# Patient Record
Sex: Male | Born: 1945 | Race: White | Hispanic: No | Smoking: Current some day smoker
Health system: Southern US, Community
[De-identification: ages and names within clinical notes are randomized; demographics above are authoritative.]

## PROBLEM LIST (undated history)

## (undated) DIAGNOSIS — Z940 Kidney transplant status: Secondary | ICD-10-CM

## (undated) DIAGNOSIS — E785 Hyperlipidemia, unspecified: Secondary | ICD-10-CM

## (undated) DIAGNOSIS — N189 Chronic kidney disease, unspecified: Secondary | ICD-10-CM

## (undated) DIAGNOSIS — I10 Essential (primary) hypertension: Secondary | ICD-10-CM

## (undated) DIAGNOSIS — E119 Type 2 diabetes mellitus without complications: Secondary | ICD-10-CM

## (undated) HISTORY — DX: Chronic kidney disease, unspecified: N18.9

## (undated) HISTORY — DX: Hyperlipidemia, unspecified: E78.5

## (undated) HISTORY — DX: Essential (primary) hypertension: I10

## (undated) HISTORY — DX: Type 2 diabetes mellitus without complications: E11.9

## (undated) HISTORY — PX: KIDNEY TRANSPLANT: SHX239

---

## 2003-06-11 ENCOUNTER — Ambulatory Visit (HOSPITAL_COMMUNITY): Admission: RE | Admit: 2003-06-11 | Discharge: 2003-06-12 | Payer: Self-pay | Admitting: Ophthalmology

## 2003-07-25 ENCOUNTER — Ambulatory Visit (HOSPITAL_COMMUNITY): Admission: RE | Admit: 2003-07-25 | Discharge: 2003-07-26 | Payer: Self-pay | Admitting: Ophthalmology

## 2004-07-07 ENCOUNTER — Inpatient Hospital Stay (HOSPITAL_COMMUNITY): Admission: EM | Admit: 2004-07-07 | Discharge: 2004-07-16 | Payer: Self-pay | Admitting: Emergency Medicine

## 2004-07-08 ENCOUNTER — Ambulatory Visit: Payer: Self-pay | Admitting: Cardiology

## 2004-08-10 ENCOUNTER — Ambulatory Visit (HOSPITAL_COMMUNITY): Admission: RE | Admit: 2004-08-10 | Discharge: 2004-08-11 | Payer: Self-pay | Admitting: Nephrology

## 2004-08-20 ENCOUNTER — Ambulatory Visit: Payer: Self-pay | Admitting: Cardiology

## 2004-12-21 ENCOUNTER — Ambulatory Visit (HOSPITAL_COMMUNITY): Admission: RE | Admit: 2004-12-21 | Discharge: 2004-12-21 | Payer: Self-pay | Admitting: Vascular Surgery

## 2005-07-30 ENCOUNTER — Inpatient Hospital Stay (HOSPITAL_COMMUNITY): Admission: EM | Admit: 2005-07-30 | Discharge: 2005-08-04 | Payer: Self-pay | Admitting: Emergency Medicine

## 2005-12-23 ENCOUNTER — Ambulatory Visit (HOSPITAL_COMMUNITY): Admission: RE | Admit: 2005-12-23 | Discharge: 2005-12-23 | Payer: Self-pay | Admitting: Nephrology

## 2006-05-12 ENCOUNTER — Ambulatory Visit (HOSPITAL_COMMUNITY): Admission: RE | Admit: 2006-05-12 | Discharge: 2006-05-12 | Payer: Self-pay | Admitting: Nephrology

## 2010-03-01 ENCOUNTER — Encounter: Payer: Self-pay | Admitting: Nephrology

## 2010-03-02 ENCOUNTER — Encounter: Payer: Self-pay | Admitting: Nephrology

## 2010-08-05 ENCOUNTER — Other Ambulatory Visit: Payer: Self-pay | Admitting: Dermatology

## 2010-08-05 DIAGNOSIS — C4492 Squamous cell carcinoma of skin, unspecified: Secondary | ICD-10-CM

## 2010-08-05 HISTORY — DX: Squamous cell carcinoma of skin, unspecified: C44.92

## 2011-03-24 DIAGNOSIS — Z7960 Long term (current) use of unspecified immunomodulators and immunosuppressants: Secondary | ICD-10-CM | POA: Insufficient documentation

## 2011-11-08 ENCOUNTER — Other Ambulatory Visit: Payer: Self-pay | Admitting: Dermatology

## 2012-03-30 DIAGNOSIS — B259 Cytomegaloviral disease, unspecified: Secondary | ICD-10-CM | POA: Insufficient documentation

## 2012-03-30 DIAGNOSIS — Z9289 Personal history of other medical treatment: Secondary | ICD-10-CM | POA: Insufficient documentation

## 2013-06-27 ENCOUNTER — Other Ambulatory Visit: Payer: Self-pay | Admitting: Dermatology

## 2013-06-27 DIAGNOSIS — C4491 Basal cell carcinoma of skin, unspecified: Secondary | ICD-10-CM

## 2013-06-27 HISTORY — DX: Basal cell carcinoma of skin, unspecified: C44.91

## 2013-08-16 ENCOUNTER — Other Ambulatory Visit: Payer: Self-pay | Admitting: Dermatology

## 2013-10-24 ENCOUNTER — Other Ambulatory Visit: Payer: Self-pay | Admitting: Dermatology

## 2014-07-01 ENCOUNTER — Other Ambulatory Visit: Payer: Self-pay | Admitting: Dermatology

## 2014-08-22 ENCOUNTER — Other Ambulatory Visit: Payer: Self-pay | Admitting: Dermatology

## 2014-10-10 ENCOUNTER — Other Ambulatory Visit: Payer: Self-pay | Admitting: Dermatology

## 2015-02-11 ENCOUNTER — Other Ambulatory Visit: Payer: Self-pay | Admitting: Dermatology

## 2015-03-13 ENCOUNTER — Other Ambulatory Visit: Payer: Self-pay | Admitting: Dermatology

## 2015-09-30 ENCOUNTER — Other Ambulatory Visit: Payer: Self-pay | Admitting: Dermatology

## 2015-10-07 DIAGNOSIS — Z79899 Other long term (current) drug therapy: Secondary | ICD-10-CM | POA: Insufficient documentation

## 2015-10-07 DIAGNOSIS — Z940 Kidney transplant status: Secondary | ICD-10-CM | POA: Insufficient documentation

## 2015-10-29 DIAGNOSIS — E1165 Type 2 diabetes mellitus with hyperglycemia: Secondary | ICD-10-CM | POA: Insufficient documentation

## 2015-11-13 ENCOUNTER — Other Ambulatory Visit: Payer: Self-pay | Admitting: Dermatology

## 2016-05-04 DIAGNOSIS — E1121 Type 2 diabetes mellitus with diabetic nephropathy: Secondary | ICD-10-CM | POA: Insufficient documentation

## 2016-06-14 ENCOUNTER — Other Ambulatory Visit: Payer: Self-pay | Admitting: Dermatology

## 2016-06-25 LAB — HM HEPATITIS C SCREENING LAB: HM Hepatitis Screen: NEGATIVE

## 2016-07-08 ENCOUNTER — Other Ambulatory Visit: Payer: Self-pay | Admitting: Dermatology

## 2017-02-22 ENCOUNTER — Other Ambulatory Visit: Payer: Self-pay | Admitting: Dermatology

## 2017-03-17 ENCOUNTER — Other Ambulatory Visit: Payer: Self-pay | Admitting: Dermatology

## 2018-04-11 ENCOUNTER — Other Ambulatory Visit: Payer: Self-pay | Admitting: Dermatology

## 2018-08-01 ENCOUNTER — Ambulatory Visit: Payer: Self-pay | Admitting: Family Medicine

## 2018-09-28 ENCOUNTER — Other Ambulatory Visit: Payer: Self-pay | Admitting: Dermatology

## 2018-10-09 ENCOUNTER — Ambulatory Visit: Payer: Medicare Other | Admitting: Family Medicine

## 2018-11-21 ENCOUNTER — Other Ambulatory Visit: Payer: Self-pay

## 2018-11-22 ENCOUNTER — Encounter: Payer: Self-pay | Admitting: Family Medicine

## 2018-11-22 ENCOUNTER — Ambulatory Visit (INDEPENDENT_AMBULATORY_CARE_PROVIDER_SITE_OTHER): Payer: Medicare Other | Admitting: Family Medicine

## 2018-11-22 VITALS — BP 138/66 | HR 81 | Temp 97.8°F | Ht 70.0 in | Wt 205.8 lb

## 2018-11-22 DIAGNOSIS — E782 Mixed hyperlipidemia: Secondary | ICD-10-CM

## 2018-11-22 DIAGNOSIS — H547 Unspecified visual loss: Secondary | ICD-10-CM | POA: Diagnosis not present

## 2018-11-22 DIAGNOSIS — I10 Essential (primary) hypertension: Secondary | ICD-10-CM

## 2018-11-22 DIAGNOSIS — E871 Hypo-osmolality and hyponatremia: Secondary | ICD-10-CM

## 2018-11-22 DIAGNOSIS — E119 Type 2 diabetes mellitus without complications: Secondary | ICD-10-CM | POA: Diagnosis not present

## 2018-11-22 DIAGNOSIS — Z23 Encounter for immunization: Secondary | ICD-10-CM

## 2018-11-22 DIAGNOSIS — J301 Allergic rhinitis due to pollen: Secondary | ICD-10-CM

## 2018-11-22 DIAGNOSIS — Z940 Kidney transplant status: Secondary | ICD-10-CM | POA: Insufficient documentation

## 2018-11-22 DIAGNOSIS — E785 Hyperlipidemia, unspecified: Secondary | ICD-10-CM | POA: Insufficient documentation

## 2018-11-22 DIAGNOSIS — Z794 Long term (current) use of insulin: Secondary | ICD-10-CM | POA: Insufficient documentation

## 2018-11-22 MED ORDER — FEXOFENADINE-PSEUDOEPHED ER 180-240 MG PO TB24: 1.0000 | ORAL_TABLET | Freq: Every evening | ORAL | 11 refills | Status: DC

## 2018-11-22 NOTE — Progress Notes (Signed)
Subjective:  Patient ID: Daniel Myers,  male    DOB: 07/17/1945  Age: 73 y.o.    CC: New Patient (Initial Visit) (Dr. Edrick Oh office )   HPI [name] presents for  follow-up of hypertension. Patient has no history of headache chest pain or shortness of breath or recent cough. Patient also denies symptoms of TIA such as numbness weakness lateralizing. Patient denies side effects from medication. States taking it regularly.  Renal transplant in 2008. Followed at [facility], Dr. Joseph Berkshire. Last chemistries showed Na was 130. Pt. Denies edema, dyspnea.  Patient also  in for follow-up of elevated cholesterol. Doing well without complaints on current medication. Denies side effects  including myalgia and arthralgia and nausea. Also in today for liver function testing. Currently no chest pain, shortness of breath or other cardiovascular related symptoms noted..   Follow-up of diabetes. Patient does check blood sugar at home. Readings run a little high lately. A1c per care everywhere was 7.9  On Sept. 3. Patient denies symptoms such as excessive hunger or urinary frequency, excessive hunger, nausea No significant hypoglycemic spells noted. Medications reviewed. Pt reports taking them regularly. Pt. denies complication/adverse reaction today.    History [name] has a past medical history of Chronic kidney disease, Diabetes mellitus without complication ([name]), Hyperlipidemia, and Hypertension.   He has a past surgical history that includes Kidney transplant (Right).   His family history includes Clotting disorder in his mother; Diabetes in his sister; Hypertension in his sister.He reports that he has been smoking pipe. He has never used smokeless tobacco. He reports that he does not drink alcohol or use drugs.  Current Outpatient Medications on File Prior to Visit  Medication Sig Dispense Refill  . amLODipine (NORVASC) 10 MG tablet Take 10 mg by mouth daily.    Marland Kitchen aspirin EC 81 MG tablet Take by  mouth.    . fluticasone (FLONASE) 50 MCG/ACT nasal spray USE ONE SPRAY(S) IN EACH NOSTRIL ONCE DAILY    . furosemide (LASIX) 40 MG tablet Take 40 mg by mouth daily.    . insulin aspart (NOVOLOG) 100 UNIT/ML injection Inject 15 Units into the skin 3 (three) times daily with meals.    . insulin glargine (LANTUS) 100 UNIT/ML injection Inject 60 Units into the skin.    Marland Kitchen loratadine (CLARITIN) 10 MG tablet Take by mouth.    . mycophenolate (CELLCEPT) 250 MG capsule TAKE 2 CAPSULES BY MOUTH TWO TIMES DAILY.    Marland Kitchen omega-3 acid ethyl esters (LOVAZA) 1 g capsule Take by mouth.    . pravastatin (PRAVACHOL) 80 MG tablet TAKE 1 TABLET BY MOUTH ONCE DAILY IN THE MORNING    . predniSONE (DELTASONE) 5 MG tablet Take 5 mg by mouth daily.    Marland Kitchen RELION INSULIN SYRINGE 31G X 15/64" 0.5 ML MISC USE AS DIRECTED FOR INSULIN    . sodium bicarbonate 650 MG tablet Take 650 mg by mouth 2 (two) times daily.    Marland Kitchen sulfamethoxazole-trimethoprim (BACTRIM) 400-80 MG tablet TAKE 1 TABLET BY MOUTH THREE TIMES A WEEK    . tacrolimus (PROGRAF) 0.5 MG capsule TAKE 2 CAPSULES BY MOUTH TWO TIMES DAILY.     No current facility-administered medications on file prior to visit.     ROS Review of Systems  Constitutional: Negative.   HENT: Negative.   Eyes: Positive for visual disturbance.  Respiratory: Negative for cough and shortness of breath.   Cardiovascular: Negative for chest pain and leg swelling.  Gastrointestinal: Negative for  abdominal pain, diarrhea, nausea and vomiting.  Genitourinary: Negative for difficulty urinating.  Musculoskeletal: Negative for arthralgias and myalgias.  Skin: Negative for rash.  Neurological: Negative for headaches.  Psychiatric/Behavioral: Negative for sleep disturbance.    Objective:  BP 138/66   Pulse 81   Temp 97.8 F (36.6 C) (Temporal)   Ht '5\' 10"'  (1.778 m)   Wt 205 lb 12.8 oz (93.4 kg)   SpO2 97%   BMI 29.53 kg/m   BP Readings from Last 3 Encounters:  11/22/18 138/66     Wt Readings from Last 3 Encounters:  11/22/18 205 lb 12.8 oz (93.4 kg)     Physical Exam Constitutional:      General: He is not in acute distress.    Appearance: He is well-developed.  HENT:     Head: Normocephalic and atraumatic.     Right Ear: External ear normal.     Left Ear: External ear normal.     Nose: Nose normal.  Eyes:     Conjunctiva/sclera: Conjunctivae normal.     Pupils: Pupils are equal, round, and reactive to light.  Neck:     Musculoskeletal: Normal range of motion and neck supple.  Cardiovascular:     Rate and Rhythm: Normal rate and regular rhythm.     Heart sounds: Normal heart sounds. No murmur.  Pulmonary:     Effort: Pulmonary effort is normal. No respiratory distress.     Breath sounds: Normal breath sounds. No wheezing or rales.  Abdominal:     Palpations: Abdomen is soft.     Tenderness: There is no abdominal tenderness.  Musculoskeletal: Normal range of motion.  Skin:    General: Skin is warm and dry.  Neurological:     Mental Status: He is alert and oriented to person, place, and time.     Deep Tendon Reflexes: Reflexes are normal and symmetric.  Psychiatric:        Behavior: Behavior normal.        Thought Content: Thought content normal.        Judgment: Judgment normal.     Diabetic Foot Exam - Simple   No data filed        Assessment & Plan:   Unique was seen today for new patient (initial visit).  Diagnoses and all orders for this visit:  Insulin dependent type 2 diabetes mellitus ([place])  Need for immunization against influenza -     Flu Vaccine QUAD High Dose(Fluad)  Essential hypertension  Renal transplant recipient  Mixed hyperlipidemia  Vision decreased  Hyponatremia -     BMP8+EGFR -     Osmolality  Seasonal allergic rhinitis due to pollen  Other orders -     fexofenadine-pseudoephedrine (ALLEGRA-D 24) 180-240 MG 24 hr tablet; Take 1 tablet by mouth every evening. For allergy and congestion   I am  having [name] start on fexofenadine-pseudoephedrine. I am also having him maintain his tacrolimus, sulfamethoxazole-trimethoprim, sodium bicarbonate, predniSONE, pravastatin, mycophenolate, insulin glargine, furosemide, aspirin EC, amLODipine, fluticasone, ReliOn Insulin Syringe, loratadine, omega-3 acid ethyl esters, and insulin aspart.  Meds ordered this encounter  Medications  . fexofenadine-pseudoephedrine (ALLEGRA-D 24) 180-240 MG 24 hr tablet    Sig: Take 1 tablet by mouth every evening. For allergy and congestion    Dispense:  30 tablet    Refill:  11     Follow-up: Return in about 2 months (around 01/22/2019).  Claretta Fraise, M.D.

## 2018-11-23 LAB — BMP8+EGFR
BUN/Creatinine Ratio: 19 (ref 10–24)
BUN: 26 mg/dL (ref 8–27)
CO2: 21 mmol/L (ref 20–29)
Calcium: 9 mg/dL (ref 8.6–10.2)
Chloride: 95 mmol/L — ABNORMAL LOW (ref 96–106)
Creatinine, Ser: 1.4 mg/dL — ABNORMAL HIGH (ref 0.76–1.27)
GFR calc Af Amer: 58 mL/min/{1.73_m2} — ABNORMAL LOW (ref >59)
GFR calc non Af Amer: 50 mL/min/{1.73_m2} — ABNORMAL LOW (ref >59)
Glucose: 335 mg/dL — ABNORMAL HIGH (ref 65–99)
Potassium: 4 mmol/L (ref 3.5–5.2)
Sodium: 132 mmol/L — ABNORMAL LOW (ref 134–144)

## 2018-11-23 LAB — OSMOLALITY: Osmolality Meas: 297 mOsmol/kg (ref 280–301)

## 2019-01-19 ENCOUNTER — Other Ambulatory Visit: Payer: Self-pay

## 2019-01-22 ENCOUNTER — Encounter: Payer: Self-pay | Admitting: Family Medicine

## 2019-01-22 ENCOUNTER — Ambulatory Visit (INDEPENDENT_AMBULATORY_CARE_PROVIDER_SITE_OTHER): Payer: Medicare Other | Admitting: Family Medicine

## 2019-01-22 DIAGNOSIS — Z794 Long term (current) use of insulin: Secondary | ICD-10-CM

## 2019-01-22 DIAGNOSIS — E119 Type 2 diabetes mellitus without complications: Secondary | ICD-10-CM

## 2019-01-22 DIAGNOSIS — I10 Essential (primary) hypertension: Secondary | ICD-10-CM | POA: Diagnosis not present

## 2019-01-22 MED ORDER — INSULIN REGULAR HUMAN 100 UNIT/ML IJ SOPN: 10.0000 [IU] | PEN_INJECTOR | Freq: Two times a day (BID) | INTRAMUSCULAR | 99 refills | Status: DC

## 2019-01-22 MED ORDER — INSULIN ASPART 100 UNIT/ML ~~LOC~~ SOLN: SUBCUTANEOUS | 10 refills | Status: DC

## 2019-01-22 MED ORDER — INSULIN NPH (HUMAN) (ISOPHANE) 100 UNIT/ML ~~LOC~~ SUSP: SUBCUTANEOUS | 11 refills | Status: DC

## 2019-01-22 NOTE — Progress Notes (Signed)
Subjective:    Patient ID: Daniel Myers, male    DOB: Oct 02, 1945, 73 y.o.   MRN: [id number]   HPI: Daniel Myers is a 73 y.o. male presenting for presents forFollow-up of diabetes. Patient checks blood sugar at home.  70-125 fasting and 215-300 postprandial. Will get to low if he goes over 60 units at bedtime. No longer taking lantus due to price. Instead taking NPH at bedtime. HE also uses regular insulin from [employer] 10 units AC, breakfast and supper with prn use at lunchtime.  Patient denies symptoms such as polyuria, polydipsia, excessive hunger, nausea No significant hypoglycemic spells noted. Medications reviewed. Pt reports taking them regularly without complication/adverse reaction being reported today.      No flowsheet data found.   Relevant past medical, surgical, family and social history reviewed and updated as indicated.  Interim medical history since our last visit reviewed. Allergies and medications reviewed and updated.  ROS:  Review of Systems  Constitutional: Negative for fever.  Respiratory: Negative for shortness of breath.   Cardiovascular: Negative for chest pain.  Musculoskeletal: Positive for arthralgias.  Skin: Negative for rash.     Social History   Tobacco Use  Smoking Status Current Some Day Smoker  . Types: Pipe  Smokeless Tobacco Never Used       Objective:     Wt Readings from Last 3 Encounters:  11/22/18 205 lb 12.8 oz (93.4 kg)     Exam deferred. Pt. Harboring due to COVID 19. Phone visit performed.   Assessment & Plan:   1. Insulin dependent type 2 diabetes mellitus ([place])   2. Essential hypertension     Meds ordered this encounter  Medications  . DISCONTD: insulin aspart (NOVOLOG) 100 UNIT/ML injection    Sig: 10 units AC breakfast and supper. Also PRN at lunch if over 200    Dispense:  10 mL    Refill:  10  . insulin NPH Human (NOVOLIN N) 100 UNIT/ML injection    Sig: 30 units sq AC breakfast, 40 units sq ac supper     Dispense:  30 mL    Refill:  11  . Insulin Regular Human 100 UNIT/ML SOPN    Sig: Inject 10 Units as directed 2 (two) times daily before lunch and supper. r. Also PRN at lunch if glucose is over 200    Dispense:  10 mL    Refill:  prn    Check P at home. Goal of 135/85 or below.    Diagnoses and all orders for this visit:  Insulin dependent type 2 diabetes mellitus ([place])  Essential hypertension  Other orders -     Discontinue: insulin aspart (NOVOLOG) 100 UNIT/ML injection; 10 units AC breakfast and supper. Also PRN at lunch if over 200 -     insulin NPH Human (NOVOLIN N) 100 UNIT/ML injection; 30 units sq AC breakfast, 40 units sq ac supper -     Insulin Regular Human 100 UNIT/ML SOPN; Inject 10 Units as directed 2 (two) times daily before lunch and supper. r. Also PRN at lunch if glucose is over 200    Virtual Visit via telephone Note  I discussed the limitations, risks, security and privacy concerns of performing an evaluation and management service by telephone and the availability of in person appointments. The patient was identified with two identifiers. Pt.expressed understanding and agreed to proceed. Pt. Is at home. Dr. Livia Snellen is in his office.  Follow Up Instructions:   I  discussed the assessment and treatment plan with the patient. The patient was provided an opportunity to ask questions and all were answered. The patient agreed with the plan and demonstrated an understanding of the instructions.   The patient was advised to call back or seek an in-person evaluation if the symptoms worsen or if the condition fails to improve as anticipated.   Total minutes including chart review and phone contact time: 21   Follow up plan: Return in about 1 month (around 02/22/2019).  Claretta Fraise, MD [place]

## 2019-03-13 ENCOUNTER — Other Ambulatory Visit: Payer: Self-pay

## 2019-03-14 ENCOUNTER — Ambulatory Visit (INDEPENDENT_AMBULATORY_CARE_PROVIDER_SITE_OTHER): Payer: Medicare Other | Admitting: Family Medicine

## 2019-03-14 ENCOUNTER — Encounter: Payer: Self-pay | Admitting: Family Medicine

## 2019-03-14 VITALS — BP 146/52 | Temp 97.0°F | Ht 70.0 in | Wt 202.0 lb

## 2019-03-14 DIAGNOSIS — E871 Hypo-osmolality and hyponatremia: Secondary | ICD-10-CM

## 2019-03-14 DIAGNOSIS — I10 Essential (primary) hypertension: Secondary | ICD-10-CM

## 2019-03-14 DIAGNOSIS — Z794 Long term (current) use of insulin: Secondary | ICD-10-CM

## 2019-03-14 DIAGNOSIS — E119 Type 2 diabetes mellitus without complications: Secondary | ICD-10-CM

## 2019-03-14 DIAGNOSIS — E782 Mixed hyperlipidemia: Secondary | ICD-10-CM | POA: Diagnosis not present

## 2019-03-14 DIAGNOSIS — Z940 Kidney transplant status: Secondary | ICD-10-CM

## 2019-03-14 DIAGNOSIS — J301 Allergic rhinitis due to pollen: Secondary | ICD-10-CM

## 2019-03-14 LAB — BAYER DCA HB A1C WAIVED: HB A1C (BAYER DCA - WAIVED): 6.4 % (ref <7.0)

## 2019-03-14 MED ORDER — PRAVASTATIN SODIUM 80 MG PO TABS: ORAL_TABLET | ORAL | 3 refills | Status: DC

## 2019-03-14 MED ORDER — AMLODIPINE BESYLATE 10 MG PO TABS: 10.0000 mg | ORAL_TABLET | Freq: Every day | ORAL | 1 refills | Status: DC

## 2019-03-14 MED ORDER — INSULIN NPH (HUMAN) (ISOPHANE) 100 UNIT/ML ~~LOC~~ SUSP: SUBCUTANEOUS | 11 refills | Status: DC

## 2019-03-14 MED ORDER — OMEGA-3-ACID ETHYL ESTERS 1 G PO CAPS: 2.0000 g | ORAL_CAPSULE | Freq: Two times a day (BID) | ORAL | 3 refills | Status: DC

## 2019-03-14 MED ORDER — INSULIN REGULAR HUMAN 100 UNIT/ML IJ SOPN: 10.0000 [IU] | PEN_INJECTOR | Freq: Two times a day (BID) | INTRAMUSCULAR | 99 refills | Status: DC

## 2019-03-14 MED ORDER — FEXOFENADINE-PSEUDOEPHED ER 180-240 MG PO TB24: 1.0000 | ORAL_TABLET | Freq: Every evening | ORAL | 11 refills | Status: DC

## 2019-03-14 MED ORDER — FLUTICASONE PROPIONATE 50 MCG/ACT NA SUSP: NASAL | 11 refills | Status: DC

## 2019-03-14 NOTE — Progress Notes (Signed)
Subjective:  Patient ID: Daniel Myers,  male    DOB: [birth year]  Age: 74 y.o.    CC: Follow-up   HPI DARIUSZ BRASE presents for  follow-up of hypertension. Patient has no history of headache chest pain or shortness of breath or recent cough. Patient also denies symptoms of TIA such as numbness weakness lateralizing. Patient denies side effects from medication. States taking it regularly.  Patient also  in for follow-up of elevated cholesterol. Doing well without complaints on current medication. Denies side effects  including myalgia and arthralgia and nausea. Also in today for liver function testing. Currently no chest pain, shortness of breath or other cardiovascular related symptoms noted.  Follow-up of diabetes. Patient does check blood sugar at home. LOgs show 90-120 for fasting and 150-200 for PP. Sometimes higher, occasioanlly to 300 at bedtime. Patient denies symptoms such as excessive hunger or urinary frequency, excessive hunger, nausea No significant hypoglycemic spells noted. Medications reviewed. Pt reports taking them regularly. Pt. denies complication/adverse reaction today.    History Zakariye has a past medical history of Chronic kidney disease, Diabetes mellitus without complication ([place]), Hyperlipidemia, and Hypertension.   He has a past surgical history that includes Kidney transplant (Right).   His family history includes Clotting disorder in his mother; Diabetes in his sister; Hypertension in his sister.He reports that he has been smoking pipe. He has never used smokeless tobacco. He reports that he does not drink alcohol or use drugs.  Current Outpatient Medications on File Prior to Visit  Medication Sig Dispense Refill  . aspirin EC 81 MG tablet Take by mouth.    . furosemide (LASIX) 40 MG tablet Take 40 mg by mouth daily.    Marland Kitchen loratadine (CLARITIN) 10 MG tablet Take by mouth.    . mycophenolate (CELLCEPT) 250 MG capsule TAKE 2 CAPSULES BY MOUTH TWO TIMES  DAILY.    Marland Kitchen predniSONE (DELTASONE) 5 MG tablet Take 5 mg by mouth daily.    Marland Kitchen RELION INSULIN SYRINGE 31G X 15/64" 0.5 ML MISC USE AS DIRECTED FOR INSULIN    . sodium bicarbonate 650 MG tablet Take 650 mg by mouth 2 (two) times daily.    Marland Kitchen sulfamethoxazole-trimethoprim (BACTRIM) 400-80 MG tablet TAKE 1 TABLET BY MOUTH THREE TIMES A WEEK    . tacrolimus (PROGRAF) 0.5 MG capsule TAKE 2 CAPSULES BY MOUTH TWO TIMES DAILY.     No current facility-administered medications on file prior to visit.    ROS Review of Systems  Constitutional: Negative.   HENT: Negative.   Eyes: Negative for visual disturbance.  Respiratory: Negative for cough and shortness of breath.   Cardiovascular: Negative for chest pain and leg swelling.  Gastrointestinal: Negative for abdominal pain, diarrhea, nausea and vomiting.  Genitourinary: Negative for difficulty urinating.  Musculoskeletal: Negative for arthralgias and myalgias.  Skin: Negative for rash.  Neurological: Negative for headaches.  Psychiatric/Behavioral: Negative for sleep disturbance.    Objective:  BP (!) 146/52   Temp (!) 97 F (36.1 C) (Temporal)   Ht '5\' 10"'  (1.778 m)   Wt 202 lb (91.6 kg)   BMI 28.98 kg/m   BP Readings from Last 3 Encounters:  03/14/19 (!) 146/52  11/22/18 138/66    Wt Readings from Last 3 Encounters:  03/14/19 202 lb (91.6 kg)  11/22/18 205 lb 12.8 oz (93.4 kg)     Physical Exam Constitutional:      General: He is not in acute distress.    Appearance: He is  well-developed.  HENT:     Head: Normocephalic and atraumatic.     Right Ear: External ear normal.     Left Ear: External ear normal.     Nose: Nose normal.  Eyes:     Conjunctiva/sclera: Conjunctivae normal.     Pupils: Pupils are equal, round, and reactive to light.  Cardiovascular:     Rate and Rhythm: Normal rate and regular rhythm.     Heart sounds: Normal heart sounds. No murmur.  Pulmonary:     Effort: Pulmonary effort is normal. No  respiratory distress.     Breath sounds: Normal breath sounds. No wheezing or rales.  Abdominal:     Palpations: Abdomen is soft.     Tenderness: There is no abdominal tenderness.  Musculoskeletal:        General: Normal range of motion.     Cervical back: Normal range of motion and neck supple.  Skin:    General: Skin is warm and dry.  Neurological:     Mental Status: He is alert and oriented to person, place, and time.     Deep Tendon Reflexes: Reflexes are normal and symmetric.  Psychiatric:        Behavior: Behavior normal.        Thought Content: Thought content normal.        Judgment: Judgment normal.     Diabetic Foot Exam - Simple   No data filed        Assessment & Plan:   Eugune was seen today for follow-up.  Diagnoses and all orders for this visit:  Insulin dependent type 2 diabetes mellitus ([place]) -     Bayer DCA Hb A1c Waived -     CBC with Differential/Platelet -     CMP14+EGFR -     Microalbumin / creatinine urine ratio -     insulin NPH Human (NOVOLIN N) 100 UNIT/ML injection; 30 units sq AC breakfast, 40 units sq ac supper -     Insulin Regular Human 100 UNIT/ML SOPN; Inject 10 Units as directed 2 (two) times daily before lunch and supper. Inject 10 units before lunch and 13 units before supper. -     pravastatin (PRAVACHOL) 80 MG tablet; TAKE 1 TABLET BY MOUTH ONCE DAILY IN THE MORNING  Essential hypertension -     Bayer DCA Hb A1c Waived -     CBC with Differential/Platelet -     CMP14+EGFR -     Microalbumin / creatinine urine ratio -     amLODipine (NORVASC) 10 MG tablet; Take 1 tablet (10 mg total) by mouth daily.  Mixed hyperlipidemia -     Bayer DCA Hb A1c Waived -     CBC with Differential/Platelet -     CMP14+EGFR -     Lipid panel -     omega-3 acid ethyl esters (LOVAZA) 1 g capsule; Take 2 capsules (2 g total) by mouth 2 (two) times daily. -     pravastatin (PRAVACHOL) 80 MG tablet; TAKE 1 TABLET BY MOUTH ONCE DAILY IN THE  MORNING  Hyponatremia -     CBC with Differential/Platelet -     CMP14+EGFR  Renal transplant recipient  Seasonal allergic rhinitis due to pollen -     fexofenadine-pseudoephedrine (ALLEGRA-D 24) 180-240 MG 24 hr tablet; Take 1 tablet by mouth every evening. For allergy and congestion -     fluticasone (FLONASE) 50 MCG/ACT nasal spray; USE ONE SPRAY(S) IN EACH NOSTRIL ONCE DAILY  I have changed Lesean L. Servidio's amLODipine, Insulin Regular Human, and omega-3 acid ethyl esters. I am also having him maintain his tacrolimus, sulfamethoxazole-trimethoprim, sodium bicarbonate, predniSONE, mycophenolate, furosemide, aspirin EC, ReliOn Insulin Syringe, loratadine, fexofenadine-pseudoephedrine, fluticasone, insulin NPH Human, and pravastatin.  Meds ordered this encounter  Medications  . amLODipine (NORVASC) 10 MG tablet    Sig: Take 1 tablet (10 mg total) by mouth daily.    Dispense:  90 tablet    Refill:  1  . fexofenadine-pseudoephedrine (ALLEGRA-D 24) 180-240 MG 24 hr tablet    Sig: Take 1 tablet by mouth every evening. For allergy and congestion    Dispense:  30 tablet    Refill:  11  . fluticasone (FLONASE) 50 MCG/ACT nasal spray    Sig: USE ONE SPRAY(S) IN EACH NOSTRIL ONCE DAILY    Dispense:  16 g    Refill:  11  . insulin NPH Human (NOVOLIN N) 100 UNIT/ML injection    Sig: 30 units sq AC breakfast, 40 units sq ac supper    Dispense:  30 mL    Refill:  11  . Insulin Regular Human 100 UNIT/ML SOPN    Sig: Inject 10 Units as directed 2 (two) times daily before lunch and supper. Inject 10 units before lunch and 13 units before supper.    Dispense:  10 mL    Refill:  prn  . omega-3 acid ethyl esters (LOVAZA) 1 g capsule    Sig: Take 2 capsules (2 g total) by mouth 2 (two) times daily.    Dispense:  360 capsule    Refill:  3  . pravastatin (PRAVACHOL) 80 MG tablet    Sig: TAKE 1 TABLET BY MOUTH ONCE DAILY IN THE MORNING    Dispense:  90 tablet    Refill:  3   Patient's  diabetes looks excellent based on his A1c.  However his log sheet shows a significant number of elevated postprandial sugars.  Particularly at bedtime his sugars can range from 200-2 50 occasionally peaking at 300.  Occasionally down in the mid 100s.  However there better low enough to precaution.  Those logs are.  Additionally the patient is a renal transplant recipient.  He takes various medications 1 of which is prednisone which is certainly complicating his diabetes.  However the dose is low.  He also takes CellCept and Prograf.  Each of these are regulated by his transplant specialist team.  Follow-up: Return in about 3 months (around 06/11/2019).  Claretta Fraise, M.D.

## 2019-03-15 LAB — CMP14+EGFR
ALT: 19 IU/L (ref 0–44)
AST: 27 IU/L (ref 0–40)
Albumin/Globulin Ratio: 2.3 — ABNORMAL HIGH (ref 1.2–2.2)
Albumin: 4.9 g/dL — ABNORMAL HIGH (ref 3.7–4.7)
Alkaline Phosphatase: 67 IU/L (ref 39–117)
BUN/Creatinine Ratio: 16 (ref 10–24)
BUN: 24 mg/dL (ref 8–27)
Bilirubin Total: 0.7 mg/dL (ref 0.0–1.2)
CO2: 24 mmol/L (ref 20–29)
Calcium: 9.7 mg/dL (ref 8.6–10.2)
Chloride: 96 mmol/L (ref 96–106)
Creatinine, Ser: 1.52 mg/dL — ABNORMAL HIGH (ref 0.76–1.27)
GFR calc Af Amer: 52 mL/min/{1.73_m2} — ABNORMAL LOW (ref >59)
GFR calc non Af Amer: 45 mL/min/{1.73_m2} — ABNORMAL LOW (ref >59)
Globulin, Total: 2.1 g/dL (ref 1.5–4.5)
Glucose: 185 mg/dL — ABNORMAL HIGH (ref 65–99)
Potassium: 4.2 mmol/L (ref 3.5–5.2)
Sodium: 138 mmol/L (ref 134–144)
Total Protein: 7 g/dL (ref 6.0–8.5)

## 2019-03-15 LAB — CBC WITH DIFFERENTIAL/PLATELET
Basophils Absolute: 0 x10E3/uL (ref 0.0–0.2)
Basos: 1 %
EOS (ABSOLUTE): 0.2 x10E3/uL (ref 0.0–0.4)
Eos: 2 %
Hematocrit: 54 % — ABNORMAL HIGH (ref 37.5–51.0)
Hemoglobin: 18.4 g/dL — ABNORMAL HIGH (ref 13.0–17.7)
Immature Grans (Abs): 0.1 x10E3/uL (ref 0.0–0.1)
Immature Granulocytes: 1 %
Lymphocytes Absolute: 1.3 x10E3/uL (ref 0.7–3.1)
Lymphs: 15 %
MCH: 29.5 pg (ref 26.6–33.0)
MCHC: 34.1 g/dL (ref 31.5–35.7)
MCV: 87 fL (ref 79–97)
Monocytes Absolute: 0.4 x10E3/uL (ref 0.1–0.9)
Monocytes: 5 %
Neutrophils Absolute: 6.6 x10E3/uL (ref 1.4–7.0)
Neutrophils: 76 %
Platelets: 159 x10E3/uL (ref 150–450)
RBC: 6.23 x10E6/uL — ABNORMAL HIGH (ref 4.14–5.80)
RDW: 13.6 % (ref 11.6–15.4)
WBC: 8.5 x10E3/uL (ref 3.4–10.8)

## 2019-03-15 LAB — LIPID PANEL
Chol/HDL Ratio: 7.6 ratio — ABNORMAL HIGH (ref 0.0–5.0)
Cholesterol, Total: 242 mg/dL — ABNORMAL HIGH (ref 100–199)
HDL: 32 mg/dL — ABNORMAL LOW (ref >39)
LDL Chol Calc (NIH): 146 mg/dL — ABNORMAL HIGH (ref 0–99)
Triglycerides: 346 mg/dL — ABNORMAL HIGH (ref 0–149)
VLDL Cholesterol Cal: 64 mg/dL — ABNORMAL HIGH (ref 5–40)

## 2019-03-15 LAB — MICROALBUMIN / CREATININE URINE RATIO
Creatinine, Urine: 14.9 mg/dL
Microalb/Creat Ratio: 574 mg/g{creat} — ABNORMAL HIGH (ref 0–29)
Microalbumin, Urine: 85.5 ug/mL

## 2019-03-15 LAB — SPECIMEN STATUS REPORT

## 2019-03-18 ENCOUNTER — Other Ambulatory Visit: Payer: Self-pay | Admitting: Family Medicine

## 2019-03-21 ENCOUNTER — Other Ambulatory Visit: Payer: Self-pay | Admitting: Dermatology

## 2019-05-01 ENCOUNTER — Telehealth: Payer: Self-pay

## 2019-05-01 NOTE — Telephone Encounter (Signed)
Patient returned call and I made him aware that he needed a 30 min surgery with Dr. Denna Haggard.  Patient said he would call back to schedule.

## 2019-05-01 NOTE — Telephone Encounter (Signed)
Left voicemail for patient to call because we need to schedule him for a 55minute surgery with Dr. Denna Haggard for [facility] on right neck.

## 2019-05-07 ENCOUNTER — Telehealth: Payer: Self-pay | Admitting: *Deleted

## 2019-05-07 NOTE — Telephone Encounter (Signed)
Phone call to patient daughter [name]) to make sure patient knew about [facility] appointment on May 23, 2019 at 1000 with [place].  Informed [name] that patient also needs surgery appointment with Dr. 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 Surgery appointment made Jun 28, 2019 at 1000 with Dr. Denna Haggard.

## 2019-06-27 ENCOUNTER — Ambulatory Visit (INDEPENDENT_AMBULATORY_CARE_PROVIDER_SITE_OTHER): Payer: Medicare Other | Admitting: Family Medicine

## 2019-06-27 ENCOUNTER — Encounter: Payer: Self-pay | Admitting: Family Medicine

## 2019-06-27 ENCOUNTER — Other Ambulatory Visit: Payer: Self-pay

## 2019-06-27 VITALS — BP 135/75 | HR 71 | Temp 97.5°F | Resp 20 | Ht 70.0 in | Wt 210.4 lb

## 2019-06-27 DIAGNOSIS — I10 Essential (primary) hypertension: Secondary | ICD-10-CM | POA: Diagnosis not present

## 2019-06-27 DIAGNOSIS — E782 Mixed hyperlipidemia: Secondary | ICD-10-CM

## 2019-06-27 DIAGNOSIS — E119 Type 2 diabetes mellitus without complications: Secondary | ICD-10-CM

## 2019-06-27 DIAGNOSIS — Z940 Kidney transplant status: Secondary | ICD-10-CM

## 2019-06-27 DIAGNOSIS — Z794 Long term (current) use of insulin: Secondary | ICD-10-CM | POA: Diagnosis not present

## 2019-06-27 LAB — BAYER DCA HB A1C WAIVED: HB A1C (BAYER DCA - WAIVED): 7.6 % — ABNORMAL HIGH (ref <7.0)

## 2019-06-27 NOTE — Progress Notes (Signed)
Subjective:  Patient ID: Daniel Myers, male    DOB: 05-Oct-1945  Age: 74 y.o. MRN: [id number]  CC: Medical Management of Chronic Issues   HPI Daniel Myers presents forFollow-up of diabetes. Patient checks blood sugar at home.  Fasting is staying under 125 most of the time.  No postprandials available. Patient denies symptoms such as polyuria, polydipsia, excessive hunger, nausea No significant hypoglycemic spells noted. Medications reviewed. Pt reports taking them regularly without complication/adverse reaction being reported today.     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), Squamous cell carcinoma of skin (11/08/2011), Squamous cell carcinoma of skin (11/08/2011), Squamous cell carcinoma of skin (11/08/2011), 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), 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), 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), 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), 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), 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), 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), 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), Squamous cell carcinoma of skin (09/30/2015), Squamous cell carcinoma of skin (02/22/2017), Squamous cell carcinoma of skin (02/22/2017), Squamous cell carcinoma of skin (04/11/2018), Squamous cell carcinoma of skin (04/11/2018), Squamous cell carcinoma of skin (04/11/2018), Squamous cell carcinoma of skin (04/11/2018), Squamous cell carcinoma of  skin (04/11/2018), Squamous cell carcinoma of skin (09/28/2018), Squamous cell carcinoma of skin (09/28/2018), Squamous cell carcinoma of skin (09/28/2018), Squamous cell carcinoma of skin (03/21/2019), Squamous cell carcinoma of skin (03/21/2019), and Squamous cell carcinoma of skin (03/21/2019).   He has a past surgical history that includes Kidney transplant (Right).   His family history includes Clotting disorder in his mother; Diabetes in his sister; Hypertension in his sister.He reports that he has been smoking pipe. He has never used smokeless tobacco. He reports that he does not drink alcohol or use drugs.  Current Outpatient Medications on File Prior to Visit  Medication Sig Dispense Refill  . amLODipine (NORVASC) 10 MG tablet Take 1 tablet (10 mg total) by mouth daily. 90 tablet 1  . aspirin EC 81 MG tablet Take by mouth.    . fexofenadine-pseudoephedrine (ALLEGRA-D 24) 180-240 MG 24 hr tablet Take 1 tablet by mouth every evening. For allergy and congestion 30 tablet 11  . fluticasone (FLONASE) 50 MCG/ACT nasal spray USE ONE SPRAY(S) IN EACH NOSTRIL ONCE DAILY 16 g 11  . furosemide (LASIX) 40 MG tablet Take 40 mg by mouth daily.    . insulin NPH Human (NOVOLIN N) 100 UNIT/ML injection 30 units sq AC breakfast, 40 units sq ac supper 30 mL 11  . Insulin Regular Human 100 UNIT/ML SOPN Inject 10 Units as directed 2 (two) times daily before lunch and supper. Inject 10 units before lunch and 13 units before supper. 10 mL prn  . loratadine (CLARITIN) 10 MG tablet Take by mouth.    . mycophenolate (CELLCEPT) 250 MG capsule TAKE 2 CAPSULES BY MOUTH TWO TIMES DAILY.    Marland Kitchen omega-3 acid ethyl esters (LOVAZA) 1 g capsule Take 2 capsules (2 g total) by mouth 2 (two) times daily. 360 capsule 3  . pravastatin (PRAVACHOL) 80 MG tablet TAKE 1 TABLET BY MOUTH ONCE DAILY IN THE MORNING 90 tablet 3  . predniSONE (DELTASONE) 5  MG tablet Take 5 mg by mouth daily.    Marland Kitchen RELION INSULIN SYRINGE 31G X 15/64" 0.5 ML  MISC USE AS DIRECTED FOR INSULIN    . sodium bicarbonate 650 MG tablet Take 650 mg by mouth 2 (two) times daily.    Marland Kitchen sulfamethoxazole-trimethoprim (BACTRIM) 400-80 MG tablet TAKE 1 TABLET BY MOUTH THREE TIMES A WEEK    . tacrolimus (PROGRAF) 0.5 MG capsule TAKE 2 CAPSULES BY MOUTH TWO TIMES DAILY.     No current facility-administered medications on file prior to visit.    ROS Review of Systems  Constitutional: Negative.   HENT: Negative.   Eyes: Negative for visual disturbance.  Respiratory: Negative for cough and shortness of breath.   Cardiovascular: Negative for chest pain and leg swelling.  Gastrointestinal: Negative for abdominal pain, diarrhea, nausea and vomiting.  Genitourinary: Negative for difficulty urinating.  Musculoskeletal: Negative for arthralgias and myalgias.  Skin: Negative for rash.  Neurological: Negative for headaches.  Psychiatric/Behavioral: Negative for sleep disturbance.    Objective:  BP 135/75   Pulse 71   Temp (!) 97.5 F (36.4 C) (Temporal)   Resp 20   Ht _0  (1.778 m)   Wt 210 lb 6 oz (95.4 kg)   SpO2 99%   BMI 30.19 kg/m   BP Readings from Last 3 Encounters:  06/27/19 135/75  03/14/19 (!) 146/52  11/22/18 138/66    Wt Readings from Last 3 Encounters:  06/27/19 210 lb 6 oz (95.4 kg)  03/14/19 202 lb (91.6 kg)  11/22/18 205 lb 12.8 oz (93.4 kg)     Physical Exam Constitutional:      General: He is not in acute distress.    Appearance: He is well-developed.  HENT:     Head: Normocephalic and atraumatic.     Right Ear: External ear normal.     Left Ear: External ear normal.     Nose: Nose normal.  Eyes:     Conjunctiva/sclera: Conjunctivae normal.     Pupils: Pupils are equal, round, and reactive to light.  Cardiovascular:     Rate and Rhythm: Normal rate and regular rhythm.     Heart sounds: Normal heart sounds. No murmur.  Pulmonary:     Effort: Pulmonary effort is normal. No respiratory distress.     Breath sounds:  Normal breath sounds. No wheezing or rales.  Abdominal:     Palpations: Abdomen is soft.     Tenderness: There is no abdominal tenderness.  Musculoskeletal:        General: Normal range of motion.     Cervical back: Normal range of motion and neck supple.  Skin:    General: Skin is warm and dry.  Neurological:     Mental Status: He is alert and oriented to person, place, and time.     Deep Tendon Reflexes: Reflexes are normal and symmetric.  Psychiatric:        Behavior: Behavior normal.        Thought Content: Thought content normal.        Judgment: Judgment normal.       Assessment & Plan:   Vishaal was seen today for medical management of chronic issues.  Diagnoses and all orders for this visit:  Insulin dependent type 2 diabetes mellitus ([place]) -     Bayer DCA Hb A1c Waived -     CBC with Differential/Platelet -     CMP14+EGFR -     Lipid panel  Essential hypertension -  CBC with Differential/Platelet -     CMP14+EGFR -     Lipid panel  Mixed hyperlipidemia -     CBC with Differential/Platelet -     CMP14+EGFR -     Lipid panel  Renal transplant recipient    We discussed at length pros and cons of Covid vaccination.  He has not had it yet.  However he does take immunosuppressive drugs for his renal transplant.  He has a transplant physician he keeps in touch with at [facility].  After discussion I recommended that he would likely need the shot but that he should run that by his transplant specialist first.  We will abide by his decision.  With regard to diabetes care we discussed diet exercise as being the most useful for him based on his current blood sugars and his A1c of 7.6.  As a result of his use of prednisone, the A1c today of 7.6 might be reasonable to use as a goal.  I do not think at this time for the medicine would  I am having Daniel Myers maintain his tacrolimus, sulfamethoxazole-trimethoprim, sodium bicarbonate,  predniSONE, mycophenolate, furosemide, aspirin EC, ReliOn Insulin Syringe, loratadine, amLODipine, fexofenadine-pseudoephedrine, fluticasone, insulin NPH Human, Insulin Regular Human, omega-3 acid ethyl esters, and pravastatin.  No orders of the defined types were placed in this encounter.    Follow-up: Return in about 3 months (around 09/27/2019).  Claretta Fraise, M.D.

## 2019-06-27 NOTE — Patient Instructions (Addendum)
Remember to call and get your transplant doctor's opinion about CoVID Vaccine for you.   Diabetes Mellitus and Exercise Exercising regularly is important for your overall health, especially when you have diabetes (diabetes mellitus). Exercising is not only about losing weight. It has many other health benefits, such as increasing muscle strength and bone density and reducing body fat and stress. This leads to improved fitness, flexibility, and endurance, all of which result in better overall health. Exercise has additional benefits for people with diabetes, including: Reducing appetite. Helping to lower and control blood glucose. Lowering blood pressure. Helping to control amounts of fatty substances (lipids) in the blood, such as cholesterol and triglycerides. Helping the body to respond better to insulin (improving insulin sensitivity). Reducing how much insulin the body needs. Decreasing the risk for heart disease by: Lowering cholesterol and triglyceride levels. Increasing the levels of good cholesterol. Lowering blood glucose levels. What is my activity plan? Your health care provider or certified diabetes educator can help you make a plan for the type and frequency of exercise (activity plan) that works for you. Make sure that you: Do at least 150 minutes of moderate-intensity or vigorous-intensity exercise each week. This could be brisk walking, biking, or water aerobics. Do stretching and strength exercises, such as yoga or weightlifting, at least 2 times a week. Spread out your activity over at least 3 days of the week. Get some form of physical activity every day. Do not go more than 2 days in a row without some kind of physical activity. Avoid being inactive for more than 30 minutes at a time. Take frequent breaks to walk or stretch. Choose a type of exercise or activity that you enjoy, and set realistic goals. Start slowly, and gradually increase the intensity of your exercise  over time. What do I need to know about managing my diabetes?  Check your blood glucose before and after exercising. If your blood glucose is 240 mg/dL (13.3 mmol/L) or higher before you exercise, check your urine for ketones. If you have ketones in your urine, do not exercise until your blood glucose returns to normal. If your blood glucose is 100 mg/dL (5.6 mmol/L) or lower, eat a snack containing 15-20 grams of carbohydrate. Check your blood glucose 15 minutes after the snack to make sure that your level is above 100 mg/dL (5.6 mmol/L) before you start your exercise. Know the symptoms of low blood glucose (hypoglycemia) and how to treat it. Your risk for hypoglycemia increases during and after exercise. Common symptoms of hypoglycemia can include: Hunger. Anxiety. Sweating and feeling clammy. Confusion. Dizziness or feeling light-headed. Increased heart rate or palpitations. Blurry vision. Tingling or numbness around the mouth, lips, or tongue. Tremors or shakes. Irritability. Keep a rapid-acting carbohydrate snack available before, during, and after exercise to help prevent or treat hypoglycemia. Avoid injecting insulin into areas of the body that are going to be exercised. For example, avoid injecting insulin into: The arms, when playing tennis. The legs, when jogging. Keep records of your exercise habits. Doing this can help you and your health care provider adjust your diabetes management plan as needed. Write down: Food that you eat before and after you exercise. Blood glucose levels before and after you exercise. The type and amount of exercise you have done. When your insulin is expected to peak, if you use insulin. Avoid exercising at times when your insulin is peaking. When you start a new exercise or activity, work with your health care provider to  make sure the activity is safe for you, and to adjust your insulin, medicines, or food intake as needed. Drink plenty of water  while you exercise to prevent dehydration or heat stroke. Drink enough fluid to keep your urine clear or pale yellow. Summary Exercising regularly is important for your overall health, especially when you have diabetes (diabetes mellitus). Exercising has many health benefits, such as increasing muscle strength and bone density and reducing body fat and stress. Your health care provider or certified diabetes educator can help you make a plan for the type and frequency of exercise (activity plan) that works for you. When you start a new exercise or activity, work with your health care provider to make sure the activity is safe for you, and to adjust your insulin, medicines, or food intake as needed. This information is not intended to replace advice given to you by your health care provider. Make sure you discuss any questions you have with your health care provider. Document Revised: 08/19/2016 Document Reviewed: 07/07/2015 Elsevier Patient Education  2020 [place] for Diabetes Mellitus, Adult  Carbohydrate counting is a method of keeping track of how many carbohydrates you eat. Eating carbohydrates naturally increases the amount of sugar (glucose) in the blood. Counting how many carbohydrates you eat helps keep your blood glucose within normal limits, which helps you manage your diabetes (diabetes mellitus). It is important to know how many carbohydrates you can safely have in each meal. This is different for every person. A diet and nutrition specialist (registered dietitian) can help you make a meal plan and calculate how many carbohydrates you should have at each meal and snack. Carbohydrates are found in the following foods:  Grains, such as breads and cereals.  Dried beans and soy products.  Starchy vegetables, such as potatoes, peas, and corn.  Fruit and fruit juices.  Milk and yogurt.  Sweets and snack foods, such as cake, cookies, candy, chips, and  soft drinks. How do I count carbohydrates? There are two ways to count carbohydrates in food. You can use either of the methods or a combination of both. Reading "Nutrition Facts" on packaged food The "Nutrition Facts" list is included on the labels of almost all packaged foods and beverages in the [place] It includes:  The serving size.  Information about nutrients in each serving, including the grams (g) of carbohydrate per serving. To use the "Nutrition Facts":  Decide how many servings you will have.  Multiply the number of servings by the number of carbohydrates per serving.  The resulting number is the total amount of carbohydrates that you will be having. Learning standard serving sizes of other foods When you eat carbohydrate foods that are not packaged or do not include "Nutrition Facts" on the label, you need to measure the servings in order to count the amount of carbohydrates:  Measure the foods that you will eat with a food scale or measuring cup, if needed.  Decide how many standard-size servings you will eat.  Multiply the number of servings by 15. Most carbohydrate-rich foods have about 15 g of carbohydrates per serving. ? For example, if you eat 8 oz (170 g) of strawberries, you will have eaten 2 servings and 30 g of carbohydrates (2 servings x 15 g = 30 g).  For foods that have more than one food mixed, such as soups and casseroles, you must count the carbohydrates in each food that is included. The following list contains standard  serving sizes of common carbohydrate-rich foods. Each of these servings has about 15 g of carbohydrates:   hamburger bun or  English muffin.   oz (15 mL) syrup.   oz (14 g) jelly.  1 slice of bread.  1 six-inch tortilla.  3 oz (85 g) cooked rice or pasta.  4 oz (113 g) cooked dried beans.  4 oz (113 g) starchy vegetable, such as peas, corn, or potatoes.  4 oz (113 g) hot cereal.  4 oz (113 g) mashed potatoes or  of a large  baked potato.  4 oz (113 g) canned or frozen fruit.  4 oz (120 mL) fruit juice.  4-6 crackers.  6 chicken nuggets.  6 oz (170 g) unsweetened dry cereal.  6 oz (170 g) plain fat-free yogurt or yogurt sweetened with artificial sweeteners.  8 oz (240 mL) milk.  8 oz (170 g) fresh fruit or one small piece of fruit.  24 oz (680 g) popped popcorn. Example of carbohydrate counting Sample meal  3 oz (85 g) chicken breast.  6 oz (170 g) brown rice.  4 oz (113 g) corn.  8 oz (240 mL) milk.  8 oz (170 g) strawberries with sugar-free whipped topping. Carbohydrate calculation 1. Identify the foods that contain carbohydrates: ? Rice. ? Corn. ? Milk. ? Strawberries. 2. Calculate how many servings you have of each food: ? 2 servings rice. ? 1 serving corn. ? 1 serving milk. ? 1 serving strawberries. 3. Multiply each number of servings by 15 g: ? 2 servings rice x 15 g = 30 g. ? 1 serving corn x 15 g = 15 g. ? 1 serving milk x 15 g = 15 g. ? 1 serving strawberries x 15 g = 15 g. 4. Add together all of the amounts to find the total grams of carbohydrates eaten: ? 30 g + 15 g + 15 g + 15 g = 75 g of carbohydrates total. Summary  Carbohydrate counting is a method of keeping track of how many carbohydrates you eat.  Eating carbohydrates naturally increases the amount of sugar (glucose) in the blood.  Counting how many carbohydrates you eat helps keep your blood glucose within normal limits, which helps you manage your diabetes.  A diet and nutrition specialist (registered dietitian) can help you make a meal plan and calculate how many carbohydrates you should have at each meal and snack. This information is not intended to replace advice given to you by your health care provider. Make sure you discuss any questions you have with your health care provider. Document Revised: 08/19/2016 Document Reviewed: 07/09/2015 Elsevier Patient Education  [place].

## 2019-06-28 ENCOUNTER — Encounter: Payer: Self-pay | Admitting: Dermatology

## 2019-06-28 ENCOUNTER — Ambulatory Visit (INDEPENDENT_AMBULATORY_CARE_PROVIDER_SITE_OTHER): Payer: Medicare Other | Admitting: Dermatology

## 2019-06-28 DIAGNOSIS — D099 Carcinoma in situ, unspecified: Secondary | ICD-10-CM

## 2019-06-28 DIAGNOSIS — D044 Carcinoma in situ of skin of scalp and neck: Secondary | ICD-10-CM | POA: Diagnosis not present

## 2019-06-28 LAB — CBC WITH DIFFERENTIAL/PLATELET
Basophils Absolute: 0.1 10*3/uL (ref 0.0–0.2)
Basos: 1 %
EOS (ABSOLUTE): 0.3 10*3/uL (ref 0.0–0.4)
Eos: 4 %
Hematocrit: 52.5 % — ABNORMAL HIGH (ref 37.5–51.0)
Hemoglobin: 17.4 g/dL (ref 13.0–17.7)
Immature Grans (Abs): 0.1 10*3/uL (ref 0.0–0.1)
Immature Granulocytes: 1 %
Lymphocytes Absolute: 2 10*3/uL (ref 0.7–3.1)
Lymphs: 25 %
MCH: 29 pg (ref 26.6–33.0)
MCHC: 33.1 g/dL (ref 31.5–35.7)
MCV: 88 fL (ref 79–97)
Monocytes Absolute: 0.5 10*3/uL (ref 0.1–0.9)
Monocytes: 6 %
Neutrophils Absolute: 5.2 10*3/uL (ref 1.4–7.0)
Neutrophils: 63 %
Platelets: 133 10*3/uL — ABNORMAL LOW (ref 150–450)
RBC: 6 x10E6/uL — ABNORMAL HIGH (ref 4.14–5.80)
RDW: 13.5 % (ref 11.6–15.4)
WBC: 8.1 10*3/uL (ref 3.4–10.8)

## 2019-06-28 LAB — CMP14+EGFR
ALT: 22 IU/L (ref 0–44)
AST: 28 IU/L (ref 0–40)
Albumin/Globulin Ratio: 2.5 — ABNORMAL HIGH (ref 1.2–2.2)
Albumin: 4.5 g/dL (ref 3.7–4.7)
Alkaline Phosphatase: 63 IU/L (ref 48–121)
BUN/Creatinine Ratio: 17 (ref 10–24)
BUN: 26 mg/dL (ref 8–27)
Bilirubin Total: 0.8 mg/dL (ref 0.0–1.2)
CO2: 24 mmol/L (ref 20–29)
Calcium: 9.1 mg/dL (ref 8.6–10.2)
Chloride: 99 mmol/L (ref 96–106)
Creatinine, Ser: 1.5 mg/dL — ABNORMAL HIGH (ref 0.76–1.27)
GFR calc Af Amer: 53 mL/min/{1.73_m2} — ABNORMAL LOW (ref >59)
GFR calc non Af Amer: 46 mL/min/{1.73_m2} — ABNORMAL LOW (ref >59)
Globulin, Total: 1.8 g/dL (ref 1.5–4.5)
Glucose: 150 mg/dL — ABNORMAL HIGH (ref 65–99)
Potassium: 3.8 mmol/L (ref 3.5–5.2)
Sodium: 137 mmol/L (ref 134–144)
Total Protein: 6.3 g/dL (ref 6.0–8.5)

## 2019-06-28 LAB — LIPID PANEL
Chol/HDL Ratio: 5.7 ratio — ABNORMAL HIGH (ref 0.0–5.0)
Cholesterol, Total: 200 mg/dL — ABNORMAL HIGH (ref 100–199)
HDL: 35 mg/dL — ABNORMAL LOW (ref >39)
LDL Chol Calc (NIH): 132 mg/dL — ABNORMAL HIGH (ref 0–99)
Triglycerides: 186 mg/dL — ABNORMAL HIGH (ref 0–149)
VLDL Cholesterol Cal: 33 mg/dL (ref 5–40)

## 2019-06-28 NOTE — Patient Instructions (Signed)

## 2019-07-03 NOTE — Progress Notes (Signed)
Hello Daniel Myers,  Your lab result is normal and/or stable.Some minor variations that are not significant are commonly marked abnormal, but do not represent any medical problem for you.  Best regards, Allyah Heather, M.D.

## 2019-07-04 ENCOUNTER — Encounter: Payer: Self-pay | Admitting: Dermatology

## 2019-07-04 NOTE — Progress Notes (Signed)
   Follow-Up Visit   Subjective  Daniel Myers is a 74 y.o. male who presents for the following: Procedure (Here this morning for treatment of SCC on right neck.).  CIS Location: Right neck Duration:  Quality:  Associated Signs/Symptoms: Modifying Factors:  Severity:  Timing: Context: For treatment  The following portions of the chart were reviewed this encounter and updated as appropriate: Tobacco  Allergies  Meds  Problems  Med Hx  Surg Hx  Fam Hx      Objective  Well appearing patient in no apparent distress; mood and affect are within normal limits.  A focused examination was performed including Head and neck. Relevant physical exam findings are noted in the Assessment and Plan.   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  Destruction of lesion Complexity: simple   Destruction method: electrodesiccation and curettage   Informed consent: discussed and consent obtained   Timeout:  patient name, date of birth, surgical site, and procedure verified Anesthesia: the lesion was anesthetized in a standard fashion   Anesthetic:  1% lidocaine w/ epinephrine 1-100,000 local infiltration Curettage performed in three different directions: Yes     Electrodesiccation performed over the curetted area: No   Curettage cycles:  3 Lesion length (cm):  2 Lesion width (cm):  1 Margin per side (cm):  0 Final wound size (cm):  2 Hemostasis achieved with:  ferric subsulfate Outcome: patient tolerated procedure well with no complications   Post-procedure details: wound care instructions given   Additional details:  Inoculated with parenteral 5% fluorouracil

## 2019-10-02 ENCOUNTER — Other Ambulatory Visit: Payer: Self-pay

## 2019-10-02 ENCOUNTER — Ambulatory Visit (INDEPENDENT_AMBULATORY_CARE_PROVIDER_SITE_OTHER): Payer: Medicare Other | Admitting: Family Medicine

## 2019-10-02 ENCOUNTER — Encounter: Payer: Self-pay | Admitting: Family Medicine

## 2019-10-02 VITALS — BP 139/77 | HR 66 | Temp 97.5°F | Ht 70.0 in | Wt 208.2 lb

## 2019-10-02 DIAGNOSIS — Z940 Kidney transplant status: Secondary | ICD-10-CM | POA: Diagnosis not present

## 2019-10-02 DIAGNOSIS — I10 Essential (primary) hypertension: Secondary | ICD-10-CM

## 2019-10-02 DIAGNOSIS — E782 Mixed hyperlipidemia: Secondary | ICD-10-CM

## 2019-10-02 DIAGNOSIS — Z794 Long term (current) use of insulin: Secondary | ICD-10-CM | POA: Diagnosis not present

## 2019-10-02 DIAGNOSIS — E119 Type 2 diabetes mellitus without complications: Secondary | ICD-10-CM

## 2019-10-02 LAB — BAYER DCA HB A1C WAIVED: HB A1C (BAYER DCA - WAIVED): 7.5 % — ABNORMAL HIGH (ref <7.0)

## 2019-10-02 NOTE — Progress Notes (Signed)
Subjective:  Patient ID: Daniel Myers, male    DOB: 22-Feb-1945  Age: 74 y.o. MRN: [id number]  CC: Follow-up   HPI TADARRIUS BURCH presents forFollow-up of diabetes. Patient checks blood sugar at home.   100-115 fasting and 250-300 postprandial Patient denies symptoms such as polyuria, polydipsia, excessive hunger, nausea Occasional  overnighthypoglycemic spells noted. Taking Novolin N, 30 units before breakfast and 40 units before supper.  However he tends to take the Novolin and later.  He is using the regular human insulin 10 units before lunch and 1`3 before  supper. Medications reviewed.  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), Squamous cell carcinoma of skin (11/08/2011), Squamous cell carcinoma of skin (11/08/2011), Squamous cell carcinoma of skin (11/08/2011), 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), 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), 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), 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), 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), 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), 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), 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), Squamous cell carcinoma of skin (09/30/2015), Squamous cell carcinoma of skin (02/22/2017), Squamous cell carcinoma of skin (02/22/2017), Squamous cell carcinoma of skin (04/11/2018), Squamous cell carcinoma of skin (04/11/2018), Squamous cell carcinoma of skin (04/11/2018), Squamous cell carcinoma  of skin (04/11/2018), Squamous cell carcinoma of skin (04/11/2018), Squamous cell carcinoma of skin (09/28/2018), Squamous cell carcinoma of skin (09/28/2018), Squamous cell carcinoma of skin (09/28/2018), Squamous cell carcinoma of skin (03/21/2019), Squamous cell carcinoma of skin (03/21/2019), and Squamous cell carcinoma of skin (03/21/2019).   He has a past surgical history that includes Kidney transplant (Right).   His family history includes Clotting disorder in his mother; Diabetes in his sister; Hypertension in his sister.He reports that he has been smoking pipe. He has never used smokeless tobacco. He reports that he does not drink alcohol and does not use drugs.  Current Outpatient Medications on File Prior to Visit  Medication Sig Dispense Refill  . amLODipine (NORVASC) 10 MG tablet Take 1 tablet (10 mg total) by mouth daily. 90 tablet 1  . aspirin EC 81 MG tablet Take by mouth.    . fexofenadine-pseudoephedrine (ALLEGRA-D 24) 180-240 MG 24 hr tablet Take 1 tablet by mouth every evening. For allergy and congestion 30 tablet 11  . fluorouracil (EFUDEX) 5 % cream APPLY A SMALL AMOUNT OF CREAM TOPICALLY TWICE DAILY TO RIGHT EAR FOR 3 WEEKS    . fluticasone (FLONASE) 50 MCG/ACT nasal spray USE ONE SPRAY(S) IN EACH NOSTRIL ONCE DAILY 16 g 11  . furosemide (LASIX) 40 MG tablet Take 40 mg by mouth daily.    . insulin NPH Human (NOVOLIN N) 100 UNIT/ML injection 30 units sq AC breakfast, 40 units sq ac supper 30 mL 11  . loratadine (CLARITIN) 10 MG tablet Take by mouth.    . mycophenolate (CELLCEPT) 250 MG capsule TAKE 2 CAPSULES BY MOUTH TWO TIMES DAILY.    Marland Kitchen omega-3 acid ethyl esters (LOVAZA) 1 g capsule Take 2 capsules (2 g total) by mouth 2 (two) times daily. 360 capsule 3  . pravastatin (PRAVACHOL) 80 MG tablet TAKE 1 TABLET BY MOUTH ONCE DAILY IN THE MORNING 90 tablet  3  . predniSONE (DELTASONE) 5 MG tablet Take 5 mg by mouth daily.    Marland Kitchen RELION INSULIN SYRINGE 31G X 15/64" 0.5 ML MISC USE  AS DIRECTED FOR INSULIN    . sodium bicarbonate 650 MG tablet Take 650 mg by mouth 2 (two) times daily.    . tacrolimus (PROGRAF) 0.5 MG capsule TAKE 2 CAPSULES BY MOUTH TWO TIMES DAILY.     No current facility-administered medications on file prior to visit.    ROS Review of Systems  Constitutional: Negative.   HENT: Negative.   Eyes: Negative for visual disturbance.  Respiratory: Negative for cough and shortness of breath.   Cardiovascular: Negative for chest pain and leg swelling.  Gastrointestinal: Negative for abdominal pain, diarrhea, nausea and vomiting.  Genitourinary: Negative for difficulty urinating.  Musculoskeletal: Negative for arthralgias and myalgias.  Skin: Negative for rash.  Neurological: Negative for headaches.  Psychiatric/Behavioral: Negative for sleep disturbance.    Objective:  BP 139/77   Pulse 66   Temp (!) 97.5 F (36.4 C) (Temporal)   Ht '5\' 10"'  (1.778 m)   Wt 208 lb 3.2 oz (94.4 kg)   BMI 29.87 kg/m   BP Readings from Last 3 Encounters:  10/02/19 139/77  06/27/19 135/75  03/14/19 (!) 146/52    Wt Readings from Last 3 Encounters:  10/02/19 208 lb 3.2 oz (94.4 kg)  06/27/19 210 lb 6 oz (95.4 kg)  03/14/19 202 lb (91.6 kg)     Physical Exam Vitals reviewed.  Constitutional:      Appearance: He is well-developed.  HENT:     Head: Normocephalic and atraumatic.     Right Ear: Tympanic membrane and external ear normal. No decreased hearing noted.     Left Ear: Tympanic membrane and external ear normal. No decreased hearing noted.     Mouth/Throat:     Pharynx: No oropharyngeal exudate or posterior oropharyngeal erythema.  Eyes:     Pupils: Pupils are equal, round, and reactive to light.  Cardiovascular:     Rate and Rhythm: Normal rate and regular rhythm.     Heart sounds: No murmur heard.   Pulmonary:     Effort: No respiratory distress.     Breath sounds: Normal breath sounds.  Abdominal:     General: Bowel sounds are normal.      Palpations: Abdomen is soft. There is no mass.     Tenderness: There is no abdominal tenderness.  Musculoskeletal:     Cervical back: Normal range of motion and neck supple.     Results for orders placed or performed in visit on 10/02/19  CBC with Differential/Platelet  Result Value Ref Range   WBC 7.9 3.4 - 10.8 x10E3/uL   RBC 6.04 (H) 4.14 - 5.80 x10E6/uL   Hemoglobin 17.3 13.0 - 17.7 g/dL   Hematocrit 52.6 (H) 37.5 - 51.0 %   MCV 87 79 - 97 fL   MCH 28.6 26.6 - 33.0 pg   MCHC 32.9 31 - 35 g/dL   RDW 13.0 11.6 - 15.4 %   Platelets 147 (L) 150 - 450 x10E3/uL   Neutrophils 67 Not Estab. %   Lymphs 22 Not Estab. %   Monocytes 6 Not Estab. %   Eos 3 Not Estab. %   Basos 1 Not Estab. %   Neutrophils Absolute 5.4 1 - 7 x10E3/uL   Lymphocytes Absolute 1.7 0 - 3 x10E3/uL   Monocytes Absolute 0.5 0 - 0 x10E3/uL   EOS (ABSOLUTE) 0.2  0.0 - 0.4 x10E3/uL   Basophils Absolute 0.1 0 - 0 x10E3/uL   Immature Granulocytes 1 Not Estab. %   Immature Grans (Abs) 0.1 0.0 - 0.1 x10E3/uL  CMP14+EGFR  Result Value Ref Range   Glucose 116 (H) 65 - 99 mg/dL   BUN 23 8 - 27 mg/dL   Creatinine, Ser 1.44 (H) 0.76 - 1.27 mg/dL   GFR calc non Af Amer 48 (L) >59 mL/min/1.73   GFR calc Af Amer 55 (L) >59 mL/min/1.73   BUN/Creatinine Ratio 16 10 - 24   Sodium 136 134 - 144 mmol/L   Potassium 3.8 3.5 - 5.2 mmol/L   Chloride 99 96 - 106 mmol/L   CO2 23 20 - 29 mmol/L   Calcium 9.3 8.6 - 10.2 mg/dL   Total Protein 6.4 6.0 - 8.5 g/dL   Albumin 4.4 3.7 - 4.7 g/dL   Globulin, Total 2.0 1.5 - 4.5 g/dL   Albumin/Globulin Ratio 2.2 1.2 - 2.2   Bilirubin Total 0.7 0.0 - 1.2 mg/dL   Alkaline Phosphatase 59 48 - 121 IU/L   AST 24 0 - 40 IU/L   ALT 20 0 - 44 IU/L  Lipid panel  Result Value Ref Range   Cholesterol, Total 206 (H) 100 - 199 mg/dL   Triglycerides 193 (H) 0 - 149 mg/dL   HDL 33 (L) >39 mg/dL   VLDL Cholesterol Cal 35 5 - 40 mg/dL   LDL Chol Calc (NIH) 138 (H) 0 - 99 mg/dL   Chol/HDL Ratio  6.2 (H) 0.0 - 5.0 ratio  Bayer DCA Hb A1c Waived  Result Value Ref Range   HB A1C (BAYER DCA - WAIVED) 7.5 (H) <7.0 %     Assessment & Plan:   Awab was seen today for follow-up.  Diagnoses and all orders for this visit:  Insulin dependent type 2 diabetes mellitus ([place]) -     CBC with Differential/Platelet -     CMP14+EGFR -     Bayer DCA Hb A1c Waived -     Discontinue: Insulin Regular Human 100 UNIT/ML SOPN; Inject 10 units before lunch and 13 units before supper. -     Insulin Regular Human 100 UNIT/ML SOPN; Inject 10 units before lunch and 5 units before supper.  Mixed hyperlipidemia -     CBC with Differential/Platelet -     CMP14+EGFR -     Lipid panel  Essential hypertension -     CBC with Differential/Platelet -     CMP14+EGFR  Renal transplant recipient -     CBC with Differential/Platelet -     CMP14+EGFR      I have discontinued Truddie Crumble L. Hafen's sulfamethoxazole-trimethoprim and Insulin Regular Human. I have also changed his Insulin Regular Human. Additionally, I am having him maintain his tacrolimus, sodium bicarbonate, predniSONE, mycophenolate, furosemide, aspirin EC, ReliOn Insulin Syringe, loratadine, amLODipine, fexofenadine-pseudoephedrine, fluticasone, insulin NPH Human, omega-3 acid ethyl esters, pravastatin, and fluorouracil.  Meds ordered this encounter  Medications  . DISCONTD: Insulin Regular Human 100 UNIT/ML SOPN    Sig: Inject 10 units before lunch and 13 units before supper.    Dispense:  10 mL    Refill:  prn  . Insulin Regular Human 100 UNIT/ML SOPN    Sig: Inject 10 units before lunch and 5 units before supper.    Dispense:  10 mL    Refill:  prn   Due to the overnight lows I asked him to reduce his suppertime Novolin Arash to  5 units.  He also needs to be sure and use the Novolin and about half an hour to 10 minutes before supper.  Patient declined to use a GLP-1 agonist at this time.  He wants to try again with his diet for the next  3 months.  Follow-up: Return in about 3 months (around 01/02/2020).  Claretta Fraise, M.D.

## 2019-10-03 ENCOUNTER — Other Ambulatory Visit: Payer: Self-pay | Admitting: Family Medicine

## 2019-10-03 LAB — CMP14+EGFR
ALT: 20 IU/L (ref 0–44)
AST: 24 IU/L (ref 0–40)
Albumin/Globulin Ratio: 2.2 (ref 1.2–2.2)
Albumin: 4.4 g/dL (ref 3.7–4.7)
Alkaline Phosphatase: 59 IU/L (ref 48–121)
BUN/Creatinine Ratio: 16 (ref 10–24)
BUN: 23 mg/dL (ref 8–27)
Bilirubin Total: 0.7 mg/dL (ref 0.0–1.2)
CO2: 23 mmol/L (ref 20–29)
Calcium: 9.3 mg/dL (ref 8.6–10.2)
Chloride: 99 mmol/L (ref 96–106)
Creatinine, Ser: 1.44 mg/dL — ABNORMAL HIGH (ref 0.76–1.27)
GFR calc Af Amer: 55 mL/min/{1.73_m2} — ABNORMAL LOW (ref >59)
GFR calc non Af Amer: 48 mL/min/{1.73_m2} — ABNORMAL LOW (ref >59)
Globulin, Total: 2 g/dL (ref 1.5–4.5)
Glucose: 116 mg/dL — ABNORMAL HIGH (ref 65–99)
Potassium: 3.8 mmol/L (ref 3.5–5.2)
Sodium: 136 mmol/L (ref 134–144)
Total Protein: 6.4 g/dL (ref 6.0–8.5)

## 2019-10-03 LAB — LIPID PANEL
Chol/HDL Ratio: 6.2 ratio — ABNORMAL HIGH (ref 0.0–5.0)
Cholesterol, Total: 206 mg/dL — ABNORMAL HIGH (ref 100–199)
HDL: 33 mg/dL — ABNORMAL LOW (ref >39)
LDL Chol Calc (NIH): 138 mg/dL — ABNORMAL HIGH (ref 0–99)
Triglycerides: 193 mg/dL — ABNORMAL HIGH (ref 0–149)
VLDL Cholesterol Cal: 35 mg/dL (ref 5–40)

## 2019-10-03 LAB — CBC WITH DIFFERENTIAL/PLATELET
Basophils Absolute: 0.1 10*3/uL (ref 0.0–0.2)
Basos: 1 %
EOS (ABSOLUTE): 0.2 10*3/uL (ref 0.0–0.4)
Eos: 3 %
Hematocrit: 52.6 % — ABNORMAL HIGH (ref 37.5–51.0)
Hemoglobin: 17.3 g/dL (ref 13.0–17.7)
Immature Grans (Abs): 0.1 10*3/uL (ref 0.0–0.1)
Immature Granulocytes: 1 %
Lymphocytes Absolute: 1.7 10*3/uL (ref 0.7–3.1)
Lymphs: 22 %
MCH: 28.6 pg (ref 26.6–33.0)
MCHC: 32.9 g/dL (ref 31.5–35.7)
MCV: 87 fL (ref 79–97)
Monocytes Absolute: 0.5 10*3/uL (ref 0.1–0.9)
Monocytes: 6 %
Neutrophils Absolute: 5.4 10*3/uL (ref 1.4–7.0)
Neutrophils: 67 %
Platelets: 147 10*3/uL — ABNORMAL LOW (ref 150–450)
RBC: 6.04 x10E6/uL — ABNORMAL HIGH (ref 4.14–5.80)
RDW: 13 % (ref 11.6–15.4)
WBC: 7.9 10*3/uL (ref 3.4–10.8)

## 2019-10-03 MED ORDER — ROSUVASTATIN CALCIUM 10 MG PO TABS: 10.0000 mg | ORAL_TABLET | Freq: Every day | ORAL | 1 refills | Status: DC

## 2019-10-07 ENCOUNTER — Encounter: Payer: Self-pay | Admitting: Family Medicine

## 2019-10-07 MED ORDER — INSULIN REGULAR HUMAN 100 UNIT/ML IJ SOPN: PEN_INJECTOR | INTRAMUSCULAR | 99 refills | Status: DC

## 2019-10-08 ENCOUNTER — Encounter: Payer: Self-pay | Admitting: Family Medicine

## 2019-10-08 MED ORDER — BLOOD GLUCOSE METER KIT: PACK | 0 refills | Status: DC

## 2019-10-08 NOTE — Telephone Encounter (Signed)
PRINTED FOR STACKS TO SIGN ON HIS DESK

## 2019-10-30 ENCOUNTER — Other Ambulatory Visit: Payer: Self-pay | Admitting: Family Medicine

## 2019-10-30 DIAGNOSIS — I10 Essential (primary) hypertension: Secondary | ICD-10-CM

## 2020-01-02 ENCOUNTER — Ambulatory Visit (INDEPENDENT_AMBULATORY_CARE_PROVIDER_SITE_OTHER): Payer: Medicare Other | Admitting: Family Medicine

## 2020-01-02 ENCOUNTER — Other Ambulatory Visit: Payer: Self-pay

## 2020-01-02 ENCOUNTER — Encounter: Payer: Self-pay | Admitting: Family Medicine

## 2020-01-02 VITALS — BP 166/68 | HR 60 | Temp 97.5°F | Resp 20 | Ht 70.0 in | Wt 210.4 lb

## 2020-01-02 DIAGNOSIS — Z794 Long term (current) use of insulin: Secondary | ICD-10-CM | POA: Diagnosis not present

## 2020-01-02 DIAGNOSIS — G720 Drug-induced myopathy: Secondary | ICD-10-CM

## 2020-01-02 DIAGNOSIS — Z940 Kidney transplant status: Secondary | ICD-10-CM

## 2020-01-02 DIAGNOSIS — E119 Type 2 diabetes mellitus without complications: Secondary | ICD-10-CM | POA: Diagnosis not present

## 2020-01-02 DIAGNOSIS — I10 Essential (primary) hypertension: Secondary | ICD-10-CM

## 2020-01-02 DIAGNOSIS — E782 Mixed hyperlipidemia: Secondary | ICD-10-CM

## 2020-01-02 DIAGNOSIS — E871 Hypo-osmolality and hyponatremia: Secondary | ICD-10-CM

## 2020-01-02 DIAGNOSIS — T466X5A Adverse effect of antihyperlipidemic and antiarteriosclerotic drugs, initial encounter: Secondary | ICD-10-CM

## 2020-01-02 LAB — CMP14+EGFR
ALT: 17 IU/L (ref 0–44)
AST: 20 IU/L (ref 0–40)
Albumin/Globulin Ratio: 2.4 — ABNORMAL HIGH (ref 1.2–2.2)
Albumin: 4.5 g/dL (ref 3.7–4.7)
Alkaline Phosphatase: 70 IU/L (ref 44–121)
BUN/Creatinine Ratio: 15 (ref 10–24)
BUN: 21 mg/dL (ref 8–27)
Bilirubin Total: 0.7 mg/dL (ref 0.0–1.2)
CO2: 23 mmol/L (ref 20–29)
Calcium: 9.3 mg/dL (ref 8.6–10.2)
Chloride: 97 mmol/L (ref 96–106)
Creatinine, Ser: 1.42 mg/dL — ABNORMAL HIGH (ref 0.76–1.27)
GFR calc Af Amer: 56 mL/min/{1.73_m2} — ABNORMAL LOW (ref >59)
GFR calc non Af Amer: 49 mL/min/{1.73_m2} — ABNORMAL LOW (ref >59)
Globulin, Total: 1.9 g/dL (ref 1.5–4.5)
Glucose: 216 mg/dL — ABNORMAL HIGH (ref 65–99)
Potassium: 4 mmol/L (ref 3.5–5.2)
Sodium: 136 mmol/L (ref 134–144)
Total Protein: 6.4 g/dL (ref 6.0–8.5)

## 2020-01-02 LAB — LIPID PANEL
Chol/HDL Ratio: 5.7 ratio — ABNORMAL HIGH (ref 0.0–5.0)
Cholesterol, Total: 194 mg/dL (ref 100–199)
HDL: 34 mg/dL — ABNORMAL LOW (ref >39)
LDL Chol Calc (NIH): 125 mg/dL — ABNORMAL HIGH (ref 0–99)
Triglycerides: 197 mg/dL — ABNORMAL HIGH (ref 0–149)
VLDL Cholesterol Cal: 35 mg/dL (ref 5–40)

## 2020-01-02 LAB — CBC WITH DIFFERENTIAL/PLATELET
Basophils Absolute: 0.1 10*3/uL (ref 0.0–0.2)
Basos: 1 %
EOS (ABSOLUTE): 0.3 10*3/uL (ref 0.0–0.4)
Eos: 4 %
Hematocrit: 50.6 % (ref 37.5–51.0)
Hemoglobin: 17.1 g/dL (ref 13.0–17.7)
Immature Grans (Abs): 0.1 10*3/uL (ref 0.0–0.1)
Immature Granulocytes: 1 %
Lymphocytes Absolute: 1.7 10*3/uL (ref 0.7–3.1)
Lymphs: 22 %
MCH: 29.3 pg (ref 26.6–33.0)
MCHC: 33.8 g/dL (ref 31.5–35.7)
MCV: 87 fL (ref 79–97)
Monocytes Absolute: 0.4 10*3/uL (ref 0.1–0.9)
Monocytes: 5 %
Neutrophils Absolute: 5.2 10*3/uL (ref 1.4–7.0)
Neutrophils: 67 %
Platelets: 139 10*3/uL — ABNORMAL LOW (ref 150–450)
RBC: 5.84 x10E6/uL — ABNORMAL HIGH (ref 4.14–5.80)
RDW: 12.4 % (ref 11.6–15.4)
WBC: 7.8 10*3/uL (ref 3.4–10.8)

## 2020-01-02 LAB — BAYER DCA HB A1C WAIVED: HB A1C (BAYER DCA - WAIVED): 7.4 % — ABNORMAL HIGH (ref <7.0)

## 2020-01-02 NOTE — Progress Notes (Addendum)
Subjective:  Patient ID: Daniel Myers,  male    DOB: 29-Oct-1945  Age: 74 y.o.    CC: Medical Management of Chronic Issues   HPI VENICE LIZ presents for  follow-up of hypertension. Patient has no history of headache chest pain or shortness of breath or recent cough. Patient also denies symptoms of TIA such as numbness weakness lateralizing. Patient denies side effects from medication. States taking it regularly.  Patient also  in for follow-up of elevated cholesterol. He has tried multiplle statins and had mopathy that is intolerable on each trial.  Follow-up of diabetes. Patient does check blood sugar at home. Readings run between 90-120 fasting  and 190-250 postprandial. Patient denies symptoms such as excessive hunger or urinary frequency, excessive hunger, nausea No significant hypoglycemic spells noted. Medications reviewed. Pt reports taking them regularly.  Pt. denies complication/adverse reaction today.    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), Squamous cell carcinoma of skin (11/08/2011), Squamous cell carcinoma of skin (11/08/2011), Squamous cell carcinoma of skin (11/08/2011), 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), 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), 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), 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), 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), 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), 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),  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), Squamous cell carcinoma of skin (09/30/2015), Squamous cell carcinoma of skin (02/22/2017), Squamous cell carcinoma of skin (02/22/2017), Squamous cell carcinoma of skin (04/11/2018), Squamous cell carcinoma of skin (04/11/2018), Squamous cell carcinoma of skin (04/11/2018), Squamous cell carcinoma of skin (04/11/2018), Squamous cell carcinoma of skin (04/11/2018), Squamous cell carcinoma of skin (09/28/2018), Squamous cell carcinoma of skin (09/28/2018), Squamous cell carcinoma of skin (09/28/2018), Squamous cell carcinoma of skin (03/21/2019), Squamous cell carcinoma of skin (03/21/2019), and Squamous cell carcinoma of skin (03/21/2019).   He has a past surgical history that includes Kidney transplant (Right).   His family history includes Clotting disorder in his mother; Diabetes in his sister; Hypertension in his sister.He reports that he has been smoking pipe. He has never used smokeless tobacco. He reports that he does not drink alcohol and does not use drugs.  Current Outpatient Medications on File Prior to Visit  Medication Sig Dispense Refill  . amLODipine (NORVASC) 10 MG tablet Take 1 tablet by mouth once daily 90 tablet 1  . aspirin EC 81 MG tablet Take by mouth.    . blood glucose meter kit and supplies Dispense based on patient and insurance preference. Use up to four times daily as directed. (FOR ICD-10 E10.9, E11.9). 1 each 0  . fexofenadine-pseudoephedrine (ALLEGRA-D 24) 180-240 MG 24 hr tablet Take 1 tablet by mouth every evening. For allergy and congestion 30 tablet 11  . fluorouracil (EFUDEX) 5 % cream APPLY A SMALL AMOUNT OF CREAM TOPICALLY TWICE DAILY TO RIGHT EAR FOR 3 WEEKS    . fluticasone (FLONASE) 50 MCG/ACT nasal spray USE ONE SPRAY(S) IN EACH NOSTRIL ONCE DAILY 16 g 11  . furosemide (LASIX) 40 MG tablet Take 40 mg by mouth daily.    . insulin NPH Human (NOVOLIN N) 100 UNIT/ML injection 30 units sq AC breakfast, 40 units  sq ac  supper 30 mL 11  . loratadine (CLARITIN) 10 MG tablet Take by mouth.    . mycophenolate (CELLCEPT) 250 MG capsule TAKE 2 CAPSULES BY MOUTH TWO TIMES DAILY.    Marland Kitchen omega-3 acid ethyl esters (LOVAZA) 1 g capsule Take 2 capsules (2 g total) by mouth 2 (two) times daily. 360 capsule 3  . pravastatin (PRAVACHOL) 80 MG tablet TAKE 1 TABLET BY MOUTH ONCE DAILY IN THE MORNING 90 tablet 3  . predniSONE (DELTASONE) 5 MG tablet Take 5 mg by mouth daily.    Marland Kitchen RELION INSULIN SYRINGE 31G X 15/64" 0.5 ML MISC USE AS DIRECTED FOR INSULIN    . sodium bicarbonate 650 MG tablet Take 650 mg by mouth 2 (two) times daily.    . tacrolimus (PROGRAF) 0.5 MG capsule TAKE 2 CAPSULES BY MOUTH TWO TIMES DAILY.    . rosuvastatin (CRESTOR) 10 MG tablet Take 1 tablet (10 mg total) by mouth daily. For cholesterol (Patient not taking: Reported on 01/02/2020) 90 tablet 1   No current facility-administered medications on file prior to visit.    ROS Review of Systems  Constitutional: Negative for fever.  Respiratory: Negative for shortness of breath.   Cardiovascular: Negative for chest pain.  Musculoskeletal: Negative for arthralgias.  Skin: Negative for rash.    Objective:  BP (!) 166/68   Pulse 60   Temp (!) 97.5 F (36.4 C) (Temporal)   Resp 20   Ht '5\' 10"'  (1.778 m)   Wt 210 lb 6 oz (95.4 kg)   SpO2 96%   BMI 30.19 kg/m   BP Readings from Last 3 Encounters:  01/02/20 (!) 166/68  10/02/19 139/77  06/27/19 135/75    Wt Readings from Last 3 Encounters:  01/02/20 210 lb 6 oz (95.4 kg)  10/02/19 208 lb 3.2 oz (94.4 kg)  06/27/19 210 lb 6 oz (95.4 kg)     Physical Exam Vitals reviewed.  Constitutional:      Appearance: He is well-developed.  HENT:     Head: Normocephalic and atraumatic.     Right Ear: Tympanic membrane and external ear normal. No decreased hearing noted.     Left Ear: Tympanic membrane and external ear normal. No decreased hearing noted.     Mouth/Throat:     Pharynx: No  oropharyngeal exudate or posterior oropharyngeal erythema.  Eyes:     Pupils: Pupils are equal, round, and reactive to light.  Cardiovascular:     Rate and Rhythm: Normal rate and regular rhythm.     Heart sounds: No murmur heard.   Pulmonary:     Effort: No respiratory distress.     Breath sounds: Normal breath sounds.  Abdominal:     General: Bowel sounds are normal.     Palpations: Abdomen is soft. There is no mass.     Tenderness: There is no abdominal tenderness.  Musculoskeletal:     Cervical back: Normal range of motion and neck supple.         Assessment & Plan:   Ark was seen today for medical management of chronic issues.  Diagnoses and all orders for this visit:  Insulin dependent type 2 diabetes mellitus ([place]) -     Bayer DCA Hb A1c Waived -     CBC with Differential/Platelet -     CMP14+EGFR -     Lipid panel -     Insulin Regular Human 100 UNIT/ML SOPN; Inject 12 units before lunch and 7 units before supper.  Mixed hyperlipidemia -     CBC with Differential/Platelet -     CMP14+EGFR -     Lipid panel  Essential hypertension -     CBC with Differential/Platelet -     CMP14+EGFR -     Lipid panel  Renal transplant recipient  Hyponatremia  Statin myopathy  Patient is type II diabetic based on history.  Therefore, should the increase in the mealtime insulin not to have an impact for him, I would like for him to consider Ozempic or Trulicity or Victoza on future I have changed Estefan L. Sunderlin's Insulin Regular Human. I am also having him maintain his tacrolimus, sodium bicarbonate, predniSONE, mycophenolate, furosemide, aspirin EC, ReliOn Insulin Syringe, loratadine, fexofenadine-pseudoephedrine, fluticasone, insulin NPH Human, omega-3 acid ethyl esters, pravastatin, fluorouracil, rosuvastatin, blood glucose meter kit and supplies, and amLODipine.  Meds ordered this encounter  Medications  . Insulin Regular Human 100 UNIT/ML SOPN    Sig: Inject 12  units before lunch and 7 units before supper.    Dispense:  10 mL    Refill:  prn     Follow-up: Return in about 3 months (around 04/03/2020).  Claretta Fraise, M.D.

## 2020-01-06 ENCOUNTER — Encounter: Payer: Self-pay | Admitting: Family Medicine

## 2020-01-06 MED ORDER — INSULIN REGULAR HUMAN 100 UNIT/ML IJ SOPN: PEN_INJECTOR | INTRAMUSCULAR | 99 refills | Status: DC

## 2020-01-14 ENCOUNTER — Ambulatory Visit: Payer: Medicare Other | Admitting: Dermatology

## 2020-01-15 ENCOUNTER — Ambulatory Visit: Payer: Medicare Other | Admitting: Pharmacist

## 2020-01-22 DIAGNOSIS — R809 Proteinuria, unspecified: Secondary | ICD-10-CM | POA: Insufficient documentation

## 2020-01-22 DIAGNOSIS — D849 Immunodeficiency, unspecified: Secondary | ICD-10-CM | POA: Insufficient documentation

## 2020-02-11 ENCOUNTER — Emergency Department (HOSPITAL_COMMUNITY): Payer: Medicare Other

## 2020-02-11 ENCOUNTER — Inpatient Hospital Stay (HOSPITAL_COMMUNITY)
Admission: EM | Admit: 2020-02-11 | Discharge: 2020-02-18 | DRG: 177 | Disposition: A | Discharge status: Home / Self Care | Payer: Medicare Other | Attending: Family Medicine | Admitting: Family Medicine

## 2020-02-11 ENCOUNTER — Other Ambulatory Visit: Payer: Self-pay

## 2020-02-11 ENCOUNTER — Encounter (HOSPITAL_COMMUNITY): Payer: Self-pay | Admitting: *Deleted

## 2020-02-11 DIAGNOSIS — Z940 Kidney transplant status: Secondary | ICD-10-CM

## 2020-02-11 DIAGNOSIS — T501X5A Adverse effect of loop [high-ceiling] diuretics, initial encounter: Secondary | ICD-10-CM | POA: Diagnosis present

## 2020-02-11 DIAGNOSIS — R0902 Hypoxemia: Secondary | ICD-10-CM

## 2020-02-11 DIAGNOSIS — I130 Hypertensive heart and chronic kidney disease with heart failure and stage 1 through stage 4 chronic kidney disease, or unspecified chronic kidney disease: Secondary | ICD-10-CM | POA: Diagnosis present

## 2020-02-11 DIAGNOSIS — R0602 Shortness of breath: Secondary | ICD-10-CM

## 2020-02-11 DIAGNOSIS — T8619 Other complication of kidney transplant: Secondary | ICD-10-CM | POA: Diagnosis present

## 2020-02-11 DIAGNOSIS — I5043 Acute on chronic combined systolic (congestive) and diastolic (congestive) heart failure: Secondary | ICD-10-CM | POA: Diagnosis present

## 2020-02-11 DIAGNOSIS — Z8249 Family history of ischemic heart disease and other diseases of the circulatory system: Secondary | ICD-10-CM

## 2020-02-11 DIAGNOSIS — D631 Anemia in chronic kidney disease: Secondary | ICD-10-CM | POA: Diagnosis present

## 2020-02-11 DIAGNOSIS — Y92239 Unspecified place in hospital as the place of occurrence of the external cause: Secondary | ICD-10-CM | POA: Diagnosis present

## 2020-02-11 DIAGNOSIS — E871 Hypo-osmolality and hyponatremia: Secondary | ICD-10-CM | POA: Diagnosis present

## 2020-02-11 DIAGNOSIS — J9601 Acute respiratory failure with hypoxia: Secondary | ICD-10-CM

## 2020-02-11 DIAGNOSIS — N179 Acute kidney failure, unspecified: Secondary | ICD-10-CM | POA: Diagnosis present

## 2020-02-11 DIAGNOSIS — B3322 Viral myocarditis: Secondary | ICD-10-CM

## 2020-02-11 DIAGNOSIS — R0789 Other chest pain: Secondary | ICD-10-CM

## 2020-02-11 DIAGNOSIS — I214 Non-ST elevation (NSTEMI) myocardial infarction: Secondary | ICD-10-CM | POA: Diagnosis present

## 2020-02-11 DIAGNOSIS — R778 Other specified abnormalities of plasma proteins: Secondary | ICD-10-CM

## 2020-02-11 DIAGNOSIS — U071 COVID-19: Principal | ICD-10-CM

## 2020-02-11 DIAGNOSIS — Z833 Family history of diabetes mellitus: Secondary | ICD-10-CM

## 2020-02-11 DIAGNOSIS — R079 Chest pain, unspecified: Secondary | ICD-10-CM

## 2020-02-11 DIAGNOSIS — Z79899 Other long term (current) drug therapy: Secondary | ICD-10-CM

## 2020-02-11 DIAGNOSIS — F17290 Nicotine dependence, other tobacco product, uncomplicated: Secondary | ICD-10-CM | POA: Diagnosis present

## 2020-02-11 DIAGNOSIS — Z832 Family history of diseases of the blood and blood-forming organs and certain disorders involving the immune mechanism: Secondary | ICD-10-CM

## 2020-02-11 DIAGNOSIS — N183 Chronic kidney disease, stage 3 unspecified: Secondary | ICD-10-CM

## 2020-02-11 DIAGNOSIS — Z6830 Body mass index (BMI) 30.0-30.9, adult: Secondary | ICD-10-CM

## 2020-02-11 DIAGNOSIS — E1122 Type 2 diabetes mellitus with diabetic chronic kidney disease: Secondary | ICD-10-CM | POA: Diagnosis present

## 2020-02-11 DIAGNOSIS — I5041 Acute combined systolic (congestive) and diastolic (congestive) heart failure: Secondary | ICD-10-CM

## 2020-02-11 DIAGNOSIS — D696 Thrombocytopenia, unspecified: Secondary | ICD-10-CM | POA: Diagnosis present

## 2020-02-11 DIAGNOSIS — R7989 Other specified abnormal findings of blood chemistry: Secondary | ICD-10-CM | POA: Diagnosis present

## 2020-02-11 DIAGNOSIS — I400 Infective myocarditis: Secondary | ICD-10-CM

## 2020-02-11 DIAGNOSIS — E1165 Type 2 diabetes mellitus with hyperglycemia: Secondary | ICD-10-CM | POA: Diagnosis present

## 2020-02-11 DIAGNOSIS — I10 Essential (primary) hypertension: Secondary | ICD-10-CM | POA: Diagnosis present

## 2020-02-11 DIAGNOSIS — E785 Hyperlipidemia, unspecified: Secondary | ICD-10-CM | POA: Diagnosis present

## 2020-02-11 DIAGNOSIS — R7401 Elevation of levels of liver transaminase levels: Secondary | ICD-10-CM | POA: Diagnosis present

## 2020-02-11 DIAGNOSIS — E872 Acidosis, unspecified: Secondary | ICD-10-CM

## 2020-02-11 DIAGNOSIS — E669 Obesity, unspecified: Secondary | ICD-10-CM | POA: Diagnosis present

## 2020-02-11 DIAGNOSIS — Z7952 Long term (current) use of systemic steroids: Secondary | ICD-10-CM

## 2020-02-11 DIAGNOSIS — N184 Chronic kidney disease, stage 4 (severe): Secondary | ICD-10-CM

## 2020-02-11 DIAGNOSIS — T502X5A Adverse effect of carbonic-anhydrase inhibitors, benzothiadiazides and other diuretics, initial encounter: Secondary | ICD-10-CM | POA: Diagnosis not present

## 2020-02-11 DIAGNOSIS — I471 Supraventricular tachycardia: Secondary | ICD-10-CM | POA: Diagnosis present

## 2020-02-11 DIAGNOSIS — E782 Mixed hyperlipidemia: Secondary | ICD-10-CM | POA: Diagnosis present

## 2020-02-11 DIAGNOSIS — E119 Type 2 diabetes mellitus without complications: Secondary | ICD-10-CM

## 2020-02-11 DIAGNOSIS — J1282 Pneumonia due to coronavirus disease 2019: Secondary | ICD-10-CM | POA: Diagnosis present

## 2020-02-11 DIAGNOSIS — Z7982 Long term (current) use of aspirin: Secondary | ICD-10-CM

## 2020-02-11 DIAGNOSIS — Z794 Long term (current) use of insulin: Secondary | ICD-10-CM

## 2020-02-11 DIAGNOSIS — I4891 Unspecified atrial fibrillation: Secondary | ICD-10-CM | POA: Diagnosis present

## 2020-02-11 DIAGNOSIS — Z85828 Personal history of other malignant neoplasm of skin: Secondary | ICD-10-CM

## 2020-02-11 DIAGNOSIS — N1831 Chronic kidney disease, stage 3a: Secondary | ICD-10-CM | POA: Diagnosis present

## 2020-02-11 DIAGNOSIS — Y830 Surgical operation with transplant of whole organ as the cause of abnormal reaction of the patient, or of later complication, without mention of misadventure at the time of the procedure: Secondary | ICD-10-CM | POA: Diagnosis present

## 2020-02-11 HISTORY — DX: Type 2 diabetes mellitus without complications: E11.9

## 2020-02-11 HISTORY — DX: Kidney transplant status: Z94.0

## 2020-02-11 NOTE — ED Triage Notes (Signed)
Heaviness in his chest when lies back, started this morning. Some shortness of breath.

## 2020-02-12 ENCOUNTER — Inpatient Hospital Stay (HOSPITAL_COMMUNITY): Payer: Medicare Other

## 2020-02-12 ENCOUNTER — Encounter (HOSPITAL_COMMUNITY): Payer: Self-pay | Admitting: Internal Medicine

## 2020-02-12 DIAGNOSIS — N179 Acute kidney failure, unspecified: Secondary | ICD-10-CM | POA: Diagnosis present

## 2020-02-12 DIAGNOSIS — R7989 Other specified abnormal findings of blood chemistry: Secondary | ICD-10-CM | POA: Diagnosis present

## 2020-02-12 DIAGNOSIS — R778 Other specified abnormalities of plasma proteins: Secondary | ICD-10-CM | POA: Diagnosis not present

## 2020-02-12 DIAGNOSIS — E872 Acidosis, unspecified: Secondary | ICD-10-CM

## 2020-02-12 DIAGNOSIS — E1165 Type 2 diabetes mellitus with hyperglycemia: Secondary | ICD-10-CM | POA: Diagnosis present

## 2020-02-12 DIAGNOSIS — I130 Hypertensive heart and chronic kidney disease with heart failure and stage 1 through stage 4 chronic kidney disease, or unspecified chronic kidney disease: Secondary | ICD-10-CM | POA: Diagnosis present

## 2020-02-12 DIAGNOSIS — J9601 Acute respiratory failure with hypoxia: Secondary | ICD-10-CM

## 2020-02-12 DIAGNOSIS — J9600 Acute respiratory failure, unspecified whether with hypoxia or hypercapnia: Secondary | ICD-10-CM | POA: Diagnosis not present

## 2020-02-12 DIAGNOSIS — J1282 Pneumonia due to coronavirus disease 2019: Secondary | ICD-10-CM | POA: Diagnosis present

## 2020-02-12 DIAGNOSIS — I10 Essential (primary) hypertension: Secondary | ICD-10-CM | POA: Diagnosis not present

## 2020-02-12 DIAGNOSIS — Z940 Kidney transplant status: Secondary | ICD-10-CM | POA: Diagnosis not present

## 2020-02-12 DIAGNOSIS — E1122 Type 2 diabetes mellitus with diabetic chronic kidney disease: Secondary | ICD-10-CM | POA: Diagnosis present

## 2020-02-12 DIAGNOSIS — Z7982 Long term (current) use of aspirin: Secondary | ICD-10-CM | POA: Diagnosis not present

## 2020-02-12 DIAGNOSIS — T501X5A Adverse effect of loop [high-ceiling] diuretics, initial encounter: Secondary | ICD-10-CM | POA: Diagnosis present

## 2020-02-12 DIAGNOSIS — R0902 Hypoxemia: Secondary | ICD-10-CM | POA: Diagnosis present

## 2020-02-12 DIAGNOSIS — D696 Thrombocytopenia, unspecified: Secondary | ICD-10-CM | POA: Diagnosis present

## 2020-02-12 DIAGNOSIS — N1831 Chronic kidney disease, stage 3a: Secondary | ICD-10-CM | POA: Diagnosis present

## 2020-02-12 DIAGNOSIS — R0789 Other chest pain: Secondary | ICD-10-CM

## 2020-02-12 DIAGNOSIS — N183 Chronic kidney disease, stage 3 unspecified: Secondary | ICD-10-CM

## 2020-02-12 DIAGNOSIS — Y92239 Unspecified place in hospital as the place of occurrence of the external cause: Secondary | ICD-10-CM | POA: Diagnosis present

## 2020-02-12 DIAGNOSIS — E119 Type 2 diabetes mellitus without complications: Secondary | ICD-10-CM | POA: Diagnosis not present

## 2020-02-12 DIAGNOSIS — E669 Obesity, unspecified: Secondary | ICD-10-CM | POA: Diagnosis present

## 2020-02-12 DIAGNOSIS — Z6830 Body mass index (BMI) 30.0-30.9, adult: Secondary | ICD-10-CM | POA: Diagnosis not present

## 2020-02-12 DIAGNOSIS — N184 Chronic kidney disease, stage 4 (severe): Secondary | ICD-10-CM

## 2020-02-12 DIAGNOSIS — I5043 Acute on chronic combined systolic (congestive) and diastolic (congestive) heart failure: Secondary | ICD-10-CM | POA: Diagnosis present

## 2020-02-12 DIAGNOSIS — U071 COVID-19: Secondary | ICD-10-CM | POA: Diagnosis present

## 2020-02-12 DIAGNOSIS — I5031 Acute diastolic (congestive) heart failure: Secondary | ICD-10-CM

## 2020-02-12 DIAGNOSIS — Z85828 Personal history of other malignant neoplasm of skin: Secondary | ICD-10-CM | POA: Diagnosis not present

## 2020-02-12 DIAGNOSIS — Y830 Surgical operation with transplant of whole organ as the cause of abnormal reaction of the patient, or of later complication, without mention of misadventure at the time of the procedure: Secondary | ICD-10-CM | POA: Diagnosis present

## 2020-02-12 DIAGNOSIS — I471 Supraventricular tachycardia: Secondary | ICD-10-CM | POA: Diagnosis present

## 2020-02-12 DIAGNOSIS — R7401 Elevation of levels of liver transaminase levels: Secondary | ICD-10-CM | POA: Diagnosis present

## 2020-02-12 DIAGNOSIS — E871 Hypo-osmolality and hyponatremia: Secondary | ICD-10-CM | POA: Diagnosis present

## 2020-02-12 DIAGNOSIS — I519 Heart disease, unspecified: Secondary | ICD-10-CM | POA: Diagnosis not present

## 2020-02-12 DIAGNOSIS — I5041 Acute combined systolic (congestive) and diastolic (congestive) heart failure: Secondary | ICD-10-CM | POA: Diagnosis not present

## 2020-02-12 DIAGNOSIS — T8619 Other complication of kidney transplant: Secondary | ICD-10-CM | POA: Diagnosis present

## 2020-02-12 DIAGNOSIS — E782 Mixed hyperlipidemia: Secondary | ICD-10-CM | POA: Diagnosis present

## 2020-02-12 DIAGNOSIS — I4891 Unspecified atrial fibrillation: Secondary | ICD-10-CM | POA: Diagnosis present

## 2020-02-12 DIAGNOSIS — I214 Non-ST elevation (NSTEMI) myocardial infarction: Secondary | ICD-10-CM | POA: Diagnosis present

## 2020-02-12 HISTORY — DX: Acute respiratory failure with hypoxia: J96.01

## 2020-02-12 LAB — MAGNESIUM: Magnesium: 1.8 mg/dL (ref 1.7–2.4)

## 2020-02-12 LAB — GLUCOSE, CAPILLARY
Glucose-Capillary: 259 mg/dL — ABNORMAL HIGH (ref 70–99)
Glucose-Capillary: 407 mg/dL — ABNORMAL HIGH (ref 70–99)
Glucose-Capillary: 423 mg/dL — ABNORMAL HIGH (ref 70–99)
Glucose-Capillary: 81 mg/dL (ref 70–99)
Glucose-Capillary: 106 mg/dL — ABNORMAL HIGH (ref 70–99)

## 2020-02-12 LAB — HEPATIC FUNCTION PANEL
ALT: 23 U/L (ref 0–44)
AST: 34 U/L (ref 15–41)
Albumin: 3.6 g/dL (ref 3.5–5.0)
Alkaline Phosphatase: 51 U/L (ref 38–126)
Bilirubin, Direct: 0.2 mg/dL (ref 0.0–0.2)
Indirect Bilirubin: 0.6 mg/dL (ref 0.3–0.9)
Total Bilirubin: 0.8 mg/dL (ref 0.3–1.2)
Total Protein: 6.6 g/dL (ref 6.5–8.1)

## 2020-02-12 LAB — PHOSPHORUS: Phosphorus: 2.4 mg/dL — ABNORMAL LOW (ref 2.5–4.6)

## 2020-02-12 LAB — CBC
HCT: 46 % (ref 39.0–52.0)
Hemoglobin: 15.7 g/dL (ref 13.0–17.0)
MCH: 29.5 pg (ref 26.0–34.0)
MCHC: 34.1 g/dL (ref 30.0–36.0)
MCV: 86.5 fL (ref 80.0–100.0)
Platelets: 102 10*3/uL — ABNORMAL LOW (ref 150–400)
RBC: 5.32 MIL/uL (ref 4.22–5.81)
RDW: 12.5 % (ref 11.5–15.5)
WBC: 4.8 10*3/uL (ref 4.0–10.5)
nRBC: 0 % (ref 0.0–0.2)

## 2020-02-12 LAB — LACTIC ACID, PLASMA
Lactic Acid, Venous: 1 mmol/L (ref 0.5–1.9)
Lactic Acid, Venous: 2.2 mmol/L (ref 0.5–1.9)
Lactic Acid, Venous: 1.2 mmol/L (ref 0.5–1.9)
Lactic Acid, Venous: 1.5 mmol/L (ref 0.5–1.9)

## 2020-02-12 LAB — D-DIMER, QUANTITATIVE: D-Dimer, Quant: 0.68 ug/mL-FEU — ABNORMAL HIGH (ref 0.00–0.50)

## 2020-02-12 LAB — TROPONIN I (HIGH SENSITIVITY)
Troponin I (High Sensitivity): 372 ng/L (ref <18)
Troponin I (High Sensitivity): 395 ng/L (ref <18)
Troponin I (High Sensitivity): 4887 ng/L (ref <18)
Troponin I (High Sensitivity): 8393 ng/L (ref <18)
Troponin I (High Sensitivity): 8490 ng/L (ref <18)
Troponin I (High Sensitivity): 8647 ng/L (ref <18)

## 2020-02-12 LAB — HEMOGLOBIN A1C
Hgb A1c MFr Bld: 6.9 % — ABNORMAL HIGH (ref 4.8–5.6)
Mean Plasma Glucose: 151.33 mg/dL

## 2020-02-12 LAB — FIBRINOGEN: Fibrinogen: 425 mg/dL (ref 210–475)

## 2020-02-12 LAB — ECHOCARDIOGRAM COMPLETE
Area-P 1/2: 5.27 cm2
Calc EF: 50.3 %
Height: 70 in
S' Lateral: 4.85 cm
Single Plane A2C EF: 40.9 %
Single Plane A4C EF: 58.3 %
Weight: 3396.85 oz

## 2020-02-12 LAB — PROCALCITONIN: Procalcitonin: <0.1 ng/mL

## 2020-02-12 LAB — BASIC METABOLIC PANEL
Anion gap: 10 (ref 5–15)
BUN: 20 mg/dL (ref 8–23)
CO2: 21 mmol/L — ABNORMAL LOW (ref 22–32)
Calcium: 8.6 mg/dL — ABNORMAL LOW (ref 8.9–10.3)
Chloride: 98 mmol/L (ref 98–111)
Creatinine, Ser: 1.52 mg/dL — ABNORMAL HIGH (ref 0.61–1.24)
GFR, Estimated: 48 mL/min — ABNORMAL LOW (ref >60)
Glucose, Bld: 198 mg/dL — ABNORMAL HIGH (ref 70–99)
Potassium: 3.9 mmol/L (ref 3.5–5.1)
Sodium: 129 mmol/L — ABNORMAL LOW (ref 135–145)

## 2020-02-12 LAB — LACTATE DEHYDROGENASE: LDH: 207 U/L — ABNORMAL HIGH (ref 98–192)

## 2020-02-12 LAB — TRIGLYCERIDES: Triglycerides: 98 mg/dL (ref <150)

## 2020-02-12 LAB — POC SARS CORONAVIRUS 2 AG -  ED: SARS Coronavirus 2 Ag: POSITIVE — AB

## 2020-02-12 LAB — FERRITIN: Ferritin: 117 ng/mL (ref 24–336)

## 2020-02-12 LAB — BRAIN NATRIURETIC PEPTIDE: B Natriuretic Peptide: 842 pg/mL — ABNORMAL HIGH (ref 0.0–100.0)

## 2020-02-12 LAB — C-REACTIVE PROTEIN: CRP: 2.4 mg/dL — ABNORMAL HIGH (ref <1.0)

## 2020-02-12 MED ORDER — SODIUM CHLORIDE 0.9 % IV SOLN: 100.0000 mg | Freq: Every day | INTRAVENOUS | Status: DC

## 2020-02-12 MED ORDER — DEXAMETHASONE SODIUM PHOSPHATE 10 MG/ML IJ SOLN
10.0000 mg | Freq: Once | INTRAMUSCULAR | Status: AC
  Administered 2020-02-12: 10 mg via INTRAVENOUS
  Filled 2020-02-12: qty 1

## 2020-02-12 MED ORDER — SODIUM CHLORIDE 0.9 % IV SOLN: 200.0000 mg | Freq: Once | INTRAVENOUS | Status: DC

## 2020-02-12 MED ORDER — ACETAMINOPHEN 325 MG PO TABS
650.0000 mg | ORAL_TABLET | Freq: Four times a day (QID) | ORAL | Status: DC | PRN
  Filled 2020-02-12 (×3): qty 2

## 2020-02-12 MED ORDER — AMLODIPINE BESYLATE 10 MG PO TABS
10.0000 mg | ORAL_TABLET | Freq: Every day | ORAL | Status: DC
  Administered 2020-02-12 – 2020-02-16 (×5): 10 mg via ORAL
  Filled 2020-02-12: qty 2
  Filled 2020-02-12 (×4): qty 1

## 2020-02-12 MED ORDER — MYCOPHENOLATE MOFETIL 250 MG PO CAPS
500.0000 mg | ORAL_CAPSULE | Freq: Two times a day (BID) | ORAL | Status: DC
  Administered 2020-02-12 – 2020-02-18 (×12): 500 mg via ORAL
  Filled 2020-02-12 (×13): qty 2

## 2020-02-12 MED ORDER — METHYLPREDNISOLONE SODIUM SUCC 40 MG IJ SOLR
40.0000 mg | Freq: Two times a day (BID) | INTRAMUSCULAR | Status: AC
  Administered 2020-02-12 – 2020-02-14 (×6): 40 mg via INTRAVENOUS
  Filled 2020-02-12 (×6): qty 1

## 2020-02-12 MED ORDER — ONDANSETRON HCL 4 MG PO TABS
4.0000 mg | ORAL_TABLET | Freq: Four times a day (QID) | ORAL | Status: DC | PRN
  Filled 2020-02-12: qty 1

## 2020-02-12 MED ORDER — SODIUM CHLORIDE 0.9 % IV SOLN
100.0000 mg | INTRAVENOUS | Status: AC
  Administered 2020-02-12 (×2): 100 mg via INTRAVENOUS
  Filled 2020-02-12: qty 20

## 2020-02-12 MED ORDER — TACROLIMUS 1 MG PO CAPS
1.0000 mg | ORAL_CAPSULE | Freq: Two times a day (BID) | ORAL | Status: DC
  Administered 2020-02-12 – 2020-02-18 (×12): 1 mg via ORAL
  Filled 2020-02-12 (×12): qty 1

## 2020-02-12 MED ORDER — ASCORBIC ACID 500 MG PO TABS
500.0000 mg | ORAL_TABLET | Freq: Every day | ORAL | Status: DC
  Administered 2020-02-12 – 2020-02-18 (×7): 500 mg via ORAL
  Filled 2020-02-12 (×7): qty 1

## 2020-02-12 MED ORDER — SODIUM CHLORIDE 0.9 % IV SOLN
100.0000 mg | Freq: Every day | INTRAVENOUS | Status: AC
  Administered 2020-02-13 – 2020-02-16 (×4): 100 mg via INTRAVENOUS
  Filled 2020-02-12 (×2): qty 100
  Filled 2020-02-12: qty 20
  Filled 2020-02-12: qty 100

## 2020-02-12 MED ORDER — INSULIN ASPART 100 UNIT/ML ~~LOC~~ SOLN
0.0000 [IU] | SUBCUTANEOUS | Status: DC
  Administered 2020-02-12 – 2020-02-13 (×3): 20 [IU] via SUBCUTANEOUS
  Administered 2020-02-13 (×3): 3 [IU] via SUBCUTANEOUS
  Administered 2020-02-14 (×2): 7 [IU] via SUBCUTANEOUS
  Administered 2020-02-14: 4 [IU] via SUBCUTANEOUS
  Administered 2020-02-14 (×2): 3 [IU] via SUBCUTANEOUS
  Administered 2020-02-14 – 2020-02-15 (×2): 4 [IU] via SUBCUTANEOUS
  Administered 2020-02-15 (×4): 7 [IU] via SUBCUTANEOUS
  Administered 2020-02-16: 4 [IU] via SUBCUTANEOUS
  Administered 2020-02-16: 7 [IU] via SUBCUTANEOUS
  Administered 2020-02-16: 4 [IU] via SUBCUTANEOUS
  Administered 2020-02-16: 3 [IU] via SUBCUTANEOUS
  Administered 2020-02-16: 11 [IU] via SUBCUTANEOUS
  Administered 2020-02-16: 7 [IU] via SUBCUTANEOUS
  Administered 2020-02-17: 4 [IU] via SUBCUTANEOUS
  Administered 2020-02-17: 7 [IU] via SUBCUTANEOUS
  Administered 2020-02-17: 11 [IU] via SUBCUTANEOUS
  Administered 2020-02-17: 20 [IU] via SUBCUTANEOUS
  Administered 2020-02-18: 15 [IU] via SUBCUTANEOUS
  Administered 2020-02-18: 11 [IU] via SUBCUTANEOUS

## 2020-02-12 MED ORDER — METOPROLOL TARTRATE 25 MG PO TABS
25.0000 mg | ORAL_TABLET | Freq: Two times a day (BID) | ORAL | Status: DC
  Administered 2020-02-12 – 2020-02-17 (×11): 25 mg via ORAL
  Filled 2020-02-12 (×11): qty 1

## 2020-02-12 MED ORDER — ENOXAPARIN SODIUM 40 MG/0.4ML ~~LOC~~ SOLN: 40.0000 mg | SUBCUTANEOUS | Status: DC

## 2020-02-12 MED ORDER — INSULIN ASPART 100 UNIT/ML ~~LOC~~ SOLN: 0.0000 [IU] | Freq: Every day | SUBCUTANEOUS | Status: DC

## 2020-02-12 MED ORDER — LINAGLIPTIN 5 MG PO TABS
5.0000 mg | ORAL_TABLET | Freq: Every day | ORAL | Status: DC
  Administered 2020-02-12 – 2020-02-18 (×7): 5 mg via ORAL
  Filled 2020-02-12 (×9): qty 1

## 2020-02-12 MED ORDER — ENOXAPARIN SODIUM 100 MG/ML ~~LOC~~ SOLN
100.0000 mg | Freq: Once | SUBCUTANEOUS | Status: AC
  Administered 2020-02-12: 100 mg via SUBCUTANEOUS
  Filled 2020-02-12: qty 1

## 2020-02-12 MED ORDER — GUAIFENESIN-DM 100-10 MG/5ML PO SYRP
10.0000 mL | ORAL_SOLUTION | ORAL | Status: DC | PRN
  Administered 2020-02-15 – 2020-02-17 (×3): 10 mL via ORAL
  Filled 2020-02-12 (×3): qty 10

## 2020-02-12 MED ORDER — ALBUTEROL SULFATE HFA 108 (90 BASE) MCG/ACT IN AERS
6.0000 | INHALATION_SPRAY | Freq: Once | RESPIRATORY_TRACT | Status: AC
  Administered 2020-02-12: 6 via RESPIRATORY_TRACT
  Filled 2020-02-12: qty 6.7

## 2020-02-12 MED ORDER — INSULIN DETEMIR 100 UNIT/ML ~~LOC~~ SOLN
0.0750 [IU]/kg | Freq: Two times a day (BID) | SUBCUTANEOUS | Status: DC
  Administered 2020-02-12 – 2020-02-18 (×13): 7 [IU] via SUBCUTANEOUS
  Filled 2020-02-12 (×17): qty 0.07

## 2020-02-12 MED ORDER — AEROCHAMBER Z-STAT PLUS/MEDIUM MISC
1.0000 | Freq: Once | Status: DC
  Filled 2020-02-12: qty 1

## 2020-02-12 MED ORDER — ASPIRIN EC 81 MG PO TBEC
81.0000 mg | DELAYED_RELEASE_TABLET | Freq: Every day | ORAL | Status: DC
  Administered 2020-02-12 – 2020-02-18 (×7): 81 mg via ORAL
  Filled 2020-02-12 (×7): qty 1

## 2020-02-12 MED ORDER — PRAVASTATIN SODIUM 40 MG PO TABS
80.0000 mg | ORAL_TABLET | Freq: Every day | ORAL | Status: DC
  Administered 2020-02-12 – 2020-02-13 (×2): 80 mg via ORAL
  Filled 2020-02-12 (×2): qty 2

## 2020-02-12 MED ORDER — PREDNISONE 50 MG PO TABS
50.0000 mg | ORAL_TABLET | Freq: Every day | ORAL | Status: DC
  Administered 2020-02-15 – 2020-02-18 (×4): 50 mg via ORAL
  Filled 2020-02-12 (×4): qty 1

## 2020-02-12 MED ORDER — PANTOPRAZOLE SODIUM 40 MG PO TBEC
40.0000 mg | DELAYED_RELEASE_TABLET | Freq: Every day | ORAL | Status: DC
  Administered 2020-02-12 – 2020-02-18 (×7): 40 mg via ORAL
  Filled 2020-02-12 (×7): qty 1

## 2020-02-12 MED ORDER — ONDANSETRON HCL 4 MG/2ML IJ SOLN: 4.0000 mg | Freq: Four times a day (QID) | INTRAMUSCULAR | Status: DC | PRN

## 2020-02-12 MED ORDER — HEPARIN BOLUS VIA INFUSION
4000.0000 [IU] | Freq: Once | INTRAVENOUS | Status: AC
  Administered 2020-02-12: 4000 [IU] via INTRAVENOUS
  Filled 2020-02-12: qty 4000

## 2020-02-12 MED ORDER — ALBUTEROL SULFATE HFA 108 (90 BASE) MCG/ACT IN AERS
2.0000 | INHALATION_SPRAY | Freq: Four times a day (QID) | RESPIRATORY_TRACT | Status: DC | PRN
  Administered 2020-02-15 – 2020-02-16 (×2): 2 via RESPIRATORY_TRACT
  Filled 2020-02-12: qty 6.7

## 2020-02-12 MED ORDER — INSULIN ASPART 100 UNIT/ML ~~LOC~~ SOLN
0.0000 [IU] | Freq: Three times a day (TID) | SUBCUTANEOUS | Status: DC
  Administered 2020-02-12: 5 [IU] via SUBCUTANEOUS

## 2020-02-12 MED ORDER — HEPARIN (PORCINE) 25000 UT/250ML-% IV SOLN
850.0000 [IU]/h | INTRAVENOUS | Status: AC
  Administered 2020-02-12: 1150 [IU]/h via INTRAVENOUS
  Filled 2020-02-12 (×4): qty 250

## 2020-02-12 MED ORDER — ZINC SULFATE 220 (50 ZN) MG PO CAPS
220.0000 mg | ORAL_CAPSULE | Freq: Every day | ORAL | Status: DC
  Administered 2020-02-12 – 2020-02-18 (×7): 220 mg via ORAL
  Filled 2020-02-12 (×7): qty 1

## 2020-02-12 MED ORDER — HYDROCOD POLST-CPM POLST ER 10-8 MG/5ML PO SUER
5.0000 mL | Freq: Two times a day (BID) | ORAL | Status: DC | PRN
  Administered 2020-02-15 – 2020-02-16 (×2): 5 mL via ORAL
  Filled 2020-02-12 (×4): qty 5

## 2020-02-12 MED ORDER — ALBUTEROL SULFATE HFA 108 (90 BASE) MCG/ACT IN AERS
2.0000 | INHALATION_SPRAY | Freq: Four times a day (QID) | RESPIRATORY_TRACT | Status: DC
  Administered 2020-02-12 (×2): 2 via RESPIRATORY_TRACT

## 2020-02-12 MED ORDER — FUROSEMIDE 10 MG/ML IJ SOLN
60.0000 mg | Freq: Once | INTRAMUSCULAR | Status: AC
  Administered 2020-02-12: 60 mg via INTRAVENOUS
  Filled 2020-02-12: qty 6

## 2020-02-12 NOTE — ED Notes (Signed)
Date and time results received: 02/12/20 0211 (use smartphrase ".now" to insert current time)  Test: lactic acid Critical Value: 2.2  Name of Provider Notified: Dr Tomi Bamberger  Orders Received? Or Actions Taken?: Actions Taken: no orders received

## 2020-02-12 NOTE — ED Provider Notes (Signed)
[facility] EMERGENCY DEPARTMENT Provider Note   CSN: [id number] Arrival date & time: 02/11/20  2303   Time seen 12:18 AM  History Chief Complaint  Patient presents with  . Chest Pain    Daniel Myers is a 75 y.o. male.  HPI   Patient states yesterday morning, January 2 he started having shortness of breath.  He states when he lays down he gets a chest heaviness that he states is "like an elephant sitting on my chest".  He states that as soon as he sits up it goes away.  He estimates it would last about 15 minutes.  He states he has never had this happen before.  He denies feeling short of breath although he looks like he has shortness of breath.  He denies coughing although he does cough during his exam.  He has never had wheezing before and he is not sure if he is having wheezing now.  He denies any swelling of his extremities and has had some mild nausea off and on but no vomiting.  He states he has been having some low-grade fever.  He has not been around anybody else who is sick.  He is not on oxygen at home.  Patient has not had any Covid vaccine  PCP Claretta Fraise, MD   Past Medical History:  Diagnosis Date  . Basal cell carcinoma 06/27/2013   nodular on left jawline - excision  . Basal cell carcinoma 07/01/2014   nodular on left hawling - CX3+5FU+excision  . Basal cell carcinoma 10/10/2014   nodular on right forearm, middle - tx p bx  . Basal cell carcinoma 02/11/2015   left neck - CX3 + excision  . Basal cell carcinoma 06/14/2016   left jawline - CX3+5FU  . Basal cell carcinoma 02/22/2017   superficial and nodular on left neck - excision  . Basal cell carcinoma 04/11/2018   superficial and nodular on right inferior forearm - CX3+Cautery+5FU  . Chronic kidney disease   . Diabetes mellitus without complication ([place])   . Hyperlipidemia   . Hypertension   . 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 Squamous cell carcinoma of skin  08/05/2010   hypertrophic on left ear - CX3+5FU  . 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 Squamous cell carcinoma of skin 11/08/2011   right upper forearm - tx p bx  . Squamous cell carcinoma of skin 11/08/2011   left upper forearm - tx p bx  . Squamous cell carcinoma of skin 11/08/2011   left hand - tx p bx  . 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/27/2013   in situ on left lower back - CX3+5FU  . 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/27/2013   in situ on left forehead - CX3+5FU  . 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/27/2013   in situ on right temple - watch per ST  . 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/27/2013   in situ on right forearm - CX3+5FU  . Squamous cell carcinoma of skin 07/01/2014   well differentiated on right sideburn - CX3+5FU  . Squamous cell carcinoma of skin 07/01/2014   in situ on left shoulder - CX3+5FU  . Squamous cell carcinoma of skin 07/01/2014   in situ on right forearm, proximal - CX3+5FU+Cautery  . Squamous cell carcinoma of skin 07/01/2014   in situ on right forearm, distal - CX3+5FU  .  Squamous cell carcinoma of skin 09/30/2015   in situ on posterior left ear - CX3+5FU  . Squamous cell carcinoma of skin 02/22/2017   in situ on right upper arm - CX3+5FU  . Squamous cell carcinoma of skin 02/22/2017   in situ on left upper arm - CX3+5FU  . Squamous cell carcinoma of skin 04/11/2018   in situ on right temple - CX3+5FU  . Squamous cell carcinoma of skin 04/11/2018   in situ on lateral right arm - MOHs  . Squamous cell carcinoma of skin 04/11/2018   in situ on right upper arm - MOHs  . Squamous cell carcinoma of skin 04/11/2018   in situ on left sideburn  . Squamous cell carcinoma of skin 04/11/2018   in situ on right flank - tx p bx  . Squamous cell carcinoma of skin 09/28/2018   in situ on right inner ear - tx p bx  . Squamous cell carcinoma of skin 09/28/2018   in situ on left inner ear - tx p bx  . Squamous  cell carcinoma of skin 09/28/2018   in situ on right arm - tx p bx  . Squamous cell carcinoma of skin 03/21/2019   in situ on right antihelix (Mitkov treated topically)  . Squamous cell carcinoma of skin 03/21/2019   in situ on right neck - CX3+5FU  . Squamous cell carcinoma of skin 03/21/2019   in situ on left outer eye, inf (MOHs done 05/23/2019)    Patient Active Problem List   Diagnosis Date Noted  . Essential hypertension 11/22/2018  . Insulin dependent type 2 diabetes mellitus ([place]) 11/22/2018  . 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 Mixed hyperlipidemia 11/22/2018  . Vision decreased 11/22/2018  . Hyponatremia 11/22/2018  . Seasonal allergic rhinitis due to pollen 11/22/2018    Past Surgical History:  Procedure Laterality Date  . KIDNEY TRANSPLANT Right        Family History  Problem Relation Age of Onset  . Clotting disorder Mother   . Hypertension Sister   . Diabetes Sister     Social History   Tobacco Use  . Smoking status: Current Some Day Smoker    Types: Pipe  . Smokeless tobacco: Never Used  Vaping Use  . Vaping Use: Never used  Substance Use Topics  . Alcohol use: Never  . Drug use: Never  lives at home  Home Medications Prior to Admission medications   Medication Sig Start Date End Date Taking? Authorizing Provider  amLODipine (NORVASC) 10 MG tablet Take 1 tablet by mouth once daily 10/30/19   Claretta Fraise, MD  aspirin EC 81 MG tablet Take by mouth. 11/07/06   [provider]  blood glucose meter kit and supplies Dispense based on patient and insurance preference. Use up to four times daily as directed. (FOR ICD-10 E10.9, E11.9). 10/08/19   Claretta Fraise, MD  fexofenadine-pseudoephedrine (ALLEGRA-D 24) 180-240 MG 24 hr tablet Take 1 tablet by mouth every evening. For allergy and congestion 03/14/19   Claretta Fraise, MD  fluorouracil (EFUDEX) 5 % cream APPLY A SMALL AMOUNT OF CREAM TOPICALLY TWICE DAILY TO RIGHT EAR FOR 3 WEEKS 04/16/19    [provider]  fluticasone (FLONASE) 50 MCG/ACT nasal spray USE ONE SPRAY(S) IN EACH NOSTRIL ONCE DAILY 03/14/19   Claretta Fraise, MD  furosemide (LASIX) 40 MG tablet Take 40 mg by mouth daily. 09/30/18   [provider]  insulin NPH Human (NOVOLIN N) 100 UNIT/ML injection 30 units sq  AC breakfast, 40 units sq ac supper 03/14/19   Claretta Fraise, MD  Insulin Regular Human 100 UNIT/ML SOPN Inject 12 units before lunch and 7 units before supper. 01/06/20   Claretta Fraise, MD  loratadine (CLARITIN) 10 MG tablet Take by mouth. 10/24/06   [provider]  mycophenolate (CELLCEPT) 250 MG capsule TAKE 2 CAPSULES BY MOUTH TWO TIMES DAILY. 06/10/14   [provider]  omega-3 acid ethyl esters (LOVAZA) 1 g capsule Take 2 capsules (2 g total) by mouth 2 (two) times daily. 03/14/19   Claretta Fraise, MD  pravastatin (PRAVACHOL) 80 MG tablet TAKE 1 TABLET BY MOUTH ONCE DAILY IN THE MORNING 03/14/19   Claretta Fraise, MD  predniSONE (DELTASONE) 5 MG tablet Take 5 mg by mouth daily. 10/23/18   [provider]  [place] X 15/64" 0.5 ML MISC USE AS DIRECTED FOR INSULIN 07/04/18   [provider]  rosuvastatin (CRESTOR) 10 MG tablet Take 1 tablet (10 mg total) by mouth daily. For cholesterol Patient not taking: Reported on 01/02/2020 10/03/19   Claretta Fraise, MD  sodium bicarbonate 650 MG tablet Take 650 mg by mouth 2 (two) times daily. 09/26/18   [provider]  tacrolimus (PROGRAF) 0.5 MG capsule TAKE 2 CAPSULES BY MOUTH TWO TIMES DAILY. 07/08/15   [provider]    Allergies    Other, Tape, and Sulfur  Review of Systems   Review of Systems  All other systems reviewed and are negative.   Physical Exam Updated Vital Signs BP (!) 146/71   Pulse 96   Temp 99.4 F (37.4 C) (Oral)   Resp (!) 31   Ht '5\' 10"'  (1.778 m)   Wt 94.3 kg   SpO2 99%   BMI 29.84 kg/m   Physical Exam Vitals and nursing note reviewed.  Constitutional:       General: He is not in acute distress.    Appearance: Normal appearance. He is obese.  HENT:     Head: Normocephalic and atraumatic.     Right Ear: External ear normal.     Left Ear: External ear normal.     Nose: Nose normal.     Mouth/Throat:     Mouth: Mucous membranes are moist.  Eyes:     Conjunctiva/sclera: Conjunctivae normal.     Pupils: Pupils are equal, round, and reactive to light.  Cardiovascular:     Rate and Rhythm: Regular rhythm. Tachycardia present.     Pulses: Normal pulses.     Heart sounds: Normal heart sounds. No murmur heard.   Pulmonary:     Effort: Tachypnea present. No accessory muscle usage or prolonged expiration.     Breath sounds: Examination of the right-lower field reveals decreased breath sounds. Examination of the left-lower field reveals decreased breath sounds. Decreased breath sounds present.  Musculoskeletal:        General: Normal range of motion.     Cervical back: Normal range of motion.     Right lower leg: Edema present.     Left lower leg: Edema present.     Comments: Patient has 1+ pitting edema halfway up his legs bilaterally  Skin:    General: Skin is warm and dry.  Neurological:     General: No focal deficit present.     Mental Status: He is alert and oriented to person, place, and time.     Cranial Nerves: No cranial nerve deficit.  Psychiatric:  Mood and Affect: Mood normal.        Behavior: Behavior normal.        Thought Content: Thought content normal.     ED Results / Procedures / Treatments   Labs (all labs ordered are listed, but only abnormal results are displayed) Results for orders placed or performed during the hospital encounter of 79/89/21  Basic metabolic panel  Result Value Ref Range   Sodium 129 (L) 135 - 145 mmol/L   Potassium 3.9 3.5 - 5.1 mmol/L   Chloride 98 98 - 111 mmol/L   CO2 21 (L) 22 - 32 mmol/L   Glucose, Bld 198 (H) 70 - 99 mg/dL   BUN 20 8 - 23 mg/dL   Creatinine, Ser 1.52 (H)  0.61 - 1.24 mg/dL   Calcium 8.6 (L) 8.9 - 10.3 mg/dL   GFR, Estimated 48 (L) >60 mL/min   Anion gap 10 5 - 15  CBC  Result Value Ref Range   WBC 4.8 4.0 - 10.5 K/uL   RBC 5.32 4.22 - 5.81 MIL/uL   Hemoglobin 15.7 13.0 - 17.0 g/dL   HCT 46.0 39.0 - 52.0 %   MCV 86.5 80.0 - 100.0 fL   MCH 29.5 26.0 - 34.0 pg   MCHC 34.1 30.0 - 36.0 g/dL   RDW 12.5 11.5 - 15.5 %   Platelets 102 (L) 150 - 400 K/uL   nRBC 0.0 0.0 - 0.2 %  Brain natriuretic peptide  Result Value Ref Range   B Natriuretic Peptide 842.0 (H) 0.0 - 100.0 pg/mL  D-dimer, quantitative  Result Value Ref Range   D-Dimer, Quant 0.68 (H) 0.00 - 0.50 ug/mL-FEU  Lactic acid, plasma  Result Value Ref Range   Lactic Acid, Venous 1.0 0.5 - 1.9 mmol/L  Lactic acid, plasma  Result Value Ref Range   Lactic Acid, Venous 2.2 (HH) 0.5 - 1.9 mmol/L  Procalcitonin  Result Value Ref Range   Procalcitonin <0.10 ng/mL  Lactate dehydrogenase  Result Value Ref Range   LDH 207 (H) 98 - 192 U/L  Ferritin  Result Value Ref Range   Ferritin 117 24 - 336 ng/mL  Triglycerides  Result Value Ref Range   Triglycerides 98 <150 mg/dL  Fibrinogen  Result Value Ref Range   Fibrinogen 425 210 - 475 mg/dL  C-reactive protein  Result Value Ref Range   CRP 2.4 (H) <1.0 mg/dL  Hepatic function panel  Result Value Ref Range   Total Protein 6.6 6.5 - 8.1 g/dL   Albumin 3.6 3.5 - 5.0 g/dL   AST 34 15 - 41 U/L   ALT 23 0 - 44 U/L   Alkaline Phosphatase 51 38 - 126 U/L   Total Bilirubin 0.8 0.3 - 1.2 mg/dL   Bilirubin, Direct 0.2 0.0 - 0.2 mg/dL   Indirect Bilirubin 0.6 0.3 - 0.9 mg/dL  POC SARS Coronavirus 2 Ag-ED - Nasal Swab (BD Veritor Kit)  Result Value Ref Range   SARS Coronavirus 2 Ag POSITIVE (A) NEGATIVE  Troponin I (High Sensitivity)  Result Value Ref Range   Troponin I (High Sensitivity) 372 (HH) <18 ng/L  Troponin I (High Sensitivity)  Result Value Ref Range   Troponin I (High Sensitivity) 395 (HH) <18 ng/L   Laboratory  interpretation all normal except positive rapid Covid test, hyponatremia, renal insufficiency which is stable, hyperglycemia, thrombocytopenia, elevated troponin, mildly elevated BNP, D-dimer normal when corrected for age    EKG EKG Interpretation  Date/Time:  Monday February 11 2020  23:20:41 EST Ventricular Rate:  93 PR Interval:    QRS Duration: 105 QT Interval:  338 QTC Calculation: 421 R Axis:   90 Text Interpretation: Sinus rhythm Probable inferior infarct, old Anterolateral infarct, age indeterminate Baseline wander in lead(s) I III aVL V2 No significant change since last tracing 30 Jul 2005 Confirmed by Rolland Porter [phone]) on 02/11/2020 11:58:46 PM   Radiology DG Chest Port 1 View  Result Date: 02/11/2020 CLINICAL DATA:  Chest heaviness and shortness of breath. EXAM: PORTABLE CHEST 1 VIEW COMPARISON:  July 30, 2005 FINDINGS: Mild, diffuse, chronic appearing increased interstitial lung markings are seen. There is no evidence of acute infiltrate, pleural effusion or pneumothorax. The cardiac silhouette is borderline in size and stable in appearance. The visualized skeletal structures are unremarkable. IMPRESSION: Chronic appearing increased lung markings without evidence of acute or active cardiopulmonary disease. Electronically Signed   By: Virgina Norfolk M.D.   On: 02/11/2020 23:42    Procedures .Critical Care Performed by: Rolland Porter, MD Authorized by: Rolland Porter, MD   Critical care provider statement:    Critical care time (minutes):  39   Critical care was necessary to treat or prevent imminent or life-threatening deterioration of the following conditions:  Respiratory failure   Critical care was time spent personally by me on the following activities:  Examination of patient, obtaining history from patient or surrogate, ordering and review of laboratory studies, ordering and review of radiographic studies, pulse oximetry, re-evaluation of patient's condition and review of old  charts   Care discussed with: admitting provider     (including critical care time)  Medications Ordered in ED Medications  aerochamber Z-Stat Plus/medium 1 each (1 each Other Not Given 02/12/20 0126)  albuterol (VENTOLIN HFA) 108 (90 Base) MCG/ACT inhaler 6 puff (6 puffs Inhalation Given 02/12/20 0035)  furosemide (LASIX) injection 60 mg (60 mg Intravenous Given 02/12/20 0116)  dexamethasone (DECADRON) injection 10 mg (10 mg Intravenous Given 02/12/20 0116)    ED Course  I have reviewed the triage vital signs and the nursing notes.  Pertinent labs & imaging results that were available during my care of the patient were reviewed by me and considered in my medical decision making (see chart for details).    MDM Rules/Calculators/A&P                          Patient was on 6 L nasal cannula oxygen during my exam.  When he talked his pulse ox dropped to 86% and then when he stopped talking it would be 89.  Respiratory therapy was present and we discussed putting him on high flow oxygen.  Covid testing was done.  His chest x-ray does not reveal the source of his problem, D-dimer and BNP were added.  He may have a component of congestive heart failure with his peripheral edema.  He was given albuterol 6 puffs by inhaler.  He also was given Lasix 60 mg IV.  Respiratory therapy put him on 8 L high flow nasal cannula oxygen.  Patient's rapid Covid test came back positive.  Additional lab work was ordered.  Patient was given Decadron IV.  1:30 AM patient's pulse ox is 98% on the 8 L/min nasal cannula oxygen.  Patient's delta troponin went up to less than 20.  His EKG does not look like an acute myocardial ischemia.  I suspect he may have some myocarditis or some myocardial strain.  He states he only has  the chest discomfort when he lays flat.  This is very atypical for coronary artery disease.   3:02 AM Dr. Josephine Cables, hospitalist will admit.  Daniel Myers was evaluated in Emergency Department on  02/12/2020 for the symptoms described in the history of present illness. He was evaluated in the context of the global COVID-19 pandemic, which necessitated consideration that the patient might be at risk for infection with the SARS-CoV-2 virus that causes COVID-19. Institutional protocols and algorithms that pertain to the evaluation of patients at risk for COVID-19 are in a state of rapid change based on information released by regulatory bodies including the CDC and federal and state organizations. These policies and algorithms were followed during the patient's care in the ED.   Final Clinical Impression(s) / ED Diagnoses Final diagnoses:  Pneumonia due to COVID-19 virus  Hypoxia  Acute viral myocarditis    Rx / DC Orders  Plan admission  Rolland Porter, MD, Barbette Or, MD 02/12/20 [phone]

## 2020-02-12 NOTE — Progress Notes (Signed)
MD notified of patients critical trop level of 4,887.

## 2020-02-12 NOTE — ED Notes (Signed)
Admit Provider at bedside. 

## 2020-02-12 NOTE — ED Notes (Signed)
ED Provider at bedside. 

## 2020-02-12 NOTE — Progress Notes (Signed)
*  PRELIMINARY RESULTS* Echocardiogram 2D Echocardiogram has been performed.  Daniel Myers 02/12/2020, 4:33 PM

## 2020-02-12 NOTE — Progress Notes (Signed)
**Note De-identified  Obfuscation** EKG complete and placed in patient chart 

## 2020-02-12 NOTE — Progress Notes (Signed)
Report given to Ronalee Belts RN at [facility]

## 2020-02-12 NOTE — Progress Notes (Signed)
Per HPI: Daniel Myers is a 75 y.o. male with medical history significant for hypertension, hyperlipidemia, type 2 diabetes mellitus, obesity history of right kidney transplant and prior skin cancer in remission who presents to the emergency department due to 2 day onset of shortness of breath.  He complained of shortness of breath mostly when he lays down in bed, this is associated with a sensation of heaviness on the left side of his chest described as " elephant sitting on my chest", chest pain was nonradiating, nonreproducible and quickly resolves once he sits up.  Shortness of breath does not get worse with ambulation. He denies cough, but endorsed subjective fever yesterday.  He denies leg swelling and vomiting, but endorsed nausea.  Patient denies any sick contacts.  He has not had any Covid vaccine.  -Patient has been admitted with acute hypoxemic respiratory failure secondary to COVID-19 pneumonia and has been started on remdesivir and steroids.  He is also now noted to have NSTEMI with abnormal EKG and troponins that are elevating up to 8490 at this point.  Cardiology has evaluated the patient with recommendations to transfer to [facility] for closer monitoring and consideration of cardiac catheterization.  Patient is currently without chest pain and is otherwise comfortable on 8 L nasal cannula oxygen.  He has been started on IV heparin drip with 2D echocardiogram ordered and pending.  Low-dose beta-blocker, aspirin, Norvasc, and Pravachol have also been ordered by cardiology.  Plan to transfer once bed is available.  Discussed with daughter on the phone on 1/4 who is aware of the plan.   Total care time: 30 minutes.

## 2020-02-12 NOTE — Progress Notes (Signed)
MD notified of pt request for rejection medications related to his kidney transplant. Transport came for pt at request time

## 2020-02-12 NOTE — Progress Notes (Signed)
ANTICOAGULATION CONSULT NOTE - Initial Consult  Pharmacy Consult for Lovenox x 1 Indication: CP, elevated trop  Allergies  Allergen Reactions  . Other Rash    Use paper tape.   . Tape Rash    Use paper tape.   . Sulfur Rash    Patient Measurements: Height: 5\' 10"  (177.8 cm) Weight: 94.3 kg (208 lb) IBW/kg (Calculated) : 73  Vital Signs: Temp: 100.3 F (37.9 C) (01/04 0352) Temp Source: Oral (01/03 2318) BP: 142/82 (01/04 0352) Pulse Rate: 85 (01/04 0352)  Labs: Recent Labs    02/11/20 2330 02/12/20 0110  HGB 15.7  --   HCT 46.0  --   PLT 102*  --   CREATININE 1.52*  --   TROPONINIHS 372* 395*    Estimated Creatinine Clearance: 49.9 mL/min (A) (by C-G formula based on SCr of 1.52 mg/dL (H)).  Assessment: 75 y/o M with COVID-19. CP and elevated trop. Consulted to dose Lovenox x 1 while awaiting cardiology consult. Hgb 15.7. Plts low at 102. Noted renal dysfunction.   Goal of Therapy:  Monitor platelets by anticoagulation protocol: Yes   Plan:  Lovenox 100 mg subcutaneous x 1  F/U cardiology consult for further recommendations   Narda Bonds, PharmD, [place] Pharmacist Phone: [phone]

## 2020-02-12 NOTE — Consult Note (Addendum)
Cardiology Consultation:   Patient ID: Daniel Myers MRN: [id number]; DOB: 1945-07-24  Admit date: 02/11/2020 Date of Consult: 02/12/2020  Primary Care Provider: Claretta Fraise, MD [facility] Cardiologist: Rozann Lesches, MD (New) [facility] Electrophysiologist:  None     Patient Profile:   Daniel Myers is a 75 y.o. 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 Manuella Ghazi.  History of Present Illness:   Daniel Myers is a 75 yo male patient with history of HTN, HLD DM2, prior kidney transplant, no prior cardiac history admitted yesterday with acute respiratory failure with hypoxia secondary COVID-19 pneumonia.  Patient also had chest pain described as a heaviness and elephant sitting on his chest and orthopnea on admission according to notes.  BNP was 842 treated with IV Lasix. Troponins 372, 395, 4887, 8490, sodium 129, Crt 1.52. Per RN Tiffany patient comfortable and pain free.    Past Medical History:  Diagnosis Date  . Basal cell carcinoma 06/27/2013   nodular on left jawline - excision  . Basal cell carcinoma 07/01/2014   nodular on left hawling - CX3+5FU+excision  . Basal cell carcinoma 10/10/2014   nodular on right forearm, middle - tx p bx  . Basal cell carcinoma 02/11/2015   left neck - CX3 + excision  . Basal cell carcinoma 06/14/2016   left jawline - CX3+5FU  . Basal cell carcinoma 02/22/2017   superficial and nodular on left neck - excision  . Basal cell carcinoma 04/11/2018   superficial and nodular on right inferior forearm - CX3+Cautery+5FU  . Chronic kidney disease   . History of renal transplant   . Hyperlipidemia   . Hypertension   . 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 Squamous cell carcinoma of skin 11/08/2011   right upper forearm - tx p bx  . Squamous cell carcinoma of skin 11/08/2011   left upper forearm - tx p bx  . Squamous cell carcinoma of skin 11/08/2011   left hand - tx p bx  . 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/27/2013   in situ on left lower back - CX3+5FU  . 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/27/2013   in situ on left forehead - CX3+5FU  . 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/27/2013   in situ on right temple - watch per ST  . 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/27/2013   in situ on right forearm - CX3+5FU  . Squamous cell carcinoma of skin 07/01/2014   well differentiated on right sideburn - CX3+5FU  . Squamous cell carcinoma of skin 07/01/2014   in situ on left shoulder - CX3+5FU  . Squamous cell carcinoma of skin 07/01/2014   in situ on right forearm, proximal - CX3+5FU+Cautery  . Squamous cell carcinoma of skin 07/01/2014   in situ on right forearm, distal - CX3+5FU  . Squamous cell carcinoma of skin 09/30/2015   in situ on posterior left ear - CX3+5FU  . Squamous cell carcinoma of skin 02/22/2017   in situ on right upper arm - CX3+5FU  . Squamous cell carcinoma of skin  02/22/2017   in situ on left upper arm - CX3+5FU  . Squamous cell carcinoma of skin 04/11/2018   in situ on right temple - CX3+5FU  . Squamous cell carcinoma of skin 04/11/2018   in situ on lateral right arm - MOHs  . Squamous cell carcinoma of skin 04/11/2018   in situ on right upper arm - MOHs  . Squamous cell carcinoma of skin 04/11/2018   in situ on left sideburn  . Squamous cell carcinoma of skin 04/11/2018   in situ on right flank - tx p bx  . Squamous cell carcinoma of skin 09/28/2018   in situ on right inner ear - tx p bx  . Squamous cell carcinoma of skin 09/28/2018   in situ on left inner ear - tx p bx  . Squamous cell carcinoma of skin 09/28/2018   in situ on  right arm - tx p bx  . Squamous cell carcinoma of skin 03/21/2019   in situ on right antihelix ([place] treated topically)  . Squamous cell carcinoma of skin 03/21/2019   in situ on right neck - CX3+5FU  . Squamous cell carcinoma of skin 03/21/2019   in situ on left outer eye, inf (MOHs done 05/23/2019)  . Type 2 diabetes mellitus ([place])     Past Surgical History:  Procedure Laterality Date  . KIDNEY TRANSPLANT Right      Home Medications:  Prior to Admission medications   Medication Sig Start Date End Date Taking? Authorizing Provider  amLODipine (NORVASC) 10 MG tablet Take 1 tablet by mouth once daily 10/30/19   Claretta Fraise, MD  aspirin EC 81 MG tablet Take by mouth. 11/07/06   [provider]  blood glucose meter kit and supplies Dispense based on patient and insurance preference. Use up to four times daily as directed. (FOR ICD-10 E10.9, E11.9). 10/08/19   Claretta Fraise, MD  fexofenadine-pseudoephedrine (ALLEGRA-D 24) 180-240 MG 24 hr tablet Take 1 tablet by mouth every evening. For allergy and congestion 03/14/19   Claretta Fraise, MD  fluorouracil (EFUDEX) 5 % cream APPLY A SMALL AMOUNT OF CREAM TOPICALLY TWICE DAILY TO RIGHT EAR FOR 3 WEEKS 04/16/19   [provider]  fluticasone (FLONASE) 50 MCG/ACT nasal spray USE ONE SPRAY(S) IN EACH NOSTRIL ONCE DAILY 03/14/19   Claretta Fraise, MD  furosemide (LASIX) 40 MG tablet Take 40 mg by mouth daily. 09/30/18   [provider]  insulin NPH Human (NOVOLIN N) 100 UNIT/ML injection 30 units sq AC breakfast, 40 units sq ac supper 03/14/19   Claretta Fraise, MD  Insulin Regular Human 100 UNIT/ML SOPN Inject 12 units before lunch and 7 units before supper. 01/06/20   Claretta Fraise, MD  loratadine (CLARITIN) 10 MG tablet Take by mouth. 10/24/06   [provider]  mycophenolate (CELLCEPT) 250 MG capsule TAKE 2 CAPSULES BY MOUTH TWO TIMES DAILY. 06/10/14   [provider]  omega-3 acid ethyl esters (LOVAZA) 1 g  capsule Take 2 capsules (2 g total) by mouth 2 (two) times daily. 03/14/19   Claretta Fraise, MD  pravastatin (PRAVACHOL) 80 MG tablet TAKE 1 TABLET BY MOUTH ONCE DAILY IN THE MORNING 03/14/19   Claretta Fraise, MD  predniSONE (DELTASONE) 5 MG tablet Take 5 mg by mouth daily. 10/23/18   [provider]  [place] X 15/64" 0.5 ML MISC USE AS DIRECTED FOR INSULIN 07/04/18   [provider]  rosuvastatin (CRESTOR) 10 MG tablet Take 1 tablet (10 mg  total) by mouth daily. For cholesterol Patient not taking: Reported on 01/02/2020 10/03/19   Claretta Fraise, MD  sodium bicarbonate 650 MG tablet Take 650 mg by mouth 2 (two) times daily. 09/26/18   [provider]  tacrolimus (PROGRAF) 0.5 MG capsule TAKE 2 CAPSULES BY MOUTH TWO TIMES DAILY. 07/08/15   [provider]    Inpatient Medications: Scheduled Meds: . aerochamber Z-Stat Plus/medium  1 each Other Once  . albuterol  2 puff Inhalation Q6H  . amLODipine  10 mg Oral Daily  . vitamin C  500 mg Oral Daily  . insulin aspart  0-5 Units Subcutaneous QHS  . insulin aspart  0-9 Units Subcutaneous TID WC  . insulin detemir  0.075 Units/kg Subcutaneous BID  . linagliptin  5 mg Oral Daily  . methylPREDNISolone (SOLU-MEDROL) injection  40 mg Intravenous Q12H   Followed by  . [START ON 02/15/2020] predniSONE  50 mg Oral Daily  . metoprolol tartrate  25 mg Oral BID  . pantoprazole  40 mg Oral Daily  . pravastatin  80 mg Oral Daily  . zinc sulfate  220 mg Oral Daily   Continuous Infusions: . [START ON 02/13/2020] remdesivir 100 mg in NS 100 mL     PRN Meds: acetaminophen, chlorpheniramine-HYDROcodone, guaiFENesin-dextromethorphan, ondansetron **OR** ondansetron (ZOFRAN) IV  Allergies:    Allergies  Allergen Reactions  . Other Rash    Use paper tape.   . Tape Rash    Use paper tape.   . Sulfur Rash    Social History:   Social History   Tobacco Use  . Smoking status: Current Some Day Smoker    Types:  Pipe  . Smokeless tobacco: Never Used  Vaping Use  . Vaping Use: Never used  Substance Use Topics  . Alcohol use: Never  . Drug use: Never    Family History:     Family History  Problem Relation Age of Onset  . Clotting disorder Mother   . Hypertension Sister   . Diabetes Sister      ROS:  Please see the history of present illness.  Patient's chart reviewed remotely and with RN Tiffany and Dr. Manuella Ghazi All other ROS reviewed and negative.     Physical Exam/Data:   Vitals:   02/12/20 0352 02/12/20 0415 02/12/20 0600 02/12/20 0700  BP: (!) 142/82 140/67 (!) 128/56   Pulse: 85 88 77   Resp: (!) _0 Temp: 100.3 F (37.9 C) 100.3 F (37.9 C) 98 F (36.7 C)   TempSrc:  Oral Oral   SpO2: 93% 98% 98% 97%  Weight:  96.3 kg    Height:  _1  (1.778 m)      Intake/Output Summary (Last 24 hours) at 02/12/2020 0854 Last data filed at 02/12/2020 0531 Gross per 24 hour  Intake 100 ml  Output -  Net 100 ml   Last 3 Weights 02/12/2020 02/11/2020 01/02/2020  Weight (lbs) 212 lb 4.9 oz 208 lb 210 lb 6 oz  Weight (kg) 96.3 kg 94.348 kg 95.425 kg     Body mass index is 30.46 kg/m.   Patient not examined. RN Tiffany says diminished breath sounds, no rales, edema. Patient with no current complaints-feels fine  EKG:  The EKG was personally reviewed and demonstrates:  NSR with inf Q waves, poor ant R waves, inflat ST changes. EKG readout says no change since 2007, but that EKG isn't available to review Telemetry:  Telemetry was personally reviewed and demonstrates:  NSR with freq PVC's, bigeminy, atrial tachycardia 120/m at times  Laboratory Data:  High Sensitivity Troponin:   Recent Labs  Lab 02/11/20 2330 02/12/20 0110 02/12/20 0520 02/12/20 0742  TROPONINIHS 372* 395* 4,887* 8,490*     Chemistry Recent Labs  Lab 02/11/20 2330  NA 129*  K 3.9  CL 98  CO2 21*  GLUCOSE 198*  BUN 20  CREATININE 1.52*  CALCIUM 8.6*  GFRNONAA 48*  ANIONGAP 10    Recent Labs  Lab  02/11/20 2330  PROT 6.6  ALBUMIN 3.6  AST 34  ALT 23  ALKPHOS 51  BILITOT 0.8   Hematology Recent Labs  Lab 02/11/20 2330  WBC 4.8  RBC 5.32  HGB 15.7  HCT 46.0  MCV 86.5  MCH 29.5  MCHC 34.1  RDW 12.5  PLT 102*   BNP Recent Labs  Lab 02/11/20 2330  BNP 842.0*    DDimer  Recent Labs  Lab 02/11/20 2330  DDIMER 0.68*     Radiology/Studies:  DG Chest Port 1 View  Result Date: 02/11/2020 CLINICAL DATA:  Chest heaviness and shortness of breath. EXAM: PORTABLE CHEST 1 VIEW COMPARISON:  July 30, 2005 FINDINGS: Mild, diffuse, chronic appearing increased interstitial lung markings are seen. There is no evidence of acute infiltrate, pleural effusion or pneumothorax. The cardiac silhouette is borderline in size and stable in appearance. The visualized skeletal structures are unremarkable. IMPRESSION: Chronic appearing increased lung markings without evidence of acute or active cardiopulmonary disease. Electronically Signed   By: Virgina Norfolk M.D.   On: 02/11/2020 23:42     Assessment and Plan:   NSTEMI with abnormal EKG, troponins 4887-stat repeat troponin now 8490 in the setting of Covid 19 infection. Per RN and Dr. Manuella Ghazi patient currently pain free and stable. Received one dose of Lovenox. Will start IV heparin, check follow-up EKG and echo. Recommend transfer to [facility] for closer monitoring and cardiology will follow there to determine if cath indicated. With PVC's, bigeminy and atrial tachycardia will add low dose beta blocker.  Acute respiratory failure in the setting of covid 19 infection on O2  CHF-orthopnea on admission with BNP 842 treated with IV lasix-needs echo-takes 40 mg daily at home. Na 129 yest  HTN BP stable this am on Norvasc 10 mg daily.   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  New York Heart Association (NYHA) Functional Class NYHA Class IV        For questions or updates, please contact [facility] Please consult [email or web address] for contact info under    Signed, Ermalinda Barrios, PA-C 02/12/2020 8:54 AM     Attending note:  Hospital course reviewed and case discussed with Ms. Vita Barley, I agree with her above findings.  Mr. Bumgarner presents with shortness of breath and acute hypoxic respiratory failure in the setting of suspected developing COVID-19 pneumonia.  His baseline history includes CKD with previous renal transplantation on immunosuppressive therapy, also hypertension, type 2 diabetes mellitus, and hyperlipidemia.  He did have chest pressure at presentation which has subsequently resolved.  High-sensitivity troponin I levels have increased consistent with NSTEMI.  ECG shows no acute ST  segment changes, however potential old inferior and anteroseptal infarct pattern.  There are no old tracings for review.  No prior cardiac history.  Recent temperature 100.3 degrees, heart rate in the 70s in sinus rhythm by telemetry which I personally reviewed, systolic blood pressure 700F to 150s.  Chest x-ray reports chronic appearing interstitial lung markings.  Pertinent lab work includes sodium 129, potassium 3.9, BUN 20, creatinine 1.52, AST 34, ALT 23, BNP 842, high-sensitivity troponin I up to 8490, lactic acid 2.2 down to 1.5, procalcitonin less than 0.10, WBC 4.8, hemoglobin 15.7, platelets 102, D-dimer 0.68, SARS coronavirus 2 test positive, blood cultures pending.  NSTEMI in the setting of COVID-19 infection (unvaccinated) and possible developing pneumonia with hypoxic respiratory failure.  Could certainly be myocarditis however he does have risk factors for typical ischemic heart disease and therefore ACS with  increased thrombotic state also possible.  Also has been on immunosuppressive therapy with previous renal transplantation.  Pain-free at this point, ECG without acute ST segment changes with repeat tracing pending.  Echocardiogram has been ordered.  Agree with transition to IV heparin, adding low-dose beta-blocker and aspirin, continue Norvasc and Pravachol.  In light of patient complexity would recommend transfer to [facility], if he were to deteriorate further would benefit from around-the-clock access to critical care and also potentially the advanced heart failure service if in fact he does have a significant cardiomyopathy.  Not clear that urgent cardiac catheterization is required at this point however.  Our service will continue to follow with you.  Satira Sark, M.D., F.A.C.C.

## 2020-02-12 NOTE — Progress Notes (Signed)
CRITICAL VALUE ALERT  Critical Value:  8,490  Date & Time Notied:  02/12/20, 8127  Provider Notified: Manuella Ghazi  Orders Received/Actions taken: yes

## 2020-02-12 NOTE — H&P (Addendum)
History and Physical  Daniel Myers Myers DOB: 1945-06-23 DOA: 02/11/2020  Referring physician: Rolland Porter, MD  PCP: Claretta Fraise, MD  Patient coming from: Home  Chief Complaint: Shortness of breath  HPI: Daniel Myers is a 75 y.o. male with medical history significant for hypertension, hyperlipidemia, type 2 diabetes mellitus, obesity history of right kidney transplant and prior skin cancer in remission who presents to the emergency department due to 2 day onset of shortness of breath.  He complained of shortness of breath mostly when he lays down in bed, this is associated with a sensation of heaviness on the left side of his chest described as " elephant sitting on my chest", chest pain was nonradiating, nonreproducible and quickly resolves once he sits up.  Shortness of breath does not get worse with ambulation. He denies cough, but endorsed subjective fever yesterday.  He denies leg swelling and vomiting, but endorsed nausea.  Patient denies any sick contacts.  He has not had any Covid vaccine.  ED Course:  In the emergency department, he was tachypneic and tachycardic.  O2 sat was 80% on room air, supplemental oxygen via [place] at 5 LPM was provided with O2 sat at 88%, this was increased to 8 LPM via HFNC with O2 sats at 96 to 100%.  Work-up in the ED showed BNP 842, troponin I 372 >395, lactic acid 2.2, D-dimer 0.68, SARS coronavirus 2 virus was positive.  Procalcitonin < 0.10.  BUN/creatinine was 20/1.52 (baseline creatinine 1.4-1.5) Chest x-ray showed chronic appearing increased lung markings without evidence of acute or active cardiopulmonary disease Breathing treatment was provided, IV Decadron 10 Mg x1 was given and patient was treated with IV Lasix 60 Mg x1.  Chest pain has since resolved.  Hospitalist was asked to admit patient for further evaluation and management.   Review of Systems: Constitutional: Positive for subjective fever.  Negative for chills  HENT: Negative for ear  pain and sore throat.   Eyes: Negative for pain and visual disturbance.  Respiratory: Positive for shortness of breath when he lays down.  Negative for cough, chest tightness Cardiovascular: Positive for chest pain  when he lays down.  Negative for palpitations.  Gastrointestinal: Negative for abdominal pain and vomiting.  Endocrine: Negative for polyphagia and polyuria.  Genitourinary: Negative for decreased urine volume, dysuria, enuresis Musculoskeletal: Negative for arthralgias and back pain.  Skin: Negative for color change and rash.  Allergic/Immunologic: Negative for immunocompromised state.  Neurological: Negative for tremors, syncope, speech difficulty, weakness, light-headedness and headaches.  Hematological: Does not bruise/bleed easily.  All other systems reviewed and are negative  Past Medical History:  Diagnosis Date  . Basal cell carcinoma 06/27/2013   nodular on left jawline - excision  . Basal cell carcinoma 07/01/2014   nodular on left hawling - CX3+5FU+excision  . Basal cell carcinoma 10/10/2014   nodular on right forearm, middle - tx p bx  . Basal cell carcinoma 02/11/2015   left neck - CX3 + excision  . Basal cell carcinoma 06/14/2016   left jawline - CX3+5FU  . Basal cell carcinoma 02/22/2017   superficial and nodular on left neck - excision  . Basal cell carcinoma 04/11/2018   superficial and nodular on right inferior forearm - CX3+Cautery+5FU  . Chronic kidney disease   . Diabetes mellitus without complication ([place])   . Hyperlipidemia   . Hypertension   . 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 Squamous  cell carcinoma of skin 08/05/2010   hypertrophic on left ear - CX3+5FU  . 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 Squamous cell carcinoma of skin 11/08/2011   right upper forearm - tx p bx  . Squamous cell carcinoma of skin 11/08/2011   left upper forearm - tx p bx  .  Squamous cell carcinoma of skin 11/08/2011   left hand - tx p bx  . 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/27/2013   in situ on left lower back - CX3+5FU  . 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/27/2013   in situ on left forehead - CX3+5FU  . 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/27/2013   in situ on right temple - watch per ST  . 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/27/2013   in situ on right forearm - CX3+5FU  . Squamous cell carcinoma of skin 07/01/2014   well differentiated on right sideburn - CX3+5FU  . Squamous cell carcinoma of skin 07/01/2014   in situ on left shoulder - CX3+5FU  . Squamous cell carcinoma of skin 07/01/2014   in situ on right forearm, proximal - CX3+5FU+Cautery  . Squamous cell carcinoma of skin 07/01/2014   in situ on right forearm, distal - CX3+5FU  . Squamous cell carcinoma of skin 09/30/2015   in situ on posterior left ear - CX3+5FU  . Squamous cell carcinoma of skin 02/22/2017   in situ on right upper arm - CX3+5FU  . Squamous cell carcinoma of skin 02/22/2017   in situ on left upper arm - CX3+5FU  . Squamous cell carcinoma of skin 04/11/2018   in situ on right temple - CX3+5FU  . Squamous cell carcinoma of skin 04/11/2018   in situ on lateral right arm - MOHs  . Squamous cell carcinoma of skin 04/11/2018   in situ on right upper arm - MOHs  . Squamous cell carcinoma of skin 04/11/2018   in situ on left sideburn  . Squamous cell carcinoma of skin 04/11/2018   in situ on right flank - tx p bx  . Squamous cell carcinoma of skin 09/28/2018   in situ on right inner ear - tx p bx  . Squamous cell carcinoma of skin 09/28/2018   in situ on left inner ear - tx p bx  . Squamous cell carcinoma of skin 09/28/2018   in situ on right arm - tx p bx  . Squamous cell carcinoma of skin 03/21/2019   in situ on right antihelix (Mitkov treated topically)  . Squamous cell carcinoma of skin 03/21/2019   in situ on right neck - CX3+5FU  . Squamous cell carcinoma of skin  03/21/2019   in situ on left outer eye, inf (MOHs done 05/23/2019)   Past Surgical History:  Procedure Laterality Date  . KIDNEY TRANSPLANT Right     Social History:  reports that he has been smoking pipe. He has never used smokeless tobacco. He reports that he does not drink alcohol and does not use drugs.   Allergies  Allergen Reactions  . Other Rash    Use paper tape.   . Tape Rash    Use paper tape.   . Sulfur Rash    Family History  Problem Relation Age of Onset  . Clotting disorder Mother   . Hypertension Sister   . Diabetes Sister      Prior to Admission medications   Medication Sig Start Date End Date  Taking? Authorizing Provider  amLODipine (NORVASC) 10 MG tablet Take 1 tablet by mouth once daily 10/30/19   Claretta Fraise, MD  aspirin EC 81 MG tablet Take by mouth. 11/07/06   [provider]  blood glucose meter kit and supplies Dispense based on patient and insurance preference. Use up to four times daily as directed. (FOR ICD-10 E10.9, E11.9). 10/08/19   Claretta Fraise, MD  fexofenadine-pseudoephedrine (ALLEGRA-D 24) 180-240 MG 24 hr tablet Take 1 tablet by mouth every evening. For allergy and congestion 03/14/19   Claretta Fraise, MD  fluorouracil (EFUDEX) 5 % cream APPLY A SMALL AMOUNT OF CREAM TOPICALLY TWICE DAILY TO RIGHT EAR FOR 3 WEEKS 04/16/19   [provider]  fluticasone (FLONASE) 50 MCG/ACT nasal spray USE ONE SPRAY(S) IN EACH NOSTRIL ONCE DAILY 03/14/19   Claretta Fraise, MD  furosemide (LASIX) 40 MG tablet Take 40 mg by mouth daily. 09/30/18   [provider]  insulin NPH Human (NOVOLIN N) 100 UNIT/ML injection 30 units sq AC breakfast, 40 units sq ac supper 03/14/19   Claretta Fraise, MD  Insulin Regular Human 100 UNIT/ML SOPN Inject 12 units before lunch and 7 units before supper. 01/06/20   Claretta Fraise, MD  loratadine (CLARITIN) 10 MG tablet Take by mouth. 10/24/06   [provider]  mycophenolate (CELLCEPT) 250 MG capsule TAKE  2 CAPSULES BY MOUTH TWO TIMES DAILY. 06/10/14   [provider]  omega-3 acid ethyl esters (LOVAZA) 1 g capsule Take 2 capsules (2 g total) by mouth 2 (two) times daily. 03/14/19   Claretta Fraise, MD  pravastatin (PRAVACHOL) 80 MG tablet TAKE 1 TABLET BY MOUTH ONCE DAILY IN THE MORNING 03/14/19   Claretta Fraise, MD  predniSONE (DELTASONE) 5 MG tablet Take 5 mg by mouth daily. 10/23/18   [provider]  [place] X 15/64" 0.5 ML MISC USE AS DIRECTED FOR INSULIN 07/04/18   [provider]  rosuvastatin (CRESTOR) 10 MG tablet Take 1 tablet (10 mg total) by mouth daily. For cholesterol Patient not taking: Reported on 01/02/2020 10/03/19   Claretta Fraise, MD  sodium bicarbonate 650 MG tablet Take 650 mg by mouth 2 (two) times daily. 09/26/18   [provider]  tacrolimus (PROGRAF) 0.5 MG capsule TAKE 2 CAPSULES BY MOUTH TWO TIMES DAILY. 07/08/15   [provider]    Physical Exam: BP (!) 142/82   Pulse 85   Temp 100.3 F (37.9 C)   Resp (!) 23   Ht _0  (1.778 m)   Wt 94.3 kg   SpO2 93%   BMI 29.84 kg/m   . General: 75 y.o. year-old male obese in no acute distress.  Alert and oriented x3. Marland Kitchen HEENT: NCAT, EOMI . Neck: Supple, trachea medial . Cardiovascular: Tachycardia.  Regular rate and rhythm with no rubs or gallops.  No thyromegaly or JVD noted.  2/4 pulses in all 4 extremities. Marland Kitchen Respiratory: Tachypnea.  Clear to auscultation with no wheezes or rales. Good inspiratory effort. . Abdomen: Soft nontender nondistended with normal bowel sounds x4 quadrants. . Muskuloskeletal: Bilateral trace edema to 1/3 shin.  No cyanosis or clubbing  . Neuro: CN II-XII intact, strength 5/5 x 4, sensation and reflexes intact . Skin: No ulcerative lesions noted or rashes . Psychiatry: Judgement and insight appear normal. Mood is appropriate for condition and setting          Labs on Admission:  Basic Metabolic Panel: Recent Labs  Lab 02/11/20 2330  NA 129*  K 3.9  CL 98  CO2 21*  GLUCOSE 198*  BUN 20  CREATININE 1.52*  CALCIUM 8.6*   Liver Function Tests: Recent Labs  Lab 02/11/20 2330  AST 34  ALT 23  ALKPHOS 51  BILITOT 0.8  PROT 6.6  ALBUMIN 3.6   No results for input(s): LIPASE, AMYLASE in the last 168 hours. No results for input(s): AMMONIA in the last 168 hours. CBC: Recent Labs  Lab 02/11/20 2330  WBC 4.8  HGB 15.7  HCT 46.0  MCV 86.5  PLT 102*   Cardiac Enzymes: No results for input(s): CKTOTAL, CKMB, CKMBINDEX, TROPONINI in the last 168 hours.  BNP (last 3 results) Recent Labs    02/11/20 2330  BNP 842.0*    ProBNP (last 3 results) No results for input(s): PROBNP in the last 8760 hours.  CBG: No results for input(s): GLUCAP in the last 168 hours.  Radiological Exams on Admission: DG Chest Port 1 View  Result Date: 02/11/2020 CLINICAL DATA:  Chest heaviness and shortness of breath. EXAM: PORTABLE CHEST 1 VIEW COMPARISON:  July 30, 2005 FINDINGS: Mild, diffuse, chronic appearing increased interstitial lung markings are seen. There is no evidence of acute infiltrate, pleural effusion or pneumothorax. The cardiac silhouette is borderline in size and stable in appearance. The visualized skeletal structures are unremarkable. IMPRESSION: Chronic appearing increased lung markings without evidence of acute or active cardiopulmonary disease. Electronically Signed   By: Virgina Norfolk M.D.   On: 02/11/2020 23:42    EKG: I independently viewed the EKG done and my findings are as followed: Normal sinus rhythm at a rate of 93 bpm  Assessment/Plan Present on Admission: . COVID-19 virus infection . Hyponatremia . Essential hypertension . Mixed hyperlipidemia  Active Problems:   Essential hypertension   Mixed hyperlipidemia   Hyponatremia   COVID-19 virus infection   Lactic acidosis   Elevated troponin   Elevated brain natriuretic peptide (BNP) level   Hyperglycemia due to diabetes mellitus  (HCC)   Acute respiratory failure with hypoxia (HCC)   CKD (chronic kidney disease), stage III (HCC)   Atypical chest pain  Acute respiratory failure with hypoxia possibly secondary to COVID-19 virus infection SARS coronavirus 2 was positive Continue albuterol q.6h Continue IV per pharmacy dosing Continue IV Remdesivir per pharmacy protocol Continue vitamin-C 500 mg p.o. Daily Continue zinc 220 mg p.o. Daily Continue Mucinex, Robitussin and Tussionex Continue Tylenol p.r.n. for fever Continue supplemental oxygen to maintain O2 sat > or = 94% with plan to wean patient off supplemental oxygen as tolerated (of note, patient does not use oxygen at baseline) Continue incentive spirometry and flutter valve q25mn as tolerated Encourage proning, early ambulation, and side laying as tolerated Continue airborne isolation precaution Inflammatory markers: LDH: 207 CRP: 2.4 D-dimer: 0.68 Ferritin: 117 Continue monitoring daily inflammatory markers Physician PPE:  Surgical mask with face shield, N-95, nonsterile gloves, disposable gown, head and shoe cover s Patient PPE:  Face mask   Elevated BNP R/O acute CHF in the setting of above BNP 842, patient complained of orthopnea Continue total input/output, daily weights and fluid restriction  IV Lasix 60 Mg x1 was given Due to hyponatremia, echocardiogram will be checked to confirm CHF to determine Lasix dosing Continue Cardiac diet  EKG personally reviewed showed normal sinus rhythm at a rate of 93 bpm Echocardiogram in the morning   Atypical chest pain rule out ACS Elevated troponin possibly due to type II demand ischemia Chest pain was only when  patient is supine and this has since resolved Troponin I 372 > 395; therapeutic dose of Lovenox x1 will be given at this time pending cardiology evaluation Continue to trend troponin Cardiology will be consulted and we shall await their recommendation.  Lactic acidosis possibly due to hypoxia in  the setting of respiratory failure secondary to COVID-19 virus, Lactic acid 2.2, continue to trend lactic acid  Hyperglycemia secondary to type 2 diabetes mellitus Continue basal insulin, ISS and hypoglycemic protocol  Hyperglycemia induced hyponatremia Na 129, corrected sodium level at 131 Continue to monitor sodium level with morning labs  Essential hypertension Continue amlodipine  Mixed hyperlipidemia Continue pravastatin  CKD stage IIIA BUN/creatinine was 20/1.52 (baseline creatinine 1.4-1.5) Renally adjust medications, avoid nephrotoxic agents/dehydration/hypotension  Obesity (BMI 30.46) Patient will be counseled on diet and lifestyle modification when more stable  DVT prophylaxis: Lovenox  Code Status: Full code  Family Communication: None at bedside  Disposition Plan:  Patient is from:                        home Anticipated DC to:                   SNF or family members home Anticipated DC date:               2-3 days Anticipated DC barriers:           Patient is unstable to be discharged at this time due to hypoxia secondary to COVID-19 virus infection and further work-up to rule out ACS and CHF  Consults called: Cardiology  Admission status: Inpatient  Bernadette Hoit MD [facility] Pager [phone]  If 7PM-7AM, please contact night-coverage [email or web address] Password [facility]  02/12/2020, 3:53 AM

## 2020-02-12 NOTE — Progress Notes (Signed)
CRITICAL VALUE ALERT  Critical Value:  8393  Date & Time Notied:  02/12/20 1000  Provider Notified: Manuella Ghazi  Orders Received/Actions taken: orders in place

## 2020-02-12 NOTE — ED Notes (Signed)
Pt calling out, stating he is feeling more SOB, noted saturations in the low 80's. Placed pt on 2 liters and encouraged deep breathing. Oxygen increased to 5 liters, saturations 88%. Pt is coughing during assessment, but states that he has not been coughing. He denies coughing prior, covid contacts, not vaccinated. Dr. Tomi Bamberger notified

## 2020-02-12 NOTE — Progress Notes (Signed)
CRITICAL VALUE ALERT  Critical Value:  Trop 1194  Date & Time Notied:  02/12/20 @1205pm   Provider Notified: Manuella Ghazi  Orders Received/Actions taken: orders in place

## 2020-02-12 NOTE — Progress Notes (Signed)
CRITICAL VALUE ALERT  Critical Value:  CBG 423  Date & Time Notied:  02/12/20 0438  Provider Notified: Manuella Ghazi  Orders Received/Actions taken: 20units

## 2020-02-12 NOTE — Progress Notes (Signed)
ANTICOAGULATION CONSULT NOTE - Initial Consult  Pharmacy Consult for heparin Indication: chest pain/ACS/NSTEMI  Allergies  Allergen Reactions  . Other Rash    Use paper tape.   . Tape Rash    Use paper tape.   . Sulfur Rash    Patient Measurements: Height: '5\' 10"'  (177.8 cm) Weight: 96.3 kg (212 lb 4.9 oz) IBW/kg (Calculated) : 73 Heparin Dosing Weight: 93 kg  Vital Signs: Temp: 98 F (36.7 C) (01/04 0600) Temp Source: Oral (01/04 0600) BP: 128/56 (01/04 0600) Pulse Rate: 77 (01/04 0600)  Labs: Recent Labs    02/11/20 2330 02/12/20 0110 02/12/20 0520 02/12/20 0742  HGB 15.7  --   --   --   HCT 46.0  --   --   --   PLT 102*  --   --   --   CREATININE 1.52*  --   --   --   TROPONINIHS 372* 395* 4,887* 8,490*    Estimated Creatinine Clearance: 50.4 mL/min (A) (by C-G formula based on SCr of 1.52 mg/dL (H)).   Medical History: Past Medical History:  Diagnosis Date  . Basal cell carcinoma 06/27/2013   nodular on left jawline - excision  . Basal cell carcinoma 07/01/2014   nodular on left hawling - CX3+5FU+excision  . Basal cell carcinoma 10/10/2014   nodular on right forearm, middle - tx p bx  . Basal cell carcinoma 02/11/2015   left neck - CX3 + excision  . Basal cell carcinoma 06/14/2016   left jawline - CX3+5FU  . Basal cell carcinoma 02/22/2017   superficial and nodular on left neck - excision  . Basal cell carcinoma 04/11/2018   superficial and nodular on right inferior forearm - CX3+Cautery+5FU  . Chronic kidney disease   . History of renal transplant   . Hyperlipidemia   . Hypertension   . 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 Squamous cell carcinoma of skin 11/08/2011   right upper forearm - tx p bx  . Squamous cell carcinoma of skin  11/08/2011   left upper forearm - tx p bx  . Squamous cell carcinoma of skin 11/08/2011   left hand - tx p bx  . 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/27/2013   in situ on left lower back - CX3+5FU  . 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/27/2013   in situ on left forehead - CX3+5FU  . 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/27/2013   in situ on right temple - watch per ST  . 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/27/2013   in situ on right forearm - CX3+5FU  . Squamous cell carcinoma of skin 07/01/2014   well differentiated on right sideburn - CX3+5FU  . Squamous cell carcinoma of skin 07/01/2014   in situ on left shoulder - CX3+5FU  . Squamous cell carcinoma of skin 07/01/2014   in situ on right forearm, proximal - CX3+5FU+Cautery  . Squamous cell carcinoma of skin 07/01/2014   in situ on right forearm, distal - CX3+5FU  . Squamous cell carcinoma of skin 09/30/2015   in situ on posterior left ear - CX3+5FU  . Squamous cell carcinoma of skin 02/22/2017   in situ on right upper arm - CX3+5FU  . Squamous cell  carcinoma of skin 02/22/2017   in situ on left upper arm - CX3+5FU  . Squamous cell carcinoma of skin 04/11/2018   in situ on right temple - CX3+5FU  . Squamous cell carcinoma of skin 04/11/2018   in situ on lateral right arm - MOHs  . Squamous cell carcinoma of skin 04/11/2018   in situ on right upper arm - MOHs  . Squamous cell carcinoma of skin 04/11/2018   in situ on left sideburn  . Squamous cell carcinoma of skin 04/11/2018   in situ on right flank - tx p bx  . Squamous cell carcinoma of skin 09/28/2018   in situ on right inner ear - tx p bx  . Squamous cell carcinoma of skin 09/28/2018   in situ on left inner ear - tx p bx  . Squamous cell carcinoma of skin 09/28/2018   in situ on right arm - tx p bx  . Squamous cell carcinoma of skin 03/21/2019   in situ on right antihelix (Mitkov treated topically)  . Squamous cell carcinoma of skin 03/21/2019   in situ on right neck -  CX3+5FU  . Squamous cell carcinoma of skin 03/21/2019   in situ on left outer eye, inf (MOHs done 05/23/2019)  . Type 2 diabetes mellitus (HCC)     Medications:  Medications Prior to Admission  Medication Sig Dispense Refill Last Dose  . amLODipine (NORVASC) 10 MG tablet Take 1 tablet by mouth once daily 90 tablet 1 02/11/2020 at Unknown time  . aspirin EC 81 MG tablet Take by mouth.   02/11/2020 at Unknown time  . blood glucose meter kit and supplies Dispense based on patient and insurance preference. Use up to four times daily as directed. (FOR ICD-10 E10.9, E11.9). 1 each 0   . fexofenadine-pseudoephedrine (ALLEGRA-D 24) 180-240 MG 24 hr tablet Take 1 tablet by mouth every evening. For allergy and congestion 30 tablet 11   . fluorouracil (EFUDEX) 5 % cream APPLY A SMALL AMOUNT OF CREAM TOPICALLY TWICE DAILY TO RIGHT EAR FOR 3 WEEKS     . fluticasone (FLONASE) 50 MCG/ACT nasal spray USE ONE SPRAY(S) IN EACH NOSTRIL ONCE DAILY 16 g 11 02/11/2020 at Unknown time  . furosemide (LASIX) 40 MG tablet Take 40 mg by mouth daily.   02/11/2020 at Unknown time  . insulin NPH Human (NOVOLIN N) 100 UNIT/ML injection 30 units sq AC breakfast, 40 units sq ac supper 30 mL 11 02/11/2020 at Unknown time  . Insulin Regular Human 100 UNIT/ML SOPN Inject 12 units before lunch and 7 units before supper. 10 mL prn 02/11/2020 at Unknown time  . loratadine (CLARITIN) 10 MG tablet Take by mouth.   02/11/2020 at Unknown time  . mycophenolate (CELLCEPT) 250 MG capsule TAKE 2 CAPSULES BY MOUTH TWO TIMES DAILY.   02/11/2020 at Unknown time  . omega-3 acid ethyl esters (LOVAZA) 1 g capsule Take 2 capsules (2 g total) by mouth 2 (two) times daily. 360 capsule 3 02/11/2020 at Unknown time  . pravastatin (PRAVACHOL) 80 MG tablet TAKE 1 TABLET BY MOUTH ONCE DAILY IN THE MORNING 90 tablet 3 02/11/2020 at Unknown time  . RELION INSULIN SYRINGE 31G X 15/64" 0.5 ML MISC USE AS DIRECTED FOR INSULIN     . sodium bicarbonate 650 MG tablet Take 650 mg  by mouth 2 (two) times daily.   02/11/2020 at Unknown time  . sulfamethoxazole-trimethoprim (BACTRIM) 400-80 MG tablet Take 1 tablet by mouth 3 (three) times a  week.   02/11/2020 at Unknown time  . tacrolimus (PROGRAF) 0.5 MG capsule TAKE 2 CAPSULES BY MOUTH TWO TIMES DAILY.   02/11/2020 at Unknown time  . predniSONE (DELTASONE) 5 MG tablet Take 5 mg by mouth daily. (Patient not taking: Reported on 02/12/2020)   Not Taking at Unknown time  . rosuvastatin (CRESTOR) 10 MG tablet Take 1 tablet (10 mg total) by mouth daily. For cholesterol (Patient not taking: No sig reported) 90 tablet 1 Not Taking at Unknown time    Assessment: Pharmacy consulted to dose heparin in patient with chest pain/NSTEMI.  Patient is not on anticoagulation prior to admission.  Therapeutic lovenox dose given 1/4 0453.  Goal of Therapy:  Heparin level 0.3-0.7 units/ml Monitor platelets by anticoagulation protocol: Yes   Plan:  Start heparin at 1700 due to previous therapeutic Lovenox dose. Give 4000  units bolus x 1 Start heparin infusion at 1150 units/hr Check anti-Xa level in 6-8 hours and daily while on heparin Continue to monitor H&H and platelets  Margot Ables, PharmD Clinical Pharmacist 02/12/2020 10:27 AM

## 2020-02-12 NOTE — Progress Notes (Signed)
CRITICAL VALUE ALERT  Critical Value:  Blood sugar 407  Date & Time Notied:  02/12/20 1115  Provider Notified: Manuella Ghazi  Orders Received/Actions taken: orders given

## 2020-02-13 DIAGNOSIS — I214 Non-ST elevation (NSTEMI) myocardial infarction: Secondary | ICD-10-CM

## 2020-02-13 DIAGNOSIS — N179 Acute kidney failure, unspecified: Secondary | ICD-10-CM | POA: Diagnosis not present

## 2020-02-13 DIAGNOSIS — D696 Thrombocytopenia, unspecified: Secondary | ICD-10-CM

## 2020-02-13 DIAGNOSIS — U071 COVID-19: Secondary | ICD-10-CM | POA: Diagnosis not present

## 2020-02-13 DIAGNOSIS — I5041 Acute combined systolic (congestive) and diastolic (congestive) heart failure: Secondary | ICD-10-CM

## 2020-02-13 DIAGNOSIS — J9601 Acute respiratory failure with hypoxia: Secondary | ICD-10-CM | POA: Diagnosis not present

## 2020-02-13 DIAGNOSIS — J1282 Pneumonia due to coronavirus disease 2019: Secondary | ICD-10-CM | POA: Diagnosis not present

## 2020-02-13 DIAGNOSIS — R778 Other specified abnormalities of plasma proteins: Secondary | ICD-10-CM

## 2020-02-13 DIAGNOSIS — I10 Essential (primary) hypertension: Secondary | ICD-10-CM

## 2020-02-13 DIAGNOSIS — I5043 Acute on chronic combined systolic (congestive) and diastolic (congestive) heart failure: Secondary | ICD-10-CM

## 2020-02-13 HISTORY — DX: Acute kidney failure, unspecified: N17.9

## 2020-02-13 HISTORY — DX: Non-ST elevation (NSTEMI) myocardial infarction: I21.4

## 2020-02-13 LAB — CBC WITH DIFFERENTIAL/PLATELET
Abs Immature Granulocytes: 0.03 10*3/uL (ref 0.00–0.07)
Basophils Absolute: 0 10*3/uL (ref 0.0–0.1)
Basophils Relative: 0 %
Eosinophils Absolute: 0 10*3/uL (ref 0.0–0.5)
Eosinophils Relative: 0 %
HCT: 39.8 % (ref 39.0–52.0)
Hemoglobin: 13.6 g/dL (ref 13.0–17.0)
Immature Granulocytes: 1 %
Lymphocytes Relative: 9 %
Lymphs Abs: 0.5 10*3/uL — ABNORMAL LOW (ref 0.7–4.0)
MCH: 28.9 pg (ref 26.0–34.0)
MCHC: 34.2 g/dL (ref 30.0–36.0)
MCV: 84.5 fL (ref 80.0–100.0)
Monocytes Absolute: 0.2 10*3/uL (ref 0.1–1.0)
Monocytes Relative: 4 %
Neutro Abs: 4.8 10*3/uL (ref 1.7–7.7)
Neutrophils Relative %: 86 %
Platelets: 85 10*3/uL — ABNORMAL LOW (ref 150–400)
RBC: 4.71 MIL/uL (ref 4.22–5.81)
RDW: 12.3 % (ref 11.5–15.5)
WBC: 5.6 10*3/uL (ref 4.0–10.5)
nRBC: 0 % (ref 0.0–0.2)

## 2020-02-13 LAB — COMPREHENSIVE METABOLIC PANEL
ALT: 24 U/L (ref 0–44)
AST: 66 U/L — ABNORMAL HIGH (ref 15–41)
Albumin: 2.9 g/dL — ABNORMAL LOW (ref 3.5–5.0)
Alkaline Phosphatase: 39 U/L (ref 38–126)
Anion gap: 10 (ref 5–15)
BUN: 40 mg/dL — ABNORMAL HIGH (ref 8–23)
CO2: 21 mmol/L — ABNORMAL LOW (ref 22–32)
Calcium: 8.1 mg/dL — ABNORMAL LOW (ref 8.9–10.3)
Chloride: 99 mmol/L (ref 98–111)
Creatinine, Ser: 1.84 mg/dL — ABNORMAL HIGH (ref 0.61–1.24)
GFR, Estimated: 38 mL/min — ABNORMAL LOW (ref >60)
Glucose, Bld: 123 mg/dL — ABNORMAL HIGH (ref 70–99)
Potassium: 4.8 mmol/L (ref 3.5–5.1)
Sodium: 130 mmol/L — ABNORMAL LOW (ref 135–145)
Total Bilirubin: 1 mg/dL (ref 0.3–1.2)
Total Protein: 5.5 g/dL — ABNORMAL LOW (ref 6.5–8.1)

## 2020-02-13 LAB — PROTIME-INR
INR: 1.2 (ref 0.8–1.2)
Prothrombin Time: 14.5 seconds (ref 11.4–15.2)

## 2020-02-13 LAB — GLUCOSE, CAPILLARY
Glucose-Capillary: 146 mg/dL — ABNORMAL HIGH (ref 70–99)
Glucose-Capillary: 373 mg/dL — ABNORMAL HIGH (ref 70–99)
Glucose-Capillary: 138 mg/dL — ABNORMAL HIGH (ref 70–99)

## 2020-02-13 LAB — C-REACTIVE PROTEIN: CRP: 7.2 mg/dL — ABNORMAL HIGH (ref <1.0)

## 2020-02-13 LAB — D-DIMER, QUANTITATIVE: D-Dimer, Quant: 0.66 ug/mL-FEU — ABNORMAL HIGH (ref 0.00–0.50)

## 2020-02-13 LAB — APTT: aPTT: 153 seconds — ABNORMAL HIGH (ref 24–36)

## 2020-02-13 LAB — FERRITIN: Ferritin: 99 ng/mL (ref 24–336)

## 2020-02-13 LAB — HEPARIN LEVEL (UNFRACTIONATED)
Heparin Unfractionated: 0.78 IU/mL — ABNORMAL HIGH (ref 0.30–0.70)
Heparin Unfractionated: 1.08 IU/mL — ABNORMAL HIGH (ref 0.30–0.70)
Heparin Unfractionated: 0.49 IU/mL (ref 0.30–0.70)

## 2020-02-13 LAB — MAGNESIUM: Magnesium: 2.2 mg/dL (ref 1.7–2.4)

## 2020-02-13 LAB — PHOSPHORUS: Phosphorus: 3 mg/dL (ref 2.5–4.6)

## 2020-02-13 MED ORDER — ATORVASTATIN CALCIUM 80 MG PO TABS: 80.0000 mg | ORAL_TABLET | Freq: Every day | ORAL | Status: DC

## 2020-02-13 MED ORDER — ATORVASTATIN CALCIUM 80 MG PO TABS
80.0000 mg | ORAL_TABLET | Freq: Every day | ORAL | Status: DC
Start: 2020-02-14 — End: 2020-02-18
  Administered 2020-02-14 – 2020-02-18 (×5): 80 mg via ORAL
  Filled 2020-02-13 (×5): qty 1

## 2020-02-13 NOTE — Progress Notes (Signed)
PROGRESS NOTE  Daniel Myers FXT:[id number] DOB: 14-Feb-1945 DOA: 02/11/2020 PCP: Claretta Fraise, MD  Brief History   75 year old man PMH including diabetes mellitus type 2, right kidney transplant, presented with shortness of breath and chest pain.  Admitted for acute hypoxic respiratory failure secondary to COVID-19 pneumonia, chest pain evaluation.  Found to have NSTEMI and transferred to [facility] for further evaluation.  NSTEMI --appears stable, asymptomatic --2D echocardiogram showed mild LV dysfunction with segmental wall motion abnormalities --Cardiology recommended medical management in view of of renal insufficiency and history of renal transplant --Continue IV heparin, beta-blocker, aspirin.  Consider Lexiscan.  Acute combined systolic and diastolic CHF --Continue beta-blocker, unable to add ACE inhibitor with renal dysfunction.  Appears euvolemic at this point.  Acute hypoxic respiratory failure secondary to COVID-19 pneumonia --Oxygenation improved, down to 2 L --Appears to be improving.  Continue remdesivir and steroids.  --CXR no acute disease --oxygen 2L --inflammatory markers . Ferritin 117 > 99 . CRP 2.4 > 7.2  . Ddimer .68 > .66  --Tx . Remdesivir  . Steroids . Actemra/baricitinab not indicated . Prone as tolerated . Wean oxygen as tolerated  Thrombocytopenia --may be chronic w/ acute component, was seen 5/21, 8/21, 11/21 --follow closely, will continue heparin for now  Hyponatremia --Stable, mild asymptomatic secondary to acute Covid.  Monitor.  Elevated AST --Likely secondary to Covid.  Follow.  Status post renal transplant --Continue immunosuppressants  Diabetes mellitus type 2, A1c 6.9 --CBG stable, continue Levemir and linagliptin  AKI superimposed on CKD stage IIIa --Likely secondary to Lasix yesterday.  Recheck in a.m.   Disposition Plan:  Discussion: Continue management per cardiology, in regard to Covid, seems to be improving.  Status  is: Inpatient  Remains inpatient appropriate because:IV treatments appropriate due to intensity of illness or inability to take PO and Inpatient level of care appropriate due to severity of illness   Dispo: The patient is from: Home              Anticipated d/c is to: Home              Anticipated d/c date is: 2 days              Patient currently is not medically stable to d/c.  DVT prophylaxis: SCDs Start: 02/12/20 0540 SCDs Start: 02/12/20 0346   Code Status: Full Code Family Communication: none  Murray Hodgkins, MD  [facility] Direct contact: see [email or web address] (further directions at bottom of note if needed) 7PM-7AM contact night coverage as at bottom of note 02/13/2020, 3:25 PM  LOS: 1 day   Significant Hospital Events   .    Consults:  . Cardiology      Procedures:  .   Significant Diagnostic Tests:  Marland Kitchen    Micro Data:  .    Antimicrobials:  .   Interval History/Subjective  CC: f/u NSTEMI  Feels fine, no SOB now, no pain.  Objective   Vitals:  Vitals:   02/13/20 0400 02/13/20 1000  BP: 135/69 (!) 141/82  Pulse: 64   Resp: 19   Temp: 97.9 F (36.6 C)   SpO2: 97%     Exam:  Constitutional:   . Appears calm and comfortable ENMT:  . grossly normal hearing  Respiratory:  . CTA bilaterally, no w/r/r.  . Respiratory effort normal.  Cardiovascular:  . RRR, no m/r/g . No LE extremity edema   Psychiatric:  . Mental status o Mood, affect appropriate  I have personally  reviewed the following:   Today's Data  . CBG stable . Na+ stable at 130 . Creatinine up to 1.84 . AST up to 66 . Plts down to 85  Scheduled Meds: . aerochamber Z-Stat Plus/medium  1 each Other Once  . amLODipine  10 mg Oral Daily  . vitamin C  500 mg Oral Daily  . aspirin EC  81 mg Oral Daily  . [START ON 02/14/2020] atorvastatin  80 mg Oral Daily  . insulin aspart  0-20 Units Subcutaneous Q4H  . insulin detemir  0.075 Units/kg Subcutaneous BID  . linagliptin  5 mg Oral  Daily  . methylPREDNISolone (SOLU-MEDROL) injection  40 mg Intravenous Q12H   Followed by  . [START ON 02/15/2020] predniSONE  50 mg Oral Daily  . metoprolol tartrate  25 mg Oral BID  . mycophenolate  500 mg Oral BID  . pantoprazole  40 mg Oral Daily  . tacrolimus  1 mg Oral BID  . zinc sulfate  220 mg Oral Daily   Continuous Infusions: . heparin 1,150 Units/hr (02/12/20 1646)  . remdesivir 100 mg in NS 100 mL 100 mg (02/13/20 1007)    Principal Problem:   NSTEMI (non-ST elevated myocardial infarction) ([place]) Active Problems:   Essential hypertension   Insulin dependent type 2 diabetes mellitus ([place])   Renal transplant recipient   Mixed hyperlipidemia   Hyponatremia   Pneumonia due to COVID-19 virus   Hyperglycemia due to diabetes mellitus ([place])   Acute respiratory failure with hypoxia (HCC)   CKD (chronic kidney disease), stage III (HCC)   Systolic and diastolic CHF, acute ([place])   Thrombocytopenia ([place])   AKI (acute kidney injury) ([place])   LOS: 1 day   How to contact the [facility] Attending or Consulting provider 7A - 7P or covering provider during after hours [place], for this patient?  1. Check the care team in [facility] and look for a) attending/consulting TR[facility] provider listed and b) the [facility] team listed 2. Log into [email or web address] and use [place]'s universal password to access. If you do not have the password, please contact the hospital operator. 3. Locate the [facility] provider you are looking for under [facility] and page to a number that you can be directly reached. 4. If you still have difficulty reaching the provider, please page the [facility] (Director on Call) for the Hospitalists listed on amion for assistance.

## 2020-02-13 NOTE — Progress Notes (Signed)
[place] for heparin Indication: chest pain/ACS/NSTEMI  Allergies  Allergen Reactions  . Other Rash    Use paper tape.   . Tape Rash    Use paper tape.   . Sulfur Rash    Patient Measurements: Height: 5\' 10"  (177.8 cm) Weight: 96.3 kg (212 lb 4.9 oz) IBW/kg (Calculated) : 73 Heparin Dosing Weight: 93 kg  Vital Signs: Temp: 97.9 F (36.6 C) (01/05 0400) Temp Source: Oral (01/05 0400) BP: 141/82 (01/05 1000) Pulse Rate: 64 (01/05 0400)  Labs: Recent Labs    02/11/20 2330 02/12/20 0110 02/12/20 0742 02/12/20 0904 02/12/20 1029 02/13/20 0145  HGB 15.7  --   --   --   --  13.6  HCT 46.0  --   --   --   --  39.8  PLT 102*  --   --   --   --  85*  APTT  --   --   --   --   --  153*  LABPROT  --   --   --   --   --  14.5  INR  --   --   --   --   --  1.2  HEPARINUNFRC  --   --   --   --   --  0.78*  CREATININE 1.52*  --   --   --   --  1.84*  TROPONINIHS 372*   < > 8,490* 8,393* 8,647*  --    < > = values in this interval not displayed.    Estimated Creatinine Clearance: 41.6 mL/min (A) (by C-G formula based on SCr of 1.84 mg/dL (H)).   Assessment: 75 y.o. male with chest pain for heparin.  Heparin level slightly above goal this morning felt somewhat due to renal dysfunction and previous lovenox dosing. Repeat level now >1. No bleeding issues noted.   Goal of Therapy:  Heparin level 0.3-0.7 units/ml Monitor platelets by anticoagulation protocol: Yes   Plan:  Reduce heparin to 850/hr and recheck tonight Planning 48 hours of heparin treatment - off tomorrow afternoon  Erin Hearing PharmD., BCPS Clinical Pharmacist 02/13/2020 1:49 PM

## 2020-02-13 NOTE — Progress Notes (Addendum)
Progress Note  Due to the COVID-19 pandemic, this visit was completed with telemedicine (audio/video) technology to reduce patient and provider exposure as well as to preserve personal protective equipment.   Patient Name: Daniel Myers Date of Encounter: 02/13/2020  Primary Cardiologist: Rozann Lesches, MD   Subjective   Talked with patient over the phone. Reports he is more short of breath this morning. No chest pain.   Inpatient Medications    Scheduled Meds: . aerochamber Z-Stat Plus/medium  1 each Other Once  . amLODipine  10 mg Oral Daily  . vitamin C  500 mg Oral Daily  . aspirin EC  81 mg Oral Daily  . insulin aspart  0-20 Units Subcutaneous Q4H  . insulin detemir  0.075 Units/kg Subcutaneous BID  . linagliptin  5 mg Oral Daily  . methylPREDNISolone (SOLU-MEDROL) injection  40 mg Intravenous Q12H   Followed by  . [START ON 02/15/2020] predniSONE  50 mg Oral Daily  . metoprolol tartrate  25 mg Oral BID  . mycophenolate  500 mg Oral BID  . pantoprazole  40 mg Oral Daily  . pravastatin  80 mg Oral Daily  . tacrolimus  1 mg Oral BID  . zinc sulfate  220 mg Oral Daily   Continuous Infusions: . heparin 1,150 Units/hr (02/12/20 1646)  . remdesivir 100 mg in NS 100 mL     PRN Meds: acetaminophen, albuterol, chlorpheniramine-HYDROcodone, guaiFENesin-dextromethorphan, ondansetron **OR** ondansetron (ZOFRAN) IV   Vital Signs    Vitals:   02/13/20 0100 02/13/20 0200 02/13/20 0300 02/13/20 0400  BP:    135/69  Pulse:    64  Resp: 16 19 17 19   Temp:    97.9 F (36.6 C)  TempSrc:    Oral  SpO2: (!) 83% 99% 96% 97%  Weight:      Height:        Intake/Output Summary (Last 24 hours) at 02/13/2020 0944 Last data filed at 02/13/2020 0400 Gross per 24 hour  Intake 480 ml  Output 1100 ml  Net -620 ml   Last 3 Weights 02/12/2020 02/11/2020 01/02/2020  Weight (lbs) 212 lb 4.9 oz 208 lb 210 lb 6 oz  Weight (kg) 96.3 kg 94.348 kg 95.425 kg      Telemetry    SR-->SB at  times - Personally Reviewed  ECG    No new tracing this morning  Physical Exam   VITAL SIGNS:  reviewed  Labs    Chemistry Recent Labs  Lab 02/11/20 2330 02/13/20 0145  NA 129* 130*  K 3.9 4.8  CL 98 99  CO2 21* 21*  GLUCOSE 198* 123*  BUN 20 40*  CREATININE 1.52* 1.84*  CALCIUM 8.6* 8.1*  PROT 6.6 5.5*  ALBUMIN 3.6 2.9*  AST 34 66*  ALT 23 24  ALKPHOS 51 39  BILITOT 0.8 1.0  GFRNONAA 48* 38*  ANIONGAP 10 10     Hematology Recent Labs  Lab 02/11/20 2330 02/13/20 0145  WBC 4.8 5.6  RBC 5.32 4.71  HGB 15.7 13.6  HCT 46.0 39.8  MCV 86.5 84.5  MCH 29.5 28.9  MCHC 34.1 34.2  RDW 12.5 12.3  PLT 102* 85*    Cardiac EnzymesNo results for input(s): TROPONINI in the last 168 hours. No results for input(s): TROPIPOC in the last 168 hours.   BNP Recent Labs  Lab 02/11/20 2330  BNP 842.0*     DDimer  Recent Labs  Lab 02/11/20 2330 02/13/20 0145  DDIMER 0.68* 0.66*  Radiology    DG Chest Port 1 View  Result Date: 02/11/2020 CLINICAL DATA:  Chest heaviness and shortness of breath. EXAM: PORTABLE CHEST 1 VIEW COMPARISON:  July 30, 2005 FINDINGS: Mild, diffuse, chronic appearing increased interstitial lung markings are seen. There is no evidence of acute infiltrate, pleural effusion or pneumothorax. The cardiac silhouette is borderline in size and stable in appearance. The visualized skeletal structures are unremarkable. IMPRESSION: Chronic appearing increased lung markings without evidence of acute or active cardiopulmonary disease. Electronically Signed   By: Virgina Norfolk M.D.   On: 02/11/2020 23:42   ECHOCARDIOGRAM COMPLETE  Result Date: 02/12/2020    ECHOCARDIOGRAM REPORT   Patient Name:   Daniel Myers Date of Exam: 02/12/2020 Medical Rec #:  [id number]     Height:       70.0 in Accession #:    [id number]    Weight:       212.3 lb Date of Birth:  07-08-1945     BSA:          2.141 m Patient Age:    75 years      BP:           138/69 mmHg Patient  Gender: M             HR:           61 bpm. Exam Location:  [facility] Procedure: 2D Echo, Cardiac Doppler and Color Doppler Indications:    CHF-Acute Diastolic E26.83  History:        Patient has no prior history of Echocardiogram examinations.                 Risk Factors:Hypertension, Dyslipidemia and Diabetes. COVID-19                 virus infection, Acute respiratory failure with hypoxia,                 Elevated BNP, Elevated troponin, Renal transplant recipient.  Sonographer:    Alvino Chapel RCS Referring Phys: [id number] OLADAPO ADEFESO IMPRESSIONS  1. Left ventricular ejection fraction, by estimation, is 40 to 45%. The left ventricle has mildly decreased function. The left ventricle demonstrates regional wall motion abnormalities (see scoring diagram/findings for description). The left ventricular  internal cavity size was mildly dilated. There is mild left ventricular hypertrophy. Left ventricular diastolic parameters are suggestive of Grade III diastolic dysfunction (restrictive).  2. Right ventricular systolic function is normal. The right ventricular size is normal. Tricuspid regurgitation signal is inadequate for assessing PA pressure.  3. Left atrial size was severely dilated.  4. The mitral valve is grossly normal, mildly calcified. Trivial mitral valve regurgitation.  5. The aortic valve is tricuspid. Aortic valve regurgitation is not visualized.  6. The inferior vena cava is dilated in size with >50% respiratory variability, suggesting right atrial pressure of 8 mmHg. FINDINGS  Left Ventricle: Left ventricular ejection fraction, by estimation, is 40 to 45%. The left ventricle has mildly decreased function. The left ventricle demonstrates regional wall motion abnormalities. The left ventricular internal cavity size was mildly dilated. There is mild left ventricular hypertrophy. Left ventricular diastolic parameters are consistent with Grade III diastolic dysfunction (restrictive).  LV Wall Scoring:  The basal inferolateral segment, apical septal segment, basal inferior segment, and apex are akinetic. The antero-lateral wall, mid and distal lateral wall, mid and distal inferior wall, mid anteroseptal segment, and mid inferoseptal segment are hypokinetic. The entire anterior wall, basal anteroseptal segment, and basal  inferoseptal segment are normal. Right Ventricle: The right ventricular size is normal. No increase in right ventricular wall thickness. Right ventricular systolic function is normal. Tricuspid regurgitation signal is inadequate for assessing PA pressure. Left Atrium: Left atrial size was severely dilated. Right Atrium: Right atrial size was normal in size. Pericardium: There is no evidence of pericardial effusion. Presence of pericardial fat pad. Mitral Valve: The mitral valve is grossly normal. There is mild calcification of the mitral valve leaflet(s). Trivial mitral valve regurgitation. Tricuspid Valve: The tricuspid valve is grossly normal. Tricuspid valve regurgitation is trivial. Aortic Valve: The aortic valve is tricuspid. There is mild aortic valve annular calcification. Aortic valve regurgitation is not visualized. Pulmonic Valve: The pulmonic valve was not well visualized. Pulmonic valve regurgitation is trivial. Aorta: The aortic root is normal in size and structure. Venous: The inferior vena cava is dilated in size with greater than 50% respiratory variability, suggesting right atrial pressure of 8 mmHg. IAS/Shunts: No atrial level shunt detected by color flow Doppler.  LEFT VENTRICLE PLAX 2D LVIDd:         6.10 cm      Diastology LVIDs:         4.85 cm      LV e' medial:    5.00 cm/s LV PW:         1.20 cm      LV E/e' medial:  27.0 LV IVS:        1.30 cm      LV e' lateral:   5.55 cm/s LVOT diam:     2.20 cm      LV E/e' lateral: 24.3 LV SV:         74 LV SV Index:   35 LVOT Area:     3.80 cm  LV Volumes (MOD) LV vol d, MOD A2C: 144.5 ml LV vol d, MOD A4C: 161.0 ml LV vol s, MOD A2C:  85.4 ml LV vol s, MOD A4C: 67.1 ml LV SV MOD A2C:     59.1 ml LV SV MOD A4C:     161.0 ml LV SV MOD BP:      77.2 ml RIGHT VENTRICLE RV S prime:     12.00 cm/s TAPSE (M-mode): 2.6 cm LEFT ATRIUM              Index       RIGHT ATRIUM           Index LA diam:        3.90 cm  1.82 cm/m  RA Area:     20.00 cm LA Vol (A2C):   116.0 ml 54.18 ml/m RA Volume:   57.40 ml  26.81 ml/m LA Vol (A4C):   119.0 ml 55.58 ml/m LA Biplane Vol: 118.0 ml 55.12 ml/m  AORTIC VALVE LVOT Vmax:   86.70 cm/s LVOT Vmean:  62.000 cm/s LVOT VTI:    0.195 m  AORTA Ao Root diam: 3.70 cm MITRAL VALVE MV Area (PHT): 5.27 cm     SHUNTS MV Decel Time: 144 msec     Systemic VTI:  0.20 m MV E velocity: 135.00 cm/s  Systemic Diam: 2.20 cm MV A velocity: 46.50 cm/s MV E/A ratio:  2.90 Rozann Lesches MD Electronically signed by Rozann Lesches MD Signature Date/Time: 02/12/2020/4:58:17 PM    Final     Cardiac Studies   Echo: 02/12/20  IMPRESSIONS    1. Left ventricular ejection fraction, by estimation, is 40 to 45%. The  left ventricle has  mildly decreased function. The left ventricle  demonstrates regional wall motion abnormalities (see scoring  diagram/findings for description). The left ventricular  internal cavity size was mildly dilated. There is mild left ventricular  hypertrophy. Left ventricular diastolic parameters are suggestive of Grade  III diastolic dysfunction (restrictive).  2. Right ventricular systolic function is normal. The right ventricular  size is normal. Tricuspid regurgitation signal is inadequate for assessing  PA pressure.  3. Left atrial size was severely dilated.  4. The mitral valve is grossly normal, mildly calcified. Trivial mitral  valve regurgitation.  5. The aortic valve is tricuspid. Aortic valve regurgitation is not  visualized.  6. The inferior vena cava is dilated in size with >50% respiratory  variability, suggesting right atrial pressure of 8 mmHg.   FINDINGS  Left Ventricle:  Left ventricular ejection fraction, by estimation, is 40  to 45%. The left ventricle has mildly decreased function. The left  ventricle demonstrates regional wall motion abnormalities. The left  ventricular internal cavity size was mildly  dilated. There is mild left ventricular hypertrophy. Left ventricular  diastolic parameters are consistent with Grade III diastolic dysfunction  (restrictive).     LV Wall Scoring:  The basal inferolateral segment, apical septal segment, basal inferior  segment, and apex are akinetic. The antero-lateral wall, mid and distal  lateral wall, mid and distal inferior wall, mid anteroseptal segment, and  mid  inferoseptal segment are hypokinetic. The entire anterior wall, basal  anteroseptal segment, and basal inferoseptal segment are normal.   Right Ventricle: The right ventricular size is normal. No increase in  right ventricular wall thickness. Right ventricular systolic function is  normal. Tricuspid regurgitation signal is inadequate for assessing PA  pressure.   Left Atrium: Left atrial size was severely dilated.   Right Atrium: Right atrial size was normal in size.   Pericardium: There is no evidence of pericardial effusion. Presence of  pericardial fat pad.   Mitral Valve: The mitral valve is grossly normal. There is mild  calcification of the mitral valve leaflet(s). Trivial mitral valve  regurgitation.   Tricuspid Valve: The tricuspid valve is grossly normal. Tricuspid valve  regurgitation is trivial.   Aortic Valve: The aortic valve is tricuspid. There is mild aortic valve  annular calcification. Aortic valve regurgitation is not visualized.   Pulmonic Valve: The pulmonic valve was not well visualized. Pulmonic valve  regurgitation is trivial.   Aorta: The aortic root is normal in size and structure.   Venous: The inferior vena cava is dilated in size with greater than 50%  respiratory variability, suggesting right atrial pressure  of 8 mmHg.   IAS/Shunts: No atrial level shunt detected by color flow Doppler.   Patient Profile     75 y.o. male  with a hx of HTN, HLD, DM2, obesity, and CKD with history of renal transplantation on immunosuppressive therapy who was seen  for the evaluation of chest pain and elevated troponins at the request of Dr. Manuella Ghazi by Dr. Domenic Polite and [facility] for further management.   Assessment & Plan    1. NSTEMI with abnormal EKG: hsTn peaked at 8490 in the setting of COVID-19 infection. No reported chest pain. Echo with EF of 40-45% with basal, inferolateral and apex are akinetic. Currently treated medically with IV heparin (plan for 48 hours), ASA, statin and BB therapy.   2. Acute combined systolic and diastolic HF: EF noted at 61-95% on echo. Low dose metoprolol 25mg  BID added yesterday. Unable to add  ACEi/ARB with renal dysfunction.   3. Acute respiratory failure in the setting of COVID: remains on [place] @2L . Treated with remdesivir and steroids per primary.  4. HTN: stable with the addition of metoprolol.   5. DM: Hgb A1c 6.9 -- on SSI, Levemir  6. HLD: will switch from pravastatin to high dose statin -- last lipids (11/21)- LDL 125, HDL 34  7. Hx of right renal transplant: per primary  For questions or updates, please contact [place] Please consult [email or web address] for contact info under        Signed, Reino Bellis, NP  02/13/2020, 9:44 AM     Agree with note by Reino Bellis NP-C  We were asked to see Mr. Killian because of chest pain, shortness of breath and positive enzymes.  He was seen by Dr. Kandra Nicolas [facility].  He has no prior cardiac history.  He does have risk factors including hypertension, hyperlipidemia and diabetes.  He does have a history of renal transplantation in the past with a serum creatinine of approximately 1.9 currently.  He has tested positive for Covid.  His chest x-ray shows no acute findings.  He was requiring high-dose oxygen  which has been weaned down.  His EKG does shows inferior Q waves and lateral T wave inversion we do not have old EKGs to compare this to.  His 2D echo reveals mild LV dysfunction with segmental wall motion abnormalities.  At this point, the plan is medical therapy given his Covid status, history of renal transplantation with moderate renal insufficiency.  I think he be high risk for radiocontrast nephropathy.  We will continue IV heparin, beta-blocker and aspirin.  He would be a candidate for Lexiscan Myoview toward the end of his Covid treatment to rule out potential high risk anatomy after which a risk-benefit analysis can be further performed.  Lorretta Harp, M.D., [place], [facility], [place] [address] [place], [place]  [id number]  [phone] 02/13/2020 10:50 AM

## 2020-02-13 NOTE — Progress Notes (Signed)
[place] for heparin Indication: chest pain/ACS/NSTEMI  Allergies  Allergen Reactions  . Other Rash    Use paper tape.   . Tape Rash    Use paper tape.   . Sulfur Rash    Patient Measurements: Height: 5\' 10"  (177.8 cm) Weight: 96.3 kg (212 lb 4.9 oz) IBW/kg (Calculated) : 73 Heparin Dosing Weight: 93 kg  Vital Signs: Temp: 98.1 F (36.7 C) (01/04 2000) Temp Source: Oral (01/04 2000) BP: 138/68 (01/04 2000) Pulse Rate: 67 (01/04 2000)  Labs: Recent Labs    02/11/20 2330 02/12/20 0110 02/12/20 0742 02/12/20 0904 02/12/20 1029 02/13/20 0145  HGB 15.7  --   --   --   --  13.6  HCT 46.0  --   --   --   --  39.8  PLT 102*  --   --   --   --  85*  HEPARINUNFRC  --   --   --   --   --  0.78*  CREATININE 1.52*  --   --   --   --  1.84*  TROPONINIHS 372*   < > 8,490* 8,393* 8,647*  --    < > = values in this interval not displayed.    Estimated Creatinine Clearance: 41.6 mL/min (A) (by C-G formula based on SCr of 1.84 mg/dL (H)).   Assessment: 75 y.o. male with chest pain for heparin.  Heparin level slightly above goal.    Goal of Therapy:  Heparin level 0.3-0.7 units/ml Monitor platelets by anticoagulation protocol: Yes   Plan:  Given renal insufficiency, feel Lovenox dose given yesterday likely still contributing to heparin level.  Will not change heparin rate at this time, recheck level in 8 hours, and adjust as needed at that time.  Phillis Knack, PharmD, BCPS  02/13/2020 3:41 AM

## 2020-02-13 NOTE — Hospital Course (Addendum)
75 year old man PMH including diabetes mellitus type 2, right kidney transplant, presented with shortness of breath and chest pain.  Admitted for acute hypoxic respiratory failure secondary to COVID-19 pneumonia, chest pain evaluation.  Found to have NSTEMI and transferred to [facility] for further evaluation.  Seen by cardiology, treated conservatively with medical therapy.  Mild Covid pneumonia responded well to remdesivir and steroids.  Developed volume overload and significant hyponatremia of unclear etiology.  Responded well to diuresis, with gradual improvement of sodium and resolution of hypoxia.  Now appears stable from cardiac and renal standpoint, discussed with both disciplines, cleared for discharge.  Close outpatient follow-up with cardiology and primary nephrologist.  NSTEMI.  Echocardiogram showed mild LV dysfunction and segmental wall motion abnormalities. --Continue medical management with beta-blocker and aspirin per cardiology.  Not on ACE inhibitor or ARB secondary to renal dysfunction. --Ischemic work-up being contemplated by cardiology as an outpatient after recovery  Acute hypoxic respiratory failure secondary to COVID-19 pneumonia and acute CHF. --Hypoxia resolved with treatment of Covid and CHF. --Home today  --CXR no acute disease on admission --oxygen RA --Tx Status post remdesivir  Steroid taper Actemra/baricitinab not indicated  Atrial fibrillation with rapid ventricular response --New diagnosis 1/9, has been in sinus rhythm up to that point both on EKG and telemetry.  Presumably secondary to respiratory drive from Covid and CHF. --Asymptomatic with heart rate in the 100s --Increase beta-blocker, follow-up TSH as an outpatient.   --CHA2DS2-VASc = 5. Cardiology has rec'd functional testing after recovery from [place].  Started apixaban.  Acute combined systolic and diastolic CHF --Excellent urine output with IV Lasix.  Appears euvolemic now. --Discussed with Dr.  Candiss Norse nephrology, recommends resuming home dose of Lasix and close follow-up. --Continue beta-blocker, unable to add ACE inhibitor with renal dysfunction.    Acute hyponatremia, asymptomatic, possibly multifactorial including Covid, volume overload, status post renal transplant.  Did not respond to fluids. --Secondary to CHF, slowly improving.   --Near normal  Nonoliguric AKI superimposed on CKD stage IIIa, status post renal transplant --Likely secondary to Covid.   --Creatinine slightly high but okay to go home per nephrology.  Hold Bactrim until follow-up with primary nephrologist. --Continue immunosuppressants.  Thrombocytopenia --Appears to be chronic w/ acute component, was seen 5/21, 8/21, 11/21 --Labile but stable  Elevated AST --secondary to Covid.  Trending down  Diabetes mellitus type 2, A1c 6.9 --CBG stable, resume outpatient regimen on discharge.

## 2020-02-13 NOTE — Progress Notes (Signed)
[place] for heparin Indication: chest pain/ACS/NSTEMI  Allergies  Allergen Reactions  . Other Rash    Use paper tape.   . Tape Rash    Use paper tape.   . Sulfur Rash    Patient Measurements: Height: 5\' 10"  (177.8 cm) Weight: 96.3 kg (212 lb 4.9 oz) IBW/kg (Calculated) : 73 Heparin Dosing Weight: 93 kg  Vital Signs: Temp: 97.9 F (36.6 C) (01/05 2209) Temp Source: Oral (01/05 2209) BP: 141/72 (01/05 2209) Pulse Rate: 67 (01/05 2209)  Labs: Recent Labs    02/11/20 2330 02/12/20 0110 02/12/20 0742 02/12/20 0904 02/12/20 1029 02/13/20 0145 02/13/20 1306 02/13/20 2244  HGB 15.7  --   --   --   --  13.6  --   --   HCT 46.0  --   --   --   --  39.8  --   --   PLT 102*  --   --   --   --  85*  --   --   APTT  --   --   --   --   --  153*  --   --   LABPROT  --   --   --   --   --  14.5  --   --   INR  --   --   --   --   --  1.2  --   --   HEPARINUNFRC  --   --   --   --   --  0.78* 1.08* 0.49  CREATININE 1.52*  --   --   --   --  1.84*  --   --   TROPONINIHS 372*   < > 8,490* 8,393* 8,647*  --   --   --    < > = values in this interval not displayed.    Estimated Creatinine Clearance: 41.6 mL/min (A) (by C-G formula based on SCr of 1.84 mg/dL (H)).  Assessment: 75 y.o. male with chest pain for heparin.    Heparin level therapeutic (0.49) on gtt at 850 units/hr. No bleeding noted.  Goal of Therapy:  Heparin level 0.3-0.7 units/ml Monitor platelets by anticoagulation protocol: Yes   Plan:  Continue heparin at 850 units/hr Planning 48 hours of heparin treatment - off tomorrow afternoon  Sherlon Handing, PharmD, BCPS Please see amion for complete clinical pharmacist phone list 02/13/2020 11:57 PM

## 2020-02-13 NOTE — Progress Notes (Signed)
Dr Antionette Char paged to restart anti-rejection medications for kidney transplant & to notify of patient arrival at [facility].  Overnight Patient has been afebrile but starting around 0100 patient found to be desaturating.   Assessed patient and found no new cough, chest pain or shortness of breath and patient initially sleeping.  Placed on 2L O2 and will continue to monitor.

## 2020-02-14 DIAGNOSIS — N179 Acute kidney failure, unspecified: Secondary | ICD-10-CM | POA: Diagnosis not present

## 2020-02-14 DIAGNOSIS — I214 Non-ST elevation (NSTEMI) myocardial infarction: Secondary | ICD-10-CM | POA: Diagnosis not present

## 2020-02-14 DIAGNOSIS — I5041 Acute combined systolic (congestive) and diastolic (congestive) heart failure: Secondary | ICD-10-CM

## 2020-02-14 DIAGNOSIS — J9601 Acute respiratory failure with hypoxia: Secondary | ICD-10-CM | POA: Diagnosis not present

## 2020-02-14 DIAGNOSIS — E782 Mixed hyperlipidemia: Secondary | ICD-10-CM

## 2020-02-14 DIAGNOSIS — U071 COVID-19: Secondary | ICD-10-CM | POA: Diagnosis not present

## 2020-02-14 LAB — FERRITIN: Ferritin: 269 ng/mL (ref 24–336)

## 2020-02-14 LAB — CBC WITH DIFFERENTIAL/PLATELET
Abs Immature Granulocytes: 0.09 10*3/uL — ABNORMAL HIGH (ref 0.00–0.07)
Basophils Absolute: 0 10*3/uL (ref 0.0–0.1)
Basophils Relative: 0 %
Eosinophils Absolute: 0 10*3/uL (ref 0.0–0.5)
Eosinophils Relative: 0 %
HCT: 43.7 % (ref 39.0–52.0)
Hemoglobin: 14.6 g/dL (ref 13.0–17.0)
Immature Granulocytes: 1 %
Lymphocytes Relative: 4 %
Lymphs Abs: 0.6 10*3/uL — ABNORMAL LOW (ref 0.7–4.0)
MCH: 28.4 pg (ref 26.0–34.0)
MCHC: 33.4 g/dL (ref 30.0–36.0)
MCV: 85 fL (ref 80.0–100.0)
Monocytes Absolute: 0.6 10*3/uL (ref 0.1–1.0)
Monocytes Relative: 4 %
Neutro Abs: 13 10*3/uL — ABNORMAL HIGH (ref 1.7–7.7)
Neutrophils Relative %: 91 %
Platelets: 148 10*3/uL — ABNORMAL LOW (ref 150–400)
RBC: 5.14 MIL/uL (ref 4.22–5.81)
RDW: 12.8 % (ref 11.5–15.5)
WBC: 14.3 10*3/uL — ABNORMAL HIGH (ref 4.0–10.5)
nRBC: 0 % (ref 0.0–0.2)

## 2020-02-14 LAB — COMPREHENSIVE METABOLIC PANEL
ALT: 36 U/L (ref 0–44)
AST: 90 U/L — ABNORMAL HIGH (ref 15–41)
Albumin: 3.4 g/dL — ABNORMAL LOW (ref 3.5–5.0)
Alkaline Phosphatase: 44 U/L (ref 38–126)
Anion gap: 11 (ref 5–15)
BUN: 51 mg/dL — ABNORMAL HIGH (ref 8–23)
CO2: 19 mmol/L — ABNORMAL LOW (ref 22–32)
Calcium: 7.9 mg/dL — ABNORMAL LOW (ref 8.9–10.3)
Chloride: 95 mmol/L — ABNORMAL LOW (ref 98–111)
Creatinine, Ser: 1.87 mg/dL — ABNORMAL HIGH (ref 0.61–1.24)
GFR, Estimated: 37 mL/min — ABNORMAL LOW (ref >60)
Glucose, Bld: 144 mg/dL — ABNORMAL HIGH (ref 70–99)
Potassium: 4.5 mmol/L (ref 3.5–5.1)
Sodium: 125 mmol/L — ABNORMAL LOW (ref 135–145)
Total Bilirubin: 0.9 mg/dL (ref 0.3–1.2)
Total Protein: 6.3 g/dL — ABNORMAL LOW (ref 6.5–8.1)

## 2020-02-14 LAB — D-DIMER, QUANTITATIVE: D-Dimer, Quant: 0.4 ug/mL-FEU (ref 0.00–0.50)

## 2020-02-14 LAB — GLUCOSE, CAPILLARY
Glucose-Capillary: 141 mg/dL — ABNORMAL HIGH (ref 70–99)
Glucose-Capillary: 149 mg/dL — ABNORMAL HIGH (ref 70–99)
Glucose-Capillary: 155 mg/dL — ABNORMAL HIGH (ref 70–99)
Glucose-Capillary: 217 mg/dL — ABNORMAL HIGH (ref 70–99)
Glucose-Capillary: 217 mg/dL — ABNORMAL HIGH (ref 70–99)

## 2020-02-14 LAB — HEPARIN LEVEL (UNFRACTIONATED)
Heparin Unfractionated: 0.46 IU/mL (ref 0.30–0.70)
Heparin Unfractionated: 0.46 IU/mL (ref 0.30–0.70)

## 2020-02-14 LAB — C-REACTIVE PROTEIN: CRP: 4 mg/dL — ABNORMAL HIGH (ref <1.0)

## 2020-02-14 LAB — MAGNESIUM: Magnesium: 2.4 mg/dL (ref 1.7–2.4)

## 2020-02-14 LAB — PHOSPHORUS: Phosphorus: 4.5 mg/dL (ref 2.5–4.6)

## 2020-02-14 MED ORDER — SODIUM CHLORIDE 0.9 % IV SOLN: INTRAVENOUS | Status: DC

## 2020-02-14 NOTE — Evaluation (Signed)
Physical Therapy Evaluation and Discharge Patient Details Name: Daniel Myers MRN: [id number] DOB: 01-01-1946 Today's Date: 02/14/2020   History of Present Illness  75 year old man PMH including diabetes mellitus type 2, right kidney transplant, CHF presented 02/12/20 with shortness of breath and chest pain.  Admitted for acute hypoxic respiratory failure secondary to COVID-19 pneumonia and NSTEMI.  Clinical Impression   Patient evaluated by Physical Therapy with no further acute PT needs identified. All education has been completed and the patient has no further questions. Patient lives alone and independent PTA. Denies h/o imbalance or falls. Remains independent without need of PT.  PT is signing off. Thank you for this referral.     Follow Up Recommendations No PT follow up    Equipment Recommendations  None recommended by PT    Recommendations for Other Services       Precautions / Restrictions Precautions Precautions: None      Mobility  Bed Mobility Overal bed mobility: Independent                  Transfers Overall transfer level: Independent                  Ambulation/Gait Ambulation/Gait assistance: Independent Gait Distance (Feet): 60 Feet Assistive device: None Gait Pattern/deviations: WFL(Within Functional Limits)   Gait velocity interpretation: 1.31 - 2.62 ft/sec, indicative of limited community ambulator General Gait Details: pt reports feet are swollen and walking feels "slightly off" Reports no h/o loss of balance or falls  Stairs            Wheelchair Mobility    Modified Rankin (Stroke Patients Only)       Balance Overall balance assessment: Independent                       [place] - Eyes Opened: 30 [place] - Eyes Closed: 10 High level balance activites: Backward walking;Direction changes;Turns;Sudden stops               Pertinent Vitals/Pain Pain Assessment: No/denies pain    Home Living  Family/patient expects to be discharged to:: Private residence Living Arrangements: Alone Available Help at Discharge: Family;Available PRN/intermittently (daughter and grandaughters live next door) Type of Home: House         Home Equipment: None      Prior Function Level of Independence: Independent               Hand Dominance        Extremity/Trunk Assessment   Upper Extremity Assessment Upper Extremity Assessment: Overall WFL for tasks assessed    Lower Extremity Assessment Lower Extremity Assessment: Overall WFL for tasks assessed    Cervical / Trunk Assessment Cervical / Trunk Assessment: Normal  Communication   Communication: No difficulties  Cognition Arousal/Alertness: Awake/alert Behavior During Therapy: WFL for tasks assessed/performed Overall Cognitive Status: Within Functional Limits for tasks assessed                                        General Comments General comments (skin integrity, edema, etc.): Reports he has been walking to bathroom, pushing IV when necessary, alone without difficulty or issues    Exercises     Assessment/Plan    PT Assessment Patent does not need any further PT services  PT Problem List         PT Treatment Interventions  PT Goals (Current goals can be found in the Care Plan section)  Acute Rehab PT Goals Patient Stated Goal: return home ASAP PT Goal Formulation: All assessment and education complete, DC therapy    Frequency     Barriers to discharge        Co-evaluation               AM-PAC PT "6 Clicks" Mobility  Outcome Measure Help needed turning from your back to your side while in a flat bed without using bedrails?: None Help needed moving from lying on your back to sitting on the side of a flat bed without using bedrails?: None Help needed moving to and from a bed to a chair (including a wheelchair)?: None Help needed standing up from a chair using your arms (e.g.,  wheelchair or bedside chair)?: None Help needed to walk in hospital room?: None Help needed climbing 3-5 steps with a railing? : None 6 Click Score: 24    End of Session   Activity Tolerance: Patient tolerated treatment well Patient left: in bed;with call bell/phone within reach (reports sat up most of day and wanted to return to bed) Nurse Communication: Other (comment) (IV beeping complete) PT Visit Diagnosis: Unsteadiness on feet (R26.81)    Time: 3976-7341 PT Time Calculation (min) (ACUTE ONLY): 14 min   Charges:   PT Evaluation $PT Eval Low Complexity: 1 Low           Arby Barrette, PT Pager [phone]   Rexanne Mano 02/14/2020, 2:55 PM

## 2020-02-14 NOTE — Progress Notes (Signed)
[place] for heparin Indication: chest pain/ACS/NSTEMI  Allergies  Allergen Reactions  . Other Rash    Use paper tape.   . Tape Rash    Use paper tape.   . Sulfur Rash    Patient Measurements: Height: 5\' 10"  (177.8 cm) Weight: 96.3 kg (212 lb 4.9 oz) IBW/kg (Calculated) : 73 Heparin Dosing Weight: 93 kg  Vital Signs: Temp: 97.3 F (36.3 C) (01/06 0800) Temp Source: Oral (01/06 0800) BP: 153/91 (01/06 0800) Pulse Rate: 67 (01/05 2209)  Labs: Recent Labs    02/11/20 2330 02/11/20 2330 02/12/20 0110 02/12/20 0742 02/12/20 0904 02/12/20 1029 02/13/20 0145 02/13/20 1306 02/13/20 2244 02/14/20 0405  HGB 15.7  --   --   --   --   --  13.6  --   --  14.6  HCT 46.0  --   --   --   --   --  39.8  --   --  43.7  PLT 102*  --   --   --   --   --  85*  --   --  148*  APTT  --   --   --   --   --   --  153*  --   --   --   LABPROT  --   --   --   --   --   --  14.5  --   --   --   INR  --   --   --   --   --   --  1.2  --   --   --   HEPARINUNFRC  --    < >  --   --   --   --  0.78* 1.08* 0.49 0.46  CREATININE 1.52*  --   --   --   --   --  1.84*  --   --  1.87*  TROPONINIHS 372*  --    < > 8,490* 8,393* 8,647*  --   --   --   --    < > = values in this interval not displayed.    Estimated Creatinine Clearance: 41 mL/min (A) (by C-G formula based on SCr of 1.87 mg/dL (H)).  Assessment: 75 y.o. male with chest pain for heparin.    Heparin level therapeutic (0.4) this morning on gtt at 850 units/hr. No bleeding noted. CBC has remained stable.  Goal of Therapy:  Heparin level 0.3-0.7 units/ml Monitor platelets by anticoagulation protocol: Yes   Plan:  Continue heparin at 850 units/hr Heparin to stop this afternoon at [place] PharmD., BCPS Clinical Pharmacist 02/14/2020 8:39 AM

## 2020-02-14 NOTE — Progress Notes (Signed)
PROGRESS NOTE  Daniel Myers IRJ:[id number] DOB: [birth year] DOA: 02/11/2020 PCP: Claretta Fraise, MD  Brief History   75 year old man PMH including diabetes mellitus type 2, right kidney transplant, presented with shortness of breath and chest pain.  Admitted for acute hypoxic respiratory failure secondary to COVID-19 pneumonia, chest pain evaluation.  Found to have NSTEMI and transferred to [facility] for further evaluation.  NSTEMI --Remains stable, and asymptomatic. --2D echocardiogram showed mild LV dysfunction with segmental wall motion abnormalities --Cardiology recommended medical management in view of of renal insufficiency and history of renal transplant --We will continue IV heparin, beta-blocker, aspirin.  Consider Lexiscan per cardiology, await further recommendations.  Acute combined systolic and diastolic CHF --Appears compensated at this point, in fact appears dry.  Continue beta-blocker, unable to add ACE inhibitor with renal dysfunction.    Acute hypoxic respiratory failure secondary to COVID-19 pneumonia --Oxygenation stable on 2 L.  Wean as tolerated.  May need home oxygen. --PT evaluation --Continue remdesivir and steroids.  --CXR no acute disease --oxygen 2L --inflammatory markers . Ferritin 117 > 99 > 269 . CRP 2.4 > 7.2 > 4.0 . Ddimer .68 > .66 > .40  --Tx . Remdesivir  . Steroids . Actemra/baricitinab not indicated . Prone as tolerated . Wean oxygen as tolerated  Thrombocytopenia --may be chronic w/ acute component, was seen 5/21, 8/21, 11/21 --Now up to 148, follow-up as an outpatient  Hyponatremia --Secondary to acute illness, probably complicated by poor oral intake.  Gentle IV fluids, hold Lasix. BMP in AM  Elevated AST --Likely secondary to Covid.  Mildly higher today.  Follow.  Status post renal transplant --Appears stable.  Continue immunosuppressants  Diabetes mellitus type 2, A1c 6.9 --CBG remains stable, continue Levemir and  linagliptin  AKI superimposed on CKD stage IIIa --Likely secondary to diuresis.  Creatinine appears to be stabilizing.  Now appears dry.  Fluids, hold diuretic, check in a.m.   Disposition Plan:  Discussion: Appears to be improving.  Continue heart management per to cardiology.  From Covid standpoint can likely go home tomorrow.  Wean oxygen as tolerated.  May need home oxygen.  Status is: Inpatient  Remains inpatient appropriate because:IV treatments appropriate due to intensity of illness or inability to take PO and Inpatient level of care appropriate due to severity of illness   Dispo: The patient is from: Home              Anticipated d/c is to: Home              Anticipated d/c date is: 1 day              Patient currently is not medically stable to d/c.  DVT prophylaxis: SCDs Start: 02/12/20 0540 SCDs Start: 02/12/20 0346   Code Status: Full Code Family Communication: none  Murray Hodgkins, MD  [facility] Direct contact: see [email or web address] (further directions at bottom of note if needed) 7PM-7AM contact night coverage as at bottom of note 02/14/2020, 12:58 PM  LOS: 2 days   Significant Hospital Events   .    Consults:  . Cardiology      Procedures:  .   Significant Diagnostic Tests:  Marland Kitchen    Micro Data:  .    Antimicrobials:  .   Interval History/Subjective  CC: f/u NSTEMI  Was short of breath earlier but not now.  No pain.  Objective   Vitals:  Vitals:   02/14/20 0800 02/14/20 0922  BP: (!) 153/91  Pulse:  75  Resp: (!) 27   Temp: (!) 97.3 F (36.3 C)   SpO2: 93%     Exam:  Constitutional:   . Appears calm and comfortable sitting in chair ENMT:  . grossly normal hearing  Respiratory:  . CTA bilaterally, no w/r/r.  . Respiratory effort normal.  Cardiovascular:  . RRR, no m/r/g . No LE extremity edema   Psychiatric:  . Mental status o Mood, affect appropriate  I have personally reviewed the following:   Today's Data  . CBG  stable . Sodium down to 125, chloride down to 19 . BUN up to 51, creatinine stable at 1.87 . AST slightly higher at 90 . WBC 14.3 on steroids, likely insignificant  Scheduled Meds: . aerochamber Z-Stat Plus/medium  1 each Other Once  . amLODipine  10 mg Oral Daily  . vitamin C  500 mg Oral Daily  . aspirin EC  81 mg Oral Daily  . atorvastatin  80 mg Oral Daily  . insulin aspart  0-20 Units Subcutaneous Q4H  . insulin detemir  0.075 Units/kg Subcutaneous BID  . linagliptin  5 mg Oral Daily  . methylPREDNISolone (SOLU-MEDROL) injection  40 mg Intravenous Q12H   Followed by  . [START ON 02/15/2020] predniSONE  50 mg Oral Daily  . metoprolol tartrate  25 mg Oral BID  . mycophenolate  500 mg Oral BID  . pantoprazole  40 mg Oral Daily  . tacrolimus  1 mg Oral BID  . zinc sulfate  220 mg Oral Daily   Continuous Infusions: . sodium chloride 75 mL/hr at 02/14/20 0919  . heparin 850 Units/hr (02/13/20 1533)  . remdesivir 100 mg in NS 100 mL 100 mg (02/14/20 0919)    Principal Problem:   NSTEMI (non-ST elevated myocardial infarction) ([place]) Active Problems:   Essential hypertension   Insulin dependent type 2 diabetes mellitus ([place])   Renal transplant recipient   Mixed hyperlipidemia   Hyponatremia   Pneumonia due to COVID-19 virus   Hyperglycemia due to diabetes mellitus (HCC)   Acute respiratory failure with hypoxia (HCC)   CKD (chronic kidney disease), stage III (HCC)   Systolic and diastolic CHF, acute ([place])   Thrombocytopenia ([place])   AKI (acute kidney injury) ([place])   LOS: 2 days   How to contact the [facility] Attending or Consulting provider [place] or covering provider during after hours 7P -7A, for this patient?  1. Check the care team in [facility] and look for a) attending/consulting [facility] provider listed and b) the [facility] team listed 2. Log into [email or web address] and use [place]'s universal password to access. If you do not have the password, please contact the hospital  operator. 3. Locate the [facility] provider you are looking for under [facility] and page to a number that you can be directly reached. 4. If you still have difficulty reaching the provider, please page the [facility] (Director on Call) for the Hospitalists listed on amion for assistance.

## 2020-02-15 DIAGNOSIS — E782 Mixed hyperlipidemia: Secondary | ICD-10-CM | POA: Diagnosis not present

## 2020-02-15 DIAGNOSIS — I214 Non-ST elevation (NSTEMI) myocardial infarction: Secondary | ICD-10-CM | POA: Diagnosis not present

## 2020-02-15 DIAGNOSIS — U071 COVID-19: Secondary | ICD-10-CM | POA: Diagnosis not present

## 2020-02-15 DIAGNOSIS — I519 Heart disease, unspecified: Secondary | ICD-10-CM | POA: Diagnosis not present

## 2020-02-15 DIAGNOSIS — E871 Hypo-osmolality and hyponatremia: Secondary | ICD-10-CM | POA: Diagnosis not present

## 2020-02-15 DIAGNOSIS — I5041 Acute combined systolic (congestive) and diastolic (congestive) heart failure: Secondary | ICD-10-CM | POA: Diagnosis not present

## 2020-02-15 DIAGNOSIS — E1165 Type 2 diabetes mellitus with hyperglycemia: Secondary | ICD-10-CM

## 2020-02-15 LAB — CREATININE, URINE, RANDOM: Creatinine, Urine: 46 mg/dL

## 2020-02-15 LAB — SODIUM
Sodium: 122 mmol/L — ABNORMAL LOW (ref 135–145)
Sodium: 122 mmol/L — ABNORMAL LOW (ref 135–145)
Sodium: 122 mmol/L — ABNORMAL LOW (ref 135–145)

## 2020-02-15 LAB — CBC WITH DIFFERENTIAL/PLATELET
Abs Immature Granulocytes: 0.06 10*3/uL (ref 0.00–0.07)
Basophils Absolute: 0 10*3/uL (ref 0.0–0.1)
Basophils Relative: 0 %
Eosinophils Absolute: 0 10*3/uL (ref 0.0–0.5)
Eosinophils Relative: 0 %
HCT: 37.7 % — ABNORMAL LOW (ref 39.0–52.0)
Hemoglobin: 13.7 g/dL (ref 13.0–17.0)
Immature Granulocytes: 1 %
Lymphocytes Relative: 5 %
Lymphs Abs: 0.4 10*3/uL — ABNORMAL LOW (ref 0.7–4.0)
MCH: 30.2 pg (ref 26.0–34.0)
MCHC: 36.3 g/dL — ABNORMAL HIGH (ref 30.0–36.0)
MCV: 83.2 fL (ref 80.0–100.0)
Monocytes Absolute: 0.3 10*3/uL (ref 0.1–1.0)
Monocytes Relative: 4 %
Neutro Abs: 6.7 10*3/uL (ref 1.7–7.7)
Neutrophils Relative %: 90 %
Platelets: 113 10*3/uL — ABNORMAL LOW (ref 150–400)
RBC: 4.53 MIL/uL (ref 4.22–5.81)
RDW: 12.9 % (ref 11.5–15.5)
WBC: 7.5 10*3/uL (ref 4.0–10.5)
nRBC: 0 % (ref 0.0–0.2)

## 2020-02-15 LAB — COMPREHENSIVE METABOLIC PANEL
ALT: 37 U/L (ref 0–44)
AST: 75 U/L — ABNORMAL HIGH (ref 15–41)
Albumin: 2.9 g/dL — ABNORMAL LOW (ref 3.5–5.0)
Alkaline Phosphatase: 37 U/L — ABNORMAL LOW (ref 38–126)
Anion gap: 11 (ref 5–15)
BUN: 57 mg/dL — ABNORMAL HIGH (ref 8–23)
CO2: 17 mmol/L — ABNORMAL LOW (ref 22–32)
Calcium: 7.6 mg/dL — ABNORMAL LOW (ref 8.9–10.3)
Chloride: 94 mmol/L — ABNORMAL LOW (ref 98–111)
Creatinine, Ser: 1.85 mg/dL — ABNORMAL HIGH (ref 0.61–1.24)
GFR, Estimated: 38 mL/min — ABNORMAL LOW (ref >60)
Glucose, Bld: 201 mg/dL — ABNORMAL HIGH (ref 70–99)
Potassium: 4.4 mmol/L (ref 3.5–5.1)
Sodium: 122 mmol/L — ABNORMAL LOW (ref 135–145)
Total Bilirubin: 0.9 mg/dL (ref 0.3–1.2)
Total Protein: 5.5 g/dL — ABNORMAL LOW (ref 6.5–8.1)

## 2020-02-15 LAB — URINALYSIS, ROUTINE W REFLEX MICROSCOPIC
Bacteria, UA: NONE SEEN
Bilirubin Urine: NEGATIVE
Glucose, UA: NEGATIVE mg/dL
Ketones, ur: NEGATIVE mg/dL
Leukocytes,Ua: NEGATIVE
Nitrite: NEGATIVE
Protein, ur: 100 mg/dL — AB
Specific Gravity, Urine: 1.011 (ref 1.005–1.030)
pH: 5 (ref 5.0–8.0)

## 2020-02-15 LAB — OSMOLALITY: Osmolality: 283 mOsm/kg (ref 275–295)

## 2020-02-15 LAB — C-REACTIVE PROTEIN: CRP: 3.1 mg/dL — ABNORMAL HIGH (ref <1.0)

## 2020-02-15 LAB — FERRITIN: Ferritin: 251 ng/mL (ref 24–336)

## 2020-02-15 LAB — GLUCOSE, CAPILLARY
Glucose-Capillary: 212 mg/dL — ABNORMAL HIGH (ref 70–99)
Glucose-Capillary: 119 mg/dL — ABNORMAL HIGH (ref 70–99)
Glucose-Capillary: 228 mg/dL — ABNORMAL HIGH (ref 70–99)
Glucose-Capillary: 226 mg/dL — ABNORMAL HIGH (ref 70–99)
Glucose-Capillary: 234 mg/dL — ABNORMAL HIGH (ref 70–99)
Glucose-Capillary: 169 mg/dL — ABNORMAL HIGH (ref 70–99)

## 2020-02-15 LAB — OSMOLALITY, URINE: Osmolality, Ur: 397 mOsm/kg (ref 300–900)

## 2020-02-15 LAB — MAGNESIUM: Magnesium: 2.4 mg/dL (ref 1.7–2.4)

## 2020-02-15 LAB — D-DIMER, QUANTITATIVE: D-Dimer, Quant: 0.59 ug/mL-FEU — ABNORMAL HIGH (ref 0.00–0.50)

## 2020-02-15 LAB — PHOSPHORUS: Phosphorus: 4.4 mg/dL (ref 2.5–4.6)

## 2020-02-15 LAB — SODIUM, URINE, RANDOM: Sodium, Ur: 56 mmol/L

## 2020-02-15 MED ORDER — FUROSEMIDE 10 MG/ML IJ SOLN
40.0000 mg | Freq: Once | INTRAMUSCULAR | Status: DC
  Filled 2020-02-15: qty 4

## 2020-02-15 MED ORDER — FUROSEMIDE 10 MG/ML IJ SOLN
40.0000 mg | Freq: Two times a day (BID) | INTRAMUSCULAR | Status: DC
  Administered 2020-02-15: 40 mg via INTRAVENOUS

## 2020-02-15 MED ORDER — FUROSEMIDE 10 MG/ML IJ SOLN
40.0000 mg | Freq: Two times a day (BID) | INTRAMUSCULAR | Status: AC
  Administered 2020-02-15: 40 mg via INTRAVENOUS
  Filled 2020-02-15: qty 4

## 2020-02-15 NOTE — Progress Notes (Signed)
Phlebotomy collecting blood sample for sodium level orders at this current time.

## 2020-02-15 NOTE — Progress Notes (Addendum)
Progress Note  Due to the COVID-19 pandemic, this visit was completed with telemedicine (audio/video) technology to reduce patient and provider exposure as well as to preserve personal protective equipment.   Patient Name: Daniel Myers Date of Encounter: 02/15/2020  Primary Cardiologist: Rozann Lesches, MD   Subjective   Talked with the patient on the phone this morning. Reports he had a rough night, worsening shortness of breath. Requiring more O2 this morning. No chest pain. Feels swollen, weight is up.   Inpatient Medications    Scheduled Meds: . aerochamber Z-Stat Plus/medium  1 each Other Once  . amLODipine  10 mg Oral Daily  . vitamin C  500 mg Oral Daily  . aspirin EC  81 mg Oral Daily  . atorvastatin  80 mg Oral Daily  . furosemide  40 mg Intravenous Once  . insulin aspart  0-20 Units Subcutaneous Q4H  . insulin detemir  0.075 Units/kg Subcutaneous BID  . linagliptin  5 mg Oral Daily  . metoprolol tartrate  25 mg Oral BID  . mycophenolate  500 mg Oral BID  . pantoprazole  40 mg Oral Daily  . predniSONE  50 mg Oral Daily  . tacrolimus  1 mg Oral BID  . zinc sulfate  220 mg Oral Daily   Continuous Infusions: . remdesivir 100 mg in NS 100 mL 100 mg (02/14/20 0919)   PRN Meds: acetaminophen, albuterol, chlorpheniramine-HYDROcodone, guaiFENesin-dextromethorphan, ondansetron **OR** ondansetron (ZOFRAN) IV   Vital Signs    Vitals:   02/15/20 0100 02/15/20 0537 02/15/20 0756 02/15/20 0945  BP:  (!) 146/74 (!) 152/74   Pulse:  74 76   Resp:  18 16   Temp:  97.9 F (36.6 C)    TempSrc:  Oral    SpO2:  96% 95% 90%  Weight: 100.8 kg     Height:        Intake/Output Summary (Last 24 hours) at 02/15/2020 1001 Last data filed at 02/15/2020 0441 Gross per 24 hour  Intake 2287.85 ml  Output 1075 ml  Net 1212.85 ml   Last 3 Weights 02/15/2020 02/12/2020 02/11/2020  Weight (lbs) 222 lb 4.8 oz 212 lb 4.9 oz 208 lb  Weight (kg) 100.835 kg 96.3 kg 94.348 kg      Telemetry     SR, 5 beats of NSVT - Personally Reviewed  ECG    No new tracing this morning  Physical Exam   VITAL SIGNS:  reviewed  Labs    Chemistry Recent Labs  Lab 02/13/20 0145 02/14/20 0405 02/15/20 0203  NA 130* 125* 122*  K 4.8 4.5 4.4  CL 99 95* 94*  CO2 21* 19* 17*  GLUCOSE 123* 144* 201*  BUN 40* 51* 57*  CREATININE 1.84* 1.87* 1.85*  CALCIUM 8.1* 7.9* 7.6*  PROT 5.5* 6.3* 5.5*  ALBUMIN 2.9* 3.4* 2.9*  AST 66* 90* 75*  ALT 24 36 37  ALKPHOS 39 44 37*  BILITOT 1.0 0.9 0.9  GFRNONAA 38* 37* 38*  ANIONGAP 10 11 11      Hematology Recent Labs  Lab 02/13/20 0145 02/14/20 0405 02/15/20 0203  WBC 5.6 14.3* 7.5  RBC 4.71 5.14 4.53  HGB 13.6 14.6 13.7  HCT 39.8 43.7 37.7*  MCV 84.5 85.0 83.2  MCH 28.9 28.4 30.2  MCHC 34.2 33.4 36.3*  RDW 12.3 12.8 12.9  PLT 85* 148* 113*    Cardiac EnzymesNo results for input(s): TROPONINI in the last 168 hours. No results for input(s): TROPIPOC in the last  168 hours.   BNP Recent Labs  Lab 02/11/20 2330  BNP 842.0*     DDimer  Recent Labs  Lab 02/13/20 0145 02/14/20 0405 02/15/20 0203  DDIMER 0.66* 0.40 0.59*     Radiology    No results found.  Cardiac Studies   Echo: 02/12/20  IMPRESSIONS    1. Left ventricular ejection fraction, by estimation, is 40 to 45%. The  left ventricle has mildly decreased function. The left ventricle  demonstrates regional wall motion abnormalities (see scoring  diagram/findings for description). The left ventricular  internal cavity size was mildly dilated. There is mild left ventricular  hypertrophy. Left ventricular diastolic parameters are suggestive of Grade  III diastolic dysfunction (restrictive).  2. Right ventricular systolic function is normal. The right ventricular  size is normal. Tricuspid regurgitation signal is inadequate for assessing  PA pressure.  3. Left atrial size was severely dilated.  4. The mitral valve is grossly normal, mildly calcified.  Trivial mitral  valve regurgitation.  5. The aortic valve is tricuspid. Aortic valve regurgitation is not  visualized.  6. The inferior vena cava is dilated in size with >50% respiratory  variability, suggesting right atrial pressure of 8 mmHg.   FINDINGS  Left Ventricle: Left ventricular ejection fraction, by estimation, is 40  to 45%. The left ventricle has mildly decreased function. The left  ventricle demonstrates regional wall motion abnormalities. The left  ventricular internal cavity size was mildly  dilated. There is mild left ventricular hypertrophy. Left ventricular  diastolic parameters are consistent with Grade III diastolic dysfunction  (restrictive).     LV Wall Scoring:  The basal inferolateral segment, apical septal segment, basal inferior  segment, and apex are akinetic. The antero-lateral wall, mid and distal  lateral wall, mid and distal inferior wall, mid anteroseptal segment, and  mid  inferoseptal segment are hypokinetic. The entire anterior wall, basal  anteroseptal segment, and basal inferoseptal segment are normal.   Right Ventricle: The right ventricular size is normal. No increase in  right ventricular wall thickness. Right ventricular systolic function is  normal. Tricuspid regurgitation signal is inadequate for assessing PA  pressure.   Left Atrium: Left atrial size was severely dilated.   Right Atrium: Right atrial size was normal in size.   Pericardium: There is no evidence of pericardial effusion. Presence of  pericardial fat pad.   Mitral Valve: The mitral valve is grossly normal. There is mild  calcification of the mitral valve leaflet(s). Trivial mitral valve  regurgitation.   Tricuspid Valve: The tricuspid valve is grossly normal. Tricuspid valve  regurgitation is trivial.   Aortic Valve: The aortic valve is tricuspid. There is mild aortic valve  annular calcification. Aortic valve regurgitation is not visualized.   Pulmonic  Valve: The pulmonic valve was not well visualized. Pulmonic valve  regurgitation is trivial.   Aorta: The aortic root is normal in size and structure.   Venous: The inferior vena cava is dilated in size with greater than 50%  respiratory variability, suggesting right atrial pressure of 8 mmHg.   IAS/Shunts: No atrial level shunt detected by color flow Doppler.   Patient Profile     75 y.o. male with a hx of HTN, HLD, DM2,obesity,and CKD with history of renal transplantation on immunosuppressive therapywho was seen  for the evaluation of chest pain and elevated troponinsat the request ofDr. Manuella Ghazi by Dr. Domenic Polite and transferred to [facility] for further management.   Assessment & Plan    1. NSTEMI with  abnormal EKG: hsTn peaked at 8490 in the setting of COVID-19 infection. No reported chest pain. Echo with EF of 40-45% with basal, inferolateral and apex are akinetic. Has been treated medically with IV heparin (plan for 48 hours), ASA, statin and BB therapy. Will need ischemic work up once recovered.   2. Acute combined systolic and diastolic HF: EF noted at 82-80% on echo. Low dose metoprolol 25mg  BID. Unable to add ACEi/ARB with renal dysfunction. Weight is up and he is net+. Has been ordered for IV lasix 40mg  x1, will further increase to 40mg  BID.   3. Acute respiratory failure in the setting of COVID: remains on [place] @2L . Treated with remdesivir and steroids per primary.  4. HTN: stable with the addition of metoprolol.   5. DM: Hgb A1c 6.9 -- on SSI, Levemir  6. HLD: will switch from pravastatin to high dose statin. Monitor LFTs as they have been slightly elevated this admission. -- last lipids (11/21)- LDL 125, HDL 34  7. Hx of right renal transplant: Cr baseline around 1.4-1.5. Has been steady at 1.8 this admission.  -- on prograf  8. Hyponatremia: Na+ 129>>130>>125>>122 in the setting of COVID, volume overload and hx of renal transplant.  -- has been ordered for IV lasix 40mg   x1. Will increase to 40mg  BID. Primary has consulted nephrology for further assistance given his renal hx.   For questions or updates, please contact [place] Please consult [email or web address] for contact info under        Signed, Reino Bellis, NP  02/15/2020, 10:01 AM     Agree with note by Reino Bellis NP-C  Mr. Rosselli is more short of breath this morning.  His weight has gone up 14 pounds.  He is no longer on IV heparin.  He denies chest pain.  His EF is mild to moderately reduced at 40 to 45%.  Serum creatinine is in the 1.8 range.  He has gotten remdesivir as well as Solu-Medrol and prednisone.  Serum sodium has fallen to 122.  I believe he needs more aggressive diuresis.  I have spoken to Dr. Sarajane Jews who is asked the renal service to assist which I completely agree with.  No further recommendations at this time.  He will need functional testing once he is recovered from his Covid illness.  Lorretta Harp, M.D., [place], [facility], [place] [address] [place], [place]  [address]  [phone] 02/15/2020 10:34 AM

## 2020-02-15 NOTE — Progress Notes (Signed)
SATURATION QUALIFICATIONS: (This note is used to comply with regulatory documentation for home oxygen)  Patient Saturations on Room Air at Rest = 90%  Patient Saturations on Room Air while Ambulating = 80%  Patient Saturations on 5 Liters of oxygen while Ambulating = 90%  Please briefly explain why patient needs home oxygen:pt unable to ambulate more than couple steps without oxygen saturation dropping rapidly. Pt requires 5L [place] at this time to bring oxygen saturation up to 90% highest.

## 2020-02-15 NOTE — Progress Notes (Signed)
PROGRESS NOTE  Daniel Myers GBT:[id number] DOB: 26-Sep-1945 DOA: 02/11/2020 PCP: Claretta Fraise, MD  Brief History   75 year old man PMH including diabetes mellitus type 2, right kidney transplant, presented with shortness of breath and chest pain.  Admitted for acute hypoxic respiratory failure secondary to COVID-19 pneumonia, chest pain evaluation.  Found to have NSTEMI and transferred to [facility] for further evaluation.  Seen by cardiology, treated conservatively with medical therapy.  Mild Covid pneumonia responded well to remdesivir and steroids.  Developed mild volume overload and significant hyponatremia of unclear etiology.  Nephrology consulted.  NSTEMI --Asymptomatic. --2D echocardiogram showed mild LV dysfunction with segmental wall motion abnormalities --Cardiology recommended medical management in view of of renal insufficiency and history of renal transplant.  Ischemic work-up planned once patient has recovered. --Continue beta-blocker, aspirin.    Acute hyponatremia, asymptomatic, possibly multifactorial including Covid, volume overload, status post renal transplant --Did not respond to fluids.  Lasix.  Nephrology consultation appreciated. --Repeat urine study in the morning. --per Dr. Joylene Grapes, if worsens overnight to less than 120, administer hypertonic saline 50 cc an hour.  Acute combined systolic and diastolic CHF --Minimally positive intake/outtake.  Doubt 14 pound weight gain.  No lower extremity edema but does have some abdominal wall edema.  Also on steroids.  --Lasix as per cardiology -- continue beta-blocker, unable to add ACE inhibitor with renal dysfunction.    Acute hypoxic respiratory failure secondary to COVID-19 pneumonia and acute CHF. --More short of breath today and increased oxygen requirement, however this is secondary to CHF rather than Covid.  Inflammatory markers trending down.  Wean as tolerated.  Likely will need home oxygen --PT evaluation noted, no  follow-up needed. --Continue remdesivir and steroids.  --CXR no acute disease on admission --oxygen 2L > 5L (CHF) --inflammatory markers . Ferritin 117 > 99 > 269 > 251 . CRP 2.4 > 7.2 > 4.0 > 3.1 . Ddimer .68 > .66 > .40 > .59 --Tx . Remdesivir  . Steroids . Actemra/baricitinab not indicated . Prone as tolerated . Wean oxygen as tolerated  Thrombocytopenia --Appears to be chronic w/ acute component, was seen 5/21, 8/21, 11/21 --Variable, follow clinically  Elevated AST --Likely secondary to Covid.  Decreased today, monitor.  Status post renal transplant --Appears stable.  Continue immunosuppressants  Diabetes mellitus type 2, A1c 6.9 --CBG remains stable, fasting blood sugar 119, continue Levemir and linagliptin  AKI superimposed on CKD stage IIIa --Likely secondary to Covid.  Stable.  Diuresis as above.  Nephrology appreciated.   Disposition Plan:  Discussion: Somewhat worse today, likely secondary to acute CHF.  Hyponatremia worse.  Nephrology involved.  Plan diuresis.  Continue serial monitoring of hyponatremia.  Plan as above.  Status is: Inpatient  Remains inpatient appropriate because:IV treatments appropriate due to intensity of illness or inability to take PO and Inpatient level of care appropriate due to severity of illness   Dispo: The patient is from: Home              Anticipated d/c is to: Home              Anticipated d/c date is: 3 days              Patient currently is not medically stable to d/c.  DVT prophylaxis: SCDs Start: 02/12/20 0540 SCDs Start: 02/12/20 0346   Code Status: Full Code Family Communication: Daughter at bedside  Murray Hodgkins, MD  [facility] Direct contact: see [email or web address] (further directions at bottom of  note if needed) 7PM-7AM contact night coverage as at bottom of note 02/15/2020, 5:52 PM  LOS: 3 days     Consults:  . Cardiology    . Nephrology   Interval History/Subjective  CC: f/u NSTEMI  Reports some  swelling, short of breath now. RN reports oxygen requirement up to 5L.  Objective   Vitals:  Vitals:   02/15/20 1155 02/15/20 1608  BP: (!) 153/63 136/68  Pulse: 73 66  Resp: (!) 21 18  Temp: 97.9 F (36.6 C) 97.9 F (36.6 C)  SpO2: 98% 93%    Exam:  Constitutional:   Appears calm, ill, uncomfortable, nontoxic ENMT:  . grossly normal hearing  Respiratory:  . CTA bilaterally, no w/r/r.  . Respiratory effort mild increased Cardiovascular:  . RRR, no m/r/g . No LE extremity edema   Abdomen:  . Abdomen appears edematous Psychiatric:  . Mental status o Mood, affect appropriate  I have personally reviewed the following:   Today's Data  . I/O +321 . Wgts unreliable, doubt 14lb weight gain . CBG stable . Na+ down to 122, chloride 94, creatinine stable at 1.85 . AST down to 75 . Inflammatory markers trending down . Serum osmolality 283 . Platelets lower at 113  Scheduled Meds: . aerochamber Z-Stat Plus/medium  1 each Other Once  . amLODipine  10 mg Oral Daily  . vitamin C  500 mg Oral Daily  . aspirin EC  81 mg Oral Daily  . atorvastatin  80 mg Oral Daily  . insulin aspart  0-20 Units Subcutaneous Q4H  . insulin detemir  0.075 Units/kg Subcutaneous BID  . linagliptin  5 mg Oral Daily  . metoprolol tartrate  25 mg Oral BID  . mycophenolate  500 mg Oral BID  . pantoprazole  40 mg Oral Daily  . predniSONE  50 mg Oral Daily  . tacrolimus  1 mg Oral BID  . zinc sulfate  220 mg Oral Daily   Continuous Infusions: . remdesivir 100 mg in NS 100 mL 100 mg (02/15/20 1001)    Principal Problem:   NSTEMI (non-ST elevated myocardial infarction) ([place]) Active Problems:   Essential hypertension   Insulin dependent type 2 diabetes mellitus ([place])   Renal transplant recipient   Mixed hyperlipidemia   Hyponatremia   Pneumonia due to COVID-19 virus   Hyperglycemia due to diabetes mellitus ([place])   Acute respiratory failure with hypoxia (H[facility])   CKD (chronic kidney  disease), stage III (H[facility])   Systolic and diastolic CHF, acute ([place])   Thrombocytopenia ([place])   AKI (acute kidney injury) ([place])   LOS: 3 days   How to contact the [facility] Attending or Consulting provider [place] or covering provider during after hours 7P -7A, for this patient?  1. Check the care team in [facility] and look for a) attending/consulting [facility] provider listed and b) the [facility] team listed 2. Log into [email or web address] and use [place]'s universal password to access. If you do not have the password, please contact the hospital operator. 3. Locate the [facility] provider you are looking for under [facility] and page to a number that you can be directly reached. 4. If you still have difficulty reaching the provider, please page the [facility] (Director on Call) for the Hospitalists listed on amion for assistance.

## 2020-02-15 NOTE — Progress Notes (Signed)
Urine Na 56 which doesn't clear up much but this was after lasix. Hold lasix dose tomorrow morning and repeat test in AM. Can reorder lasix after urine obtained tomorrow.

## 2020-02-15 NOTE — Consult Note (Signed)
Nephrology Consult   Requesting provider: [facility] Service requesting consult: Hospitalist Reason for consult: AKI, renal transplant, hyponatremia   Assessment/Recommendations: Daniel Myers is a/an 75 y.o. male with a past medical history  HTN, HLD, DM two, obesity, CKD, renal transplant (2008)  who present w/ COVID, NSTEMI, hyponatremia  Hyponatremia: moderately acute with no symptoms. Na 130 on arrival now decreased to 122 since 1/5 so can correct more quickly than with chronic hyponatremia. Need urine lytes to better determine cause. Worsened with fluids so if Na low this would suggest CHF exacerbation as cause and if high more likely SIADH. However, lasix administered before urine studies obtained so this may falsify data and we may need to try tomorrow.  -F/u urine studies -On iv lasix 40mg  BID -Consider tolvaptan vs hypertonic saline if Na continues to drop -Could correct to 130 in 24 hours given acute change -Serum sodium q6hr today  Non-Oliguric AKI on CKD w/ h/o Renal transplant: BL Crt 1.5, h/o tx in 2008 w/ biopsy in November w/o rejection (reportedly proteinuria that has since resolved). Mild AKI at this time, unclear cause but likely associated w/ COVID. CTM -Diuresis as below -Continue immunosuppression as ordered  -Continue to monitor daily Cr, Dose meds for GFR -Monitor Daily I/Os, Daily weight  -Maintain MAP>65 for optimal renal perfusion.  -Avoid nephrotoxic medications including NSAIDs and Vanc/Zosyn combo  CHF exacerbation: appears mild to moderately overloaded today. Weight is up since admission. Some concern for CHF as above also with worsening hypoxia. -Agree with IV lasix; on 40mg  IV BID -May need to hold tomorrow morning dose and obtain urine lytes before given; will monitor -Cardiology following; appreicate help  NSTEMI: w/ slight decrease in EF. Treated medically. Plans for eval in the future once patient stabilizes. Cardiology following  HTN: cont  norvasc and metop, lasix as above  COVID: treatment per priamry; worsening O2 status could be from pulmonary edema  Dispo: remain inpatient given hyponatremia  Recommendations conveyed to primary service.    [facility] 02/15/2020 10:39 AM   _____________________________________________________________________________________ CC: Hyponatremia, AKI, renal transplant  History of Present Illness: Daniel Myers is a/an 75 y.o. male with a past medical history of HTN, HLD, DM two, obesity, CKD, renal transplant (2008) who presents with COVID c/f hyponatremia and h/o renal transplant.  Patient initially presented on 02/12/2020 for worsening shortness of breath.  He was found to be Covid positive and was admitted to the hospital for further evaluation.  He also had an NSTEMI at that time.  His Covid was felt to be mild but he has been undergoing treatment with multiple medications per protocol.  His troponin peaked at 8490 and he was treated with IV heparin, aspirin, beta-blocker with plans to perform ischemic work-up in the future once he has improved.  Patient states he feels okay today but noticed that he has gained weight, become more short of breath, developed swelling in his legs.  Lasix has been added to his regimen.  He denies confusion, nausea, vomiting.  His appetite is good.  He wants to go home today.  Regards to his kidney transplant he has been compliant with his antirejection medications anemia is around 1.5.  His creatinine was 1.5 on arrival and increased to 1.8 today after admission and has been stable there ever since.  His immunosuppression has been continued inpatient including tacrolimus, CellCept, prednisone.  He follows at [facility].  Sodium at baseline.  Sodium was 129 on arrival.  He was treated with IV fluids and his sodium decreased to 125 yesterday and now 122.     Medications:  Current Facility-Administered Medications  Medication  Dose Route Frequency Provider Last Rate Last Admin  . acetaminophen (TYLENOL) tablet 650 mg  650 mg Oral Q6H PRN Adefeso, Oladapo, DO      . aerochamber Z-Stat Plus/medium 1 each  1 each Other Once Adefeso, Oladapo, DO      . albuterol (VENTOLIN HFA) 108 (90 Base) MCG/ACT inhaler 2 puff  2 puff Inhalation Q6H PRN Manuella Ghazi, Pratik D, DO      . amLODipine (NORVASC) tablet 10 mg  10 mg Oral Daily Adefeso, Oladapo, DO   10 mg at 02/15/20 1010  . ascorbic acid (VITAMIN C) tablet 500 mg  500 mg Oral Daily Adefeso, Oladapo, DO   500 mg at 02/15/20 1011  . aspirin EC tablet 81 mg  81 mg Oral Daily Satira Sark, MD   81 mg at 02/15/20 1010  . atorvastatin (LIPITOR) tablet 80 mg  80 mg Oral Daily Reino Bellis B, NP   80 mg at 02/15/20 1010  . chlorpheniramine-HYDROcodone (TUSSIONEX) 10-8 MG/5ML suspension 5 mL  5 mL Oral Q12H PRN Adefeso, Oladapo, DO   5 mL at 02/15/20 1013  . furosemide (LASIX) injection 40 mg  40 mg Intravenous BID Lorretta Harp, MD   40 mg at 02/15/20 1004  . guaiFENesin-dextromethorphan (ROBITUSSIN DM) 100-10 MG/5ML syrup 10 mL  10 mL Oral Q4H PRN Adefeso, Oladapo, DO   10 mL at 02/15/20 1010  . insulin aspart (novoLOG) injection 0-20 Units  0-20 Units Subcutaneous Q4H Shah, Pratik D, DO   7 Units at 02/15/20 1002  . insulin detemir (LEVEMIR) injection 7 Units  0.075 Units/kg Subcutaneous BID Adefeso, Oladapo, DO   7 Units at 02/15/20 1007  . linagliptin (TRADJENTA) tablet 5 mg  5 mg Oral Daily Adefeso, Oladapo, DO   5 mg at 02/15/20 1010  . metoprolol tartrate (LOPRESSOR) tablet 25 mg  25 mg Oral BID Imogene Burn, PA-C   25 mg at 02/15/20 1010  . mycophenolate (CELLCEPT) capsule 500 mg  500 mg Oral BID Opyd, Ilene Qua, MD   500 mg at 02/15/20 1011  . ondansetron (ZOFRAN) tablet 4 mg  4 mg Oral Q6H PRN Adefeso, Oladapo, DO       Or  . ondansetron (ZOFRAN) injection 4 mg  4 mg Intravenous Q6H PRN Adefeso, Oladapo, DO      . pantoprazole (PROTONIX) EC tablet 40 mg  40 mg  Oral Daily Adefeso, Oladapo, DO   40 mg at 02/15/20 1010  . predniSONE (DELTASONE) tablet 50 mg  50 mg Oral Daily Adefeso, Oladapo, DO   50 mg at 02/15/20 1010  . remdesivir 100 mg in sodium chloride 0.9 % 100 mL IVPB  100 mg Intravenous Daily Erenest Blank, RPH 200 mL/hr at 02/15/20 1001 100 mg at 02/15/20 1001  . tacrolimus (PROGRAF) capsule 1 mg  1 mg Oral BID Opyd, Ilene Qua, MD   1 mg at 02/15/20 1010  . zinc sulfate capsule 220 mg  220 mg Oral Daily Adefeso, Oladapo, DO   220 mg at 02/15/20 1010     ALLERGIES Other, Tape, and Sulfur  MEDICAL HISTORY Past Medical History:  Diagnosis Date  . Basal cell carcinoma 06/27/2013   nodular on left jawline - excision  . Basal cell carcinoma 07/01/2014   nodular on left hawling - CX3+5FU+excision  . Basal cell carcinoma  10/10/2014   nodular on right forearm, middle - tx p bx  . Basal cell carcinoma 02/11/2015   left neck - CX3 + excision  . Basal cell carcinoma 06/14/2016   left jawline - CX3+5FU  . Basal cell carcinoma 02/22/2017   superficial and nodular on left neck - excision  . Basal cell carcinoma 04/11/2018   superficial and nodular on right inferior forearm - CX3+Cautery+5FU  . Chronic kidney disease   . History of renal transplant   . Hyperlipidemia   . Hypertension   . 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 Squamous cell carcinoma of skin 11/08/2011   right upper forearm - tx p bx  . Squamous cell carcinoma of skin 11/08/2011   left upper forearm - tx p bx  . Squamous cell carcinoma of skin 11/08/2011   left hand - tx p bx  . 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/27/2013   in situ on left lower back - CX3+5FU  . 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/27/2013   in situ on left forehead - CX3+5FU  . Squamous cell  carcinoma of skin 06/27/2013   in situ on right temple - watch per ST  . 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/27/2013   in situ on right forearm - CX3+5FU  . Squamous cell carcinoma of skin 07/01/2014   well differentiated on right sideburn - CX3+5FU  . Squamous cell carcinoma of skin 07/01/2014   in situ on left shoulder - CX3+5FU  . Squamous cell carcinoma of skin 07/01/2014   in situ on right forearm, proximal - CX3+5FU+Cautery  . Squamous cell carcinoma of skin 07/01/2014   in situ on right forearm, distal - CX3+5FU  . Squamous cell carcinoma of skin 09/30/2015   in situ on posterior left ear - CX3+5FU  . Squamous cell carcinoma of skin 02/22/2017   in situ on right upper arm - CX3+5FU  . Squamous cell carcinoma of skin 02/22/2017   in situ on left upper arm - CX3+5FU  . Squamous cell carcinoma of skin 04/11/2018   in situ on right temple - CX3+5FU  . Squamous cell carcinoma of skin 04/11/2018   in situ on lateral right arm - MOHs  . Squamous cell carcinoma of skin 04/11/2018   in situ on right upper arm - MOHs  . Squamous cell carcinoma of skin 04/11/2018   in situ on left sideburn  . Squamous cell carcinoma of skin 04/11/2018   in situ on right flank - tx p bx  . Squamous cell carcinoma of skin 09/28/2018   in situ on right inner ear - tx p bx  . Squamous cell carcinoma of skin 09/28/2018   in situ on left inner ear - tx p bx  . Squamous cell carcinoma of skin 09/28/2018   in situ on right arm - tx p bx  . Squamous cell carcinoma of skin 03/21/2019   in situ on right antihelix ([place]kov treated topically)  . Squamous cell carcinoma of skin 03/21/2019   in situ on right neck - CX3+5FU  . Squamous cell carcinoma of skin 03/21/2019   in situ on left outer eye, inf (MOHs done 05/23/2019)  . Type 2 diabetes mellitus ([place])  SOCIAL HISTORY Social History   Socioeconomic History  . Marital status: Married    Spouse name: Not on file  . Number of children: Not on file   . Years of education: Not on file  . Highest education level: Not on file  Occupational History  . Not on file  Tobacco Use  . Smoking status: Current Some Day Smoker    Types: Pipe  . Smokeless tobacco: Never Used  Vaping Use  . Vaping Use: Never used  Substance and Sexual Activity  . Alcohol use: Never  . Drug use: Never  . Sexual activity: Not on file  Other Topics Concern  . Not on file  Social History Narrative  . Not on file   Social Determinants of Health   Financial Resource Strain: Not on file  Food Insecurity: Not on file  Transportation Needs: Not on file  Physical Activity: Not on file  Stress: Not on file  Social Connections: Not on file  Intimate Partner Violence: Not on file     FAMILY HISTORY Family History  Problem Relation Age of Onset  . Clotting disorder Mother   . Hypertension Sister   . Diabetes Sister       Review of Systems: 12 systems reviewed Otherwise as per HPI, all other systems reviewed and negative  Physical Exam: Vitals:   02/15/20 0945 02/15/20 1003  BP:  (!) 156/78  Pulse:  85  Resp:    Temp:    SpO2: 90% 90%   Total I/O In: 360 [P.O.:360] Out: 500 [Urine:500]  Intake/Output Summary (Last 24 hours) at 02/15/2020 1039 Last data filed at 02/15/2020 0900 Gross per 24 hour  Intake 2647.85 ml  Output 1575 ml  Net 1072.85 ml   General: well-appearing, no acute distress HEENT: anicteric sclera, oropharynx clear without lesions CV: normal rate, no audible murmur, trace edema in BLE Lungs: mild iwob, bilateral chest rise, coarse bilaterally Abd: soft, non-tender, mild distention Skin: no visible lesions or rashes Psych: alert, engaged, appropriate mood and affect Musculoskeletal: no obvious deformities Neuro: normal speech, no gross focal deficits   Test Results Reviewed Lab Results  Component Value Date   NA 122 (L) 02/15/2020   K 4.4 02/15/2020   CL 94 (L) 02/15/2020   CO2 17 (L) 02/15/2020   BUN 57 (H)  02/15/2020   CREATININE 1.85 (H) 02/15/2020   CALCIUM 7.6 (L) 02/15/2020   ALBUMIN 2.9 (L) 02/15/2020   PHOS 4.4 02/15/2020     I have reviewed all relevant outside healthcare records related to the patient's current hospitalization

## 2020-02-16 ENCOUNTER — Inpatient Hospital Stay (HOSPITAL_COMMUNITY): Payer: Medicare Other

## 2020-02-16 DIAGNOSIS — I5041 Acute combined systolic (congestive) and diastolic (congestive) heart failure: Secondary | ICD-10-CM | POA: Diagnosis not present

## 2020-02-16 DIAGNOSIS — I214 Non-ST elevation (NSTEMI) myocardial infarction: Secondary | ICD-10-CM | POA: Diagnosis not present

## 2020-02-16 DIAGNOSIS — J9601 Acute respiratory failure with hypoxia: Secondary | ICD-10-CM | POA: Diagnosis not present

## 2020-02-16 DIAGNOSIS — U071 COVID-19: Secondary | ICD-10-CM | POA: Diagnosis not present

## 2020-02-16 LAB — COMPREHENSIVE METABOLIC PANEL
ALT: 39 U/L (ref 0–44)
AST: 67 U/L — ABNORMAL HIGH (ref 15–41)
Albumin: 2.7 g/dL — ABNORMAL LOW (ref 3.5–5.0)
Alkaline Phosphatase: 34 U/L — ABNORMAL LOW (ref 38–126)
Anion gap: 9 (ref 5–15)
BUN: 58 mg/dL — ABNORMAL HIGH (ref 8–23)
CO2: 20 mmol/L — ABNORMAL LOW (ref 22–32)
Calcium: 7.5 mg/dL — ABNORMAL LOW (ref 8.9–10.3)
Chloride: 97 mmol/L — ABNORMAL LOW (ref 98–111)
Creatinine, Ser: 1.88 mg/dL — ABNORMAL HIGH (ref 0.61–1.24)
GFR, Estimated: 37 mL/min — ABNORMAL LOW (ref >60)
Glucose, Bld: 147 mg/dL — ABNORMAL HIGH (ref 70–99)
Potassium: 4.3 mmol/L (ref 3.5–5.1)
Sodium: 126 mmol/L — ABNORMAL LOW (ref 135–145)
Total Bilirubin: 0.7 mg/dL (ref 0.3–1.2)
Total Protein: 5.1 g/dL — ABNORMAL LOW (ref 6.5–8.1)

## 2020-02-16 LAB — CBC WITH DIFFERENTIAL/PLATELET
Abs Immature Granulocytes: 0.06 10*3/uL (ref 0.00–0.07)
Basophils Absolute: 0 10*3/uL (ref 0.0–0.1)
Basophils Relative: 0 %
Eosinophils Absolute: 0 10*3/uL (ref 0.0–0.5)
Eosinophils Relative: 0 %
HCT: 35.3 % — ABNORMAL LOW (ref 39.0–52.0)
Hemoglobin: 12.7 g/dL — ABNORMAL LOW (ref 13.0–17.0)
Immature Granulocytes: 1 %
Lymphocytes Relative: 10 %
Lymphs Abs: 0.6 10*3/uL — ABNORMAL LOW (ref 0.7–4.0)
MCH: 29.8 pg (ref 26.0–34.0)
MCHC: 36 g/dL (ref 30.0–36.0)
MCV: 82.9 fL (ref 80.0–100.0)
Monocytes Absolute: 0.5 10*3/uL (ref 0.1–1.0)
Monocytes Relative: 7 %
Neutro Abs: 5.3 10*3/uL (ref 1.7–7.7)
Neutrophils Relative %: 82 %
Platelets: 105 10*3/uL — ABNORMAL LOW (ref 150–400)
RBC: 4.26 MIL/uL (ref 4.22–5.81)
RDW: 12.8 % (ref 11.5–15.5)
WBC: 6.5 10*3/uL (ref 4.0–10.5)
nRBC: 0 % (ref 0.0–0.2)

## 2020-02-16 LAB — BASIC METABOLIC PANEL
Anion gap: 12 (ref 5–15)
BUN: 61 mg/dL — ABNORMAL HIGH (ref 8–23)
CO2: 20 mmol/L — ABNORMAL LOW (ref 22–32)
Calcium: 7.5 mg/dL — ABNORMAL LOW (ref 8.9–10.3)
Chloride: 94 mmol/L — ABNORMAL LOW (ref 98–111)
Creatinine, Ser: 1.96 mg/dL — ABNORMAL HIGH (ref 0.61–1.24)
GFR, Estimated: 35 mL/min — ABNORMAL LOW (ref >60)
Glucose, Bld: 144 mg/dL — ABNORMAL HIGH (ref 70–99)
Potassium: 4.1 mmol/L (ref 3.5–5.1)
Sodium: 126 mmol/L — ABNORMAL LOW (ref 135–145)

## 2020-02-16 LAB — GLUCOSE, CAPILLARY
Glucose-Capillary: 123 mg/dL — ABNORMAL HIGH (ref 70–99)
Glucose-Capillary: 155 mg/dL — ABNORMAL HIGH (ref 70–99)
Glucose-Capillary: 199 mg/dL — ABNORMAL HIGH (ref 70–99)
Glucose-Capillary: 209 mg/dL — ABNORMAL HIGH (ref 70–99)
Glucose-Capillary: 219 mg/dL — ABNORMAL HIGH (ref 70–99)
Glucose-Capillary: 269 mg/dL — ABNORMAL HIGH (ref 70–99)

## 2020-02-16 LAB — OSMOLALITY, URINE: Osmolality, Ur: 308 mOsm/kg (ref 300–900)

## 2020-02-16 LAB — C-REACTIVE PROTEIN: CRP: 2.5 mg/dL — ABNORMAL HIGH (ref <1.0)

## 2020-02-16 LAB — UREA NITROGEN, URINE: Urea Nitrogen, Ur: 577 mg/dL

## 2020-02-16 LAB — SODIUM, URINE, RANDOM: Sodium, Ur: 13 mmol/L

## 2020-02-16 LAB — FERRITIN: Ferritin: 213 ng/mL (ref 24–336)

## 2020-02-16 LAB — PHOSPHORUS: Phosphorus: 4.2 mg/dL (ref 2.5–4.6)

## 2020-02-16 LAB — MAGNESIUM: Magnesium: 2.7 mg/dL — ABNORMAL HIGH (ref 1.7–2.4)

## 2020-02-16 LAB — D-DIMER, QUANTITATIVE: D-Dimer, Quant: 0.63 ug/mL-FEU — ABNORMAL HIGH (ref 0.00–0.50)

## 2020-02-16 MED ORDER — LORAZEPAM 0.5 MG PO TABS: 0.5000 mg | ORAL_TABLET | Freq: Three times a day (TID) | ORAL | Status: DC | PRN

## 2020-02-16 MED ORDER — FUROSEMIDE 10 MG/ML IJ SOLN
80.0000 mg | Freq: Two times a day (BID) | INTRAMUSCULAR | Status: DC
  Administered 2020-02-16 – 2020-02-18 (×5): 80 mg via INTRAVENOUS
  Filled 2020-02-16 (×5): qty 8

## 2020-02-16 NOTE — Progress Notes (Signed)
Progress Note  Patient Name: Daniel Myers Date of Encounter: 02/16/2020  Primary Cardiologist: Rozann Lesches, MD   Subjective   Dyspnea improving. No chest pain.   Inpatient Medications    Scheduled Meds: . aerochamber Z-Stat Plus/medium  1 each Other Once  . amLODipine  10 mg Oral Daily  . vitamin C  500 mg Oral Daily  . aspirin EC  81 mg Oral Daily  . atorvastatin  80 mg Oral Daily  . furosemide  80 mg Intravenous BID  . insulin aspart  0-20 Units Subcutaneous Q4H  . insulin detemir  0.075 Units/kg Subcutaneous BID  . linagliptin  5 mg Oral Daily  . metoprolol tartrate  25 mg Oral BID  . mycophenolate  500 mg Oral BID  . pantoprazole  40 mg Oral Daily  . predniSONE  50 mg Oral Daily  . tacrolimus  1 mg Oral BID  . zinc sulfate  220 mg Oral Daily   Continuous Infusions:  PRN Meds: acetaminophen, albuterol, chlorpheniramine-HYDROcodone, guaiFENesin-dextromethorphan, LORazepam, ondansetron **OR** ondansetron (ZOFRAN) IV   Vital Signs    Vitals:   02/15/20 1608 02/15/20 2115 02/16/20 0000 02/16/20 0400  BP: 136/68 (!) 143/80 117/63 124/65  Pulse: 66 95  73  Resp: 18   20  Temp: 97.9 F (36.6 C) (!) 97.5 F (36.4 C)  97.9 F (36.6 C)  TempSrc: Oral Oral  Oral  SpO2: 93% 96% 95% 96%  Weight:    100.8 kg  Height:        Intake/Output Summary (Last 24 hours) at 02/16/2020 1135 Last data filed at 02/16/2020 0435 Gross per 24 hour  Intake 1300 ml  Output 2050 ml  Net -750 ml   Filed Weights   02/12/20 0415 02/15/20 0100 02/16/20 0400  Weight: 96.3 kg 100.8 kg 100.8 kg    Telemetry    NSR with PAC's - Personally Reviewed  ECG    none - Personally Reviewed  Physical Exam   GEN: No acute distress.   Neck: No JVD Cardiac: RRR, no murmurs, rubs, or gallops.  Respiratory: Clear to auscultation bilaterally with minimal rales, no wheezes. GI: Soft, nontender, non-distended  MS: No edema; No deformity. Neuro:  Nonfocal  Psych: Normal affect   Labs     Chemistry Recent Labs  Lab 02/14/20 0405 02/15/20 0203 02/15/20 1450 02/15/20 2003 02/16/20 0412  NA 125* 122*  122* 122* 122* 126*  K 4.5 4.4  --   --  4.3  CL 95* 94*  --   --  97*  CO2 19* 17*  --   --  20*  GLUCOSE 144* 201*  --   --  147*  BUN 51* 57*  --   --  58*  CREATININE 1.87* 1.85*  --   --  1.88*  CALCIUM 7.9* 7.6*  --   --  7.5*  PROT 6.3* 5.5*  --   --  5.1*  ALBUMIN 3.4* 2.9*  --   --  2.7*  AST 90* 75*  --   --  67*  ALT 36 37  --   --  39  ALKPHOS 44 37*  --   --  34*  BILITOT 0.9 0.9  --   --  0.7  GFRNONAA 37* 38*  --   --  37*  ANIONGAP 11 11  --   --  9     Hematology Recent Labs  Lab 02/14/20 0405 02/15/20 0203 02/16/20 0412  WBC 14.3* 7.5 6.5  RBC 5.14 4.53 4.26  HGB 14.6 13.7 12.7*  HCT 43.7 37.7* 35.3*  MCV 85.0 83.2 82.9  MCH 28.4 30.2 29.8  MCHC 33.4 36.3* 36.0  RDW 12.8 12.9 12.8  PLT 148* 113* 105*    Cardiac EnzymesNo results for input(s): TROPONINI in the last 168 hours. No results for input(s): TROPIPOC in the last 168 hours.   BNP Recent Labs  Lab 02/11/20 2330  BNP 842.0*     DDimer  Recent Labs  Lab 02/14/20 0405 02/15/20 0203 02/16/20 0412  DDIMER 0.40 0.59* 0.63*     Radiology    No results found.  Cardiac Studies   2D echo reviewed. EF - 40%, multiple wall motion  Patient Profile     75 y.o. male admitted with acute respiratory failure, Covid infection and NSTEMI.   Assessment & Plan    1. NSTEMI - he is pain free. I am not sure he will be a candidate for invasive eval with creatinine of 1.8. we will follow. Hopefully the renal function will improve. He is currently not a candidate for heart cath. 2. Acute on chronic renal failure - his creatinine is unchanged. We will follow. Note dephrology service on board.  3. Hyponatremia - he will be monitored and restrict free water. 4. Covid infection - he is being treated with Remdesivir and steroids. He will continue medical therapy. 5. Acute on chronic  systolic/diastolic heart failure - his weight is up and renal function is unchanged. He will continue iv lasix.     For questions or updates, please contact [place] Please consult [email or web address] for contact info under Cardiology/STEMI.      Signed, Cristopher Peru, MD  02/16/2020, 11:35 AM  Patient ID: Daniel Myers, male   DOB: 08/10/1945, 75 y.o.   MRN: [id number]

## 2020-02-16 NOTE — Progress Notes (Signed)
Nephrology Follow-Up Consult note   Assessment/Recommendations: Daniel Myers is a/an 75 y.o. male with a past medical history significant for HTN, HLD, DM two, obesity, CKD, renal transplant (2008)  who present w/ COVID, NSTEMI, hyponatremia  Hyponatremia: Ardelia Mems not accurate yesterday (obtained after lasix). Lytes this morning w/ urine na of 13 which is not c/w SIADH. Most likely related to CHF. Fortunately Na improved to 126 today - was -1L yesterday with IV lasix 40mg  BID -Continue diuretics; 80mg  IV BID today -Hopefully switch to orals tomorrow -Counseled to limit fluids today -F/u afternoon BMP  Non-Oliguric AKI on CKD w/ h/o Renal transplant: BL Crt 1.5, h/o tx in 2008 w/ biopsy in November w/o rejection (reportedly proteinuria that has since resolved). Mild AKI at this time, unclear cause but likely associated w/ COVID. CTM -Diuresis as above -Continue immunosuppression as ordered  -Continue to monitor daily Cr, Dose meds for GFR -Monitor Daily I/Os, Daily weight  -Maintain MAP>65 for optimal renal perfusion.  -Avoid nephrotoxic medications including NSAIDs and Vanc/Zosyn combo  CHF exacerbation: concern for volume overload. Weight about the same today.  -IV lasix as above; goal net negative today -Cardiology following; appreicate help  NSTEMI: w/ slight decrease in EF. Treated medically. Plans for eval in the future once patient stabilizes. Cardiology following  HTN: cont norvasc and metop, lasix as above  COVID: treatment per priamry; worsening O2 status could be from pulmonary edema  Dispo: remain inpatient given hyponatremia  Recommendations conveyed to primary service.    [facility] 02/16/2020 10:05 AM  ___________________________________________________________  CC: Hyponatremia, h/o renal transplant, ckd  Interval History/Subjective: Patient feels well today with some SOB minimal chest pain. No nausea or confusion. Na  increased to 126 today.   Medications:  Current Facility-Administered Medications  Medication Dose Route Frequency Provider Last Rate Last Admin  . acetaminophen (TYLENOL) tablet 650 mg  650 mg Oral Q6H PRN Adefeso, Oladapo, DO      . aerochamber Z-Stat Plus/medium 1 each  1 each Other Once Adefeso, Oladapo, DO      . albuterol (VENTOLIN HFA) 108 (90 Base) MCG/ACT inhaler 2 puff  2 puff Inhalation Q6H PRN Manuella Ghazi, Pratik D, DO   2 puff at 02/15/20 1213  . amLODipine (NORVASC) tablet 10 mg  10 mg Oral Daily Adefeso, Oladapo, DO   10 mg at 02/16/20 1003  . ascorbic acid (VITAMIN C) tablet 500 mg  500 mg Oral Daily Adefeso, Oladapo, DO   500 mg at 02/15/20 1011  . aspirin EC tablet 81 mg  81 mg Oral Daily Satira Sark, MD   81 mg at 02/16/20 1003  . atorvastatin (LIPITOR) tablet 80 mg  80 mg Oral Daily Reino Bellis B, NP   80 mg at 02/16/20 1002  . chlorpheniramine-HYDROcodone (TUSSIONEX) 10-8 MG/5ML suspension 5 mL  5 mL Oral Q12H PRN Adefeso, Oladapo, DO   5 mL at 02/15/20 1013  . furosemide (LASIX) injection 80 mg  80 mg Intravenous BID Reesa Chew, MD      . guaiFENesin-dextromethorphan [facility] DM) 100-10 MG/5ML syrup 10 mL  10 mL Oral Q4H PRN Adefeso, Oladapo, DO   10 mL at 02/15/20 2109  . insulin aspart (novoLOG) injection 0-20 Units  0-20 Units Subcutaneous Q4H Shah, Pratik D, DO   3 Units at 02/16/20 0436  . insulin detemir (LEVEMIR) injection 7 Units  0.075 Units/kg Subcutaneous BID Adefeso, Oladapo, DO   7 Units at 02/15/20 2108  . linagliptin (TRADJENTA)  tablet 5 mg  5 mg Oral Daily Adefeso, Oladapo, DO   5 mg at 02/16/20 1003  . metoprolol tartrate (LOPRESSOR) tablet 25 mg  25 mg Oral BID Imogene Burn, PA-C   25 mg at 02/16/20 1003  . mycophenolate (CELLCEPT) capsule 500 mg  500 mg Oral BID Vianne Bulls, MD   500 mg at 02/15/20 2109  . ondansetron (ZOFRAN) tablet 4 mg  4 mg Oral Q6H PRN Adefeso, Oladapo, DO       Or  . ondansetron (ZOFRAN) injection 4 mg  4 mg  Intravenous Q6H PRN Adefeso, Oladapo, DO      . pantoprazole (PROTONIX) EC tablet 40 mg  40 mg Oral Daily Adefeso, Oladapo, DO   40 mg at 02/16/20 1002  . predniSONE (DELTASONE) tablet 50 mg  50 mg Oral Daily Adefeso, Oladapo, DO   50 mg at 02/16/20 1002  . remdesivir 100 mg in sodium chloride 0.9 % 100 mL IVPB  100 mg Intravenous Daily Erenest Blank, RPH 200 mL/hr at 02/15/20 1001 100 mg at 02/15/20 1001  . tacrolimus (PROGRAF) capsule 1 mg  1 mg Oral BID Opyd, Ilene Qua, MD   1 mg at 02/16/20 1002  . zinc sulfate capsule 220 mg  220 mg Oral Daily Adefeso, Oladapo, DO   220 mg at 02/16/20 1001      Review of Systems: 10 systems reviewed and negative except per interval history/subjective  Physical Exam: Vitals:   02/16/20 0000 02/16/20 0400  BP: 117/63 124/65  Pulse:  73  Resp:  20  Temp:  97.9 F (36.6 C)  SpO2: 95% 96%   No intake/output data recorded.  Intake/Output Summary (Last 24 hours) at 02/16/2020 1005 Last data filed at 02/16/2020 0435 Gross per 24 hour  Intake 1300 ml  Output 2050 ml  Net -750 ml   Constitutional: well-appearing, no acute distress ENMT: ears and nose without scars or lesions, MMM CV: normal rate, trace edema in ble Respiratory: mild iwob, bilateral chest rise Gastrointestinal: soft, non-tender, no palpable masses or hernias Skin: no visible lesions or rashes Psych: alert, judgement/insight appropriate, appropriate mood and affect   Test Results I personally reviewed new and old clinical labs and radiology tests Lab Results  Component Value Date   NA 126 (L) 02/16/2020   K 4.3 02/16/2020   CL 97 (L) 02/16/2020   CO2 20 (L) 02/16/2020   BUN 58 (H) 02/16/2020   CREATININE 1.88 (H) 02/16/2020   CALCIUM 7.5 (L) 02/16/2020   ALBUMIN 2.7 (L) 02/16/2020   PHOS 4.2 02/16/2020

## 2020-02-16 NOTE — Progress Notes (Signed)
PROGRESS NOTE  Daniel Myers YJE:[id number] DOB: 09-09-1945 DOA: 02/11/2020 PCP: Claretta Fraise, MD  Brief History   75 year old man PMH including diabetes mellitus type 2, right kidney transplant, presented with shortness of breath and chest pain.  Admitted for acute hypoxic respiratory failure secondary to COVID-19 pneumonia, chest pain evaluation.  Found to have NSTEMI and transferred to [facility] for further evaluation.  Seen by cardiology, treated conservatively with medical therapy.  Mild Covid pneumonia responded well to remdesivir and steroids.  Developed mild volume overload and significant hyponatremia of unclear etiology.    NSTEMI --Remains asymptomatic. --2D echocardiogram showed mild LV dysfunction with segmental wall motion abnormalities --Cardiology recommends medical management in view of of renal insufficiency and history of renal transplant.  Ischemic work-up planned once patient has recovered. --Will continue beta-blocker, aspirin.    Acute hypoxic respiratory failure secondary to COVID-19 pneumonia and acute CHF. --Oxygen requirement up compared to yesterday, however he appears much better clinically.  History and clinical findings most consistent with CHF.  Chest x-ray bilateral infiltrates worse, my clinical impression is CHF.  Inflammatory markers are unimpressive, CRP trending down. --Continue current medications for Covid and CHF  --CXR no acute disease on admission --oxygen 5L > 7L (CHF) --inflammatory markers . Ferritin 117 > 99 > 269 > 251 > 213 . CRP 2.4 > 7.2 > 4.0 > 3.1 > 2.5 . Ddimer .68 > .66 > .40 > .59 > .63  --Tx . Remdesivir  . Steroids . Actemra/baricitinab not indicated . Prone as tolerated . Wean oxygen as tolerated  Acute combined systolic and diastolic CHF --Responding to IV Lasix, negative fluid balance last 24 hours. --Continue Lasix -- continue beta-blocker, unable to add ACE inhibitor with renal dysfunction.    Acute hyponatremia,  asymptomatic, possibly multifactorial including Covid, volume overload, status post renal transplant.  Did not respond to fluids. --Most likely related to CHF, improved today. --Continue IV Lasix today per nephrology, limit fluids.  BMP in a.m.  Nonoliguric AKI superimposed on CKD stage IIIa, status post renal transplant --Likely secondary to Covid.   --Creatinine stable.  Continue diuresis per nephrology.  Continue immunosuppression.  Thrombocytopenia --Appears to be chronic w/ acute component, was seen 5/21, 8/21, 11/21 --Remains variable but stable.  Elevated AST --Likely secondary to Covid.  Modestly elevated.  Status post renal transplant --Appears stable.  Continue immunosuppressants as per nephrology.  Diabetes mellitus type 2, A1c 6.9 --CBG remains stable, fasting blood sugar 119 --Continue Levemir and linagliptin  Disposition Plan:  Discussion: Somewhat worse today, likely secondary to acute CHF.  Hyponatremia worse.  Nephrology involved.  Plan diuresis.  Continue serial monitoring of hyponatremia.  Plan as above.  Status is: Inpatient  Remains inpatient appropriate because:IV treatments appropriate due to intensity of illness or inability to take PO and Inpatient level of care appropriate due to severity of illness   Dispo: The patient is from: Home              Anticipated d/c is to: Home              Anticipated d/c date is: 3 days              Patient currently is not medically stable to d/c.  DVT prophylaxis: SCDs Start: 02/12/20 0540 SCDs Start: 02/12/20 0346   Code Status: Full Code Family Communication: Daughter at bedside  Murray Hodgkins, MD  [facility] Direct contact: see [email or web address] (further directions at bottom of note if needed) 7PM-7AM contact  night coverage as at bottom of note 02/16/2020, 12:52 PM  LOS: 4 days     Consults:  . Cardiology    . Nephrology   Interval History/Subjective  CC: f/u NSTEMI  No issues overnight.  Breathing  better.  Still has some swelling.  Objective   Vitals:  Vitals:   02/16/20 0000 02/16/20 0400  BP: 117/63 124/65  Pulse:  73  Resp:  20  Temp:  97.9 F (36.6 C)  SpO2: 95% 96%    Exam:  Constitutional:   . Appears calm and comfortable today, no acute distress. ENMT:  . grossly normal hearing  Respiratory:  . CTA bilaterally, no w/r/r.  . Respiratory effort mildly increased.  Cardiovascular:  . RRR, no m/r/g . 1+ bilateral ankle edema   Abdomen:   abd wall edema Psychiatric:  . Mental status o Mood, affect appropriate  I have personally reviewed the following:   Today's Data  . I/O -568 . UOP 2550 . Wgts unreliable, doubt 14lb weight gain, no change in weight today . CBG remains stable . Sodium up to 126 . BUN and creatinine stable . AST without significant change . Ferritin, CRP trending down.  D-dimer stable.  Scheduled Meds: . aerochamber Z-Stat Plus/medium  1 each Other Once  . vitamin C  500 mg Oral Daily  . aspirin EC  81 mg Oral Daily  . atorvastatin  80 mg Oral Daily  . furosemide  80 mg Intravenous BID  . insulin aspart  0-20 Units Subcutaneous Q4H  . insulin detemir  0.075 Units/kg Subcutaneous BID  . linagliptin  5 mg Oral Daily  . metoprolol tartrate  25 mg Oral BID  . mycophenolate  500 mg Oral BID  . pantoprazole  40 mg Oral Daily  . predniSONE  50 mg Oral Daily  . tacrolimus  1 mg Oral BID  . zinc sulfate  220 mg Oral Daily   Continuous Infusions:   Principal Problem:   NSTEMI (non-ST elevated myocardial infarction) ([place]) Active Problems:   Essential hypertension   Insulin dependent type 2 diabetes mellitus ([place])   Renal transplant recipient   Mixed hyperlipidemia   Hyponatremia   Pneumonia due to COVID-19 virus   Hyperglycemia due to diabetes mellitus (HCC)   Acute respiratory failure with hypoxia (HCC)   CKD (chronic kidney disease), stage III (HCC)   Systolic and diastolic CHF, acute ([place])   Thrombocytopenia ([place])   AKI  (acute kidney injury) ([place])   LOS: 4 days   How to contact the [facility] Attending or Consulting provider [place] or covering provider during after hours [place], for this patient?  1. Check the care team in [facility] and look for a) attending/consulting TRH provider listed and b) the [facility] team listed 2. Log into [email or web address] and use [place] universal password to access. If you do not have the password, please contact the hospital operator. 3. Locate the [facility] provider you are looking for under [facility] and page to a number that you can be directly reached. 4. If you still have difficulty reaching the provider, please page the [facility] (Director on Call) for the Hospitalists listed on amion for assistance.

## 2020-02-17 ENCOUNTER — Encounter (HOSPITAL_COMMUNITY): Payer: Self-pay | Admitting: Internal Medicine

## 2020-02-17 DIAGNOSIS — N1831 Chronic kidney disease, stage 3a: Secondary | ICD-10-CM

## 2020-02-17 DIAGNOSIS — I4891 Unspecified atrial fibrillation: Secondary | ICD-10-CM

## 2020-02-17 DIAGNOSIS — I5041 Acute combined systolic (congestive) and diastolic (congestive) heart failure: Secondary | ICD-10-CM | POA: Diagnosis not present

## 2020-02-17 DIAGNOSIS — E119 Type 2 diabetes mellitus without complications: Secondary | ICD-10-CM

## 2020-02-17 DIAGNOSIS — U071 COVID-19: Secondary | ICD-10-CM | POA: Diagnosis not present

## 2020-02-17 DIAGNOSIS — N179 Acute kidney failure, unspecified: Secondary | ICD-10-CM

## 2020-02-17 DIAGNOSIS — Z940 Kidney transplant status: Secondary | ICD-10-CM

## 2020-02-17 DIAGNOSIS — J9601 Acute respiratory failure with hypoxia: Secondary | ICD-10-CM | POA: Diagnosis not present

## 2020-02-17 DIAGNOSIS — J1282 Pneumonia due to coronavirus disease 2019: Secondary | ICD-10-CM

## 2020-02-17 DIAGNOSIS — D696 Thrombocytopenia, unspecified: Secondary | ICD-10-CM

## 2020-02-17 DIAGNOSIS — I214 Non-ST elevation (NSTEMI) myocardial infarction: Secondary | ICD-10-CM | POA: Diagnosis not present

## 2020-02-17 DIAGNOSIS — Z794 Long term (current) use of insulin: Secondary | ICD-10-CM

## 2020-02-17 HISTORY — DX: Unspecified atrial fibrillation: I48.91

## 2020-02-17 LAB — COMPREHENSIVE METABOLIC PANEL
ALT: 39 U/L (ref 0–44)
AST: 59 U/L — ABNORMAL HIGH (ref 15–41)
Albumin: 2.6 g/dL — ABNORMAL LOW (ref 3.5–5.0)
Alkaline Phosphatase: 36 U/L — ABNORMAL LOW (ref 38–126)
Anion gap: 10 (ref 5–15)
BUN: 60 mg/dL — ABNORMAL HIGH (ref 8–23)
CO2: 22 mmol/L (ref 22–32)
Calcium: 7.4 mg/dL — ABNORMAL LOW (ref 8.9–10.3)
Chloride: 95 mmol/L — ABNORMAL LOW (ref 98–111)
Creatinine, Ser: 1.89 mg/dL — ABNORMAL HIGH (ref 0.61–1.24)
GFR, Estimated: 37 mL/min — ABNORMAL LOW (ref >60)
Glucose, Bld: 167 mg/dL — ABNORMAL HIGH (ref 70–99)
Potassium: 3.5 mmol/L (ref 3.5–5.1)
Sodium: 127 mmol/L — ABNORMAL LOW (ref 135–145)
Total Bilirubin: 1.2 mg/dL (ref 0.3–1.2)
Total Protein: 5.1 g/dL — ABNORMAL LOW (ref 6.5–8.1)

## 2020-02-17 LAB — CBC WITH DIFFERENTIAL/PLATELET
Abs Immature Granulocytes: 0.08 10*3/uL — ABNORMAL HIGH (ref 0.00–0.07)
Basophils Absolute: 0 10*3/uL (ref 0.0–0.1)
Basophils Relative: 0 %
Eosinophils Absolute: 0 10*3/uL (ref 0.0–0.5)
Eosinophils Relative: 0 %
HCT: 37.1 % — ABNORMAL LOW (ref 39.0–52.0)
Hemoglobin: 13 g/dL (ref 13.0–17.0)
Immature Granulocytes: 1 %
Lymphocytes Relative: 9 %
Lymphs Abs: 0.6 10*3/uL — ABNORMAL LOW (ref 0.7–4.0)
MCH: 28.8 pg (ref 26.0–34.0)
MCHC: 35 g/dL (ref 30.0–36.0)
MCV: 82.3 fL (ref 80.0–100.0)
Monocytes Absolute: 0.5 10*3/uL (ref 0.1–1.0)
Monocytes Relative: 8 %
Neutro Abs: 5.5 10*3/uL (ref 1.7–7.7)
Neutrophils Relative %: 82 %
Platelets: 129 10*3/uL — ABNORMAL LOW (ref 150–400)
RBC: 4.51 MIL/uL (ref 4.22–5.81)
RDW: 12.7 % (ref 11.5–15.5)
WBC: 6.8 10*3/uL (ref 4.0–10.5)
nRBC: 0 % (ref 0.0–0.2)

## 2020-02-17 LAB — CULTURE, BLOOD (ROUTINE X 2)
Culture: NO GROWTH
Culture: NO GROWTH
Special Requests: ADEQUATE
Special Requests: ADEQUATE

## 2020-02-17 LAB — FERRITIN: Ferritin: 233 ng/mL (ref 24–336)

## 2020-02-17 LAB — GLUCOSE, CAPILLARY
Glucose-Capillary: 100 mg/dL — ABNORMAL HIGH (ref 70–99)
Glucose-Capillary: 108 mg/dL — ABNORMAL HIGH (ref 70–99)
Glucose-Capillary: 155 mg/dL — ABNORMAL HIGH (ref 70–99)
Glucose-Capillary: 232 mg/dL — ABNORMAL HIGH (ref 70–99)
Glucose-Capillary: 278 mg/dL — ABNORMAL HIGH (ref 70–99)
Glucose-Capillary: 431 mg/dL — ABNORMAL HIGH (ref 70–99)

## 2020-02-17 LAB — PHOSPHORUS: Phosphorus: 2.9 mg/dL (ref 2.5–4.6)

## 2020-02-17 LAB — MAGNESIUM: Magnesium: 2.4 mg/dL (ref 1.7–2.4)

## 2020-02-17 LAB — C-REACTIVE PROTEIN: CRP: 4.7 mg/dL — ABNORMAL HIGH (ref <1.0)

## 2020-02-17 LAB — D-DIMER, QUANTITATIVE: D-Dimer, Quant: 1.13 ug/mL-FEU — ABNORMAL HIGH (ref 0.00–0.50)

## 2020-02-17 MED ORDER — APIXABAN 5 MG PO TABS
5.0000 mg | ORAL_TABLET | Freq: Two times a day (BID) | ORAL | Status: DC
  Administered 2020-02-17 – 2020-02-18 (×2): 5 mg via ORAL
  Filled 2020-02-17 (×3): qty 1

## 2020-02-17 MED ORDER — INSULIN ASPART 100 UNIT/ML ~~LOC~~ SOLN: 8.0000 [IU] | Freq: Once | SUBCUTANEOUS | Status: DC

## 2020-02-17 MED ORDER — METOPROLOL TARTRATE 25 MG PO TABS
25.0000 mg | ORAL_TABLET | Freq: Three times a day (TID) | ORAL | Status: DC
  Administered 2020-02-17 – 2020-02-18 (×3): 25 mg via ORAL
  Filled 2020-02-17 (×3): qty 1

## 2020-02-17 NOTE — Progress Notes (Signed)
Progress Note  Patient Name: Daniel Myers Date of Encounter: 02/17/2020  Primary Cardiologist: Rozann Lesches, MD   Subjective   Remains in atrial fibrillation, with dyspnea  Inpatient Medications    Scheduled Meds: . aerochamber Z-Stat Plus/medium  1 each Other Once  . vitamin C  500 mg Oral Daily  . aspirin EC  81 mg Oral Daily  . atorvastatin  80 mg Oral Daily  . furosemide  80 mg Intravenous BID  . insulin aspart  0-20 Units Subcutaneous Q4H  . insulin detemir  0.075 Units/kg Subcutaneous BID  . linagliptin  5 mg Oral Daily  . metoprolol tartrate  25 mg Oral BID  . mycophenolate  500 mg Oral BID  . pantoprazole  40 mg Oral Daily  . predniSONE  50 mg Oral Daily  . tacrolimus  1 mg Oral BID  . zinc sulfate  220 mg Oral Daily   Continuous Infusions:  PRN Meds: acetaminophen, albuterol, chlorpheniramine-HYDROcodone, guaiFENesin-dextromethorphan, LORazepam, ondansetron **OR** ondansetron (ZOFRAN) IV   Vital Signs    Vitals:   02/17/20 0036 02/17/20 0440 02/17/20 0735 02/17/20 0834  BP: 140/67 (!) 159/84 (!) 150/95 (!) 158/85  Pulse: 88 82 (!) 107   Resp:   18 18  Temp: 98.2 F (36.8 C) 97.8 F (36.6 C) 97.8 F (36.6 C) 98.6 F (37 C)  TempSrc:  Oral Oral Oral  SpO2: 94% 94% 95% 98%  Weight:  98.2 kg    Height:        Intake/Output Summary (Last 24 hours) at 02/17/2020 0936 Last data filed at 02/17/2020 0836 Gross per 24 hour  Intake 860 ml  Output 3825 ml  Net -2965 ml   Filed Weights   02/15/20 0100 02/16/20 0400 02/17/20 0440  Weight: 100.8 kg 100.8 kg 98.2 kg    Telemetry    Atrial fibrillation with a rapid ventricular response- Personally Reviewed  ECG    None- Personally Reviewed  Physical Exam   GEN: No acute distress.   Neck: No JVD Cardiac: IRRR, no murmurs, rubs, or gallops.  Respiratory:  Scattered rales no increased work of breathing GI: Soft, nontender, non-distended  MS: No edema; No deformity. Neuro:  Nonfocal  Psych:  Normal affect   Labs    Chemistry Recent Labs  Lab 02/15/20 0203 02/15/20 1450 02/16/20 0412 02/16/20 1556 02/17/20 0236  NA 122*  122*   < > 126* 126* 127*  K 4.4  --  4.3 4.1 3.5  CL 94*  --  97* 94* 95*  CO2 17*  --  20* 20* 22  GLUCOSE 201*  --  147* 144* 167*  BUN 57*  --  58* 61* 60*  CREATININE 1.85*  --  1.88* 1.96* 1.89*  CALCIUM 7.6*  --  7.5* 7.5* 7.4*  PROT 5.5*  --  5.1*  --  5.1*  ALBUMIN 2.9*  --  2.7*  --  2.6*  AST 75*  --  67*  --  59*  ALT 37  --  39  --  39  ALKPHOS 37*  --  34*  --  36*  BILITOT 0.9  --  0.7  --  1.2  GFRNONAA 38*  --  37* 35* 37*  ANIONGAP 11  --  9 12 10    < > = values in this interval not displayed.     Hematology Recent Labs  Lab 02/15/20 0203 02/16/20 0412 02/17/20 0236  WBC 7.5 6.5 6.8  RBC 4.53 4.26 4.51  HGB 13.7 12.7* 13.0  HCT 37.7* 35.3* 37.1*  MCV 83.2 82.9 82.3  MCH 30.2 29.8 28.8  MCHC 36.3* 36.0 35.0  RDW 12.9 12.8 12.7  PLT 113* 105* 129*    Cardiac EnzymesNo results for input(s): TROPONINI in the last 168 hours. No results for input(s): TROPIPOC in the last 168 hours.   BNP Recent Labs  Lab 02/11/20 2330  BNP 842.0*     DDimer  Recent Labs  Lab 02/15/20 0203 02/16/20 0412 02/17/20 0236  DDIMER 0.59* 0.63* 1.13*     Radiology    DG CHEST PORT 1 VIEW  Result Date: 02/16/2020 CLINICAL DATA:  Diffuse interstitial lung infiltrate. Pleural effusion. EXAM: PORTABLE CHEST 1 VIEW COMPARISON:  February 11, 2020 FINDINGS: Stable cardiomegaly. The hila and mediastinum are unchanged. No pneumothorax. Pulmonary infiltrates are identified centrally bilaterally, right slightly greater than left. Mild haziness more peripherally in the lungs. No other acute abnormalities. IMPRESSION: Predominantly central bilateral pulmonary infiltrates, right slightly greater than left. These findings could represent developing edema/ARDS. An infectious process/pneumonia is not excluded. If there is concern for infection,  atypical infections should be considered. Electronically Signed   By: Dorise Bullion III M.D   On: 02/16/2020 12:08    Cardiac Studies   2D echo with EF 40%, multiple wall motion abnormalities  Patient Profile     75 y.o. male admitted with acute respiratory failure and NSTEMI, with Covid infection, unvaccinated  Assessment & Plan    1.  NSTEMI -he is pain-free.  His creatinine remains elevated.  Note nephrology consultation.  If kidney function improves, would consider heart catheterization later in his admission or as an outpatient if no recurrent unstable angina 2.  Acute on chronic renal failure -note nephrology consult.  Recommendations reviewed. 3.  Acute on chronic systolic/diastolic heart failure -his weight is down 2 kg and his renal function is unchanged. 4.  Atrial fibrillation with a rapid ventricular response -I would recommend up titration of his beta-blocker.     For questions or updates, please contact [place] Please consult [email or web address] for contact info under Cardiology/STEMI.      Signed, Cristopher Peru, MD  02/17/2020, 9:36 AM  Patient ID: Daniel Myers, male   DOB: 05/27/1945, 75 y.o.   MRN: [id number]

## 2020-02-17 NOTE — Progress Notes (Signed)
PROGRESS NOTE  Daniel Myers JHE:[id number] DOB: 07/14/1945 DOA: 02/11/2020 PCP: Claretta Fraise, MD  Brief History   75 year old man PMH including diabetes mellitus type 2, right kidney transplant, presented with shortness of breath and chest pain.  Admitted for acute hypoxic respiratory failure secondary to COVID-19 pneumonia, chest pain evaluation.  Found to have NSTEMI and transferred to [facility] for further evaluation.  Seen by cardiology, treated conservatively with medical therapy.  Mild Covid pneumonia responded well to remdesivir and steroids.  Developed volume overload and significant hyponatremia of unclear etiology.  Responded well to diuresis, weaning oxygen, sodium slowly coming up.  Now in atrial fibrillation 1/9, new diagnosis.  NSTEMI.  Echocardiogram showed mild LV dysfunction and segmental wall motion abnormalities. --Patient remains asymptomatic, --Continue medical management with beta-blocker and aspirin per cardiology.  Not on ACE inhibitor or ARB secondary to renal dysfunction. --Ischemic work-up being contemplated by cardiology.  Acute hypoxic respiratory failure secondary to COVID-19 pneumonia and acute CHF. --Appears much better today, oxygen requirement down to 4 L, suspect can titrate even lower.  Interval decompensation secondary to CHF and volume overload.  Appears to be recovering nicely from Covid.   --Continue diuresis per nephrology.  --CXR no acute disease on admission --oxygen 4L --inflammatory markers . Ferritin 117 > 99 > 269 > 251 > 213 > 233 . CRP 2.4 > 7.2 > 4.0 > 3.1 > 2.5 > 4.7 . Ddimer .68 > .66 > .40 > .59 > .63 > 1.13 --Tx . Status post remdesivir  . Steroids . Actemra/baricitinab not indicated . Prone as tolerated . Wean oxygen as tolerated  Atrial fibrillation with rapid ventricular response --New diagnosis 1/9, has been in sinus rhythm up to this point both on EKG and telemetry.  Presumably secondary to respiratory drive from Covid and  CHF. --Asymptomatic with heart rate in the 100s --Increase beta-blocker, check TSH --CHA2DS2-VASc = 5. Cardiology has rec'd functional testing after recovery from [place]. Will start apixaban.  Acute combined systolic and diastolic CHF --Excellent urine output with IV Lasix.  Renal function stable.  --Continue Lasix as per nephrology --Continue beta-blocker, unable to add ACE inhibitor with renal dysfunction.    Acute hyponatremia, asymptomatic, possibly multifactorial including Covid, volume overload, status post renal transplant.  Did not respond to fluids. --Most likely related to CHF, slowly improving.  Continue Lasix per nephrology.  Nephrology considering tolvaptan and event further improvement is not seen.  Nonoliguric AKI superimposed on CKD stage IIIa, status post renal transplant --Likely secondary to Covid.   --Creatinine remains elevated but stable.  Management per nephrology. --Continue immunosuppressants.  Thrombocytopenia --Appears to be chronic w/ acute component, was seen 5/21, 8/21, 11/21 --Labile but stable  Elevated AST --secondary to Covid.  Modestly elevated.  Diabetes mellitus type 2, A1c 6.9 --CBG stable, fasting blood sugar 108 --continue Levemir and linagliptin  Disposition Plan:  Discussion: Somewhat worse today, likely secondary to acute CHF.  Hyponatremia worse.  Nephrology involved.  Plan diuresis.  Continue serial monitoring of hyponatremia.  Plan as above.  Status is: Inpatient  Remains inpatient appropriate because:IV treatments appropriate due to intensity of illness or inability to take PO and Inpatient level of care appropriate due to severity of illness   Dispo: The patient is from: Home              Anticipated d/c is to: Home              Anticipated d/c date is: 3 days  Patient currently is not medically stable to d/c.  DVT prophylaxis: SCDs Start: 02/12/20 0540 SCDs Start: 02/12/20 0346   Code Status: Full Code Family  Communication: Daughter at bedside  Murray Hodgkins, MD  [facility] Direct contact: see [email or web address] (further directions at bottom of note if needed) 7PM-7AM contact night coverage as at bottom of note 02/17/2020, 2:47 PM  LOS: 5 days     Consults:  . Cardiology    . Nephrology   Interval History/Subjective  CC: f/u NSTEMI  Feels a lot better, breathing better, urinated frequently, swelling going down.  Wants to go home.  "Do you have good news for me?"  Objective   Vitals:  Vitals:   02/17/20 0735 02/17/20 0834  BP: (!) 150/95 (!) 158/85  Pulse: (!) 107   Resp: 18 18  Temp: 97.8 F (36.6 C) 98.6 F (37 C)  SpO2: 95% 98%    Exam:  Constitutional:   . Appears calm and comfortable, much better today ENMT:  . grossly normal hearing  Respiratory:  . CTA bilaterally, no w/r/r.  . Respiratory effort normal.  . On 4L [place] Cardiovascular:  . Irregular, tachy 100s, no m/r/g . No LE extremity edema   Abdomen:  . abd wall edema significantly decreased Psychiatric:  . Mental status o Mood, affect appropriate   I have personally reviewed the following:   Today's Data  . I/O -2085 . UOP 3125 . Wgt down 6 pounds . CBG stable . Sodium up to 127 . BUN and creatinine slightly improved at 60 and 1.89. . AST down to 59  Scheduled Meds: . aerochamber Z-Stat Plus/medium  1 each Other Once  . vitamin C  500 mg Oral Daily  . aspirin EC  81 mg Oral Daily  . atorvastatin  80 mg Oral Daily  . furosemide  80 mg Intravenous BID  . insulin aspart  0-20 Units Subcutaneous Q4H  . insulin detemir  0.075 Units/kg Subcutaneous BID  . linagliptin  5 mg Oral Daily  . metoprolol tartrate  25 mg Oral TID  . mycophenolate  500 mg Oral BID  . pantoprazole  40 mg Oral Daily  . predniSONE  50 mg Oral Daily  . tacrolimus  1 mg Oral BID  . zinc sulfate  220 mg Oral Daily   Continuous Infusions:   Principal Problem:   NSTEMI (non-ST elevated myocardial infarction) ([place]) Active  Problems:   Essential hypertension   Insulin dependent type 2 diabetes mellitus ([place])   Renal transplant recipient   Mixed hyperlipidemia   Hyponatremia   Pneumonia due to COVID-19 virus   Hyperglycemia due to diabetes mellitus (HCC)   Acute respiratory failure with hypoxia (HCC)   CKD (chronic kidney disease), stage III (HCC)   Systolic and diastolic CHF, acute (HCC)   Thrombocytopenia (HCC)   AKI (acute kidney injury) ([place])   Unspecified atrial fibrillation ([place])   LOS: 5 days   How to contact the [facility] Attending or Consulting provider [place] or covering provider during after hours 7P -7A, for this patient?  1. Check the care team in [facility] and look for a) attending/consulting TRH provider listed and b) the [facility] team listed 2. Log into [email or web address] and use [place] universal password to access. If you do not have the password, please contact the hospital operator. 3. Locate the [facility] provider you are looking for under [facility] and page to a number that you can be directly reached. 4. If you still have  difficulty reaching the provider, please page the [facility] (Director on Call) for the Hospitalists listed on amion for assistance.

## 2020-02-17 NOTE — Plan of Care (Signed)
  Problem: Coping: Goal: Psychosocial and spiritual needs will be supported Outcome: Progressing   Problem: Respiratory: Goal: Will maintain a patent airway Outcome: Progressing   

## 2020-02-17 NOTE — Progress Notes (Signed)
[place] for apixaban  Indication: afib  Allergies  Allergen Reactions  . Other Rash    Use paper tape.   . Tape Rash    Use paper tape.   . Sulfur Rash    Patient Measurements: Height: 5\' 10"  (177.8 cm) Weight: 98.2 kg (216 lb 6.4 oz) (scale c) IBW/kg (Calculated) : 73 Heparin Dosing Weight: 93 kg  Vital Signs: Temp: 98.6 F (37 C) (01/09 0834) Temp Source: Oral (01/09 0834) BP: 158/85 (01/09 0834) Pulse Rate: 107 (01/09 0735)  Labs: Recent Labs    02/15/20 0203 02/16/20 0412 02/16/20 1556 02/17/20 0236  HGB 13.7 12.7*  --  13.0  HCT 37.7* 35.3*  --  37.1*  PLT 113* 105*  --  129*  CREATININE 1.85* 1.88* 1.96* 1.89*    Estimated Creatinine Clearance: 40.9 mL/min (A) (by C-G formula based on SCr of 1.89 mg/dL (H)).  Assessment: 75 y.o. male admitted with chest pain. Patient known to pharmacy services for heparin dosing in r/o ACS. Heparin has since been discontinued with planned medical management given AKI.  New afib with rvr noted this am. Orders to transition patient to apixaban.   Goal of Therapy:  Therapeutic anticoagulation Monitor platelets by anticoagulation protocol: Yes   Plan:  Apixaban 5mg  bid - no dose adjustments warranted CBC as indicated  Erin Hearing PharmD., BCPS Clinical Pharmacist 02/17/2020 2:52 PM

## 2020-02-17 NOTE — Plan of Care (Signed)
  Problem: Education: Goal: Knowledge of risk factors and measures for prevention of condition will improve Outcome: Progressing   Problem: Coping: Goal: Psychosocial and spiritual needs will be supported Outcome: Progressing   Problem: Respiratory: Goal: Will maintain a patent airway Outcome: Progressing   

## 2020-02-17 NOTE — Progress Notes (Addendum)
Nephrology Follow-Up Consult note   Assessment/Recommendations: Daniel Myers is a/an 75 y.o. male with a past medical history significant for HTN, HLD, DM two, obesity, CKD, renal transplant (2008)  who present w/ COVID, NSTEMI, hyponatremia  Hyponatremia: Urine lites on 1/8 w/ urine na of 13 which is not c/w SIADH. Most likely related to CHF.  Serum sodium is slowly improving to 127 today -Continue diuretics; 80mg  IV BID today -Could consider tolvaptan or urea if Na fails to improve -Hopefully switch to orals tomorrow; ideally once serum sodium is 130 -Counseled to limit fluids today  Non-Oliguric AKI on CKD w/ h/o Renal transplant: BL Crt 1.5, h/o tx in 2008 w/ biopsy in November w/o rejection (reportedly proteinuria that has since resolved). Mild AKI at this time, unclear cause but likely associated w/ COVID.  Creatinine essentially stable -Diuresis as above -Continue immunosuppression as ordered  -Continue to monitor daily Cr, Dose meds for GFR -Monitor Daily I/Os, Daily weight  -Maintain MAP>65 for optimal renal perfusion.  -Avoid nephrotoxic medications including NSAIDs and Vanc/Zosyn combo  CHF exacerbation: concern for volume overload.  Weight improving.  Creatinine stable -IV lasix as above; goal net negative today -Cardiology following; appreicate help  NSTEMI: w/ slight decrease in EF. Treated medically. Plans for eval in the future once patient stabilizes. Cardiology following  HTN: cont norvasc and metop, lasix as above  COVID: treatment per priamry; O2 status improving  Dispo: remain inpatient given hyponatremia.  Hopefully discharge in 24 to 48 hours  Recommendations conveyed to primary service.    [facility] 02/17/2020 7:07 AM  ___________________________________________________________  CC: Hyponatremia, h/o renal transplant, ckd  Interval History/Subjective: Due to the COVID pandemic, in attempts to limit transmission  and over-utilization of resources (e.g. PPE), the patient was seen virtually today by means of chart review, discussion with staff, and virtual discussion with patient as needed.   Patient's creatinine is stable at 1.9.  Sodium slowly improving 127 today.  Urine output good at 2.6 L net -1.5 L yesterday.  Mildly hypertensive now on room air.   Medications:  Current Facility-Administered Medications  Medication Dose Route Frequency Provider Last Rate Last Admin  . acetaminophen (TYLENOL) tablet 650 mg  650 mg Oral Q6H PRN Adefeso, Oladapo, DO      . aerochamber Z-Stat Plus/medium 1 each  1 each Other Once Adefeso, Oladapo, DO      . albuterol (VENTOLIN HFA) 108 (90 Base) MCG/ACT inhaler 2 puff  2 puff Inhalation Q6H PRN Manuella Ghazi, Pratik D, DO   2 puff at 02/16/20 1045  . ascorbic acid (VITAMIN C) tablet 500 mg  500 mg Oral Daily Adefeso, Oladapo, DO   500 mg at 02/16/20 1004  . aspirin EC tablet 81 mg  81 mg Oral Daily Satira Sark, MD   81 mg at 02/16/20 1003  . atorvastatin (LIPITOR) tablet 80 mg  80 mg Oral Daily Reino Bellis B, NP   80 mg at 02/16/20 1002  . chlorpheniramine-HYDROcodone (TUSSIONEX) 10-8 MG/5ML suspension 5 mL  5 mL Oral Q12H PRN Adefeso, Oladapo, DO   5 mL at 02/16/20 1024  . furosemide (LASIX) injection 80 mg  80 mg Intravenous BID Reesa Chew, MD   80 mg at 02/17/20 2992  . guaiFENesin-dextromethorphan (ROBITUSSIN DM) 100-10 MG/5ML syrup 10 mL  10 mL Oral Q4H PRN Adefeso, Oladapo, DO   10 mL at 02/15/20 2109  . insulin aspart (novoLOG) injection 0-20 Units  0-20 Units Subcutaneous Q4H  Manuella Ghazi, Pratik D, DO   7 Units at 02/17/20 0159  . insulin detemir (LEVEMIR) injection 7 Units  0.075 Units/kg Subcutaneous BID Adefeso, Oladapo, DO   7 Units at 02/16/20 2248  . linagliptin (TRADJENTA) tablet 5 mg  5 mg Oral Daily Adefeso, Oladapo, DO   5 mg at 02/16/20 1003  . LORazepam (ATIVAN) tablet 0.5 mg  0.5 mg Oral Q8H PRN Samuella Cota, MD      . metoprolol tartrate  (LOPRESSOR) tablet 25 mg  25 mg Oral BID Imogene Burn, PA-C   25 mg at 02/16/20 2251  . mycophenolate (CELLCEPT) capsule 500 mg  500 mg Oral BID Opyd, Ilene Qua, MD   500 mg at 02/16/20 2251  . ondansetron (ZOFRAN) tablet 4 mg  4 mg Oral Q6H PRN Adefeso, Oladapo, DO       Or  . ondansetron (ZOFRAN) injection 4 mg  4 mg Intravenous Q6H PRN Adefeso, Oladapo, DO      . pantoprazole (PROTONIX) EC tablet 40 mg  40 mg Oral Daily Adefeso, Oladapo, DO   40 mg at 02/16/20 1002  . predniSONE (DELTASONE) tablet 50 mg  50 mg Oral Daily Adefeso, Oladapo, DO   50 mg at 02/16/20 1002  . tacrolimus (PROGRAF) capsule 1 mg  1 mg Oral BID Opyd, Ilene Qua, MD   1 mg at 02/16/20 2250  . zinc sulfate capsule 220 mg  220 mg Oral Daily Adefeso, Oladapo, DO   220 mg at 02/16/20 1001        Physical Exam: Vitals:   02/17/20 0036 02/17/20 0440  BP: 140/67 (!) 159/84  Pulse: 88 82  Resp:    Temp: 98.2 F (36.8 C) 97.8 F (36.6 C)  SpO2: 94% 94%   No intake/output data recorded.  Intake/Output Summary (Last 24 hours) at 02/17/2020 0707 Last data filed at 02/17/2020 0300 Gross per 24 hour  Intake 1040 ml  Output 2575 ml  Net -1535 ml   Patient not examined today   Test Results I personally reviewed new and old clinical labs and radiology tests Lab Results  Component Value Date   NA 127 (L) 02/17/2020   K 3.5 02/17/2020   CL 95 (L) 02/17/2020   CO2 22 02/17/2020   BUN 60 (H) 02/17/2020   CREATININE 1.89 (H) 02/17/2020   CALCIUM 7.4 (L) 02/17/2020   ALBUMIN 2.6 (L) 02/17/2020   PHOS 2.9 02/17/2020

## 2020-02-17 NOTE — Care Management (Signed)
Benefit check for Eliquis submitted, wil result Monday. Patient will be eligible for the 30 day free card from the manufacturer, or can have med filled through [facility].

## 2020-02-18 DIAGNOSIS — R778 Other specified abnormalities of plasma proteins: Secondary | ICD-10-CM | POA: Diagnosis not present

## 2020-02-18 DIAGNOSIS — I4819 Other persistent atrial fibrillation: Secondary | ICD-10-CM

## 2020-02-18 DIAGNOSIS — N179 Acute kidney failure, unspecified: Secondary | ICD-10-CM | POA: Diagnosis not present

## 2020-02-18 DIAGNOSIS — U071 COVID-19: Secondary | ICD-10-CM | POA: Diagnosis not present

## 2020-02-18 DIAGNOSIS — J9601 Acute respiratory failure with hypoxia: Secondary | ICD-10-CM | POA: Diagnosis not present

## 2020-02-18 DIAGNOSIS — Z940 Kidney transplant status: Secondary | ICD-10-CM | POA: Diagnosis not present

## 2020-02-18 DIAGNOSIS — I214 Non-ST elevation (NSTEMI) myocardial infarction: Secondary | ICD-10-CM | POA: Diagnosis not present

## 2020-02-18 DIAGNOSIS — E119 Type 2 diabetes mellitus without complications: Secondary | ICD-10-CM | POA: Diagnosis not present

## 2020-02-18 LAB — BASIC METABOLIC PANEL
Anion gap: 11 (ref 5–15)
BUN: 58 mg/dL — ABNORMAL HIGH (ref 8–23)
CO2: 25 mmol/L (ref 22–32)
Calcium: 7.8 mg/dL — ABNORMAL LOW (ref 8.9–10.3)
Chloride: 97 mmol/L — ABNORMAL LOW (ref 98–111)
Creatinine, Ser: 2.13 mg/dL — ABNORMAL HIGH (ref 0.61–1.24)
GFR, Estimated: 32 mL/min — ABNORMAL LOW (ref >60)
Glucose, Bld: 113 mg/dL — ABNORMAL HIGH (ref 70–99)
Potassium: 3.6 mmol/L (ref 3.5–5.1)
Sodium: 133 mmol/L — ABNORMAL LOW (ref 135–145)

## 2020-02-18 LAB — GLUCOSE, CAPILLARY
Glucose-Capillary: 100 mg/dL — ABNORMAL HIGH (ref 70–99)
Glucose-Capillary: 263 mg/dL — ABNORMAL HIGH (ref 70–99)
Glucose-Capillary: 323 mg/dL — ABNORMAL HIGH (ref 70–99)

## 2020-02-18 MED ORDER — ALBUTEROL SULFATE HFA 108 (90 BASE) MCG/ACT IN AERS: 2.0000 | INHALATION_SPRAY | Freq: Four times a day (QID) | RESPIRATORY_TRACT | 0 refills | Status: DC | PRN

## 2020-02-18 MED ORDER — PREDNISONE 10 MG PO TABS: ORAL_TABLET | ORAL | 0 refills | Status: DC

## 2020-02-18 MED ORDER — ZINC SULFATE 220 (50 ZN) MG PO CAPS: 220.0000 mg | ORAL_CAPSULE | Freq: Every day | ORAL | Status: DC

## 2020-02-18 MED ORDER — METOPROLOL TARTRATE 50 MG PO TABS: 50.0000 mg | ORAL_TABLET | Freq: Two times a day (BID) | ORAL | 2 refills | Status: DC

## 2020-02-18 MED ORDER — APIXABAN 5 MG PO TABS: 5.0000 mg | ORAL_TABLET | Freq: Two times a day (BID) | ORAL | 1 refills | Status: DC

## 2020-02-18 MED ORDER — ASCORBIC ACID 500 MG PO TABS: 500.0000 mg | ORAL_TABLET | Freq: Every day | ORAL | Status: DC

## 2020-02-18 MED ORDER — METOPROLOL TARTRATE 50 MG PO TABS: 50.0000 mg | ORAL_TABLET | Freq: Two times a day (BID) | ORAL | Status: DC

## 2020-02-18 MED ORDER — FUROSEMIDE 40 MG PO TABS
40.0000 mg | ORAL_TABLET | Freq: Two times a day (BID) | ORAL | Status: DC
Start: 2020-02-18 — End: 2020-02-18

## 2020-02-18 NOTE — Discharge Summary (Addendum)
Physician Discharge Summary  Daniel Myers VPX:[id number] DOB: 11/12/1945 DOA: 02/11/2020  PCP: Claretta Fraise, MD  Admit date: 02/11/2020 Discharge date: 02/18/2020  Recommendations for Outpatient Follow-up:  1. NSTEMI.  Can consider ischemic evaluation as an outpatient. 2. New dx Afib started on apixaban, in context of COVID. Consider for opportunity to d/c as outpt if reverts to SR. 3. AKI superimposed on CKD.  Suggest repeat BMP. 4. Bactrim on hold secondary to AKI.  Resume as per primary nephrologist.   Follow-up Information    Stacks, [name], MD. Schedule an appointment as soon as possible for a visit in 1 week(s).   Specialty: Family Medicine Contact information: [place] [address] [phone]        Satira Sark, MD Follow up.   Specialty: Cardiology Why: Office will contact you with an appointment Contact information: [place] [address] [phone]                Discharge Diagnoses: Principal diagnosis is #1 Principal Problem:   NSTEMI (non-ST elevated myocardial infarction) ([place]) Active Problems:   Essential hypertension   Insulin dependent type 2 diabetes mellitus ([place])   Renal transplant recipient   Mixed hyperlipidemia   Hyponatremia   Pneumonia due to COVID-19 virus   Hyperglycemia due to diabetes mellitus ([place])   Acute respiratory failure with hypoxia ([place])   CKD (chronic kidney disease), stage III (HCC)   Systolic and diastolic CHF, acute (HCC)   Thrombocytopenia ([place])   AKI (acute kidney injury) ([place])   Unspecified atrial fibrillation ([place])   Discharge Condition: improved Disposition: home  Diet recommendation:  Diet Orders (From admission, onward)    Start     Ordered   02/18/20 0000  Diet - low sodium heart healthy        02/18/20 1308   02/18/20 0000  Diet Carb Modified        02/18/20 1308   02/15/20 1047  Diet heart healthy/carb modified Room service appropriate? Yes; Fluid consistency:  Thin; Fluid restriction: 1500 mL Fluid  Diet effective now       Question Answer Comment  Diet-HS Snack? Nothing   Room service appropriate? Yes   Fluid consistency: Thin   Fluid restriction: 1500 mL Fluid      02/15/20 1046           Filed Weights   02/16/20 0400 02/17/20 0440 02/18/20 0622  Weight: 100.8 kg 98.2 kg 96 kg    HPI/Hospital Course:   75 year old man PMH including diabetes mellitus type 2, right kidney transplant, presented with shortness of breath and chest pain.  Admitted for acute hypoxic respiratory failure secondary to COVID-19 pneumonia, chest pain evaluation.  Found to have NSTEMI and transferred to [facility] for further evaluation.  Seen by cardiology, treated conservatively with medical therapy.  Mild Covid pneumonia responded well to remdesivir and steroids.  Developed volume overload and significant hyponatremia of unclear etiology.  Responded well to diuresis, with gradual improvement of sodium and resolution of hypoxia.  Now appears stable from cardiac and renal standpoint, discussed with both disciplines, cleared for discharge.  Close outpatient follow-up with cardiology and primary nephrologist.  Discussed in detail with daughter at bedside, she will arrange outpatient follow-up in the near future with primary nephrologist.  NSTEMI.  Echocardiogram showed mild LV dysfunction and segmental wall motion abnormalities. --Continue medical management with beta-blocker and aspirin per cardiology.  Not on ACE inhibitor or ARB secondary to renal dysfunction. --  Ischemic work-up being contemplated by cardiology as an outpatient after recovery  Acute hypoxic respiratory failure secondary to COVID-19 pneumonia and acute CHF. --Hypoxia resolved with treatment of Covid and CHF.  Ambulate in room without desaturation, SPO2 91-96. --Home today  --CXR no acute disease on admission --oxygen RA --Tx . Status post remdesivir  . Steroid taper . Actemra/baricitinab not  indicated  Atrial fibrillation with rapid ventricular response --New diagnosis 1/9, has been in sinus rhythm up to that point both on EKG and telemetry.  Presumably secondary to respiratory drive from Covid and CHF. --Asymptomatic with heart rate in the 100s --Increase beta-blocker, follow-up TSH as an outpatient.   --CHA2DS2-VASc = 5. Cardiology has rec'd functional testing after recovery from [place].  Started apixaban.  Acute combined systolic and diastolic CHF --Excellent urine output with IV Lasix.  Appears euvolemic now. --Discussed with Dr. Candiss Norse nephrology, recommends resuming home dose of Lasix and close follow-up. --Continue beta-blocker, unable to add ACE inhibitor with renal dysfunction.    Acute hyponatremia, asymptomatic, possibly multifactorial including Covid, volume overload, status post renal transplant.  Did not respond to fluids. --Secondary to CHF, slowly improving.   --Near normal  Nonoliguric AKI superimposed on CKD stage IIIa, status post renal transplant --Likely secondary to Covid.   --Creatinine slightly high but okay to go home per nephrology.  Hold Bactrim until follow-up with primary nephrologist. --Continue immunosuppressants.  Thrombocytopenia --Appears to be chronic w/ acute component, was seen 5/21, 8/21, 11/21 --Labile but stable  Elevated AST --secondary to Covid.  Trending down  Diabetes mellitus type 2, A1c 6.9 --CBG stable, resume outpatient regimen on discharge.  Consults:   Cardiology     Nephrology   Today's assessment: S: CC: f/u SOB  Feels much better, breathing better, no SOB, no pain, ready to go home.  O: Vitals:  Vitals:   02/18/20 0000 02/18/20 0622  BP: (!) 140/91 (!) 151/93  Pulse: (!) 105 (!) 109  Resp:  18  Temp: 97.6 F (36.4 C) 98.2 F (36.8 C)  SpO2: 92% 99%    Constitutional:  . Appears calm and comfortable sitting in chair ENMT:  . grossly normal hearing  Respiratory:  . CTA bilaterally, no w/r/r.   . Respiratory effort normal.  Cardiovascular:  . RRR, no m/r/g . No LE extremity edema   Psychiatric:  . judgement and insight appear normal . Mental status o Mood, affect appropriate  CBG stable Na+ up to 133 Cr up to 2.13  Discharge Instructions  Discharge Instructions    (HEART FAILURE PATIENTS) Call MD:  Anytime you have any of the following symptoms: 1) 3 pound weight gain in 24 hours or 5 pounds in 1 week 2) shortness of breath, with or without a dry hacking cough 3) swelling in the hands, feet or stomach 4) if you have to sleep on extra pillows at night in order to breathe.   Complete by: As directed    Diet - low sodium heart healthy   Complete by: As directed    Diet Carb Modified   Complete by: As directed    Discharge instructions   Complete by: As directed    Call your physician or seek immediate medical attention for fever, shortness of breath, swelling, or worsening of condition. Self-isolate until 1/25. Do not take Bactrim until advised to do so by your kidney doctor.   Increase activity slowly   Complete by: As directed      Allergies as of 02/18/2020  Reactions   Other Rash   Use paper tape.    Tape Rash   Use paper tape.    Sulfur Rash      Medication List    STOP taking these medications   amLODipine 10 MG tablet Commonly known as: NORVASC   aspirin EC 81 MG tablet   rosuvastatin 10 MG tablet Commonly known as: Crestor   sulfamethoxazole-trimethoprim 400-80 MG tablet Commonly known as: BACTRIM     TAKE these medications   albuterol 108 (90 Base) MCG/ACT inhaler Commonly known as: VENTOLIN HFA Inhale 2 puffs into the lungs every 6 (six) hours as needed for wheezing or shortness of breath.   apixaban 5 MG Tabs tablet Commonly known as: ELIQUIS Take 1 tablet (5 mg total) by mouth 2 (two) times daily.   ascorbic acid 500 MG tablet Commonly known as: VITAMIN C Take 1 tablet (500 mg total) by mouth daily. Start taking on: February 19, 2020   blood glucose meter kit and supplies Dispense based on patient and insurance preference. Use up to four times daily as directed. (FOR ICD-10 E10.9, E11.9).   fexofenadine-pseudoephedrine 180-240 MG 24 hr tablet Commonly known as: ALLEGRA-D 24 Take 1 tablet by mouth every evening. For allergy and congestion   fluorouracil 5 % cream Commonly known as: EFUDEX APPLY A SMALL AMOUNT OF CREAM TOPICALLY TWICE DAILY TO RIGHT EAR FOR 3 WEEKS   fluticasone 50 MCG/ACT nasal spray Commonly known as: FLONASE USE ONE SPRAY(S) IN EACH NOSTRIL ONCE DAILY   furosemide 40 MG tablet Commonly known as: LASIX Take 40 mg by mouth daily.   insulin NPH Human 100 UNIT/ML injection Commonly known as: NOVOLIN N 30 units sq AC breakfast, 40 units sq ac supper   Insulin Regular Human 100 UNIT/ML Sopn Inject 12 units before lunch and 7 units before supper.   loratadine 10 MG tablet Commonly known as: CLARITIN Take by mouth.   metoprolol tartrate 50 MG tablet Commonly known as: LOPRESSOR Take 1 tablet (50 mg total) by mouth 2 (two) times daily.   mycophenolate 250 MG capsule Commonly known as: CELLCEPT TAKE 2 CAPSULES BY MOUTH TWO TIMES DAILY.   omega-3 acid ethyl esters 1 g capsule Commonly known as: Lovaza Take 2 capsules (2 g total) by mouth 2 (two) times daily.   pravastatin 80 MG tablet Commonly known as: PRAVACHOL TAKE 1 TABLET BY MOUTH ONCE DAILY IN THE MORNING   predniSONE 5 MG tablet Commonly known as: DELTASONE Take 5 mg by mouth daily. What changed: Another medication with the same name was added. Make sure you understand how and when to take each.   predniSONE 10 MG tablet Commonly known as: DELTASONE Take 4 tablets (40 mg total) by mouth daily with breakfast for 3 days, THEN 2 tablets (20 mg total) daily with breakfast for 3 days, THEN 1 tablet (10 mg total) daily with breakfast for 3 days. Then resume chronic 38m daily. Start taking on: February 18, 2020 What changed: You  were already taking a medication with the same name, and this prescription was added. Make sure you understand how and when to take each.   ReliOn Insulin Syringe 31G X 15/64" 0.5 ML Misc Generic drug: Insulin Syringe-Needle U-100 USE AS DIRECTED FOR INSULIN   sodium bicarbonate 650 MG tablet Take 650 mg by mouth 2 (two) times daily.   tacrolimus 0.5 MG capsule Commonly known as: PROGRAF TAKE 2 CAPSULES BY MOUTH TWO TIMES DAILY.   zinc sulfate 220 (50  Zn) MG capsule Take 1 capsule (220 mg total) by mouth daily. Start taking on: February 19, 2020      Allergies  Allergen Reactions  . Other Rash    Use paper tape.   . Tape Rash    Use paper tape.   . Sulfur Rash    The results of significant diagnostics from this hospitalization (including imaging, microbiology, ancillary and laboratory) are listed below for reference.    Significant Diagnostic Studies: DG CHEST PORT 1 VIEW  Result Date: 02/16/2020 CLINICAL DATA:  Diffuse interstitial lung infiltrate. Pleural effusion. EXAM: PORTABLE CHEST 1 VIEW COMPARISON:  February 11, 2020 FINDINGS: Stable cardiomegaly. The hila and mediastinum are unchanged. No pneumothorax. Pulmonary infiltrates are identified centrally bilaterally, right slightly greater than left. Mild haziness more peripherally in the lungs. No other acute abnormalities. IMPRESSION: Predominantly central bilateral pulmonary infiltrates, right slightly greater than left. These findings could represent developing edema/ARDS. An infectious process/pneumonia is not excluded. If there is concern for infection, atypical infections should be considered. Electronically Signed   By: Dorise Bullion III M.D   On: 02/16/2020 12:08   DG Chest Port 1 View  Result Date: 02/11/2020 CLINICAL DATA:  Chest heaviness and shortness of breath. EXAM: PORTABLE CHEST 1 VIEW COMPARISON:  July 30, 2005 FINDINGS: Mild, diffuse, chronic appearing increased interstitial lung markings are seen. There is no  evidence of acute infiltrate, pleural effusion or pneumothorax. The cardiac silhouette is borderline in size and stable in appearance. The visualized skeletal structures are unremarkable. IMPRESSION: Chronic appearing increased lung markings without evidence of acute or active cardiopulmonary disease. Electronically Signed   By: Virgina Norfolk M.D.   On: 02/11/2020 23:42   ECHOCARDIOGRAM COMPLETE  Result Date: 02/12/2020    ECHOCARDIOGRAM REPORT   Patient Name:   GADGE HERMIZ Date of Exam: 02/12/2020 Medical Rec #:  [id number]     Height:       70.0 in Accession #:    [id number]    Weight:       212.3 lb Date of Birth:  1945-06-17     BSA:          2.141 m Patient Age:    53 years      BP:           138/69 mmHg Patient Gender: M             HR:           61 bpm. Exam Location:  [facility] Procedure: 2D Echo, Cardiac Doppler and Color Doppler Indications:    CHF-Acute Diastolic Z32.99  History:        Patient has no prior history of Echocardiogram examinations.                 Risk Factors:Hypertension, Dyslipidemia and Diabetes. COVID-19                 virus infection, Acute respiratory failure with hypoxia,                 Elevated BNP, Elevated troponin, Renal transplant recipient.  Sonographer:    Alvino Chapel RCS Referring Phys: [id number] OLADAPO ADEFESO IMPRESSIONS  1. Left ventricular ejection fraction, by estimation, is 40 to 45%. The left ventricle has mildly decreased function. The left ventricle demonstrates regional wall motion abnormalities (see scoring diagram/findings for description). The left ventricular  internal cavity size was mildly dilated. There is mild left ventricular hypertrophy. Left ventricular diastolic parameters are suggestive of Grade  III diastolic dysfunction (restrictive).  2. Right ventricular systolic function is normal. The right ventricular size is normal. Tricuspid regurgitation signal is inadequate for assessing PA pressure.  3. Left atrial size was severely dilated.  4.  The mitral valve is grossly normal, mildly calcified. Trivial mitral valve regurgitation.  5. The aortic valve is tricuspid. Aortic valve regurgitation is not visualized.  6. The inferior vena cava is dilated in size with >50% respiratory variability, suggesting right atrial pressure of 8 mmHg. FINDINGS  Left Ventricle: Left ventricular ejection fraction, by estimation, is 40 to 45%. The left ventricle has mildly decreased function. The left ventricle demonstrates regional wall motion abnormalities. The left ventricular internal cavity size was mildly dilated. There is mild left ventricular hypertrophy. Left ventricular diastolic parameters are consistent with Grade III diastolic dysfunction (restrictive).  LV Wall Scoring: The basal inferolateral segment, apical septal segment, basal inferior segment, and apex are akinetic. The antero-lateral wall, mid and distal lateral wall, mid and distal inferior wall, mid anteroseptal segment, and mid inferoseptal segment are hypokinetic. The entire anterior wall, basal anteroseptal segment, and basal inferoseptal segment are normal. Right Ventricle: The right ventricular size is normal. No increase in right ventricular wall thickness. Right ventricular systolic function is normal. Tricuspid regurgitation signal is inadequate for assessing PA pressure. Left Atrium: Left atrial size was severely dilated. Right Atrium: Right atrial size was normal in size. Pericardium: There is no evidence of pericardial effusion. Presence of pericardial fat pad. Mitral Valve: The mitral valve is grossly normal. There is mild calcification of the mitral valve leaflet(s). Trivial mitral valve regurgitation. Tricuspid Valve: The tricuspid valve is grossly normal. Tricuspid valve regurgitation is trivial. Aortic Valve: The aortic valve is tricuspid. There is mild aortic valve annular calcification. Aortic valve regurgitation is not visualized. Pulmonic Valve: The pulmonic valve was not well  visualized. Pulmonic valve regurgitation is trivial. Aorta: The aortic root is normal in size and structure. Venous: The inferior vena cava is dilated in size with greater than 50% respiratory variability, suggesting right atrial pressure of 8 mmHg. IAS/Shunts: No atrial level shunt detected by color flow Doppler.  LEFT VENTRICLE PLAX 2D LVIDd:         6.10 cm      Diastology LVIDs:         4.85 cm      LV e' medial:    5.00 cm/s LV PW:         1.20 cm      LV E/e' medial:  27.0 LV IVS:        1.30 cm      LV e' lateral:   5.55 cm/s LVOT diam:     2.20 cm      LV E/e' lateral: 24.3 LV SV:         74 LV SV Index:   35 LVOT Area:     3.80 cm  LV Volumes (MOD) LV vol d, MOD A2C: 144.5 ml LV vol d, MOD A4C: 161.0 ml LV vol s, MOD A2C: 85.4 ml LV vol s, MOD A4C: 67.1 ml LV SV MOD A2C:     59.1 ml LV SV MOD A4C:     161.0 ml LV SV MOD BP:      77.2 ml RIGHT VENTRICLE RV S prime:     12.00 cm/s TAPSE (M-mode): 2.6 cm LEFT ATRIUM              Index       RIGHT ATRIUM  Index LA diam:        3.90 cm  1.82 cm/m  RA Area:     20.00 cm LA Vol (A2C):   116.0 ml 54.18 ml/m RA Volume:   57.40 ml  26.81 ml/m LA Vol (A4C):   119.0 ml 55.58 ml/m LA Biplane Vol: 118.0 ml 55.12 ml/m  AORTIC VALVE LVOT Vmax:   86.70 cm/s LVOT Vmean:  62.000 cm/s LVOT VTI:    0.195 m  AORTA Ao Root diam: 3.70 cm MITRAL VALVE MV Area (PHT): 5.27 cm     SHUNTS MV Decel Time: 144 msec     Systemic VTI:  0.20 m MV E velocity: 135.00 cm/s  Systemic Diam: 2.20 cm MV A velocity: 46.50 cm/s MV E/A ratio:  2.90 Rozann Lesches MD Electronically signed by Rozann Lesches MD Signature Date/Time: 02/12/2020/4:58:17 PM    Final     Microbiology: Recent Results (from the past 240 hour(s))  Blood Culture (routine x 2)     Status: None   Collection Time: 02/12/20  1:10 AM   Specimen: BLOOD RIGHT HAND  Result Value Ref Range Status   Specimen Description BLOOD RIGHT HAND  Final   Special Requests   Final    BOTTLES DRAWN AEROBIC AND ANAEROBIC  Blood Culture adequate volume   Culture   Final    NO GROWTH 5 DAYS Performed at [facility], [address], [place], [place] [address]    Report Status 02/17/2020 FINAL  Final  Blood Culture (routine x 2)     Status: None   Collection Time: 02/12/20  1:10 AM   Specimen: BLOOD RIGHT FOREARM  Result Value Ref Range Status   Specimen Description BLOOD RIGHT FOREARM  Final   Special Requests   Final    BOTTLES DRAWN AEROBIC AND ANAEROBIC Blood Culture adequate volume   Culture   Final    NO GROWTH 5 DAYS Performed at [facility], [address] [place], [place] [address]    Report Status 02/17/2020 FINAL  Final     Labs: Basic Metabolic Panel: Recent Labs  Lab 02/13/20 0145 02/14/20 0405 02/15/20 0203 02/15/20 1450 02/15/20 2003 02/16/20 0412 02/16/20 1556 02/17/20 0236 02/18/20 0341  NA 130* 125* 122*  122*   < > 122* 126* 126* 127* 133*  K 4.8 4.5 4.4  --   --  4.3 4.1 3.5 3.6  CL 99 95* 94*  --   --  97* 94* 95* 97*  CO2 21* 19* 17*  --   --  20* 20* 22 25  GLUCOSE 123* 144* 201*  --   --  147* 144* 167* 113*  BUN 40* 51* 57*  --   --  58* 61* 60* 58*  CREATININE 1.84* 1.87* 1.85*  --   --  1.88* 1.96* 1.89* 2.13*  CALCIUM 8.1* 7.9* 7.6*  --   --  7.5* 7.5* 7.4* 7.8*  MG 2.2 2.4 2.4  --   --  2.7*  --  2.4  --   PHOS 3.0 4.5 4.4  --   --  4.2  --  2.9  --    < > = values in this interval not displayed.   Liver Function Tests: Recent Labs  Lab 02/13/20 0145 02/14/20 0405 02/15/20 0203 02/16/20 0412 02/17/20 0236  AST 66* 90* 75* 67* 59*  ALT 24 36 37 39 39  ALKPHOS 39 44 37* 34* 36*  BILITOT 1.0 0.9 0.9 0.7 1.2  PROT 5.5* 6.3* 5.5*  5.1* 5.1*  ALBUMIN 2.9* 3.4* 2.9* 2.7* 2.6*   CBC: Recent Labs  Lab 02/13/20 0145 02/14/20 0405 02/15/20 0203 02/16/20 0412 02/17/20 0236  WBC 5.6 14.3* 7.5 6.5 6.8  NEUTROABS 4.8 13.0* 6.7 5.3 5.5  HGB 13.6 14.6 13.7 12.7* 13.0  HCT 39.8 43.7 37.7* 35.3* 37.1*  MCV 84.5 85.0 83.2 82.9 82.3  PLT 85* 148* 113* 105*  129*    Recent Labs    02/11/20 2330  BNP 842.0*   CBG: Recent Labs  Lab 02/17/20 1609 02/17/20 2040 02/18/20 0023 02/18/20 0626 02/18/20 1213  GLUCAP 278* 431* 323* 100* 263*    Principal Problem:   NSTEMI (non-ST elevated myocardial infarction) ([place]) Active Problems:   Essential hypertension   Insulin dependent type 2 diabetes mellitus ([place])   Renal transplant recipient   Mixed hyperlipidemia   Hyponatremia   Pneumonia due to COVID-19 virus   Hyperglycemia due to diabetes mellitus (HCC)   Acute respiratory failure with hypoxia (HCC)   CKD (chronic kidney disease), stage III (HCC)   Systolic and diastolic CHF, acute (HCC)   Thrombocytopenia ([place])   AKI (acute kidney injury) ([place])   Unspecified atrial fibrillation ([place])   Time coordinating discharge: 35 minutes  Signed:  Murray Hodgkins, MD  [facility]  02/18/2020, 1:09 PM

## 2020-02-18 NOTE — TOC Benefit Eligibility Note (Signed)
Transition of Care [facility]) Benefit Eligibility Note    Patient Details  Name: Daniel Myers MRN: [id number] Date of Birth: 28-Feb-1945   Medication/Dose: Eliquis 5mg . bid for 30 day supply  Covered?: Yes  Tier: 3 Drug  Prescription Coverage Preferred Pharmacy: [facility] with Person/Company/Phone Number:: Esau Grew [employer] Ph# [phone]  Co-Pay: $30.00  Prior Approval: No  Deductible:  (No Deductible)       Shelda Altes Phone Number: 02/18/2020, 10:32 AM

## 2020-02-18 NOTE — TOC Transition Note (Signed)
Transition of Care [facility]) - CM/SW Discharge Note   Patient Details  Name: Daniel Myers MRN: [id number] Date of Birth: September 24, 1945  Transition of [facility]) CM/SW Contact:  Pollie Friar, RN Phone Number: 02/18/2020, 1:45 PM   Clinical Narrative:    Pt is discharging home with self care. Pt provided 30 day free card for Eliquis. His monthly co pay will be $30.  Pt has transportation home.    Final next level of care: Home/Self Care Barriers to Discharge: No Barriers Identified   Patient Goals and CMS Choice        Discharge Placement                       Discharge Plan and Services                                     Social Determinants of Health (SDOH) Interventions     Readmission Risk Interventions No flowsheet data found.

## 2020-02-18 NOTE — Progress Notes (Addendum)
Progress Note  Patient Name: Daniel Myers Date of Encounter: 02/18/2020  [facility] Cardiologist: Rozann Lesches, MD   Subjective   Talked with the patient over the phone, reports he is feeling much better. Breathing has much improved. No chest pain.   Inpatient Medications    Scheduled Meds: . aerochamber Z-Stat Plus/medium  1 each Other Once  . apixaban  5 mg Oral BID  . vitamin C  500 mg Oral Daily  . aspirin EC  81 mg Oral Daily  . atorvastatin  80 mg Oral Daily  . furosemide  80 mg Intravenous BID  . insulin aspart  0-20 Units Subcutaneous Q4H  . insulin detemir  0.075 Units/kg Subcutaneous BID  . linagliptin  5 mg Oral Daily  . metoprolol tartrate  25 mg Oral TID  . mycophenolate  500 mg Oral BID  . pantoprazole  40 mg Oral Daily  . predniSONE  50 mg Oral Daily  . tacrolimus  1 mg Oral BID  . zinc sulfate  220 mg Oral Daily   Continuous Infusions:  PRN Meds: acetaminophen, albuterol, chlorpheniramine-HYDROcodone, guaiFENesin-dextromethorphan, LORazepam, ondansetron **OR** ondansetron (ZOFRAN) IV   Vital Signs    Vitals:   02/17/20 1606 02/17/20 2036 02/18/20 0000 02/18/20 0622  BP: (!) 142/93 (!) 149/113 (!) 140/91 (!) 151/93  Pulse:  97 (!) 105 (!) 109  Resp: 18   18  Temp: 98.5 F (36.9 C) 98.5 F (36.9 C) 97.6 F (36.4 C) 98.2 F (36.8 C)  TempSrc: Oral Oral  Oral  SpO2: 96% 95% 92% 99%  Weight:    96 kg  Height:        Intake/Output Summary (Last 24 hours) at 02/18/2020 1133 Last data filed at 02/18/2020 0920 Gross per 24 hour  Intake 480 ml  Output 3275 ml  Net -2795 ml   Last 3 Weights 02/18/2020 02/17/2020 02/16/2020  Weight (lbs) 211 lb 10.3 oz 216 lb 6.4 oz 222 lb 4.8 oz  Weight (kg) 96 kg 98.158 kg 100.835 kg      Telemetry    Afib rates in the 100s - Personally Reviewed  ECG    No new tracing this morning - Personally Reviewed  Physical Exam   Physical exam per MD.  Labs    High Sensitivity Troponin:   Recent Labs   Lab 02/12/20 0110 02/12/20 0520 02/12/20 0742 02/12/20 0904 02/12/20 1029  TROPONINIHS 395* 4,887* 8,490* 8,393* 8,647*      Chemistry Recent Labs  Lab 02/15/20 0203 02/15/20 1450 02/16/20 0412 02/16/20 1556 02/17/20 0236 02/18/20 0341  NA 122*  122*   < > 126* 126* 127* 133*  K 4.4  --  4.3 4.1 3.5 3.6  CL 94*  --  97* 94* 95* 97*  CO2 17*  --  20* 20* 22 25  GLUCOSE 201*  --  147* 144* 167* 113*  BUN 57*  --  58* 61* 60* 58*  CREATININE 1.85*  --  1.88* 1.96* 1.89* 2.13*  CALCIUM 7.6*  --  7.5* 7.5* 7.4* 7.8*  PROT 5.5*  --  5.1*  --  5.1*  --   ALBUMIN 2.9*  --  2.7*  --  2.6*  --   AST 75*  --  67*  --  59*  --   ALT 37  --  39  --  39  --   ALKPHOS 37*  --  34*  --  36*  --   BILITOT 0.9  --  0.7  --  1.2  --   GFRNONAA 38*  --  37* 35* 37* 32*  ANIONGAP 11  --  9 12 10 11    < > = values in this interval not displayed.     Hematology Recent Labs  Lab 02/15/20 0203 02/16/20 0412 02/17/20 0236  WBC 7.5 6.5 6.8  RBC 4.53 4.26 4.51  HGB 13.7 12.7* 13.0  HCT 37.7* 35.3* 37.1*  MCV 83.2 82.9 82.3  MCH 30.2 29.8 28.8  MCHC 36.3* 36.0 35.0  RDW 12.9 12.8 12.7  PLT 113* 105* 129*    BNP Recent Labs  Lab 02/11/20 2330  BNP 842.0*     DDimer  Recent Labs  Lab 02/15/20 0203 02/16/20 0412 02/17/20 0236  DDIMER 0.59* 0.63* 1.13*     Radiology    No results found.  Cardiac Studies   Echo: 02/12/20  IMPRESSIONS    1. Left ventricular ejection fraction, by estimation, is 40 to 45%. The  left ventricle has mildly decreased function. The left ventricle  demonstrates regional wall motion abnormalities (see scoring  diagram/findings for description). The left ventricular  internal cavity size was mildly dilated. There is mild left ventricular  hypertrophy. Left ventricular diastolic parameters are suggestive of Grade  III diastolic dysfunction (restrictive).  2. Right ventricular systolic function is normal. The right ventricular  size is  normal. Tricuspid regurgitation signal is inadequate for assessing  PA pressure.  3. Left atrial size was severely dilated.  4. The mitral valve is grossly normal, mildly calcified. Trivial mitral  valve regurgitation.  5. The aortic valve is tricuspid. Aortic valve regurgitation is not  visualized.  6. The inferior vena cava is dilated in size with >50% respiratory  variability, suggesting right atrial pressure of 8 mmHg.   Patient Profile     75 y.o. male  with a hx of HTN, HLD, DM2,obesity,and CKD with history of renal transplantation on immunosuppressive therapywhowas seenfor the evaluation of chest pain and elevated troponinsat the request ofDr. BMWUXL Dr. Domenic Polite while at [facility] and transferred to [facility] for further management.Diagnosed with COVID PNA and acute hypoxic respiratory. Now with new onset afib.   Assessment & Plan    1. NSTEMI with abnormal KGM:WNUU peaked at 8490 in the setting of COVID-19 infection. Did have episodes of chest pain prior to admission. Echo with EF of 40-45% with basal, inferolateral and apex are akinetic. Has been treated medically with IV heparin (plan for 48 hours), ASA, statin and BB therapy. Will need ischemic work up once recovered either later this admission or as an outpatient pending clinical progression.   2. Acute combined systolic and diastolic HF:EF noted at 72-53% on echo. Has been on low dose metoprolol 25mg  TID. Unable to add ACEi/ARB with renal dysfunction. Weight is trending down, net - 6.7L. Has been diuresing with 80mg  IV lasix BID. Cr is up this morning, will defer management to nephrology.   3. Acute respiratory failure in the setting of COVID: has been weaned to room air. Treated with remdesivir and steroids per primary.  4. GUY:QIHKVQQV today. Would further titrate metoprolol to 50mg  BID.  5. DM:Hgb A1c 6.9 -- on SSI, Levemir  6. ZDG:LOVFIEPP from pravastatin to high dose statin. Monitor LFTs as they have been  slightly elevated this admission. -- last lipids (11/21)- LDL 125, HDL 34  7. Hx of right renal transplant:Cr baseline around 1.4-1.5. Has been steady at 1.8 this admission, bumped to 2.13 this morning.  -- on prograf  8. Hyponatremia: Improved Na+133 this morning. This is in the setting of COVID, volume overload and hx of renal transplant.  -- nephrology following  9. New onset Afib: in the setting of COVID PNA. Has been started on Eliquis 5mg  BID, will further increase metoprolol to 50mg  BID.  -- CHA2DS2-VASc Score of at least 4.  For questions or updates, please contact [place] Please consult [email or web address] for contact info under   Signed, Reino Bellis, NP  02/18/2020, 11:33 AM    I have examined the patient and reviewed assessment and plan and discussed with patient and his daughter.  Due to COVID infection, we spoke over the phone.  Agree with above as stated.  No chest pain.  ARF.  Increase metoprolol to 50 mg BID.   Will need to be switched to oral diuretics.  Nephrology following.  Patient's daughter wants him to see his transplant nephrologist at [facility].  THey would like to f/u in our [place] office rather than seeing an MD in [place].  Could possibly see Dr. Domenic Polite in [place] since Dr. Domenic Polite saw him in the hospital.   Eliquis for stroke prevention.  Metoprolol for rate control.  Oral Lasix.  Likely lexiscan cardioluite as an outpatient since he has been stable from ischemic standpoint in the hospital.   Discussed plan with Dr. Sarajane Jews.  Will arrange f/u.    Larae Grooms

## 2020-02-18 NOTE — Progress Notes (Signed)
Nephrology Follow-Up Consult note   Assessment/Recommendations: Daniel Myers is a/an 75 y.o. male with a past medical history significant for HTN, HLD, DM two, obesity, CKD, renal transplant (2008)  who present w/ COVID, NSTEMI, hyponatremia  Hyponatremia: Urine lites on 1/8 w/ urine na of 13 which is not c/w SIADH. Most likely related to CHF.  Serum sodium improved to 133 today -Continue diuretics -Could consider tolvaptan or urea if Na fails to improve -lasix switched to PO -Counseled to limit fluids today  Non-Oliguric AKI on CKD w/ h/o Renal transplant: BL Crt 1.5, h/o tx in 2008 w/ biopsy in November w/o rejection (reportedly proteinuria that has since resolved). Mild AKI at this time, unclear cause but likely associated w/ COVID.  Creatinine slightly up today likely related to diuretics (switching to PO) -Diuresis as above -Continue immunosuppression as ordered  -Continue to monitor daily Cr, Dose meds for GFR -Monitor Daily I/Os, Daily weight  -Maintain MAP>65 for optimal renal perfusion.  -Avoid nephrotoxic medications including NSAIDs and Vanc/Zosyn combo  CHF exacerbation: concern for volume overload.  Weight improving. -transition to PO -Cardiology following; appreicate help  NSTEMI: w/ slight decrease in EF. Treated medically. Plans for eval in the future once patient stabilizes. Cardiology following  HTN: cont norvasc and metop, lasix as above  COVID: treatment per primary; O2 status improving   [facility] 02/18/2020 12:53 PM  ___________________________________________________________  CC: Hyponatremia, h/o renal transplant, ckd  Interval History/Subjective: no acute events. Patient reports that his breathing has improved. Na better but Cr up to 2.1. he's eager to go home.   Medications:  Current Facility-Administered Medications  Medication Dose Route Frequency Provider Last Rate Last Admin  . acetaminophen (TYLENOL) tablet  650 mg  650 mg Oral Q6H PRN Adefeso, Oladapo, DO      . aerochamber Z-Stat Plus/medium 1 each  1 each Other Once Adefeso, Oladapo, DO      . albuterol (VENTOLIN HFA) 108 (90 Base) MCG/ACT inhaler 2 puff  2 puff Inhalation Q6H PRN Manuella Ghazi, Pratik D, DO   2 puff at 02/16/20 1045  . apixaban (ELIQUIS) tablet 5 mg  5 mg Oral BID Lyndee Leo, RPH   5 mg at 02/18/20 4259  . ascorbic acid (VITAMIN C) tablet 500 mg  500 mg Oral Daily Adefeso, Oladapo, DO   500 mg at 02/18/20 0927  . aspirin EC tablet 81 mg  81 mg Oral Daily Satira Sark, MD   81 mg at 02/18/20 5638  . atorvastatin (LIPITOR) tablet 80 mg  80 mg Oral Daily Reino Bellis B, NP   80 mg at 02/18/20 7564  . chlorpheniramine-HYDROcodone (TUSSIONEX) 10-8 MG/5ML suspension 5 mL  5 mL Oral Q12H PRN Adefeso, Oladapo, DO   5 mL at 02/16/20 1024  . furosemide (LASIX) injection 80 mg  80 mg Intravenous BID Reesa Chew, MD   80 mg at 02/18/20 3329  . guaiFENesin-dextromethorphan (ROBITUSSIN DM) 100-10 MG/5ML syrup 10 mL  10 mL Oral Q4H PRN Adefeso, Oladapo, DO   10 mL at 02/17/20 1620  . insulin aspart (novoLOG) injection 0-20 Units  0-20 Units Subcutaneous Q4H Shah, Pratik D, DO   11 Units at 02/18/20 1226  . insulin detemir (LEVEMIR) injection 7 Units  0.075 Units/kg Subcutaneous BID Adefeso, Oladapo, DO   7 Units at 02/18/20 0927  . linagliptin (TRADJENTA) tablet 5 mg  5 mg Oral Daily Adefeso, Oladapo, DO   5 mg at 02/18/20 0927  . LORazepam (  ATIVAN) tablet 0.5 mg  0.5 mg Oral Q8H PRN Samuella Cota, MD      . metoprolol tartrate (LOPRESSOR) tablet 50 mg  50 mg Oral BID Reino Bellis B, NP      . mycophenolate (CELLCEPT) capsule 500 mg  500 mg Oral BID Opyd, Ilene Qua, MD   500 mg at 02/18/20 0926  . ondansetron (ZOFRAN) tablet 4 mg  4 mg Oral Q6H PRN Adefeso, Oladapo, DO       Or  . ondansetron (ZOFRAN) injection 4 mg  4 mg Intravenous Q6H PRN Adefeso, Oladapo, DO      . pantoprazole (PROTONIX) EC tablet 40 mg  40 mg Oral  Daily Adefeso, Oladapo, DO   40 mg at 02/18/20 0927  . predniSONE (DELTASONE) tablet 50 mg  50 mg Oral Daily Adefeso, Oladapo, DO   50 mg at 02/18/20 0927  . tacrolimus (PROGRAF) capsule 1 mg  1 mg Oral BID Opyd, Ilene Qua, MD   1 mg at 02/18/20 4818  . zinc sulfate capsule 220 mg  220 mg Oral Daily Adefeso, Oladapo, DO   220 mg at 02/18/20 0927        Physical Exam: Vitals:   02/18/20 0000 02/18/20 0622  BP: (!) 140/91 (!) 151/93  Pulse: (!) 105 (!) 109  Resp:  18  Temp: 97.6 F (36.4 C) 98.2 F (36.8 C)  SpO2: 92% 99%   Total I/O In: -  Out: 500 [Urine:500]  Intake/Output Summary (Last 24 hours) at 02/18/2020 1253 Last data filed at 02/18/2020 0920 Gross per 24 hour  Intake 480 ml  Output 3275 ml  Net -2795 ml   Gen: NAD, laying flat in bed CV: reg rate Resp: normal WOB Abd: nondistended Ext: no edema Neuro: speech clear and coherent   Test Results I personally reviewed new and old clinical labs and radiology tests Lab Results  Component Value Date   NA 133 (L) 02/18/2020   K 3.6 02/18/2020   CL 97 (L) 02/18/2020   CO2 25 02/18/2020   BUN 58 (H) 02/18/2020   CREATININE 2.13 (H) 02/18/2020   CALCIUM 7.8 (L) 02/18/2020   ALBUMIN 2.6 (L) 02/17/2020   PHOS 2.9 02/17/2020

## 2020-02-18 NOTE — Plan of Care (Signed)
DISCHARGE NOTE HOME [facility] to be discharged home per MD order. Discussed prescriptions and follow up appointments with the patient. Medication list explained in detail. Patient verbalized understanding.  Skin clean, dry and intact without evidence of skin break down, no evidence of skin tears noted. IV catheter discontinued intact. Site without signs and symptoms of complications. Dressing and pressure applied. Pt denies pain at the site currently. No complaints noted.  Patient free of lines, drains, and wounds.   An After Visit Summary (AVS) was printed and given to the patient. Patient escorted via wheelchair, and discharged home via private auto.  Stephan Minister, RN

## 2020-02-27 ENCOUNTER — Telehealth: Payer: Self-pay

## 2020-02-27 NOTE — Telephone Encounter (Signed)
Appt made and daughter aware

## 2020-02-29 ENCOUNTER — Encounter: Payer: Self-pay | Admitting: Family Medicine

## 2020-02-29 ENCOUNTER — Ambulatory Visit (INDEPENDENT_AMBULATORY_CARE_PROVIDER_SITE_OTHER): Payer: Medicare Other | Admitting: Family Medicine

## 2020-02-29 ENCOUNTER — Other Ambulatory Visit: Payer: Self-pay

## 2020-02-29 VITALS — BP 140/78 | HR 104 | Ht 70.0 in

## 2020-02-29 DIAGNOSIS — J1282 Pneumonia due to coronavirus disease 2019: Secondary | ICD-10-CM

## 2020-02-29 DIAGNOSIS — U071 COVID-19: Secondary | ICD-10-CM | POA: Diagnosis not present

## 2020-02-29 DIAGNOSIS — I252 Old myocardial infarction: Secondary | ICD-10-CM | POA: Diagnosis not present

## 2020-02-29 DIAGNOSIS — I4891 Unspecified atrial fibrillation: Secondary | ICD-10-CM

## 2020-02-29 NOTE — Progress Notes (Signed)
BP 140/78   Pulse (!) 104   Ht '5\' 10"'  (1.778 m)   SpO2 98%   BMI 30.37 kg/m    Subjective:   Patient ID: Daniel Myers, male    DOB: 11-19-1945, 75 y.o.   MRN: [id number]  HPI: Daniel Myers is a 75 y.o. male presenting on 02/29/2020 for No chief complaint on file.   HPI Went in on 02/11/20 and d/c on the 02/18/20. covid pna and afib and Nstemi. Has appt on Thursday with Dr Leonides Sake with cardiologist.  Patient since leaving the hospital has not had any further shortness of breath or palpitations or chest pains.  He does have follow-up with cardiology next week.  He denies any wheezing or coughing.  The main complaint that he has left is that he is fatigued and his energy is down and just generally feeling weaker but he does feel like that is gradually improving but is still just not to where he was before.  Relevant past medical, surgical, family and social history reviewed and updated as indicated. Interim medical history since our last visit reviewed. Allergies and medications reviewed and updated.  Review of Systems  Constitutional: Positive for fatigue. Negative for chills and fever.  Respiratory: Negative for shortness of breath and wheezing.   Cardiovascular: Negative for chest pain and leg swelling.  Musculoskeletal: Negative for back pain and gait problem.  Skin: Negative for rash.  Neurological: Positive for weakness. Negative for dizziness, light-headedness and numbness.  All other systems reviewed and are negative.   Per HPI unless specifically indicated above   Allergies as of 02/29/2020      Reactions   Other Rash   Use paper tape.    Tape Rash   Use paper tape.    Elemental Sulfur Rash      Medication List       Accurate as of February 29, 2020  4:36 PM. If you have any questions, ask your nurse or doctor.        STOP taking these medications   omega-3 acid ethyl esters 1 g capsule Commonly known as: Lovaza Stopped by: Daniel Kaufmann Dettinger, MD   pravastatin  80 MG tablet Commonly known as: PRAVACHOL Stopped by: Daniel Kaufmann Dettinger, MD     TAKE these medications   albuterol 108 (90 Base) MCG/ACT inhaler Commonly known as: VENTOLIN HFA Inhale 2 puffs into the lungs every 6 (six) hours as needed for wheezing or shortness of breath.   apixaban 5 MG Tabs tablet Commonly known as: ELIQUIS Take 1 tablet (5 mg total) by mouth 2 (two) times daily.   ascorbic acid 500 MG tablet Commonly known as: VITAMIN C Take 1 tablet (500 mg total) by mouth daily.   blood glucose meter kit and supplies Dispense based on patient and insurance preference. Use up to four times daily as directed. (FOR ICD-10 E10.9, E11.9).   fexofenadine-pseudoephedrine 180-240 MG 24 hr tablet Commonly known as: ALLEGRA-D 24 Take 1 tablet by mouth every evening. For allergy and congestion   fluorouracil 5 % cream Commonly known as: EFUDEX APPLY A SMALL AMOUNT OF CREAM TOPICALLY TWICE DAILY TO RIGHT EAR FOR 3 WEEKS   fluticasone 50 MCG/ACT nasal spray Commonly known as: FLONASE USE ONE SPRAY(S) IN EACH NOSTRIL ONCE DAILY   furosemide 40 MG tablet Commonly known as: LASIX Take 40 mg by mouth daily.   insulin NPH Human 100 UNIT/ML injection Commonly known as: NOVOLIN N 30 units sq AC breakfast, 40  units sq ac supper   Insulin Regular Human 100 UNIT/ML Sopn Inject 12 units before lunch and 7 units before supper.   loratadine 10 MG tablet Commonly known as: CLARITIN Take by mouth.   metoprolol tartrate 50 MG tablet Commonly known as: LOPRESSOR Take 1 tablet (50 mg total) by mouth 2 (two) times daily.   mycophenolate 250 MG capsule Commonly known as: CELLCEPT TAKE 2 CAPSULES BY MOUTH TWO TIMES DAILY.   predniSONE 5 MG tablet Commonly known as: DELTASONE Take 5 mg by mouth daily.   ReliOn Insulin Syringe 31G X 15/64" 0.5 ML Misc Generic drug: Insulin Syringe-Needle U-100 USE AS DIRECTED FOR INSULIN   sodium bicarbonate 650 MG tablet Take 650 mg by mouth 2  (two) times daily.   tacrolimus 0.5 MG capsule Commonly known as: PROGRAF TAKE 2 CAPSULES BY MOUTH TWO TIMES DAILY.   zinc sulfate 220 (50 Zn) MG capsule Take 1 capsule (220 mg total) by mouth daily.        Objective:   BP 140/78   Pulse (!) 104   Ht '5\' 10"'  (1.778 m)   SpO2 98%   BMI 30.37 kg/m   Wt Readings from Last 3 Encounters:  02/18/20 211 lb 10.3 oz (96 kg)  01/02/20 210 lb 6 oz (95.4 kg)  10/02/19 208 lb 3.2 oz (94.4 kg)    Physical Exam Vitals and nursing note reviewed.  Constitutional:      General: He is not in acute distress.    Appearance: He is well-developed and well-nourished. He is not diaphoretic.  Eyes:     General: No scleral icterus.    Extraocular Movements: EOM normal.     Conjunctiva/sclera: Conjunctivae normal.  Neck:     Thyroid: No thyromegaly.  Cardiovascular:     Rate and Rhythm: Normal rate and regular rhythm.     Pulses: Intact distal pulses.     Heart sounds: Normal heart sounds. No murmur heard.   Pulmonary:     Effort: Pulmonary effort is normal. No respiratory distress.     Breath sounds: Normal breath sounds. No wheezing.  Musculoskeletal:        General: No edema. Normal range of motion.     Cervical back: Neck supple.  Lymphadenopathy:     Cervical: No cervical adenopathy.  Skin:    General: Skin is warm and dry.     Findings: No rash.  Neurological:     Mental Status: He is alert and oriented to person, place, and time.     Coordination: Coordination normal.  Psychiatric:        Mood and Affect: Mood and affect normal.        Behavior: Behavior normal.       Assessment & Plan:   Problem List Items Addressed This Visit      Respiratory   Pneumonia due to COVID-19 virus - Primary   Relevant Orders   CBC with Differential/Platelet   CMP14+EGFR    Other Visit Diagnoses    History of non-ST elevation myocardial infarction (NSTEMI)       Atrial fibrillation, new onset ([place])          Patient is doing a lot  better, he already has enough of anticoagulant and beta-blocker and is going to see the cardiologist next week.  He will follow-up with PCP in 1 month. Follow up plan: Return if symptoms worsen or fail to improve, for 1 month, already has appointment.  Counseling provided for all of  the vaccine components No orders of the defined types were placed in this encounter.   Caryl Pina, MD [facility] 02/29/2020, 4:36 PM

## 2020-03-01 LAB — CBC WITH DIFFERENTIAL/PLATELET
Basophils Absolute: 0 10*3/uL (ref 0.0–0.2)
Basos: 0 %
EOS (ABSOLUTE): 0.1 10*3/uL (ref 0.0–0.4)
Eos: 2 %
Hematocrit: 43.2 % (ref 37.5–51.0)
Hemoglobin: 14.2 g/dL (ref 13.0–17.7)
Immature Grans (Abs): 0 10*3/uL (ref 0.0–0.1)
Immature Granulocytes: 1 %
Lymphocytes Absolute: 1.4 10*3/uL (ref 0.7–3.1)
Lymphs: 26 %
MCH: 28.3 pg (ref 26.6–33.0)
MCHC: 32.9 g/dL (ref 31.5–35.7)
MCV: 86 fL (ref 79–97)
Monocytes Absolute: 0.4 10*3/uL (ref 0.1–0.9)
Monocytes: 8 %
Neutrophils Absolute: 3.4 10*3/uL (ref 1.4–7.0)
Neutrophils: 63 %
Platelets: 100 10*3/uL — CL (ref 150–450)
RBC: 5.01 x10E6/uL (ref 4.14–5.80)
RDW: 12.9 % (ref 11.6–15.4)
WBC: 5.4 10*3/uL (ref 3.4–10.8)

## 2020-03-01 LAB — CMP14+EGFR
ALT: 24 IU/L (ref 0–44)
AST: 30 IU/L (ref 0–40)
Albumin/Globulin Ratio: 1.8 (ref 1.2–2.2)
Albumin: 3.5 g/dL — ABNORMAL LOW (ref 3.7–4.7)
Alkaline Phosphatase: 65 IU/L (ref 44–121)
BUN/Creatinine Ratio: 19 (ref 10–24)
BUN: 26 mg/dL (ref 8–27)
Bilirubin Total: 0.8 mg/dL (ref 0.0–1.2)
CO2: 24 mmol/L (ref 20–29)
Calcium: 8.5 mg/dL — ABNORMAL LOW (ref 8.6–10.2)
Chloride: 100 mmol/L (ref 96–106)
Creatinine, Ser: 1.35 mg/dL — ABNORMAL HIGH (ref 0.76–1.27)
GFR calc Af Amer: 60 mL/min/{1.73_m2} (ref >59)
GFR calc non Af Amer: 52 mL/min/{1.73_m2} — ABNORMAL LOW (ref >59)
Globulin, Total: 1.9 g/dL (ref 1.5–4.5)
Glucose: 111 mg/dL — ABNORMAL HIGH (ref 65–99)
Potassium: 4.1 mmol/L (ref 3.5–5.2)
Sodium: 138 mmol/L (ref 134–144)
Total Protein: 5.4 g/dL — ABNORMAL LOW (ref 6.0–8.5)

## 2020-03-04 ENCOUNTER — Telehealth: Payer: Self-pay

## 2020-03-04 ENCOUNTER — Telehealth (INDEPENDENT_AMBULATORY_CARE_PROVIDER_SITE_OTHER): Payer: Medicare Other | Admitting: Family

## 2020-03-04 ENCOUNTER — Encounter: Payer: Self-pay | Admitting: Family

## 2020-03-04 VITALS — BP 123/55 | HR 94

## 2020-03-04 DIAGNOSIS — R531 Weakness: Secondary | ICD-10-CM

## 2020-03-04 DIAGNOSIS — I10 Essential (primary) hypertension: Secondary | ICD-10-CM

## 2020-03-04 DIAGNOSIS — I4819 Other persistent atrial fibrillation: Secondary | ICD-10-CM

## 2020-03-04 DIAGNOSIS — Z940 Kidney transplant status: Secondary | ICD-10-CM

## 2020-03-04 DIAGNOSIS — N1831 Chronic kidney disease, stage 3a: Secondary | ICD-10-CM | POA: Diagnosis not present

## 2020-03-04 DIAGNOSIS — U071 COVID-19: Secondary | ICD-10-CM | POA: Diagnosis not present

## 2020-03-04 DIAGNOSIS — J1282 Pneumonia due to coronavirus disease 2019: Secondary | ICD-10-CM

## 2020-03-04 DIAGNOSIS — G4700 Insomnia, unspecified: Secondary | ICD-10-CM

## 2020-03-04 MED ORDER — TRAZODONE HCL 50 MG PO TABS
50.0000 mg | ORAL_TABLET | Freq: Every evening | ORAL | 3 refills | Status: DC | PRN
Start: 2020-03-04 — End: 2020-03-11

## 2020-03-04 MED ORDER — METOPROLOL TARTRATE 25 MG PO TABS: 25.0000 mg | ORAL_TABLET | Freq: Two times a day (BID) | ORAL | 3 refills | Status: DC

## 2020-03-04 NOTE — Telephone Encounter (Signed)
Can we call patient and let them know. May need to refax to [facility].

## 2020-03-04 NOTE — Progress Notes (Signed)
Virtual Visit via telephone Note Due to COVID-19 pandemic this visit was conducted virtually. This visit type was conducted due to national recommendations for restrictions regarding the COVID-19 Pandemic (e.g. social distancing, sheltering in place) in an effort to limit this patient's exposure and mitigate transmission in our community. All issues noted in this document were discussed and addressed.  A physical exam was not performed with this format.  I connected with Daniel Myers on 03/04/20 at 3:10 pm  by telephone and verified that I am speaking with the correct person using two identifiers. Daniel Myers is currently located at home and daughter is currently with him  during visit. The provider, Evelina Dun, FNP is located in their office at time of visit.  I discussed the limitations, risks, security and privacy concerns of performing an evaluation and management service by telephone and the availability of in person appointments. I also discussed with the patient that there may be a patient responsible charge related to this service. The patient expressed understanding and agreed to proceed.   History and Present Illness:  Pt calls the office today with complaints of weakness. He was diagnosed with COVID pneumonia. He was admitted to 02/11/20-02/18/20.  While hospitalized he had a NSTEMI. His medication was from Norvasc to metoprolol 50 mg BID. He had a new diagnosed of A Fib and started on Eliquis 5 mg twice a day. He has a follow up with Cardiologists on Thursday.   His reports weakness and falling over the last two days. He was seen in our office on 02/29/20 for a hospital follow up, but was not having this weakness. He had CBC and CMP at that time.   He has a hx of kidney transplant in 2008.  Hypertension This is a chronic problem. The current episode started more than 1 year ago. The problem has been waxing and waning since onset. The problem is controlled. Associated symptoms  include malaise/fatigue. Pertinent negatives include no peripheral edema or shortness of breath. Risk factors for coronary artery disease include dyslipidemia and obesity. The current treatment provides mild improvement.  Insomnia Primary symptoms: difficulty falling asleep, malaise/fatigue.  The current episode started more than one year. The onset quality is gradual. The problem occurs intermittently. The problem has been waxing and waning since onset. The treatment provided no relief.      Review of Systems  Constitutional: Positive for malaise/fatigue.  Respiratory: Negative for shortness of breath.   Psychiatric/Behavioral: The patient has insomnia.   All other systems reviewed and are negative.    Observations/Objective: No SOB Or distress noted, weakness noted  Assessment and Plan: 1. Essential hypertension - metoprolol tartrate (LOPRESSOR) 25 MG tablet; Take 1 tablet (25 mg total) by mouth 2 (two) times daily.  Dispense: 180 tablet; Refill: 3  2. Persistent atrial fibrillation (HCC) - metoprolol tartrate (LOPRESSOR) 25 MG tablet; Take 1 tablet (25 mg total) by mouth 2 (two) times daily.  Dispense: 180 tablet; Refill: 3  3. Pneumonia due to COVID-19 virus  4. Stage 3a chronic kidney disease ([place])  5. Renal transplant recipient  6. Insomnia, unspecified type - traZODone (DESYREL) 50 MG tablet; Take 1-2 tablets (50-100 mg total) by mouth at bedtime as needed for sleep.  Dispense: 60 tablet; Refill: 3  7. Weakness - For home use only DME 4 wheeled rolling walker with seat ([id number])  We will decrease metoprolol to 25 mg BID from 50 mg  Labs reviewed Keep Cardiologists follow up Will try trazodone  for insomnia, encourage sleep ritual.  Will give walker  Fall preventions discussed  Keep follow up with PCP   I discussed the assessment and treatment plan with the patient. The patient was provided an opportunity to ask questions and all were answered. The patient agreed  with the plan and demonstrated an understanding of the instructions.   The patient was advised to call back or seek an in-person evaluation if the symptoms worsen or if the condition fails to improve as anticipated.  The above assessment and management plan was discussed with the patient. The patient verbalized understanding of and has agreed to the management plan. Patient is aware to call the clinic if symptoms persist or worsen. Patient is aware when to return to the clinic for a follow-up visit. Patient educated on when it is appropriate to go to the emergency department.   Time call ended:  3:32 pm   I provided 22 minutes of non-face-to-face time during this encounter.    Evelina Dun, FNP

## 2020-03-04 NOTE — Telephone Encounter (Signed)
Spoke with patient, he wishes this to be faxed to [facility] instead.  I am at home so if you can get someone to take care of this please.

## 2020-03-05 NOTE — Progress Notes (Signed)
Cardiology Office Note  Date: 03/06/2020   ID: [name], [name] 1945/03/24, MRN [id number]  PCP:  Claretta Fraise, MD  Cardiologist:  Rozann Lesches, MD Electrophysiologist:  None   Chief Complaint: Hospital follow up NSTEMI, Atrial fibrillation  History of Present Illness: Daniel Myers is a 75 y.o. male with a history of NSTEMI, Atrial fibrillation, AKI on CKD, HTN, DM2, HLD, Covid pneumonia, acute respiratory failure with hypoxia, hyponatremia, renal transplant recipient (right), CKD, Thrombocytopenia, Systolic and diastolic HF.   Presented 02/11/2020 AP with SOB, CP, respiratory failure secondary to Covid pneumonia. Positive for NSTEMI and transferred to [facility].  Troponins were; [phone].  He developed volume overload and hyponatremia. Responded well to diuresis and hyponatremia was eventually corrected. He was continuing BB and ASA. Not on ACEI or ARBs d/t renal disease. Ischemic workup being contemplated by cardiology as an outpatient. Hypoxia resolved with tx of Covid and CHF. Echo showed mild LV dysfunction and segmental wall motion abnormalities. Daughter was to arrange follow up with nephrology. He was started on Apixaban and BB was increased for HR control. CHA2DS2-Vasc = 5. Cardiology recommended functional testing after recovery from Covid.  Platelets were 100 recent blood draw at PCP office on 02/29/2020   Saw his PCP yesterday for c/o weakness. He reported falling over the prior two days. Metoprolol was reduced from 50 mg po bid down to 25 mg po bid. He was started on trazodone 50-100 mg po prn sleep due to insomnia. A rolling walker was ordered by PCP. Fall prevention was discussed.  At PCP office  Platelets were 100 on 02/29/2020.  Creatinine had improved since discharge from hospital down to 1.35.  GFR was 52.  PCP had already reviewed these results.   He presents today with his daughter and is sitting in a wheelchair.  States he feels better from the Covid virus  but daughter states he is still feeling weak and recently had falls as mentioned above.  She states  he appears weak on the left side.  He is moving all 4 extremities.  He denies any slurred speech, blurred vision or any other CVA or TIA like symptoms.  He denies any anginal symptoms.  Daughter states when he gets up and moves his heart rate picks up.  His PCP had reduced his metoprolol yesterday from 50 mg p.o. twice daily down to 25 mg p.o. 3 times daily.  Heart rate today on arrival is 104 and irregularly irregular.  He denies any current orthostatic symptoms, PND, orthopnea.  Denies any weight gain.  He is continuing to take his Lasix 40 mg daily.  Weight today is 210.  Denies any lower extremity edema.  He has a pending appointment to see his nephrologist soon.  Past Medical History:  Diagnosis Date  . Basal cell carcinoma 06/27/2013   nodular on left jawline - excision  . Basal cell carcinoma 07/01/2014   nodular on left hawling - CX3+5FU+excision  . Basal cell carcinoma 10/10/2014   nodular on right forearm, middle - tx p bx  . Basal cell carcinoma 02/11/2015   left neck - CX3 + excision  . Basal cell carcinoma 06/14/2016   left jawline - CX3+5FU  . Basal cell carcinoma 02/22/2017   superficial and nodular on left neck - excision  . Basal cell carcinoma 04/11/2018   superficial and nodular on right inferior forearm - CX3+Cautery+5FU  . Chronic kidney disease   . History of renal transplant   . Hyperlipidemia   .  Hypertension   . 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 Squamous cell carcinoma of skin 11/08/2011   right upper forearm - tx p bx  . Squamous cell carcinoma of skin 11/08/2011   left upper forearm - tx p bx  . Squamous cell carcinoma of skin 11/08/2011   left hand -  tx p bx  . 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/27/2013   in situ on left lower back - CX3+5FU  . 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/27/2013   in situ on left forehead - CX3+5FU  . 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/27/2013   in situ on right temple - watch per ST  . 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/27/2013   in situ on right forearm - CX3+5FU  . Squamous cell carcinoma of skin 07/01/2014   well differentiated on right sideburn - CX3+5FU  . Squamous cell carcinoma of skin 07/01/2014   in situ on left shoulder - CX3+5FU  . Squamous cell carcinoma of skin 07/01/2014   in situ on right forearm, proximal - CX3+5FU+Cautery  . Squamous cell carcinoma of skin 07/01/2014   in situ on right forearm, distal - CX3+5FU  . Squamous cell carcinoma of skin 09/30/2015   in situ on posterior left ear - CX3+5FU  . Squamous cell carcinoma of skin 02/22/2017   in situ on right upper arm - CX3+5FU  . Squamous cell carcinoma of skin 02/22/2017   in situ on left upper arm - CX3+5FU  . Squamous cell carcinoma of skin 04/11/2018   in situ on right temple - CX3+5FU  . Squamous cell carcinoma of skin 04/11/2018   in situ on lateral right arm - MOHs  . Squamous cell carcinoma of skin 04/11/2018   in situ on right upper arm - MOHs  . Squamous cell carcinoma of skin 04/11/2018   in situ on left sideburn  . Squamous cell carcinoma of skin 04/11/2018   in situ on right flank - tx p bx  . Squamous cell carcinoma of skin 09/28/2018   in situ on right inner ear - tx p bx  . Squamous cell carcinoma of skin 09/28/2018   in situ on left inner ear - tx p bx  . Squamous cell carcinoma of skin 09/28/2018   in situ on right arm - tx p bx  . Squamous cell carcinoma of skin 03/21/2019   in situ on right antihelix ([place] treated topically)  . Squamous cell carcinoma of skin 03/21/2019   in situ on right neck - CX3+5FU  . Squamous cell carcinoma of skin 03/21/2019   in situ on left outer eye, inf (MOHs done  05/23/2019)  . Type 2 diabetes mellitus ([place])   . Unspecified atrial fibrillation ([address]) 02/17/2020    Past Surgical History:  Procedure Laterality Date  . KIDNEY TRANSPLANT Right     Current Outpatient Medications  Medication Sig Dispense Refill  . albuterol (VENTOLIN HFA) 108 (90 Base) MCG/ACT inhaler Inhale 2 puffs into the lungs every 6 (six) hours as needed for wheezing or shortness of breath. 1 each 0  . apixaban (ELIQUIS) 5 MG TABS tablet Take 1 tablet (5 mg total) by mouth 2 (two) times daily. 60 tablet 1  . ascorbic acid (  VITAMIN C) 500 MG tablet Take 1 tablet (500 mg total) by mouth daily.    . blood glucose meter kit and supplies Dispense based on patient and insurance preference. Use up to four times daily as directed. (FOR ICD-10 E10.9, E11.9). 1 each 0  . fexofenadine-pseudoephedrine (ALLEGRA-D 24) 180-240 MG 24 hr tablet Take 1 tablet by mouth every evening. For allergy and congestion 30 tablet 11  . fluorouracil (EFUDEX) 5 % cream APPLY A SMALL AMOUNT OF CREAM TOPICALLY TWICE DAILY TO RIGHT EAR FOR 3 WEEKS    . fluticasone (FLONASE) 50 MCG/ACT nasal spray USE ONE SPRAY(S) IN EACH NOSTRIL ONCE DAILY 16 g 11  . furosemide (LASIX) 40 MG tablet Take 40 mg by mouth daily.    . insulin NPH Human (NOVOLIN N) 100 UNIT/ML injection 30 units sq AC breakfast, 40 units sq ac supper 30 mL 11  . Insulin Regular Human 100 UNIT/ML SOPN Inject 12 units before lunch and 7 units before supper. 10 mL prn  . loratadine (CLARITIN) 10 MG tablet Take by mouth.    . metoprolol tartrate (LOPRESSOR) 25 MG tablet Take 1 tablet (25 mg total) by mouth 2 (two) times daily. 180 tablet 3  . mycophenolate (CELLCEPT) 250 MG capsule TAKE 2 CAPSULES BY MOUTH TWO TIMES DAILY.    Marland Kitchen predniSONE (DELTASONE) 5 MG tablet Take 5 mg by mouth daily.    Marland Kitchen RELION INSULIN SYRINGE 31G X 15/64" 0.5 ML MISC USE AS DIRECTED FOR INSULIN    . sodium bicarbonate 650 MG tablet Take 650 mg by mouth 2 (two) times daily.    .  tacrolimus (PROGRAF) 0.5 MG capsule TAKE 2 CAPSULES BY MOUTH TWO TIMES DAILY.    . traZODone (DESYREL) 50 MG tablet Take 1-2 tablets (50-100 mg total) by mouth at bedtime as needed for sleep. 60 tablet 3  . zinc sulfate 220 (50 Zn) MG capsule Take 1 capsule (220 mg total) by mouth daily.     No current facility-administered medications for this visit.   Allergies:  Other, Tape, and Elemental sulfur   Social History: The patient  reports that he has been smoking pipe. He has never used smokeless tobacco. He reports that he does not drink alcohol and does not use drugs.   Family History: The patient's family history includes Clotting disorder in his mother; Diabetes in his sister; Hypertension in his sister.   ROS:  Please see the history of present illness. Otherwise, complete review of systems is positive for none.  All other systems are reviewed and negative.   Physical Exam: VS:  BP 130/64   Pulse (!) 104   Ht '5\' 10"'  (1.778 m)   Wt 210 lb (95.3 kg)   SpO2 95%   BMI 30.13 kg/m , BMI Body mass index is 30.13 kg/m.  Wt Readings from Last 3 Encounters:  03/06/20 210 lb (95.3 kg)  02/18/20 211 lb 10.3 oz (96 kg)  01/02/20 210 lb 6 oz (95.4 kg)    General: Patient appears comfortable at rest. Neck: Supple, no elevated JVP or carotid bruits, no thyromegaly. Lungs: Clear to auscultation, nonlabored breathing at rest. Cardiac: Irregularly irregular rate and rhythm, no S3 or significant systolic murmur, no pericardial rub. Extremities: No pitting edema, distal pulses 2+. Skin: Warm and dry. Musculoskeletal: No kyphosis. Neuropsychiatric: Alert and oriented x3, affect grossly appropriate.  ECG:  EKG February 17, 2020 showed atrial fibrillation with rapid ventricular response with premature ventricular or aberrantly conducted complexes rate of 108, inferior  infarct age undetermined, anterolateral infarct, age undetermined.  Recent Labwork: 02/11/2020: B Natriuretic Peptide 842.0 02/17/2020:  Magnesium 2.4 02/29/2020: ALT 24; AST 30; BUN 26; Creatinine, Ser 1.35; Hemoglobin 14.2; Platelets 100; Potassium 4.1; Sodium 138     Component Value Date/Time   CHOL 194 01/02/2020 1001   TRIG 98 02/11/2020 2330   HDL 34 (L) 01/02/2020 1001   CHOLHDL 5.7 (H) 01/02/2020 1001   LDLCALC 125 (H) 01/02/2020 1001    Other Studies Reviewed Today:  Echocardiogram 02/12/2020 1. Left ventricular ejection fraction, by estimation, is 40 to 45%. The left ventricle has mildly decreased function. The left ventricle demonstrates regional wall motion abnormalities (see scoring diagram/findings for description). The left ventricular internal cavity size was mildly dilated. There is mild left ventricular hypertrophy. Left ventricular diastolic parameters are suggestive of Grade III diastolic dysfunction (restrictive). 2. Right ventricular systolic function is normal. The right ventricular size is normal. Tricuspid regurgitation signal is inadequate for assessing PA pressure. 3. Left atrial size was severely dilated. 4. The mitral valve is grossly normal, mildly calcified. Trivial mitral valve regurgitation. 5. The aortic valve is tricuspid. Aortic valve regurgitation is not visualized. 6. The inferior vena cava is dilated in size with >50% respiratory variability, suggesting right atrial pressure of 8 mmHg.  Assessment and Plan:  1. NSTEMI (non-ST elevated myocardial infarction) ([place])   2. Atrial fibrillation, unspecified type ([place])   3. Acute on chronic combined systolic (congestive) and diastolic (congestive) heart failure (HCC)   4. Stage 3 chronic kidney disease, unspecified whether stage 3a or 3b CKD ([place])   5. Hyponatremia    1. NSTEMI (non-ST elevated myocardial infarction) [facility]) Recent hospital admission with NSTEMI with elevated troponins and concomitant Covid pneumonia.  Cardiology was consulted and recommended outpatient ischemia work-up after recovering from Covid pneumonia.  Patient  denies any recent chest pain, pressure, tightness.  Please schedule a Lexiscan stress test.  2. Atrial fibrillation, unspecified type ([place]) He remains in atrial fibrillation today with current rate of 104.  His PCP had reduced his metoprolol from 50 mg p.o. twice daily down to 25 mg twice daily yesterday.  Continue metoprolol 25 mg p.o. twice daily.  Continue Eliquis 5 mg p.o. twice daily.  3. Acute on chronic combined systolic (congestive) and diastolic (congestive) heart failure ([place]) Recent hospital admission with NSTEMI, Covid pneumonia and volume overload.  He was diuresed and did well.  He is maintaining his weight around 210 pounds.  No current dyspnea.  No lower extremity edema.  Continue Lasix 40 mg daily.  4. Stage 3 chronic kidney disease, unspecified whether stage 3a or 3b CKD ([place]) Recent AKI on CKD during hospital stay.Marland Kitchen  He has a follow-up with the nephrologist soon.  Current creatinine is 1.35 with a GFR of 52.  5. Hyponatremia Hyponatremia has been corrected.  Current sodium is 138 on lab work at PCP office.  6. Chest pain, unspecified type Recent NSTEMI/chest pain with concomitant Covid pneumonia and volume overload with new onset atrial fibrillation.  We are ordering a Lexiscan stress test as noted above in #1  Medication Adjustments/Labs and Tests Ordered: Current medicines are reviewed at length with the patient today.  Concerns regarding medicines are outlined above.   Disposition: Follow-up with Dr. Domenic Polite or APP 4 weeks.  Signed, Levell July, NP 03/06/2020 9:39 AM    [facility] [place], [place], [place] [address] Phone: [phone]; Fax: [phone]

## 2020-03-06 ENCOUNTER — Ambulatory Visit (INDEPENDENT_AMBULATORY_CARE_PROVIDER_SITE_OTHER): Payer: Medicare Other | Admitting: Family Medicine

## 2020-03-06 ENCOUNTER — Other Ambulatory Visit: Payer: Self-pay

## 2020-03-06 ENCOUNTER — Telehealth: Payer: Self-pay | Admitting: Family Medicine

## 2020-03-06 ENCOUNTER — Encounter: Payer: Self-pay | Admitting: *Deleted

## 2020-03-06 ENCOUNTER — Encounter: Payer: Self-pay | Admitting: Family Medicine

## 2020-03-06 VITALS — BP 130/64 | HR 104 | Ht 70.0 in | Wt 210.0 lb

## 2020-03-06 DIAGNOSIS — N183 Chronic kidney disease, stage 3 unspecified: Secondary | ICD-10-CM | POA: Diagnosis not present

## 2020-03-06 DIAGNOSIS — I5043 Acute on chronic combined systolic (congestive) and diastolic (congestive) heart failure: Secondary | ICD-10-CM

## 2020-03-06 DIAGNOSIS — R079 Chest pain, unspecified: Secondary | ICD-10-CM

## 2020-03-06 DIAGNOSIS — I214 Non-ST elevation (NSTEMI) myocardial infarction: Secondary | ICD-10-CM

## 2020-03-06 DIAGNOSIS — I4891 Unspecified atrial fibrillation: Secondary | ICD-10-CM

## 2020-03-06 DIAGNOSIS — E871 Hypo-osmolality and hyponatremia: Secondary | ICD-10-CM

## 2020-03-06 NOTE — Telephone Encounter (Signed)
Faxed to [facility]

## 2020-03-06 NOTE — Patient Instructions (Signed)
Your physician recommends that you schedule a follow-up appointment in: [place], NP  Your physician recommends that you continue on your current medications as directed. Please refer to the Current Medication list given to you today.  Your physician has requested that you have a lexiscan myoview. For further information please visit [email or web address]. Please follow instruction sheet, as given.  Thank you for choosing [facility]!!

## 2020-03-06 NOTE — Telephone Encounter (Signed)
Has this been faxed to [facility]?

## 2020-03-06 NOTE — Telephone Encounter (Signed)
Pre-cert Verification for the following procedure    LEXISCAN   DATE:03/14/2020  LOCATION: [facility]

## 2020-03-11 ENCOUNTER — Ambulatory Visit (INDEPENDENT_AMBULATORY_CARE_PROVIDER_SITE_OTHER): Payer: Medicare Other | Admitting: Family Medicine

## 2020-03-11 ENCOUNTER — Encounter: Payer: Self-pay | Admitting: Family Medicine

## 2020-03-11 ENCOUNTER — Ambulatory Visit (INDEPENDENT_AMBULATORY_CARE_PROVIDER_SITE_OTHER): Payer: Medicare Other

## 2020-03-11 ENCOUNTER — Other Ambulatory Visit: Payer: Self-pay

## 2020-03-11 VITALS — BP 136/87 | HR 120 | Temp 97.5°F | Resp 22 | Ht 70.0 in

## 2020-03-11 DIAGNOSIS — T07XXXA Unspecified multiple injuries, initial encounter: Secondary | ICD-10-CM

## 2020-03-11 DIAGNOSIS — W19XXXA Unspecified fall, initial encounter: Secondary | ICD-10-CM | POA: Diagnosis not present

## 2020-03-11 DIAGNOSIS — U071 COVID-19: Secondary | ICD-10-CM | POA: Diagnosis not present

## 2020-03-11 DIAGNOSIS — I4891 Unspecified atrial fibrillation: Secondary | ICD-10-CM | POA: Insufficient documentation

## 2020-03-11 DIAGNOSIS — Z794 Long term (current) use of insulin: Secondary | ICD-10-CM

## 2020-03-11 DIAGNOSIS — M25512 Pain in left shoulder: Secondary | ICD-10-CM

## 2020-03-11 DIAGNOSIS — J1282 Pneumonia due to coronavirus disease 2019: Secondary | ICD-10-CM

## 2020-03-11 DIAGNOSIS — M25552 Pain in left hip: Secondary | ICD-10-CM | POA: Diagnosis not present

## 2020-03-11 DIAGNOSIS — M25572 Pain in left ankle and joints of left foot: Secondary | ICD-10-CM

## 2020-03-11 DIAGNOSIS — I4819 Other persistent atrial fibrillation: Secondary | ICD-10-CM | POA: Insufficient documentation

## 2020-03-11 DIAGNOSIS — Z940 Kidney transplant status: Secondary | ICD-10-CM

## 2020-03-11 DIAGNOSIS — G479 Sleep disorder, unspecified: Secondary | ICD-10-CM

## 2020-03-11 DIAGNOSIS — M25562 Pain in left knee: Secondary | ICD-10-CM | POA: Diagnosis not present

## 2020-03-11 DIAGNOSIS — E119 Type 2 diabetes mellitus without complications: Secondary | ICD-10-CM | POA: Diagnosis not present

## 2020-03-11 DIAGNOSIS — R0602 Shortness of breath: Secondary | ICD-10-CM

## 2020-03-11 DIAGNOSIS — R531 Weakness: Secondary | ICD-10-CM

## 2020-03-11 DIAGNOSIS — I5041 Acute combined systolic (congestive) and diastolic (congestive) heart failure: Secondary | ICD-10-CM | POA: Diagnosis not present

## 2020-03-11 HISTORY — DX: Unspecified atrial fibrillation: I48.91

## 2020-03-11 MED ORDER — LEVOFLOXACIN 500 MG PO TABS: 500.0000 mg | ORAL_TABLET | Freq: Every day | ORAL | 0 refills | Status: DC

## 2020-03-11 MED ORDER — MIRTAZAPINE 15 MG PO TABS: 15.0000 mg | ORAL_TABLET | Freq: Every day | ORAL | 5 refills | Status: DC

## 2020-03-11 MED ORDER — PREDNISONE 10 MG PO TABS: ORAL_TABLET | ORAL | 0 refills | Status: DC

## 2020-03-11 NOTE — Progress Notes (Signed)
Subjective:  Patient ID: Daniel Myers, male    DOB: 1945-05-11  Age: 75 y.o. MRN: [id number]  CC: Left sided weakness   HPI Daniel Myers presents for follow-up from his hospitalization for Covid pneumonia.  While hospitalized he developed atrial fibrillation and had an acute MI.  When he came home he had left-sided weakness.  He still gets short of breath quite easily as well.  He is an insulin-dependent diabetic and had his blood sugar fall to 60 at least on one occasion since being home.  He has had 2 falls.  The first was 10 days ago and the second was 8 days ago.  Both times he landed on his left side.  He hit his head on the second occasion.  Does not note any new symptoms since then.  No known loss of consciousness.  His daughters are here with him.  They are concerned that he may have some fractures on his left side.  He is very swelling at his ankle they say.  They are concerned that the left-sided weakness may have caused his fall and may have been from a stroke.  His daughter [name] is his primary caregiver and she gives the history today.  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), 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), Squamous cell carcinoma of skin (11/08/2011), Squamous cell carcinoma of skin (11/08/2011), Squamous cell carcinoma of skin (11/08/2011), 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), Squamous cell carcinoma of  skin (06/27/2013), 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), 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), 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), 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), 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), 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), Squamous cell carcinoma of skin (09/30/2015), Squamous cell carcinoma of skin (02/22/2017), Squamous cell carcinoma of skin (02/22/2017), Squamous cell carcinoma of skin (04/11/2018), Squamous cell carcinoma of skin (04/11/2018), Squamous cell carcinoma of skin (04/11/2018), Squamous cell carcinoma of skin (04/11/2018), Squamous cell carcinoma of skin (04/11/2018), Squamous cell carcinoma of skin (09/28/2018), Squamous cell carcinoma of skin (09/28/2018), Squamous cell carcinoma of skin (09/28/2018), Squamous cell carcinoma of skin (03/21/2019), Squamous cell carcinoma of skin (03/21/2019), Squamous cell carcinoma of skin (03/21/2019), Type 2 diabetes mellitus ([place]), and Unspecified atrial fibrillation ([place]) (02/17/2020).   He has a past surgical history that includes Kidney transplant (Right).   His family history includes Clotting disorder in his mother; Diabetes in his sister; Hypertension in his sister.He reports that he has been smoking pipe. He has never used smokeless tobacco. He reports that he does not drink alcohol and does not use drugs.    ROS Review of Systems  Constitutional: Negative.   HENT: Negative.   Eyes: Negative for visual disturbance.  Respiratory: Positive for cough (producing purulent sputum) and shortness of breath (sats dropped overnight into the 80s).   Cardiovascular: Positive for leg swelling. Negative for chest pain.  Gastrointestinal: Negative for abdominal pain, diarrhea, nausea and vomiting.  Genitourinary: Negative for difficulty urinating.  Musculoskeletal: Positive for arthralgias (left ankle,  knee, hip and shoulder). Negative for myalgias.  Skin: Positive for wound  (Multiple contusions noted at the left knee.  There is significant bruising at the left costal margin.  Multiple contusions about the left shoulder as well.). Negative for rash.  Neurological: Positive for weakness (left side). Negative for headaches.  Psychiatric/Behavioral: Positive for sleep disturbance.    Objective:  BP 136/87   Pulse (!) 120   Temp (!) 97.5 F (36.4 C) (Temporal)   Resp (!) 22   Ht '5\' 10"'  (1.778 m)   SpO2 92%   BMI 30.13 kg/m   BP Readings from Last 3 Encounters:  03/11/20 136/87  03/06/20 130/64  03/04/20 (!) 123/55    Wt Readings from Last 3 Encounters:  03/06/20 210 lb (95.3 kg)  02/18/20 211 lb 10.3 oz (96 kg)  01/02/20 210 lb 6 oz (95.4 kg)     Physical Exam Constitutional:      General: He is not in acute distress.    Appearance: He is well-developed. He is ill-appearing and toxic-appearing.  HENT:     Head: Normocephalic and atraumatic.     Right Ear: External ear normal.     Left Ear: External ear normal.     Nose: Nose normal.  Eyes:     Conjunctiva/sclera: Conjunctivae normal.     Pupils: Pupils are equal, round, and reactive to light.  Cardiovascular:     Rate and Rhythm: Tachycardia present. Rhythm irregular.     Heart sounds: Normal heart sounds. No murmur heard.   Pulmonary:     Effort: Pulmonary effort is normal. No respiratory distress.     Breath sounds: Wheezing present. No rales.  Abdominal:     Palpations: Abdomen is soft.     Tenderness: There is no abdominal tenderness.  Musculoskeletal:        General: Tenderness (left ankle) present. Normal range of motion.     Cervical back: Normal range of motion and neck supple.     Left lower leg: Edema present.     Comments: Wheel chair bound.  Skin:    General: Skin is warm and dry.  Neurological:     Mental Status: He is alert and oriented to person, place, and time.     Deep Tendon Reflexes: Reflexes are normal and symmetric.  Psychiatric:        Behavior: Behavior  normal.        Thought Content: Thought content normal.        Judgment: Judgment normal.   EKG - unchanged from 01/09 at time of MI, except for V5 ST change  CXR - persistent ground glass appearance in lung fields  Preliminary reading done by Malvin Johns    Assessment & Plan:   Daniel Myers was seen today for left sided weakness.  Diagnoses and all orders for this visit:  Pneumonia due to COVID-19 virus -     CBC with Differential/Platelet -     CMP14+EGFR -     DG Chest 2 View; Future -     predniSONE (DELTASONE) 10 MG tablet; Take 5 daily for 3 days followed by 4,3,2 and 1 for 3 days each. -     levofloxacin (LEVAQUIN) 500 MG tablet; Take 1 tablet (500 mg total) by mouth daily. For 10 days -     Ambulatory referral to [place] Referral to [place], initial encounter -     DG Shoulder Left; Future -  DG Knee 1-2 Views Left; Future -     DG Ankle Complete Left; Future -     DG HIP UNILAT W OR W/O PELVIS 2-3 VIEWS LEFT; Future -     CBC with Differential/Platelet -     CMP14+EGFR -     Ambulatory referral to [place] -     AMB Referral to [place]  Contusion, multiple sites -     DG Shoulder Left; Future -     DG Knee 1-2 Views Left; Future -     DG Ankle Complete Left; Future -     DG HIP UNILAT W OR W/O PELVIS 2-3 VIEWS LEFT; Future -     CBC with Differential/Platelet -     CMP14+EGFR -     Ambulatory referral to [place] -     AMB Referral to Community Care Coordinaton  Insulin dependent type 2 diabetes mellitus (HCC) -     CBC with Differential/Platelet -     CMP14+EGFR -     Ambulatory referral to [place] -     AMB Referral to [place]  Renal transplant recipient -     CBC with Differential/Platelet -     CMP14+EGFR -     Ambulatory referral to [place] -     AMB Referral to [place]  Systolic and diastolic CHF, acute (HCC) -     CBC with  Differential/Platelet -     CMP14+EGFR -     EKG 12-Lead -     Ambulatory referral to [place] -     AMB Referral to [place]  Atrial fibrillation, unspecified type ([place]) -     CBC with Differential/Platelet -     CMP14+EGFR -     EKG 12-Lead -     Ambulatory referral to [place] -     AMB Referral to Community Care Coordinaton  Shortness of breath -     Ambulatory referral to [place] -     AMB Referral to [place]  Atrial fibrillation with RVR ([place]) -     Ambulatory referral to [place] -     AMB Referral to [facility] Care Coordinaton  Sleep disturbance -     mirtazapine (REMERON) 15 MG tablet; Take 1 tablet (15 mg total) by mouth at bedtime. For sleep -     Ambulatory referral to [place] Referral to [place]  Acute left-sided weakness -     MR BRAIN W WO CONTRAST; Future -     Ambulatory referral to [place] Referral to [place]       I have discontinued Truddie Crumble L. Mcneeley's predniSONE and traZODone. I am also having him start on mirtazapine, predniSONE, and levofloxacin. Additionally, I am having him maintain his tacrolimus, sodium bicarbonate, mycophenolate, furosemide, ReliOn Insulin Syringe, loratadine, fexofenadine-pseudoephedrine, fluticasone, insulin NPH Human, fluorouracil, blood glucose meter kit and supplies, Insulin Regular Human, albuterol, apixaban, zinc sulfate, ascorbic acid, and metoprolol tartrate.  Allergies as of 03/11/2020      Reactions   Other Rash   Use paper tape.    Tape Rash   Use paper tape.    Elemental Sulfur Rash      Medication List       Accurate as of March 11, 2020  9:04 PM. If you have any questions, ask your nurse or doctor.  STOP taking these medications   traZODone 50 MG tablet Commonly known as: DESYREL Stopped by: Claretta Fraise, MD     TAKE these medications   albuterol 108 (90 Base) MCG/ACT  inhaler Commonly known as: VENTOLIN HFA Inhale 2 puffs into the lungs every 6 (six) hours as needed for wheezing or shortness of breath.   apixaban 5 MG Tabs tablet Commonly known as: ELIQUIS Take 1 tablet (5 mg total) by mouth 2 (two) times daily.   ascorbic acid 500 MG tablet Commonly known as: VITAMIN C Take 1 tablet (500 mg total) by mouth daily.   blood glucose meter kit and supplies Dispense based on patient and insurance preference. Use up to four times daily as directed. (FOR ICD-10 E10.9, E11.9).   fexofenadine-pseudoephedrine 180-240 MG 24 hr tablet Commonly known as: ALLEGRA-D 24 Take 1 tablet by mouth every evening. For allergy and congestion   fluorouracil 5 % cream Commonly known as: EFUDEX APPLY A SMALL AMOUNT OF CREAM TOPICALLY TWICE DAILY TO RIGHT EAR FOR 3 WEEKS   fluticasone 50 MCG/ACT nasal spray Commonly known as: FLONASE USE ONE SPRAY(S) IN EACH NOSTRIL ONCE DAILY   furosemide 40 MG tablet Commonly known as: LASIX Take 40 mg by mouth daily.   insulin NPH Human 100 UNIT/ML injection Commonly known as: NOVOLIN N 30 units sq AC breakfast, 40 units sq ac supper   Insulin Regular Human 100 UNIT/ML Sopn Inject 12 units before lunch and 7 units before supper.   levofloxacin 500 MG tablet Commonly known as: LEVAQUIN Take 1 tablet (500 mg total) by mouth daily. For 10 days Started by: Claretta Fraise, MD   loratadine 10 MG tablet Commonly known as: CLARITIN Take by mouth.   metoprolol tartrate 25 MG tablet Commonly known as: LOPRESSOR Take 1 tablet (25 mg total) by mouth 2 (two) times daily.   mirtazapine 15 MG tablet Commonly known as: Remeron Take 1 tablet (15 mg total) by mouth at bedtime. For sleep Started by: Claretta Fraise, MD   mycophenolate 250 MG capsule Commonly known as: CELLCEPT TAKE 2 CAPSULES BY MOUTH TWO TIMES DAILY.   predniSONE 10 MG tablet Commonly known as: DELTASONE Take 5 daily for 3 days followed by 4,3,2 and 1 for 3 days  each. What changed:   medication strength  how much to take  how to take this  when to take this  additional instructions Changed by: Claretta Fraise, MD   ReliOn Insulin Syringe 31G X 15/64" 0.5 ML Misc Generic drug: Insulin Syringe-Needle U-100 USE AS DIRECTED FOR INSULIN   sodium bicarbonate 650 MG tablet Take 650 mg by mouth 2 (two) times daily.   tacrolimus 0.5 MG capsule Commonly known as: PROGRAF TAKE 2 CAPSULES BY MOUTH TWO TIMES DAILY.   zinc sulfate 220 (50 Zn) MG capsule Take 1 capsule (220 mg total) by mouth daily.        Follow-up: Return in about 10 days (around 03/21/2020) for pneumonia, CVA, Afib.  Claretta Fraise, M.D.

## 2020-03-12 ENCOUNTER — Telehealth: Payer: Self-pay | Admitting: *Deleted

## 2020-03-12 ENCOUNTER — Ambulatory Visit (INDEPENDENT_AMBULATORY_CARE_PROVIDER_SITE_OTHER): Payer: Medicare Other | Admitting: *Deleted

## 2020-03-12 DIAGNOSIS — I4819 Other persistent atrial fibrillation: Secondary | ICD-10-CM | POA: Diagnosis not present

## 2020-03-12 DIAGNOSIS — I10 Essential (primary) hypertension: Secondary | ICD-10-CM

## 2020-03-12 DIAGNOSIS — N183 Chronic kidney disease, stage 3 unspecified: Secondary | ICD-10-CM

## 2020-03-12 DIAGNOSIS — E119 Type 2 diabetes mellitus without complications: Secondary | ICD-10-CM

## 2020-03-12 DIAGNOSIS — I214 Non-ST elevation (NSTEMI) myocardial infarction: Secondary | ICD-10-CM

## 2020-03-12 DIAGNOSIS — N1831 Chronic kidney disease, stage 3a: Secondary | ICD-10-CM | POA: Diagnosis not present

## 2020-03-12 DIAGNOSIS — E782 Mixed hyperlipidemia: Secondary | ICD-10-CM

## 2020-03-12 DIAGNOSIS — Z794 Long term (current) use of insulin: Secondary | ICD-10-CM

## 2020-03-12 LAB — CBC WITH DIFFERENTIAL/PLATELET
Basophils Absolute: 0 10*3/uL (ref 0.0–0.2)
Basos: 1 %
EOS (ABSOLUTE): 0.1 10*3/uL (ref 0.0–0.4)
Eos: 2 %
Hematocrit: 37.7 % (ref 37.5–51.0)
Hemoglobin: 12.7 g/dL — ABNORMAL LOW (ref 13.0–17.7)
Immature Grans (Abs): 0.2 10*3/uL — ABNORMAL HIGH (ref 0.0–0.1)
Immature Granulocytes: 4 %
Lymphocytes Absolute: 0.8 10*3/uL (ref 0.7–3.1)
Lymphs: 14 %
MCH: 29.3 pg (ref 26.6–33.0)
MCHC: 33.7 g/dL (ref 31.5–35.7)
MCV: 87 fL (ref 79–97)
Monocytes Absolute: 0.4 10*3/uL (ref 0.1–0.9)
Monocytes: 7 %
Neutrophils Absolute: 4 10*3/uL (ref 1.4–7.0)
Neutrophils: 72 %
Platelets: 266 10*3/uL (ref 150–450)
RBC: 4.34 x10E6/uL (ref 4.14–5.80)
RDW: 14.1 % (ref 11.6–15.4)
WBC: 5.5 10*3/uL (ref 3.4–10.8)

## 2020-03-12 LAB — CMP14+EGFR
ALT: 28 IU/L (ref 0–44)
AST: 29 IU/L (ref 0–40)
Albumin/Globulin Ratio: 1.5 (ref 1.2–2.2)
Albumin: 3.2 g/dL — ABNORMAL LOW (ref 3.7–4.7)
Alkaline Phosphatase: 71 IU/L (ref 44–121)
BUN/Creatinine Ratio: 19 (ref 10–24)
BUN: 28 mg/dL — ABNORMAL HIGH (ref 8–27)
Bilirubin Total: 1 mg/dL (ref 0.0–1.2)
CO2: 22 mmol/L (ref 20–29)
Calcium: 8.4 mg/dL — ABNORMAL LOW (ref 8.6–10.2)
Chloride: 92 mmol/L — ABNORMAL LOW (ref 96–106)
Creatinine, Ser: 1.5 mg/dL — ABNORMAL HIGH (ref 0.76–1.27)
GFR calc Af Amer: 53 mL/min/{1.73_m2} — ABNORMAL LOW (ref >59)
GFR calc non Af Amer: 46 mL/min/{1.73_m2} — ABNORMAL LOW (ref >59)
Globulin, Total: 2.2 g/dL (ref 1.5–4.5)
Glucose: 255 mg/dL — ABNORMAL HIGH (ref 65–99)
Potassium: 5.3 mmol/L — ABNORMAL HIGH (ref 3.5–5.2)
Sodium: 127 mmol/L — ABNORMAL LOW (ref 134–144)
Total Protein: 5.4 g/dL — ABNORMAL LOW (ref 6.0–8.5)

## 2020-03-12 NOTE — Patient Instructions (Signed)
Visit Information  PATIENT GOALS:  Goals Addressed            This Visit's Progress   . Improve My Quality of Life       Timeframe:  Short-Term Goal Priority:  High Start Date: 03/12/20                            Expected End Date: 09/09/20                      Follow Up Date 03/19/20    . Work with home health nursing and physical therapy to improve strength, balance, and overall health . Work with Consulting civil engineer to discuss ways to self-manage medical conditions [phone] . Work with LCSW to discuss coping mechanisms and stress management [phone] . Keep all medical appointments . Reach out to PCP with any new or worsening problems [phone] . Eat regular meals and supplement with Glucerna if appetite is poor   Why is this important?    Having a long-term illness can be scary.   It can also be stressful for you and your caregiver.   These steps may help.    Notes:     Marland Kitchen Monitor and Manage My Blood Sugar-Diabetes Type 2       Timeframe:  Long-Range Goal Priority:  Medium Start Date:  2/2/222                           Expected End Date: 09/09/20                      Follow Up Date 03/19/20    . check blood sugar at prescribed times . check blood sugar if I feel it is too high or too low . enter blood sugar readings and medication or insulin into daily log . take the blood sugar log to all doctor visits  . Call PCP [phone] with any readings outside of recommended range . Eat meals regularly. Try and eat something or drink Glucerna supplement even if appetite is poor. Marland Kitchen Reach out to [place] with any questions [phone]   Why is this important?    Checking your blood sugar at home helps to keep it from getting very high or very low.   Writing the results in a diary or log helps the doctor know how to care for you.   Your blood sugar log should have the time, date and the results.   Also, write down the amount of insulin or other medicine that you  take.   Other information, like what you ate, exercise done and how you were feeling, will also be helpful.     Notes:        Consent to CCM Services: Daniel Myers was given information about Chronic Care Management services today including:  1. CCM service includes personalized support from designated clinical staff supervised by his physician, including individualized plan of care and coordination with other care providers 2. 24/7 contact phone numbers for assistance for urgent and routine care needs. 3. Service will only be billed when office clinical staff spend 20 minutes or more in a month to coordinate care. 4. Only one practitioner may furnish and bill the service in a calendar month. 5. The patient may stop CCM services at any time (effective at the end of the month) by phone  call to the office staff. 6. The patient will be responsible for cost sharing (co-pay) of up to 20% of the service fee (after annual deductible is met).  Patient agreed to services and verbal consent obtained.   Patient verbalizes understanding of instructions provided today and agrees to view in [name].   Follow Up Plan:  . Telephone follow up appointment with care management team member scheduled for: 03/19/20 with RN Care Manager . The patient has been provided with contact information for the care management team and has been advised to call with any health related questions or concerns.  . Next PCP appointment scheduled for: 03/20/20 with Dr Livia Snellen . Next cardio appointment scheduled for 04/04/20   [name], BSN, RN-BC Embedded Chronic Care Manager [place] [facility] [place] Management Direct Dial: [phone]   CLINICAL CARE PLAN: Patient Care Plan: RN: General Plan of Care (Adult)    Problem Identified: Quality of Life (General Plan of Care)     Goal: Quality of Life Improved   Start Date: 03/12/2020  This Visit's Progress: Not on track  Priority: High  Note:   Current  Barriers:  . Care Coordination needs related to improving quality of life and independence in a patient with HTN, HLD, CKD, DM, AFib, hx of MI, recent covid infection with pneumonia, and new onset left sided weakness . Unable to perform ADLs independently . Unable to perform IADLs independently,   Nurse Case Manager Clinical Goal(s):  Marland Kitchen Over the next 15 days, patient will verbalize understanding of plan for improving self-care ability and independence.  . Over the next 90 days, patient will work with RN Care Manager to address needs related to left sided weakness and decreased ability to provide care for himself.  Interventions:  . 1:1 collaboration with Claretta Fraise, MD regarding development and update of comprehensive plan of care as evidenced by provider attestation and co-signature . Inter-disciplinary care team collaboration (see longitudinal plan of care) . Chart reviewed including relevant office notes, referral notes, lab results, orders, and imaging reports . Collaborated with [facility] Referral Coordinator regarding order for [place] and PT services and MRI to r/o CVA o Referral for home health has been sent to [facility] and is pending approval o MRI has not been scheduled yet . Collaborated with [facility] clinical staff regarding order for rolling walker o Order has not been sent to DME supply company o Asked that they fax it to [place] per daughter, [name]'s, request . Talked with daughter, [name], by telephone . Discussed family/social support o Two daughters and one son o Lives alone but [name] has been staying with him overnight for the past week since his fall o [name] also provides most of his support and care during the day. She has some help from [place] during the week and on the weekends. [place] isn't able to provide much assistance due to his own medical conditions but does visit often.  o Family is able to provide transportation, buy groceries, provide meals, help with  ADLs . Discussed mobility and ability to perform ADLs o Patient uses a standard walker in the home and has a wheelchair for appointments o Ramp at the front door o Able to feed himself but food has to be prepared for him o Able to dress, bath, and go to the bathroom with assistance . Discussed diet  o Poor appetite and altered sense of taste since having covid. Says that everything tastes bad except some fruit. -  Eating apples, pears, bananas, peanut butter o Discussed blood sugar management in regards to poor appetite and poor oral intake o Recommended Glucerna as a nutritional supplement  o Recommended that they provide meals at regular intervals and encourage patient to eat . Discussed recent fall and fall prevention . Discussed recent Covid infection and continued respiratory complications o Encouraged to prop up at night when sleeping to aid breathing  o He is not using O2. Sats were 92% in the office and 89% with EMS check last week.  o Being treated for pneumonia . Medications reviewed and discussed . Collaborated with LCSW regarding current status. He is scheduled to give them a call tomorrow.  . Provided with RNCM contact number and encouraged to reach out as needed . Encouraged to reach out to PCP with any new or worsening symptoms  Patient Goals/Self-Care Activities Over the next 30 days, patient will: .  Work with home health nursing and physical therapy to improve strength, balance, and overall health . Work with Consulting civil engineer to discuss ways to self-manage medical conditions [phone] . Work with LCSW to discuss coping mechanisms and stress management [phone] . Keep all medical appointments . Reach out to PCP with any new or worsening problems [phone] . Eat regular meals and supplement with Glucerna if appetite is poor  Follow Up Plan:  . Telephone follow up appointment with care management team member scheduled for: 03/19/20 with RN Care Manager . The  patient has been provided with contact information for the care management team and has been advised to call with any health related questions or concerns.  . Next PCP appointment scheduled for: 03/20/20 with Dr Livia Snellen . Next cardio appointment scheduled for 04/04/20       Patient Care Plan: RNCM: Diabetes Type 2 (Adult)    Problem Identified: Glycemic Management (Diabetes, Type 2)   Priority: Medium    Long-Range Goal: Glycemic Management Optimized   Start Date: 03/12/2020  This Visit's Progress: Not on track  Priority: Medium  Note:   Current Barriers:  . Chronic Disease Management support and education needs related to diabetes . Unable to perform ADLs independently . Unable to perform IADLs independently  Nurse Case Manager Clinical Goal(s):  Marland Kitchen Over the next 30 days, patient will verbalize understanding of plan for diabetes management . Over the next 90 days, patient will work with Consulting civil engineer to address needs related to self-management of diabetes  Interventions:  . 1:1 collaboration with Claretta Fraise, MD regarding development and update of comprehensive plan of care as evidenced by provider attestation and co-signature . Inter-disciplinary care team collaboration (see longitudinal plan of care) . Evaluation of current treatment plan related to daibetes and patient's adherence to plan as established by provider. . Chart reviewed including relevant office notes and lab results . Reviewed and discussed medications o Note added to medication list to reflect how patient is taking insulin - Relion N 27 unites AM and PM - Relion R 20 units TID with meals (when meals are eaten) . Discussed home blood sugar monitoring o Testing 4 to 6 times a day o Typically 100-150 fasting o 200-300 during the day o One episode of <70 over the past two weeks. That is the day that he fell. CBG 65. Marland Kitchen Discussed diet o Poor appetite and oral intake and altered sense of taste since having  Covid o He does like fruit (apples, pears, bananas) and peanut butter o Recommended fruits with a lower glycemic index  and peanut butter with no added sugar o Recommended Glucerna supplement in addition to meals/snacks o Recommended to provide meals at regular intervals and encourage patient to eat . Reviewed upcoming appointments . Provided with RN Care Manager contact number and encouraged to reach out as needed  Patient Goals/Self-Care Activities Over the next 30 days, patient will: . check blood sugar at prescribed times . check blood sugar if I feel it is too high or too low . enter blood sugar readings and medication or insulin into daily log . take the blood sugar log to all doctor visits  . Call PCP [phone] with any readings outside of recommended range . Eat meals regularly. Try and eat something or drink Glucerna supplement even if appetite is poor. Marland Kitchen Reach out to [place] with any questions [phone]  Follow Up Plan:  . Telephone follow up appointment with care management team member scheduled for: 03/19/20 with RN Care Manager . The patient has been provided with contact information for the care management team and has been advised to call with any health related questions or concerns.  . Next PCP appointment scheduled for: 03/20/20 with Dr Livia Snellen . Next cardio appointment scheduled for 04/04/20

## 2020-03-12 NOTE — Chronic Care Management (AMB) (Signed)
  Chronic Care Management   Note  03/12/2020 Name: AMOUR CUTRONE MRN: [id number] DOB: May 28, 1945  TYRIAN PEART is a 75 y.o. year old male who is a primary care patient of Stacks, [name], MD. I reached out to [employer] by phone today in response to a referral sent by Mr. Burnard Enis Mcclusky's PCP, Claretta Fraise, MD.  Mr. Nicholson was given information about Chronic Care Management services today including:  1. CCM service includes personalized support from designated clinical staff supervised by his physician, including individualized plan of care and coordination with other care providers 2. 24/7 contact phone numbers for assistance for urgent and routine care needs. 3. Service will only be billed when office clinical staff spend 20 minutes or more in a month to coordinate care. 4. Only one practitioner may furnish and bill the service in a calendar month. 5. The patient may stop CCM services at any time (effective at the end of the month) by phone call to the office staff. 6. The patient will be responsible for cost sharing (co-pay) of up to 20% of the service fee (after annual deductible is met).  Patient daughter [name] DPR on file verbally agreed to assistance and services provided by embedded care coordination/care management team today.  Follow up plan: Telephone appointment with care management team member scheduled JYN:WGNF 03/12/2020 and 07/11/1306 with Licensed Clinical Social Worker.   [place] Management

## 2020-03-12 NOTE — Chronic Care Management (AMB) (Signed)
Chronic Care Management   CCM RN Visit Note  03/12/2020 Name: Daniel Myers MRN: [id number] DOB: 1945/11/28  Subjective: Daniel Myers is a 75 y.o. year old male who is a primary care patient of Stacks, [name], MD. The care management team was consulted for assistance with disease management and care coordination needs.    Engaged with patient's daughter, [name], by telephone for initial visit in response to provider referral for case management and/or care coordination services.   Consent to Services:  The patient was given the following information about Chronic Care Management services today, agreed to services, and gave verbal consent: 1. CCM service includes personalized support from designated clinical staff supervised by the primary care provider, including individualized plan of care and coordination with other care providers 2. 24/7 contact phone numbers for assistance for urgent and routine care needs. 3. Service will only be billed when office clinical staff spend 20 minutes or more in a month to coordinate care. 4. Only one practitioner may furnish and bill the service in a calendar month. 5.The patient may stop CCM services at any time (effective at the end of the month) by phone call to the office staff. 6. The patient will be responsible for cost sharing (co-pay) of up to 20% of the service fee (after annual deductible is met). Patient agreed to services and consent obtained.  Patient agreed to services and verbal consent obtained.   Assessment: Review of patient past medical history, allergies, medications, health status, including review of consultants reports, laboratory and other test data, was performed as part of comprehensive evaluation and provision of chronic care management services.   SDOH (Social Determinants of Health) assessments and interventions performed:    CCM Care Plan  Allergies  Allergen Reactions  . Other Rash    Use paper tape.   . Tape Rash    Use paper  tape.   . Elemental Sulfur Rash    Outpatient Encounter Medications as of 03/12/2020  Medication Sig Note  . albuterol (VENTOLIN HFA) 108 (90 Base) MCG/ACT inhaler Inhale 2 puffs into the lungs every 6 (six) hours as needed for wheezing or shortness of breath.   Marland Kitchen apixaban (ELIQUIS) 5 MG TABS tablet Take 1 tablet (5 mg total) by mouth 2 (two) times daily.   Marland Kitchen ascorbic acid (VITAMIN C) 500 MG tablet Take 1 tablet (500 mg total) by mouth daily.   . blood glucose meter kit and supplies Dispense based on patient and insurance preference. Use up to four times daily as directed. (FOR ICD-10 E10.9, E11.9).   . fexofenadine-pseudoephedrine (ALLEGRA-D 24) 180-240 MG 24 hr tablet Take 1 tablet by mouth every evening. For allergy and congestion   . fluorouracil (EFUDEX) 5 % cream APPLY A SMALL AMOUNT OF CREAM TOPICALLY TWICE DAILY TO RIGHT EAR FOR 3 WEEKS   . fluticasone (FLONASE) 50 MCG/ACT nasal spray USE ONE SPRAY(S) IN EACH NOSTRIL ONCE DAILY   . furosemide (LASIX) 40 MG tablet Take 40 mg by mouth daily.   . insulin NPH Human (NOVOLIN N) 100 UNIT/ML injection 30 units sq AC breakfast, 40 units sq ac supper 03/12/2020: 03/12/2020 Per daughter, [name], taking Relion N 27 units in the morning and 27 units qhs  . Insulin Regular Human 100 UNIT/ML SOPN Inject 12 units before lunch and 7 units before supper. 03/12/2020: 03/12/2020 Per daughter, [name], taking Relion R 20 units TID with meals (when meals are eaten)  . levofloxacin (LEVAQUIN) 500 MG tablet Take 1 tablet (500  mg total) by mouth daily. For 10 days   . metoprolol tartrate (LOPRESSOR) 25 MG tablet Take 1 tablet (25 mg total) by mouth 2 (two) times daily.   . mirtazapine (REMERON) 15 MG tablet Take 1 tablet (15 mg total) by mouth at bedtime. For sleep   . loratadine (CLARITIN) 10 MG tablet Take by mouth. (Patient not taking: Reported on 03/12/2020)   . mycophenolate (CELLCEPT) 250 MG capsule TAKE 2 CAPSULES BY MOUTH TWO TIMES DAILY.   Marland Kitchen predniSONE (DELTASONE) 10  MG tablet Take 5 daily for 3 days followed by 4,3,2 and 1 for 3 days each.   Daryll Brod INSULIN SYRINGE 31G X 15/64" 0.5 ML MISC USE AS DIRECTED FOR INSULIN   . sodium bicarbonate 650 MG tablet Take 650 mg by mouth 2 (two) times daily.   . tacrolimus (PROGRAF) 0.5 MG capsule TAKE 2 CAPSULES BY MOUTH TWO TIMES DAILY.   Marland Kitchen zinc sulfate 220 (50 Zn) MG capsule Take 1 capsule (220 mg total) by mouth daily.    No facility-administered encounter medications on file as of 03/12/2020.    Patient Active Problem List   Diagnosis Date Noted  . Persistent atrial fibrillation ([place]) 03/11/2020  . Atrial fibrillation with RVR ([place]) 03/11/2020  . Unspecified atrial fibrillation ([place]) 02/17/2020  . NSTEMI (non-ST elevated myocardial infarction) ([place]) 02/13/2020  . Systolic and diastolic CHF, acute ([place]) 02/13/2020  . Thrombocytopenia ([place]) 02/13/2020  . AKI (acute kidney injury) ([place]) 02/13/2020  . Pneumonia due to COVID-19 virus 02/12/2020  . Hyperglycemia due to diabetes mellitus ([place]) 02/12/2020  . Acute respiratory failure with hypoxia ([place]) 02/12/2020  . CKD (chronic kidney disease), stage III ([place]) 02/12/2020  . Essential hypertension 11/22/2018  . Insulin dependent type 2 diabetes mellitus ([place]) 11/22/2018  . 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 Mixed hyperlipidemia 11/22/2018  . Vision decreased 11/22/2018  . Hyponatremia 11/22/2018  . Seasonal allergic rhinitis due to pollen 11/22/2018    Conditions to be addressed/monitored:Atrial Fibrillation, HTN, HLD, DMII, CKD Stage 3 and MI and recent Covid infection with pneumonia.  Care Plan : RN: General Plan of Care (Adult)  Updates made by Ilean China, RN since 03/12/2020 12:00 AM    Problem: Quality of Life (General Plan of Care)     Goal: Quality of Life Improved   Start Date: 03/12/2020  This Visit's Progress: Not on track  Priority: High  Note:   Current Barriers:  . Care Coordination needs related to improving quality of life and  independence in a patient with HTN, HLD, CKD, DM, AFib, hx of MI, recent covid infection with pneumonia, and new onset left sided weakness . Unable to perform ADLs independently . Unable to perform IADLs independently,   Nurse Case Manager Clinical Goal(s):  Marland Kitchen Over the next 15 days, patient will verbalize understanding of plan for improving self-care ability and independence.  . Over the next 90 days, patient will work with RN Care Manager to address needs related to left sided weakness and decreased ability to provide care for himself.  Interventions:  . 1:1 collaboration with Claretta Fraise, MD regarding development and update of comprehensive plan of care as evidenced by provider attestation and co-signature . Inter-disciplinary care team collaboration (see longitudinal plan of care) . Chart reviewed including relevant office notes, referral notes, lab results, orders, and imaging reports . Collaborated with [facility] Referral Coordinator regarding order for [place] and PT services and MRI to r/o CVA o Referral for home health has been  sent to [facility] and is pending approval o MRI has not been scheduled yet . Collaborated with [facility] clinical staff regarding order for rolling walker o Order has not been sent to DME supply company o Asked that they fax it to [place] per daughter, [name]'s, request . Talked with daughter, [name], by telephone . Discussed family/social support o Two daughters and one son o Lives alone but [name] has been staying with him overnight for the past week since his fall o [name] also provides most of his support and care during the day. She has some help from [place] during the week and on the weekends. [place] isn't able to provide much assistance due to his own medical conditions but does visit often.  o Family is able to provide transportation, buy groceries, provide meals, help with ADLs . Discussed mobility and ability to perform ADLs o Patient uses a  standard walker in the home and has a wheelchair for appointments o Ramp at the front door o Able to feed himself but food has to be prepared for him o Able to dress, bath, and go to the bathroom with assistance . Discussed diet  o Poor appetite and altered sense of taste since having covid. Says that everything tastes bad except some fruit. - Eating apples, pears, bananas, peanut butter o Discussed blood sugar management in regards to poor appetite and poor oral intake o Recommended Glucerna as a nutritional supplement  o Recommended that they provide meals at regular intervals and encourage patient to eat . Discussed recent fall and fall prevention . Discussed recent Covid infection and continued respiratory complications o Encouraged to prop up at night when sleeping to aid breathing  o He is not using O2. Sats were 92% in the office and 89% with EMS check last week.  o Being treated for pneumonia . Medications reviewed and discussed . Collaborated with LCSW regarding current status. He is scheduled to give them a call tomorrow.  . Provided with RNCM contact number and encouraged to reach out as needed . Encouraged to reach out to PCP with any new or worsening symptoms  Patient Goals/Self-Care Activities Over the next 30 days, patient will: .  Work with home health nursing and physical therapy to improve strength, balance, and overall health . Work with Consulting civil engineer to discuss ways to self-manage medical conditions [phone] . Work with LCSW to discuss coping mechanisms and stress management [phone] . Keep all medical appointments . Reach out to PCP with any new or worsening problems [phone] . Eat regular meals and supplement with Glucerna if appetite is poor    Care Plan : RNCM: Diabetes Type 2 (Adult)  Updates made by Ilean China, RN since 03/12/2020 12:00 AM    Problem: Glycemic Management (Diabetes, Type 2)   Priority: Medium    Long-Range Goal: Glycemic  Management Optimized   Start Date: 03/12/2020  This Visit's Progress: Not on track  Priority: Medium  Note:   Current Barriers:  . Chronic Disease Management support and education needs related to diabetes . Unable to perform ADLs independently . Unable to perform IADLs independently  Nurse Case Manager Clinical Goal(s):  Marland Kitchen Over the next 30 days, patient will verbalize understanding of plan for diabetes management . Over the next 90 days, patient will work with Consulting civil engineer to address needs related to self-management of diabetes  Interventions:  . 1:1 collaboration with Claretta Fraise, MD regarding development and update of comprehensive plan of care as  evidenced by provider attestation and co-signature . Inter-disciplinary care team collaboration (see longitudinal plan of care) . Evaluation of current treatment plan related to daibetes and patient's adherence to plan as established by provider. . Chart reviewed including relevant office notes and lab results . Reviewed and discussed medications o Note added to medication list to reflect how patient is taking insulin - Relion N 27 unites AM and PM - Relion R 20 units TID with meals (when meals are eaten) . Discussed home blood sugar monitoring o Testing 4 to 6 times a day o Typically 100-150 fasting o 200-300 during the day o One episode of <70 over the past two weeks. That is the day that he fell. CBG 65. Marland Kitchen Discussed diet o Poor appetite and oral intake and altered sense of taste since having Covid o He does like fruit (apples, pears, bananas) and peanut butter o Recommended fruits with a lower glycemic index and peanut butter with no added sugar o Recommended Glucerna supplement in addition to meals/snacks o Recommended to provide meals at regular intervals and encourage patient to eat . Reviewed upcoming appointments . Provided with RN Care Manager contact number and encouraged to reach out as needed  Patient Goals/Self-Care  Activities Over the next 30 days, patient will: . check blood sugar at prescribed times . check blood sugar if I feel it is too high or too low . enter blood sugar readings and medication or insulin into daily log . take the blood sugar log to all doctor visits  . Call PCP [phone] with any readings outside of recommended range . Eat meals regularly. Try and eat something or drink Glucerna supplement even if appetite is poor. Marland Kitchen Reach out to [place] with any questions [phone]      Follow Up Plan:  . Telephone follow up appointment with care management team member scheduled for: 03/19/20 with RN Care Manager . The patient has been provided with contact information for the care management team and has been advised to call with any health related questions or concerns.  . Next PCP appointment scheduled for: 03/20/20 with Dr Livia Snellen . Next cardio appointment scheduled for 04/04/20   [name], BSN, RN-BC [place] [facility] [place] Management Direct Dial: [phone]

## 2020-03-13 ENCOUNTER — Ambulatory Visit: Payer: Medicare Other | Admitting: Licensed Clinical Social Worker

## 2020-03-13 DIAGNOSIS — I4891 Unspecified atrial fibrillation: Secondary | ICD-10-CM

## 2020-03-13 DIAGNOSIS — H547 Unspecified visual loss: Secondary | ICD-10-CM

## 2020-03-13 DIAGNOSIS — I10 Essential (primary) hypertension: Secondary | ICD-10-CM

## 2020-03-13 DIAGNOSIS — E782 Mixed hyperlipidemia: Secondary | ICD-10-CM

## 2020-03-13 DIAGNOSIS — N183 Chronic kidney disease, stage 3 unspecified: Secondary | ICD-10-CM

## 2020-03-13 DIAGNOSIS — E119 Type 2 diabetes mellitus without complications: Secondary | ICD-10-CM

## 2020-03-13 DIAGNOSIS — I252 Old myocardial infarction: Secondary | ICD-10-CM

## 2020-03-13 NOTE — Chronic Care Management (AMB) (Signed)
Chronic Care Management    Clinical Social Work General Note  03/13/2020 Name: Daniel Myers MRN: [id number] DOB: 1945-11-23  Daniel Myers is a 75 y.o. year old male who is a primary care patient of Daniel Myers, Daniel Gash, MD. The CCM was consulted to assist the patient with Mental Health support and resources.   Mr. Daniel Myers was given information about Chronic Care Management services today including:  1. CCM service includes personalized support from designated clinical staff supervised by his physician, including individualized plan of care and coordination with other care providers 2. 24/7 contact phone numbers for assistance for urgent and routine care needs. 3. Service will only be billed when office clinical staff spend 20 minutes or more in a month to coordinate care. 4. Only one practitioner may furnish and bill the service in a calendar month. 5. The patient may stop CCM services at any time (effective at the end of the month) by phone call to the office staff. 6. The patient will be responsible for cost sharing (co-pay) of up to 20% of the service fee (after annual deductible is met).  Patient agreed to services and verbal consent obtained.   Review of patient status, including review of consultants reports, relevant laboratory and other test results, and collaboration with appropriate care team members and the patient's provider was performed as part of comprehensive patient evaluation and provision of chronic care management services.    SDOH (Social Determinants of Health) assessments and interventions performed:  Yes; risk for depression; risk for stress and anxiety; risk for physical inactivity    Outpatient Encounter Medications as of 03/13/2020  Medication Sig Note  . albuterol (VENTOLIN HFA) 108 (90 Base) MCG/ACT inhaler Inhale 2 puffs into the lungs every 6 (six) hours as needed for wheezing or shortness of breath.   Marland Kitchen apixaban (ELIQUIS) 5 MG TABS tablet Take 1 tablet (5 mg total) by  mouth 2 (two) times daily.   Marland Kitchen ascorbic acid (VITAMIN C) 500 MG tablet Take 1 tablet (500 mg total) by mouth daily.   . blood glucose meter kit and supplies Dispense based on patient and insurance preference. Use up to four times daily as directed. (FOR ICD-10 E10.9, E11.9).   . fexofenadine-pseudoephedrine (ALLEGRA-D 24) 180-240 MG 24 hr tablet Take 1 tablet by mouth every evening. For allergy and congestion   . fluorouracil (EFUDEX) 5 % cream APPLY A SMALL AMOUNT OF CREAM TOPICALLY TWICE DAILY TO RIGHT EAR FOR 3 WEEKS   . fluticasone (FLONASE) 50 MCG/ACT nasal spray USE ONE SPRAY(S) IN EACH NOSTRIL ONCE DAILY   . furosemide (LASIX) 40 MG tablet Take 40 mg by mouth daily.   . insulin NPH Human (NOVOLIN N) 100 UNIT/ML injection 30 units sq AC breakfast, 40 units sq ac supper 03/12/2020: 03/12/2020 Per daughter, Daniel Myers, taking Relion N 27 units in the morning and 27 units qhs  . Insulin Regular Human 100 UNIT/ML SOPN Inject 12 units before lunch and 7 units before supper. 03/12/2020: 03/12/2020 Per daughter, Daniel Myers, taking Relion R 20 units TID with meals (when meals are eaten)  . levofloxacin (LEVAQUIN) 500 MG tablet Take 1 tablet (500 mg total) by mouth daily. For 10 days   . loratadine (CLARITIN) 10 MG tablet Take by mouth. (Patient not taking: Reported on 03/12/2020)   . metoprolol tartrate (LOPRESSOR) 25 MG tablet Take 1 tablet (25 mg total) by mouth 2 (two) times daily.   . mirtazapine (REMERON) 15 MG tablet Take 1 tablet (15 mg total) by  mouth at bedtime. For sleep   . mycophenolate (CELLCEPT) 250 MG capsule TAKE 2 CAPSULES BY MOUTH TWO TIMES DAILY.   Marland Kitchen predniSONE (DELTASONE) 10 MG tablet Take 5 daily for 3 days followed by 4,3,2 and 1 for 3 days each.   Daryll Brod INSULIN SYRINGE 31G X 15/64" 0.5 ML MISC USE AS DIRECTED FOR INSULIN   . sodium bicarbonate 650 MG tablet Take 650 mg by mouth 2 (two) times daily.   . tacrolimus (PROGRAF) 0.5 MG capsule TAKE 2 CAPSULES BY MOUTH TWO TIMES DAILY.   Marland Kitchen zinc sulfate  220 (50 Zn) MG capsule Take 1 capsule (220 mg total) by mouth daily.    No facility-administered encounter medications on file as of 03/13/2020.    Goals Addressed              This Visit's Progress   .  Manage My Emotions (pt-stated)        Timeframe:  Short-Term Goal Visit progress: On Track Priority:  Medium Start Date 03/13/2020                             Expected End Date:    06/10/2020          Current Barriers:   Anxiety and stress issues  Functional challenges  Clinical Goal(s):  Over next 30 days LCSW will communicate with client/daughters to discuss anxiety and stress management for client  Interventions: . Collaboration with Claretta Fraise, MD regarding development and update of comprehensive plan of care as evidenced by provider attestation and co-signature . Inter-disciplinary care team collaboration   Talked with Daniel Myers, daughter of client, about client functional challenge  Talked with Daniel Myers about sleeping issues of client  Talked with Daniel Myers about appetite of client    Talked with Daniel Myers about CCM support  Encouraged [name] to talk with RNCM as needed regarding nursing support for client   Patient Goals/Self Care Activities:  Over the next 30 days, patient will:   Attend scheduled medical appointments, take medications as prescribed, and communicate with RNCM and LCSW as needed for support           Follow Up Plan: LCSW to call Daniel Myers, daughter of client, on 04/10/2020      [name].Elza Varricchio MSW, LCSW Licensed Clinical Social Worker [facility] Care Management [phone]

## 2020-03-13 NOTE — Patient Instructions (Addendum)
Licensed Clinical Social Worker Visit Information  Goals we discussed today:  Goals Addressed              This Visit's Progress   .  Manage My Emotions (pt-stated)        Timeframe:  Short-Term Goal Progress: On track Priority:  Medium Start Date 03/13/2020                             Expected End Date:    06/10/2020                   Follow Up Date: LCSW will call [name] on 04/10/2020    {Manage My Emotions (Patient)     Why is this important?    When you are stressed, down or upset, your body reacts too.   For example, your blood pressure may get higher; you may have a headache or stomachache.   When your emotions get the best of you, your body's ability to fight off cold and flu gets weak.   These steps will help you manage your emotions.           Materials Provided: No  Follow Up Plan: LCSW will call Daniel Myers, daughter of client, on 04/10/2020  The patient Daniel Myers [name], daughter of patient,verbalized understanding of instructions provided today and declined a print copy of patient instruction materials.   [name].Nickolas Chalfin MSW, LCSW Licensed Clinical Social Worker [facility] Care Management [phone]

## 2020-03-14 ENCOUNTER — Encounter (HOSPITAL_COMMUNITY): Payer: Medicare Other

## 2020-03-19 ENCOUNTER — Telehealth: Payer: Medicare Other | Admitting: *Deleted

## 2020-03-19 ENCOUNTER — Telehealth: Payer: Self-pay | Admitting: *Deleted

## 2020-03-19 NOTE — Telephone Encounter (Signed)
  Chronic Care Management   Outreach Note  03/19/2020 Name: Daniel Myers MRN: [id number] DOB: 06-Nov-1945  Referred by: Claretta Fraise, MD Reason for referral : Chronic Care Management (RN Follow-up)   A first unsuccessful follow-up Telephone Visit was attempted today. The patient was referred to the case management team for assistance with care management and care coordination.   Clinical Goals: . Over the next 30 days, patient will be contacted by a Care Guide to reschedule their CCM Visit  Interventions and Plan . Chart reviewed in preparation for telephone visit . Collaboration with other care team members as needed . A HIPAA compliant phone message was left for the patient providing contact information and requesting a return call.  . Request sent to care guides to reach out and reschedule patient's telephone visit   [name], BSN, RN-BC [place] / [place] Management Direct Dial: [phone]

## 2020-03-20 ENCOUNTER — Encounter: Payer: Self-pay | Admitting: Family Medicine

## 2020-03-20 ENCOUNTER — Ambulatory Visit (INDEPENDENT_AMBULATORY_CARE_PROVIDER_SITE_OTHER): Payer: Medicare Other | Admitting: Family Medicine

## 2020-03-20 ENCOUNTER — Ambulatory Visit (INDEPENDENT_AMBULATORY_CARE_PROVIDER_SITE_OTHER): Payer: Medicare Other

## 2020-03-20 ENCOUNTER — Other Ambulatory Visit: Payer: Self-pay

## 2020-03-20 VITALS — BP 128/75 | HR 130 | Temp 97.7°F | Resp 22

## 2020-03-20 DIAGNOSIS — J1282 Pneumonia due to coronavirus disease 2019: Secondary | ICD-10-CM

## 2020-03-20 DIAGNOSIS — I5041 Acute combined systolic (congestive) and diastolic (congestive) heart failure: Secondary | ICD-10-CM | POA: Diagnosis not present

## 2020-03-20 DIAGNOSIS — I10 Essential (primary) hypertension: Secondary | ICD-10-CM | POA: Diagnosis not present

## 2020-03-20 DIAGNOSIS — U071 COVID-19: Secondary | ICD-10-CM

## 2020-03-20 DIAGNOSIS — I4819 Other persistent atrial fibrillation: Secondary | ICD-10-CM

## 2020-03-20 MED ORDER — ALBUTEROL SULFATE HFA 108 (90 BASE) MCG/ACT IN AERS: 2.0000 | INHALATION_SPRAY | Freq: Four times a day (QID) | RESPIRATORY_TRACT | 3 refills | Status: DC | PRN

## 2020-03-20 MED ORDER — POTASSIUM CHLORIDE CRYS ER 20 MEQ PO TBCR: 20.0000 meq | EXTENDED_RELEASE_TABLET | Freq: Every day | ORAL | 5 refills | Status: DC

## 2020-03-20 MED ORDER — METOPROLOL TARTRATE 50 MG PO TABS: 50.0000 mg | ORAL_TABLET | Freq: Two times a day (BID) | ORAL | 2 refills | Status: DC

## 2020-03-20 MED ORDER — FUROSEMIDE 40 MG PO TABS: 40.0000 mg | ORAL_TABLET | Freq: Two times a day (BID) | ORAL | 1 refills | Status: DC

## 2020-03-20 NOTE — Progress Notes (Signed)
Subjective:  Patient ID: Daniel Myers, male    DOB: 31-Aug-1945  Age: 75 y.o. MRN: [id number]  CC: Pneumonia (1 week recheck/)   HPI Daniel Myers presents for recheck of the Covid pneumonia for which she was seen about 10 days ago.  He states that he has had less shortness of breath.  He still has some cough.  He was diagnosed with atrial fibrillation as well.  He is now on anticoagulation.  He denies any excessive bruising or bleeding.  He is also an insulin-dependent diabetic.  He has not had any more low blood sugar readings since he was last here.  He did have 1 more fall.  He has a MRI brain scan scheduled for 1 week from now.  This is due to some left-sided weakness and concern for stroke.  He did start getting some physical therapy this past week for the left-sided weakness.  His daughter [name] is here with him again and she gives history along with Daniel Myers who is much more interactive today than last visit.  He states that productivity from his cough has diminished.  There is still swelling in the legs but it is less than before.  He is still having left-sided joint pain unchanged from before.  The contusions at the left knee are about the same all the others have started to resolve.  He is not sleeping any better.  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), 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), Squamous cell carcinoma of skin  (11/08/2011), Squamous cell carcinoma of skin (11/08/2011), Squamous cell carcinoma of skin (11/08/2011), 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), 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), 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), 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), 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), 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), 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), 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), Squamous cell carcinoma of skin (09/30/2015), Squamous cell carcinoma of skin (02/22/2017), Squamous cell carcinoma of skin (02/22/2017), Squamous cell carcinoma of skin (04/11/2018), Squamous cell carcinoma of skin (04/11/2018), Squamous cell carcinoma of skin (04/11/2018), Squamous cell carcinoma of skin (04/11/2018), Squamous cell carcinoma of skin (04/11/2018), Squamous cell carcinoma of skin (09/28/2018), Squamous cell carcinoma of skin (09/28/2018), Squamous cell carcinoma of skin (09/28/2018), Squamous cell carcinoma of skin (03/21/2019), Squamous cell carcinoma of skin (03/21/2019), Squamous cell carcinoma of skin (03/21/2019), Type 2 diabetes mellitus ([place]), and Unspecified atrial fibrillation ([place]) (02/17/2020).   He has a past surgical history that includes Kidney transplant (Right).   His family history includes Clotting disorder in his mother; Diabetes in his sister; Hypertension in his sister.He reports that he has been smoking pipe. He has never used smokeless tobacco. He reports that he does not drink alcohol and does not use drugs.    ROS Review of Systems  Constitutional: Positive for fatigue. Negative for activity change, appetite change and fever.  HENT: Positive for congestion and postnasal drip.   Respiratory: Positive for cough and shortness of breath.   Cardiovascular:  Negative for chest pain.  Musculoskeletal: Positive for arthralgias.  Skin: Negative for rash.  Neurological: Positive for weakness.     Objective:  BP 128/75   Pulse (!) 130   Temp 97.7 F (36.5 C) (Temporal)   Resp (!) 22   SpO2 96%   BP Readings from Last 3 Encounters:  03/20/20 128/75  03/11/20 136/87  03/06/20 130/64    Wt Readings from Last 3 Encounters:  03/06/20 210 lb (95.3 kg)  02/18/20 211 lb 10.3 oz (96 kg)  01/02/20 210 lb 6 oz (95.4 kg)     Physical Exam Constitutional:      General: He is not in acute distress.    Appearance: He is well-developed.  HENT:     Head: Normocephalic and atraumatic.     Right Ear: External ear normal.     Left Ear: External ear normal.     Nose: Nose normal.  Eyes:     Conjunctiva/sclera: Conjunctivae normal.     Pupils: Pupils are equal, round, and reactive to light.  Cardiovascular:     Rate and Rhythm: Normal rate and regular rhythm.     Heart sounds: Normal heart sounds. No murmur heard.   Pulmonary:     Effort: Pulmonary effort is normal. No respiratory distress.     Breath sounds: Normal breath sounds. No wheezing or rales.  Abdominal:     Palpations: Abdomen is soft.     Tenderness: There is no abdominal tenderness.  Musculoskeletal:        General: Swelling (2-3+ both lower extremities to the mid leg) present. No deformity. Normal range of motion.     Cervical back: Normal range of motion and neck supple.     Comments: Patient is in his wheelchair now.  Skin:    General: Skin is warm and dry.     Coloration: Skin is not jaundiced.     Findings: Lesion (Multiple contusions primarily at the left knee.  There are some others from before that are in various stages of healing but improved from 10 days ago.) present.  Neurological:     Mental Status: He is alert.     Motor: Weakness (2-3/5 strength on the left lower and upper extremities.  5/5 on the right lower extremity and upper extremities) present.     Deep Tendon Reflexes: Reflexes are normal and symmetric.  Psychiatric:        Behavior: Behavior normal.        Thought Content: Thought  content normal.        Judgment: Judgment normal.       Assessment & Plan:   Daniel Myers was seen today for pneumonia.  Diagnoses and all orders for this visit:  Systolic and diastolic CHF, acute ([place]) -     DG Chest 2 View; Future -     BMP8+EGFR  Pneumonia due to COVID-19 virus -     DG Chest 2 View; Future -     BMP8+EGFR  Essential hypertension -     metoprolol tartrate (LOPRESSOR) 50 MG tablet; Take 1 tablet (50 mg total) by mouth 2 (two) times daily. -     BMP8+EGFR  Persistent atrial fibrillation (HCC) -     metoprolol tartrate (LOPRESSOR) 50 MG tablet; Take 1 tablet (50 mg total) by mouth 2 (two) times daily. -     BMP8+EGFR  Other orders -     albuterol (VENTOLIN HFA) 108 (90 Base) MCG/ACT inhaler; Inhale 2 puffs into the lungs  every 6 (six) hours as needed for wheezing or shortness of breath. -     furosemide (LASIX) 40 MG tablet; Take 1 tablet (40 mg total) by mouth 2 (two) times daily. One at breakfast, the other 4-6 hours later. -     potassium chloride SA (KLOR-CON) 20 MEQ tablet; Take 1 tablet (20 mEq total) by mouth daily. For potassium replacement/ supplement       I have changed Daniel Myers's metoprolol tartrate and furosemide. I am also having him start on potassium chloride SA. Additionally, I am having him maintain his tacrolimus, sodium bicarbonate, mycophenolate, ReliOn Insulin Syringe, loratadine, fexofenadine-pseudoephedrine, fluticasone, insulin NPH Human, fluorouracil, blood glucose meter kit and supplies, Insulin Regular Human, apixaban, zinc sulfate, ascorbic acid, mirtazapine, predniSONE, and albuterol.  Allergies as of 03/20/2020      Reactions   Other Rash   Use paper tape.    Tape Rash   Use paper tape.    Elemental Sulfur Rash      Medication List       Accurate as of March 20, 2020 11:59 PM. If you have any questions, ask your nurse or doctor.        albuterol 108 (90 Base) MCG/ACT inhaler Commonly known as: VENTOLIN  HFA Inhale 2 puffs into the lungs every 6 (six) hours as needed for wheezing or shortness of breath.   apixaban 5 MG Tabs tablet Commonly known as: ELIQUIS Take 1 tablet (5 mg total) by mouth 2 (two) times daily.   ascorbic acid 500 MG tablet Commonly known as: VITAMIN C Take 1 tablet (500 mg total) by mouth daily.   blood glucose meter kit and supplies Dispense based on patient and insurance preference. Use up to four times daily as directed. (FOR ICD-10 E10.9, E11.9).   fexofenadine-pseudoephedrine 180-240 MG 24 hr tablet Commonly known as: ALLEGRA-D 24 Take 1 tablet by mouth every evening. For allergy and congestion   fluorouracil 5 % cream Commonly known as: EFUDEX APPLY A SMALL AMOUNT OF CREAM TOPICALLY TWICE DAILY TO RIGHT EAR FOR 3 WEEKS   fluticasone 50 MCG/ACT nasal spray Commonly known as: FLONASE USE ONE SPRAY(S) IN EACH NOSTRIL ONCE DAILY   furosemide 40 MG tablet Commonly known as: LASIX Take 1 tablet (40 mg total) by mouth 2 (two) times daily. One at breakfast, the other 4-6 hours later. What changed:   when to take this  additional instructions Changed by: Claretta Fraise, MD   insulin NPH Human 100 UNIT/ML injection Commonly known as: NOVOLIN N 30 units sq AC breakfast, 40 units sq ac supper   Insulin Regular Human 100 UNIT/ML Sopn Inject 12 units before lunch and 7 units before supper.   levofloxacin 500 MG tablet Commonly known as: LEVAQUIN Take 1 tablet (500 mg total) by mouth daily. For 10 days   loratadine 10 MG tablet Commonly known as: CLARITIN Take by mouth.   metoprolol tartrate 50 MG tablet Commonly known as: LOPRESSOR Take 1 tablet (50 mg total) by mouth 2 (two) times daily. What changed:   medication strength  how much to take Changed by: Claretta Fraise, MD   mirtazapine 15 MG tablet Commonly known as: Remeron Take 1 tablet (15 mg total) by mouth at bedtime. For sleep   mycophenolate 250 MG capsule Commonly known as:  CELLCEPT TAKE 2 CAPSULES BY MOUTH TWO TIMES DAILY.   potassium chloride SA 20 MEQ tablet Commonly known as: KLOR-CON Take 1 tablet (20 mEq total) by mouth daily. For potassium  replacement/ supplement Started by: Claretta Fraise, MD   predniSONE 10 MG tablet Commonly known as: DELTASONE Take 5 daily for 3 days followed by 4,3,2 and 1 for 3 days each.   ReliOn Insulin Syringe 31G X 15/64" 0.5 ML Misc Generic drug: Insulin Syringe-Needle U-100 USE AS DIRECTED FOR INSULIN   sodium bicarbonate 650 MG tablet Take 650 mg by mouth 2 (two) times daily.   tacrolimus 0.5 MG capsule Commonly known as: PROGRAF TAKE 2 CAPSULES BY MOUTH TWO TIMES DAILY.   zinc sulfate 220 (50 Zn) MG capsule Take 1 capsule (220 mg total) by mouth daily.        Follow-up: Return in about 9 days (around 03/29/2020) for CHF.  Claretta Fraise, M.D.

## 2020-03-21 ENCOUNTER — Other Ambulatory Visit: Payer: Self-pay | Admitting: Family Medicine

## 2020-03-21 DIAGNOSIS — J1282 Pneumonia due to coronavirus disease 2019: Secondary | ICD-10-CM

## 2020-03-21 DIAGNOSIS — U071 COVID-19: Secondary | ICD-10-CM

## 2020-03-21 LAB — BMP8+EGFR
BUN/Creatinine Ratio: 29 — ABNORMAL HIGH (ref 10–24)
BUN: 45 mg/dL — ABNORMAL HIGH (ref 8–27)
CO2: 21 mmol/L (ref 20–29)
Calcium: 8.5 mg/dL — ABNORMAL LOW (ref 8.6–10.2)
Chloride: 94 mmol/L — ABNORMAL LOW (ref 96–106)
Creatinine, Ser: 1.53 mg/dL — ABNORMAL HIGH (ref 0.76–1.27)
GFR calc Af Amer: 51 mL/min/{1.73_m2} — ABNORMAL LOW (ref >59)
GFR calc non Af Amer: 44 mL/min/{1.73_m2} — ABNORMAL LOW (ref >59)
Glucose: 288 mg/dL — ABNORMAL HIGH (ref 65–99)
Potassium: 5.1 mmol/L (ref 3.5–5.2)
Sodium: 134 mmol/L (ref 134–144)

## 2020-03-21 MED ORDER — LEVOFLOXACIN 500 MG PO TABS: 500.0000 mg | ORAL_TABLET | Freq: Every day | ORAL | 0 refills | Status: DC

## 2020-03-21 NOTE — Telephone Encounter (Signed)
Daniel Myers, have his son call us back around Wednesday or Thursday next week to see how his father is doing. If no improvement you can go ahead and cancel the stress test and we will reschedule. Thank you

## 2020-03-21 NOTE — Telephone Encounter (Signed)
Patient has been rescheduled.

## 2020-03-24 ENCOUNTER — Encounter: Payer: Self-pay | Admitting: Family Medicine

## 2020-03-24 NOTE — Telephone Encounter (Signed)
Hayley on second thought after reading his multiple different issues, Go ahead and cancel the stress test for now. It looks like he had Covid pneumonia and MI during his hospital stay and he has other multiple medical issues. Have his Son call and reschedule stress test after his father is feeling better. Thanks

## 2020-03-25 ENCOUNTER — Telehealth: Payer: Self-pay | Admitting: *Deleted

## 2020-03-25 NOTE — Telephone Encounter (Signed)
Yes, I sent in duoderm as a scrip, bvut it is available otc

## 2020-03-25 NOTE — Telephone Encounter (Signed)
TC from [place] w/ [facility] She has been doing triple abx, non-stick dsg & kerlix to areas on legs Daughter said that Duoderm was mentioned at his visit last week Please advise

## 2020-03-26 ENCOUNTER — Telehealth: Payer: Self-pay

## 2020-03-26 ENCOUNTER — Ambulatory Visit: Payer: Medicare Other | Admitting: *Deleted

## 2020-03-26 DIAGNOSIS — I4891 Unspecified atrial fibrillation: Secondary | ICD-10-CM

## 2020-03-26 DIAGNOSIS — N183 Chronic kidney disease, stage 3 unspecified: Secondary | ICD-10-CM

## 2020-03-26 DIAGNOSIS — I10 Essential (primary) hypertension: Secondary | ICD-10-CM

## 2020-03-26 DIAGNOSIS — E119 Type 2 diabetes mellitus without complications: Secondary | ICD-10-CM

## 2020-03-26 DIAGNOSIS — U071 COVID-19: Secondary | ICD-10-CM

## 2020-03-26 DIAGNOSIS — Z794 Long term (current) use of insulin: Secondary | ICD-10-CM

## 2020-03-26 DIAGNOSIS — I252 Old myocardial infarction: Secondary | ICD-10-CM

## 2020-03-26 DIAGNOSIS — E782 Mixed hyperlipidemia: Secondary | ICD-10-CM

## 2020-03-26 DIAGNOSIS — N1831 Chronic kidney disease, stage 3a: Secondary | ICD-10-CM | POA: Diagnosis not present

## 2020-03-26 DIAGNOSIS — I4819 Other persistent atrial fibrillation: Secondary | ICD-10-CM | POA: Diagnosis not present

## 2020-03-26 DIAGNOSIS — I214 Non-ST elevation (NSTEMI) myocardial infarction: Secondary | ICD-10-CM

## 2020-03-26 DIAGNOSIS — J1282 Pneumonia due to coronavirus disease 2019: Secondary | ICD-10-CM

## 2020-03-26 DIAGNOSIS — R531 Weakness: Secondary | ICD-10-CM

## 2020-03-26 NOTE — Telephone Encounter (Signed)
Patient aware and verbalized understanding. °

## 2020-03-26 NOTE — Patient Instructions (Addendum)
Visit Information  PATIENT GOALS: Goals Addressed            This Visit's Progress   . Improve My Quality of Life   On track    Timeframe:  Short-Term Goal Priority:  High Start Date: 03/12/20                            Expected End Date: 09/09/20                      Follow Up Date 04/11/20   . Work with home health nursing, physical therapy, and occupational therapy to improve strength, balance, and overall health . Work with Consulting civil engineer to discuss ways to self-manage medical conditions [phone] . Work with LCSW to discuss coping mechanisms and stress management [phone] . Keep appointment for MRI scheduled for 03/27/20 at [facility] and all medical appointments . Reach out to PCP with any new or worsening problems [phone] . Eat meals at regular intervals in order to maintain blood sugar levels and strength   Why is this important?    Having a long-term illness can be scary.   It can also be stressful for you and your caregiver.   These steps may help.    Notes:     Marland Kitchen Monitor and Manage My Blood Sugar-Diabetes Type 2   Not on track    Timeframe:  Long-Range Goal Priority:  Medium Start Date:  2/2/222                           Expected End Date: 09/09/20                      Follow Up Date 03/19/20    . check blood sugar at prescribed times . check blood sugar if I feel it is too high or too low . enter blood sugar readings and medication or insulin into daily log . take the blood sugar log to all doctor visits  . Call PCP [phone] with any readings outside of recommended range . Eat meals at regular intervals . Follow a diabetic diet for blood sugar control . Reach out to [place] with any questions [phone]   Why is this important?    Checking your blood sugar at home helps to keep it from getting very high or very low.   Writing the results in a diary or log helps the doctor know how to care for you.   Your blood sugar log should have  the time, date and the results.   Also, write down the amount of insulin or other medicine that you take.   Other information, like what you ate, exercise done and how you were feeling, will also be helpful.     Notes:        Patient verbalizes understanding of instructions provided today and agrees to view in [place].    Follow Up Plan:  . Telephone follow up appointment with care management team member scheduled for: 04/11/20 with RN Care Manager, 04/15/20 with LCSW . The patient has been provided with contact information for the care management team and has been advised to call with any health related questions or concerns.  . Next PCP appointment scheduled for: 04/03/20 with Dr Livia Snellen . Next cardio appointment scheduled for 04/04/20 . MRI scheduled at [facility] for 03/27/20  Cyril Mourning  Brayleigh Rybacki, BSN, RN-BC Embedded Chronic Care Manager [facility] [place] [facility] Direct Dial: [phone]

## 2020-03-26 NOTE — Telephone Encounter (Signed)
I sent in mirtazapine for him on Feb 1.  Did he try it?

## 2020-03-26 NOTE — Chronic Care Management (AMB) (Signed)
Chronic Care Management   CCM RN Visit Note  03/26/2020 Name: Daniel Myers MRN: [id number] DOB: 01/18/1946  Subjective: Daniel Myers is a 75 y.o. year old male who is a primary care patient of Stacks, [name], MD. The care management team was consulted for assistance with disease management and care coordination needs.    Engaged with patient's daughter, [name], by telephone for follow up visit in response to provider referral for case management and/or care coordination services.   Consent to Services:  The patient was given information about Chronic Care Management services, agreed to services, and gave verbal consent prior to initiation of services.  Please see initial visit note for detailed documentation.   Patient agreed to services and verbal consent obtained.   Assessment: Review of patient past medical history, allergies, medications, health status, including review of consultants reports, laboratory and other test data, was performed as part of comprehensive evaluation and provision of chronic care management services.   SDOH (Social Determinants of Health) assessments and interventions performed:    CCM Care Plan  Allergies  Allergen Reactions  . Other Rash    Use paper tape.   . Tape Rash    Use paper tape.   . Elemental Sulfur Rash    Outpatient Encounter Medications as of 03/26/2020  Medication Sig Note  . albuterol (VENTOLIN HFA) 108 (90 Base) MCG/ACT inhaler Inhale 2 puffs into the lungs every 6 (six) hours as needed for wheezing or shortness of breath.   Marland Kitchen apixaban (ELIQUIS) 5 MG TABS tablet Take 1 tablet (5 mg total) by mouth 2 (two) times daily.   Marland Kitchen ascorbic acid (VITAMIN C) 500 MG tablet Take 1 tablet (500 mg total) by mouth daily.   . blood glucose meter kit and supplies Dispense based on patient and insurance preference. Use up to four times daily as directed. (FOR ICD-10 E10.9, E11.9).   . fexofenadine-pseudoephedrine (ALLEGRA-D 24) 180-240 MG 24 hr tablet  Take 1 tablet by mouth every evening. For allergy and congestion   . fluorouracil (EFUDEX) 5 % cream APPLY A SMALL AMOUNT OF CREAM TOPICALLY TWICE DAILY TO RIGHT EAR FOR 3 WEEKS   . fluticasone (FLONASE) 50 MCG/ACT nasal spray USE ONE SPRAY(S) IN EACH NOSTRIL ONCE DAILY   . furosemide (LASIX) 40 MG tablet Take 1 tablet (40 mg total) by mouth 2 (two) times daily. One at breakfast, the other 4-6 hours later.   . insulin NPH Human (NOVOLIN N) 100 UNIT/ML injection 30 units sq AC breakfast, 40 units sq ac supper 03/12/2020: 03/12/2020 Per daughter, [name], taking Relion N 27 units in the morning and 27 units qhs  . Insulin Regular Human 100 UNIT/ML SOPN Inject 12 units before lunch and 7 units before supper. 03/12/2020: 03/12/2020 Per daughter, [name], taking Relion R 20 units TID with meals (when meals are eaten)  . levofloxacin (LEVAQUIN) 500 MG tablet Take 1 tablet (500 mg total) by mouth daily. For 10 days   . loratadine (CLARITIN) 10 MG tablet Take by mouth.   . metoprolol tartrate (LOPRESSOR) 50 MG tablet Take 1 tablet (50 mg total) by mouth 2 (two) times daily.   . mirtazapine (REMERON) 15 MG tablet Take 1 tablet (15 mg total) by mouth at bedtime. For sleep   . mycophenolate (CELLCEPT) 250 MG capsule TAKE 2 CAPSULES BY MOUTH TWO TIMES DAILY.   Marland Kitchen potassium chloride SA (KLOR-CON) 20 MEQ tablet Take 1 tablet (20 mEq total) by mouth daily. For potassium replacement/ supplement   .  predniSONE (DELTASONE) 10 MG tablet Take 5 daily for 3 days followed by 4,3,2 and 1 for 3 days each.   Daryll Brod INSULIN SYRINGE 31G X 15/64" 0.5 ML MISC USE AS DIRECTED FOR INSULIN   . sodium bicarbonate 650 MG tablet Take 650 mg by mouth 2 (two) times daily.   . tacrolimus (PROGRAF) 0.5 MG capsule TAKE 2 CAPSULES BY MOUTH TWO TIMES DAILY.   Marland Kitchen zinc sulfate 220 (50 Zn) MG capsule Take 1 capsule (220 mg total) by mouth daily.    No facility-administered encounter medications on file as of 03/26/2020.    Patient Active Problem List    Diagnosis Date Noted  . Persistent atrial fibrillation ([place]) 03/11/2020  . Atrial fibrillation with RVR ([place]) 03/11/2020  . Unspecified atrial fibrillation ([place]) 02/17/2020  . NSTEMI (non-ST elevated myocardial infarction) ([place]) 02/13/2020  . Systolic and diastolic CHF, acute ([place]) 02/13/2020  . Thrombocytopenia ([place]) 02/13/2020  . AKI (acute kidney injury) ([place]) 02/13/2020  . Pneumonia due to COVID-19 virus 02/12/2020  . Hyperglycemia due to diabetes mellitus ([place]) 02/12/2020  . Acute respiratory failure with hypoxia ([place]) 02/12/2020  . CKD (chronic kidney disease), stage III ([place]) 02/12/2020  . Essential hypertension 11/22/2018  . Insulin dependent type 2 diabetes mellitus ([place]) 11/22/2018  . 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 Mixed hyperlipidemia 11/22/2018  . Vision decreased 11/22/2018  . Hyponatremia 11/22/2018  . Seasonal allergic rhinitis due to pollen 11/22/2018    Conditions to be addressed/monitored: HTN, DM, HLD, CKD, Afib, hx of MI, recent covid infection with pneumonia  Care Plan : RN: General Plan of Care (Adult)  Updates made by Ilean China, RN since 03/26/2020 12:00 AM    Problem: Quality of Life (General Plan of Care)     Goal: Quality of Life Improved   Start Date: 03/12/2020  This Visit's Progress: On track  Recent Progress: Not on track  Priority: High  Note:   Current Barriers:  . Care Coordination needs related to improving quality of life and independence in a patient with HTN, HLD, CKD, DM, AFib, hx of MI, recent covid infection with pneumonia, and new onset left sided weakness . Unable to perform ADLs independently . Unable to perform IADLs independently,   Nurse Case Manager Clinical Goal(s):  Marland Kitchen Over the next 90 days, patient will work with Consulting civil engineer to address needs related to left sided weakness and decreased ability to provide care for himself. . Over the next 60 days, patient will work with home health physical therapy, occupational  therapy, and nursing to improve strength, mobility, and ability to perform ADLs independently.   Interventions:  . 1:1 collaboration with Claretta Fraise, MD regarding development and update of comprehensive plan of care as evidenced by provider attestation and co-signature . Inter-disciplinary care team collaboration (see longitudinal plan of care) . Chart reviewed including relevant office notes, referral notes, lab results, orders, and imaging reports . Previously discussed mobility and ability to perform ADLs. o Patient uses a standard walker in the home and has a wheelchair for appointments o Ramp at the front door o Able to feed himself but food has to be prepared for him o Able to dress, bath, and go to the bathroom with assistance . Discussed home health services with daughter, [name] o Patient is receiving nursing services for wound care and general nursing needs - Wounds are legs are looking much better. They are cleaning with NS, applying antibiotic ointment, and covering with tegarderm - Duoderm sent  to pharmacy but home health nurse doesn't feel that it is needed for this particular wound. Will readdress if wound does not continue to improve or if it worsens.  o Receiving occupational and physical therapy - Working to improve strength, balance, and mobility as well as working on ADLs - Order for rolling walker was requested but PT does not think that is appropriate at this time . Will wait until he is stronger and has better balance before purchasing this . Reviewed upcoming appointment for MRI that is scheduled at  [facility] for tomorrow o No transportation issues . Previously discussed family/social support o Two daughters and one son o Lives alone but [name] has been staying with him overnight for the past week since his fall o [name] also provides most of his support and care during the day. She has some help from [place] during the week and on the weekends. Clay isn't able to provide  much assistance due to his own medical conditions but does visit often.  o Family is able to provide transportation, buy groceries, provide meals, help with ADLs . Discussed diet  o Had poor appetite after Covid infection but that has improved. Daughter states that appetite is normal now. o Reinforced diabetic diet for blood sugar management - Proper nutrition with protein, vegetables, and complex carbs will provide energy and will help to improve strength and healing . Medications reviewed and discussed . Encouraged to talk with LCSW regarding psychosocial needs . Provided with RNCM contact number and encouraged to reach out as needed . Encouraged to reach out to PCP with any new or worsening symptoms  Patient Goals/Self-Care Activities Over the next 30 days, patient will: . Work with home health nursing, physical therapy, and occupational therapy to improve strength, balance, and overall health . Work with Consulting civil engineer to discuss ways to self-manage medical conditions [phone] . Work with LCSW to discuss coping mechanisms and stress management [phone] . Keep appointment for MRI scheduled for 03/27/20 at [facility] and all medical appointments . Reach out to PCP with any new or worsening problems [phone] . Eat meals at regular intervals in order to maintain blood sugar levels and strength     Care Plan : RNCM: Diabetes Type 2 (Adult)  Updates made by Ilean China, RN since 03/26/2020 12:00 AM    Problem: Glycemic Management (Diabetes, Type 2)   Priority: Medium    Long-Range Goal: Glycemic Management Optimized   Start Date: 03/12/2020  This Visit's Progress: Not on track  Recent Progress: Not on track  Priority: Medium  Note:   Current Barriers:  . Chronic Disease Management support and education needs related to diabetes in a patient with HTN, HLD, CKD, Afib, hx of MI . Unable to perform ADLs independently . Unable to perform IADLs independently  Nurse Case  Manager Clinical Goal(s):  Marland Kitchen Over the next 90 days, patient will work with Consulting civil engineer to address needs related to self-management of diabetes . Over the next 90 days, patient will demonstrate improved adherence to prescribed treatment plan for diabetes as evidenced bydaily blood sugar readings within recommended range.  Interventions:  . 1:1 collaboration with Claretta Fraise, MD regarding development and update of comprehensive plan of care as evidenced by provider attestation and co-signature . Inter-disciplinary care team collaboration (see longitudinal plan of care) . Evaluation of current treatment plan related to daibetes and patient's adherence to plan as established by provider. . Chart reviewed including relevant office notes and lab  results . Reviewed and discussed medications o No recent changes . Discussed home blood sugar monitoring o Testing 4 to 6 times a day o Typically 100-150 fasting o 200-300 during the day o No episodes below 70 over the past two weeks . Continue to check and record blood sugar at least 4 times a day and call PCP with any readings outside of recommended range . Discussed diet o Previously poor appetite due to altered sense of taste but appetite has returned to normal now o Reinforced diabetic diet and recommended to provide meals at regular intervals  . Reviewed upcoming appointments . Encouraged to reach out to [facility] as needed  Patient Goals/Self-Care Activities Over the next 30 days, patient will: . check blood sugar at prescribed times . check blood sugar if I feel it is too high or too low . enter blood sugar readings and medication or insulin into daily log . take the blood sugar log to all doctor visits  . Call PCP [phone] with any readings outside of recommended range . Eat meals at regular intervals . Follow a diabetic diet for blood sugar control . Reach out to [place] with any questions [phone]        Follow Up Plan:  . Telephone follow up appointment with care management team member scheduled for: 04/11/20 with RN Care Manager, 04/15/20 with LCSW . The patient has been provided with contact information for the care management team and has been advised to call with any health related questions or concerns.  . Next PCP appointment scheduled for: 04/03/20 with Dr Livia Snellen . Next cardio appointment scheduled for 04/04/20 . MRI scheduled at [facility] for 03/27/20  [name], BSN, RN-BC [facility] Direct Dial: [phone]

## 2020-03-27 ENCOUNTER — Emergency Department (HOSPITAL_COMMUNITY): Payer: Medicare Other

## 2020-03-27 ENCOUNTER — Emergency Department (HOSPITAL_COMMUNITY)
Admission: EM | Admit: 2020-03-27 | Discharge: 2020-03-27 | Disposition: A | Discharge status: Home / Self Care | Payer: Medicare Other | Attending: Emergency Medicine | Admitting: Emergency Medicine

## 2020-03-27 ENCOUNTER — Emergency Department (HOSPITAL_COMMUNITY)
Admission: RE | Admit: 2020-03-27 | Discharge: 2020-03-27 | Disposition: A | Discharge status: Home / Self Care | Payer: Medicare Other | Source: Ambulatory Visit | Attending: Family Medicine | Admitting: Family Medicine

## 2020-03-27 ENCOUNTER — Other Ambulatory Visit: Payer: Self-pay

## 2020-03-27 ENCOUNTER — Ambulatory Visit (HOSPITAL_COMMUNITY)
Admission: RE | Admit: 2020-03-27 | Discharge: 2020-03-27 | Disposition: A | Discharge status: Home / Self Care | Payer: Medicare Other | Source: Ambulatory Visit | Attending: Family Medicine | Admitting: Family Medicine

## 2020-03-27 ENCOUNTER — Other Ambulatory Visit: Payer: Self-pay | Admitting: Family Medicine

## 2020-03-27 ENCOUNTER — Telehealth: Payer: Self-pay

## 2020-03-27 ENCOUNTER — Ambulatory Visit: Payer: Medicare Other | Admitting: *Deleted

## 2020-03-27 ENCOUNTER — Encounter (HOSPITAL_COMMUNITY): Payer: Self-pay | Admitting: Radiology

## 2020-03-27 DIAGNOSIS — G479 Sleep disorder, unspecified: Secondary | ICD-10-CM

## 2020-03-27 DIAGNOSIS — I5040 Unspecified combined systolic (congestive) and diastolic (congestive) heart failure: Secondary | ICD-10-CM | POA: Insufficient documentation

## 2020-03-27 DIAGNOSIS — Z85828 Personal history of other malignant neoplasm of skin: Secondary | ICD-10-CM | POA: Insufficient documentation

## 2020-03-27 DIAGNOSIS — Z7901 Long term (current) use of anticoagulants: Secondary | ICD-10-CM | POA: Insufficient documentation

## 2020-03-27 DIAGNOSIS — G9389 Other specified disorders of brain: Secondary | ICD-10-CM | POA: Insufficient documentation

## 2020-03-27 DIAGNOSIS — I639 Cerebral infarction, unspecified: Secondary | ICD-10-CM

## 2020-03-27 DIAGNOSIS — Z95828 Presence of other vascular implants and grafts: Secondary | ICD-10-CM | POA: Diagnosis not present

## 2020-03-27 DIAGNOSIS — F17290 Nicotine dependence, other tobacco product, uncomplicated: Secondary | ICD-10-CM | POA: Diagnosis not present

## 2020-03-27 DIAGNOSIS — R609 Edema, unspecified: Secondary | ICD-10-CM | POA: Insufficient documentation

## 2020-03-27 DIAGNOSIS — Z8616 Personal history of COVID-19: Secondary | ICD-10-CM | POA: Diagnosis not present

## 2020-03-27 DIAGNOSIS — R531 Weakness: Secondary | ICD-10-CM

## 2020-03-27 DIAGNOSIS — Z794 Long term (current) use of insulin: Secondary | ICD-10-CM | POA: Insufficient documentation

## 2020-03-27 DIAGNOSIS — E11649 Type 2 diabetes mellitus with hypoglycemia without coma: Secondary | ICD-10-CM | POA: Insufficient documentation

## 2020-03-27 DIAGNOSIS — N179 Acute kidney failure, unspecified: Secondary | ICD-10-CM | POA: Insufficient documentation

## 2020-03-27 DIAGNOSIS — I110 Hypertensive heart disease with heart failure: Secondary | ICD-10-CM | POA: Diagnosis not present

## 2020-03-27 DIAGNOSIS — Z940 Kidney transplant status: Secondary | ICD-10-CM | POA: Insufficient documentation

## 2020-03-27 DIAGNOSIS — I129 Hypertensive chronic kidney disease with stage 1 through stage 4 chronic kidney disease, or unspecified chronic kidney disease: Secondary | ICD-10-CM | POA: Insufficient documentation

## 2020-03-27 DIAGNOSIS — R29818 Other symptoms and signs involving the nervous system: Secondary | ICD-10-CM | POA: Diagnosis present

## 2020-03-27 DIAGNOSIS — Z79899 Other long term (current) drug therapy: Secondary | ICD-10-CM | POA: Diagnosis not present

## 2020-03-27 DIAGNOSIS — N183 Chronic kidney disease, stage 3 unspecified: Secondary | ICD-10-CM | POA: Insufficient documentation

## 2020-03-27 DIAGNOSIS — G4700 Insomnia, unspecified: Secondary | ICD-10-CM

## 2020-03-27 LAB — COMPREHENSIVE METABOLIC PANEL
ALT: 20 U/L (ref 0–44)
AST: 21 U/L (ref 15–41)
Albumin: 3 g/dL — ABNORMAL LOW (ref 3.5–5.0)
Alkaline Phosphatase: 53 U/L (ref 38–126)
Anion gap: 7 (ref 5–15)
BUN: 43 mg/dL — ABNORMAL HIGH (ref 8–23)
CO2: 30 mmol/L (ref 22–32)
Calcium: 8.6 mg/dL — ABNORMAL LOW (ref 8.9–10.3)
Chloride: 96 mmol/L — ABNORMAL LOW (ref 98–111)
Creatinine, Ser: 1.72 mg/dL — ABNORMAL HIGH (ref 0.61–1.24)
GFR, Estimated: 41 mL/min — ABNORMAL LOW (ref >60)
Glucose, Bld: 230 mg/dL — ABNORMAL HIGH (ref 70–99)
Potassium: 4 mmol/L (ref 3.5–5.1)
Sodium: 133 mmol/L — ABNORMAL LOW (ref 135–145)
Total Bilirubin: 1.2 mg/dL (ref 0.3–1.2)
Total Protein: 5.4 g/dL — ABNORMAL LOW (ref 6.5–8.1)

## 2020-03-27 LAB — ETHANOL: Alcohol, Ethyl (B): <10 mg/dL (ref <10)

## 2020-03-27 LAB — URINALYSIS, ROUTINE W REFLEX MICROSCOPIC
Bacteria, UA: NONE SEEN
Bilirubin Urine: NEGATIVE
Glucose, UA: NEGATIVE mg/dL
Hgb urine dipstick: NEGATIVE
Ketones, ur: NEGATIVE mg/dL
Leukocytes,Ua: NEGATIVE
Nitrite: NEGATIVE
Protein, ur: 100 mg/dL — AB
Specific Gravity, Urine: 1.008 (ref 1.005–1.030)
pH: 7 (ref 5.0–8.0)

## 2020-03-27 LAB — DIFFERENTIAL
Abs Immature Granulocytes: 0.15 10*3/uL — ABNORMAL HIGH (ref 0.00–0.07)
Basophils Absolute: 0 10*3/uL (ref 0.0–0.1)
Basophils Relative: 1 %
Eosinophils Absolute: 0.1 10*3/uL (ref 0.0–0.5)
Eosinophils Relative: 2 %
Immature Granulocytes: 3 %
Lymphocytes Relative: 25 %
Lymphs Abs: 1.3 10*3/uL (ref 0.7–4.0)
Monocytes Absolute: 0.4 10*3/uL (ref 0.1–1.0)
Monocytes Relative: 7 %
Neutro Abs: 3.3 10*3/uL (ref 1.7–7.7)
Neutrophils Relative %: 62 %

## 2020-03-27 LAB — CBC
HCT: 44.6 % (ref 39.0–52.0)
Hemoglobin: 13.9 g/dL (ref 13.0–17.0)
MCH: 29.3 pg (ref 26.0–34.0)
MCHC: 31.2 g/dL (ref 30.0–36.0)
MCV: 94.1 fL (ref 80.0–100.0)
Platelets: 89 10*3/uL — ABNORMAL LOW (ref 150–400)
RBC: 4.74 MIL/uL (ref 4.22–5.81)
RDW: 16.2 % — ABNORMAL HIGH (ref 11.5–15.5)
WBC: 5.2 10*3/uL (ref 4.0–10.5)
nRBC: 0 % (ref 0.0–0.2)

## 2020-03-27 LAB — RAPID URINE DRUG SCREEN, HOSP PERFORMED
Amphetamines: NOT DETECTED
Barbiturates: NOT DETECTED
Benzodiazepines: NOT DETECTED
Cocaine: NOT DETECTED
Opiates: NOT DETECTED
Tetrahydrocannabinol: NOT DETECTED

## 2020-03-27 LAB — PROTIME-INR
INR: 1.3 — ABNORMAL HIGH (ref 0.8–1.2)
Prothrombin Time: 15.8 seconds — ABNORMAL HIGH (ref 11.4–15.2)

## 2020-03-27 LAB — CBG MONITORING, ED: Glucose-Capillary: 235 mg/dL — ABNORMAL HIGH (ref 70–99)

## 2020-03-27 LAB — APTT: aPTT: 27 seconds (ref 24–36)

## 2020-03-27 MED ORDER — TRAZODONE HCL 150 MG PO TABS: 150.0000 mg | ORAL_TABLET | Freq: Every day | ORAL | 2 refills | Status: DC

## 2020-03-27 MED ORDER — IOHEXOL 350 MG/ML SOLN
50.0000 mL | Freq: Once | INTRAVENOUS | Status: AC | PRN
  Administered 2020-03-27: 50 mL via INTRAVENOUS

## 2020-03-27 MED ORDER — ATORVASTATIN CALCIUM 10 MG PO TABS: 10.0000 mg | ORAL_TABLET | Freq: Every day | ORAL | 1 refills | Status: DC

## 2020-03-27 MED ORDER — GADOBUTROL 1 MMOL/ML IV SOLN
10.0000 mL | Freq: Once | INTRAVENOUS | Status: AC | PRN
  Administered 2020-03-27: 10 mL via INTRAVENOUS

## 2020-03-27 NOTE — Telephone Encounter (Signed)
MRI tech at [facility] called to notify us that MRI of brian was positive for stroke signs and requesting advise on if our office wanted to proceed with MRA while patient still on the table and then to send ED.  Per [employer] - verbal given to go ahead with MRA exam and send orders in epic for him to sign and for patient to go to ED after.

## 2020-03-27 NOTE — Discharge Instructions (Signed)
Discussed with Dr. Merlene Laughter all our neurologist.  He will see you in the office call tomorrow to make an appointment.  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  Return for any new or worse symptoms.  Start a baby aspirin a day start the Lipitor as directed.  Continue all his current medications.

## 2020-03-27 NOTE — Telephone Encounter (Signed)
Left message to call back  

## 2020-03-27 NOTE — Telephone Encounter (Signed)
03/27/2020  I talked with Cecille Rubin this morning. He is taking the mirtazapine and he'll go to sleep for 2-3 hours but then he is up for the rest of the night. He is a little confused about what time it is and will ask about that several times during the night. He's trying to do daytime activities, like his exercises, and they have to remind him that it's the middle of the night. She says that he didn't sleep at all on Friday night. She's worried because she knows he needs to sleep well in order to heal and get better and it's also hard on her and her sister being up all night.

## 2020-03-27 NOTE — ED Notes (Signed)
Patient transported to CT 

## 2020-03-27 NOTE — ED Provider Notes (Signed)
[facility] Provider Note   CSN: [id number] Arrival date & time: 03/27/20  1459     History Chief Complaint  Patient presents with  . Stroke Symptoms    Daniel Myers is a 75 y.o. male.  Patient's primary care doctor had ordered outpatient MRI/MRA.  Which showed evidence of subacute and acute infarcts.  However historically patient symptoms started with left-sided weakness on January 11 may have started on January 10 shortly after he was recently discharged.  Patient was diagnosed with COVID in early January.  Patient was admitted for cardiac problems at [facility].  Appears that patient probably had stroke that occurred somewhere around that timeframe.  Nothing new or worse and his neurologic exam or functioning according to family.  No speech problems.  May be some questionable left-sided visual issue.        Past Medical History:  Diagnosis Date  . Basal cell carcinoma 06/27/2013   nodular on left jawline - excision  . Basal cell carcinoma 07/01/2014   nodular on left hawling - CX3+5FU+excision  . Basal cell carcinoma 10/10/2014   nodular on right forearm, middle - tx p bx  . Basal cell carcinoma 02/11/2015   left neck - CX3 + excision  . Basal cell carcinoma 06/14/2016   left jawline - CX3+5FU  . Basal cell carcinoma 02/22/2017   superficial and nodular on left neck - excision  . Basal cell carcinoma 04/11/2018   superficial and nodular on right inferior forearm - CX3+Cautery+5FU  . Chronic kidney disease   . History of renal transplant   . Hyperlipidemia   . Hypertension   . 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 Squamous cell carcinoma of skin 11/08/2011   right upper forearm - tx p bx  . Squamous cell carcinoma of skin 11/08/2011   left upper  forearm - tx p bx  . Squamous cell carcinoma of skin 11/08/2011   left hand - tx p bx  . 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/27/2013   in situ on left lower back - CX3+5FU  . 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/27/2013   in situ on left forehead - CX3+5FU  . 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/27/2013   in situ on right temple - watch per ST  . 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/27/2013   in situ on right forearm - CX3+5FU  . Squamous cell carcinoma of skin 07/01/2014   well differentiated on right sideburn - CX3+5FU  . Squamous cell carcinoma of skin 07/01/2014   in situ on left shoulder - CX3+5FU  . Squamous cell carcinoma of skin 07/01/2014   in situ on right forearm, proximal - CX3+5FU+Cautery  . Squamous cell carcinoma of skin 07/01/2014   in situ on right forearm, distal - CX3+5FU  . Squamous cell carcinoma of skin 09/30/2015   in situ on posterior left ear - CX3+5FU  . Squamous cell carcinoma of skin 02/22/2017   in situ on right upper arm - CX3+5FU  . Squamous cell carcinoma of skin 02/22/2017   in situ on left upper arm - CX3+5FU  . Squamous cell carcinoma of skin 04/11/2018   in situ on right temple - CX3+5FU  . Squamous cell carcinoma of skin  04/11/2018   in situ on lateral right arm - MOHs  . Squamous cell carcinoma of skin 04/11/2018   in situ on right upper arm - MOHs  . Squamous cell carcinoma of skin 04/11/2018   in situ on left sideburn  . Squamous cell carcinoma of skin 04/11/2018   in situ on right flank - tx p bx  . Squamous cell carcinoma of skin 09/28/2018   in situ on right inner ear - tx p bx  . Squamous cell carcinoma of skin 09/28/2018   in situ on left inner ear - tx p bx  . Squamous cell carcinoma of skin 09/28/2018   in situ on right arm - tx p bx  . Squamous cell carcinoma of skin 03/21/2019   in situ on right antihelix ([place] treated topically)  . Squamous cell carcinoma of skin 03/21/2019   in situ on right neck - CX3+5FU  . Squamous cell  carcinoma of skin 03/21/2019   in situ on left outer eye, inf (MOHs done 05/23/2019)  . Type 2 diabetes mellitus ([place])   . Unspecified atrial fibrillation ([place]) 02/17/2020    Patient Active Problem List   Diagnosis Date Noted  . Persistent atrial fibrillation ([place]) 03/11/2020  . Atrial fibrillation with RVR ([place]) 03/11/2020  . Unspecified atrial fibrillation ([place]) 02/17/2020  . NSTEMI (non-ST elevated myocardial infarction) ([place]) 02/13/2020  . Systolic and diastolic CHF, acute ([place]) 02/13/2020  . Thrombocytopenia ([place]) 02/13/2020  . AKI (acute kidney injury) ([place]) 02/13/2020  . Pneumonia due to COVID-19 virus 02/12/2020  . Hyperglycemia due to diabetes mellitus ([place]) 02/12/2020  . Acute respiratory failure with hypoxia ([place]) 02/12/2020  . CKD (chronic kidney disease), stage III ([place]) 02/12/2020  . Essential hypertension 11/22/2018  . Insulin dependent type 2 diabetes mellitus ([place]) 11/22/2018  . 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 Mixed hyperlipidemia 11/22/2018  . Vision decreased 11/22/2018  . Hyponatremia 11/22/2018  . Seasonal allergic rhinitis due to pollen 11/22/2018    Past Surgical History:  Procedure Laterality Date  . KIDNEY TRANSPLANT Right        Family History  Problem Relation Age of Onset  . Clotting disorder Mother   . Hypertension Sister   . Diabetes Sister     Social History   Tobacco Use  . Smoking status: Current Some Day Smoker    Types: Pipe  . Smokeless tobacco: Never Used  Vaping Use  . Vaping Use: Never used  Substance Use Topics  . Alcohol use: Never  . Drug use: Never    Home Medications Prior to Admission medications   Medication Sig Start Date End Date Taking? Authorizing Provider  albuterol (VENTOLIN HFA) 108 (90 Base) MCG/ACT inhaler Inhale 2 puffs into the lungs every 6 (six) hours as needed for wheezing or shortness of breath. 03/20/20  Yes Stacks, [name], MD  apixaban (ELIQUIS) 5 MG TABS tablet Take 1 tablet (5 mg total)  by mouth 2 (two) times daily. 02/18/20  Yes Samuella Cota, MD  ascorbic acid (VITAMIN C) 500 MG tablet Take 1 tablet (500 mg total) by mouth daily. 02/19/20  Yes Samuella Cota, MD  atorvastatin (LIPITOR) 10 MG tablet Take 1 tablet (10 mg total) by mouth daily. 03/27/20  Yes Fredia Sorrow, MD  blood glucose meter kit and supplies Dispense based on patient and insurance preference. Use up to four times daily as directed. (FOR ICD-10 E10.9, E11.9). 10/08/19  Yes Stacks, [name], MD  fexofenadine-pseudoephedrine (ALLEGRA-D 24) 180-240 MG 24 hr  tablet Take 1 tablet by mouth every evening. For allergy and congestion 03/14/19  Yes Stacks, [name], MD  fluorouracil (EFUDEX) 5 % cream Apply 1 application topically daily. 04/16/19  Yes [provider]  fluticasone (FLONASE) 50 MCG/ACT nasal spray USE ONE SPRAY(S) IN EACH NOSTRIL ONCE DAILY 03/14/19  Yes Claretta Fraise, MD  furosemide (LASIX) 40 MG tablet Take 1 tablet (40 mg total) by mouth 2 (two) times daily. One at breakfast, the other 4-6 hours later. 03/20/20  Yes Stacks, [name], MD  insulin NPH Human (NOVOLIN N) 100 UNIT/ML injection 30 units sq AC breakfast, 40 units sq ac supper 03/14/19  Yes Stacks, [name], MD  Insulin Regular Human 100 UNIT/ML SOPN Inject 12 units before lunch and 7 units before supper. 01/06/20  Yes Stacks, [name], MD  levofloxacin (LEVAQUIN) 500 MG tablet Take 1 tablet (500 mg total) by mouth daily. For 10 days 03/21/20  Yes Stacks, [name], MD  loratadine (CLARITIN) 10 MG tablet Take 10 mg by mouth daily as needed for allergies. 10/24/06  Yes [provider]  metoprolol tartrate (LOPRESSOR) 50 MG tablet Take 1 tablet (50 mg total) by mouth 2 (two) times daily. 03/20/20  Yes Stacks, [name], MD  mycophenolate (CELLCEPT) 250 MG capsule TAKE 2 CAPSULES BY MOUTH TWO TIMES DAILY. 06/10/14  Yes [provider]  potassium chloride SA (KLOR-CON) 20 MEQ tablet Take 1 tablet (20 mEq total) by mouth daily. For potassium  replacement/ supplement 03/20/20  Yes Stacks, [name], MD  RELION INSULIN SYRINGE 31G X 15/64" 0.5 ML MISC USE AS DIRECTED FOR INSULIN 07/04/18  Yes [provider]  sodium bicarbonate 650 MG tablet Take 650 mg by mouth 2 (two) times daily. 09/26/18  Yes [provider]  tacrolimus (PROGRAF) 0.5 MG capsule TAKE 2 CAPSULES BY MOUTH TWO TIMES DAILY. 07/08/15  Yes [provider]  zinc sulfate 220 (50 Zn) MG capsule Take 1 capsule (220 mg total) by mouth daily. 02/19/20  Yes Samuella Cota, MD  predniSONE (DELTASONE) 10 MG tablet Take 5 daily for 3 days followed by 4,3,2 and 1 for 3 days each. 03/11/20   Claretta Fraise, MD  traZODone (DESYREL) 150 MG tablet Take 1 tablet (150 mg total) by mouth at bedtime. For sleep 03/27/20   Claretta Fraise, MD    Allergies    Other, Tape, and Elemental sulfur  Review of Systems   Review of Systems  Constitutional: Negative for chills and fever.  HENT: Negative for congestion, rhinorrhea and sore throat.   Eyes: Negative for visual disturbance.  Respiratory: Negative for cough and shortness of breath.   Cardiovascular: Negative for chest pain and leg swelling.  Gastrointestinal: Negative for abdominal pain, diarrhea, nausea and vomiting.  Genitourinary: Negative for dysuria.  Musculoskeletal: Negative for back pain and neck pain.  Skin: Negative for rash.  Neurological: Positive for weakness. Negative for dizziness, facial asymmetry, speech difficulty, light-headedness and headaches.  Hematological: Does not bruise/bleed easily.  Psychiatric/Behavioral: Negative for confusion.    Physical Exam Updated Vital Signs BP (!) 145/89   Pulse 71   Temp 97.9 F (36.6 C) (Oral)   Resp 18   Ht 1.778 m ('5\' 10"' )   Wt 95 kg   SpO2 91%   BMI 30.06 kg/m   Physical Exam Vitals and nursing note reviewed.  Constitutional:      Appearance: Normal appearance. He is well-developed and well-nourished.  HENT:     Head: Normocephalic and  atraumatic.  Eyes:     Extraocular  Movements: Extraocular movements intact.     Conjunctiva/sclera: Conjunctivae normal.     Pupils: Pupils are equal, round, and reactive to light.  Cardiovascular:     Rate and Rhythm: Normal rate and regular rhythm.     Heart sounds: No murmur heard.   Pulmonary:     Effort: Pulmonary effort is normal. No respiratory distress.     Breath sounds: Normal breath sounds.  Abdominal:     Palpations: Abdomen is soft.     Tenderness: There is no abdominal tenderness.  Musculoskeletal:        General: Swelling present. Normal range of motion.     Cervical back: Normal range of motion and neck supple.     Right lower leg: Edema present.     Left lower leg: Edema present.  Skin:    General: Skin is warm and dry.     Capillary Refill: Capillary refill takes less than 2 seconds.  Neurological:     Mental Status: He is alert and oriented to person, place, and time.     Cranial Nerves: No cranial nerve deficit.     Sensory: No sensory deficit.     Motor: Weakness present.     Comments: Patient with a little bit of weakness on the left side.  Left lower extremity greater than left upper extremity.  Psychiatric:        Mood and Affect: Mood and affect normal.     ED Results / Procedures / Treatments   Labs (all labs ordered are listed, but only abnormal results are displayed) Labs Reviewed  PROTIME-INR - Abnormal; Notable for the following components:      Result Value   Prothrombin Time 15.8 (*)    INR 1.3 (*)    All other components within normal limits  CBC - Abnormal; Notable for the following components:   RDW 16.2 (*)    Platelets 89 (*)    All other components within normal limits  DIFFERENTIAL - Abnormal; Notable for the following components:   Abs Immature Granulocytes 0.15 (*)    All other components within normal limits  COMPREHENSIVE METABOLIC PANEL - Abnormal; Notable for the following components:   Sodium 133 (*)    Chloride 96 (*)     Glucose, Bld 230 (*)    BUN 43 (*)    Creatinine, Ser 1.72 (*)    Calcium 8.6 (*)    Total Protein 5.4 (*)    Albumin 3.0 (*)    GFR, Estimated 41 (*)    All other components within normal limits  URINALYSIS, ROUTINE W REFLEX MICROSCOPIC - Abnormal; Notable for the following components:   Protein, ur 100 (*)    All other components within normal limits  CBG MONITORING, ED - Abnormal; Notable for the following components:   Glucose-Capillary 235 (*)    All other components within normal limits  ETHANOL  APTT  RAPID URINE DRUG SCREEN, HOSP PERFORMED  I-STAT CHEM 8, ED    EKG None  Radiology CT Angio Head W or Wo Contrast  Result Date: 03/27/2020 CLINICAL DATA:  Scattered infarctions in the right MCA territory. EXAM: CT ANGIOGRAPHY HEAD AND NECK TECHNIQUE: Multidetector CT imaging of the head and neck was performed using the standard protocol during bolus administration of intravenous contrast. Multiplanar CT image reconstructions and MIPs were obtained to evaluate the vascular anatomy. Carotid stenosis measurements (when applicable) are obtained utilizing NASCET criteria, using the distal internal carotid diameter as the denominator. CONTRAST:  62m  OMNIPAQUE IOHEXOL 350 MG/ML SOLN COMPARISON:  MRI same day FINDINGS: CTA NECK FINDINGS Aortic arch: Aortic arch is normal. Mild atherosclerotic calcification of the proximal innominate artery. Right carotid system: Common carotid artery widely patent to the bifurcation. Soft and calcified plaque at the carotid bifurcation and ICA bulb. Minimal diameter in the ICA bulb 1 mm or less. Compared to a more distal cervical ICA expected diameter of 5 mm, this indicates an 80% or greater stenosis. Left carotid system: Common carotid artery widely patent to the bifurcation. Soft and calcified plaque at the carotid bifurcation and ICA bulb but no stenosis. Vertebral arteries: Both vertebral artery origins are patent. Both vertebral arteries appear normal  through the cervical region to the foramen magnum. Skeleton: Ordinary cervical spondylosis. Other neck: No mass or lymphadenopathy. Upper chest: Pleural effusions layering dependently, right more than left. Areas of abnormal patchy density in both upper lobes, left more than right. I would recommend a complete chest CT when able to better assess the pulmonary disease. Specifically, there could be focal infiltrate or mass lesion in the anterior left upper lobe. Review of the MIP images confirms the above findings CTA HEAD FINDINGS Anterior circulation: Both internal carotid arteries are patent through the skull base and siphon regions. Ordinary siphon atherosclerosis but no stenosis greater than 30%. There is motion degradation unfortunately in the region of the circle-of-Willis. No large vessel occlusion identifiable at this time. Posterior circulation: Both vertebral arteries are patent to the basilar. No basilar stenosis. Posterior circulation branch vessels show flow. Again, there is motion degradation that limits precise evaluation. Venous sinuses: Patent and normal. Anatomic variants: None significant. Review of the MIP images confirms the above findings IMPRESSION: 1. Motion degradation unfortunately in the region of the circle-of-Willis. No intracranial large vessel occlusion identifiable at this time. 2. Atherosclerotic disease at both carotid bifurcations and ICA bulb regions. 80% or greater stenosis of the ICA bulb on the right due to calcified and prominent soft plaque. This could certainly be the source of embolic disease in the right MCA territory. No measurable stenosis on the left. 3. Pleural effusions layering dependently, right more than left. Areas of abnormal patchy density in both upper lobes, left more than right. I would recommend a complete chest CT when able to better assess the pulmonary disease. Specifically, there could be focal infiltrate or mass lesion in the anterior left upper lobe.  Electronically Signed   By: Nelson Chimes M.D.   On: 03/27/2020 20:02   DG Eye Foreign Body  Result Date: 03/27/2020 CLINICAL DATA:  Metal working/exposure; clearance prior to MRI EXAM: ORBITS FOR FOREIGN BODY - 2 VIEW COMPARISON:  No prior. FINDINGS: No metallic foreign bodies noted. Patient cleared for MRI. Mucosal thickening right maxillary sinus. No acute bony abnormality. IMPRESSION: No evidence of metallic foreign body. Patient cleared for MRI. Electronically Signed   By: Marcello Moores  Register   On: 03/27/2020 15:05   CT Angio Neck W and/or Wo Contrast  Result Date: 03/27/2020 CLINICAL DATA:  Scattered infarctions in the right MCA territory. EXAM: CT ANGIOGRAPHY HEAD AND NECK TECHNIQUE: Multidetector CT imaging of the head and neck was performed using the standard protocol during bolus administration of intravenous contrast. Multiplanar CT image reconstructions and MIPs were obtained to evaluate the vascular anatomy. Carotid stenosis measurements (when applicable) are obtained utilizing NASCET criteria, using the distal internal carotid diameter as the denominator. CONTRAST:  79m OMNIPAQUE IOHEXOL 350 MG/ML SOLN COMPARISON:  MRI same day FINDINGS: CTA NECK FINDINGS Aortic  arch: Aortic arch is normal. Mild atherosclerotic calcification of the proximal innominate artery. Right carotid system: Common carotid artery widely patent to the bifurcation. Soft and calcified plaque at the carotid bifurcation and ICA bulb. Minimal diameter in the ICA bulb 1 mm or less. Compared to a more distal cervical ICA expected diameter of 5 mm, this indicates an 80% or greater stenosis. Left carotid system: Common carotid artery widely patent to the bifurcation. Soft and calcified plaque at the carotid bifurcation and ICA bulb but no stenosis. Vertebral arteries: Both vertebral artery origins are patent. Both vertebral arteries appear normal through the cervical region to the foramen magnum. Skeleton: Ordinary cervical  spondylosis. Other neck: No mass or lymphadenopathy. Upper chest: Pleural effusions layering dependently, right more than left. Areas of abnormal patchy density in both upper lobes, left more than right. I would recommend a complete chest CT when able to better assess the pulmonary disease. Specifically, there could be focal infiltrate or mass lesion in the anterior left upper lobe. Review of the MIP images confirms the above findings CTA HEAD FINDINGS Anterior circulation: Both internal carotid arteries are patent through the skull base and siphon regions. Ordinary siphon atherosclerosis but no stenosis greater than 30%. There is motion degradation unfortunately in the region of the circle-of-Willis. No large vessel occlusion identifiable at this time. Posterior circulation: Both vertebral arteries are patent to the basilar. No basilar stenosis. Posterior circulation branch vessels show flow. Again, there is motion degradation that limits precise evaluation. Venous sinuses: Patent and normal. Anatomic variants: None significant. Review of the MIP images confirms the above findings IMPRESSION: 1. Motion degradation unfortunately in the region of the circle-of-Willis. No intracranial large vessel occlusion identifiable at this time. 2. Atherosclerotic disease at both carotid bifurcations and ICA bulb regions. 80% or greater stenosis of the ICA bulb on the right due to calcified and prominent soft plaque. This could certainly be the source of embolic disease in the right MCA territory. No measurable stenosis on the left. 3. Pleural effusions layering dependently, right more than left. Areas of abnormal patchy density in both upper lobes, left more than right. I would recommend a complete chest CT when able to better assess the pulmonary disease. Specifically, there could be focal infiltrate or mass lesion in the anterior left upper lobe. Electronically Signed   By: Nelson Chimes M.D.   On: 03/27/2020 20:02   CT Chest  Wo Contrast  Result Date: 03/27/2020 CLINICAL DATA:  COPD, abnormal upper chest on earlier CT angiography of the neck EXAM: CT CHEST WITHOUT CONTRAST TECHNIQUE: Multidetector CT imaging of the chest was performed following the standard protocol without IV contrast. COMPARISON:  03/20/2020, 03/27/2020 FINDINGS: Cardiovascular: Unenhanced imaging of the heart and great vessels demonstrates cardiomegaly without pericardial effusion. Normal caliber of the thoracic aorta. Extensive atherosclerosis of the coronary vasculature. Mediastinum/Nodes: No enlarged mediastinal or axillary lymph nodes. Thyroid gland, trachea, and esophagus demonstrate no significant findings. Lungs/Pleura: There are small bilateral pleural effusions, right greater than left. There is multifocal bilateral airspace disease, with relative sparing of the right upper lobe. Diffuse bronchial wall thickening. No pneumothorax. The central airways are patent. Upper Abdomen: Cholelithiasis without cholecystitis. No other acute upper abdominal findings. Musculoskeletal: No acute or destructive bony lesions. Reconstructed images demonstrate no additional findings. IMPRESSION: 1. Multifocal bilateral airspace disease consistent with bronchopneumonia. 2. Small bilateral pleural effusions, right greater than left. 3. Cholelithiasis without cholecystitis. 4. Cardiomegaly, with coronary artery atherosclerosis. Electronically Signed   By: Randa Ngo  M.D.   On: 03/27/2020 21:04   MR ANGIO HEAD WO CONTRAST  Result Date: 03/27/2020 CLINICAL DATA:  Difficulty walking and visual disturbance since January. EXAM: MRI HEAD WITHOUT AND WITH CONTRAST MRA HEAD WITHOUT CONTRAST TECHNIQUE: Multiplanar, multiecho pulse sequences of the brain and surrounding structures were obtained without and with intravenous contrast. Angiographic images of the head were obtained using MRA technique without contrast. CONTRAST:  71m GADAVIST GADOBUTROL 1 MMOL/ML IV SOLN COMPARISON:   None. FINDINGS: MRI HEAD FINDINGS Brain: No abnormality affects the brainstem or cerebellum. Left cerebral hemisphere shows minimal small vessel change of the white matter. Right cerebral hemisphere shows numerous scattered acute to subacute infarctions in the right middle cerebral artery territory. The largest infarction is a 3 cm infarction in the frontal operculum with mild swelling and minimal petechial blood products. Other smaller acute infarcts are present within the right frontal and parietal region affecting the cortex as well as the subcortical white matter. Findings are consistent with micro embolic infarctions in the right MCA territory. No other vascular territories affected. No evidence of shift. No sign of tumor, hydrocephalus or extra-axial collection. After contrast administration, there is low level enhancement in the regions of infarction, further evidence subacute nature. No abnormal enhancement otherwise. Vascular: Major vessels at the base of the brain show flow. Skull and upper cervical spine: Negative Sinuses/Orbits: Mucosal inflammatory changes of the maxillary sinuses, left more than right. Some ethmoid sinus inflammation on the left. Orbits negative. Other: None MRA HEAD FINDINGS Both internal carotid arteries are widely patent through the skull base and siphon regions. Left anterior and middle cerebral vessels are normal. On the right, there is marked narrowing and diminished flow within the anterior temporal branch. Both vertebral arteries are widely patent to the basilar. No basilar stenosis. There is diminished flow in the distal left PCA branches relative to the right. This could represent reactive hyperemia due to the right MCA region ischemic changes. IMPRESSION: 1. Numerous scattered acute to subacute infarctions in the right middle cerebral artery territory. The largest infarction is a 3 cm infarction in the frontal operculum with mild swelling and petechial blood products. Other  smaller acute infarcts are present within the right frontal and parietal region affecting the cortex as well as the subcortical white matter. Findings are consistent with micro embolic infarctions in the right MCA territory. 2. MR angiography shows marked narrowing and diminished flow within the right MCA anterior temporal branch. There is diminished flow in the distal left PCA branches relative to the right, which could represent reactive hyperemia due to the right MCA region ischemic changes. Electronically Signed   By: MNelson ChimesM.D.   On: 03/27/2020 15:38   MR BRAIN W WO CONTRAST  Result Date: 03/27/2020 CLINICAL DATA:  Difficulty walking and visual disturbance since January. EXAM: MRI HEAD WITHOUT AND WITH CONTRAST MRA HEAD WITHOUT CONTRAST TECHNIQUE: Multiplanar, multiecho pulse sequences of the brain and surrounding structures were obtained without and with intravenous contrast. Angiographic images of the head were obtained using MRA technique without contrast. CONTRAST:  137mGADAVIST GADOBUTROL 1 MMOL/ML IV SOLN COMPARISON:  None. FINDINGS: MRI HEAD FINDINGS Brain: No abnormality affects the brainstem or cerebellum. Left cerebral hemisphere shows minimal small vessel change of the white matter. Right cerebral hemisphere shows numerous scattered acute to subacute infarctions in the right middle cerebral artery territory. The largest infarction is a 3 cm infarction in the frontal operculum with mild swelling and minimal petechial blood products. Other smaller acute infarcts  are present within the right frontal and parietal region affecting the cortex as well as the subcortical white matter. Findings are consistent with micro embolic infarctions in the right MCA territory. No other vascular territories affected. No evidence of shift. No sign of tumor, hydrocephalus or extra-axial collection. After contrast administration, there is low level enhancement in the regions of infarction, further evidence  subacute nature. No abnormal enhancement otherwise. Vascular: Major vessels at the base of the brain show flow. Skull and upper cervical spine: Negative Sinuses/Orbits: Mucosal inflammatory changes of the maxillary sinuses, left more than right. Some ethmoid sinus inflammation on the left. Orbits negative. Other: None MRA HEAD FINDINGS Both internal carotid arteries are widely patent through the skull base and siphon regions. Left anterior and middle cerebral vessels are normal. On the right, there is marked narrowing and diminished flow within the anterior temporal branch. Both vertebral arteries are widely patent to the basilar. No basilar stenosis. There is diminished flow in the distal left PCA branches relative to the right. This could represent reactive hyperemia due to the right MCA region ischemic changes. IMPRESSION: 1. Numerous scattered acute to subacute infarctions in the right middle cerebral artery territory. The largest infarction is a 3 cm infarction in the frontal operculum with mild swelling and petechial blood products. Other smaller acute infarcts are present within the right frontal and parietal region affecting the cortex as well as the subcortical white matter. Findings are consistent with micro embolic infarctions in the right MCA territory. 2. MR angiography shows marked narrowing and diminished flow within the right MCA anterior temporal branch. There is diminished flow in the distal left PCA branches relative to the right, which could represent reactive hyperemia due to the right MCA region ischemic changes. Electronically Signed   By: Nelson Chimes M.D.   On: 03/27/2020 15:38    Procedures Procedures   CRITICAL CARE Performed by: Fredia Sorrow Total critical care time: 45 minutes Critical care time was exclusive of separately billable procedures and treating other patients. Critical care was necessary to treat or prevent imminent or life-threatening deterioration. Critical  care was time spent personally by me on the following activities: development of treatment plan with patient and/or surrogate as well as nursing, discussions with consultants, evaluation of patient's response to treatment, examination of patient, obtaining history from patient or surrogate, ordering and performing treatments and interventions, ordering and review of laboratory studies, ordering and review of radiographic studies, pulse oximetry and re-evaluation of patient's condition.   Medications Ordered in ED Medications  iohexol (OMNIPAQUE) 350 MG/ML injection 50 mL (50 mLs Intravenous Contrast Given 03/27/20 1940)    ED Course  I have reviewed the triage vital signs and the nursing notes.  Pertinent labs & imaging results that were available during my care of the patient were reviewed by me and considered in my medical decision making (see chart for details).    MDM Rules/Calculators/A&P                          Patient's MRI/MRA from the day showing subacute and acute infarcts probably secondary to emboli.  On the right side.  Discussed with Dr. Merlene Laughter neurology on-call during the daytime.  Felt that there was no reason for admission recommended getting CT angio head and neck which sort of confirms that the carotid may have some disease process on the right side as well.  He recommended a baby aspirin a day and starting him  on Lipitor and have them call the office and he would follow-up them in the office and continue work-up there.  Patient is already being treated for pneumonia.  Patient had Covid beginning in January.  Patient is on antibiotics for the pneumonia.  The CT scan of the chest just reconfirms that.  Patient is in no respiratory distress.  Patient's neuro exam is essentially from description from the family unchanged since January 11.  Just has a little bit left-sided leg weakness.  And has some difficulty with coordination with walking.    Final Clinical Impression(s)  / ED Diagnoses Final diagnoses:  Cerebrovascular accident (CVA), unspecified mechanism ([place])    Rx / DC Orders ED Discharge Orders         Ordered    atorvastatin (LIPITOR) 10 MG tablet  Daily        03/27/20 2220           Fredia Sorrow, MD 03/27/20 2235

## 2020-03-27 NOTE — ED Triage Notes (Signed)
Pt. Was brought over from MRI with stroke present on MRI. Per family member pt. Has not been steady on their feet since January 23rd. Pt. Tested positive for covid January 3rd. Per family pt. Has AFIB since covid and also had a heart attack last month.

## 2020-03-27 NOTE — Telephone Encounter (Signed)
I sent in a scrip for trazodone at a higher dose. It was tried only for a few days at a low dose before. It should be more effective at the higher dose. Mirtazapine is actually less effective for sleep as the dose increases.

## 2020-03-27 NOTE — Telephone Encounter (Signed)
The next step is to use a controlled substance. I would like to delay this as much as possible. Let's see what they find out from the E.D. today due to the apparent stroke on his MRI

## 2020-03-27 NOTE — Telephone Encounter (Signed)
They are concerned about giving him Trazodone because when he tried it before it caused his heart rate to increase and they are afraid to try this particular medication again.  Is there anything else that could be sent in instead?

## 2020-03-28 ENCOUNTER — Other Ambulatory Visit: Payer: Self-pay | Admitting: Family Medicine

## 2020-03-28 ENCOUNTER — Encounter (HOSPITAL_COMMUNITY): Payer: Medicare Other

## 2020-03-28 ENCOUNTER — Ambulatory Visit (INDEPENDENT_AMBULATORY_CARE_PROVIDER_SITE_OTHER): Payer: Medicare Other

## 2020-03-28 ENCOUNTER — Other Ambulatory Visit (HOSPITAL_COMMUNITY): Payer: Medicare Other

## 2020-03-28 ENCOUNTER — Telehealth: Payer: Self-pay | Admitting: Family Medicine

## 2020-03-28 DIAGNOSIS — I639 Cerebral infarction, unspecified: Secondary | ICD-10-CM

## 2020-03-28 DIAGNOSIS — I4891 Unspecified atrial fibrillation: Secondary | ICD-10-CM | POA: Diagnosis not present

## 2020-03-28 DIAGNOSIS — I252 Old myocardial infarction: Secondary | ICD-10-CM

## 2020-03-28 DIAGNOSIS — E1122 Type 2 diabetes mellitus with diabetic chronic kidney disease: Secondary | ICD-10-CM

## 2020-03-28 DIAGNOSIS — E785 Hyperlipidemia, unspecified: Secondary | ICD-10-CM

## 2020-03-28 DIAGNOSIS — N189 Chronic kidney disease, unspecified: Secondary | ICD-10-CM

## 2020-03-28 DIAGNOSIS — Z8616 Personal history of COVID-19: Secondary | ICD-10-CM

## 2020-03-28 DIAGNOSIS — G8194 Hemiplegia, unspecified affecting left nondominant side: Secondary | ICD-10-CM | POA: Diagnosis not present

## 2020-03-28 DIAGNOSIS — J1282 Pneumonia due to coronavirus disease 2019: Secondary | ICD-10-CM

## 2020-03-28 DIAGNOSIS — I5041 Acute combined systolic (congestive) and diastolic (congestive) heart failure: Secondary | ICD-10-CM

## 2020-03-28 DIAGNOSIS — Z7901 Long term (current) use of anticoagulants: Secondary | ICD-10-CM

## 2020-03-28 DIAGNOSIS — Z85828 Personal history of other malignant neoplasm of skin: Secondary | ICD-10-CM

## 2020-03-28 DIAGNOSIS — Z794 Long term (current) use of insulin: Secondary | ICD-10-CM

## 2020-03-28 DIAGNOSIS — Z7952 Long term (current) use of systemic steroids: Secondary | ICD-10-CM

## 2020-03-28 DIAGNOSIS — Z940 Kidney transplant status: Secondary | ICD-10-CM

## 2020-03-28 DIAGNOSIS — G479 Sleep disorder, unspecified: Secondary | ICD-10-CM

## 2020-03-28 DIAGNOSIS — I130 Hypertensive heart and chronic kidney disease with heart failure and stage 1 through stage 4 chronic kidney disease, or unspecified chronic kidney disease: Secondary | ICD-10-CM

## 2020-03-28 DIAGNOSIS — Z9181 History of falling: Secondary | ICD-10-CM

## 2020-03-28 NOTE — Telephone Encounter (Signed)
REFERRAL REQUEST Telephone Note  Have you been seen at our office for this problem? Not at [facility] - Patient was seen in the ED and they stated he needed a Referral to Neurology from his PCP (Advise that they may need an appointment with their PCP before a referral can be done)  Reason for Referral:  Referral discussed with patient: Yes Best contact number of patient for referral team:    Has patient been seen by a specialist for this issue before: No Patient provider preference for referral: Dr. Merlene Laughter Patient location preference for referral: [place]    Patient notified that referrals can take up to a week or longer to process. If they haven't heard anything within a week they should call back and speak with the referral department.

## 2020-03-28 NOTE — Telephone Encounter (Signed)
Referral placed, as requested WS 

## 2020-03-31 ENCOUNTER — Other Ambulatory Visit: Payer: Self-pay | Admitting: Family Medicine

## 2020-03-31 ENCOUNTER — Encounter: Payer: Self-pay | Admitting: Family Medicine

## 2020-03-31 MED ORDER — CEFPROZIL 500 MG PO TABS: 500.0000 mg | ORAL_TABLET | Freq: Two times a day (BID) | ORAL | 0 refills | Status: DC

## 2020-03-31 MED ORDER — AZITHROMYCIN 250 MG PO TABS: ORAL_TABLET | ORAL | 0 refills | Status: DC

## 2020-04-01 NOTE — Telephone Encounter (Signed)
Unable to reach patient.  Patient has follow up appointment with Dr. Livia Snellen in two days on 04/03/2020.

## 2020-04-03 ENCOUNTER — Other Ambulatory Visit: Payer: Self-pay

## 2020-04-03 ENCOUNTER — Ambulatory Visit (INDEPENDENT_AMBULATORY_CARE_PROVIDER_SITE_OTHER): Payer: Medicare Other | Admitting: Family Medicine

## 2020-04-03 ENCOUNTER — Encounter: Payer: Self-pay | Admitting: Family Medicine

## 2020-04-03 VITALS — Temp 97.6°F | Resp 20

## 2020-04-03 DIAGNOSIS — E782 Mixed hyperlipidemia: Secondary | ICD-10-CM | POA: Diagnosis not present

## 2020-04-03 DIAGNOSIS — Z940 Kidney transplant status: Secondary | ICD-10-CM

## 2020-04-03 DIAGNOSIS — E119 Type 2 diabetes mellitus without complications: Secondary | ICD-10-CM | POA: Diagnosis not present

## 2020-04-03 DIAGNOSIS — I6930 Unspecified sequelae of cerebral infarction: Secondary | ICD-10-CM

## 2020-04-03 DIAGNOSIS — U071 COVID-19: Secondary | ICD-10-CM

## 2020-04-03 DIAGNOSIS — Z794 Long term (current) use of insulin: Secondary | ICD-10-CM

## 2020-04-03 DIAGNOSIS — I10 Essential (primary) hypertension: Secondary | ICD-10-CM

## 2020-04-03 DIAGNOSIS — G479 Sleep disorder, unspecified: Secondary | ICD-10-CM | POA: Diagnosis not present

## 2020-04-03 DIAGNOSIS — J1282 Pneumonia due to coronavirus disease 2019: Secondary | ICD-10-CM

## 2020-04-03 LAB — BAYER DCA HB A1C WAIVED: HB A1C (BAYER DCA - WAIVED): 7 % — ABNORMAL HIGH (ref <7.0)

## 2020-04-03 MED ORDER — MIRTAZAPINE 15 MG PO TABS: 15.0000 mg | ORAL_TABLET | Freq: Every day | ORAL | 5 refills | Status: DC

## 2020-04-03 MED ORDER — ZALEPLON 10 MG PO CAPS: 10.0000 mg | ORAL_CAPSULE | Freq: Every evening | ORAL | 2 refills | Status: DC | PRN

## 2020-04-03 NOTE — Progress Notes (Signed)
Subjective:  Patient ID: Daniel Myers, male    DOB: 1945-05-03  Age: 75 y.o. MRN: [id number]  CC: Medical Management of Chronic Issues   HPI Daniel Myers presents forFollow-up of diabetes. Patient checks blood sugar at home.  No excessive highs or lows.  His caregivers are checking 6 or 7 times a day but bring no readings and there is a sense that they are higher than they would like to see. Patient denies symptoms such as polyuria, polydipsia, excessive hunger, nausea No significant hypoglycemic spells noted. Medications reviewed. Pt reports taking them regularly without complication/adverse reaction being reported today.  He continues to have chest congestion and cough.   History Harith has a past medical history of Acute respiratory failure with hypoxia ([place]) (02/12/2020), AKI (acute kidney injury) ([place]) (02/13/2020), Atrial fibrillation with RVR ([place]) (03/11/2020), Basal cell carcinoma (06/27/2013), Basal cell carcinoma (07/01/2014), Basal cell carcinoma (10/10/2014), Basal cell carcinoma (02/11/2015), Basal cell carcinoma (06/14/2016), Basal cell carcinoma (02/22/2017), Basal cell carcinoma (04/11/2018), Chronic kidney disease, History of renal transplant, Hyperlipidemia, Hypertension, NSTEMI (non-ST elevated myocardial infarction) ([place]) (02/13/2020), 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), 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), 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), Squamous cell carcinoma of skin (11/08/2011), Squamous cell carcinoma of skin (11/08/2011), Squamous cell carcinoma of skin (11/08/2011), 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), 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), 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), 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), 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), 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), 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), 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), Squamous cell  carcinoma of skin (09/30/2015), Squamous cell carcinoma of skin (02/22/2017), Squamous cell carcinoma of skin (02/22/2017), Squamous cell carcinoma of skin (04/11/2018), Squamous cell carcinoma of skin (04/11/2018), Squamous cell carcinoma of skin (04/11/2018), Squamous cell carcinoma of skin (04/11/2018), Squamous cell carcinoma of skin (04/11/2018), Squamous cell carcinoma of skin (09/28/2018), Squamous cell carcinoma of skin (09/28/2018), Squamous cell carcinoma of skin (09/28/2018), Squamous cell carcinoma of skin (03/21/2019), Squamous cell carcinoma of skin (03/21/2019), Squamous cell carcinoma of skin (03/21/2019), Type 2 diabetes mellitus ([place]), and Unspecified atrial fibrillation ([place]) (02/17/2020).   He has a past surgical history that includes Kidney transplant ([place]).   His family history includes Clotting disorder in his mother; Diabetes in his sister; Hypertension in his sister.He reports that he has been smoking pipe. He has never used smokeless tobacco. He reports that he does not drink alcohol and does not use drugs.  Current Outpatient Medications on File Prior to Visit  Medication Sig Dispense Refill  . albuterol (VENTOLIN HFA) 108 (90 Base) MCG/ACT inhaler Inhale 2 puffs into the lungs every 6 (six) hours as needed for wheezing or shortness of breath. 1 each 3  . apixaban (ELIQUIS) 5 MG TABS tablet Take 1 tablet (5 mg total) by mouth 2 (two) times daily. 60 tablet 1  . ascorbic acid (VITAMIN C) 500 MG tablet Take 1 tablet (500 mg total) by mouth daily.    Marland Kitchen aspirin EC 81 MG tablet Take 81 mg by mouth daily. Swallow whole.    Marland Kitchen atorvastatin (LIPITOR) 10 MG tablet Take 1 tablet (10 mg total) by mouth daily. 30 tablet 1  . azithromycin (ZITHROMAX Z-PAK) 250 MG tablet Take two right away Then one a day for the next 4 days. 6 each 0  . blood glucose meter kit and supplies Dispense based on patient and insurance preference. Use up to four times daily as directed. (FOR ICD-10  E10.9, E11.9).  1 each 0  . cefPROZIL (CEFZIL) 500 MG tablet Take 1 tablet (500 mg total) by mouth 2 (two) times daily. For infection. Take all of this medication. 20 tablet 0  . fexofenadine-pseudoephedrine (ALLEGRA-D 24) 180-240 MG 24 hr tablet Take 1 tablet by mouth every evening. For allergy and congestion 30 tablet 11  . fluorouracil (EFUDEX) 5 % cream Apply 1 application topically daily.    . fluticasone (FLONASE) 50 MCG/ACT nasal spray USE ONE SPRAY(S) IN EACH NOSTRIL ONCE DAILY 16 g 11  . furosemide (LASIX) 40 MG tablet Take 1 tablet (40 mg total) by mouth 2 (two) times daily. One at breakfast, the other 4-6 hours later. 30 tablet 1  . insulin NPH Human (NOVOLIN N) 100 UNIT/ML injection 30 units sq AC breakfast, 40 units sq ac supper 30 mL 11  . Insulin Regular Human 100 UNIT/ML SOPN Inject 12 units before lunch and 7 units before supper. 10 mL prn  . loratadine (CLARITIN) 10 MG tablet Take 10 mg by mouth daily as needed for allergies.    . metoprolol tartrate (LOPRESSOR) 50 MG tablet Take 1 tablet (50 mg total) by mouth 2 (two) times daily. 60 tablet 2  . mycophenolate (CELLCEPT) 250 MG capsule TAKE 2 CAPSULES BY MOUTH TWO TIMES DAILY.    Marland Kitchen potassium chloride SA (KLOR-CON) 20 MEQ tablet Take 1 tablet (20 mEq total) by mouth daily. For potassium replacement/ supplement 30 tablet 5  . predniSONE (DELTASONE) 10 MG tablet Take 5 daily for 3 days followed by 4,3,2 and 1 for 3 days each. 45 tablet 0  . RELION INSULIN SYRINGE 31G X 15/64" 0.5 ML MISC USE AS DIRECTED FOR INSULIN    . sodium bicarbonate 650 MG tablet Take 650 mg by mouth 2 (two) times daily.    . tacrolimus (PROGRAF) 0.5 MG capsule TAKE 2 CAPSULES BY MOUTH TWO TIMES DAILY.    Marland Kitchen zinc sulfate 220 (50 Zn) MG capsule Take 1 capsule (220 mg total) by mouth daily.     No current facility-administered medications on file prior to visit.    ROS Review of Systems  Constitutional: Negative for fever.  Respiratory: Positive for cough and shortness of  breath.   Cardiovascular: Positive for leg swelling. Negative for chest pain.  Musculoskeletal: Negative for arthralgias.  Skin: Negative for rash.    Objective:  Temp 97.6 F (36.4 C) (Temporal)   Resp 20   SpO2 94%   BP Readings from Last 3 Encounters:  03/27/20 (!) 145/89  03/20/20 128/75  03/11/20 136/87    Wt Readings from Last 3 Encounters:  03/27/20 209 lb 8 oz (95 kg)  03/06/20 210 lb (95.3 kg)  02/18/20 211 lb 10.3 oz (96 kg)     Physical Exam Vitals reviewed.  Constitutional:      Appearance: He is well-developed and well-nourished.  HENT:     Head: Normocephalic and atraumatic.     Right Ear: External ear normal.     Left Ear: External ear normal.     Mouth/Throat:     Pharynx: No oropharyngeal exudate or posterior oropharyngeal erythema.  Eyes:     Pupils: Pupils are equal, round, and reactive to light.  Cardiovascular:     Rate and Rhythm: Normal rate and regular rhythm.     Heart sounds: No murmur heard.   Pulmonary:     Effort: No respiratory distress.     Breath sounds: Rales (fine, Left sided) present. No wheezing or rhonchi.  Abdominal:     Palpations: Abdomen is soft. There is no mass.  Musculoskeletal:     Cervical back: Normal range of motion and neck supple.  Neurological:     Mental Status: He is alert and oriented to person, place, and time.       Assessment & Plan:   Kenyen was seen today for medical management of chronic issues.  Diagnoses and all orders for this visit:  Insulin dependent type 2 diabetes mellitus ([place]) -     Microalbumin / creatinine urine ratio -     Bayer DCA Hb A1c Waived -     CBC with Differential/Platelet -     CMP14+EGFR -     Lipid panel  Essential hypertension -     CBC with Differential/Platelet -     CMP14+EGFR -     Lipid panel  Mixed hyperlipidemia -     CBC with Differential/Platelet -     CMP14+EGFR -     Lipid panel  Sleep disturbance -     Discontinue: mirtazapine (REMERON) 15  MG tablet; Take 1 tablet (15 mg total) by mouth at bedtime. For sleep -     zaleplon (SONATA) 10 MG capsule; Take 1 capsule (10 mg total) by mouth at bedtime as needed for sleep.  Renal transplant recipient  Pneumonia due to COVID-19 virus      I have discontinued Truddie Crumble L. Guerette's traZODone and mirtazapine. I am also having him start on zaleplon. Additionally, I am having him maintain his tacrolimus, sodium bicarbonate, mycophenolate, ReliOn Insulin Syringe, loratadine, fexofenadine-pseudoephedrine, fluticasone, insulin NPH Human, fluorouracil, blood glucose meter kit and supplies, Insulin Regular Human, apixaban, zinc sulfate, ascorbic acid, predniSONE, albuterol, metoprolol tartrate, furosemide, potassium chloride SA, atorvastatin, azithromycin, cefPROZIL, and aspirin EC.  Meds ordered this encounter  Medications  . DISCONTD: mirtazapine (REMERON) 15 MG tablet    Sig: Take 1 tablet (15 mg total) by mouth at bedtime. For sleep    Dispense:  30 tablet    Refill:  5  . zaleplon (SONATA) 10 MG capsule    Sig: Take 1 capsule (10 mg total) by mouth at bedtime as needed for sleep.    Dispense:  30 capsule    Refill:  2     Follow-up: Return in about 1 week (around 04/10/2020).  Claretta Fraise, M.D.

## 2020-04-04 ENCOUNTER — Ambulatory Visit: Payer: Medicare Other | Admitting: Family Medicine

## 2020-04-04 LAB — CMP14+EGFR
ALT: 12 IU/L (ref 0–44)
AST: 20 IU/L (ref 0–40)
Albumin/Globulin Ratio: 1.6 (ref 1.2–2.2)
Albumin: 3.1 g/dL — ABNORMAL LOW (ref 3.7–4.7)
Alkaline Phosphatase: 65 IU/L (ref 44–121)
BUN/Creatinine Ratio: 19 (ref 10–24)
BUN: 30 mg/dL — ABNORMAL HIGH (ref 8–27)
Bilirubin Total: 0.6 mg/dL (ref 0.0–1.2)
CO2: 24 mmol/L (ref 20–29)
Calcium: 7.8 mg/dL — ABNORMAL LOW (ref 8.6–10.2)
Chloride: 99 mmol/L (ref 96–106)
Creatinine, Ser: 1.58 mg/dL — ABNORMAL HIGH (ref 0.76–1.27)
GFR calc Af Amer: 49 mL/min/{1.73_m2} — ABNORMAL LOW (ref >59)
GFR calc non Af Amer: 43 mL/min/{1.73_m2} — ABNORMAL LOW (ref >59)
Globulin, Total: 1.9 g/dL (ref 1.5–4.5)
Glucose: 201 mg/dL — ABNORMAL HIGH (ref 65–99)
Potassium: 4.6 mmol/L (ref 3.5–5.2)
Sodium: 138 mmol/L (ref 134–144)
Total Protein: 5 g/dL — ABNORMAL LOW (ref 6.0–8.5)

## 2020-04-04 LAB — CBC WITH DIFFERENTIAL/PLATELET
Basophils Absolute: 0 10*3/uL (ref 0.0–0.2)
Basos: 1 %
EOS (ABSOLUTE): 0.3 10*3/uL (ref 0.0–0.4)
Eos: 6 %
Hematocrit: 41.5 % (ref 37.5–51.0)
Hemoglobin: 13.3 g/dL (ref 13.0–17.7)
Immature Grans (Abs): 0.1 10*3/uL (ref 0.0–0.1)
Immature Granulocytes: 2 %
Lymphocytes Absolute: 1.1 10*3/uL (ref 0.7–3.1)
Lymphs: 23 %
MCH: 28.7 pg (ref 26.6–33.0)
MCHC: 32 g/dL (ref 31.5–35.7)
MCV: 90 fL (ref 79–97)
Monocytes Absolute: 0.5 10*3/uL (ref 0.1–0.9)
Monocytes: 10 %
Neutrophils Absolute: 2.8 10*3/uL (ref 1.4–7.0)
Neutrophils: 58 %
Platelets: 75 10*3/uL — CL (ref 150–450)
RBC: 4.63 x10E6/uL (ref 4.14–5.80)
RDW: 14.3 % (ref 11.6–15.4)
WBC: 4.7 10*3/uL (ref 3.4–10.8)

## 2020-04-04 LAB — LIPID PANEL
Chol/HDL Ratio: 5.2 ratio — ABNORMAL HIGH (ref 0.0–5.0)
Cholesterol, Total: 119 mg/dL (ref 100–199)
HDL: 23 mg/dL — ABNORMAL LOW (ref >39)
LDL Chol Calc (NIH): 61 mg/dL (ref 0–99)
Triglycerides: 211 mg/dL — ABNORMAL HIGH (ref 0–149)
VLDL Cholesterol Cal: 35 mg/dL (ref 5–40)

## 2020-04-06 ENCOUNTER — Encounter: Payer: Self-pay | Admitting: Family Medicine

## 2020-04-11 ENCOUNTER — Ambulatory Visit (INDEPENDENT_AMBULATORY_CARE_PROVIDER_SITE_OTHER): Payer: Medicare Other

## 2020-04-11 ENCOUNTER — Other Ambulatory Visit: Payer: Self-pay

## 2020-04-11 ENCOUNTER — Ambulatory Visit (INDEPENDENT_AMBULATORY_CARE_PROVIDER_SITE_OTHER): Payer: Medicare Other | Admitting: *Deleted

## 2020-04-11 ENCOUNTER — Ambulatory Visit (INDEPENDENT_AMBULATORY_CARE_PROVIDER_SITE_OTHER): Payer: Medicare Other | Admitting: Family Medicine

## 2020-04-11 ENCOUNTER — Encounter: Payer: Self-pay | Admitting: Family Medicine

## 2020-04-11 VITALS — BP 110/68 | HR 98 | Temp 97.8°F | Resp 20

## 2020-04-11 DIAGNOSIS — U071 COVID-19: Secondary | ICD-10-CM

## 2020-04-11 DIAGNOSIS — E119 Type 2 diabetes mellitus without complications: Secondary | ICD-10-CM

## 2020-04-11 DIAGNOSIS — J1282 Pneumonia due to coronavirus disease 2019: Secondary | ICD-10-CM

## 2020-04-11 DIAGNOSIS — I4819 Other persistent atrial fibrillation: Secondary | ICD-10-CM

## 2020-04-11 DIAGNOSIS — Z794 Long term (current) use of insulin: Secondary | ICD-10-CM

## 2020-04-11 DIAGNOSIS — I639 Cerebral infarction, unspecified: Secondary | ICD-10-CM

## 2020-04-11 DIAGNOSIS — I6930 Unspecified sequelae of cerebral infarction: Secondary | ICD-10-CM

## 2020-04-11 DIAGNOSIS — I10 Essential (primary) hypertension: Secondary | ICD-10-CM

## 2020-04-11 MED ORDER — RIVAROXABAN 20 MG PO TABS: 20.0000 mg | ORAL_TABLET | Freq: Every day | ORAL | 2 refills | Status: DC

## 2020-04-11 MED ORDER — FUROSEMIDE 40 MG PO TABS: 40.0000 mg | ORAL_TABLET | Freq: Every day | ORAL | 1 refills | Status: DC

## 2020-04-11 MED ORDER — APIXABAN 5 MG PO TABS: 5.0000 mg | ORAL_TABLET | Freq: Two times a day (BID) | ORAL | 2 refills | Status: DC

## 2020-04-11 NOTE — Chronic Care Management (AMB) (Signed)
  Chronic Care Management   Note  03/27/2020 Name: Daniel Myers MRN: [id number] DOB: [birth year]  Incoming call from patient's daughter Cecille Rubin regarding [name] insomnia. He is taking the mirtazapine and he'll go to sleep for 2-3 hours but then he is up for the rest of the night. He is a little confused about what time it is and will ask about that several times during the night. He's trying to do daytime activities, like his exercises, and they have to remind him that it's the middle of the night. She says that he didn't sleep at all on Friday night. She's worried because she knows he needs to sleep well in order to heal and get better and it's also hard on her and her sister being up all night.   Telephone message with this information sent to Dr Livia Snellen.   Unable to follow-up with patient or family later in the date because he was sent to the ED. [facility] nursing staff to f/u regarding any med changes.    Follow up plan: Telephone follow-up with RN Care Manager on 04/11/20 Patient to f/u with Dr Livia Snellen as instructed at discharge  [name], BSN, RN-BC [facility] Direct Dial: [phone]     SIGNATURE

## 2020-04-11 NOTE — Progress Notes (Signed)
Subjective:  Patient ID: Daniel Myers, male    DOB: 1945/08/06  Age: 75 y.o. MRN: [id number]  CC: One week follow up   HPI ANTOLIN BELSITO presents for recheck after recent diagnosis of stroke.  He is taking physical therapy.  He finished occupational therapy.  He is getting stronger and better.  His left-sided weakness seems to be decreasing.  He is not short of breath and his cough is getting better.  Depression screen [facility] 2/9 04/11/2020 04/03/2020 03/20/2020  Decreased Interest 0 0 0  Down, Depressed, Hopeless 0 0 0  PHQ - 2 Score 0 0 0    History Wm has a past medical history of Acute respiratory failure with hypoxia ([place]) (02/12/2020), AKI (acute kidney injury) ([place]) (02/13/2020), Atrial fibrillation with RVR ([place]) (03/11/2020), Basal cell carcinoma (06/27/2013), Basal cell carcinoma (07/01/2014), Basal cell carcinoma (10/10/2014), Basal cell carcinoma (02/11/2015), Basal cell carcinoma (06/14/2016), Basal cell carcinoma (02/22/2017), Basal cell carcinoma (04/11/2018), Chronic kidney disease, History of renal transplant, Hyperlipidemia, Hypertension, NSTEMI (non-ST elevated myocardial infarction) ([place]) (02/13/2020), 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), 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), 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), Squamous cell carcinoma of skin (11/08/2011), Squamous cell carcinoma of skin (11/08/2011), Squamous cell carcinoma of skin (11/08/2011), 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), 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), 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), 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), 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), 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), 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), 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), Squamous cell carcinoma of skin (09/30/2015), Squamous cell carcinoma of skin (02/22/2017), Squamous cell carcinoma of skin (02/22/2017), Squamous cell  carcinoma of skin (04/11/2018), Squamous cell carcinoma of skin (04/11/2018), Squamous cell carcinoma of skin (04/11/2018), Squamous cell carcinoma of skin (04/11/2018), Squamous cell carcinoma of skin (04/11/2018), Squamous cell carcinoma of skin (09/28/2018), Squamous cell carcinoma of skin (09/28/2018), Squamous cell carcinoma of skin (09/28/2018), Squamous cell carcinoma of skin (03/21/2019), Squamous cell carcinoma of skin (03/21/2019), Squamous cell carcinoma of skin (03/21/2019), Type 2 diabetes mellitus ([place]), and Unspecified atrial fibrillation ([place]) (02/17/2020).   He has a past surgical history that includes Kidney transplant (Right).   His family history includes Clotting disorder in his mother; Diabetes in his sister; Hypertension in his sister.He reports that he has been smoking pipe. He has never used smokeless tobacco. He reports that he does not drink alcohol and does not use drugs.    ROS Review of Systems  Constitutional: Negative for fever.  Respiratory: Negative for shortness of breath.   Cardiovascular: Negative for chest pain.  Musculoskeletal: Positive for gait problem (He is still weak.  He is in a wheelchair today just because of the distance and fatigue because of the distance of walking from the car to the office to exam room etc.). Negative for arthralgias.  Skin: Negative for rash.    Objective:  BP 110/68   Pulse 98   Temp 97.8 F (36.6 C) (Temporal)   Resp 20   SpO2 95%   BP Readings from Last 3 Encounters:  04/11/20 110/68  03/27/20 (!) 145/89  03/20/20 128/75    Wt Readings from Last 3 Encounters:  03/27/20 209 lb 8 oz (95 kg)  03/06/20 210 lb (95.3 kg)  02/18/20 211 lb 10.3 oz (96 kg)     Physical Exam Vitals reviewed.  Constitutional:      Appearance: He is well-developed and well-nourished.  HENT:     Head: Normocephalic and  atraumatic.     Right Ear: Tympanic membrane and external ear normal. No decreased hearing noted.     Left Ear:  Tympanic membrane and external ear normal. No decreased hearing noted.     Mouth/Throat:     Pharynx: No oropharyngeal exudate or posterior oropharyngeal erythema.  Eyes:     Pupils: Pupils are equal, round, and reactive to light.  Cardiovascular:     Rate and Rhythm: Normal rate and regular rhythm.     Heart sounds: No murmur heard.   Pulmonary:     Effort: No respiratory distress.     Breath sounds: Normal breath sounds.  Abdominal:     General: Bowel sounds are normal.     Palpations: Abdomen is soft. There is no mass.     Tenderness: There is no abdominal tenderness.  Musculoskeletal:     Cervical back: Normal range of motion and neck supple.       Assessment & Plan:   Bay was seen today for one week follow up.  Diagnoses and all orders for this visit:  Persistent atrial fibrillation ([place]) -     DG Chest 2 View; Future  Insulin dependent type 2 diabetes mellitus ([place]) -     DG Chest 2 View; Future  Pneumonia due to COVID-19 virus -     DG Chest 2 View; Future  Other orders -     furosemide (LASIX) 40 MG tablet; Take 1 tablet (40 mg total) by mouth daily. -     apixaban (ELIQUIS) 5 MG TABS tablet; Take 1 tablet (5 mg total) by mouth 2 (two) times daily. -     rivaroxaban (XARELTO) 20 MG TABS tablet; Take 1 tablet (20 mg total) by mouth daily with supper.       I have discontinued Truddie Crumble L. Hernandes's azithromycin and cefPROZIL. I have also changed his furosemide. Additionally, I am having him start on rivaroxaban. Lastly, I am having him maintain his tacrolimus, sodium bicarbonate, mycophenolate, ReliOn Insulin Syringe, loratadine, fexofenadine-pseudoephedrine, fluticasone, insulin NPH Human, fluorouracil, blood glucose meter kit and supplies, Insulin Regular Human, zinc sulfate, ascorbic acid, albuterol, metoprolol tartrate, potassium chloride SA, atorvastatin, aspirin EC, zaleplon, predniSONE, and apixaban.  Allergies as of 04/11/2020      Reactions   Other  Rash   Use paper tape.    Tape Rash   Use paper tape.    Trazodone And Nefazodone Palpitations   tachycardia   Mirtazapine    imbalance   Elemental Sulfur Rash      Medication List       Accurate as of April 11, 2020  6:02 PM. If you have any questions, ask your nurse or doctor.        STOP taking these medications   azithromycin 250 MG tablet Commonly known as: Zithromax Z-Pak Stopped by: Claretta Fraise, MD   cefPROZIL 500 MG tablet Commonly known as: CEFZIL Stopped by: Claretta Fraise, MD     TAKE these medications   albuterol 108 (90 Base) MCG/ACT inhaler Commonly known as: VENTOLIN HFA Inhale 2 puffs into the lungs every 6 (six) hours as needed for wheezing or shortness of breath.   apixaban 5 MG Tabs tablet Commonly known as: ELIQUIS Take 1 tablet (5 mg total) by mouth 2 (two) times daily.   ascorbic acid 500 MG tablet Commonly known as: VITAMIN C Take 1 tablet (500 mg total) by mouth daily.   aspirin EC 81 MG tablet Take 81 mg by mouth daily. Swallow  whole.   atorvastatin 10 MG tablet Commonly known as: LIPITOR Take 1 tablet (10 mg total) by mouth daily.   blood glucose meter kit and supplies Dispense based on patient and insurance preference. Use up to four times daily as directed. (FOR ICD-10 E10.9, E11.9).   fexofenadine-pseudoephedrine 180-240 MG 24 hr tablet Commonly known as: ALLEGRA-D 24 Take 1 tablet by mouth every evening. For allergy and congestion   fluorouracil 5 % cream Commonly known as: EFUDEX Apply 1 application topically daily.   fluticasone 50 MCG/ACT nasal spray Commonly known as: FLONASE USE ONE SPRAY(S) IN EACH NOSTRIL ONCE DAILY   furosemide 40 MG tablet Commonly known as: LASIX Take 1 tablet (40 mg total) by mouth daily. What changed:   when to take this  additional instructions Changed by: Claretta Fraise, MD   insulin NPH Human 100 UNIT/ML injection Commonly known as: NOVOLIN N 30 units sq AC breakfast, 40 units sq ac  supper   Insulin Regular Human 100 UNIT/ML Sopn Inject 12 units before lunch and 7 units before supper.   loratadine 10 MG tablet Commonly known as: CLARITIN Take 10 mg by mouth daily as needed for allergies.   metoprolol tartrate 50 MG tablet Commonly known as: LOPRESSOR Take 1 tablet (50 mg total) by mouth 2 (two) times daily.   mycophenolate 250 MG capsule Commonly known as: CELLCEPT TAKE 2 CAPSULES BY MOUTH TWO TIMES DAILY.   potassium chloride SA 20 MEQ tablet Commonly known as: KLOR-CON Take 1 tablet (20 mEq total) by mouth daily. For potassium replacement/ supplement   predniSONE 5 MG tablet Commonly known as: DELTASONE Take 5 mg by mouth daily with breakfast. What changed: Another medication with the same name was removed. Continue taking this medication, and follow the directions you see here. Changed by: Claretta Fraise, MD   ReliOn Insulin Syringe 31G X 15/64" 0.5 ML Misc Generic drug: Insulin Syringe-Needle U-100 USE AS DIRECTED FOR INSULIN   rivaroxaban 20 MG Tabs tablet Commonly known as: Xarelto Take 1 tablet (20 mg total) by mouth daily with supper. Started by: Claretta Fraise, MD   sodium bicarbonate 650 MG tablet Take 650 mg by mouth 2 (two) times daily.   tacrolimus 0.5 MG capsule Commonly known as: PROGRAF TAKE 2 CAPSULES BY MOUTH TWO TIMES DAILY.   zaleplon 10 MG capsule Commonly known as: SONATA Take 1 capsule (10 mg total) by mouth at bedtime as needed for sleep.   zinc sulfate 220 (50 Zn) MG capsule Take 1 capsule (220 mg total) by mouth daily.        Follow-up: Return in about 6 weeks (around 05/23/2020).  Claretta Fraise, M.D.

## 2020-04-11 NOTE — Chronic Care Management (AMB) (Signed)
Chronic Care Management   CCM RN Visit Note  04/11/2020 Name: Daniel Myers MRN: [id number] DOB: [birth year]  Subjective: Daniel Myers is a 75 y.o. year old male who is a primary care patient of Stacks, [name], MD. The care management team was consulted for assistance with disease management and care coordination needs.    Engaged with patient's daughter, Daniel Myers, by telephone for follow up visit in response to provider referral for case management and/or care coordination services.   Consent to Services:  The patient was given information about Chronic Care Management services, agreed to services, and gave verbal consent prior to initiation of services.  Please see initial visit note for detailed documentation.   Patient agreed to services and verbal consent obtained.   Assessment: Review of patient past medical history, allergies, medications, health status, including review of consultants reports, laboratory and other test data, was performed as part of comprehensive evaluation and provision of chronic care management services.   SDOH (Social Determinants of Health) assessments and interventions performed:    CCM Care Plan  Allergies  Allergen Reactions  . Other Rash    Use paper tape.   . Tape Rash    Use paper tape.   . Trazodone And Nefazodone Palpitations    tachycardia  . Mirtazapine     imbalance  . Elemental Sulfur Rash    Outpatient Encounter Medications as of 04/11/2020  Medication Sig Note  . albuterol (VENTOLIN HFA) 108 (90 Base) MCG/ACT inhaler Inhale 2 puffs into the lungs every 6 (six) hours as needed for wheezing or shortness of breath.   Marland Kitchen apixaban (ELIQUIS) 5 MG TABS tablet Take 1 tablet (5 mg total) by mouth 2 (two) times daily.   Marland Kitchen ascorbic acid (VITAMIN C) 500 MG tablet Take 1 tablet (500 mg total) by mouth daily.   Marland Kitchen aspirin EC 81 MG tablet Take 81 mg by mouth daily. Swallow whole.   Marland Kitchen atorvastatin (LIPITOR) 10 MG tablet Take 1 tablet (10 mg total) by mouth  daily.   Marland Kitchen azithromycin (ZITHROMAX Z-PAK) 250 MG tablet Take two right away Then one a day for the next 4 days.   . blood glucose meter kit and supplies Dispense based on patient and insurance preference. Use up to four times daily as directed. (FOR ICD-10 E10.9, E11.9).   . cefPROZIL (CEFZIL) 500 MG tablet Take 1 tablet (500 mg total) by mouth 2 (two) times daily. For infection. Take all of this medication.   . fexofenadine-pseudoephedrine (ALLEGRA-D 24) 180-240 MG 24 hr tablet Take 1 tablet by mouth every evening. For allergy and congestion   . fluorouracil (EFUDEX) 5 % cream Apply 1 application topically daily.   . fluticasone (FLONASE) 50 MCG/ACT nasal spray USE ONE SPRAY(S) IN EACH NOSTRIL ONCE DAILY   . furosemide (LASIX) 40 MG tablet Take 1 tablet (40 mg total) by mouth 2 (two) times daily. One at breakfast, the other 4-6 hours later.   . insulin NPH Human (NOVOLIN N) 100 UNIT/ML injection 30 units sq AC breakfast, 40 units sq ac supper 03/12/2020: 03/12/2020 Per daughter, Daniel Myers, taking Relion N 27 units in the morning and 27 units qhs  . Insulin Regular Human 100 UNIT/ML SOPN Inject 12 units before lunch and 7 units before supper. 03/12/2020: 03/12/2020 Per daughter, Daniel Myers, taking Relion R 20 units TID with meals (when meals are eaten)  . loratadine (CLARITIN) 10 MG tablet Take 10 mg by mouth daily as needed for allergies.   . metoprolol tartrate (  LOPRESSOR) 50 MG tablet Take 1 tablet (50 mg total) by mouth 2 (two) times daily.   . mycophenolate (CELLCEPT) 250 MG capsule TAKE 2 CAPSULES BY MOUTH TWO TIMES DAILY.   Marland Kitchen potassium chloride SA (KLOR-CON) 20 MEQ tablet Take 1 tablet (20 mEq total) by mouth daily. For potassium replacement/ supplement   . predniSONE (DELTASONE) 10 MG tablet Take 5 daily for 3 days followed by 4,3,2 and 1 for 3 days each.   Daniel Myers INSULIN SYRINGE 31G X 15/64" 0.5 ML MISC USE AS DIRECTED FOR INSULIN   . sodium bicarbonate 650 MG tablet Take 650 mg by mouth 2 (two) times daily.    . tacrolimus (PROGRAF) 0.5 MG capsule TAKE 2 CAPSULES BY MOUTH TWO TIMES DAILY.   . zaleplon (SONATA) 10 MG capsule Take 1 capsule (10 mg total) by mouth at bedtime as needed for sleep.   Marland Kitchen zinc sulfate 220 (50 Zn) MG capsule Take 1 capsule (220 mg total) by mouth daily.    No facility-administered encounter medications on file as of 04/11/2020.    Patient Active Problem List   Diagnosis Date Noted  . Persistent atrial fibrillation ([place]) 03/11/2020  . Systolic and diastolic CHF, acute ([place]) 02/13/2020  . Thrombocytopenia ([place]) 02/13/2020  . Pneumonia due to COVID-19 virus 02/12/2020  . Hyperglycemia due to diabetes mellitus ([place]) 02/12/2020  . CKD (chronic kidney disease), stage III ([place]) 02/12/2020  . Essential hypertension 11/22/2018  . Insulin dependent type 2 diabetes mellitus ([place]) 11/22/2018  . 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 Mixed hyperlipidemia 11/22/2018  . Vision decreased 11/22/2018  . Hyponatremia 11/22/2018  . Seasonal allergic rhinitis due to pollen 11/22/2018    Conditions to be addressed/monitored: HTN, DM, HLD, CKD, Afib, hx of MI, recent covid infection with pneumonia, recent CVA   Care Plan : RNCM: General Plan of Care (Adult)  Updates made by Daniel China, RN since 04/11/2020 12:00 AM    Problem: Quality of Life (General Plan of Care)     Goal: Quality of Life Improved   Start Date: 03/12/2020  This Visit's Progress: On track  Recent Progress: On track  Priority: High  Note:   Current Barriers:  . Care Coordination needs related to improving quality of life and independence in a patient with HTN, HLD, CKD, DM, AFib, hx of MI, recent covid infection with pneumonia, and new onset left sided weakness . Unable to perform IADLs independently  Nurse Case Manager Clinical Goal(s):  . patient will work with Consulting civil engineer to address needs related to left sided weakness and decreased ability to care for himself s/p CVA and covid infection . Patient  will work with home health physical therapy and nursing to improve strength, mobility, and ability to perform ADLs independently.   Interventions:  . 1:1 collaboration with Daniel Fraise, MD regarding development and update of comprehensive plan of care as evidenced by provider attestation and co-signature . Inter-disciplinary care team collaboration (see longitudinal plan of care) . Chart reviewed including relevant office notes, referral notes, lab results, orders, and imaging reports . Reviewed mobility and ability to perform ADLs. o Patient uses a standard walker in the home and has a wheelchair for appointments o Ramp at the front door o Able to feed himself but food has to be prepared for him o Able to dress, bath, and go to the bathroom with assistance o Occupational therapy sessions have ended. He is able to perform all of the activities they were working with  on . Discussed home health services with daughter, Daniel Myers o Patient is receiving nursing services for wound care and general nursing needs - Wounds are legs are looking much better. They are cleaning with NS, applying antibiotic ointment, and covering with tegarderm o Receiving PT services for strength, balance, and gait training . Reviewed upcoming appointment: today with Dr Daniel Myers  . Previously discussed family/social support o Two daughters and one son o Lives alone but Daniel Myers has been staying with him overnight for the past week since his fall o Daniel Myers also provides most of his support and care during the day. She has some help from [place] during the week and on the weekends. Clay isn't able to provide much assistance due to his own medical conditions but does visit often.  o Family is able to provide transportation, buy groceries, provide meals, help with ADLs . Discussed diet  o Readdressed appetite. Daughter states that appetite is normal now. o Reinforced diabetic diet for blood sugar management - Proper nutrition with  protein, vegetables, and complex carbs will provide energy and will help to improve strength and healing . Medications reviewed and discussed . Encouraged to talk with LCSW regarding psychosocial needs . Provided with RNCM contact number and encouraged to reach out as needed . Encouraged to reach out to PCP with any new or worsening symptoms  Patient Goals/Self-Care Activities Over the next 30 days, patient will: . Work with home health nursing and physical therapy to improve strength, balance, and overall health . Continue to use walker for ambulation . Work with Consulting civil engineer to discuss ways to self-manage medical conditions [phone] . Work with LCSW to discuss coping mechanisms and stress management [phone] . Keep appointment with PCP on 04/11/20 . Reach out to PCP with any new or worsening problems [phone] . Eat meals at regular intervals in order to maintain blood sugar levels and strength    Care Plan : RNCM: Diabetes Type 2 (Adult)  Updates made by Daniel China, RN since 04/11/2020 12:00 AM    Problem: Glycemic Management (Diabetes, Type 2)   Priority: Medium    Long-Range Goal: Glycemic Management Optimized   Start Date: 03/12/2020  This Visit's Progress: On track  Recent Progress: Not on track  Priority: Medium  Note:   Current Barriers:  . Chronic Disease Management support and education needs related to diabetes in a patient with HTN, HLD, CKD, Afib, hx of MI . Unable to perform IADLs independently  Nurse Case Manager Clinical Goal(s):  . patient will work with Consulting civil engineer to address needs related to self management of diabetes . patient will demonstrate improved adherence to prescribed treatment plan for diabetes as evidenced by daily blood pressure readings within recommended range  Interventions:  . 1:1 collaboration with Daniel Fraise, MD regarding development and update of comprehensive plan of care as evidenced by provider attestation and  co-signature . Inter-disciplinary care team collaboration (see longitudinal plan of care) . Evaluation of current treatment plan related to daibetes and patient's adherence to plan as established by provider. . Chart reviewed including relevant office notes and lab results . Reviewed and discussed medications o Compliant with medications . Discussed home blood sugar monitoring o Per [name], blood sugar readings have improved and they aren't having to test as frequently now o Testing 3 to 4 times a day o Typically 100-150 fasting o 200-300 during the day o No episodes below 70 over the past two weeks . Continue to check and record  blood sugar 3 to 4 times a day and call PCP with any readings outside of recommended range . Discussed diet o Previously poor appetite due to altered sense of taste but appetite has returned to normal now o Reinforced diabetic diet and recommended to provide meals at regular intervals  . Reviewed upcoming appointments . Encouraged to reach out to [facility] as needed  Patient Goals/Self-Care Activities Over the next 30 days, patient will: . check blood sugar at prescribed times . check blood sugar if I feel it is too high or too low . enter blood sugar readings and medication or insulin into daily log . take the blood sugar log to all doctor visits  . Call PCP [phone] with any readings outside of recommended range . Eat meals at regular intervals . Follow a diabetic diet for blood sugar control . Reach out to [place] with any questions [phone]    Follow Up Plan:  . Telephone follow up appointment with care management team member scheduled for:  05/06/20 with RN Care Manager, 04/15/20 with LCSW . The patient has been provided with contact information for the care management team and has been advised to call with any health related questions or concerns.  . Next PCP appointment scheduled for: 04/11/20 with Dr Daniel Myers . Myocardial perfusion  test 05/02/20 . Cardiology appointment 05/05/20  [name], BSN, RN-BC Embedded Chronic Care Manager [facility] [place] [facility] Direct Dial: [phone]

## 2020-04-11 NOTE — Patient Instructions (Signed)
Visit Information  PATIENT GOALS: Goals Addressed            This Visit's Progress   . Improve My Quality of Life   On track    Timeframe:  Short-Term Goal Priority:  High Start Date: 03/12/20                            Expected End Date: 09/09/20                      Follow Up Date 05/06/20   . ork with home health nursing and physical therapy to improve strength, balance, and overall health . Continue to use walker for ambulation . Work with Consulting civil engineer to discuss ways to self-manage medical conditions [phone] . Work with LCSW to discuss coping mechanisms and stress management [phone] . Keep appointment with PCP on 04/11/20 . Reach out to PCP with any new or worsening problems [phone] . Eat meals at regular intervals in order to maintain blood sugar levels and strength   Why is this important?    Having a long-term illness can be scary.   It can also be stressful for you and your caregiver.   These steps may help.    Notes:     Marland Kitchen Monitor and Manage My Blood Sugar-Diabetes Type 2   On track    Timeframe:  Long-Range Goal Priority:  Medium Start Date:  2/2/222                           Expected End Date: 09/09/20                      Follow Up Date 05/06/20   . check blood sugar at prescribed times . check blood sugar if I feel it is too high or too low . enter blood sugar readings and medication or insulin into daily log . take the blood sugar log to all doctor visits  . Call PCP [phone] with any readings outside of recommended range . Eat meals at regular intervals . Follow a diabetic diet for blood sugar control . Reach out to [place] with any questions [phone]   Why is this important?    Checking your blood sugar at home helps to keep it from getting very high or very low.   Writing the results in a diary or log helps the doctor know how to care for you.   Your blood sugar log should have the time, date and the results.    Also, write down the amount of insulin or other medicine that you take.   Other information, like what you ate, exercise done and how you were feeling, will also be helpful.     Notes:        Patient verbalizes understanding of instructions provided today and agrees to view in [place].   Follow Up Plan:  . Telephone follow up appointment with care management team member scheduled for:  05/06/20 with RN Care Manager, 04/15/20 with LCSW . The patient has been provided with contact information for the care management team and has been advised to call with any health related questions or concerns.  . Next PCP appointment scheduled for: 04/11/20 with Dr Livia Snellen . Myocardial perfusion test 05/02/20 . Cardiology appointment 05/05/20  [name], BSN, RN-BC Embedded Chronic Care Manager [place] /  [place] [facility] Direct Dial: [phone]

## 2020-04-14 ENCOUNTER — Ambulatory Visit (INDEPENDENT_AMBULATORY_CARE_PROVIDER_SITE_OTHER): Payer: Medicare Other

## 2020-04-14 ENCOUNTER — Telehealth: Payer: Self-pay | Admitting: *Deleted

## 2020-04-14 ENCOUNTER — Other Ambulatory Visit: Payer: Self-pay | Admitting: Family Medicine

## 2020-04-14 DIAGNOSIS — Z0000 Encounter for general adult medical examination without abnormal findings: Secondary | ICD-10-CM | POA: Diagnosis not present

## 2020-04-14 MED ORDER — ONDANSETRON HCL 4 MG PO TABS: 4.0000 mg | ORAL_TABLET | Freq: Three times a day (TID) | ORAL | 0 refills | Status: DC | PRN

## 2020-04-14 NOTE — Telephone Encounter (Signed)
I sent him to ondansetron.  It should help.  If he is not better in the next 48 hours he needs to be seen.

## 2020-04-14 NOTE — Telephone Encounter (Signed)
TC from [place] w/ [facility] She received call from pt's daugher this morning Pt c/o N & V x 1 wk, crackers and ginger ale not helping Can something be called into [facility] [place] Please advise and call daugher back

## 2020-04-14 NOTE — Progress Notes (Signed)
MEDICARE ANNUAL WELLNESS VISIT  04/14/2020  Telephone Visit Disclaimer This Medicare AWV was conducted by telephone due to national recommendations for restrictions regarding the COVID-19 Pandemic (e.g. social distancing).  I verified, using two identifiers, that I am speaking with Daniel Myers or their authorized healthcare agent. I discussed the limitations, risks, security, and privacy concerns of performing an evaluation and management service by telephone and the potential availability of an in-person appointment in the future. The patient expressed understanding and agreed to proceed.  Location of Patient: Home Location of Provider (nurse):  [facility]  Subjective:    Daniel Myers is a 75 y.o. male patient of Stacks, [name], MD who had a Medicare Annual Wellness Visit today via telephone. Daniel Myers is Retired and lives alone. he has three children and three grandchildren. he reports that he is socially active and does interact with friends/family regularly. he is minimally physically active and enjoys gardening.  Patient Care Team: Claretta Fraise, MD as PCP - General (Family Medicine) Satira Sark, MD as PCP - Cardiology (Cardiology) Lavonna Monarch, MD as Consulting Physician (Dermatology) Ilean China, RN as Case Manager Shea Evans [name], LCSW as [place] Management (Licensed Clinical Social Worker)  Advanced Directives 04/14/2020 03/27/2020 03/27/2020 02/12/2020 02/11/2020  Does Patient Have a Medical Advance Directive? _0   Would patient like information on creating a medical advance directive? No - Patient declined No - Patient declined No - Patient declined No - Patient declined Yes (ED - Information included in AVS)    Hospital Utilization Over the Past 12 Months: # of hospitalizations or ER visits: 1 # of surgeries: 0  Review of Systems    Patient reports that his overall health is worse compared to last year.  History obtained from  chart review and the patient  Patient Reported Readings (BP, Pulse, CBG, Weight, etc) none  Pain Assessment Pain : No/denies pain Pain Score: 0-No pain     Current Medications & Allergies (verified) Allergies as of 04/14/2020      Reactions   Other Rash   Use paper tape.    Tape Rash   Use paper tape.    Trazodone And Nefazodone Palpitations   tachycardia   Mirtazapine    imbalance   Elemental Sulfur Rash      Medication List       Accurate as of April 14, 2020  9:05 AM. If you have any questions, ask your nurse or doctor.        albuterol 108 (90 Base) MCG/ACT inhaler Commonly known as: VENTOLIN HFA Inhale 2 puffs into the lungs every 6 (six) hours as needed for wheezing or shortness of breath.   apixaban 5 MG Tabs tablet Commonly known as: ELIQUIS Take 1 tablet (5 mg total) by mouth 2 (two) times daily.   ascorbic acid 500 MG tablet Commonly known as: VITAMIN C Take 1 tablet (500 mg total) by mouth daily.   aspirin EC 81 MG tablet Take 81 mg by mouth daily. Swallow whole.   atorvastatin 10 MG tablet Commonly known as: LIPITOR Take 1 tablet (10 mg total) by mouth daily.   blood glucose meter kit and supplies Dispense based on patient and insurance preference. Use up to four times daily as directed. (FOR ICD-10 E10.9, E11.9).   fexofenadine-pseudoephedrine 180-240 MG 24 hr tablet Commonly known as: ALLEGRA-D 24 Take 1 tablet by mouth every evening. For allergy and congestion   fluorouracil 5 %  cream Commonly known as: EFUDEX Apply 1 application topically daily.   fluticasone 50 MCG/ACT nasal spray Commonly known as: FLONASE USE ONE SPRAY(S) IN EACH NOSTRIL ONCE DAILY   furosemide 40 MG tablet Commonly known as: LASIX Take 1 tablet (40 mg total) by mouth daily.   insulin NPH Human 100 UNIT/ML injection Commonly known as: NOVOLIN N 30 units sq AC breakfast, 40 units sq ac supper   Insulin Regular Human 100 UNIT/ML Sopn Inject 12 units before lunch  and 7 units before supper.   loratadine 10 MG tablet Commonly known as: CLARITIN Take 10 mg by mouth daily as needed for allergies.   metoprolol tartrate 50 MG tablet Commonly known as: LOPRESSOR Take 1 tablet (50 mg total) by mouth 2 (two) times daily.   mycophenolate 250 MG capsule Commonly known as: CELLCEPT TAKE 2 CAPSULES BY MOUTH TWO TIMES DAILY.   potassium chloride SA 20 MEQ tablet Commonly known as: KLOR-CON Take 1 tablet (20 mEq total) by mouth daily. For potassium replacement/ supplement   predniSONE 5 MG tablet Commonly known as: DELTASONE Take 5 mg by mouth daily with breakfast.   ReliOn Insulin Syringe 31G X 15/64" 0.5 ML Misc Generic drug: Insulin Syringe-Needle U-100 USE AS DIRECTED FOR INSULIN   rivaroxaban 20 MG Tabs tablet Commonly known as: Xarelto Take 1 tablet (20 mg total) by mouth daily with supper.   sodium bicarbonate 650 MG tablet Take 650 mg by mouth 2 (two) times daily.   tacrolimus 0.5 MG capsule Commonly known as: PROGRAF TAKE 2 CAPSULES BY MOUTH TWO TIMES DAILY.   zaleplon 10 MG capsule Commonly known as: SONATA Take 1 capsule (10 mg total) by mouth at bedtime as needed for sleep.   zinc sulfate 220 (50 Zn) MG capsule Take 1 capsule (220 mg total) by mouth daily.       History (reviewed): Past Medical History:  Diagnosis Date  . Acute respiratory failure with hypoxia (HCC) 02/12/2020  . AKI (acute kidney injury) (HCC) 02/13/2020  . Atrial fibrillation with RVR (HCC) 03/11/2020  . Basal cell carcinoma 06/27/2013   nodular on left jawline - excision  . Basal cell carcinoma 07/01/2014   nodular on left hawling - CX3+5FU+excision  . Basal cell carcinoma 10/10/2014   nodular on right forearm, middle - tx p bx  . Basal cell carcinoma 02/11/2015   left neck - CX3 + excision  . Basal cell carcinoma 06/14/2016   left jawline - CX3+5FU  . Basal cell carcinoma 02/22/2017   superficial and nodular on left neck - excision  . Basal cell  carcinoma 04/11/2018   superficial and nodular on right inferior forearm - CX3+Cautery+5FU  . Chronic kidney disease   . History of renal transplant   . Hyperlipidemia   . Hypertension   . NSTEMI (non-ST elevated myocardial infarction) (HCC) 02/13/2020  . 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 Squamous cell carcinoma of skin 11/08/2011   right upper forearm - tx p bx  . Squamous cell carcinoma of skin 11/08/2011   left upper forearm - tx p bx  . Squamous cell carcinoma of skin 11/08/2011   left hand - tx p bx  . 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/27/2013   in situ on  left lower back - CX3+5FU  . 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/27/2013   in situ on left forehead - CX3+5FU  . 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/27/2013   in situ on right temple - watch per ST  . 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/27/2013   in situ on right forearm - CX3+5FU  . Squamous cell carcinoma of skin 07/01/2014   well differentiated on right sideburn - CX3+5FU  . Squamous cell carcinoma of skin 07/01/2014   in situ on left shoulder - CX3+5FU  . Squamous cell carcinoma of skin 07/01/2014   in situ on right forearm, proximal - CX3+5FU+Cautery  . Squamous cell carcinoma of skin 07/01/2014   in situ on right forearm, distal - CX3+5FU  . Squamous cell carcinoma of skin 09/30/2015   in situ on posterior left ear - CX3+5FU  . Squamous cell carcinoma of skin 02/22/2017   in situ on right upper arm - CX3+5FU  . Squamous cell carcinoma of skin 02/22/2017   in situ on left upper arm - CX3+5FU  . Squamous cell carcinoma of skin 04/11/2018   in situ on right temple - CX3+5FU  . Squamous cell carcinoma of skin 04/11/2018   in situ on lateral right arm - MOHs  . Squamous cell carcinoma of skin 04/11/2018   in  situ on right upper arm - MOHs  . Squamous cell carcinoma of skin 04/11/2018   in situ on left sideburn  . Squamous cell carcinoma of skin 04/11/2018   in situ on right flank - tx p bx  . Squamous cell carcinoma of skin 09/28/2018   in situ on right inner ear - tx p bx  . Squamous cell carcinoma of skin 09/28/2018   in situ on left inner ear - tx p bx  . Squamous cell carcinoma of skin 09/28/2018   in situ on right arm - tx p bx  . Squamous cell carcinoma of skin 03/21/2019   in situ on right antihelix ([place] treated topically)  . Squamous cell carcinoma of skin 03/21/2019   in situ on right neck - CX3+5FU  . Squamous cell carcinoma of skin 03/21/2019   in situ on left outer eye, inf (MOHs done 05/23/2019)  . Type 2 diabetes mellitus ([place])   . Unspecified atrial fibrillation ([place]) 02/17/2020   Past Surgical History:  Procedure Laterality Date  . KIDNEY TRANSPLANT Right    Family History  Problem Relation Age of Onset  . Clotting disorder Mother   . Hypertension Sister   . Diabetes Sister    Social History   Socioeconomic History  . Marital status: Married    Spouse name: Not on file  . Number of children: Not on file  . Years of education: Not on file  . Highest education level: Not on file  Occupational History  . Not on file  Tobacco Use  . Smoking status: Former Smoker    Types: Pipe  . Smokeless tobacco: Never Used  Vaping Use  . Vaping Use: Never used  Substance and Sexual Activity  . Alcohol use: Never  . Drug use: Never  . Sexual activity: Not on file  Other Topics Concern  . Not on file  Social History Narrative  . Not on file   Social Determinants of Health   Financial Resource Strain: Low Risk   . Difficulty of Paying Living Expenses: Not hard at all  Food Insecurity: No Food Insecurity  . Worried About [employer] of  Food in the Last Year: Never true  . Ran Out of Food in the Last Year: Never true  Transportation Needs: No Transportation Needs   . Lack of Transportation (Medical): No  . Lack of Transportation (Non-Medical): No  Physical Activity: Inactive  . Days of Exercise per Week: 0 days  . Minutes of Exercise per Session: 0 min  Stress: Not on file  Social Connections: Socially Isolated  . Frequency of Communication with Friends and Family: More than three times a week  . Frequency of Social Gatherings with Friends and Family: More than three times a week  . Attends Religious Services: Never  . Active Member of Clubs or Organizations: No  . Attends Archivist Meetings: Never  . Marital Status: Widowed    Activities of Daily Living In your present state of health, do you have any difficulty performing the following activities: 04/14/2020 03/27/2020  Hearing? N N  Vision? Y N  Comment since having a stroke feels his vision is not as clear -  Difficulty concentrating or making decisions? - N  Walking or climbing stairs? Y Y  Dressing or bathing? N N  Doing errands, shopping? N N  Preparing Food and eating ? N -  Using the Toilet? N -  In the past six months, have you accidently leaked urine? N -  Do you have problems with loss of bowel control? N -  Managing your Medications? N -  Managing your Finances? N -  Housekeeping or managing your Housekeeping? N -  Some recent data might be hidden    Patient Education/ Literacy How often do you need to have someone help you when you read instructions, pamphlets, or other written materials from your doctor or pharmacy?: 1 - Never What is the last grade level you completed in school?: 10th grade  Exercise Current Exercise Habits: The patient does not participate in regular exercise at present, Exercise limited by: neurologic condition(s)  Diet Patient reports consuming 3 meals a day and 2 snack(s) a day Patient reports that his primary diet is: Regular Patient reports that she does have regular access to food.   Depression Screen PHQ 2/9 Scores 04/14/2020  04/11/2020 04/03/2020 03/20/2020 03/11/2020 02/29/2020 01/02/2020  PHQ - 2 Score 0 0 0 0 0 0 0     Fall Risk Fall Risk  04/14/2020 04/11/2020 04/03/2020 03/20/2020 03/11/2020  Falls in the past year? _0 Number falls in past yr: _1 Injury with Fall? _2 Risk for fall due to : History of fall(s);Impaired balance/gait;Impaired mobility History of fall(s);Impaired balance/gait;Impaired mobility History of fall(s);Impaired balance/gait;Impaired mobility History of fall(s);Impaired balance/gait;Impaired mobility Impaired balance/gait;History of fall(s)  Follow up _3      Objective:  Daniel Myers seemed alert and oriented and he participated appropriately during our telephone visit.  Blood Pressure Weight BMI  BP Readings from Last 3 Encounters:  04/11/20 110/68  03/27/20 (!) 145/89  03/20/20 128/75   Wt Readings from Last 3 Encounters:  03/27/20 209 lb 8 oz (95 kg)  03/06/20 210 lb (95.3 kg)  02/18/20 211 lb 10.3 oz (96 kg)   BMI Readings from Last 1 Encounters:  03/27/20 30.06 kg/m    *Unable to obtain current vital signs, weight, and BMI due to telephone visit type  Hearing/Vision  . Cruze did not seem to have  difficulty with hearing/understanding during the telephone conversation . Reports that he has had a formal eye exam by an eye care professional within the past year . Reports that he has not had a formal hearing evaluation within the past year *Unable to fully assess hearing and vision during telephone visit type  Cognitive Function: 6CIT Screen 04/14/2020  What Year? 0 points  What month? 0 points  What time? 0 points  Count back from 20 0 points  Months in reverse 4 points  Repeat phrase 0 points  Total Score 4   (Normal:0-7, Significant for Dysfunction: >8)  Normal Cognitive Function Screening: Yes   Immunization & Health  Maintenance Record Immunization History  Administered Date(s) Administered  . Fluad Quad(high Dose 65+) 12/26/2013, 12/29/2016, 12/28/2017, 11/22/2018  . Influenza Split 11/16/2010  . Influenza, High Dose Seasonal PF 12/26/2013, 11/26/2014, 01/05/2016, 12/29/2016, 12/28/2017, 05/03/2019  . Influenza, Seasonal, Injecte, Preservative Fre 12/09/2012  . Influenza,inj,Quad PF,6+ Mos 11/16/2010  . Influenza-Unspecified 12/09/2012  . Pneumococcal Conjugate-13 01/29/2015  . Pneumococcal Polysaccharide-23 09/09/2007, 05/04/2016    Health Maintenance  Topic Date Due  . OPHTHALMOLOGY EXAM  Never done  . COVID-19 Vaccine (1) Never done  . COLONOSCOPY (Pts 45-45yr Insurance coverage will need to be confirmed)  Never done  . URINE MICROALBUMIN  03/13/2020  . INFLUENZA VACCINE  05/08/2020 (Originally 09/09/2019)  . TETANUS/TDAP  06/26/2020 (Originally 05/24/1964)  . FOOT EXAM  06/26/2020  . HEMOGLOBIN A1C  10/01/2020  . Hepatitis C Screening  Completed  . PNA vac Low Risk Adult  Completed  . HPV VACCINES  Aged Out       Assessment  This is a routine wellness examination for [employer]  Health Maintenance: Due or Overdue Health Maintenance Due  Topic Date Due  . OPHTHALMOLOGY EXAM  Never done  . COVID-19 Vaccine (1) Never done  . COLONOSCOPY (Pts 45-464yrInsurance coverage will need to be confirmed)  Never done  . URINE MICROALBUMIN  03/13/2020    ClSandi Carneoes not need a referral for Community Assistance: Care Management:   no Social Work:    no Prescription Assistance:  no Nutrition/Diabetes Education:  no   Plan:  Personalized Goals Goals Addressed            This Visit's Progress   . Patient Stated       04/14/2020 AWV Goal: Exercise for General Health   Patient will verbalize understanding of the benefits of increased physical activity:  Exercising regularly is important. It will improve your overall fitness, flexibility, and endurance.  Regular exercise  also will improve your overall health. It can help you control your weight, reduce stress, and improve your bone density.  Over the next year, patient will increase physical activity as tolerated with a goal of at least 150 minutes of moderate physical activity per week.   You can tell that you are exercising at a moderate intensity if your heart starts beating faster and you start breathing faster but can still hold a conversation.  Moderate-intensity exercise ideas include:  Walking 1 mile (1.6 km) in about 15 minutes  Biking  Hiking  Golfing  Dancing  Water aerobics  Patient will verbalize understanding of everyday activities that increase physical activity by providing examples like the following: ? Yard work, such as: ? Pushing a laConservation officer, nature Raking and bagging leaves ? Washing your car ? Pushing a stroller ? Shoveling snow ? Gardening ? Washing windows or floors  Patient will be  able to explain general safety guidelines for exercising:   Before you start a new exercise program, talk with your health care provider.  Do not exercise so much that you hurt yourself, feel dizzy, or get very short of breath.  Wear comfortable clothes and wear shoes with good support.  Drink plenty of water while you exercise to prevent dehydration or heat stroke.  Work out until your breathing and your heartbeat get faster.       Personalized Health Maintenance & Screening Recommendations  Td vaccine Colorectal cancer screening  Lung Cancer Screening Recommended: no (Low Dose CT Chest recommended if Age 62-80 years, 30 pack-year currently smoking OR have quit w/in past 15 years) Hepatitis C Screening recommended: no HIV Screening recommended: no  Advanced Directives: Written information was not prepared per patient's request.  Referrals & Orders No orders of the defined types were placed in this encounter.   Follow-up Plan . Follow-up with Claretta Fraise, MD as  planned . Schedule colonoscopy    I have personally reviewed and noted the following in the patient's chart:   . Medical and social history . Use of alcohol, tobacco or illicit drugs  . Current medications and supplements . Functional ability and status . Nutritional status . Physical activity . Advanced directives . List of other physicians . Hospitalizations, surgeries, and ER visits in previous 12 months . Vitals . Screenings to include cognitive, depression, and falls . Referrals and appointments  In addition, I have reviewed and discussed with Daniel Myers certain preventive protocols, quality metrics, and best practice recommendations. A written personalized care plan for preventive services as well as general preventive health recommendations is available and can be mailed to the patient at his request.      Burnadette Pop  04/14/2020

## 2020-04-14 NOTE — Telephone Encounter (Signed)
Patient aware and will call back if symptoms do not improve to schedule appointment.

## 2020-04-15 ENCOUNTER — Ambulatory Visit: Payer: Medicare Other | Admitting: Licensed Clinical Social Worker

## 2020-04-15 DIAGNOSIS — E782 Mixed hyperlipidemia: Secondary | ICD-10-CM

## 2020-04-15 DIAGNOSIS — I252 Old myocardial infarction: Secondary | ICD-10-CM | POA: Diagnosis not present

## 2020-04-15 DIAGNOSIS — E119 Type 2 diabetes mellitus without complications: Secondary | ICD-10-CM

## 2020-04-15 DIAGNOSIS — I4819 Other persistent atrial fibrillation: Secondary | ICD-10-CM | POA: Diagnosis not present

## 2020-04-15 DIAGNOSIS — Z794 Long term (current) use of insulin: Secondary | ICD-10-CM

## 2020-04-15 DIAGNOSIS — I10 Essential (primary) hypertension: Secondary | ICD-10-CM

## 2020-04-15 DIAGNOSIS — N183 Chronic kidney disease, stage 3 unspecified: Secondary | ICD-10-CM | POA: Diagnosis not present

## 2020-04-15 NOTE — Patient Instructions (Addendum)
Licensed Clinical Social Worker Visit Information  Goals we discussed today:  Goals Addressed              This Visit's Progress   .  Manage My Emotions (pt-stated)        Timeframe:  Short-Term Goal Priority:  Medium Start Date :04/15/2020                             Expected End Date:    07/16/2020                  Follow Up Date: LCSW will call Daniel Myers, daughter of client, on 05/16/2020    Manage My Emotions (Patient)     Why is this important?    When you are stressed, down or upset, your body reacts too.   For example, your blood pressure may get higher; you may have a headache or stomachache.   When your emotions get the best of you, your body's ability to fight off cold and flu gets weak.   These steps will help you manage your emotions.      Patient Goals/Self Care Activities:  Over the next 30 days, patient will:    Attend scheduled medical appointments, take medications as prescribed, and communicate with RNCM and LCSW as needed for support            Materials Provided: No  Follow Up Plan: LCSW to call Daniel Myers, daughter of client, on 05/16/2020  The patient/Daniel Myers, daughter of patient,  verbalized understanding of instructions provided today and declined a print copy of patient instruction materials.   [name].Daniel Myers MSW, LCSW Licensed Clinical Social Worker [facility] Care Management [phone]

## 2020-04-15 NOTE — Chronic Care Management (AMB) (Signed)
Chronic Care Management    Clinical Social Work Follow Up Note  04/15/2020 Name: Daniel Myers MRN: [id number] DOB: May 09, 1945  Daniel Myers is a 75 y.o. year old male who is a primary care patient of Stacks, [name], MD. The CCM team was consulted for assistance with [employer] .   Review of patient status, including review of consultants reports, other relevant assessments, and collaboration with appropriate care team members and the patient's provider was performed as part of comprehensive patient evaluation and provision of chronic care management services.    SDOH (Social Determinants of Health) assessments performed: No; risk for social isolation; risk for tobacco use; risk for depression; risk for physical inactivity  Outpatient Encounter Medications as of 04/15/2020  Medication Sig Note  . albuterol (VENTOLIN HFA) 108 (90 Base) MCG/ACT inhaler Inhale 2 puffs into the lungs every 6 (six) hours as needed for wheezing or shortness of breath.   Marland Kitchen apixaban (ELIQUIS) 5 MG TABS tablet Take 1 tablet (5 mg total) by mouth 2 (two) times daily.   Marland Kitchen ascorbic acid (VITAMIN C) 500 MG tablet Take 1 tablet (500 mg total) by mouth daily.   Marland Kitchen aspirin EC 81 MG tablet Take 81 mg by mouth daily. Swallow whole.   Marland Kitchen atorvastatin (LIPITOR) 10 MG tablet Take 1 tablet (10 mg total) by mouth daily.   . blood glucose meter kit and supplies Dispense based on patient and insurance preference. Use up to four times daily as directed. (FOR ICD-10 E10.9, E11.9).   . fexofenadine-pseudoephedrine (ALLEGRA-D 24) 180-240 MG 24 hr tablet Take 1 tablet by mouth every evening. For allergy and congestion   . fluorouracil (EFUDEX) 5 % cream Apply 1 application topically daily.   . fluticasone (FLONASE) 50 MCG/ACT nasal spray USE ONE SPRAY(S) IN EACH NOSTRIL ONCE DAILY   . furosemide (LASIX) 40 MG tablet Take 1 tablet (40 mg total) by mouth daily.   . insulin NPH Human (NOVOLIN N) 100 UNIT/ML injection 30 units sq AC  breakfast, 40 units sq ac supper 03/12/2020: 03/12/2020 Per daughter, [name], taking Relion N 27 units in the morning and 27 units qhs  . Insulin Regular Human 100 UNIT/ML SOPN Inject 12 units before lunch and 7 units before supper. 03/12/2020: 03/12/2020 Per daughter, [name], taking Relion R 20 units TID with meals (when meals are eaten)  . loratadine (CLARITIN) 10 MG tablet Take 10 mg by mouth daily as needed for allergies.   . metoprolol tartrate (LOPRESSOR) 50 MG tablet Take 1 tablet (50 mg total) by mouth 2 (two) times daily.   . mycophenolate (CELLCEPT) 250 MG capsule TAKE 2 CAPSULES BY MOUTH TWO TIMES DAILY.   Marland Kitchen ondansetron (ZOFRAN) 4 MG tablet Take 1 tablet (4 mg total) by mouth every 8 (eight) hours as needed for nausea or vomiting.   . potassium chloride SA (KLOR-CON) 20 MEQ tablet Take 1 tablet (20 mEq total) by mouth daily. For potassium replacement/ supplement   . predniSONE (DELTASONE) 5 MG tablet Take 5 mg by mouth daily with breakfast.   . RELION INSULIN SYRINGE 31G X 15/64" 0.5 ML MISC USE AS DIRECTED FOR INSULIN   . rivaroxaban (XARELTO) 20 MG TABS tablet Take 1 tablet (20 mg total) by mouth daily with supper.   . sodium bicarbonate 650 MG tablet Take 650 mg by mouth 2 (two) times daily.   . tacrolimus (PROGRAF) 0.5 MG capsule TAKE 2 CAPSULES BY MOUTH TWO TIMES DAILY.   . zaleplon (SONATA) 10 MG capsule  Take 1 capsule (10 mg total) by mouth at bedtime as needed for sleep.   Marland Kitchen zinc sulfate 220 (50 Zn) MG capsule Take 1 capsule (220 mg total) by mouth daily.    No facility-administered encounter medications on file as of 04/15/2020.    Goals Addressed              This Visit's Progress   .  Manage My Emotions (pt-stated)        Timeframe:  Short-Term Goal Priority:  Medium Start Date :04/15/2020                             Expected End Date:    07/16/2020                  GOAL:   Current Barriers:   Anxiety and stress issues  Functional challenges   Clinical Goal(s):  Over  next 30 days LCSW will communicate with client/daughters to discuss anxiety and stress management for client   Interventions:  Collaboration with Claretta Fraise, MD regarding development and update of comprehensive plan of care as evidenced by provider attestation and co-signature  Talked with [name], daughter of client, about client functional challenge  Talked with [name] about ambulation of client (client uses a walker to help him walk)  Talked with [name] about client upcoming appointment with neurologist on April 23, 2020  Talked with [name] about sleeping issues of client  Talked with [name] about appetite of client    Talked with [name] about CCM support  Encouraged [name] to talk with RNCM as needed regarding nursing support for client  Talked with [name] about vision of client  Talked with [name] about client slight weakness on left side (client left arm and left leg)   Patient Goals/Self Care Activities:  Over the next 30 days, patient will:    Attend scheduled medical appointments, take medications as prescribed, and communicate with RNCM and LCSW as needed for support          Follow Up Plan: LCSW to call [name], daughter of client, on 05/16/2020       [name].Forrest MSW, LCSW Licensed Clinical Social Worker [facility] Care Management [phone]

## 2020-04-17 ENCOUNTER — Encounter: Payer: Self-pay | Admitting: Family Medicine

## 2020-04-17 ENCOUNTER — Ambulatory Visit (INDEPENDENT_AMBULATORY_CARE_PROVIDER_SITE_OTHER): Payer: Medicare Other

## 2020-04-17 ENCOUNTER — Ambulatory Visit (INDEPENDENT_AMBULATORY_CARE_PROVIDER_SITE_OTHER): Payer: Medicare Other | Admitting: Family Medicine

## 2020-04-17 ENCOUNTER — Other Ambulatory Visit: Payer: Self-pay

## 2020-04-17 VITALS — BP 129/79 | HR 105 | Temp 97.2°F

## 2020-04-17 DIAGNOSIS — R0602 Shortness of breath: Secondary | ICD-10-CM

## 2020-04-17 DIAGNOSIS — R809 Proteinuria, unspecified: Secondary | ICD-10-CM | POA: Diagnosis not present

## 2020-04-17 DIAGNOSIS — I639 Cerebral infarction, unspecified: Secondary | ICD-10-CM | POA: Diagnosis not present

## 2020-04-17 DIAGNOSIS — R110 Nausea: Secondary | ICD-10-CM

## 2020-04-17 DIAGNOSIS — R350 Frequency of micturition: Secondary | ICD-10-CM | POA: Diagnosis not present

## 2020-04-17 LAB — URINALYSIS
Bilirubin, UA: NEGATIVE
Glucose, UA: NEGATIVE
Ketones, UA: NEGATIVE
Leukocytes,UA: NEGATIVE
Nitrite, UA: NEGATIVE
RBC, UA: NEGATIVE
Specific Gravity, UA: >1.03 — ABNORMAL HIGH (ref 1.005–1.030)
Urobilinogen, Ur: 0.2 mg/dL (ref 0.2–1.0)
pH, UA: 5 (ref 5.0–7.5)

## 2020-04-17 MED ORDER — ONDANSETRON HCL 4 MG PO TABS: 4.0000 mg | ORAL_TABLET | Freq: Three times a day (TID) | ORAL | 0 refills | Status: DC | PRN

## 2020-04-17 MED ORDER — LEVOFLOXACIN 500 MG PO TABS: 500.0000 mg | ORAL_TABLET | Freq: Every day | ORAL | 0 refills | Status: DC

## 2020-04-17 NOTE — Progress Notes (Signed)
Subjective:  Patient ID: Daniel Myers, male    DOB: 19-May-1945  Age: 75 y.o. MRN: [id number]  CC:  HPI [name] presents for decreased appetite, strength. Not sleeping as well. Bladder feels like he needs to go constantly. No going much., but having frequency. More anxious and shortness  Of breath has started to get worse again.    Depression screen [facility] 2/9 04/17/2020 04/14/2020 04/11/2020  Decreased Interest 0 0 0  Down, Depressed, Hopeless 0 0 0  PHQ - 2 Score 0 0 0   GAD 7 : Generalized Anxiety Score 04/17/2020  Nervous, Anxious, on Edge 3  Control/stop worrying 3  Worry too much - different things 3  Trouble relaxing 3  Restless 2  Easily annoyed or irritable 1  Afraid - awful might happen 0  Total GAD 7 Score 15  Anxiety Difficulty Somewhat difficult     History  He has a past surgical history that includes Kidney transplant (Right).   His family history includes Clotting disorder in his mother; Diabetes in his sister; Hypertension in his sister.He reports that he has quit smoking. His smoking use included pipe. He has never used smokeless tobacco. He reports that he does not drink alcohol and does not use drugs.    ROS Review of Systems  Constitutional: Positive for appetite change. Negative for fever.  Respiratory: Positive for cough and shortness of breath.   Cardiovascular: Negative for chest pain.  Musculoskeletal: Negative for arthralgias.  Skin: Negative for rash.    Objective:  BP 129/79   Pulse (!) 105   Temp (!) 97.2 F (36.2 C)   SpO2 96%   BP Readings from Last 3 Encounters:  04/17/20 129/79  04/11/20 110/68  03/27/20 (!) 145/89    Wt Readings from Last 3 Encounters:  03/27/20 209 lb 8 oz (95 kg)  03/06/20 210 lb (95.3 kg)  02/18/20 211 lb 10.3 oz (96 kg)     Physical Exam Vitals reviewed.  Constitutional:      Appearance: He is well-developed.  HENT:     Head: Normocephalic and atraumatic.     Right Ear: Tympanic membrane and  external ear normal. No decreased hearing noted.     Left Ear: Tympanic membrane and external ear normal. No decreased hearing noted.     Mouth/Throat:     Pharynx: No oropharyngeal exudate or posterior oropharyngeal erythema.  Eyes:     Pupils: Pupils are equal, round, and reactive to light.  Cardiovascular:     Rate and Rhythm: Normal rate and regular rhythm.     Heart sounds: No murmur heard.   Pulmonary:     Effort: No respiratory distress.     Breath sounds: Wheezing present. No rales.     Comments: Decreased breath sounds  Abdominal:     General: Bowel sounds are normal.     Palpations: Abdomen is soft. There is no mass.     Tenderness: There is no abdominal tenderness.  Musculoskeletal:     Cervical back: Normal range of motion and neck supple.     CXR - some persistent infiltrate  Assessment & Plan:   Praneel was seen today for fatigue and dysuria.  Diagnoses and all orders for this visit:  Nausea -     DG Abd 2 Views; Future -     CBC with Differential/Platelet  Shortness of breath -     DG Chest 2 View; Future -     CBC with Differential/Platelet  Urinary frequency -     Urinalysis -     Urine Culture -     CBC with Differential/Platelet  Proteinuria, unspecified type -     CMP14+EGFR -     CBC with Differential/Platelet  Other orders -     ondansetron (ZOFRAN) 4 MG tablet; Take 1 tablet (4 mg total) by mouth every 8 (eight) hours as needed for nausea or vomiting. -     levofloxacin (LEVAQUIN) 500 MG tablet; Take 1 tablet (500 mg total) by mouth daily. For 10 days       I am having [name] start on levofloxacin. I am also having him maintain his tacrolimus, sodium bicarbonate, mycophenolate, ReliOn Insulin Syringe, loratadine, fexofenadine-pseudoephedrine, fluticasone, insulin NPH Human, fluorouracil, blood glucose meter kit and supplies, Insulin Regular Human, zinc sulfate, ascorbic acid, albuterol, metoprolol tartrate, potassium chloride  SA, atorvastatin, aspirin EC, zaleplon, predniSONE, furosemide, apixaban, rivaroxaban, and ondansetron.  Allergies as of 04/17/2020      Reactions   Other Rash   Use paper tape.    Tape Rash   Use paper tape.    Trazodone And Nefazodone Palpitations   tachycardia   Mirtazapine    imbalance   Elemental Sulfur Rash      Medication List       Accurate as of April 17, 2020 11:59 PM. If you have any questions, ask your nurse or doctor.        albuterol 108 (90 Base) MCG/ACT inhaler Commonly known as: VENTOLIN HFA Inhale 2 puffs into the lungs every 6 (six) hours as needed for wheezing or shortness of breath.   apixaban 5 MG Tabs tablet Commonly known as: ELIQUIS Take 1 tablet (5 mg total) by mouth 2 (two) times daily.   ascorbic acid 500 MG tablet Commonly known as: VITAMIN C Take 1 tablet (500 mg total) by mouth daily.   aspirin EC 81 MG tablet Take 81 mg by mouth daily. Swallow whole.   atorvastatin 10 MG tablet Commonly known as: LIPITOR Take 1 tablet (10 mg total) by mouth daily.   blood glucose meter kit and supplies Dispense based on patient and insurance preference. Use up to four times daily as directed. (FOR ICD-10 E10.9, E11.9).   fexofenadine-pseudoephedrine 180-240 MG 24 hr tablet Commonly known as: ALLEGRA-D 24 Take 1 tablet by mouth every evening. For allergy and congestion   fluorouracil 5 % cream Commonly known as: EFUDEX Apply 1 application topically daily.   fluticasone 50 MCG/ACT nasal spray Commonly known as: FLONASE USE ONE SPRAY(S) IN EACH NOSTRIL ONCE DAILY   furosemide 40 MG tablet Commonly known as: LASIX Take 1 tablet (40 mg total) by mouth daily.   insulin NPH Human 100 UNIT/ML injection Commonly known as: NOVOLIN N 30 units sq AC breakfast, 40 units sq ac supper   Insulin Regular Human 100 UNIT/ML Sopn Inject 12 units before lunch and 7 units before supper.   levofloxacin 500 MG tablet Commonly known as: LEVAQUIN Take 1 tablet  (500 mg total) by mouth daily. For 10 days Started by: Claretta Fraise, MD   loratadine 10 MG tablet Commonly known as: CLARITIN Take 10 mg by mouth daily as needed for allergies.   metoprolol tartrate 50 MG tablet Commonly known as: LOPRESSOR Take 1 tablet (50 mg total) by mouth 2 (two) times daily.   mycophenolate 250 MG capsule Commonly known as: CELLCEPT TAKE 2 CAPSULES BY MOUTH TWO TIMES DAILY.   ondansetron 4 MG tablet Commonly known as:  ZOFRAN Take 1 tablet (4 mg total) by mouth every 8 (eight) hours as needed for nausea or vomiting.   potassium chloride SA 20 MEQ tablet Commonly known as: KLOR-CON Take 1 tablet (20 mEq total) by mouth daily. For potassium replacement/ supplement   predniSONE 5 MG tablet Commonly known as: DELTASONE Take 5 mg by mouth daily with breakfast.   ReliOn Insulin Syringe 31G X 15/64" 0.5 ML Misc Generic drug: Insulin Syringe-Needle U-100 USE AS DIRECTED FOR INSULIN   rivaroxaban 20 MG Tabs tablet Commonly known as: Xarelto Take 1 tablet (20 mg total) by mouth daily with supper.   sodium bicarbonate 650 MG tablet Take 650 mg by mouth 2 (two) times daily.   tacrolimus 0.5 MG capsule Commonly known as: PROGRAF TAKE 2 CAPSULES BY MOUTH TWO TIMES DAILY.   zaleplon 10 MG capsule Commonly known as: SONATA Take 1 capsule (10 mg total) by mouth at bedtime as needed for sleep.   zinc sulfate 220 (50 Zn) MG capsule Take 1 capsule (220 mg total) by mouth daily.        Follow-up: Return in about 1 week (around 04/24/2020).  Claretta Fraise, M.D.

## 2020-04-18 ENCOUNTER — Encounter: Payer: Self-pay | Admitting: Family Medicine

## 2020-04-18 LAB — CBC WITH DIFFERENTIAL/PLATELET
Basophils Absolute: 0 10*3/uL (ref 0.0–0.2)
Basos: 1 %
EOS (ABSOLUTE): 0 10*3/uL (ref 0.0–0.4)
Eos: 1 %
Hematocrit: 36.7 % — ABNORMAL LOW (ref 37.5–51.0)
Hemoglobin: 12.4 g/dL — ABNORMAL LOW (ref 13.0–17.7)
Immature Grans (Abs): 0.1 10*3/uL (ref 0.0–0.1)
Immature Granulocytes: 2 %
Lymphocytes Absolute: 1 10*3/uL (ref 0.7–3.1)
Lymphs: 27 %
MCH: 29.1 pg (ref 26.6–33.0)
MCHC: 33.8 g/dL (ref 31.5–35.7)
MCV: 86 fL (ref 79–97)
Monocytes Absolute: 0.3 10*3/uL (ref 0.1–0.9)
Monocytes: 8 %
Neutrophils Absolute: 2.4 10*3/uL (ref 1.4–7.0)
Neutrophils: 61 %
Platelets: 123 10*3/uL — ABNORMAL LOW (ref 150–450)
RBC: 4.26 x10E6/uL (ref 4.14–5.80)
RDW: 14.2 % (ref 11.6–15.4)
WBC: 3.9 10*3/uL (ref 3.4–10.8)

## 2020-04-18 LAB — CMP14+EGFR
ALT: 54 IU/L — ABNORMAL HIGH (ref 0–44)
AST: 44 IU/L — ABNORMAL HIGH (ref 0–40)
Albumin/Globulin Ratio: 1.7 (ref 1.2–2.2)
Albumin: 3.5 g/dL — ABNORMAL LOW (ref 3.7–4.7)
Alkaline Phosphatase: 121 IU/L (ref 44–121)
BUN/Creatinine Ratio: 24 (ref 10–24)
BUN: 49 mg/dL — ABNORMAL HIGH (ref 8–27)
Bilirubin Total: 0.8 mg/dL (ref 0.0–1.2)
CO2: 19 mmol/L — ABNORMAL LOW (ref 20–29)
Calcium: 8 mg/dL — ABNORMAL LOW (ref 8.6–10.2)
Chloride: 90 mmol/L — ABNORMAL LOW (ref 96–106)
Creatinine, Ser: 2.01 mg/dL — ABNORMAL HIGH (ref 0.76–1.27)
Globulin, Total: 2.1 g/dL (ref 1.5–4.5)
Glucose: 184 mg/dL — ABNORMAL HIGH (ref 65–99)
Potassium: 5.2 mmol/L (ref 3.5–5.2)
Sodium: 127 mmol/L — ABNORMAL LOW (ref 134–144)
Total Protein: 5.6 g/dL — ABNORMAL LOW (ref 6.0–8.5)
eGFR: 34 mL/min/{1.73_m2} — ABNORMAL LOW (ref >59)

## 2020-04-19 LAB — URINE CULTURE

## 2020-04-24 ENCOUNTER — Other Ambulatory Visit: Payer: Self-pay

## 2020-04-24 ENCOUNTER — Ambulatory Visit (INDEPENDENT_AMBULATORY_CARE_PROVIDER_SITE_OTHER): Payer: Medicare Other | Admitting: Family Medicine

## 2020-04-24 ENCOUNTER — Encounter: Payer: Self-pay | Admitting: Family Medicine

## 2020-04-24 VITALS — BP 111/72 | HR 99 | Temp 97.7°F

## 2020-04-24 DIAGNOSIS — E782 Mixed hyperlipidemia: Secondary | ICD-10-CM

## 2020-04-24 DIAGNOSIS — I6930 Unspecified sequelae of cerebral infarction: Secondary | ICD-10-CM | POA: Diagnosis not present

## 2020-04-24 DIAGNOSIS — J189 Pneumonia, unspecified organism: Secondary | ICD-10-CM | POA: Diagnosis not present

## 2020-04-24 DIAGNOSIS — E119 Type 2 diabetes mellitus without complications: Secondary | ICD-10-CM

## 2020-04-24 DIAGNOSIS — I4819 Other persistent atrial fibrillation: Secondary | ICD-10-CM

## 2020-04-24 DIAGNOSIS — Z794 Long term (current) use of insulin: Secondary | ICD-10-CM

## 2020-04-24 DIAGNOSIS — I10 Essential (primary) hypertension: Secondary | ICD-10-CM | POA: Diagnosis not present

## 2020-04-24 DIAGNOSIS — G4700 Insomnia, unspecified: Secondary | ICD-10-CM

## 2020-04-24 MED ORDER — ATORVASTATIN CALCIUM 10 MG PO TABS: 10.0000 mg | ORAL_TABLET | Freq: Every day | ORAL | 1 refills | Status: DC

## 2020-04-24 MED ORDER — LEVOFLOXACIN 500 MG PO TABS: 500.0000 mg | ORAL_TABLET | Freq: Every day | ORAL | 0 refills | Status: DC

## 2020-04-24 NOTE — Progress Notes (Signed)
Subjective:  Patient ID: Daniel Myers, male    DOB: 03/12/1945  Age: 75 y.o. MRN: [id number]  CC: Follow-up   HPI Daniel Myers presents for pneumonia f/u. Not coughing.  He has been having some swelling in the ankles but he denies shortness of breath and only scant cough this week.  He says his blood sugars are doing well they are staying in the low 100s without hypoglycemic spells.  He got very sick last week again and was put back on the levofloxacin.  He is continue to take that now.  His daughter is here today and tells me that within 2 days of starting on antibiotics he started feeling much better.  He was an active last week but is now getting up and moving around better at home.  He went to neurology Dr. Merlene Laughter yesterday.  He was taken off of the baby aspirin but kept on the Eliquis.  Additionally he was put on Xanax as needed for sleep at night.  Depression screen [facility] 2/9 04/24/2020 04/17/2020 04/14/2020  Decreased Interest 0 0 0  Down, Depressed, Hopeless 0 0 0  PHQ - 2 Score 0 0 0    History Daniel Myers has a past medical history of Acute respiratory failure with hypoxia ([place]) (02/12/2020), AKI (acute kidney injury) ([place]) (02/13/2020), Atrial fibrillation with RVR ([place]) (03/11/2020), Basal cell carcinoma (06/27/2013), Basal cell carcinoma (07/01/2014), Basal cell carcinoma (10/10/2014), Basal cell carcinoma (02/11/2015), Basal cell carcinoma (06/14/2016), Basal cell carcinoma (02/22/2017), Basal cell carcinoma (04/11/2018), Chronic kidney disease, History of renal transplant, Hyperlipidemia, Hypertension, NSTEMI (non-ST elevated myocardial infarction) ([place]) (02/13/2020), 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), 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), 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), Squamous cell carcinoma of skin (11/08/2011), Squamous cell carcinoma of skin (11/08/2011), Squamous cell carcinoma of skin (11/08/2011), 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), Squamous cell  carcinoma of skin (06/27/2013), 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), 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), 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), 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), 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), 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), Squamous cell carcinoma of skin (09/30/2015), Squamous cell carcinoma of skin (02/22/2017), Squamous cell carcinoma of skin (02/22/2017), Squamous cell carcinoma of skin (04/11/2018), Squamous cell carcinoma of skin (04/11/2018), Squamous cell carcinoma of skin (04/11/2018), Squamous cell carcinoma of skin (04/11/2018), Squamous cell carcinoma of skin (04/11/2018), Squamous cell carcinoma of skin (09/28/2018), Squamous cell carcinoma of skin (09/28/2018), Squamous cell carcinoma of skin (09/28/2018), Squamous cell carcinoma of skin (03/21/2019), Squamous cell carcinoma of skin (03/21/2019), Squamous cell carcinoma of skin (03/21/2019), Type 2 diabetes mellitus ([place]), and Unspecified atrial fibrillation ([place]) (02/17/2020).   He has a past surgical history that includes Kidney transplant (Right).   His family history includes Clotting disorder in his mother; Diabetes in his sister; Hypertension in his sister.He reports that he has quit smoking. His smoking use included pipe. He has never used smokeless tobacco. He reports that he does not drink alcohol and does not use drugs.    ROS Review of Systems  Constitutional: Positive for activity change and fatigue. Negative for fever.  HENT: Positive for congestion.   Respiratory: Positive for cough (much better). Negative for shortness of breath.   Cardiovascular: Positive for leg swelling. Negative for chest pain.  Skin: Negative for rash.  Psychiatric/Behavioral: Positive for sleep disturbance (improved).    Objective:  BP 111/72   Pulse 99   Temp 97.7 F (36.5 C)   SpO2 95%  BP Readings from Last 3 Encounters:  04/24/20 111/72  04/17/20 129/79   04/11/20 110/68    Wt Readings from Last 3 Encounters:  03/27/20 209 lb 8 oz (95 kg)  03/06/20 210 lb (95.3 kg)  02/18/20 211 lb 10.3 oz (96 kg)     Physical Exam Vitals reviewed.  Constitutional:      Appearance: He is well-developed.  HENT:     Head: Normocephalic and atraumatic.     Right Ear: Tympanic membrane and external ear normal. No decreased hearing noted.     Left Ear: Tympanic membrane and external ear normal. No decreased hearing noted.     Mouth/Throat:     Pharynx: No oropharyngeal exudate or posterior oropharyngeal erythema.  Eyes:     Pupils: Pupils are equal, round, and reactive to light.  Cardiovascular:     Rate and Rhythm: Normal rate and regular rhythm.     Heart sounds: No murmur heard.   Pulmonary:     Effort: No respiratory distress.     Breath sounds: Rales (left and right baseas) present.  Abdominal:     General: Bowel sounds are normal.     Palpations: Abdomen is soft. There is no mass.     Tenderness: There is no abdominal tenderness.  Musculoskeletal:     Cervical back: Normal range of motion and neck supple.       Assessment & Plan:   Daniel Myers was seen today for follow-up.  Diagnoses and all orders for this visit:  Pneumonia of both lower lobes due to infectious organism -     levofloxacin (LEVAQUIN) 500 MG tablet; Take 1 tablet (500 mg total) by mouth daily. For 10 days -     CT Chest Wo Contrast; Future  Essential hypertension  Mixed hyperlipidemia -     atorvastatin (LIPITOR) 10 MG tablet; Take 1 tablet (10 mg total) by mouth daily.  Persistent atrial fibrillation (HCC)  Insulin dependent type 2 diabetes mellitus ([place])  Insomnia, unspecified type       I am having Daniel Myers maintain his tacrolimus, sodium bicarbonate, mycophenolate, ReliOn Insulin Syringe, loratadine, fexofenadine-pseudoephedrine, fluticasone, insulin NPH Human, fluorouracil, blood glucose meter kit and supplies, Insulin Regular Human, zinc  sulfate, ascorbic acid, albuterol, metoprolol tartrate, potassium chloride SA, aspirin EC, zaleplon, predniSONE, furosemide, apixaban, rivaroxaban, ondansetron, ALPRAZolam, levofloxacin, and atorvastatin.  Allergies as of 04/24/2020      Reactions   Other Rash   Use paper tape.    Tape Rash   Use paper tape.    Trazodone And Nefazodone Palpitations   tachycardia   Mirtazapine    imbalance   Elemental Sulfur Rash      Medication List       Accurate as of April 24, 2020 11:25 AM. If you have any questions, ask your nurse or doctor.        albuterol 108 (90 Base) MCG/ACT inhaler Commonly known as: VENTOLIN HFA Inhale 2 puffs into the lungs every 6 (six) hours as needed for wheezing or shortness of breath.   ALPRAZolam 0.25 MG tablet Commonly known as: XANAX Take 0.5 mg by mouth at bedtime as needed for anxiety.   apixaban 5 MG Tabs tablet Commonly known as: ELIQUIS Take 1 tablet (5 mg total) by mouth 2 (two) times daily.   ascorbic acid 500 MG tablet Commonly known as: VITAMIN C Take 1 tablet (500 mg total) by mouth daily.   aspirin EC 81 MG tablet Take 81 mg by mouth daily.  Swallow whole.   atorvastatin 10 MG tablet Commonly known as: LIPITOR Take 1 tablet (10 mg total) by mouth daily.   blood glucose meter kit and supplies Dispense based on patient and insurance preference. Use up to four times daily as directed. (FOR ICD-10 E10.9, E11.9).   fexofenadine-pseudoephedrine 180-240 MG 24 hr tablet Commonly known as: ALLEGRA-D 24 Take 1 tablet by mouth every evening. For allergy and congestion   fluorouracil 5 % cream Commonly known as: EFUDEX Apply 1 application topically daily.   fluticasone 50 MCG/ACT nasal spray Commonly known as: FLONASE USE ONE SPRAY(S) IN EACH NOSTRIL ONCE DAILY   furosemide 40 MG tablet Commonly known as: LASIX Take 1 tablet (40 mg total) by mouth daily.   insulin NPH Human 100 UNIT/ML injection Commonly known as: NOVOLIN N 30 units  sq AC breakfast, 40 units sq ac supper   Insulin Regular Human 100 UNIT/ML Sopn Inject 12 units before lunch and 7 units before supper.   levofloxacin 500 MG tablet Commonly known as: LEVAQUIN Take 1 tablet (500 mg total) by mouth daily. For 10 days   loratadine 10 MG tablet Commonly known as: CLARITIN Take 10 mg by mouth daily as needed for allergies.   metoprolol tartrate 50 MG tablet Commonly known as: LOPRESSOR Take 1 tablet (50 mg total) by mouth 2 (two) times daily.   mycophenolate 250 MG capsule Commonly known as: CELLCEPT TAKE 2 CAPSULES BY MOUTH TWO TIMES DAILY.   ondansetron 4 MG tablet Commonly known as: ZOFRAN Take 1 tablet (4 mg total) by mouth every 8 (eight) hours as needed for nausea or vomiting.   potassium chloride SA 20 MEQ tablet Commonly known as: KLOR-CON Take 1 tablet (20 mEq total) by mouth daily. For potassium replacement/ supplement   predniSONE 5 MG tablet Commonly known as: DELTASONE Take 5 mg by mouth daily with breakfast.   ReliOn Insulin Syringe 31G X 15/64" 0.5 ML Misc Generic drug: Insulin Syringe-Needle U-100 USE AS DIRECTED FOR INSULIN   rivaroxaban 20 MG Tabs tablet Commonly known as: Xarelto Take 1 tablet (20 mg total) by mouth daily with supper.   sodium bicarbonate 650 MG tablet Take 650 mg by mouth 2 (two) times daily.   tacrolimus 0.5 MG capsule Commonly known as: PROGRAF TAKE 2 CAPSULES BY MOUTH TWO TIMES DAILY.   zaleplon 10 MG capsule Commonly known as: SONATA Take 1 capsule (10 mg total) by mouth at bedtime as needed for sleep.   zinc sulfate 220 (50 Zn) MG capsule Take 1 capsule (220 mg total) by mouth daily.        Follow-up: Return in about 1 week (around 05/01/2020).  Claretta Fraise, M.D.

## 2020-05-01 ENCOUNTER — Telehealth: Payer: Self-pay | Admitting: *Deleted

## 2020-05-01 NOTE — Telephone Encounter (Signed)
VM from [facility] w/ [facility] Pt has appt tomorrow w/ cardiology for stress test, wanting to know if he should keep the appt since his legs are being wrapped d/t weeping from the swelling he has been having

## 2020-05-01 NOTE — Telephone Encounter (Signed)
He should cancel and speak to cardiology about a chemical stress test.

## 2020-05-02 ENCOUNTER — Encounter (HOSPITAL_BASED_OUTPATIENT_CLINIC_OR_DEPARTMENT_OTHER)
Admission: RE | Admit: 2020-05-02 | Discharge: 2020-05-02 | Disposition: A | Discharge status: Home / Self Care | Payer: Medicare Other | Source: Ambulatory Visit | Attending: Family Medicine | Admitting: Family Medicine

## 2020-05-02 ENCOUNTER — Encounter (HOSPITAL_COMMUNITY)
Admission: RE | Admit: 2020-05-02 | Discharge: 2020-05-02 | Disposition: A | Discharge status: Home / Self Care | Payer: Medicare Other | Source: Ambulatory Visit | Attending: Family Medicine | Admitting: Family Medicine

## 2020-05-02 ENCOUNTER — Encounter (HOSPITAL_COMMUNITY): Payer: Medicare Other

## 2020-05-02 ENCOUNTER — Encounter (HOSPITAL_COMMUNITY): Payer: Self-pay

## 2020-05-02 DIAGNOSIS — I214 Non-ST elevation (NSTEMI) myocardial infarction: Secondary | ICD-10-CM | POA: Insufficient documentation

## 2020-05-02 DIAGNOSIS — R079 Chest pain, unspecified: Secondary | ICD-10-CM

## 2020-05-02 LAB — NM MYOCAR MULTI W/SPECT W/WALL MOTION / EF
LV dias vol: 235 mL (ref 62–150)
LV sys vol: 185 mL
Peak HR: 111 {beats}/min
RATE: 0.47
Rest HR: 99 {beats}/min
SDS: 5
SRS: 19
SSS: 24
TID: 1.02

## 2020-05-02 MED ORDER — SODIUM CHLORIDE FLUSH 0.9 % IV SOLN
INTRAVENOUS | Status: AC
  Administered 2020-05-02: 10 mL via INTRAVENOUS
  Filled 2020-05-02: qty 10

## 2020-05-02 MED ORDER — REGADENOSON 0.4 MG/5ML IV SOLN
INTRAVENOUS | Status: AC
  Administered 2020-05-02: 0.4 mg via INTRAVENOUS
  Filled 2020-05-02: qty 5

## 2020-05-02 MED ORDER — TECHNETIUM TC 99M TETROFOSMIN IV KIT
30.0000 | PACK | Freq: Once | INTRAVENOUS | Status: AC | PRN
  Administered 2020-05-02: 32 via INTRAVENOUS

## 2020-05-02 MED ORDER — TECHNETIUM TC 99M TETROFOSMIN IV KIT
10.0000 | PACK | Freq: Once | INTRAVENOUS | Status: AC | PRN
  Administered 2020-05-02: 10 via INTRAVENOUS

## 2020-05-02 NOTE — Telephone Encounter (Signed)
Patient has already arrived for appointment.

## 2020-05-04 NOTE — Progress Notes (Addendum)
Cardiology Office Note  Date: 05/05/2020   ID: Daniel Myers, Daniel Myers 1945-04-26, MRN [id number]  PCP:  Claretta Fraise, MD  Cardiologist:  Rozann Lesches, MD Electrophysiologist:  None   Chief Complaint:  Cardiac follow up  History of Present Illness: Daniel Myers is a 75 y.o. male with a history of NSTEMI, Atrial fibrillation, AKI on CKD, HTN, DM2, HLD, Covid pneumonia, acute respiratory failure with hypoxia, hyponatremia, renal transplant recipient (right), CKD, Thrombocytopenia, Systolic and diastolic HF.   Presented 02/11/2020 AP with SOB, CP, respiratory failure secondary to Covid pneumonia. Positive for NSTEMI and transferred to [facility].  Troponins were; [phone].  He developed volume overload and hyponatremia. Responded well to diuresis and hyponatremia was eventually corrected. He was continuing BB and ASA. Not on ACEI or ARBs d/t renal disease. Ischemic workup being contemplated by cardiology as an outpatient. Hypoxia resolved with tx of Covid and CHF. Echo showed mild LV dysfunction and segmental wall motion abnormalities. Daughter was to arrange follow up with nephrology. He was started on Apixaban and BB was increased for HR control. CHA2DS2-Vasc = 5. Cardiology recommended functional testing after recovery from Covid.  Platelets were 100 recent blood draw at PCP office on 02/29/2020    He had a recent abnormal stress test which was forwarded to Dr. Domenic Polite for review.  His assessment was to have follow-up limited echocardiogram to reassess LVEF and ensure no actual decline. He would be high risk for contrast nephropathy were we to pursue a cardiac catheterization at this time, and medical therapy makes the most sense as long as he is clinically stable. If LVEF has actually declined, this may need to be reconsidered. Please make sure that there is pending office follow-up for further discussion of this complex situation.   He is here for follow-up today with 2 of his  daughters.  He states he is feeling a little bit better.  He has been having physical therapy come out to his house and helping him with physical rehab.  His daughter states he is gaining some weight was some more swelling in his legs and abdomen.  They state he recently saw nephrology who prescribed Bactrim.  Both daughters state he has been on chronic antibiotic therapy for a while now.  Daughter states the nephrologist did not prescribe make any changes to diuretic therapy at that appointment.  He is sitting in a wheelchair today.  Currently denies any shortness of breath.  He does state he can walk around his house and do normal ADLs.  He states he does get slightly short of breath and tires easily.  EKG today shows atrial fibrillation with rate of 100.  Occasional PVC.  He denies any bleeding on anticoagulation therapy.  We spoke about follow-up limited echo requested by Dr. Domenic Polite.  He is willing to proceed.  Past Medical History:  Diagnosis Date  . Acute respiratory failure with hypoxia ([place]) 02/12/2020  . AKI (acute kidney injury) ([place]) 02/13/2020  . Atrial fibrillation with RVR ([place]) 03/11/2020  . Basal cell carcinoma 06/27/2013   nodular on left jawline - excision  . Basal cell carcinoma 07/01/2014   nodular on left hawling - CX3+5FU+excision  . Basal cell carcinoma 10/10/2014   nodular on right forearm, middle - tx p bx  . Basal cell carcinoma 02/11/2015   left neck - CX3 + excision  . Basal cell carcinoma 06/14/2016   left jawline - CX3+5FU  . Basal cell carcinoma 02/22/2017   superficial and nodular  on left neck - excision  . Basal cell carcinoma 04/11/2018   superficial and nodular on right inferior forearm - CX3+Cautery+5FU  . Chronic kidney disease   . History of renal transplant   . Hyperlipidemia   . Hypertension   . NSTEMI (non-ST elevated myocardial infarction) ([place]) 02/13/2020  . 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 Squamous cell  carcinoma of skin 08/05/2010   hypertrophic on left ear - CX3+5FU  . 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 Squamous cell carcinoma of skin 11/08/2011   right upper forearm - tx p bx  . Squamous cell carcinoma of skin 11/08/2011   left upper forearm - tx p bx  . Squamous cell carcinoma of skin 11/08/2011   left hand - tx p bx  . 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/27/2013   in situ on left lower back - CX3+5FU  . 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/27/2013   in situ on left forehead - CX3+5FU  . 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/27/2013   in situ on right temple - watch per ST  . 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/27/2013   in situ on right forearm - CX3+5FU  . Squamous cell carcinoma of skin 07/01/2014   well differentiated on right sideburn - CX3+5FU  . Squamous cell carcinoma of skin 07/01/2014   in situ on left shoulder - CX3+5FU  . Squamous cell carcinoma of skin 07/01/2014   in situ on right forearm, proximal - CX3+5FU+Cautery  . Squamous cell carcinoma of skin 07/01/2014   in situ on right forearm, distal - CX3+5FU  . Squamous cell carcinoma of skin 09/30/2015   in situ on posterior left ear - CX3+5FU  . Squamous cell carcinoma of skin 02/22/2017   in situ on right upper arm - CX3+5FU  . Squamous cell carcinoma of skin 02/22/2017   in situ on left upper arm - CX3+5FU  . Squamous cell carcinoma of skin 04/11/2018   in situ on right temple - CX3+5FU  . Squamous cell carcinoma of skin 04/11/2018   in situ on lateral right arm - MOHs  . Squamous cell carcinoma of skin 04/11/2018   in situ on right upper arm - MOHs  . Squamous cell carcinoma of skin 04/11/2018   in situ on left sideburn  . Squamous cell carcinoma of skin 04/11/2018   in situ on right flank - tx p bx  . Squamous cell carcinoma of skin 09/28/2018   in situ on right inner ear - tx p bx  . Squamous cell carcinoma of skin 09/28/2018   in situ on left inner ear - tx p  bx  . Squamous cell carcinoma of skin 09/28/2018   in situ on right arm - tx p bx  . Squamous cell carcinoma of skin 03/21/2019   in situ on right antihelix ([place] treated topically)  . Squamous cell carcinoma of skin 03/21/2019   in situ on right neck - CX3+5FU  . Squamous cell carcinoma of skin 03/21/2019   in situ on left outer eye, inf (MOHs done 05/23/2019)  . Type 2 diabetes mellitus ([place])   . Unspecified atrial fibrillation ([place]) 02/17/2020    Past Surgical History:  Procedure Laterality Date  . KIDNEY TRANSPLANT Right     Current Outpatient Medications  Medication Sig Dispense Refill  . albuterol (VENTOLIN HFA) 108 (90 Base)  MCG/ACT inhaler Inhale 2 puffs into the lungs every 6 (six) hours as needed for wheezing or shortness of breath. 1 each 3  . ALPRAZolam (XANAX) 0.25 MG tablet Take 0.5 mg by mouth at bedtime as needed for anxiety.    Marland Kitchen apixaban (ELIQUIS) 5 MG TABS tablet Take 1 tablet (5 mg total) by mouth 2 (two) times daily. 60 tablet 2  . ascorbic acid (VITAMIN C) 500 MG tablet Take 1 tablet (500 mg total) by mouth daily.    Marland Kitchen atorvastatin (LIPITOR) 10 MG tablet Take 1 tablet (10 mg total) by mouth daily. 90 tablet 1  . blood glucose meter kit and supplies Dispense based on patient and insurance preference. Use up to four times daily as directed. (FOR ICD-10 E10.9, E11.9). 1 each 0  . fexofenadine-pseudoephedrine (ALLEGRA-D 24) 180-240 MG 24 hr tablet Take 1 tablet by mouth every evening. For allergy and congestion 30 tablet 11  . fluorouracil (EFUDEX) 5 % cream Apply 1 application topically daily.    . fluticasone (FLONASE) 50 MCG/ACT nasal spray USE ONE SPRAY(S) IN EACH NOSTRIL ONCE DAILY 16 g 11  . furosemide (LASIX) 40 MG tablet Take 1 tablet (40 mg total) by mouth daily. 30 tablet 1  . insulin NPH Human (NOVOLIN N) 100 UNIT/ML injection 30 units sq AC breakfast, 40 units sq ac supper 30 mL 11  . Insulin Regular Human 100 UNIT/ML SOPN Inject 12 units before lunch and  7 units before supper. 10 mL prn  . levofloxacin (LEVAQUIN) 500 MG tablet Take 1 tablet (500 mg total) by mouth daily. For 10 days 10 tablet 0  . loratadine (CLARITIN) 10 MG tablet Take 10 mg by mouth daily as needed for allergies.    . metoprolol tartrate (LOPRESSOR) 50 MG tablet Take 1 tablet (50 mg total) by mouth 2 (two) times daily. 60 tablet 2  . mycophenolate (CELLCEPT) 250 MG capsule TAKE 2 CAPSULES BY MOUTH TWO TIMES DAILY.    Marland Kitchen ondansetron (ZOFRAN) 4 MG tablet Take 1 tablet (4 mg total) by mouth every 8 (eight) hours as needed for nausea or vomiting. 20 tablet 0  . potassium chloride SA (KLOR-CON) 20 MEQ tablet Take 1 tablet (20 mEq total) by mouth daily. For potassium replacement/ supplement 30 tablet 5  . predniSONE (DELTASONE) 5 MG tablet Take 5 mg by mouth daily with breakfast.    . RELION INSULIN SYRINGE 31G X 15/64" 0.5 ML MISC USE AS DIRECTED FOR INSULIN    . sodium bicarbonate 650 MG tablet Take 650 mg by mouth 2 (two) times daily.    . tacrolimus (PROGRAF) 0.5 MG capsule TAKE 2 CAPSULES BY MOUTH TWO TIMES DAILY.    Marland Kitchen zinc sulfate 220 (50 Zn) MG capsule Take 1 capsule (220 mg total) by mouth daily.     No current facility-administered medications for this visit.   Allergies:  Other, Tape, Trazodone and nefazodone, Mirtazapine, and Elemental sulfur   Social History: The patient  reports that he has quit smoking. His smoking use included pipe. He has never used smokeless tobacco. He reports that he does not drink alcohol and does not use drugs.   Family History: The patient's family history includes Clotting disorder in his mother; Diabetes in his sister; Hypertension in his sister.   ROS:  Please see the history of present illness. Otherwise, complete review of systems is positive for none.  All other systems are reviewed and negative.   Physical Exam: VS:  BP 118/70   Pulse  88   Ht '5\' 10"'  (1.778 m)   Wt 230 lb 6.4 oz (104.5 kg)   SpO2 95%   BMI 33.06 kg/m , BMI Body  mass index is 33.06 kg/m.  Wt Readings from Last 3 Encounters:  05/05/20 230 lb 6.4 oz (104.5 kg)  03/27/20 209 lb 8 oz (95 kg)  03/06/20 210 lb (95.3 kg)    General: Patient appears comfortable at rest. Neck: Supple, no elevated JVP or carotid bruits, no thyromegaly. Lungs: Clear to auscultation, nonlabored breathing at rest. Cardiac: Irregularly irregular rate and rhythm, no S3 or significant systolic murmur, no pericardial rub. Extremities: Mild pitting edema, distal pulses 2+. Skin: Warm and dry. Musculoskeletal: No kyphosis. Neuropsychiatric: Alert and oriented x3, affect grossly appropriate.  ECG: May 05, 2020 EKG atrial fibrillation with premature ventricular or aberrantly conducted complexes rate of 100.  Inferior infarct, age undetermined.  Anterolateral infarct, age undetermined.  Recent Labwork: 02/11/2020: B Natriuretic Peptide 842.0 02/17/2020: Magnesium 2.4 04/17/2020: ALT 54; AST 44; BUN 49; Creatinine, Ser 2.01; Hemoglobin 12.4; Platelets 123; Potassium 5.2; Sodium 127     Component Value Date/Time   CHOL 119 04/03/2020 1027   TRIG 211 (H) 04/03/2020 1027   HDL 23 (L) 04/03/2020 1027   CHOLHDL 5.2 (H) 04/03/2020 1027   [place] 61 04/03/2020 1027    Other Studies Reviewed Today:   Stress Myoview 05/02/2020   Narrative & Impression   Lexiscan stress is electrically negative for ischemia  Myovue scan shows large fixed defect in the inferior and inferolateral walls (base, mid, distal); distal anterior wall and apex consistent with scar and some possible soft tissue attenuation; no ischemia.  LVEF calculated at 21% with diffuse hypokinesis  Overall high risk scan      Echocardiogram 02/12/2020 1. Left ventricular ejection fraction, by estimation, is 40 to 45%. The left ventricle has mildly decreased function. The left ventricle demonstrates regional wall motion abnormalities (see scoring diagram/findings for description). The left ventricular internal cavity  size was mildly dilated. There is mild left ventricular hypertrophy. Left ventricular diastolic parameters are suggestive of Grade III diastolic dysfunction (restrictive). 2. Right ventricular systolic function is normal. The right ventricular size is normal. Tricuspid regurgitation signal is inadequate for assessing PA pressure. 3. Left atrial size was severely dilated. 4. The mitral valve is grossly normal, mildly calcified. Trivial mitral valve regurgitation. 5. The aortic valve is tricuspid. Aortic valve regurgitation is not visualized. 6. The inferior vena cava is dilated in size with >50% respiratory variability, suggesting right atrial pressure of 8 mmHg.  Assessment and Plan:  1. NSTEMI (non-ST elevated myocardial infarction) ([place])   2. Atrial fibrillation, unspecified type ([place])   3. Acute on chronic combined systolic (congestive) and diastolic (congestive) heart failure (HCC)   4. Stage 3 chronic kidney disease, unspecified whether stage 3a or 3b CKD ([place])   5. Hyponatremia   6. Chest pain, unspecified type   7. Decreased cardiac ejection fraction    1. NSTEMI (non-ST elevated myocardial infarction) [facility]) Recent hospital admission with NSTEMI with elevated troponins and concomitant Covid pneumonia.  Cardiology was consulted and recommended outpatient ischemia work-up after recovering from Covid pneumonia.  Patient denies any recent chest pain, pressure, tightness.  Discussed recent stress test results with patient and daughters.  Both patient and daughters verbalized understanding.  2. Atrial fibrillation, unspecified type ([place]) He remains in atrial fibrillation today with current rate of 100 on EKG.  His PCP had reduced his metoprolol from 50 mg p.o. twice daily  down to 25 mg twice daily yesterday.  Continue metoprolol 25 mg p.o. twice daily.  Continue Eliquis 5 mg p.o. twice daily.  3. Acute on chronic combined systolic (congestive) and diastolic (congestive) heart failure  ([place]) Recent hospital admission with NSTEMI, Covid pneumonia and volume overload.  He was diuresed and did well.  Daughter states he had been maintaining his weight around 215 pounds.  Today his weight was 230 pounds.  He appears to have some increased lower extremity edema.  Given recent decrease in renal function continue Lasix 40 mg daily. Please get a limited repeat echo to reassess LV function, diastolic function, valvular function.   4. Stage 3 chronic kidney disease, unspecified whether stage 3a or 3b CKD ([place]) Daughter state he recently had follow-up with nephrology.  Current renal function as of 04/17/2020 showed creatinine of 2.01 and GFR 34.  Given worsening of renal function would not increase diuretic therapy.  Continue Lasix 40 mg milligrams daily.  5. Hyponatremia Recent labs at PCP office show sodium at 127.  6. Chest pain, unspecified type Recent NSTEMI/chest pain with concomitant Covid pneumonia and volume overload with new onset atrial fibrillation.  Recent stress test results as noted above.  Dr. Domenic Polite would like to have a repeat limited echo to reassess LV function, diastolic function, valvular function.  Medication Adjustments/Labs and Tests Ordered: Current medicines are reviewed at length with the patient today.  Concerns regarding medicines are outlined above.   Disposition: Follow-up with Dr. Domenic Polite or APP 4 to 6 weeks Signed, Levell July, NP 05/05/2020 11:41 AM    [facility] [place], [place], [place] [address] Phone: [phone]; Fax: [phone]

## 2020-05-05 ENCOUNTER — Encounter: Payer: Self-pay | Admitting: Family Medicine

## 2020-05-05 ENCOUNTER — Ambulatory Visit (INDEPENDENT_AMBULATORY_CARE_PROVIDER_SITE_OTHER): Payer: Medicare Other | Admitting: Family Medicine

## 2020-05-05 ENCOUNTER — Other Ambulatory Visit: Payer: Self-pay

## 2020-05-05 VITALS — BP 118/70 | HR 88 | Ht 70.0 in | Wt 230.4 lb

## 2020-05-05 DIAGNOSIS — I214 Non-ST elevation (NSTEMI) myocardial infarction: Secondary | ICD-10-CM

## 2020-05-05 DIAGNOSIS — I4891 Unspecified atrial fibrillation: Secondary | ICD-10-CM | POA: Diagnosis not present

## 2020-05-05 DIAGNOSIS — N183 Chronic kidney disease, stage 3 unspecified: Secondary | ICD-10-CM | POA: Diagnosis not present

## 2020-05-05 DIAGNOSIS — E871 Hypo-osmolality and hyponatremia: Secondary | ICD-10-CM

## 2020-05-05 DIAGNOSIS — I5043 Acute on chronic combined systolic (congestive) and diastolic (congestive) heart failure: Secondary | ICD-10-CM | POA: Diagnosis not present

## 2020-05-05 DIAGNOSIS — R931 Abnormal findings on diagnostic imaging of heart and coronary circulation: Secondary | ICD-10-CM

## 2020-05-05 DIAGNOSIS — R079 Chest pain, unspecified: Secondary | ICD-10-CM

## 2020-05-05 NOTE — Patient Instructions (Signed)
Medication Instructions:  Continue all current medications.  Labwork: none  Testing/Procedures:  Your physician has requested that you have a limited echocardiogram. Echocardiography is a painless test that uses sound waves to create images of your heart. It provides your doctor with information about the size and shape of your heart and how well your heart's chambers and valves are working. This procedure takes approximately one hour. There are no restrictions for this procedure.  Office will contact with results via phone or letter.    Follow-Up: 4-6 weeks   Any Other Special Instructions Will Be Listed Below (If Applicable).  If you need a refill on your cardiac medications before your next appointment, please call your pharmacy.

## 2020-05-06 ENCOUNTER — Ambulatory Visit: Payer: Medicare Other | Admitting: *Deleted

## 2020-05-06 DIAGNOSIS — E119 Type 2 diabetes mellitus without complications: Secondary | ICD-10-CM

## 2020-05-06 DIAGNOSIS — I10 Essential (primary) hypertension: Secondary | ICD-10-CM

## 2020-05-06 DIAGNOSIS — I4819 Other persistent atrial fibrillation: Secondary | ICD-10-CM | POA: Diagnosis not present

## 2020-05-06 DIAGNOSIS — N183 Chronic kidney disease, stage 3 unspecified: Secondary | ICD-10-CM

## 2020-05-06 DIAGNOSIS — I252 Old myocardial infarction: Secondary | ICD-10-CM | POA: Diagnosis not present

## 2020-05-06 DIAGNOSIS — I5041 Acute combined systolic (congestive) and diastolic (congestive) heart failure: Secondary | ICD-10-CM

## 2020-05-06 DIAGNOSIS — E782 Mixed hyperlipidemia: Secondary | ICD-10-CM

## 2020-05-06 DIAGNOSIS — Z794 Long term (current) use of insulin: Secondary | ICD-10-CM

## 2020-05-06 DIAGNOSIS — I639 Cerebral infarction, unspecified: Secondary | ICD-10-CM

## 2020-05-06 NOTE — Patient Instructions (Signed)
Visit Information  PATIENT GOALS: Goals Addressed            This Visit's Progress   . Improve My Quality of Life   On track    Timeframe:  Short-Term Goal Priority:  High Start Date: 03/12/20                            Expected End Date: 09/09/20                      Follow Up Date 06/10/20   . Work with home health nursing and physical therapy to improve strength, balance, and overall health . Continue to use walker for ambulation . Work with Consulting civil engineer to discuss ways to self-manage medical conditions [phone] . Work with LCSW to discuss coping mechanisms and stress management [phone] . Keep appointment with PCP on 05/07/20 . Reach out to PCP with any new or worsening problems [phone] . Eat meals at regular intervals in order to maintain blood sugar levels and strength   Why is this important?    Having a long-term illness can be scary.   It can also be stressful for you and your caregiver.   These steps may help.    Notes:     Marland Kitchen Monitor and Manage My Blood Sugar-Diabetes Type 2   On track    Timeframe:  Long-Range Goal Priority:  Medium Start Date:  2/2/222                           Expected End Date: 09/09/20                      Follow Up Date 06/10/20   . check blood sugar at prescribed times . check blood sugar if I feel it is too high or too low . enter blood sugar readings and medication or insulin into daily log . take the blood sugar log to all doctor visits  . Call PCP [phone] with any readings outside of recommended range . Eat meals at regular intervals . Follow a diabetic diet for blood sugar control . Reach out to [place] with any questions [phone]   Why is this important?    Checking your blood sugar at home helps to keep it from getting very high or very low.   Writing the results in a diary or log helps the doctor know how to care for you.   Your blood sugar log should have the time, date and the results.    Also, write down the amount of insulin or other medicine that you take.   Other information, like what you ate, exercise done and how you were feeling, will also be helpful.     Notes:     . Track and Manage Fluids and Swelling-Heart Failure   Not on track    Timeframe:  Long-Range Goal Priority:  Medium Start Date:  05/06/20                           Expected End Date: 05/06/21                      Follow Up Date 06/10/20    . Weight and record weights daily . Call cardiologist with any weight gain of more than 2 lbs  overnight or more than 5 lbs in a week . Keep legs propped up when sitting . Call cardiologist or seek medical attention for any new or worsening symptoms (increased SOB, increased swelling, etc) . Keep all medical appointments . Call RN Care Manager as needed [phone]   Why is this important?    It is important to check your weight daily and watch how much salt and liquids you have.   It will help you to manage your heart failure.    Notes:        Patient verbalizes understanding of instructions provided today and agrees to view in [place].    Follow Up Plan:  . Telephone follow up appointment with care management team member scheduled for:  06/10/20 with RN Care Manager, 05/16/20 with LCSW . The patient has been provided with contact information for the care management team and has been advised to call with any health related questions or concerns.  . Next PCP appointment scheduled for: 05/07/20 with Dr Livia Snellen . Echocardiogram appointment: 06/05/20 . Cardiology appointment 06/06/20 . CT chest at [facility] scheduled for 05/13/20 . Nephrology appointment scheduled with Dr Joseph Berkshire at [facility] for 08/07/20  [name], BSN, RN-BC [place] [facility] [place] Management Direct Dial: [phone]

## 2020-05-06 NOTE — Chronic Care Management (AMB) (Signed)
Chronic Care Management   CCM RN Visit Note  05/06/2020 Name: Daniel Myers MRN: [id number] DOB: 05-20-1945  Subjective: Daniel Myers is a 75 y.o. year old male who is a primary care patient of Stacks, [name], MD. The care management team was consulted for assistance with disease management and care coordination needs.    Engaged with patient's daughter, Daniel Myers, by telephone for follow up visit in response to provider referral for case management and/or care coordination services.   Consent to Services:  The patient was given information about Chronic Care Management services, agreed to services, and gave verbal consent prior to initiation of services.  Please see initial visit note for detailed documentation.   Patient agreed to services and verbal consent obtained.   Assessment: Review of patient past medical history, allergies, medications, health status, including review of consultants reports, laboratory and other test data, was performed as part of comprehensive evaluation and provision of chronic care management services.   SDOH (Social Determinants of Health) assessments and interventions performed:    CCM Care Plan  Allergies  Allergen Reactions  . Other Rash    Use paper tape.   . Tape Rash    Use paper tape.   . Trazodone And Nefazodone Palpitations    tachycardia  . Mirtazapine     imbalance  . Elemental Sulfur Rash    Outpatient Encounter Medications as of 05/06/2020  Medication Sig Note  . albuterol (VENTOLIN HFA) 108 (90 Base) MCG/ACT inhaler Inhale 2 puffs into the lungs every 6 (six) hours as needed for wheezing or shortness of breath.   . ALPRAZolam (XANAX) 0.25 MG tablet Take 0.5 mg by mouth at bedtime as needed for anxiety.   Marland Kitchen apixaban (ELIQUIS) 5 MG TABS tablet Take 1 tablet (5 mg total) by mouth 2 (two) times daily.   Marland Kitchen ascorbic acid (VITAMIN C) 500 MG tablet Take 1 tablet (500 mg total) by mouth daily.   Marland Kitchen atorvastatin (LIPITOR) 10 MG tablet Take 1  tablet (10 mg total) by mouth daily.   . blood glucose meter kit and supplies Dispense based on patient and insurance preference. Use up to four times daily as directed. (FOR ICD-10 E10.9, E11.9).   . fexofenadine-pseudoephedrine (ALLEGRA-D 24) 180-240 MG 24 hr tablet Take 1 tablet by mouth every evening. For allergy and congestion   . fluorouracil (EFUDEX) 5 % cream Apply 1 application topically daily.   . fluticasone (FLONASE) 50 MCG/ACT nasal spray USE ONE SPRAY(S) IN EACH NOSTRIL ONCE DAILY   . furosemide (LASIX) 40 MG tablet Take 1 tablet (40 mg total) by mouth daily.   . insulin NPH Human (NOVOLIN N) 100 UNIT/ML injection 30 units sq AC breakfast, 40 units sq ac supper 03/12/2020: 03/12/2020 Per daughter, Daniel Myers, taking Relion N 27 units in the morning and 27 units qhs  . Insulin Regular Human 100 UNIT/ML SOPN Inject 12 units before lunch and 7 units before supper. 03/12/2020: 03/12/2020 Per daughter, Daniel Myers, taking Relion R 20 units TID with meals (when meals are eaten)  . levofloxacin (LEVAQUIN) 500 MG tablet Take 1 tablet (500 mg total) by mouth daily. For 10 days   . loratadine (CLARITIN) 10 MG tablet Take 10 mg by mouth daily as needed for allergies.   . metoprolol tartrate (LOPRESSOR) 50 MG tablet Take 1 tablet (50 mg total) by mouth 2 (two) times daily.   . mycophenolate (CELLCEPT) 250 MG capsule TAKE 2 CAPSULES BY MOUTH TWO TIMES DAILY.   Marland Kitchen ondansetron (ZOFRAN)  4 MG tablet Take 1 tablet (4 mg total) by mouth every 8 (eight) hours as needed for nausea or vomiting.   . potassium chloride SA (KLOR-CON) 20 MEQ tablet Take 1 tablet (20 mEq total) by mouth daily. For potassium replacement/ supplement   . predniSONE (DELTASONE) 5 MG tablet Take 5 mg by mouth daily with breakfast.   . RELION INSULIN SYRINGE 31G X 15/64" 0.5 ML MISC USE AS DIRECTED FOR INSULIN   . sodium bicarbonate 650 MG tablet Take 650 mg by mouth 2 (two) times daily.   . tacrolimus (PROGRAF) 0.5 MG capsule TAKE 2 CAPSULES BY MOUTH TWO  TIMES DAILY.   Marland Kitchen zinc sulfate 220 (50 Zn) MG capsule Take 1 capsule (220 mg total) by mouth daily.    No facility-administered encounter medications on file as of 05/06/2020.    Patient Active Problem List   Diagnosis Date Noted  . Persistent atrial fibrillation ([place]) 03/11/2020  . Systolic and diastolic CHF, acute ([place]) 02/13/2020  . Thrombocytopenia ([place]) 02/13/2020  . Pneumonia due to COVID-19 virus 02/12/2020  . Hyperglycemia due to diabetes mellitus ([place]) 02/12/2020  . CKD (chronic kidney disease), stage III ([place]) 02/12/2020  . Essential hypertension 11/22/2018  . Insulin dependent type 2 diabetes mellitus ([place]) 11/22/2018  . 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 Mixed hyperlipidemia 11/22/2018  . Vision decreased 11/22/2018  . Hyponatremia 11/22/2018  . Seasonal allergic rhinitis due to pollen 11/22/2018    Conditions to be addressed/monitored:Atrial Fibrillation, CHF, HTN, HLD, DMII and CKD  Care Plan : RNCM: General Plan of Care (Adult)  Updates made by Ilean China, RN since 05/06/2020 12:00 AM    Problem: Quality of Life (General Plan of Care)     Goal: Quality of Life Improved   Start Date: 03/12/2020  This Visit's Progress: On track  Recent Progress: On track  Priority: High  Note:   Current Barriers:  . Care Coordination needs related to improving quality of life and independence in a patient with HTN, HLD, CKD, CHF, DM, AFib, hx of MI, recent covid infection with pneumonia, and new onset left sided weakness . Unable to perform IADLs independently  Nurse Case Manager Clinical Goal(s):  . patient will work with Consulting civil engineer to address needs related to left sided weakness and decreased ability to care for himself s/p CVA and covid infection . Patient will work with home health physical therapy and nursing to improve strength, mobility, and ability to perform ADLs independently.   Interventions:  . 1:1 collaboration with Claretta Fraise, MD regarding  development and update of comprehensive plan of care as evidenced by provider attestation and co-signature . Inter-disciplinary care team collaboration (see longitudinal plan of care) . Chart reviewed including relevant office notes, referral notes, lab results, orders, and imaging reports . Reviewed mobility and ability to perform ADLs. o Patient uses a standard walker in the home and has a wheelchair for appointments o Ramp at the front door o Able to feed himself but food has to be prepared for him o Increased ability to do ADLs independently with assistance at times o Occupational therapy sessions have ended. He is able to perform all of the activities they were working with on . Discussed home health services with daughter, Daniel Myers o Patient is receiving nursing services for wound care and general nursing needs - Wounds are legs are looking much better. They are cleaning with NS, applying antibiotic ointment, and covering with tegarderm - Has weeping edema bilateral LE. Nurse  wrapping both legs. o Receiving PT services for strength, balance, and gait training - Last session is today . Reviewed and discussed upcoming appointments . Previously discussed family/social support o Two daughters and one son o Lives alone but Daniel Myers has been staying with him overnight for the past week since his fall o Daniel Myers also provides most of his support and care during the day. She has some help from [place] during the week and on the weekends. Clay isn't able to provide much assistance due to his own medical conditions but does visit often.  o Family is able to provide transportation, buy groceries, provide meals, help with ADLs . Discussed sleep o Patient is napping some during the day and sleeps through the night now . Discussed diet  o Appetite has returned to normal o Reinforced diabetic diet for blood sugar management - Proper nutrition with protein, vegetables, and complex carbs will provide energy and will  help to improve strength and healing . Medications reviewed and discussed . Encouraged to talk with LCSW regarding psychosocial needs . Provided with RNCM contact number and encouraged to reach out as needed . Encouraged to reach out to PCP with any new or worsening symptoms  Patient Goals/Self-Care Activities Over the next 30 days, patient will: . Work with home health nursing and physical therapy to improve strength, balance, and overall health . Continue to use walker for ambulation . Work with Consulting civil engineer to discuss ways to self-manage medical conditions [phone] . Work with LCSW to discuss coping mechanisms and stress management [phone] . Keep appointment with PCP on 05/07/20 . Reach out to PCP with any new or worsening problems [phone] . Eat meals at regular intervals in order to maintain blood sugar levels and strength    Care Plan : RNCM: Diabetes Type 2 (Adult)  Updates made by Ilean China, RN since 05/06/2020 12:00 AM    Problem: Glycemic Management (Diabetes, Type 2)   Priority: Medium    Long-Range Goal: Glycemic Management Optimized   Start Date: 03/12/2020  This Visit's Progress: On track  Recent Progress: On track  Priority: Medium  Note:   Current Barriers:  . Chronic Disease Management support and education needs related to diabetes in a patient with HTN, HLD, CKD, Afib, hx of MI . Requires some assistance with ADLs and IADLs  Nurse Case Manager Clinical Goal(s):  . patient will work with Consulting civil engineer to address needs related to self management of diabetes . patient will demonstrate improved adherence to prescribed treatment plan for diabetes as evidenced by daily blood sugar readings within recommended range  Interventions:  . 1:1 collaboration with Claretta Fraise, MD regarding development and update of comprehensive plan of care as evidenced by provider attestation and co-signature . Inter-disciplinary care team collaboration (see  longitudinal plan of care) . Evaluation of current treatment plan related to daibetes and patient's adherence to plan as established by provider. . Chart reviewed including relevant office notes and lab results . Reviewed and discussed medications o Compliant with medications o No recent changes . Discussed home blood sugar monitoring o Testing 3 to 4 times a day o Typically 100-150 fasting o 200-300 during the day o No episodes below 70 over the past two weeks . Continue to check and record blood sugar 3 to 4 times a day and call PCP with any readings outside of recommended range . Discussed diet o Appetite has returned to normal o Reinforced diabetic diet and recommended to provide meals  at regular intervals  . Reviewed and discussed upcoming appointments . Encouraged to reach out to [facility] as needed  Patient Goals/Self-Care Activities Over the next 30 days, patient will: . check blood sugar at prescribed times . check blood sugar if I feel it is too high or too low . enter blood sugar readings and medication or insulin into daily log . take the blood sugar log to all doctor visits  . Call PCP [phone] with any readings outside of recommended range . Eat meals at regular intervals . Follow a diabetic diet for blood sugar control . Reach out to [place] with any questions [phone]    Care Plan : RNCM: Heart Failure (Adult)  Updates made by Ilean China, RN since 05/06/2020 12:00 AM    Problem: Symptom Exacerbation (Heart Failure)   Priority: Medium    Long-Range Goal: Symptom Exacerbation Prevented or Minimized   Start Date: 05/06/2020  This Visit's Progress: Not on track  Priority: Medium  Note:   Current Barriers:  . Chronic Disease Management support and education needs related to CHF in a patient with HTN, DM, CKD, Afib and recent CVA . Needs some assistance with ADLs and IADLs  Nurse Case Manager Clinical Goal(s):  . patient will work with  cardiologist to address needs related to medical management of CHF . patient will meet with RN Care Manager to address self-management of CHF  Interventions:  . 1:1 collaboration with Claretta Fraise, MD regarding development and update of comprehensive plan of care as evidenced by provider attestation and co-signature . Inter-disciplinary care team collaboration (see longitudinal plan of care) . Evaluation of current treatment plan related to CHF and patient's adherence to plan as established by provider. . Chart reviewed including relevant office notes, lab results, and imaging studies o Discussed recent limited echo and upcoming appt for full echo in April . Reviewed and discussed medications o Lasix 69m daily o Cardiology has not increased due to CKD . Discussed fluid retention o Per daughter, THerbert Myers patient has bilateral lower extremity weeping edema and some abd swelling. This was noted in cardiology office visit not as well.  o Home health nurse is wrapping both legs weekly . Educated daughter on need for daily weights each morning after urinating . Advised to call cardiologist with any weight gain of more than 2 lbs overnight or 5 lbs in one week . Reviewed and discussed upcoming appointments  Patient Goals/Self-Care Activities Over the next 30 days, patient will: . Weight and record weights daily . Call cardiologist with any weight gain of more than 2 lbs overnight or more than 5 lbs in a week . Keep legs propped up when sitting . Call cardiologist or seek medical attention for any new or worsening symptoms (increased SOB, increased swelling, etc) . Keep all medical appointments . Call RN Care Manager as needed [phone] .        Follow Up Plan:  . Telephone follow up appointment with care management team member scheduled for:  06/10/20 with RN Care Manager, 05/16/20 with LCSW . The patient has been provided with contact information for the care management team and has  been advised to call with any health related questions or concerns.  . Next PCP appointment scheduled for: 05/07/20 with Dr SLivia Snellen. Echocardiogram appointment: 06/05/20 . Cardiology appointment 06/06/20 . [facility] . [facility]  KChong Sicilian  BSN, RN-BC Embedded Chronic Care Manager [facility] Direct Dial: [phone]

## 2020-05-07 ENCOUNTER — Other Ambulatory Visit: Payer: Self-pay | Admitting: Family Medicine

## 2020-05-07 ENCOUNTER — Ambulatory Visit (INDEPENDENT_AMBULATORY_CARE_PROVIDER_SITE_OTHER): Payer: Medicare Other | Admitting: Family Medicine

## 2020-05-07 ENCOUNTER — Ambulatory Visit (INDEPENDENT_AMBULATORY_CARE_PROVIDER_SITE_OTHER): Payer: Medicare Other

## 2020-05-07 ENCOUNTER — Encounter: Payer: Self-pay | Admitting: Family Medicine

## 2020-05-07 ENCOUNTER — Other Ambulatory Visit: Payer: Self-pay

## 2020-05-07 VITALS — BP 109/57 | HR 94 | Temp 97.3°F | Ht 70.0 in | Wt 230.6 lb

## 2020-05-07 DIAGNOSIS — U071 COVID-19: Secondary | ICD-10-CM

## 2020-05-07 DIAGNOSIS — J301 Allergic rhinitis due to pollen: Secondary | ICD-10-CM

## 2020-05-07 DIAGNOSIS — J1282 Pneumonia due to coronavirus disease 2019: Secondary | ICD-10-CM

## 2020-05-07 DIAGNOSIS — Z8616 Personal history of COVID-19: Secondary | ICD-10-CM

## 2020-05-07 DIAGNOSIS — I5041 Acute combined systolic (congestive) and diastolic (congestive) heart failure: Secondary | ICD-10-CM

## 2020-05-07 DIAGNOSIS — I10 Essential (primary) hypertension: Secondary | ICD-10-CM

## 2020-05-07 DIAGNOSIS — N1832 Chronic kidney disease, stage 3b: Secondary | ICD-10-CM

## 2020-05-07 DIAGNOSIS — J189 Pneumonia, unspecified organism: Secondary | ICD-10-CM

## 2020-05-07 DIAGNOSIS — I6930 Unspecified sequelae of cerebral infarction: Secondary | ICD-10-CM | POA: Diagnosis not present

## 2020-05-07 MED ORDER — FLUTICASONE PROPIONATE 50 MCG/ACT NA SUSP: NASAL | 11 refills | Status: DC

## 2020-05-07 MED ORDER — SPIRONOLACTONE 25 MG PO TABS: 25.0000 mg | ORAL_TABLET | Freq: Every day | ORAL | 3 refills | Status: DC

## 2020-05-07 MED ORDER — LEVOFLOXACIN 500 MG PO TABS: 500.0000 mg | ORAL_TABLET | Freq: Every day | ORAL | 0 refills | Status: DC

## 2020-05-07 NOTE — Progress Notes (Signed)
Subjective:  Patient ID: Daniel Myers, male    DOB: 07/20/1945  Age: 75 y.o. MRN: [id number]  CC: Follow-up (pneumonia)   HPI Daniel Myers presents for continues with swelling in the legs.  They are wrapped today and they did not unwrap them but they brought in pictures.  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  There appears to be about a 3+ edema.  However they note that they had been weeping.  They did cut back on the Lasix to half of tablet daily based on the creatinine being at 2.  He saw his kidney specialist since he was last here and they report that the nephrologist is okay with a creatinine of 2.  Patient has been taking physical therapy weekly at home since his stroke.  That is about to run out.  He is still not ambulatory independently.  He cannot walk up any steps at all.  They request a extension of his therapy.  He is here today with his 2 daughters.  He continues to have cough and shortness of breath.  It does seem somewhat better today.  In today also for repeat of his chest x-ray  Depression screen Daniel Myers 2/9 05/07/2020 04/24/2020 04/17/2020  Decreased Interest 0 0 0  Down, Depressed, Hopeless 0 0 0  PHQ - 2 Score 0 0 0    History Daniel Myers has a past medical history of Acute respiratory failure with hypoxia ([place]) (02/12/2020), AKI (acute kidney injury) ([place]) (02/13/2020), Atrial fibrillation with RVR ([place]) (03/11/2020), Basal cell carcinoma (06/27/2013), Basal cell carcinoma (07/01/2014), Basal cell carcinoma (10/10/2014), Basal cell carcinoma (02/11/2015), Basal cell carcinoma (06/14/2016), Basal cell carcinoma (02/22/2017), Basal cell carcinoma (04/11/2018), Chronic kidney disease, History of renal transplant, Hyperlipidemia, Hypertension, NSTEMI (non-ST elevated myocardial infarction) ([place]) (02/13/2020), 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), 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), 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),  Squamous cell carcinoma of skin (11/08/2011), Squamous cell carcinoma of skin (11/08/2011), Squamous cell carcinoma of skin (11/08/2011), 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), 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), 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), 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), 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), 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), 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), 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), Squamous cell carcinoma of skin (09/30/2015), Squamous cell carcinoma of skin (02/22/2017), Squamous cell carcinoma of skin (02/22/2017), Squamous cell carcinoma of skin (04/11/2018), Squamous cell carcinoma of skin (04/11/2018), Squamous cell carcinoma of skin (04/11/2018), Squamous cell carcinoma of skin (04/11/2018), Squamous cell carcinoma of skin (04/11/2018), Squamous cell carcinoma of skin (09/28/2018), Squamous cell carcinoma of skin (09/28/2018), Squamous cell carcinoma of skin (09/28/2018), Squamous cell carcinoma of skin (03/21/2019), Squamous cell carcinoma of skin (03/21/2019), Squamous cell carcinoma of skin (03/21/2019), Type 2 diabetes mellitus ([place]), and Unspecified atrial fibrillation ([place]) (02/17/2020).   He has a past surgical history that includes Kidney transplant (Right).   His family history includes Clotting disorder in his mother; Diabetes in his sister; Hypertension in his sister.He reports that he has quit smoking. His smoking use included pipe. He has never used smokeless tobacco. He reports that he does not drink alcohol and does not use drugs.    ROS Review of Systems  Constitutional: Negative for fever.  Respiratory: Negative for shortness of breath.   Cardiovascular: Positive for leg swelling. Negative for chest pain.  Musculoskeletal: Negative for arthralgias.  Skin: Negative for rash.    Objective:  BP (!) 109/57   Pulse 94   Temp (!) 97.3 F (36.3 C)   Ht '5\' 10"'   (1.778 m)   Wt 230 lb 9.6 oz (104.6 kg)   SpO2 96%   BMI 33.09 kg/m   BP Readings from Last 3 Encounters:  05/07/20 (!) 109/57  05/05/20 118/70  04/24/20 111/72    Wt Readings from Last 3 Encounters:  05/07/20 230 lb 9.6 oz (104.6 kg)  05/05/20 230 lb 6.4 oz (104.5 kg)  03/27/20 209 lb 8 oz (95 kg)     Physical Exam Vitals reviewed.  Constitutional:      Appearance: He is well-developed.  HENT:     Head: Normocephalic and atraumatic.     Right Ear: External ear normal.     Left Ear: External ear normal.     Mouth/Throat:     Pharynx: No oropharyngeal exudate or posterior oropharyngeal erythema.  Eyes:     Pupils: Pupils are equal, round, and reactive to light.  Cardiovascular:     Rate and Rhythm: Normal rate and regular rhythm.     Heart sounds: No murmur heard.   Pulmonary:     Effort: No respiratory distress.     Breath sounds: Normal breath sounds.  Musculoskeletal:     Cervical back: Normal range of motion and neck supple.     Right lower leg: Edema present.     Left lower leg: Edema present.  Neurological:     Mental Status: He is alert and oriented to person, place, and time.    CXR- no significant change noted from last week.   Assessment & Plan:   [name] was seen today for follow-up.  Diagnoses and all orders for this visit:  Pneumonia due to COVID-19 virus -     DG Chest 2 View; Future -     CMP14+EGFR -     Pro b natriuretic peptide (BNP)  Seasonal allergic rhinitis due to pollen -     fluticasone (FLONASE) 50 MCG/ACT nasal spray; USE ONE SPRAY(S) IN EACH NOSTRIL ONCE DAILY -     CMP14+EGFR -     Pro b natriuretic peptide (BNP)  Essential hypertension -     CMP14+EGFR -     Pro b natriuretic peptide (BNP)  Stage 3b chronic kidney disease (HCC) -     CMP14+EGFR -     Pro b natriuretic peptide (BNP) -     BMP8+EGFR  Pneumonia of both lower lobes due to infectious organism -     Pro b natriuretic peptide (BNP) -     Discontinue:  levofloxacin (LEVAQUIN) 500 MG tablet; Take 1 tablet (500 mg total) by mouth daily. For 10 days  Systolic and diastolic CHF, acute ([place]) -     Pro b natriuretic peptide (BNP)  Other orders -     spironolactone (ALDACTONE) 25 MG tablet; Take 1 tablet (25 mg total) by mouth daily.       I have discontinued Truddie Crumble L. Delisle's levofloxacin. I am also having him start on spironolactone. Additionally, I am having him maintain his tacrolimus, sodium bicarbonate, mycophenolate, ReliOn Insulin Syringe, loratadine, fexofenadine-pseudoephedrine, insulin NPH Human, fluorouracil, blood glucose meter kit and supplies, Insulin Regular Human, zinc sulfate, ascorbic acid, albuterol, metoprolol tartrate, potassium chloride SA, predniSONE, furosemide, apixaban, ondansetron, ALPRAZolam, atorvastatin, and fluticasone.  Allergies as of 05/07/2020      Reactions   Other Rash   Use paper tape.    Tape Rash   Use paper tape.  Trazodone And Nefazodone Palpitations   tachycardia   Mirtazapine    imbalance   Elemental Sulfur Rash      Medication List       Accurate as of May 07, 2020  6:22 PM. If you have any questions, ask your nurse or doctor.        albuterol 108 (90 Base) MCG/ACT inhaler Commonly known as: VENTOLIN HFA Inhale 2 puffs into the lungs every 6 (six) hours as needed for wheezing or shortness of breath.   ALPRAZolam 0.25 MG tablet Commonly known as: XANAX Take 0.5 mg by mouth at bedtime as needed for anxiety.   apixaban 5 MG Tabs tablet Commonly known as: ELIQUIS Take 1 tablet (5 mg total) by mouth 2 (two) times daily.   ascorbic acid 500 MG tablet Commonly known as: VITAMIN C Take 1 tablet (500 mg total) by mouth daily.   atorvastatin 10 MG tablet Commonly known as: LIPITOR Take 1 tablet (10 mg total) by mouth daily.   blood glucose meter kit and supplies Dispense based on patient and insurance preference. Use up to four times daily as directed. (FOR ICD-10 E10.9,  E11.9).   fexofenadine-pseudoephedrine 180-240 MG 24 hr tablet Commonly known as: ALLEGRA-D 24 Take 1 tablet by mouth every evening. For allergy and congestion   fluorouracil 5 % cream Commonly known as: EFUDEX Apply 1 application topically daily.   fluticasone 50 MCG/ACT nasal spray Commonly known as: FLONASE USE ONE SPRAY(S) IN EACH NOSTRIL ONCE DAILY   furosemide 40 MG tablet Commonly known as: LASIX Take 1 tablet (40 mg total) by mouth daily.   insulin NPH Human 100 UNIT/ML injection Commonly known as: NOVOLIN N 30 units sq AC breakfast, 40 units sq ac supper   Insulin Regular Human 100 UNIT/ML Sopn Inject 12 units before lunch and 7 units before supper.   levofloxacin 500 MG tablet Commonly known as: LEVAQUIN Take 1 tablet (500 mg total) by mouth daily for 7 days. What changed: additional instructions Changed by: Claretta Fraise, MD   loratadine 10 MG tablet Commonly known as: CLARITIN Take 10 mg by mouth daily as needed for allergies.   metoprolol tartrate 50 MG tablet Commonly known as: LOPRESSOR Take 1 tablet (50 mg total) by mouth 2 (two) times daily.   mycophenolate 250 MG capsule Commonly known as: CELLCEPT TAKE 2 CAPSULES BY MOUTH TWO TIMES DAILY.   ondansetron 4 MG tablet Commonly known as: ZOFRAN Take 1 tablet (4 mg total) by mouth every 8 (eight) hours as needed for nausea or vomiting.   potassium chloride SA 20 MEQ tablet Commonly known as: KLOR-CON Take 1 tablet (20 mEq total) by mouth daily. For potassium replacement/ supplement   predniSONE 5 MG tablet Commonly known as: DELTASONE Take 5 mg by mouth daily with breakfast.   ReliOn Insulin Syringe 31G X 15/64" 0.5 ML Misc Generic drug: Insulin Syringe-Needle U-100 USE AS DIRECTED FOR INSULIN   sodium bicarbonate 650 MG tablet Take 650 mg by mouth 2 (two) times daily.   spironolactone 25 MG tablet Commonly known as: ALDACTONE Take 1 tablet (25 mg total) by mouth daily. Started by: Claretta Fraise, MD   tacrolimus 0.5 MG capsule Commonly known as: PROGRAF TAKE 2 CAPSULES BY MOUTH TWO TIMES DAILY.   zinc sulfate 220 (50 Zn) MG capsule Take 1 capsule (220 mg total) by mouth daily.        Follow-up: Return in about 1 week (around 05/14/2020).  Claretta Fraise, M.D.

## 2020-05-08 LAB — CMP14+EGFR
ALT: 14 IU/L (ref 0–44)
AST: 24 IU/L (ref 0–40)
Albumin/Globulin Ratio: 2.1 (ref 1.2–2.2)
Albumin: 3.4 g/dL — ABNORMAL LOW (ref 3.7–4.7)
Alkaline Phosphatase: 75 IU/L (ref 44–121)
BUN/Creatinine Ratio: 20 (ref 10–24)
BUN: 26 mg/dL (ref 8–27)
Bilirubin Total: 0.7 mg/dL (ref 0.0–1.2)
CO2: 23 mmol/L (ref 20–29)
Calcium: 8.1 mg/dL — ABNORMAL LOW (ref 8.6–10.2)
Chloride: 100 mmol/L (ref 96–106)
Creatinine, Ser: 1.32 mg/dL — ABNORMAL HIGH (ref 0.76–1.27)
Globulin, Total: 1.6 g/dL (ref 1.5–4.5)
Glucose: 169 mg/dL — ABNORMAL HIGH (ref 65–99)
Potassium: 4.3 mmol/L (ref 3.5–5.2)
Sodium: 138 mmol/L (ref 134–144)
Total Protein: 5 g/dL — ABNORMAL LOW (ref 6.0–8.5)
eGFR: 57 mL/min/{1.73_m2} — ABNORMAL LOW (ref >59)

## 2020-05-08 LAB — PRO B NATRIURETIC PEPTIDE: NT-Pro BNP: 40513 pg/mL — ABNORMAL HIGH (ref 0–376)

## 2020-05-13 ENCOUNTER — Other Ambulatory Visit: Payer: Self-pay

## 2020-05-13 ENCOUNTER — Ambulatory Visit (HOSPITAL_COMMUNITY)
Admission: RE | Admit: 2020-05-13 | Discharge: 2020-05-13 | Disposition: A | Discharge status: Home / Self Care | Payer: Medicare Other | Source: Ambulatory Visit | Attending: Family Medicine | Admitting: Family Medicine

## 2020-05-13 DIAGNOSIS — J189 Pneumonia, unspecified organism: Secondary | ICD-10-CM | POA: Insufficient documentation

## 2020-05-15 ENCOUNTER — Ambulatory Visit: Payer: Medicare Other | Admitting: Family Medicine

## 2020-05-16 ENCOUNTER — Telehealth: Payer: Medicare Other

## 2020-05-19 ENCOUNTER — Encounter: Payer: Self-pay | Admitting: Family Medicine

## 2020-05-19 ENCOUNTER — Other Ambulatory Visit: Payer: Self-pay | Admitting: Family Medicine

## 2020-05-19 MED ORDER — CANAGLIFLOZIN 300 MG PO TABS: 300.0000 mg | ORAL_TABLET | Freq: Every day | ORAL | 2 refills | Status: DC

## 2020-05-22 ENCOUNTER — Encounter: Payer: Self-pay | Admitting: Family Medicine

## 2020-05-22 ENCOUNTER — Ambulatory Visit (INDEPENDENT_AMBULATORY_CARE_PROVIDER_SITE_OTHER): Payer: Medicare Other | Admitting: Family Medicine

## 2020-05-22 ENCOUNTER — Ambulatory Visit (INDEPENDENT_AMBULATORY_CARE_PROVIDER_SITE_OTHER): Payer: Medicare Other

## 2020-05-22 ENCOUNTER — Other Ambulatory Visit: Payer: Self-pay

## 2020-05-22 VITALS — BP 127/82 | HR 105 | Temp 97.5°F

## 2020-05-22 DIAGNOSIS — U071 COVID-19: Secondary | ICD-10-CM

## 2020-05-22 DIAGNOSIS — J1282 Pneumonia due to coronavirus disease 2019: Secondary | ICD-10-CM

## 2020-05-22 DIAGNOSIS — I5041 Acute combined systolic (congestive) and diastolic (congestive) heart failure: Secondary | ICD-10-CM

## 2020-05-22 DIAGNOSIS — I6930 Unspecified sequelae of cerebral infarction: Secondary | ICD-10-CM

## 2020-05-22 DIAGNOSIS — Z940 Kidney transplant status: Secondary | ICD-10-CM | POA: Diagnosis not present

## 2020-05-22 NOTE — Progress Notes (Signed)
Subjective:  Patient ID: Daniel Myers, male    DOB: [birth year]  Age: 75 y.o. MRN: [id number]  CC: Follow-up (Pneumonia/)   HPI Daniel Myers presents for recheck of his shortness of breath and prolonged COVID symptoms with abnormal chest x-ray.  He is breathing a bit better this week.  His edema is down.  He is due for BMP as well as chest x-ray.  Depression screen [facility] 2/9 05/22/2020 05/07/2020 04/24/2020  Decreased Interest 0 0 0  Down, Depressed, Hopeless 0 0 0  PHQ - 2 Score 0 0 0    History Dontai has a past medical history of Acute respiratory failure with hypoxia ([place]) (02/12/2020), AKI (acute kidney injury) ([place]) (02/13/2020), Atrial fibrillation with RVR ([place]) (03/11/2020), Basal cell carcinoma (06/27/2013), Basal cell carcinoma (07/01/2014), Basal cell carcinoma (10/10/2014), Basal cell carcinoma (02/11/2015), Basal cell carcinoma (06/14/2016), Basal cell carcinoma (02/22/2017), Basal cell carcinoma (04/11/2018), Chronic kidney disease, History of renal transplant, Hyperlipidemia, Hypertension, NSTEMI (non-ST elevated myocardial infarction) ([place]) (02/13/2020), 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), 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), 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), Squamous cell carcinoma of skin (11/08/2011), Squamous cell carcinoma of skin (11/08/2011), Squamous cell carcinoma of skin (11/08/2011), 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), 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), 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), 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), 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), 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), 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), 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), Squamous cell carcinoma of skin (09/30/2015), Squamous cell carcinoma of skin (02/22/2017), Squamous cell carcinoma of skin (02/22/2017), Squamous cell carcinoma of skin (04/11/2018), Squamous cell carcinoma of  skin (04/11/2018), Squamous cell carcinoma of skin (04/11/2018), Squamous cell carcinoma of skin (04/11/2018), Squamous cell carcinoma of skin (04/11/2018), Squamous cell carcinoma of skin (09/28/2018), Squamous cell carcinoma of skin (09/28/2018), Squamous cell carcinoma of skin (09/28/2018), Squamous cell carcinoma of skin (03/21/2019), Squamous cell carcinoma of skin (03/21/2019), Squamous cell carcinoma of skin (03/21/2019), Type 2 diabetes mellitus ([place]), and Unspecified atrial fibrillation ([place]) (02/17/2020).   He has a past surgical history that includes Kidney transplant (Right).   His family history includes Clotting disorder in his mother; Diabetes in his sister; Hypertension in his sister.He reports that he has quit smoking. His smoking use included pipe. He has never used smokeless tobacco. He reports that he does not drink alcohol and does not use drugs.    ROS Review of Systems  Constitutional: Negative for fever.  Respiratory: Positive for shortness of breath (stable).   Cardiovascular: Negative for chest pain.  Musculoskeletal: Negative for arthralgias.  Skin: Negative for rash.    Objective:  BP 127/82   Pulse (!) 105   Temp (!) 97.5 F (36.4 C)   SpO2 96%   BP Readings from Last 3 Encounters:  05/22/20 127/82  05/07/20 (!) 109/57  05/05/20 118/70    Wt Readings from Last 3 Encounters:  05/07/20 230 lb 9.6 oz (104.6 kg)  05/05/20 230 lb 6.4 oz (104.5 kg)  03/27/20 209 lb 8 oz (95 kg)     Physical Exam Vitals reviewed.  Constitutional:      Appearance: He is well-developed.  HENT:     Head: Normocephalic and atraumatic.     Right Ear: External ear normal.     Left Ear: External ear normal.     Mouth/Throat:     Pharynx: No oropharyngeal exudate or posterior oropharyngeal erythema.  Eyes:     Pupils: Pupils  are equal, round, and reactive to light.  Cardiovascular:     Rate and Rhythm: Normal rate and regular rhythm.     Heart sounds: No murmur  heard.   Pulmonary:     Effort: No respiratory distress.     Breath sounds: Rales (few scattered) present.  Musculoskeletal:     Cervical back: Normal range of motion and neck supple.  Neurological:     Mental Status: He is alert and oriented to person, place, and time.     CXR- unhanged. REcent T reviewwed. Indicated improvement.  Assessment & Plan:   Suhan was seen today for follow-up.  Diagnoses and all orders for this visit:  Pneumonia due to COVID-19 virus -     DG Chest 2 View; Future  Renal transplant recipient -     RFF6+BWGY  Systolic and diastolic CHF, acute ([place]) -     BMP8+EGFR       I am having Jacquis L. Brownstein maintain his tacrolimus, sodium bicarbonate, mycophenolate, ReliOn Insulin Syringe, loratadine, fexofenadine-pseudoephedrine, insulin NPH Human, fluorouracil, blood glucose meter kit and supplies, Insulin Regular Human, zinc sulfate, ascorbic acid, albuterol, metoprolol tartrate, potassium chloride SA, predniSONE, furosemide, apixaban, ondansetron, ALPRAZolam, atorvastatin, fluticasone, spironolactone, and canagliflozin.  Allergies as of 05/22/2020      Reactions   Other Rash   Use paper tape.    Tape Rash   Use paper tape.    Trazodone And Nefazodone Palpitations   tachycardia   Mirtazapine    imbalance   Elemental Sulfur Rash      Medication List       Accurate as of May 22, 2020 11:59 PM. If you have any questions, ask your nurse or doctor.        albuterol 108 (90 Base) MCG/ACT inhaler Commonly known as: VENTOLIN HFA Inhale 2 puffs into the lungs every 6 (six) hours as needed for wheezing or shortness of breath.   ALPRAZolam 0.25 MG tablet Commonly known as: XANAX Take 0.5 mg by mouth at bedtime as needed for anxiety.   apixaban 5 MG Tabs tablet Commonly known as: ELIQUIS Take 1 tablet (5 mg total) by mouth 2 (two) times daily.   ascorbic acid 500 MG tablet Commonly known as: VITAMIN C Take 1 tablet (500 mg total) by  mouth daily.   atorvastatin 10 MG tablet Commonly known as: LIPITOR Take 1 tablet (10 mg total) by mouth daily.   blood glucose meter kit and supplies Dispense based on patient and insurance preference. Use up to four times daily as directed. (FOR ICD-10 E10.9, E11.9).   canagliflozin 300 MG Tabs tablet Commonly known as: Invokana Take 1 tablet (300 mg total) by mouth daily before breakfast.   fexofenadine-pseudoephedrine 180-240 MG 24 hr tablet Commonly known as: ALLEGRA-D 24 Take 1 tablet by mouth every evening. For allergy and congestion   fluorouracil 5 % cream Commonly known as: EFUDEX Apply 1 application topically daily.   fluticasone 50 MCG/ACT nasal spray Commonly known as: FLONASE USE ONE SPRAY(S) IN EACH NOSTRIL ONCE DAILY   furosemide 40 MG tablet Commonly known as: LASIX Take 1 tablet (40 mg total) by mouth daily.   insulin NPH Human 100 UNIT/ML injection Commonly known as: NOVOLIN N 30 units sq AC breakfast, 40 units sq ac supper   Insulin Regular Human 100 UNIT/ML Sopn Inject 12 units before lunch and 7 units before supper.   loratadine 10 MG tablet Commonly known as: CLARITIN Take 10 mg by mouth daily as needed  for allergies.   metoprolol tartrate 50 MG tablet Commonly known as: LOPRESSOR Take 1 tablet (50 mg total) by mouth 2 (two) times daily.   mycophenolate 250 MG capsule Commonly known as: CELLCEPT TAKE 2 CAPSULES BY MOUTH TWO TIMES DAILY.   ondansetron 4 MG tablet Commonly known as: ZOFRAN Take 1 tablet (4 mg total) by mouth every 8 (eight) hours as needed for nausea or vomiting.   potassium chloride SA 20 MEQ tablet Commonly known as: KLOR-CON Take 1 tablet (20 mEq total) by mouth daily. For potassium replacement/ supplement   predniSONE 5 MG tablet Commonly known as: DELTASONE Take 5 mg by mouth daily with breakfast.   ReliOn Insulin Syringe 31G X 15/64" 0.5 ML Misc Generic drug: Insulin Syringe-Needle U-100 USE AS DIRECTED FOR  INSULIN   sodium bicarbonate 650 MG tablet Take 650 mg by mouth 2 (two) times daily.   spironolactone 25 MG tablet Commonly known as: ALDACTONE Take 1 tablet (25 mg total) by mouth daily.   tacrolimus 0.5 MG capsule Commonly known as: PROGRAF TAKE 2 CAPSULES BY MOUTH TWO TIMES DAILY.   zinc sulfate 220 (50 Zn) MG capsule Take 1 capsule (220 mg total) by mouth daily.        Follow-up: Return in about 2 weeks (around 06/05/2020).  Claretta Fraise, M.D.

## 2020-05-23 LAB — BMP8+EGFR
BUN/Creatinine Ratio: 17 (ref 10–24)
BUN: 31 mg/dL — ABNORMAL HIGH (ref 8–27)
CO2: 22 mmol/L (ref 20–29)
Calcium: 8.6 mg/dL (ref 8.6–10.2)
Chloride: 94 mmol/L — ABNORMAL LOW (ref 96–106)
Creatinine, Ser: 1.86 mg/dL — ABNORMAL HIGH (ref 0.76–1.27)
Glucose: 213 mg/dL — ABNORMAL HIGH (ref 65–99)
Potassium: 4.8 mmol/L (ref 3.5–5.2)
Sodium: 132 mmol/L — ABNORMAL LOW (ref 134–144)
eGFR: 38 mL/min/{1.73_m2} — ABNORMAL LOW (ref >59)

## 2020-06-02 ENCOUNTER — Ambulatory Visit (INDEPENDENT_AMBULATORY_CARE_PROVIDER_SITE_OTHER): Payer: Medicare Other

## 2020-06-02 ENCOUNTER — Other Ambulatory Visit: Payer: Self-pay

## 2020-06-02 DIAGNOSIS — Z4800 Encounter for change or removal of nonsurgical wound dressing: Secondary | ICD-10-CM

## 2020-06-02 DIAGNOSIS — R238 Other skin changes: Secondary | ICD-10-CM

## 2020-06-02 DIAGNOSIS — I130 Hypertensive heart and chronic kidney disease with heart failure and stage 1 through stage 4 chronic kidney disease, or unspecified chronic kidney disease: Secondary | ICD-10-CM

## 2020-06-02 DIAGNOSIS — Z7952 Long term (current) use of systemic steroids: Secondary | ICD-10-CM

## 2020-06-02 DIAGNOSIS — J1282 Pneumonia due to coronavirus disease 2019: Secondary | ICD-10-CM | POA: Diagnosis not present

## 2020-06-02 DIAGNOSIS — Z792 Long term (current) use of antibiotics: Secondary | ICD-10-CM

## 2020-06-02 DIAGNOSIS — N189 Chronic kidney disease, unspecified: Secondary | ICD-10-CM

## 2020-06-02 DIAGNOSIS — I4891 Unspecified atrial fibrillation: Secondary | ICD-10-CM

## 2020-06-02 DIAGNOSIS — Z7951 Long term (current) use of inhaled steroids: Secondary | ICD-10-CM

## 2020-06-02 DIAGNOSIS — E1122 Type 2 diabetes mellitus with diabetic chronic kidney disease: Secondary | ICD-10-CM

## 2020-06-02 DIAGNOSIS — I252 Old myocardial infarction: Secondary | ICD-10-CM

## 2020-06-02 DIAGNOSIS — Z7901 Long term (current) use of anticoagulants: Secondary | ICD-10-CM

## 2020-06-02 DIAGNOSIS — Z794 Long term (current) use of insulin: Secondary | ICD-10-CM

## 2020-06-02 DIAGNOSIS — Z8616 Personal history of COVID-19: Secondary | ICD-10-CM

## 2020-06-02 DIAGNOSIS — G8194 Hemiplegia, unspecified affecting left nondominant side: Secondary | ICD-10-CM

## 2020-06-02 DIAGNOSIS — E785 Hyperlipidemia, unspecified: Secondary | ICD-10-CM

## 2020-06-02 DIAGNOSIS — I5041 Acute combined systolic (congestive) and diastolic (congestive) heart failure: Secondary | ICD-10-CM

## 2020-06-04 ENCOUNTER — Ambulatory Visit (INDEPENDENT_AMBULATORY_CARE_PROVIDER_SITE_OTHER): Payer: Medicare Other | Admitting: Family Medicine

## 2020-06-04 ENCOUNTER — Other Ambulatory Visit: Payer: Self-pay

## 2020-06-04 ENCOUNTER — Encounter: Payer: Self-pay | Admitting: Family Medicine

## 2020-06-04 VITALS — BP 131/81 | HR 100 | Temp 97.1°F | Ht 70.0 in | Wt 207.8 lb

## 2020-06-04 DIAGNOSIS — I2510 Atherosclerotic heart disease of native coronary artery without angina pectoris: Secondary | ICD-10-CM | POA: Diagnosis not present

## 2020-06-04 DIAGNOSIS — U071 COVID-19: Secondary | ICD-10-CM

## 2020-06-04 DIAGNOSIS — I5041 Acute combined systolic (congestive) and diastolic (congestive) heart failure: Secondary | ICD-10-CM

## 2020-06-04 DIAGNOSIS — Z940 Kidney transplant status: Secondary | ICD-10-CM

## 2020-06-04 DIAGNOSIS — I739 Peripheral vascular disease, unspecified: Secondary | ICD-10-CM | POA: Diagnosis not present

## 2020-06-04 DIAGNOSIS — J1282 Pneumonia due to coronavirus disease 2019: Secondary | ICD-10-CM | POA: Diagnosis not present

## 2020-06-04 NOTE — Progress Notes (Signed)
Subjective:  Patient ID: Daniel Myers, male    DOB: 1945/07/02  Age: 75 y.o. MRN: [id number]  CC: Pneumonia   HPI [name] presents for recheck of bilateral edema and his post COVID-pneumonia.  Today his caregivers are very pleased that the swelling is much much better.  They said the last thing that he was given worked wonders.  Mr. Lajoyce Corners himself says his breathing is much better he is still in a wheelchair but feeling fine without cough or dyspnea  Depression screen [facility] 2/9 06/04/2020 05/22/2020 05/07/2020  Decreased Interest 0 0 0  Down, Depressed, Hopeless 0 0 0  PHQ - 2 Score 0 0 0    History Nicholas has a past medical history of Acute respiratory failure with hypoxia ([place]) (02/12/2020), AKI (acute kidney injury) ([place]) (02/13/2020), Atrial fibrillation with RVR ([place]) (03/11/2020), Basal cell carcinoma (06/27/2013), Basal cell carcinoma (07/01/2014), Basal cell carcinoma (10/10/2014), Basal cell carcinoma (02/11/2015), Basal cell carcinoma (06/14/2016), Basal cell carcinoma (02/22/2017), Basal cell carcinoma (04/11/2018), Chronic kidney disease, History of renal transplant, Hyperlipidemia, Hypertension, NSTEMI (non-ST elevated myocardial infarction) ([place]) (02/13/2020), 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), 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), 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), Squamous cell carcinoma of skin (11/08/2011), Squamous cell carcinoma of skin (11/08/2011), Squamous cell carcinoma of skin (11/08/2011), 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), 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), 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), 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), 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), 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), 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), 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), Squamous cell carcinoma of skin (09/30/2015), Squamous cell carcinoma of skin (02/22/2017), Squamous  cell carcinoma of skin (02/22/2017), Squamous cell carcinoma of skin (04/11/2018), Squamous cell carcinoma of skin (04/11/2018), Squamous cell carcinoma of skin (04/11/2018), Squamous cell carcinoma of skin (04/11/2018), Squamous cell carcinoma of skin (04/11/2018), Squamous cell carcinoma of skin (09/28/2018), Squamous cell carcinoma of skin (09/28/2018), Squamous cell carcinoma of skin (09/28/2018), Squamous cell carcinoma of skin (03/21/2019), Squamous cell carcinoma of skin (03/21/2019), Squamous cell carcinoma of skin (03/21/2019), Type 2 diabetes mellitus ([place]), and Unspecified atrial fibrillation ([place]) (02/17/2020).   He has a past surgical history that includes Kidney transplant (Right).   His family history includes Clotting disorder in his mother; Diabetes in his sister; Hypertension in his sister.He reports that he has quit smoking. His smoking use included pipe. He has never used smokeless tobacco. He reports that he does not drink alcohol and does not use drugs.    ROS Review of Systems  Constitutional: Negative for fever.  Respiratory: Negative for shortness of breath.   Cardiovascular: Negative for chest pain.  Musculoskeletal: Negative for arthralgias.  Skin: Negative for rash.    Objective:  BP 131/81   Pulse 100   Temp (!) 97.1 F (36.2 C)   Ht '5\' 10"'  (1.778 m)   Wt 207 lb 12.8 oz (94.3 kg)   SpO2 97%   BMI 29.82 kg/m   BP Readings from Last 3 Encounters:  06/06/20 118/72  06/04/20 131/81  05/22/20 127/82    Wt Readings from Last 3 Encounters:  06/06/20 207 lb 3.2 oz (94 kg)  06/04/20 207 lb 12.8 oz (94.3 kg)  05/07/20 230 lb 9.6 oz (104.6 kg)     Physical Exam Skin:    Findings: Lesion (dusky appearance of distal right foot.) present.         Assessment & Plan:   Morad was seen today for  pneumonia.  Diagnoses and all orders for this visit:  Pneumonia due to COVID-19 virus -     CBC with Differential/Platelet -     CMP14+EGFR  Renal  transplant recipient -     CBC with Differential/Platelet -     SJG28+ZMOQ  Systolic and diastolic CHF, acute (HCC) -     CBC with Differential/Platelet -     CMP14+EGFR  PAD (peripheral artery disease) ([place]) -     CBC with Differential/Platelet -     CMP14+EGFR -     Ambulatory referral to Vascular Surgery       I am having Karell L. Breidenbach maintain his tacrolimus, sodium bicarbonate, mycophenolate, ReliOn Insulin Syringe, loratadine, fexofenadine-pseudoephedrine, insulin NPH Human, fluorouracil, blood glucose meter kit and supplies, Insulin Regular Human, zinc sulfate, ascorbic acid, albuterol, metoprolol tartrate, potassium chloride SA, predniSONE, furosemide, apixaban, ondansetron, ALPRAZolam, atorvastatin, fluticasone, spironolactone, and canagliflozin.  Allergies as of 06/04/2020      Reactions   Other Rash   Use paper tape.    Tape Rash   Use paper tape.    Trazodone And Nefazodone Palpitations   tachycardia   Mirtazapine    imbalance   Elemental Sulfur Rash      Medication List       Accurate as of June 04, 2020 11:59 PM. If you have any questions, ask your nurse or doctor.        albuterol 108 (90 Base) MCG/ACT inhaler Commonly known as: VENTOLIN HFA Inhale 2 puffs into the lungs every 6 (six) hours as needed for wheezing or shortness of breath.   ALPRAZolam 0.25 MG tablet Commonly known as: XANAX Take 0.5 mg by mouth at bedtime as needed for anxiety.   apixaban 5 MG Tabs tablet Commonly known as: ELIQUIS Take 1 tablet (5 mg total) by mouth 2 (two) times daily.   ascorbic acid 500 MG tablet Commonly known as: VITAMIN C Take 1 tablet (500 mg total) by mouth daily.   atorvastatin 10 MG tablet Commonly known as: LIPITOR Take 1 tablet (10 mg total) by mouth daily.   blood glucose meter kit and supplies Dispense based on patient and insurance preference. Use up to four times daily as directed. (FOR ICD-10 E10.9, E11.9).   canagliflozin 300 MG Tabs  tablet Commonly known as: Invokana Take 1 tablet (300 mg total) by mouth daily before breakfast.   fexofenadine-pseudoephedrine 180-240 MG 24 hr tablet Commonly known as: ALLEGRA-D 24 Take 1 tablet by mouth every evening. For allergy and congestion   fluorouracil 5 % cream Commonly known as: EFUDEX Apply 1 application topically daily.   fluticasone 50 MCG/ACT nasal spray Commonly known as: FLONASE USE ONE SPRAY(S) IN EACH NOSTRIL ONCE DAILY   furosemide 40 MG tablet Commonly known as: LASIX Take 1 tablet (40 mg total) by mouth daily.   insulin NPH Human 100 UNIT/ML injection Commonly known as: NOVOLIN N 30 units sq AC breakfast, 40 units sq ac supper   Insulin Regular Human 100 UNIT/ML Sopn Inject 12 units before lunch and 7 units before supper.   loratadine 10 MG tablet Commonly known as: CLARITIN Take 10 mg by mouth daily as needed for allergies.   metoprolol tartrate 50 MG tablet Commonly known as: LOPRESSOR Take 1 tablet (50 mg total) by mouth 2 (two) times daily.   mycophenolate 250 MG capsule Commonly known as: CELLCEPT TAKE 2 CAPSULES BY MOUTH TWO TIMES DAILY.   ondansetron 4 MG tablet Commonly known as: ZOFRAN Take 1 tablet (  4 mg total) by mouth every 8 (eight) hours as needed for nausea or vomiting.   potassium chloride SA 20 MEQ tablet Commonly known as: KLOR-CON Take 1 tablet (20 mEq total) by mouth daily. For potassium replacement/ supplement   predniSONE 5 MG tablet Commonly known as: DELTASONE Take 5 mg by mouth daily with breakfast.   ReliOn Insulin Syringe 31G X 15/64" 0.5 ML Misc Generic drug: Insulin Syringe-Needle U-100 USE AS DIRECTED FOR INSULIN   sodium bicarbonate 650 MG tablet Take 650 mg by mouth 2 (two) times daily.   spironolactone 25 MG tablet Commonly known as: ALDACTONE Take 1 tablet (25 mg total) by mouth daily.   tacrolimus 0.5 MG capsule Commonly known as: PROGRAF TAKE 2 CAPSULES BY MOUTH TWO TIMES DAILY.   zinc  sulfate 220 (50 Zn) MG capsule Take 1 capsule (220 mg total) by mouth daily.        Follow-up: Return in about 3 weeks (around 06/25/2020).  Claretta Fraise, M.D.

## 2020-06-05 ENCOUNTER — Ambulatory Visit (INDEPENDENT_AMBULATORY_CARE_PROVIDER_SITE_OTHER): Payer: Medicare Other

## 2020-06-05 DIAGNOSIS — I214 Non-ST elevation (NSTEMI) myocardial infarction: Secondary | ICD-10-CM

## 2020-06-05 DIAGNOSIS — R931 Abnormal findings on diagnostic imaging of heart and coronary circulation: Secondary | ICD-10-CM | POA: Diagnosis not present

## 2020-06-05 LAB — CBC WITH DIFFERENTIAL/PLATELET
Basophils Absolute: 0 10*3/uL (ref 0.0–0.2)
Basos: 0 %
EOS (ABSOLUTE): 0.1 10*3/uL (ref 0.0–0.4)
Eos: 2 %
Hematocrit: 42.9 % (ref 37.5–51.0)
Hemoglobin: 13.3 g/dL (ref 13.0–17.7)
Immature Grans (Abs): 0 10*3/uL (ref 0.0–0.1)
Immature Granulocytes: 1 %
Lymphocytes Absolute: 1.3 10*3/uL (ref 0.7–3.1)
Lymphs: 29 %
MCH: 27.3 pg (ref 26.6–33.0)
MCHC: 31 g/dL — ABNORMAL LOW (ref 31.5–35.7)
MCV: 88 fL (ref 79–97)
Monocytes Absolute: 0.2 10*3/uL (ref 0.1–0.9)
Monocytes: 5 %
Neutrophils Absolute: 2.7 10*3/uL (ref 1.4–7.0)
Neutrophils: 63 %
Platelets: 79 10*3/uL — CL (ref 150–450)
RBC: 4.88 x10E6/uL (ref 4.14–5.80)
RDW: 13.4 % (ref 11.6–15.4)
WBC: 4.3 10*3/uL (ref 3.4–10.8)

## 2020-06-05 LAB — ECHOCARDIOGRAM LIMITED
AV Vena cont: 0.3 cm
Area-P 1/2: 5.42 cm2
Calc EF: 25.1 %
MV M vel: 3.78 m/s
MV Peak grad: 57 mmHg
P 1/2 time: 2246 msec
Radius: 0.4 cm
S' Lateral: 5.75 cm
Single Plane A2C EF: 24.2 %
Single Plane A4C EF: 25.1 %

## 2020-06-05 LAB — CMP14+EGFR
ALT: 13 IU/L (ref 0–44)
AST: 20 IU/L (ref 0–40)
Albumin/Globulin Ratio: 2 (ref 1.2–2.2)
Albumin: 4 g/dL (ref 3.7–4.7)
Alkaline Phosphatase: 77 IU/L (ref 44–121)
BUN/Creatinine Ratio: 13 (ref 10–24)
BUN: 22 mg/dL (ref 8–27)
Bilirubin Total: 0.7 mg/dL (ref 0.0–1.2)
CO2: 23 mmol/L (ref 20–29)
Calcium: 8.7 mg/dL (ref 8.6–10.2)
Chloride: 97 mmol/L (ref 96–106)
Creatinine, Ser: 1.64 mg/dL — ABNORMAL HIGH (ref 0.76–1.27)
Globulin, Total: 2 g/dL (ref 1.5–4.5)
Glucose: 195 mg/dL — ABNORMAL HIGH (ref 65–99)
Potassium: 4.8 mmol/L (ref 3.5–5.2)
Sodium: 138 mmol/L (ref 134–144)
Total Protein: 6 g/dL (ref 6.0–8.5)
eGFR: 44 mL/min/{1.73_m2} — ABNORMAL LOW (ref >59)

## 2020-06-05 NOTE — Progress Notes (Signed)
Cardiology Office Note  Date: 06/06/2020   ID: Daniel Myers [birth year], MRN [id number]  PCP:  Claretta Fraise, MD  Cardiologist:  Rozann Lesches, MD Electrophysiologist:  None   Chief Complaint:  Cardiac follow up  History of Present Illness: Daniel Myers is a 75 y.o. male with a history of NSTEMI, Atrial fibrillation, AKI on CKD, HTN, DM2, HLD, Covid pneumonia, acute respiratory failure with hypoxia, hyponatremia, renal transplant recipient (right), CKD, Thrombocytopenia, Systolic and diastolic HF.   Presented 02/11/2020 AP with SOB, CP, respiratory failure secondary to Covid pneumonia. Positive for NSTEMI and transferred to [facility].  Troponins were; [phone].  He developed volume overload and hyponatremia. Responded well to diuresis and hyponatremia was eventually corrected. He was continuing BB and ASA. Not on ACEI or ARBs d/t renal disease. Ischemic workup being contemplated by cardiology as an outpatient. Hypoxia resolved with tx of Covid and CHF. Echo showed mild LV dysfunction and segmental wall motion abnormalities. Daughter was to arrange follow up with nephrology. He was started on Apixaban and BB was increased for HR control. CHA2DS2-Vasc = 5. Cardiology recommended functional testing after recovery from Covid.  Platelets were 100 recent blood draw at PCP office on 02/29/2020.   He had a recent abnormal stress test which was forwarded to Dr. Domenic Polite for review.  His recommendation was to have follow-up limited echocardiogram to reassess LVEF and ensure no actual decline. Dr Domenic Polite stated he would be high risk for contrast nephropathy were we to pursue a cardiac catheterization at that time, and medical therapy made the most sense as long as he was clinically stable. If LVEF had actually declined, this may need to be reconsidered.   He had recent follow up echocardiogram 06/05/2020 : EF of 25 to 30% by estimation.  LV global hypokinesis.  LV internal cavity size  is moderately dilated.  Mild MR, mild TR. he is here today with his daughters.  He states he feels reasonably well.  States he has lost some weight since starting Invokana.  He is compliant with all of his medications.  He denies any anginal or exertional symptoms, shortness of breath, DOE, palpitations or arrhythmias, orthostatic symptoms, CVA or TIA-like symptoms, PND, orthopnea, bleeding issues.  Denies any claudication-like symptoms, DVT or PE-like symptoms, lower extremity edema.  Blood pressure is 118/72 with heart rate of 90.  We discussed the results of the stress test and possible neck steps.  I spoke with he and his daughters regarding possible next steps due to decrease in EF on echocardiogram.  Will forward my note to Dr. Domenic Polite.     Past Medical History:  Diagnosis Date  . Acute respiratory failure with hypoxia ([place]) 02/12/2020  . AKI (acute kidney injury) ([place]) 02/13/2020  . Atrial fibrillation with RVR ([place]) 03/11/2020  . Basal cell carcinoma 06/27/2013   nodular on left jawline - excision  . Basal cell carcinoma 07/01/2014   nodular on left hawling - CX3+5FU+excision  . Basal cell carcinoma 10/10/2014   nodular on right forearm, middle - tx p bx  . Basal cell carcinoma 02/11/2015   left neck - CX3 + excision  . Basal cell carcinoma 06/14/2016   left jawline - CX3+5FU  . Basal cell carcinoma 02/22/2017   superficial and nodular on left neck - excision  . Basal cell carcinoma 04/11/2018   superficial and nodular on right inferior forearm - CX3+Cautery+5FU  . Chronic kidney disease   . History of renal transplant   . Hyperlipidemia   .  Hypertension   . NSTEMI (non-ST elevated myocardial infarction) ([place]) 02/13/2020  . 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 Squamous cell  carcinoma of skin 11/08/2011   right upper forearm - tx p bx  . Squamous cell carcinoma of skin 11/08/2011   left upper forearm - tx p bx  . Squamous cell carcinoma of skin 11/08/2011   left hand - tx p bx  . 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/27/2013   in situ on left lower back - CX3+5FU  . 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/27/2013   in situ on left forehead - CX3+5FU  . 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/27/2013   in situ on right temple - watch per ST  . 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/27/2013   in situ on right forearm - CX3+5FU  . Squamous cell carcinoma of skin 07/01/2014   well differentiated on right sideburn - CX3+5FU  . Squamous cell carcinoma of skin 07/01/2014   in situ on left shoulder - CX3+5FU  . Squamous cell carcinoma of skin 07/01/2014   in situ on right forearm, proximal - CX3+5FU+Cautery  . Squamous cell carcinoma of skin 07/01/2014   in situ on right forearm, distal - CX3+5FU  . Squamous cell carcinoma of skin 09/30/2015   in situ on posterior left ear - CX3+5FU  . Squamous cell carcinoma of skin 02/22/2017   in situ on right upper arm - CX3+5FU  . Squamous cell carcinoma of skin 02/22/2017   in situ on left upper arm - CX3+5FU  . Squamous cell carcinoma of skin 04/11/2018   in situ on right temple - CX3+5FU  . Squamous cell carcinoma of skin 04/11/2018   in situ on lateral right arm - MOHs  . Squamous cell carcinoma of skin 04/11/2018   in situ on right upper arm - MOHs  . Squamous cell carcinoma of skin 04/11/2018   in situ on left sideburn  . Squamous cell carcinoma of skin 04/11/2018   in situ on right flank - tx p bx  . Squamous cell carcinoma of skin 09/28/2018   in situ on right inner ear - tx p bx  . Squamous cell carcinoma of skin 09/28/2018   in situ on left inner ear - tx p bx  . Squamous cell carcinoma of skin 09/28/2018   in situ on right arm - tx p bx  . Squamous cell carcinoma of skin 03/21/2019   in situ on right antihelix ([place]  treated topically)  . Squamous cell carcinoma of skin 03/21/2019   in situ on right neck - CX3+5FU  . Squamous cell carcinoma of skin 03/21/2019   in situ on left outer eye, inf (MOHs done 05/23/2019)  . Type 2 diabetes mellitus ([place])   . Unspecified atrial fibrillation ([place]) 02/17/2020    Past Surgical History:  Procedure Laterality Date  . KIDNEY TRANSPLANT Right     Current Outpatient Medications  Medication Sig Dispense Refill  . albuterol (VENTOLIN HFA) 108 (90 Base) MCG/ACT inhaler Inhale 2 puffs into the lungs every 6 (six) hours as needed for wheezing or shortness of breath. 1 each 3  . ALPRAZolam (XANAX) 0.25 MG tablet Take 0.5 mg by mouth at bedtime as needed for anxiety.    Marland Kitchen  apixaban (ELIQUIS) 5 MG TABS tablet Take 1 tablet (5 mg total) by mouth 2 (two) times daily. 60 tablet 2  . ascorbic acid (VITAMIN C) 500 MG tablet Take 1 tablet (500 mg total) by mouth daily.    Marland Kitchen atorvastatin (LIPITOR) 10 MG tablet Take 1 tablet (10 mg total) by mouth daily. 90 tablet 1  . blood glucose meter kit and supplies Dispense based on patient and insurance preference. Use up to four times daily as directed. (FOR ICD-10 E10.9, E11.9). 1 each 0  . canagliflozin (INVOKANA) 300 MG TABS tablet Take 1 tablet (300 mg total) by mouth daily before breakfast. 30 tablet 2  . fexofenadine-pseudoephedrine (ALLEGRA-D 24) 180-240 MG 24 hr tablet Take 1 tablet by mouth every evening. For allergy and congestion 30 tablet 11  . fluorouracil (EFUDEX) 5 % cream Apply 1 application topically daily.    . fluticasone (FLONASE) 50 MCG/ACT nasal spray USE ONE SPRAY(S) IN EACH NOSTRIL ONCE DAILY 16 g 11  . furosemide (LASIX) 40 MG tablet Take 1 tablet (40 mg total) by mouth daily. 30 tablet 1  . insulin NPH Human (NOVOLIN N) 100 UNIT/ML injection 30 units sq AC breakfast, 40 units sq ac supper 30 mL 11  . Insulin Regular Human 100 UNIT/ML SOPN Inject 12 units before lunch and 7 units before supper. 10 mL prn  . loratadine  (CLARITIN) 10 MG tablet Take 10 mg by mouth daily as needed for allergies.    . metoprolol tartrate (LOPRESSOR) 50 MG tablet Take 1 tablet (50 mg total) by mouth 2 (two) times daily. 60 tablet 2  . mycophenolate (CELLCEPT) 250 MG capsule TAKE 2 CAPSULES BY MOUTH TWO TIMES DAILY.    Marland Kitchen ondansetron (ZOFRAN) 4 MG tablet Take 1 tablet (4 mg total) by mouth every 8 (eight) hours as needed for nausea or vomiting. 20 tablet 0  . potassium chloride SA (KLOR-CON) 20 MEQ tablet Take 1 tablet (20 mEq total) by mouth daily. For potassium replacement/ supplement 30 tablet 5  . predniSONE (DELTASONE) 5 MG tablet Take 5 mg by mouth daily with breakfast.    . RELION INSULIN SYRINGE 31G X 15/64" 0.5 ML MISC USE AS DIRECTED FOR INSULIN    . sodium bicarbonate 650 MG tablet Take 650 mg by mouth 2 (two) times daily.    Marland Kitchen spironolactone (ALDACTONE) 25 MG tablet Take 1 tablet (25 mg total) by mouth daily. 90 tablet 3  . tacrolimus (PROGRAF) 0.5 MG capsule TAKE 2 CAPSULES BY MOUTH TWO TIMES DAILY.    Marland Kitchen zinc sulfate 220 (50 Zn) MG capsule Take 1 capsule (220 mg total) by mouth daily.     No current facility-administered medications for this visit.   Allergies:  Other, Tape, Trazodone and nefazodone, Mirtazapine, and Elemental sulfur   Social History: The patient  reports that he has quit smoking. His smoking use included pipe. He has never used smokeless tobacco. He reports that he does not drink alcohol and does not use drugs.   Family History: The patient's family history includes Clotting disorder in his mother; Diabetes in his sister; Hypertension in his sister.   ROS:  Please see the history of present illness. Otherwise, complete review of systems is positive for none.  All other systems are reviewed and negative.   Physical Exam: VS:  BP 118/72   Pulse 90   Ht 5' 10" (1.778 m)   Wt 207 lb 3.2 oz (94 kg)   SpO2 96%   BMI 29.73 kg/m ,  BMI Body mass index is 29.73 kg/m.  Wt Readings from Last 3  Encounters:  06/06/20 207 lb 3.2 oz (94 kg)  06/04/20 207 lb 12.8 oz (94.3 kg)  05/07/20 230 lb 9.6 oz (104.6 kg)    General: Patient appears comfortable at rest. Neck: Supple, no elevated JVP or carotid bruits, no thyromegaly. Lungs: Clear to auscultation, nonlabored breathing at rest. Cardiac: Irregularly irregular rate and rhythm, no S3 or significant systolic murmur, no pericardial rub. Extremities: Mild pitting edema, distal pulses 2+. Skin: Warm and dry. Musculoskeletal: No kyphosis. Neuropsychiatric: Alert and oriented x3, affect grossly appropriate.  ECG: May 05, 2020 EKG atrial fibrillation with premature ventricular or aberrantly conducted complexes rate of 100.  Inferior infarct, age undetermined.  Anterolateral infarct, age undetermined.  Recent Labwork: 02/11/2020: B Natriuretic Peptide 842.0 02/17/2020: Magnesium 2.4 05/07/2020: NT-Pro BNP 40,513 06/04/2020: ALT 13; AST 20; BUN 22; Creatinine, Ser 1.64; Hemoglobin 13.3; Platelets 79; Potassium 4.8; Sodium 138     Component Value Date/Time   CHOL 119 04/03/2020 1027   TRIG 211 (H) 04/03/2020 1027   HDL 23 (L) 04/03/2020 1027   CHOLHDL 5.2 (H) 04/03/2020 1027   LDLCALC 61 04/03/2020 1027    Other Studies Reviewed Today:  Echocardiogram 06/05/2020  1. Left ventricular ejection fraction, by estimation, is 25 to 30%. The left ventricle has severely decreased function. The left ventricle demonstrates global hypokinesis. The left ventricular internal cavity size was moderately dilated. Left ventricular diastolic parameters are indeterminate. 2. Right ventricular systolic function is mildly reduced. The right ventricular size is mildly enlarged. 3. Mild mitral valve regurgitation. No evidence of mitral stenosis. 4. The tricuspid valve is abnormal. Tricuspid valve regurgitation is mild. 5. The aortic valve has an indeterminant number of cusps. Aortic valve regurgitation is not visualized. No aortic stenosis is present. 6.  Limited echo Comparison(s): Echocardiogram done 02/12/20 showed an EF of 40-45%.   Stress Myoview 05/02/2020   Narrative & Impression   Lexiscan stress is electrically negative for ischemia  Myovue scan shows large fixed defect in the inferior and inferolateral walls (base, mid, distal); distal anterior wall and apex consistent with scar and some possible soft tissue attenuation; no ischemia.  LVEF calculated at 21% with diffuse hypokinesis  Overall high risk scan      Echocardiogram 02/12/2020 1. Left ventricular ejection fraction, by estimation, is 40 to 45%. The left ventricle has mildly decreased function. The left ventricle demonstrates regional wall motion abnormalities (see scoring diagram/findings for description). The left ventricular internal cavity size was mildly dilated. There is mild left ventricular hypertrophy. Left ventricular diastolic parameters are suggestive of Grade III diastolic dysfunction (restrictive). 2. Right ventricular systolic function is normal. The right ventricular size is normal. Tricuspid regurgitation signal is inadequate for assessing PA pressure. 3. Left atrial size was severely dilated. 4. The mitral valve is grossly normal, mildly calcified. Trivial mitral valve regurgitation. 5. The aortic valve is tricuspid. Aortic valve regurgitation is not visualized. 6. The inferior vena cava is dilated in size with >50% respiratory variability, suggesting right atrial pressure of 8 mmHg.  Assessment and Plan:  1. CAD in native artery   2. Atrial fibrillation, unspecified type ([place])   3. Chronic combined systolic (congestive) and diastolic (congestive) heart failure (HCC)   4. Stage 3 chronic kidney disease, unspecified whether stage 3a or 3b CKD ([place])   5. Hyponatremia   6. Chest pain, unspecified type   7. PAD (peripheral artery disease) ([place])    1. CAD S/P NSTEMI  Recent hospital admission with NSTEMI with elevated troponins and concomitant Covid  pneumonia.  Cardiology was consulted and recommended outpatient ischemia work-up after recovering from Covid pneumonia.  Had a subsequent abnormal stress test and recent follow-up echo with reduced EF.  See report above.   2. Atrial fibrillation, unspecified type ([place]) He remains in atrial fibrillation today with current rate of 90. Continue metoprolol 50 mg mg p.o. twice daily.  Continue Eliquis 5 mg p.o. twice daily.  3. Chronic combined systolic (congestive) and diastolic (congestive) heart failure (HCC) Recent follow-up echocardiogram.06/05/2020 : EF of 25 to 30% by estimation.  LV global hypokinesis.  LV internal cavity size is moderately dilated.  Mild MR, mild TR. I will forward note to Dr. Domenic Polite for evaluation to determine next steps.  Patient states he is feeling better.  Today's weight is 207 he states he has lost some weight since starting Invokana.  Continue Lasix 40 mg daily.  Potassium supplementation daily.  Spironolactone 25 mg daily.   4. Stage 3 chronic kidney disease, unspecified whether stage 3a or 3b CKD ([employer]) Recent CMP on 06/04/2020 demonstrated creatinine of 1.64 and GFR of 44.Marland Kitchen  Continue Lasix 40 mg milligrams daily.   5. Hyponatremia Recent chemistry profile on 06/04/2020 showed resolution of hyponatremia with sodium of 138.    Medication Adjustments/Labs and Tests Ordered: Current medicines are reviewed at length with the patient today.  Concerns regarding medicines are outlined above.   Disposition: Follow-up with Dr. Domenic Polite or APP 3 months Signed, Levell July, NP 06/06/2020 11:35 AM    [facility] [place], [place], [place] [address] Phone: [phone]; Fax: [phone]

## 2020-06-05 NOTE — Progress Notes (Signed)
Hello Michal,  Your lab result is normal and/or stable.Some minor variations that are not significant are commonly marked abnormal, but do not represent any medical problem for you.  Best regards, Odella Appelhans, M.D.

## 2020-06-06 ENCOUNTER — Encounter: Payer: Self-pay | Admitting: Family Medicine

## 2020-06-06 ENCOUNTER — Ambulatory Visit (INDEPENDENT_AMBULATORY_CARE_PROVIDER_SITE_OTHER): Payer: Medicare Other | Admitting: Family Medicine

## 2020-06-06 ENCOUNTER — Other Ambulatory Visit: Payer: Self-pay

## 2020-06-06 VITALS — BP 118/72 | HR 90 | Ht 70.0 in | Wt 207.2 lb

## 2020-06-06 DIAGNOSIS — I2510 Atherosclerotic heart disease of native coronary artery without angina pectoris: Secondary | ICD-10-CM | POA: Diagnosis not present

## 2020-06-06 DIAGNOSIS — I214 Non-ST elevation (NSTEMI) myocardial infarction: Secondary | ICD-10-CM

## 2020-06-06 DIAGNOSIS — N183 Chronic kidney disease, stage 3 unspecified: Secondary | ICD-10-CM

## 2020-06-06 DIAGNOSIS — I4891 Unspecified atrial fibrillation: Secondary | ICD-10-CM | POA: Diagnosis not present

## 2020-06-06 DIAGNOSIS — E871 Hypo-osmolality and hyponatremia: Secondary | ICD-10-CM

## 2020-06-06 DIAGNOSIS — I5042 Chronic combined systolic (congestive) and diastolic (congestive) heart failure: Secondary | ICD-10-CM | POA: Diagnosis not present

## 2020-06-06 DIAGNOSIS — R079 Chest pain, unspecified: Secondary | ICD-10-CM

## 2020-06-06 DIAGNOSIS — I739 Peripheral vascular disease, unspecified: Secondary | ICD-10-CM

## 2020-06-06 NOTE — Patient Instructions (Signed)
Medication Instructions:  Continue all current medications.  Labwork: none  Testing/Procedures: none  Follow-Up: 3 months   Any Other Special Instructions Will Be Listed Below (If Applicable).  If you need a refill on your cardiac medications before your next appointment, please call your pharmacy.  

## 2020-06-10 ENCOUNTER — Ambulatory Visit (INDEPENDENT_AMBULATORY_CARE_PROVIDER_SITE_OTHER): Payer: Medicare Other | Admitting: *Deleted

## 2020-06-10 ENCOUNTER — Other Ambulatory Visit (HOSPITAL_BASED_OUTPATIENT_CLINIC_OR_DEPARTMENT_OTHER): Payer: Self-pay

## 2020-06-10 DIAGNOSIS — E119 Type 2 diabetes mellitus without complications: Secondary | ICD-10-CM

## 2020-06-10 DIAGNOSIS — G4733 Obstructive sleep apnea (adult) (pediatric): Secondary | ICD-10-CM

## 2020-06-10 DIAGNOSIS — I5041 Acute combined systolic (congestive) and diastolic (congestive) heart failure: Secondary | ICD-10-CM

## 2020-06-10 NOTE — Chronic Care Management (AMB) (Signed)
Chronic Care Management   CCM RN Visit Note  06/10/2020 Name: Daniel Myers MRN: [id number] DOB: [birth year]  Subjective: Daniel Myers is a 75 y.o. year old male who is a primary care patient of Stacks, [name], MD. The care management team was consulted for assistance with disease management and care coordination needs.    Engaged with patient by telephone for follow up visit in response to provider referral for case management and/or care coordination services.   Consent to Services:  The patient was given information about Chronic Care Management services, agreed to services, and gave verbal consent prior to initiation of services.  Please see initial visit note for detailed documentation.   Patient agreed to services and verbal consent obtained.   Assessment: Review of patient past medical history, allergies, medications, health status, including review of consultants reports, laboratory and other test data, was performed as part of comprehensive evaluation and provision of chronic care management services.   SDOH (Social Determinants of Health) assessments and interventions performed:    CCM Care Plan  Allergies  Allergen Reactions  . Other Rash    Use paper tape.   . Tape Rash    Use paper tape.   . Trazodone And Nefazodone Palpitations    tachycardia  . Mirtazapine     imbalance  . Elemental Sulfur Rash    Outpatient Encounter Medications as of 06/10/2020  Medication Sig Note  . albuterol (VENTOLIN HFA) 108 (90 Base) MCG/ACT inhaler Inhale 2 puffs into the lungs every 6 (six) hours as needed for wheezing or shortness of breath.   . ALPRAZolam (XANAX) 0.25 MG tablet Take 0.5 mg by mouth at bedtime as needed for anxiety.   Marland Kitchen apixaban (ELIQUIS) 5 MG TABS tablet Take 1 tablet (5 mg total) by mouth 2 (two) times daily.   Marland Kitchen ascorbic acid (VITAMIN C) 500 MG tablet Take 1 tablet (500 mg total) by mouth daily.   Marland Kitchen atorvastatin (LIPITOR) 10 MG tablet Take 1 tablet (10 mg total) by  mouth daily.   . blood glucose meter kit and supplies Dispense based on patient and insurance preference. Use up to four times daily as directed. (FOR ICD-10 E10.9, E11.9).   . canagliflozin (INVOKANA) 300 MG TABS tablet Take 1 tablet (300 mg total) by mouth daily before breakfast.   . fexofenadine-pseudoephedrine (ALLEGRA-D 24) 180-240 MG 24 hr tablet Take 1 tablet by mouth every evening. For allergy and congestion   . fluorouracil (EFUDEX) 5 % cream Apply 1 application topically daily.   . fluticasone (FLONASE) 50 MCG/ACT nasal spray USE ONE SPRAY(S) IN EACH NOSTRIL ONCE DAILY   . furosemide (LASIX) 40 MG tablet Take 1 tablet (40 mg total) by mouth daily.   . insulin NPH Human (NOVOLIN N) 100 UNIT/ML injection 30 units sq AC breakfast, 40 units sq ac supper 03/12/2020: 03/12/2020 Per daughter, [name], taking Relion N 27 units in the morning and 27 units qhs  . Insulin Regular Human 100 UNIT/ML SOPN Inject 12 units before lunch and 7 units before supper. 03/12/2020: 03/12/2020 Per daughter, [name], taking Relion R 20 units TID with meals (when meals are eaten)  . loratadine (CLARITIN) 10 MG tablet Take 10 mg by mouth daily as needed for allergies.   . metoprolol tartrate (LOPRESSOR) 50 MG tablet Take 1 tablet (50 mg total) by mouth 2 (two) times daily.   . mycophenolate (CELLCEPT) 250 MG capsule TAKE 2 CAPSULES BY MOUTH TWO TIMES DAILY.   Marland Kitchen ondansetron (ZOFRAN) 4 MG  tablet Take 1 tablet (4 mg total) by mouth every 8 (eight) hours as needed for nausea or vomiting.   . potassium chloride SA (KLOR-CON) 20 MEQ tablet Take 1 tablet (20 mEq total) by mouth daily. For potassium replacement/ supplement   . predniSONE (DELTASONE) 5 MG tablet Take 5 mg by mouth daily with breakfast.   . RELION INSULIN SYRINGE 31G X 15/64" 0.5 ML MISC USE AS DIRECTED FOR INSULIN   . sodium bicarbonate 650 MG tablet Take 650 mg by mouth 2 (two) times daily.   Marland Kitchen spironolactone (ALDACTONE) 25 MG tablet Take 1 tablet (25 mg total) by mouth  daily.   . tacrolimus (PROGRAF) 0.5 MG capsule TAKE 2 CAPSULES BY MOUTH TWO TIMES DAILY.   Marland Kitchen zinc sulfate 220 (50 Zn) MG capsule Take 1 capsule (220 mg total) by mouth daily.    No facility-administered encounter medications on file as of 06/10/2020.    Patient Active Problem List   Diagnosis Date Noted  . Persistent atrial fibrillation ([place]) 03/11/2020  . Systolic and diastolic CHF, acute ([place]) 02/13/2020  . Thrombocytopenia ([place]) 02/13/2020  . Pneumonia due to COVID-19 virus 02/12/2020  . Hyperglycemia due to diabetes mellitus ([place]) 02/12/2020  . CKD (chronic kidney disease), stage III ([place]) 02/12/2020  . Essential hypertension 11/22/2018  . Insulin dependent type 2 diabetes mellitus ([place]) 11/22/2018  . 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 Mixed hyperlipidemia 11/22/2018  . Vision decreased 11/22/2018  . Hyponatremia 11/22/2018  . Seasonal allergic rhinitis due to pollen 11/22/2018    Conditions to be addressed/monitored:CHF and DMII  Care Plan : RNCM: General Plan of Care (Adult)  Updates made by Ilean China, RN since 06/10/2020 12:00 AM    Problem: Quality of Life (General Plan of Care)     Goal: Quality of Life Improved   Start Date: 03/12/2020  This Visit's Progress: On track  Recent Progress: On track  Priority: High  Note:   Current Barriers:  . Care Coordination needs related to improving quality of life and independence in a patient with HTN, HLD, CKD, CHF, DM, AFib, hx of MI, recent covid infection with pneumonia, and new onset left sided weakness . Unable to perform IADLs independently  Nurse Case Manager Clinical Goal(s):  . patient will work with Consulting civil engineer to address needs related to left sided weakness and decreased ability to care for himself s/p CVA and covid infection . Patient will work with home health physical therapy and nursing to improve strength, mobility, and ability to perform ADLs independently.   Interventions:  . 1:1 collaboration  with Claretta Fraise, MD regarding development and update of comprehensive plan of care as evidenced by provider attestation and co-signature . Inter-disciplinary care team collaboration (see longitudinal plan of care) . Chart reviewed including relevant office notes, referral notes, lab results, orders, and imaging reports . Medications reviewed . Reviewed mobility and ability to perform ADLs. o Patient uses a standard walker in the home and has a wheelchair for appointments o Ramp at the front door o Increased ability to do ADLs independently with assistance at times o Reports feeling much better . Reviewed and discussed upcoming appointments and pending referral to vascular surgeon o Collaborated with [facility] referral coordinator about status of referral . Previously discussed family/social support . Discussed diet  o Appetite has returned to normal o Reinforced diabetic diet for blood sugar management - Proper nutrition with protein, vegetables, and complex carbs will provide energy and will help to improve strength  and healing . Encouraged to talk with LCSW regarding psychosocial needs . Provided with RNCM contact number and encouraged to reach out as needed . Encouraged to reach out to PCP with any new or worsening symptoms  Patient Goals/Self-Care Activities Over the next 30 days, patient will: . Continue to use walker for ambulation . Work with Consulting civil engineer to discuss ways to self-manage medical conditions [phone] . Work with LCSW to discuss coping mechanisms and stress management [phone] . Reach out to PCP with any new or worsening problems [phone] . Eat meals at regular intervals in order to maintain blood sugar levels and strength    Care Plan : RNCM: Diabetes Type 2 (Adult)  Updates made by Ilean China, RN since 06/10/2020 12:00 AM    Problem: Glycemic Management (Diabetes, Type 2)   Priority: Medium    Long-Range Goal: Glycemic Management Optimized    Start Date: 03/12/2020  This Visit's Progress: On track  Recent Progress: On track  Priority: Medium  Note:   Objective: Lab Results  Component Value Date   HGBA1C 7.0 (H) 04/03/2020   HGBA1C 6.9 (H) 02/12/2020   HGBA1C 7.4 (H) 01/02/2020   Lab Results  Component Value Date   LDLCALC 61 04/03/2020   CREATININE 1.64 (H) 06/04/2020   Current Barriers:  . Chronic Disease Management support and education needs related to diabetes in a patient with HTN, HLD, CKD, Afib, hx of MI  Nurse Case Manager Clinical Goal(s):  . patient will work with PCP to address needs related to medical management of diabetes . patient will meet with RN Care Manager to address self-management of diabetes . patient will demonstrate improved adherence to prescribed treatment plan for diabetes as evidenced by daily blood sugar readings within recommended range  Interventions:  . 1:1 collaboration with Claretta Fraise, MD regarding development and update of comprehensive plan of care as evidenced by provider attestation and co-signature . Inter-disciplinary care team collaboration (see longitudinal plan of care) . Evaluation of current treatment plan related to daibetes and patient's adherence to plan as established by provider. . Chart reviewed including relevant office notes and lab results . Reviewed and discussed medications o Compliant with medications o No recent changes . Discussed home blood sugar monitoring o Testing 3 to 4 times a day o Typically 100-150 fasting o 200-300 during the day o No episodes below 70 over the past two weeks . Continue to check and record blood sugar 3 to 4 times a day and call PCP with any readings outside of recommended range . Discussed diet o Appetite has returned to normal o Reinforced diabetic diet and recommended to provide meals at regular intervals  . Reviewed and discussed upcoming appointments . Encouraged to reach out to [facility] as needed  Patient  Goals/Self-Care Activities Over the next 30 days, patient will: . check blood sugar at prescribed times . check blood sugar if I feel it is too high or too low . enter blood sugar readings and medication or insulin into daily log . take the blood sugar log to all doctor visits  . Call PCP [phone] with any readings outside of recommended range . Eat meals at regular intervals . Follow a diabetic diet for blood sugar control . Reach out to [place] with any questions [phone]    Care Plan : RNCM: Heart Failure (Adult)  Updates made by Ilean China, RN since 06/10/2020 12:00 AM    Problem: Symptom Exacerbation (Heart Failure)  Priority: Medium    Long-Range Goal: Symptom Exacerbation Prevented or Minimized   Start Date: 05/06/2020  This Visit's Progress: On track  Recent Progress: Not on track  Priority: Medium  Note:   Current Barriers:  . Chronic Disease Management support and education needs related to CHF in a patient with HTN, DM, CKD, Afib and recent CVA  Nurse Case Manager Clinical Goal(s):  . patient will work with cardiologist to address needs related to medical management of CHF . patient will meet with RN Care Manager to address self-management of CHF  Interventions:  . 1:1 collaboration with Claretta Fraise, MD regarding development and update of comprehensive plan of care as evidenced by provider attestation and co-signature . Inter-disciplinary care team collaboration (see longitudinal plan of care) . Evaluation of current treatment plan related to CHF and patient's adherence to plan as established by provider. . Chart reviewed including relevant office notes, lab results, and imaging studies . Reviewed and discussed medications . Discussed referral to vascular surgeon for dusky right forefoot  . Reinforced need for daily weights each morning after urinating . Advised to call cardiologist with any weight gain of more than 2 lbs overnight or 5 lbs in  one week . Reviewed and discussed upcoming appointments  Patient Goals/Self-Care Activities Over the next 30 days, patient will: . Weight and record weights daily . Call cardiologist with any weight gain of more than 3 lbs overnight or more than 5 lbs in a week . Keep legs propped up when sitting . Call cardiologist or seek medical attention for any new or worsening symptoms (increased SOB, increased swelling, etc) . Take medications as prescribed . Limit salt/sodium in diet . Keep all medical appointments . Call RN Care Manager as needed [phone]     Follow Up Plan:  . Telephone follow up appointment with care management team member scheduled for: 07/15/20 with RNCM and LCSW 06/23/20 . The patient has been provided with contact information for the care management team and has been advised to call with any health related questions or concerns.  . Next PCP appointment scheduled for: 06/25/20 with Dr Livia Snellen . Next Cardiology appointment scheduled for: 09/12/20  [name], BSN, RN-BC [place] [facility] [place] Management Direct Dial: [phone]

## 2020-06-10 NOTE — Patient Instructions (Signed)
Visit Information  PATIENT GOALS: Goals Addressed            This Visit's Progress   . Improve My Quality of Life   On track    Timeframe:  Short-Term Goal Priority:  High Start Date: 03/12/20                            Expected End Date: 09/09/20                      Follow Up Date 07/15/20   . Continue to use walker for ambulation . Work with Consulting civil engineer to discuss ways to self-manage medical conditions [phone] . Work with LCSW to discuss coping mechanisms and stress management [phone] . Reach out to PCP with any new or worsening problems [phone] . Eat meals at regular intervals in order to maintain blood sugar levels and strength   Why is this important?    Having a long-term illness can be scary.   It can also be stressful for you and your caregiver.   These steps may help.    Notes:     Marland Kitchen Monitor and Manage My Blood Sugar-Diabetes Type 2   On track    Timeframe:  Long-Range Goal Priority:  Medium Start Date:  2/2/222                           Expected End Date: 09/09/20                      Follow Up Date 07/15/20   . check blood sugar at prescribed times . check blood sugar if I feel it is too high or too low . enter blood sugar readings and medication or insulin into daily log . take the blood sugar log to all doctor visits  . Call PCP [phone] with any readings outside of recommended range . Eat meals at regular intervals . Follow a diabetic diet for blood sugar control . Reach out to [place] with any questions [phone]   Why is this important?    Checking your blood sugar at home helps to keep it from getting very high or very low.   Writing the results in a diary or log helps the doctor know how to care for you.   Your blood sugar log should have the time, date and the results.   Also, write down the amount of insulin or other medicine that you take.   Other information, like what you ate, exercise done and how you were  feeling, will also be helpful.     Notes:     . Track and Manage Fluids and Swelling-Heart Failure   On track    Timeframe:  Long-Range Goal Priority:  Medium Start Date:  05/06/20                           Expected End Date: 05/06/21                      Follow Up Date 07/15/20   . Weight and record weights daily . Call cardiologist with any weight gain of more than 3 lbs overnight or more than 5 lbs in a week . Keep legs propped up when sitting . Call cardiologist or seek medical attention for any  new or worsening symptoms (increased SOB, increased swelling, etc) . Take medications as prescribed . Limit salt/sodium in diet . Keep all medical appointments . Call RN Care Manager as needed [phone]   Why is this important?    It is important to check your weight daily and watch how much salt and liquids you have.   It will help you to manage your heart failure.    Notes:        Patient verbalizes understanding of instructions provided today and agrees to view in [place].   Follow Up Plan:  . Telephone follow up appointment with care management team member scheduled for: 07/15/20 with RNCM and LCSW 06/23/20 . The patient has been provided with contact information for the care management team and has been advised to call with any health related questions or concerns.  . Next PCP appointment scheduled for: 06/25/20 with Dr Livia Snellen . Next Cardiology appointment scheduled for: 09/12/20  [name], BSN, RN-BC [place] [facility] [place] Management Direct Dial: [phone]

## 2020-06-11 MED ORDER — SPIRONOLACTONE 25 MG PO TABS: 12.5000 mg | ORAL_TABLET | Freq: Every day | ORAL | Status: DC

## 2020-06-11 NOTE — Progress Notes (Signed)
Patient & daughter Daniel Myers) notified.

## 2020-06-11 NOTE — Addendum Note (Signed)
Addended by: Laurine Blazer on: 06/11/2020 11:59 AM   Modules accepted: Orders

## 2020-06-23 ENCOUNTER — Other Ambulatory Visit: Payer: Self-pay | Admitting: Family Medicine

## 2020-06-23 ENCOUNTER — Ambulatory Visit: Payer: Medicare Other | Admitting: Licensed Clinical Social Worker

## 2020-06-23 DIAGNOSIS — I10 Essential (primary) hypertension: Secondary | ICD-10-CM

## 2020-06-23 DIAGNOSIS — U071 COVID-19: Secondary | ICD-10-CM

## 2020-06-23 DIAGNOSIS — E119 Type 2 diabetes mellitus without complications: Secondary | ICD-10-CM

## 2020-06-23 DIAGNOSIS — I5041 Acute combined systolic (congestive) and diastolic (congestive) heart failure: Secondary | ICD-10-CM

## 2020-06-23 DIAGNOSIS — I4891 Unspecified atrial fibrillation: Secondary | ICD-10-CM

## 2020-06-23 DIAGNOSIS — I252 Old myocardial infarction: Secondary | ICD-10-CM

## 2020-06-23 DIAGNOSIS — N183 Chronic kidney disease, stage 3 unspecified: Secondary | ICD-10-CM | POA: Diagnosis not present

## 2020-06-23 DIAGNOSIS — Z794 Long term (current) use of insulin: Secondary | ICD-10-CM

## 2020-06-23 DIAGNOSIS — E782 Mixed hyperlipidemia: Secondary | ICD-10-CM

## 2020-06-23 DIAGNOSIS — I4819 Other persistent atrial fibrillation: Secondary | ICD-10-CM

## 2020-06-23 NOTE — Chronic Care Management (AMB) (Signed)
Chronic Care Management    Clinical Social Work Note  06/23/2020 Name: Daniel Myers MRN: [id number] DOB: [birth year]  Daniel Myers is a 75 y.o. year old male who is a primary care patient of Stacks, [name], MD. The CCM team was consulted to assist the patient with chronic disease management and/or care coordination needs related to: [employer] .   Engaged with patient/ daughter of patient, Anastasia Pall,  by telephone for follow up visit in response to provider referral for social work chronic care management and care coordination services.   Consent to Services:  The patient was given information about Chronic Care Management services, agreed to services, and gave verbal consent prior to initiation of services.  Please see initial visit note for detailed documentation.   Patient agreed to services and consent obtained.   Assessment: Review of patient past medical history, allergies, medications, and health status, including review of relevant consultants reports was performed today as part of a comprehensive evaluation and provision of chronic care management and care coordination services.     SDOH (Social Determinants of Health) assessments and interventions performed:  SDOH Interventions   Flowsheet Row Most Recent Value  SDOH Interventions   Depression Interventions/Treatment  Medication       Advanced Directives Status: See Vynca application for related entries.  CCM Care Plan  Allergies  Allergen Reactions  . Other Rash    Use paper tape.   . Tape Rash    Use paper tape.   . Trazodone And Nefazodone Palpitations    tachycardia  . Mirtazapine     imbalance  . Elemental Sulfur Rash    Outpatient Encounter Medications as of 06/23/2020  Medication Sig Note  . albuterol (VENTOLIN HFA) 108 (90 Base) MCG/ACT inhaler Inhale 2 puffs into the lungs every 6 (six) hours as needed for wheezing or shortness of breath.   . ALPRAZolam (XANAX) 0.25 MG tablet Take 0.5 mg by  mouth at bedtime as needed for anxiety.   Marland Kitchen apixaban (ELIQUIS) 5 MG TABS tablet Take 1 tablet (5 mg total) by mouth 2 (two) times daily.   Marland Kitchen ascorbic acid (VITAMIN C) 500 MG tablet Take 1 tablet (500 mg total) by mouth daily.   Marland Kitchen atorvastatin (LIPITOR) 10 MG tablet Take 1 tablet (10 mg total) by mouth daily.   . blood glucose meter kit and supplies Dispense based on patient and insurance preference. Use up to four times daily as directed. (FOR ICD-10 E10.9, E11.9).   . canagliflozin (INVOKANA) 300 MG TABS tablet Take 1 tablet (300 mg total) by mouth daily before breakfast.   . fexofenadine-pseudoephedrine (ALLEGRA-D 24) 180-240 MG 24 hr tablet Take 1 tablet by mouth every evening. For allergy and congestion   . fluorouracil (EFUDEX) 5 % cream Apply 1 application topically daily.   . fluticasone (FLONASE) 50 MCG/ACT nasal spray USE ONE SPRAY(S) IN EACH NOSTRIL ONCE DAILY   . furosemide (LASIX) 40 MG tablet Take 1 tablet (40 mg total) by mouth daily.   . insulin NPH Human (NOVOLIN N) 100 UNIT/ML injection 30 units sq AC breakfast, 40 units sq ac supper 03/12/2020: 03/12/2020 Per daughter, [name], taking Relion N 27 units in the morning and 27 units qhs  . Insulin Regular Human 100 UNIT/ML SOPN Inject 12 units before lunch and 7 units before supper. 03/12/2020: 03/12/2020 Per daughter, [name], taking Relion R 20 units TID with meals (when meals are eaten)  . loratadine (CLARITIN) 10 MG tablet Take 10 mg by  mouth daily as needed for allergies.   . metoprolol tartrate (LOPRESSOR) 50 MG tablet Take 1 tablet by mouth twice daily   . mycophenolate (CELLCEPT) 250 MG capsule TAKE 2 CAPSULES BY MOUTH TWO TIMES DAILY.   Marland Kitchen ondansetron (ZOFRAN) 4 MG tablet Take 1 tablet (4 mg total) by mouth every 8 (eight) hours as needed for nausea or vomiting.   . potassium chloride SA (KLOR-CON) 20 MEQ tablet Take 1 tablet (20 mEq total) by mouth daily. For potassium replacement/ supplement   . predniSONE (DELTASONE) 5 MG tablet Take 5  mg by mouth daily with breakfast.   . RELION INSULIN SYRINGE 31G X 15/64" 0.5 ML MISC USE AS DIRECTED FOR INSULIN   . sodium bicarbonate 650 MG tablet Take 650 mg by mouth 2 (two) times daily.   Marland Kitchen spironolactone (ALDACTONE) 25 MG tablet Take 0.5 tablets (12.5 mg total) by mouth daily.   . tacrolimus (PROGRAF) 0.5 MG capsule TAKE 2 CAPSULES BY MOUTH TWO TIMES DAILY.   Marland Kitchen zinc sulfate 220 (50 Zn) MG capsule Take 1 capsule (220 mg total) by mouth daily.    No facility-administered encounter medications on file as of 06/23/2020.    Patient Active Problem List   Diagnosis Date Noted  . Persistent atrial fibrillation ([place]) 03/11/2020  . Systolic and diastolic CHF, acute ([place]) 02/13/2020  . Thrombocytopenia ([place]) 02/13/2020  . Pneumonia due to COVID-19 virus 02/12/2020  . Hyperglycemia due to diabetes mellitus ([place]) 02/12/2020  . CKD (chronic kidney disease), stage III ([place]) 02/12/2020  . Essential hypertension 11/22/2018  . Insulin dependent type 2 diabetes mellitus ([place]) 11/22/2018  . 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 Mixed hyperlipidemia 11/22/2018  . Vision decreased 11/22/2018  . Hyponatremia 11/22/2018  . Seasonal allergic rhinitis due to pollen 11/22/2018    Conditions to be addressed/monitored:  Monitor client management of depression issues   Care Plan : LCSW Care Plan  Updates made by Katha Cabal, LCSW since 06/23/2020 12:00 AM    Problem: Emotional Distress     Goal: Emotional Health Supported;Manage Depression symptoms   Start Date: 06/23/2020  Expected End Date: 09/23/2020  This Visit's Progress: On track  Priority: Medium  Note:   Current Barriers:  . Chronic Mental Health needs related to depression and depression management . Mobility issues . Suicidal Ideation/Homicidal Ideation: No  Clinical Social Work Goal(s):  . patient will work with SW monthly by telephone or in person to reduce or manage symptoms related to depression and depression  management . Patient to call RNCM or LCSW as needed in next 30 days for CCM support  Interventions: . 1:1 collaboration with Claretta Fraise, MD regarding development and update of comprehensive plan of care as evidenced by provider attestation and co-signature . Talked with Anastasia Pall, daughter of client, about client needs . Talked with [name] abut ambulation of client (client uses a walker to help him walk) . Talked with [name] about mood of client ([name] said client mood is good at present; [name] said client is sleeping well) . Talked with [name] about pain issues of client . Talked with [name] about transport needs of client . Talked with [name] about upcoming medical appointments of client (client has appointment tomorrow with Dr. Livia Snellen) . Talked with [name] about client support with cardiologist . Talked with [name] about kidney issues of client (she said client had a kidney transplant  13 years ago) . Talked with [name] about energy level of client . Talked with [name] about family  support for client . Encouraged client or [name] to call RNCM as needed for client nursing support  Patient Self Care Activities:  . Self administers medications as prescribed . Attends all scheduled provider appointments . Performs ADL's independently  Patient Coping Strengths:  . Family . Friends  Patient Self Care Deficits:  Marland Kitchen Depression issues  Patient Goals:  - spend time or talk with others at least 2 to 3 times per week - practice relaxation or meditation daily - keep a calendar with appointment dates  Follow Up Plan: LCSW to call client on 07/28/20     [name].Kaelum Kissick MSW, LCSW Licensed Clinical Social Worker [facility] Care Management [phone]

## 2020-06-23 NOTE — Patient Instructions (Signed)
Visit Information  PATIENT GOALS: Goals Addressed              This Visit's Progress   .  Manage My Emotions;Manage Depression issues (pt-stated)        Timeframe:  Short-Term Goal Priority:  Medium Progress: On Track Start Date : 06/23/20                          Expected End Date: 09/23/20                  Follow Up Date: LCSW will call Daniel Myers, daughter of client, on 07/28/20    Manage My Emotions (Patient)  ; Manage Depression issues   Why is this important?    When you are stressed, down or upset, your body reacts too.   For example, your blood pressure may get higher; you may have a headache or stomachache.   When your emotions get the best of you, your body's ability to fight off cold and flu gets weak.   These steps will help you manage your emotions.     Patient Self Care Activities:  . Self administers medications as prescribed . Attends all scheduled provider appointments . Performs ADL's independently  Patient Coping Strengths:  . Family . Friends  Patient Self Care Deficits:  Marland Kitchen Depression issues  Patient Goals:  - spend time or talk with others at least 2 to 3 times per week - practice relaxation or meditation daily - keep a calendar with appointment dates  Follow Up Plan: LCSW to call client on 07/28/20        [name].Daniel Myers MSW, LCSW Licensed Clinical Social Worker [facility] Care Management [phone]

## 2020-06-24 ENCOUNTER — Ambulatory Visit (INDEPENDENT_AMBULATORY_CARE_PROVIDER_SITE_OTHER): Payer: Medicare Other | Admitting: Family Medicine

## 2020-06-24 ENCOUNTER — Other Ambulatory Visit: Payer: Self-pay

## 2020-06-24 ENCOUNTER — Encounter: Payer: Self-pay | Admitting: Family Medicine

## 2020-06-24 VITALS — BP 141/87 | HR 197 | Temp 97.0°F | Resp 20 | Ht 70.0 in | Wt 185.0 lb

## 2020-06-24 DIAGNOSIS — Z940 Kidney transplant status: Secondary | ICD-10-CM | POA: Diagnosis not present

## 2020-06-24 DIAGNOSIS — I2510 Atherosclerotic heart disease of native coronary artery without angina pectoris: Secondary | ICD-10-CM | POA: Diagnosis not present

## 2020-06-24 DIAGNOSIS — N183 Chronic kidney disease, stage 3 unspecified: Secondary | ICD-10-CM | POA: Diagnosis not present

## 2020-06-24 DIAGNOSIS — I5041 Acute combined systolic (congestive) and diastolic (congestive) heart failure: Secondary | ICD-10-CM

## 2020-06-24 NOTE — Progress Notes (Signed)
Subjective:  Patient ID: Daniel Myers, male    DOB: [birth year]  Age: 75 y.o. MRN: [id number]  CC: Follow-up   HPI Daniel Myers presents for recheck of his shortness of breath and COVID-pneumonia.  They note that with the addition of the most recent fluid pill, spironolactone that he has lost about 18 pounds.  He feels much better he is stronger.  He is breathing well with a sat of 99 range.  He has not had any edema.  He has no  new complaints today  Depression screen [facility] 2/9 06/24/2020 06/24/2020 06/23/2020  Decreased Interest 0 0 0  Down, Depressed, Hopeless 0 0 0  PHQ - 2 Score 0 0 0  Altered sleeping 0 - 1  Tired, decreased energy 0 - 1  Change in appetite 0 - 0  Feeling bad or failure about yourself  0 - 0  Trouble concentrating 0 - 0  Moving slowly or fidgety/restless 0 - 1  Suicidal thoughts 0 - 0  PHQ-9 Score 0 - 3  Difficult doing work/chores - - Somewhat difficult    History Daniel Myers has a past medical history of Acute respiratory failure with hypoxia ([place]) (02/12/2020), AKI (acute kidney injury) ([place]) (02/13/2020), Atrial fibrillation with RVR ([place]) (03/11/2020), Basal cell carcinoma (06/27/2013), Basal cell carcinoma (07/01/2014), Basal cell carcinoma (10/10/2014), Basal cell carcinoma (02/11/2015), Basal cell carcinoma (06/14/2016), Basal cell carcinoma (02/22/2017), Basal cell carcinoma (04/11/2018), Chronic kidney disease, History of renal transplant, Hyperlipidemia, Hypertension, NSTEMI (non-ST elevated myocardial infarction) ([place]) (02/13/2020), 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), 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), 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), Squamous cell carcinoma of skin (11/08/2011), Squamous cell carcinoma of skin (11/08/2011), Squamous cell carcinoma of skin (11/08/2011), 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), 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), 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), Squamous cell carcinoma of skin  (06/27/2013), 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), 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), 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), 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), Squamous cell carcinoma of skin (09/30/2015), Squamous cell carcinoma of skin (02/22/2017), Squamous cell carcinoma of skin (02/22/2017), Squamous cell carcinoma of skin (04/11/2018), Squamous cell carcinoma of skin (04/11/2018), Squamous cell carcinoma of skin (04/11/2018), Squamous cell carcinoma of skin (04/11/2018), Squamous cell carcinoma of skin (04/11/2018), Squamous cell carcinoma of skin (09/28/2018), Squamous cell carcinoma of skin (09/28/2018), Squamous cell carcinoma of skin (09/28/2018), Squamous cell carcinoma of skin (03/21/2019), Squamous cell carcinoma of skin (03/21/2019), Squamous cell carcinoma of skin (03/21/2019), Type 2 diabetes mellitus ([place]), and Unspecified atrial fibrillation ([employer]) (02/17/2020).   He has a past surgical history that includes Kidney transplant (Right).   His family history includes Clotting disorder in his mother; Diabetes in his sister; Hypertension in his sister.He reports that he has quit smoking. His smoking use included pipe. He has never used smokeless tobacco. He reports that he does not drink alcohol and does not use drugs.    ROS Review of Systems  Constitutional: Negative for fever.  Respiratory: Negative for shortness of breath.   Cardiovascular: Negative for chest pain.  Musculoskeletal: Negative for arthralgias.  Skin: Negative for rash.    Objective:  BP (!) 141/87   Pulse (!) 197   Temp (!) 97 F (36.1 C) (Temporal)   Resp 20   Ht _0  (1.778 m)   Wt 185 lb (83.9 kg)   SpO2 98%   BMI 26.54 kg/m   BP Readings from Last 3 Encounters:  06/24/20 Marland Kitchen)  141/87  06/06/20 118/72  06/04/20 131/81    Wt Readings from Last 3 Encounters:  06/24/20 185 lb (83.9 kg)  06/06/20 207 lb 3.2 oz (94 kg)  06/04/20 207 lb 12.8 oz (94.3 kg)      Physical Exam Vitals reviewed.  Constitutional:      Appearance: He is well-developed.  HENT:     Head: Normocephalic and atraumatic.     Right Ear: External ear normal.     Left Ear: External ear normal.     Mouth/Throat:     Pharynx: No oropharyngeal exudate or posterior oropharyngeal erythema.  Eyes:     Pupils: Pupils are equal, round, and reactive to light.  Cardiovascular:     Rate and Rhythm: Normal rate and regular rhythm.     Heart sounds: No murmur heard.   Pulmonary:     Effort: No respiratory distress.     Breath sounds: Normal breath sounds.  Musculoskeletal:     Cervical back: Normal range of motion and neck supple.  Neurological:     Mental Status: He is alert and oriented to person, place, and time.       Assessment & Plan:   Daniel Myers was seen today for follow-up.  Diagnoses and all orders for this visit:  Stage 3 chronic kidney disease, unspecified whether stage 3a or 3b CKD ([place]) -     CMP14+EGFR  Renal transplant recipient -     LXB26+OMBT  Systolic and diastolic CHF, acute ([place]) -     CMP14+EGFR       I am having Daniel Myers maintain his tacrolimus, sodium bicarbonate, mycophenolate, ReliOn Insulin Syringe, loratadine, fexofenadine-pseudoephedrine, insulin NPH Human, fluorouracil, blood glucose meter kit and supplies, Insulin Regular Human, zinc sulfate, ascorbic acid, albuterol, potassium chloride SA, predniSONE, furosemide, apixaban, ondansetron, ALPRAZolam, atorvastatin, fluticasone, canagliflozin, spironolactone, and metoprolol tartrate.  Allergies as of 06/24/2020      Reactions   Other Rash   Use paper tape.    Tape Rash   Use paper tape.    Trazodone And Nefazodone Palpitations   tachycardia   Mirtazapine    imbalance   Elemental Sulfur Rash      Medication List       Accurate as of Jun 24, 2020 10:47 PM. If you have any questions, ask your nurse or doctor.        albuterol 108 (90 Base) MCG/ACT  inhaler Commonly known as: VENTOLIN HFA Inhale 2 puffs into the lungs every 6 (six) hours as needed for wheezing or shortness of breath.   ALPRAZolam 0.25 MG tablet Commonly known as: XANAX Take 0.5 mg by mouth at bedtime as needed for anxiety.   apixaban 5 MG Tabs tablet Commonly known as: ELIQUIS Take 1 tablet (5 mg total) by mouth 2 (two) times daily.   ascorbic acid 500 MG tablet Commonly known as: VITAMIN C Take 1 tablet (500 mg total) by mouth daily.   atorvastatin 10 MG tablet Commonly known as: LIPITOR Take 1 tablet (10 mg total) by mouth daily.   blood glucose meter kit and supplies Dispense based on patient and insurance preference. Use up to four times daily as directed. (FOR ICD-10 E10.9, E11.9).   canagliflozin 300 MG Tabs tablet Commonly known as: Invokana Take 1 tablet (300 mg total) by mouth daily before breakfast.   fexofenadine-pseudoephedrine 180-240 MG 24 hr tablet Commonly known as: ALLEGRA-D 24 Take 1 tablet by mouth every evening. For allergy and congestion   fluorouracil 5 %  cream Commonly known as: EFUDEX Apply 1 application topically daily.   fluticasone 50 MCG/ACT nasal spray Commonly known as: FLONASE USE ONE SPRAY(S) IN EACH NOSTRIL ONCE DAILY   furosemide 40 MG tablet Commonly known as: LASIX Take 1 tablet (40 mg total) by mouth daily.   insulin NPH Human 100 UNIT/ML injection Commonly known as: NOVOLIN N 30 units sq AC breakfast, 40 units sq ac supper   Insulin Regular Human 100 UNIT/ML Sopn Inject 12 units before lunch and 7 units before supper.   loratadine 10 MG tablet Commonly known as: CLARITIN Take 10 mg by mouth daily as needed for allergies.   metoprolol tartrate 50 MG tablet Commonly known as: LOPRESSOR Take 1 tablet by mouth twice daily   mycophenolate 250 MG capsule Commonly known as: CELLCEPT TAKE 2 CAPSULES BY MOUTH TWO TIMES DAILY.   ondansetron 4 MG tablet Commonly known as: ZOFRAN Take 1 tablet (4 mg total)  by mouth every 8 (eight) hours as needed for nausea or vomiting.   potassium chloride SA 20 MEQ tablet Commonly known as: KLOR-CON Take 1 tablet (20 mEq total) by mouth daily. For potassium replacement/ supplement   predniSONE 5 MG tablet Commonly known as: DELTASONE Take 5 mg by mouth daily with breakfast.   ReliOn Insulin Syringe 31G X 15/64" 0.5 ML Misc Generic drug: Insulin Syringe-Needle U-100 USE AS DIRECTED FOR INSULIN   sodium bicarbonate 650 MG tablet Take 650 mg by mouth 2 (two) times daily.   spironolactone 25 MG tablet Commonly known as: ALDACTONE Take 0.5 tablets (12.5 mg total) by mouth daily.   tacrolimus 0.5 MG capsule Commonly known as: PROGRAF TAKE 2 CAPSULES BY MOUTH TWO TIMES DAILY.   zinc sulfate 220 (50 Zn) MG capsule Take 1 capsule (220 mg total) by mouth daily.        Follow-up: Return in about 6 weeks (around 08/05/2020).  Claretta Fraise, M.D.

## 2020-06-25 ENCOUNTER — Ambulatory Visit: Payer: Medicare Other | Admitting: Family Medicine

## 2020-06-25 ENCOUNTER — Encounter: Payer: Medicare Other | Admitting: Neurology

## 2020-06-26 ENCOUNTER — Other Ambulatory Visit: Payer: Self-pay

## 2020-06-26 ENCOUNTER — Other Ambulatory Visit: Payer: Medicare Other

## 2020-06-27 LAB — CMP14+EGFR
ALT: 9 IU/L (ref 0–44)
AST: 18 IU/L (ref 0–40)
Albumin/Globulin Ratio: 2.1 (ref 1.2–2.2)
Albumin: 4.2 g/dL (ref 3.7–4.7)
Alkaline Phosphatase: 66 IU/L (ref 44–121)
BUN/Creatinine Ratio: 25 — ABNORMAL HIGH (ref 10–24)
BUN: 41 mg/dL — ABNORMAL HIGH (ref 8–27)
Bilirubin Total: 0.7 mg/dL (ref 0.0–1.2)
CO2: 20 mmol/L (ref 20–29)
Calcium: 8.9 mg/dL (ref 8.6–10.2)
Chloride: 95 mmol/L — ABNORMAL LOW (ref 96–106)
Creatinine, Ser: 1.67 mg/dL — ABNORMAL HIGH (ref 0.76–1.27)
Globulin, Total: 2 g/dL (ref 1.5–4.5)
Glucose: 242 mg/dL — ABNORMAL HIGH (ref 65–99)
Potassium: 4.4 mmol/L (ref 3.5–5.2)
Sodium: 133 mmol/L — ABNORMAL LOW (ref 134–144)
Total Protein: 6.2 g/dL (ref 6.0–8.5)
eGFR: 43 mL/min/{1.73_m2} — ABNORMAL LOW (ref >59)

## 2020-06-29 NOTE — Progress Notes (Signed)
Hello [place],  Your lab result is normal and/or stable.Some minor variations that are not significant are commonly marked abnormal, but do not represent any medical problem for you.  Best regards, Kristle Wesch, M.D.

## 2020-07-03 ENCOUNTER — Other Ambulatory Visit: Payer: Self-pay | Admitting: *Deleted

## 2020-07-03 DIAGNOSIS — M79673 Pain in unspecified foot: Secondary | ICD-10-CM

## 2020-07-08 ENCOUNTER — Other Ambulatory Visit: Payer: Self-pay | Admitting: Family Medicine

## 2020-07-15 ENCOUNTER — Ambulatory Visit (INDEPENDENT_AMBULATORY_CARE_PROVIDER_SITE_OTHER): Payer: Medicare Other | Admitting: *Deleted

## 2020-07-15 DIAGNOSIS — I5041 Acute combined systolic (congestive) and diastolic (congestive) heart failure: Secondary | ICD-10-CM | POA: Diagnosis not present

## 2020-07-15 DIAGNOSIS — E119 Type 2 diabetes mellitus without complications: Secondary | ICD-10-CM | POA: Diagnosis not present

## 2020-07-15 DIAGNOSIS — Z794 Long term (current) use of insulin: Secondary | ICD-10-CM | POA: Diagnosis not present

## 2020-07-15 NOTE — Patient Instructions (Addendum)
Visit Information  PATIENT GOALS: Goals Addressed            This Visit's Progress   . Improve My Quality of Life   On track    Timeframe:  Short-Term Goal Priority:  High Start Date: 03/12/20                            Expected End Date: 02/07/21                 Follow Up Date 09/16/20   . Continue to work on balance and strength . Use walker as needed for ambulation . Talk or visit with friends/family daily . Get outside once a day . Work with Consulting civil engineer to discuss ways to self-manage medical conditions [phone] . Work with LCSW to discuss coping mechanisms and stress management [phone] . Reach out to PCP with any new or worsening problems [phone] . Eat meals at regular intervals in order to maintain blood sugar levels and strength   Why is this important?    Having a long-term illness can be scary.   It can also be stressful for you and your caregiver.   These steps may help.    Notes:     Marland Kitchen Monitor and Manage My Blood Sugar-Diabetes Type 2   On track    Timeframe:  Long-Range Goal Priority:  Medium Start Date:  2/2/222                           Expected End Date: 02/07/21                   Follow Up Date 09/16/20   . check blood sugar at prescribed times . check blood sugar if I feel it is too high or too low . enter blood sugar readings and medication or insulin into daily log . take the blood sugar log to all doctor visits  . Call PCP [phone] with any readings outside of recommended range . Eat meals at regular intervals . Follow a diabetic diet for blood sugar control . Reach out to [place] with any questions [phone]   Why is this important?    Checking your blood sugar at home helps to keep it from getting very high or very low.   Writing the results in a diary or log helps the doctor know how to care for you.   Your blood sugar log should have the time, date and the results.   Also, write down the amount of insulin  or other medicine that you take.   Other information, like what you ate, exercise done and how you were feeling, will also be helpful.     Notes:     . Track and Manage Fluids and Swelling-Heart Failure   On track    Timeframe:  Long-Range Goal Priority:  Medium Start Date:  05/06/20                           Expected End Date: 05/06/21                      Follow Up Date 09/16/20   . Weigh and record weights daily . Call cardiologist with any weight gain of more than 3 lbs overnight or more than 5 lbs in a week . Keep legs  propped up when sitting . Call cardiologist or seek medical attention for any new or worsening symptoms (increased SOB, increased swelling, etc) . Take medications as prescribed . Limit salt/sodium in diet . Keep all medical appointments . Increase activity level as tolerated and continue to work on strength and balance . Call RN Care Manager as needed [phone]   Why is this important?    It is important to check your weight daily and watch how much salt and liquids you have.   It will help you to manage your heart failure.    Notes:        Patient verbalizes understanding of instructions provided today and agrees to view in [place].   Follow Up Plan:  . Telephone follow up appointment with care management team member scheduled for: 09/16/20 with RNCM  . The patient has been provided with contact information for the care management team and has been advised to call with any health related questions or concerns.   [name], BSN, RN-BC Embedded Chronic Care Manager [facility] [place] Management Direct Dial: [phone]

## 2020-07-15 NOTE — Chronic Care Management (AMB) (Signed)
Chronic Care Management   CCM RN Visit Note  07/15/2020 Name: Daniel Myers MRN: [id number] DOB: 1945/12/13  Subjective: Daniel Myers is a 75 y.o. year old male who is a primary care patient of Stacks, [name], MD. The care management team was consulted for assistance with disease management and care coordination needs.    Engaged with patient by telephone for follow up visit in response to provider referral for case management and/or care coordination services.   Consent to Services:  The patient was given information about Chronic Care Management services, agreed to services, and gave verbal consent prior to initiation of services.  Please see initial visit note for detailed documentation.   Patient agreed to services and verbal consent obtained.   Assessment: Review of patient past medical history, allergies, medications, health status, including review of consultants reports, laboratory and other test data, was performed as part of comprehensive evaluation and provision of chronic care management services.   SDOH (Social Determinants of Health) assessments and interventions performed:    CCM Care Plan  Allergies  Allergen Reactions  . Other Rash    Use paper tape.   . Tape Rash    Use paper tape.   . Trazodone And Nefazodone Palpitations    tachycardia  . Mirtazapine     imbalance  . Elemental Sulfur Rash    Outpatient Encounter Medications as of 07/15/2020  Medication Sig Note  . albuterol (VENTOLIN HFA) 108 (90 Base) MCG/ACT inhaler Inhale 2 puffs into the lungs every 6 (six) hours as needed for wheezing or shortness of breath.   . ALPRAZolam (XANAX) 0.25 MG tablet Take 0.5 mg by mouth at bedtime as needed for anxiety.   Marland Kitchen ascorbic acid (VITAMIN C) 500 MG tablet Take 1 tablet (500 mg total) by mouth daily.   Marland Kitchen atorvastatin (LIPITOR) 10 MG tablet Take 1 tablet (10 mg total) by mouth daily.   . blood glucose meter kit and supplies Dispense based on patient and insurance  preference. Use up to four times daily as directed. (FOR ICD-10 E10.9, E11.9).   . canagliflozin (INVOKANA) 300 MG TABS tablet Take 1 tablet (300 mg total) by mouth daily before breakfast.   . ELIQUIS 5 MG TABS tablet Take 1 tablet by mouth twice daily   . fexofenadine-pseudoephedrine (ALLEGRA-D 24) 180-240 MG 24 hr tablet Take 1 tablet by mouth every evening. For allergy and congestion   . fluorouracil (EFUDEX) 5 % cream Apply 1 application topically daily.   . fluticasone (FLONASE) 50 MCG/ACT nasal spray USE ONE SPRAY(S) IN EACH NOSTRIL ONCE DAILY   . furosemide (LASIX) 40 MG tablet Take 1 tablet by mouth once daily   . insulin NPH Human (NOVOLIN N) 100 UNIT/ML injection 30 units sq AC breakfast, 40 units sq ac supper 03/12/2020: 03/12/2020 Per daughter, [name], taking Relion N 27 units in the morning and 27 units qhs  . Insulin Regular Human 100 UNIT/ML SOPN Inject 12 units before lunch and 7 units before supper. 03/12/2020: 03/12/2020 Per daughter, [name], taking Relion R 20 units TID with meals (when meals are eaten)  . loratadine (CLARITIN) 10 MG tablet Take 10 mg by mouth daily as needed for allergies.   . metoprolol tartrate (LOPRESSOR) 50 MG tablet Take 1 tablet by mouth twice daily   . mycophenolate (CELLCEPT) 250 MG capsule TAKE 2 CAPSULES BY MOUTH TWO TIMES DAILY.   Marland Kitchen ondansetron (ZOFRAN) 4 MG tablet Take 1 tablet (4 mg total) by mouth every 8 (eight) hours  as needed for nausea or vomiting.   . potassium chloride SA (KLOR-CON) 20 MEQ tablet Take 1 tablet (20 mEq total) by mouth daily. For potassium replacement/ supplement   . predniSONE (DELTASONE) 5 MG tablet Take 5 mg by mouth daily with breakfast.   . RELION INSULIN SYRINGE 31G X 15/64" 0.5 ML MISC USE AS DIRECTED FOR INSULIN   . sodium bicarbonate 650 MG tablet Take 650 mg by mouth 2 (two) times daily.   Marland Kitchen spironolactone (ALDACTONE) 25 MG tablet Take 0.5 tablets (12.5 mg total) by mouth daily.   . tacrolimus (PROGRAF) 0.5 MG capsule TAKE 2  CAPSULES BY MOUTH TWO TIMES DAILY.   Marland Kitchen zinc sulfate 220 (50 Zn) MG capsule Take 1 capsule (220 mg total) by mouth daily.    No facility-administered encounter medications on file as of 07/15/2020.    Patient Active Problem List   Diagnosis Date Noted  . Persistent atrial fibrillation ([place]) 03/11/2020  . Systolic and diastolic CHF, acute ([place]) 02/13/2020  . Thrombocytopenia ([place]) 02/13/2020  . Pneumonia due to COVID-19 virus 02/12/2020  . Hyperglycemia due to diabetes mellitus ([place]) 02/12/2020  . CKD (chronic kidney disease), stage III ([place]) 02/12/2020  . Essential hypertension 11/22/2018  . Insulin dependent type 2 diabetes mellitus ([place]) 11/22/2018  . 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 Mixed hyperlipidemia 11/22/2018  . Vision decreased 11/22/2018  . Hyponatremia 11/22/2018  . Seasonal allergic rhinitis due to pollen 11/22/2018    Conditions to be addressed/monitored:CHF and DMII  Care Plan : RNCM: General Plan of Care (Adult)  Updates made by Ilean China, RN since 07/15/2020 12:00 AM    Problem: Quality of Life (General Plan of Care)   Priority: Medium    Goal: Quality of Life Improved   Start Date: 03/12/2020  This Visit's Progress: On track  Recent Progress: On track  Priority: Medium  Note:   Current Barriers:  . Care Coordination needs related to improving quality of life and independence in a patient with HTN, HLD, CKD, CHF, DM, AFib, hx of MI, recent covid infection with pneumonia, and new onset left sided weakness  Nurse Case Manager Clinical Goal(s):  . patient will demonstrate improved health management independence as evidenced bykeeping all medical appointments and by calling PCP or specialist with any questions or concerns . Patient will demonstrate improved independence as evidenced by continuing to work on strength and balance and by performing ADLs independently   Interventions:  . 1:1 collaboration with Claretta Fraise, MD regarding development and  update of comprehensive plan of care as evidenced by provider attestation and co-signature . Inter-disciplinary care team collaboration (see longitudinal plan of care) . Chart reviewed including relevant office notes, referral notes, lab results, orders, and imaging reports . Medications reviewed . Reviewed mobility and ability to perform ADLs. o Patient uses a standard walker as needed but he is continuing to work on strength and balance and that is improving o Able to perform ADLs independently  o Reports feeling much better . Reviewed and discussed upcoming appointments  . Verified patient has family/social support . Discussed diet  o Appetite has returned to normal o Reinforced diabetic diet for blood sugar management - Proper nutrition with protein, vegetables, and complex carbs will provide energy and will help to improve strength and healing - Eat meals at regular intervals . Therapeutic listening utilized and praise and encouragement provided for improving strength, balance, and independence  . Encouraged to talk with LCSW regarding psychosocial needs . Provided with  RNCM contact number and encouraged to reach out as needed . Encouraged to reach out to PCP with any new or worsening symptoms  Patient Goals/Self-Care Activities Over the next 60 days, patient will: . Continue to work on balance and strength . Use walker as needed for ambulation . Talk or visit with friends/family daily . Get outside once a day . Work with Consulting civil engineer to discuss ways to self-manage medical conditions [phone] . Work with LCSW to discuss coping mechanisms and stress management [phone] . Reach out to PCP with any new or worsening problems [phone] . Eat meals at regular intervals in order to maintain blood sugar levels and strength    Care Plan : RNCM: Diabetes Type 2 (Adult)  Updates made by Ilean China, RN since 07/15/2020 12:00 AM    Problem: Glycemic Management (Diabetes,  Type 2)   Priority: Medium    Long-Range Goal: Glycemic Management Optimized   Start Date: 03/12/2020  This Visit's Progress: On track  Recent Progress: On track  Priority: Medium  Note:   Objective: Lab Results  Component Value Date   HGBA1C 7.0 (H) 04/03/2020   HGBA1C 6.9 (H) 02/12/2020   HGBA1C 7.4 (H) 01/02/2020   Lab Results  Component Value Date   LDLCALC 61 04/03/2020   CREATININE 1.67 (H) 06/26/2020   Current Barriers:  . Chronic Disease Management support and education needs related to diabetes in a patient with HTN, HLD, CKD, Afib, hx of MI  Nurse Case Manager Clinical Goal(s):  . patient will work with PCP to address needs related to medical management of diabetes . patient will meet with RN Care Manager to address self-management of diabetes . the patient will demonstrate ongoing self health care management ability as evidenced by checking and recording blood sugar 3 times per day and by calling PCP with any readings outside of recommended range*  Interventions:  . 1:1 collaboration with Claretta Fraise, MD regarding development and update of comprehensive plan of care as evidenced by provider attestation and co-signature . Inter-disciplinary care team collaboration (see longitudinal plan of care) . Evaluation of current treatment plan related to daibetes and patient's adherence to plan as established by provider. . Chart reviewed including relevant office notes and lab results . Reviewed and discussed medications o Compliant with medications o No recent changes . Discussed home blood sugar monitoring o Testing 3 to 4 times a day o Typically 100-150 fasting o Some readings above 200 but none above 300 during the day o No episodes below 70 over the past two weeks . Continue to check and record blood sugar 3 to 4 times a day and call PCP with any readings outside of recommended range . Discussed diet o Appetite has returned to normal o Reinforced diabetic diet and  recommended to provide meals at regular intervals  . Reviewed and discussed upcoming appointments . Encouraged to continue to increase activity level was tolerated . Reviewed upcoming appointments . Encouraged to reach out to [facility] as needed  Patient Goals/Self-Care Activities Over the next 60 days, patient will: . check blood sugar at prescribed times . check blood sugar if I feel it is too high or too low . enter blood sugar readings and medication or insulin into daily log . take the blood sugar log to all doctor visits  . Call PCP [phone] with any readings outside of recommended range . Eat meals at regular intervals . Follow a diabetic diet for blood sugar control .  Reach out to [place] with any questions [phone]    Care Plan : RNCM: Heart Failure (Adult)  Updates made by Ilean China, RN since 07/15/2020 12:00 AM    Problem: Symptom Exacerbation (Heart Failure)   Priority: Medium    Long-Range Goal: Symptom Exacerbation Prevented or Minimized   Start Date: 05/06/2020  This Visit's Progress: On track  Recent Progress: On track  Priority: Medium  Note:   Objective: Wt Readings from Last 3 Encounters:  06/24/20 185 lb (83.9 kg)  06/06/20 207 lb 3.2 oz (94 kg)  06/04/20 207 lb 12.8 oz (94.3 kg)   Current Barriers:  . Chronic Disease Management support and education needs related to CHF in a patient with HTN, DM, CKD, Afib and recent CVA  Nurse Case Manager Clinical Goal(s):  . patient will work with cardiologist to address needs related to medical management of CHF . patient will meet with RN Care Manager to address self-management of CHF . the patient will demonstrate ongoing self health care management ability as evidenced by weighing and record weights each morning and by calling cardiologist with any weight gain of more than 3 lbs overnight or 5 lbs in one week*  Interventions:  . 1:1 collaboration with Claretta Fraise, MD regarding  development and update of comprehensive plan of care as evidenced by provider attestation and co-signature . Inter-disciplinary care team collaboration (see longitudinal plan of care) . Evaluation of current treatment plan related to CHF and patient's adherence to plan as established by provider. . Chart reviewed including relevant office notes, lab results, and imaging studies . Reviewed and discussed medications . Reinforced need for daily weights each morning after urinating o Patient is compliant with this and weight is staying stable around 186 . Encouraged to continue to increase activity level as tolerated . Recommended to avoid adding extra salt to food and limit total sodium intake . Advised to call cardiologist with any weight gain of more than 3 lbs overnight or 5 lbs in one week . Reviewed and discussed upcoming appointments  Patient Goals/Self-Care Activities Over the next 60 days, patient will: . Weigh and record weights daily . Call cardiologist with any weight gain of more than 3 lbs overnight or more than 5 lbs in a week . Keep legs propped up when sitting . Call cardiologist or seek medical attention for any new or worsening symptoms (increased SOB, increased swelling, etc) . Take medications as prescribed . Limit salt/sodium in diet . Keep all medical appointments . Increase activity level as tolerated and continue to work on strength and balance . Call RN Care Manager as needed [phone]    Follow Up Plan:  . Telephone follow up appointment with care management team member scheduled for: 09/16/20 with RNCM  . The patient has been provided with contact information for the care management team and has been advised to call with any health related questions or concerns.   [name], BSN, RN-BC Embedded Chronic Care Manager [facility] Direct Dial: [phone]

## 2020-07-28 ENCOUNTER — Telehealth: Payer: Medicare Other

## 2020-07-31 ENCOUNTER — Telehealth: Payer: Self-pay

## 2020-07-31 NOTE — Telephone Encounter (Signed)
Received fax from [place] requesting documentation so that patient can receive his diabetic medical supplies.  Last OV with Stacks and recent labs with A1C faxed to [phone]

## 2020-08-05 ENCOUNTER — Encounter: Payer: Self-pay | Admitting: Family Medicine

## 2020-08-05 ENCOUNTER — Ambulatory Visit (INDEPENDENT_AMBULATORY_CARE_PROVIDER_SITE_OTHER): Payer: Medicare Other | Admitting: Family Medicine

## 2020-08-05 ENCOUNTER — Other Ambulatory Visit: Payer: Self-pay

## 2020-08-05 VITALS — BP 136/67 | HR 86 | Temp 96.8°F | Ht 70.0 in | Wt 189.8 lb

## 2020-08-05 DIAGNOSIS — I2510 Atherosclerotic heart disease of native coronary artery without angina pectoris: Secondary | ICD-10-CM | POA: Diagnosis not present

## 2020-08-05 DIAGNOSIS — Z794 Long term (current) use of insulin: Secondary | ICD-10-CM

## 2020-08-05 DIAGNOSIS — I4819 Other persistent atrial fibrillation: Secondary | ICD-10-CM | POA: Diagnosis not present

## 2020-08-05 DIAGNOSIS — E119 Type 2 diabetes mellitus without complications: Secondary | ICD-10-CM

## 2020-08-05 LAB — BAYER DCA HB A1C WAIVED: HB A1C (BAYER DCA - WAIVED): 7.3 % — ABNORMAL HIGH (ref <7.0)

## 2020-08-05 MED ORDER — INSULIN NPH (HUMAN) (ISOPHANE) 100 UNIT/ML ~~LOC~~ SUSP
SUBCUTANEOUS | 11 refills | Status: DC
Start: 2020-08-05 — End: 2021-08-05

## 2020-08-05 MED ORDER — INSULIN REGULAR HUMAN 100 UNIT/ML IJ SOPN: PEN_INJECTOR | INTRAMUSCULAR | 99 refills | Status: DC

## 2020-08-05 NOTE — Progress Notes (Signed)
Subjective:  Patient ID: Daniel Myers, male    DOB: 1945/06/18  Age: 75 y.o. MRN: [id number]  CC: Follow-up   HPI [name] presents for presents forFollow-up of diabetes. Patient checks blood sugar at home.   68-114 fasting and 170-320 postprandial Patient denies symptoms such as polyuria, polydipsia, excessive hunger, nausea OVernight hypoglycemic spells noted. Medications reviewed. Pt reports taking short acting insulin 10 units before each meal and NPH insulin 10 units before breakfast and 5 units at bedtime. History [name] has a past medical history of Acute respiratory failure with hypoxia ([place]) (02/12/2020), AKI (acute kidney injury) ([place]) (02/13/2020), Atrial fibrillation with RVR ([place]) (03/11/2020), Basal cell carcinoma (06/27/2013), Basal cell carcinoma (07/01/2014), Basal cell carcinoma (10/10/2014), Basal cell carcinoma (02/11/2015), Basal cell carcinoma (06/14/2016), Basal cell carcinoma (02/22/2017), Basal cell carcinoma (04/11/2018), Chronic kidney disease, History of renal transplant, Hyperlipidemia, Hypertension, NSTEMI (non-ST elevated myocardial infarction) ([place]) (02/13/2020), 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), 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), 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), Squamous cell carcinoma of skin (11/08/2011), Squamous cell carcinoma of skin (11/08/2011), Squamous cell carcinoma of skin (11/08/2011), 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), 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), 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), 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), 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), 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), 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), 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), Squamous cell carcinoma of skin (09/30/2015), Squamous cell carcinoma of skin (02/22/2017), Squamous cell carcinoma of skin (02/22/2017), Squamous cell carcinoma of skin  (04/11/2018), Squamous cell carcinoma of skin (04/11/2018), Squamous cell carcinoma of skin (04/11/2018), Squamous cell carcinoma of skin (04/11/2018), Squamous cell carcinoma of skin (04/11/2018), Squamous cell carcinoma of skin (09/28/2018), Squamous cell carcinoma of skin (09/28/2018), Squamous cell carcinoma of skin (09/28/2018), Squamous cell carcinoma of skin (03/21/2019), Squamous cell carcinoma of skin (03/21/2019), Squamous cell carcinoma of skin (03/21/2019), Type 2 diabetes mellitus ([place]), and Unspecified atrial fibrillation ([place]) (02/17/2020).   He has a past surgical history that includes Kidney transplant ([place]).   His family history includes Clotting disorder in his mother; Diabetes in his sister; Hypertension in his sister.He reports that he has quit smoking. His smoking use included pipe. He has never used smokeless tobacco. He reports that he does not drink alcohol and does not use drugs.    ROS Review of Systems  Constitutional:  Negative for fever.  Respiratory:  Negative for shortness of breath.   Cardiovascular:  Negative for chest pain, palpitations and leg swelling.  Endocrine: Negative for polydipsia and polyphagia.  Musculoskeletal:  Negative for arthralgias.  Skin:  Negative for rash.  Psychiatric/Behavioral:  Negative for confusion.    Objective:  BP 136/67   Pulse 86   Temp (!) 96.8 F (36 C)   Ht '5\' 10"'  (1.778 m)   Wt 189 lb 12.8 oz (86.1 kg)   SpO2 100%   BMI 27.23 kg/m   BP Readings from Last 3 Encounters:  08/05/20 136/67  06/24/20 (!) 141/87  06/06/20 118/72    Wt Readings from Last 3 Encounters:  08/05/20 189 lb 12.8 oz (86.1 kg)  06/24/20 185 lb (83.9 kg)  06/06/20 207 lb 3.2 oz (94 kg)     Physical Exam Vitals reviewed.  Constitutional:      Appearance: He is well-developed.  HENT:     Head: Normocephalic and atraumatic.     Right Ear: External ear normal.     Left Ear: External ear normal.  Mouth/Throat:     Pharynx: No  oropharyngeal exudate or posterior oropharyngeal erythema.  Eyes:     Pupils: Pupils are equal, round, and reactive to light.  Cardiovascular:     Rate and Rhythm: Normal rate and regular rhythm.     Heart sounds: No murmur heard. Pulmonary:     Effort: No respiratory distress.     Breath sounds: Normal breath sounds.  Musculoskeletal:     Cervical back: Normal range of motion and neck supple.  Neurological:     Mental Status: He is alert and oriented to person, place, and time.      Assessment & Plan:   [place] was seen today for follow-up.  Diagnoses and all orders for this visit:  Persistent atrial fibrillation (HCC) -     Bayer DCA Hb A1c Waived -     CBC with Differential/Platelet -     CMP14+EGFR -     Lipid panel  Insulin dependent type 2 diabetes mellitus (HCC) -     insulin NPH Human (NOVOLIN N) 100 UNIT/ML injection; 10 units AC breakfast and 10 units AC supper -     Insulin Regular Human 100 UNIT/ML SOPN; Inject 16 units before breakfast and 10 before supper -     Bayer DCA Hb A1c Waived -     CBC with Differential/Platelet -     CMP14+EGFR -     Lipid panel  I believe the timing of his dose of bedtime insulin is the biggest reason for his overnight lows.  I have asked him to start taking his bedtime insulin at previous suppertime instead.  Additionally he is going to drop the lunchtime short acting in favor of increasing the morning long-acting dose.  He is using Humulin R as his short acting and Humulin and as his long-acting insulins for insurance purposes due to cost.  I have asked that he check his blood sugar 4 times daily fasting, AC, at bedtime for the next month and follow-up at the end of that time to see if we need to do some fine-tuning to his new dosage.    I have changed [name]'s insulin NPH Human and Insulin Regular Human. I am also having him maintain his tacrolimus, sodium bicarbonate, mycophenolate, ReliOn Insulin Syringe, loratadine,  fexofenadine-pseudoephedrine, fluorouracil, blood glucose meter kit and supplies, zinc sulfate, ascorbic acid, albuterol, potassium chloride SA, predniSONE, ondansetron, ALPRAZolam, atorvastatin, fluticasone, canagliflozin, spironolactone, metoprolol tartrate, Eliquis, and furosemide.  Allergies as of 08/05/2020       Reactions   Other Rash   Use paper tape.    Tape Rash   Use paper tape.    Trazodone And Nefazodone Palpitations   tachycardia   Mirtazapine    imbalance   Elemental Sulfur Rash        Medication List        Accurate as of August 05, 2020  2:14 PM. If you have any questions, ask your nurse or doctor.          albuterol 108 (90 Base) MCG/ACT inhaler Commonly known as: VENTOLIN HFA Inhale 2 puffs into the lungs every 6 (six) hours as needed for wheezing or shortness of breath.   ALPRAZolam 0.25 MG tablet Commonly known as: XANAX Take 0.5 mg by mouth at bedtime as needed for anxiety.   ascorbic acid 500 MG tablet Commonly known as: VITAMIN C Take 1 tablet (500 mg total) by mouth daily.   atorvastatin 10 MG tablet Commonly known as: LIPITOR  Take 1 tablet (10 mg total) by mouth daily.   blood glucose meter kit and supplies Dispense based on patient and insurance preference. Use up to four times daily as directed. (FOR ICD-10 E10.9, E11.9).   canagliflozin 300 MG Tabs tablet Commonly known as: Invokana Take 1 tablet (300 mg total) by mouth daily before breakfast.   Eliquis 5 MG Tabs tablet Generic drug: apixaban Take 1 tablet by mouth twice daily   fexofenadine-pseudoephedrine 180-240 MG 24 hr tablet Commonly known as: ALLEGRA-D 24 Take 1 tablet by mouth every evening. For allergy and congestion   fluorouracil 5 % cream Commonly known as: EFUDEX Apply 1 application topically daily.   fluticasone 50 MCG/ACT nasal spray Commonly known as: FLONASE USE ONE SPRAY(S) IN EACH NOSTRIL ONCE DAILY   furosemide 40 MG tablet Commonly known as: LASIX Take  1 tablet by mouth once daily   insulin NPH Human 100 UNIT/ML injection Commonly known as: NOVOLIN N 10 units AC breakfast and 10 units AC supper What changed: additional instructions Changed by: Claretta Fraise, MD   Insulin Regular Human 100 UNIT/ML Sopn Inject 16 units before breakfast and 10 before supper What changed: additional instructions Changed by: Claretta Fraise, MD   loratadine 10 MG tablet Commonly known as: CLARITIN Take 10 mg by mouth daily as needed for allergies.   metoprolol tartrate 50 MG tablet Commonly known as: LOPRESSOR Take 1 tablet by mouth twice daily   mycophenolate 250 MG capsule Commonly known as: CELLCEPT TAKE 2 CAPSULES BY MOUTH TWO TIMES DAILY.   ondansetron 4 MG tablet Commonly known as: ZOFRAN Take 1 tablet (4 mg total) by mouth every 8 (eight) hours as needed for nausea or vomiting.   potassium chloride SA 20 MEQ tablet Commonly known as: KLOR-CON Take 1 tablet (20 mEq total) by mouth daily. For potassium replacement/ supplement   predniSONE 5 MG tablet Commonly known as: DELTASONE Take 5 mg by mouth daily with breakfast.   ReliOn Insulin Syringe 31G X 15/64" 0.5 ML Misc Generic drug: Insulin Syringe-Needle U-100 USE AS DIRECTED FOR INSULIN   sodium bicarbonate 650 MG tablet Take 650 mg by mouth 2 (two) times daily.   spironolactone 25 MG tablet Commonly known as: ALDACTONE Take 0.5 tablets (12.5 mg total) by mouth daily.   tacrolimus 0.5 MG capsule Commonly known as: PROGRAF TAKE 2 CAPSULES BY MOUTH TWO TIMES DAILY.   zinc sulfate 220 (50 Zn) MG capsule Take 1 capsule (220 mg total) by mouth daily.         Follow-up: Return in about 1 month (around 09/04/2020) for diabetes.  Claretta Fraise, M.D.

## 2020-08-06 LAB — CMP14+EGFR
ALT: 11 IU/L (ref 0–44)
AST: 19 IU/L (ref 0–40)
Albumin/Globulin Ratio: 2 (ref 1.2–2.2)
Albumin: 4.3 g/dL (ref 3.7–4.7)
Alkaline Phosphatase: 59 IU/L (ref 44–121)
BUN/Creatinine Ratio: 26 — ABNORMAL HIGH (ref 10–24)
BUN: 45 mg/dL — ABNORMAL HIGH (ref 8–27)
Bilirubin Total: 0.6 mg/dL (ref 0.0–1.2)
CO2: 24 mmol/L (ref 20–29)
Calcium: 9.3 mg/dL (ref 8.6–10.2)
Chloride: 99 mmol/L (ref 96–106)
Creatinine, Ser: 1.7 mg/dL — ABNORMAL HIGH (ref 0.76–1.27)
Globulin, Total: 2.1 g/dL (ref 1.5–4.5)
Glucose: 75 mg/dL (ref 65–99)
Potassium: 5.1 mmol/L (ref 3.5–5.2)
Sodium: 140 mmol/L (ref 134–144)
Total Protein: 6.4 g/dL (ref 6.0–8.5)
eGFR: 42 mL/min/{1.73_m2} — ABNORMAL LOW (ref >59)

## 2020-08-06 LAB — CBC WITH DIFFERENTIAL/PLATELET
Basophils Absolute: 0 10*3/uL (ref 0.0–0.2)
Basos: 0 %
EOS (ABSOLUTE): 0.1 10*3/uL (ref 0.0–0.4)
Eos: 2 %
Hematocrit: 42.6 % (ref 37.5–51.0)
Hemoglobin: 13.8 g/dL (ref 13.0–17.7)
Immature Grans (Abs): 0.1 10*3/uL (ref 0.0–0.1)
Immature Granulocytes: 2 %
Lymphocytes Absolute: 1.5 10*3/uL (ref 0.7–3.1)
Lymphs: 30 %
MCH: 27.9 pg (ref 26.6–33.0)
MCHC: 32.4 g/dL (ref 31.5–35.7)
MCV: 86 fL (ref 79–97)
Monocytes Absolute: 0.3 10*3/uL (ref 0.1–0.9)
Monocytes: 7 %
Neutrophils Absolute: 3 10*3/uL (ref 1.4–7.0)
Neutrophils: 59 %
Platelets: 115 10*3/uL — ABNORMAL LOW (ref 150–450)
RBC: 4.94 x10E6/uL (ref 4.14–5.80)
RDW: 15.1 % (ref 11.6–15.4)
WBC: 5 10*3/uL (ref 3.4–10.8)

## 2020-08-06 LAB — LIPID PANEL
Chol/HDL Ratio: 3.7 ratio (ref 0.0–5.0)
Cholesterol, Total: 149 mg/dL (ref 100–199)
HDL: 40 mg/dL (ref >39)
LDL Chol Calc (NIH): 92 mg/dL (ref 0–99)
Triglycerides: 90 mg/dL (ref 0–149)
VLDL Cholesterol Cal: 17 mg/dL (ref 5–40)

## 2020-08-08 NOTE — Progress Notes (Signed)
Hello Dewain,  Your lab result is normal and/or stable.Some minor variations that are not significant are commonly marked abnormal, but do not represent any medical problem for you.  Best regards, Fara Worthy, M.D.

## 2020-08-12 ENCOUNTER — Other Ambulatory Visit: Payer: Self-pay | Admitting: Family Medicine

## 2020-08-13 ENCOUNTER — Other Ambulatory Visit: Payer: Self-pay | Admitting: Family Medicine

## 2020-08-13 ENCOUNTER — Encounter: Payer: Medicare Other | Admitting: Vascular Surgery

## 2020-08-18 ENCOUNTER — Encounter: Payer: Medicare Other | Admitting: Vascular Surgery

## 2020-08-28 ENCOUNTER — Ambulatory Visit (HOSPITAL_COMMUNITY)
Admission: RE | Admit: 2020-08-28 | Discharge: 2020-08-28 | Disposition: A | Discharge status: Home / Self Care | Payer: Medicare Other | Source: Ambulatory Visit | Attending: Cardiology | Admitting: Cardiology

## 2020-08-28 ENCOUNTER — Encounter (HOSPITAL_COMMUNITY): Payer: Self-pay | Admitting: Cardiology

## 2020-08-28 ENCOUNTER — Other Ambulatory Visit: Payer: Self-pay

## 2020-08-28 VITALS — BP 140/80 | HR 83 | Wt 190.6 lb

## 2020-08-28 DIAGNOSIS — I2510 Atherosclerotic heart disease of native coronary artery without angina pectoris: Secondary | ICD-10-CM | POA: Diagnosis not present

## 2020-08-28 DIAGNOSIS — Z7952 Long term (current) use of systemic steroids: Secondary | ICD-10-CM | POA: Insufficient documentation

## 2020-08-28 DIAGNOSIS — Z940 Kidney transplant status: Secondary | ICD-10-CM | POA: Diagnosis not present

## 2020-08-28 DIAGNOSIS — I5042 Chronic combined systolic (congestive) and diastolic (congestive) heart failure: Secondary | ICD-10-CM | POA: Insufficient documentation

## 2020-08-28 DIAGNOSIS — Z87891 Personal history of nicotine dependence: Secondary | ICD-10-CM | POA: Insufficient documentation

## 2020-08-28 DIAGNOSIS — G4733 Obstructive sleep apnea (adult) (pediatric): Secondary | ICD-10-CM | POA: Diagnosis not present

## 2020-08-28 DIAGNOSIS — I4819 Other persistent atrial fibrillation: Secondary | ICD-10-CM | POA: Diagnosis not present

## 2020-08-28 DIAGNOSIS — I4891 Unspecified atrial fibrillation: Secondary | ICD-10-CM | POA: Diagnosis not present

## 2020-08-28 DIAGNOSIS — E785 Hyperlipidemia, unspecified: Secondary | ICD-10-CM | POA: Diagnosis not present

## 2020-08-28 DIAGNOSIS — Z8249 Family history of ischemic heart disease and other diseases of the circulatory system: Secondary | ICD-10-CM | POA: Insufficient documentation

## 2020-08-28 DIAGNOSIS — Z8616 Personal history of COVID-19: Secondary | ICD-10-CM | POA: Diagnosis not present

## 2020-08-28 DIAGNOSIS — Z833 Family history of diabetes mellitus: Secondary | ICD-10-CM | POA: Insufficient documentation

## 2020-08-28 DIAGNOSIS — I130 Hypertensive heart and chronic kidney disease with heart failure and stage 1 through stage 4 chronic kidney disease, or unspecified chronic kidney disease: Secondary | ICD-10-CM | POA: Diagnosis not present

## 2020-08-28 DIAGNOSIS — E1122 Type 2 diabetes mellitus with diabetic chronic kidney disease: Secondary | ICD-10-CM | POA: Diagnosis not present

## 2020-08-28 DIAGNOSIS — N183 Chronic kidney disease, stage 3 unspecified: Secondary | ICD-10-CM | POA: Diagnosis not present

## 2020-08-28 DIAGNOSIS — Z794 Long term (current) use of insulin: Secondary | ICD-10-CM | POA: Insufficient documentation

## 2020-08-28 DIAGNOSIS — Z7901 Long term (current) use of anticoagulants: Secondary | ICD-10-CM | POA: Diagnosis not present

## 2020-08-28 DIAGNOSIS — I252 Old myocardial infarction: Secondary | ICD-10-CM | POA: Insufficient documentation

## 2020-08-28 DIAGNOSIS — Z79899 Other long term (current) drug therapy: Secondary | ICD-10-CM | POA: Diagnosis not present

## 2020-08-28 LAB — BASIC METABOLIC PANEL
Anion gap: 8 (ref 5–15)
BUN: 48 mg/dL — ABNORMAL HIGH (ref 8–23)
CO2: 26 mmol/L (ref 22–32)
Calcium: 9.3 mg/dL (ref 8.9–10.3)
Chloride: 102 mmol/L (ref 98–111)
Creatinine, Ser: 1.7 mg/dL — ABNORMAL HIGH (ref 0.61–1.24)
GFR, Estimated: 42 mL/min — ABNORMAL LOW (ref >60)
Glucose, Bld: 134 mg/dL — ABNORMAL HIGH (ref 70–99)
Potassium: 5.1 mmol/L (ref 3.5–5.1)
Sodium: 136 mmol/L (ref 135–145)

## 2020-08-28 LAB — CBC
HCT: 44.8 % (ref 39.0–52.0)
Hemoglobin: 14.3 g/dL (ref 13.0–17.0)
MCH: 29 pg (ref 26.0–34.0)
MCHC: 31.9 g/dL (ref 30.0–36.0)
MCV: 90.9 fL (ref 80.0–100.0)
Platelets: 132 10*3/uL — ABNORMAL LOW (ref 150–400)
RBC: 4.93 MIL/uL (ref 4.22–5.81)
RDW: 14.7 % (ref 11.5–15.5)
WBC: 6.5 10*3/uL (ref 4.0–10.5)
nRBC: 0 % (ref 0.0–0.2)

## 2020-08-28 LAB — BRAIN NATRIURETIC PEPTIDE: B Natriuretic Peptide: 1114.6 pg/mL — ABNORMAL HIGH (ref 0.0–100.0)

## 2020-08-28 MED ORDER — FUROSEMIDE 40 MG PO TABS: ORAL_TABLET | ORAL | 11 refills | Status: DC

## 2020-08-28 MED ORDER — EZETIMIBE 10 MG PO TABS: 10.0000 mg | ORAL_TABLET | Freq: Every day | ORAL | 11 refills | Status: DC

## 2020-08-28 MED ORDER — AMIODARONE HCL 200 MG PO TABS: 200.0000 mg | ORAL_TABLET | Freq: Every day | ORAL | 11 refills | Status: DC

## 2020-08-28 MED ORDER — METOPROLOL SUCCINATE ER 50 MG PO TB24: 75.0000 mg | ORAL_TABLET | Freq: Every day | ORAL | 11 refills | Status: DC

## 2020-08-28 NOTE — Progress Notes (Signed)
ReDS Vest / Clip - 08/28/20 1100       ReDS Vest / Clip   Station Marker C    Ruler Value 28    ReDS Value Range Moderate volume overload    ReDS Actual Value 39

## 2020-08-28 NOTE — Patient Instructions (Signed)
EKG done today.  Labs done today. We will contact you only if your labs are abnormal.  STOP Taking Metoprolol  START Toprol XL 75mg  (1 & 1/2 tablets) by mouth daily.  START Zetia 10mg  (1 tablet) by mouth daily.  START Amiodarone 200mg  (1 tablet) by mouth daily.  INCREASE Lasix to 40mg  (1 tablet) by mouth every morning and 20mg  (1/2 tablet) by mouth every evening.   No other medication changes were made. Please continue all current medications as prescribed.  Your physician recommends that you schedule a follow-up appointment in: 3 weeks   If you have any questions or concerns before your next appointment please send [place] a message through [place] or call our office at [phone].    TO LEAVE A MESSAGE FOR THE NURSE SELECT OPTION 2, PLEASE LEAVE A MESSAGE INCLUDING: YOUR NAME DATE OF BIRTH CALL BACK NUMBER REASON FOR CALL**this is important as we prioritize the call backs  YOU WILL RECEIVE A CALL BACK THE SAME DAY AS LONG AS YOU CALL BEFORE 4:00 PM   Do the following things EVERYDAY: Weigh yourself in the morning before breakfast. Write it down and keep it in a log. Take your medicines as prescribed Eat low salt foods--Limit salt (sodium) to 2000 mg per day.  Stay as active as you can everyday Limit all fluids for the day to less than 2 liters   At the [facility], you and your health needs are our priority. As part of our continuing mission to provide you with exceptional heart care, we have created designated Provider Care Teams. These Care Teams include your primary Cardiologist (physician) and Advanced Practice Providers (APPs- Physician Assistants and Nurse Practitioners) who all work together to provide you with the care you need, when you need it.   You may see any of the following providers on your designated Care Team at your next follow up: Dr Glori Bickers Dr Haynes Kerns, NP Lyda Jester, [place] Audry Riles, PharmD   Please be  sure to bring in all your medications bottles to every appointment.     You are scheduled for a Cardiac Catheterization on Tuesday, August 2 with Dr. Loralie Champagne.  1. Please arrive at the [facility] (Main Entrance A) at [facility]: [address] [place], [place] [address] at 7:00 AM (This time is two hours before your procedure to ensure your preparation). Free valet parking service is available.   Special note: Every effort is made to have your procedure done on time. Please understand that emergencies sometimes delay scheduled procedures.  2. Diet: Do not eat solid foods after midnight.  The patient may have clear liquids until 5am upon the day of the procedure.  3. Medication instructions in preparation for your procedure:   DO NOT take your Eliquis the day before and the day of your procedure.  DO NOT take your Lasix the day of your procedure.  Take only 5 units of insulin the night before your procedure. Do not take any insulin on the day of the procedure.  On the morning of your procedure, any morning medicines NOT listed above.  You may use sips of water.  5. Plan for one night stay--bring personal belongings. 6. Bring a current list of your medications and current insurance cards. 7. You MUST have a responsible person to drive you home. 8. Someone MUST be with you the first 24 hours after you arrive home or your discharge will be delayed. 9.  Please wear clothes that are easy to get on and off and wear slip-on shoes.  Thank you for allowing [place] to care for you!   -- [facility]

## 2020-08-29 NOTE — H&P (View-Only) (Signed)
PCP: Claretta Fraise, MD Cardiology: Dr. Domenic Polite HF Cardiology: Dr. Aundra Dubin  75 y.o. with history of suspected CAD/NSTEMI, CKD stage 3 s/p renal transplant, chronic systolic CHF, and persistent atrial fibrillation was referred by Dr. Domenic Polite for evaluation of CHF, atrial fibrillation, and suspected CAD.  Patient was doing fairly well until 1/22.  He developed COVID-19 PNA.  Hospitalization was complicated by NSTEMI and the onset of atrial fibrillation.  Cath was not done with elevated creatinine.  Echo in 1/22 showed EF 40-45% with wall motion abnormalities.  Cardiolite done as an outpatient in 3/22 showed EF 21%, inferior and inferolateral fixed defect.    Patient had CVA in 2/22, likely related to atrial fibrillation.  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  He did PT.   Repeat echo in 4/22 showed EF down further to 25-30%.   Patient walks with cane or walker for balance.  No dyspnea walking around the house or doing ADLs, he is short of breath walking longer distances.  No orthopnea/PND.  +Bendopnea.  No chest pain.  No lightheadedness/syncope.  He does not feels palpitations. He remains in atrial fibrillation today.   ECG (personally reviewed): NSR, anterolateral Qs, inferior Qs  Labs (6/22): LDL 92, HDL 40, K 5.1, creatinine 1.7  REDs clip 39%  PMH: 1. Type 2 diabetes 2. HTN 3. Hyperlipidemia 4. H/o COVID-19 PNA in 1/22.  5. CKD: Stage 3.  History of renal transplant.   6. 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  7. Thrombocytopenia 8. Skin cancer 9. OSA 10. CAD: NSTEMI in 1/22 during admission for COVID PNA.  HS-TnI to 8647.  Not cathed with elevated creatinine.  - Cardiolite (3/22): EF 21%, inferior and inferolateral fixed defect, apical fixed defect.  11. Chronic systolic CHF: Suspicious for ischemic cardiomyopathy.  - Echo (1/22): EF 40-45%, wall motion abnormalities.  - Echo (4/22): EF 25-30%, global hypokinesis.  12. Atrial  fibrillation: Persistent since 1/22.   Social History   Socioeconomic History   Marital status: Married    Spouse name: Not on file   Number of children: Not on file   Years of education: Not on file   Highest education level: Not on file  Occupational History   Not on file  Tobacco Use   Smoking status: Former    Types: Pipe   Smokeless tobacco: Never  Vaping Use   Vaping Use: Never used  Substance and Sexual Activity   Alcohol use: Never   Drug use: Never   Sexual activity: Not on file  Other Topics Concern   Not on file  Social History Narrative   Not on file   Social Determinants of Health   Financial Resource Strain: Low Risk    Difficulty of Paying Living Expenses: Not hard at all  Food Insecurity: No Food Insecurity   Worried About Charity fundraiser in the Last Year: Never true   [place] in the Last Year: Never true  Transportation Needs: No Transportation Needs   Lack of Transportation (Medical): No   Lack of Transportation (Non-Medical): No  Physical Activity: Inactive   Days of Exercise per Week: 0 days   Minutes of Exercise per Session: 0 min  Stress: Not on file  Social Connections: Socially Isolated   Frequency of Communication with Friends and Family: More than three times a week   Frequency of Social Gatherings with Friends and Family: More than three times a week  Attends Religious Services: Never   Active Member of Clubs or Organizations: No   Attends Archivist Meetings: Never   Marital Status: Widowed  Human resources officer Violence: Not on file   Family History  Problem Relation Age of Onset   Clotting disorder Mother    Hypertension Sister    Diabetes Sister    ROS: All systems reviewed and negative except as per HPI.   Current Outpatient Medications  Medication Sig Dispense Refill   albuterol (VENTOLIN HFA) 108 (90 Base) MCG/ACT inhaler Inhale 2 puffs into the lungs every 6 (six) hours as needed for wheezing or  shortness of breath. 1 each 3   ALPRAZolam (XANAX) 0.25 MG tablet Take 0.5 mg by mouth at bedtime as needed for anxiety.     amiodarone (PACERONE) 200 MG tablet Take 1 tablet (200 mg total) by mouth daily. 30 tablet 11   ascorbic acid (VITAMIN C) 500 MG tablet Take 1 tablet (500 mg total) by mouth daily.     atorvastatin (LIPITOR) 10 MG tablet Take 1 tablet (10 mg total) by mouth daily. 90 tablet 1   blood glucose meter kit and supplies Dispense based on patient and insurance preference. Use up to four times daily as directed. (FOR ICD-10 E10.9, E11.9). 1 each 0   ELIQUIS 5 MG TABS tablet Take 1 tablet by mouth twice daily 60 tablet 0   ezetimibe (ZETIA) 10 MG tablet Take 1 tablet (10 mg total) by mouth daily. 30 tablet 11   fexofenadine-pseudoephedrine (ALLEGRA-D 24) 180-240 MG 24 hr tablet Take 1 tablet by mouth every evening. For allergy and congestion 30 tablet 11   fluorouracil (EFUDEX) 5 % cream Apply 1 application topically daily.     fluticasone (FLONASE) 50 MCG/ACT nasal spray USE ONE SPRAY(S) IN EACH NOSTRIL ONCE DAILY 16 g 11   insulin NPH Human (NOVOLIN N) 100 UNIT/ML injection 10 units AC breakfast and 10 units AC supper 30 mL 11   Insulin Regular Human 100 UNIT/ML SOPN Inject 16 units before breakfast and 10 before supper 10 mL prn   INVOKANA 300 MG TABS tablet TAKE 1 TABLET BY MOUTH ONCE DAILY BEFORE BREAKFAST 30 tablet 0   loratadine (CLARITIN) 10 MG tablet Take 10 mg by mouth daily as needed for allergies.     metoprolol succinate (TOPROL XL) 50 MG 24 hr tablet Take 1.5 tablets (75 mg total) by mouth daily. Take with or immediately following a meal. 45 tablet 11   mycophenolate (CELLCEPT) 250 MG capsule TAKE 2 CAPSULES BY MOUTH TWO TIMES DAILY.     ondansetron (ZOFRAN) 4 MG tablet Take 1 tablet (4 mg total) by mouth every 8 (eight) hours as needed for nausea or vomiting. 20 tablet 0   potassium chloride SA (KLOR-CON) 20 MEQ tablet Take 1 tablet (20 mEq total) by mouth daily. For  potassium replacement/ supplement 30 tablet 5   predniSONE (DELTASONE) 5 MG tablet Take 5 mg by mouth daily with breakfast.     RELION INSULIN SYRINGE 31G X 15/64" 0.5 ML MISC USE AS DIRECTED FOR INSULIN     sodium bicarbonate 650 MG tablet Take 650 mg by mouth 2 (two) times daily.     spironolactone (ALDACTONE) 25 MG tablet Take 0.5 tablets (12.5 mg total) by mouth daily.     tacrolimus (PROGRAF) 0.5 MG capsule TAKE 2 CAPSULES BY MOUTH TWO TIMES DAILY.     zinc sulfate 220 (50 Zn) MG capsule Take 1 capsule (220 mg total) by mouth  daily.     furosemide (LASIX) 40 MG tablet Take 1 tablet (40 mg total) by mouth every morning AND 0.5 tablets (20 mg total) every evening. 45 tablet 11   No current facility-administered medications for this encounter.   BP 140/80   Pulse 83   Wt 86.5 kg (190 lb 9.6 oz)   SpO2 100%   BMI 27.35 kg/m  General: NAD Neck: JVP 8 cm with HJR, no thyromegaly or thyroid nodule.  Lungs: Clear to auscultation bilaterally with normal respiratory effort. CV: Nondisplaced PMI.  Heart irregular S1/S2, no S3/S4, no murmur.  Trace ankle edema.  No carotid bruit.  Normal pedal pulses.  Abdomen: Soft, nontender, no hepatosplenomegaly, no distention.  Skin: Intact without lesions or rashes.  Neurologic: Alert and oriented x 3.  Psych: Normal affect. Extremities: No clubbing or cyanosis.  HEENT: Normal.   Assessment/Plan: 1. Chronic systolic CHF: Echo in 2/42 with EF 25-30%.  There has been some regionality to the echo, suggesting possible ischemic cardiomyopathy.   Cardiolite in 3/22 also was suggestive of prior infarction, possible multivessel disease with inferior/inferolateral and apical fixed defects.  Patient had NSTEMI during 1/22 admission with COVID-19, not cathed with CKD.  On exam today, he is mildly volume overloaded with NYHA class II-III symptoms.  REDS clip mildly elevated at 39%.  - Increase Lasix to 40 qam/20 qpm.  BMET/BNP today and BMET next week.  - Stop  metoprolol tartrate, start Toprol XL 75 mg daily.  - Continue spironolactone 12.5 daily.  - Continue canagliflozin.   - If creatinine remains stable, will start Entresto next, but would wait until after cath.  - As long as creatinine remains stable around 1.7 today, I will arrange for LHC/RHC next week to assess filling pressures, cardiac output and for CAD (strong suspicion).  Hold Eliquis day before cath and day of.  Discussed risks/benefits with patient and he agrees to procedure.  2. Atrial fibrillation: Persistent since 1/22.  No attempt at DCCV as of yet. He would likely benefit from NSR given CHF.  -  He will need to hold Eliquis transiently for cath, so will arrange TEE-guided DCCV after cath completed and he is back on Eliquis.   -  Given CHF and prolonged atrial fibrillation, think he will need to start amiodarone to give him the best chance of holding NSR.  With his renal transplant meds, will only use amiodarone 200 mg daily and allow slow load.  -  Consider AF ablation eventually if he is able to regain NSR.  This may allow [place] to stop amiodarone.  3. 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  4. CKD: stage 3.  Post-transplant.  Follow closely pre-cath. Limit contrast.  5. Hyperlipidemia: He is on atorvastatin 10 mg daily, cannot increase with renal transplant meds. LDL is above goal.  - Add Zetia 10 mg daily with lipids in 2 months.  If LDL still too high, send to lipid clinic for [place].   Loralie Champagne 08/29/2020

## 2020-08-29 NOTE — Progress Notes (Signed)
PCP: Claretta Fraise, MD Cardiology: Dr. Domenic Polite HF Cardiology: Dr. Aundra Dubin  75 y.o. with history of suspected CAD/NSTEMI, CKD stage 3 s/p renal transplant, chronic systolic CHF, and persistent atrial fibrillation was referred by Dr. Domenic Polite for evaluation of CHF, atrial fibrillation, and suspected CAD.  Patient was doing fairly well until 1/22.  He developed COVID-19 PNA.  Hospitalization was complicated by NSTEMI and the onset of atrial fibrillation.  Cath was not done with elevated creatinine.  Echo in 1/22 showed EF 40-45% with wall motion abnormalities.  Cardiolite done as an outpatient in 3/22 showed EF 21%, inferior and inferolateral fixed defect.    Patient had CVA in 2/22, likely related to atrial fibrillation.  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  He did PT.   Repeat echo in 4/22 showed EF down further to 25-30%.   Patient walks with cane or walker for balance.  No dyspnea walking around the house or doing ADLs, he is short of breath walking longer distances.  No orthopnea/PND.  +Bendopnea.  No chest pain.  No lightheadedness/syncope.  He does not feels palpitations. He remains in atrial fibrillation today.   ECG (personally reviewed): NSR, anterolateral Qs, inferior Qs  Labs (6/22): LDL 92, HDL 40, K 5.1, creatinine 1.7  REDs clip 39%  PMH: 1. Type 2 diabetes 2. HTN 3. Hyperlipidemia 4. H/o COVID-19 PNA in 1/22.  5. CKD: Stage 3.  History of renal transplant.   6. 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  7. Thrombocytopenia 8. Skin cancer 9. OSA 10. CAD: NSTEMI in 1/22 during admission for COVID PNA.  HS-TnI to 8647.  Not cathed with elevated creatinine.  - Cardiolite (3/22): EF 21%, inferior and inferolateral fixed defect, apical fixed defect.  11. Chronic systolic CHF: Suspicious for ischemic cardiomyopathy.  - Echo (1/22): EF 40-45%, wall motion abnormalities.  - Echo (4/22): EF 25-30%, global hypokinesis.  12. Atrial  fibrillation: Persistent since 1/22.   Social History   Socioeconomic History   Marital status: Married    Spouse name: Not on file   Number of children: Not on file   Years of education: Not on file   Highest education level: Not on file  Occupational History   Not on file  Tobacco Use   Smoking status: Former    Types: Pipe   Smokeless tobacco: Never  Vaping Use   Vaping Use: Never used  Substance and Sexual Activity   Alcohol use: Never   Drug use: Never   Sexual activity: Not on file  Other Topics Concern   Not on file  Social History Narrative   Not on file   Social Determinants of Health   Financial Resource Strain: Low Risk    Difficulty of Paying Living Expenses: Not hard at all  Food Insecurity: No Food Insecurity   Worried About Charity fundraiser in the Last Year: Never true   [place] in the Last Year: Never true  Transportation Needs: No Transportation Needs   Lack of Transportation (Medical): No   Lack of Transportation (Non-Medical): No  Physical Activity: Inactive   Days of Exercise per Week: 0 days   Minutes of Exercise per Session: 0 min  Stress: Not on file  Social Connections: Socially Isolated   Frequency of Communication with Friends and Family: More than three times a week   Frequency of Social Gatherings with Friends and Family: More than three times a week  Attends Religious Services: Never   Active Member of Clubs or Organizations: No   Attends Archivist Meetings: Never   Marital Status: Widowed  Human resources officer Violence: Not on file   Family History  Problem Relation Age of Onset   Clotting disorder Mother    Hypertension Sister    Diabetes Sister    ROS: All systems reviewed and negative except as per HPI.   Current Outpatient Medications  Medication Sig Dispense Refill   albuterol (VENTOLIN HFA) 108 (90 Base) MCG/ACT inhaler Inhale 2 puffs into the lungs every 6 (six) hours as needed for wheezing or  shortness of breath. 1 each 3   ALPRAZolam (XANAX) 0.25 MG tablet Take 0.5 mg by mouth at bedtime as needed for anxiety.     amiodarone (PACERONE) 200 MG tablet Take 1 tablet (200 mg total) by mouth daily. 30 tablet 11   ascorbic acid (VITAMIN C) 500 MG tablet Take 1 tablet (500 mg total) by mouth daily.     atorvastatin (LIPITOR) 10 MG tablet Take 1 tablet (10 mg total) by mouth daily. 90 tablet 1   blood glucose meter kit and supplies Dispense based on patient and insurance preference. Use up to four times daily as directed. (FOR ICD-10 E10.9, E11.9). 1 each 0   ELIQUIS 5 MG TABS tablet Take 1 tablet by mouth twice daily 60 tablet 0   ezetimibe (ZETIA) 10 MG tablet Take 1 tablet (10 mg total) by mouth daily. 30 tablet 11   fexofenadine-pseudoephedrine (ALLEGRA-D 24) 180-240 MG 24 hr tablet Take 1 tablet by mouth every evening. For allergy and congestion 30 tablet 11   fluorouracil (EFUDEX) 5 % cream Apply 1 application topically daily.     fluticasone (FLONASE) 50 MCG/ACT nasal spray USE ONE SPRAY(S) IN EACH NOSTRIL ONCE DAILY 16 g 11   insulin NPH Human (NOVOLIN N) 100 UNIT/ML injection 10 units AC breakfast and 10 units AC supper 30 mL 11   Insulin Regular Human 100 UNIT/ML SOPN Inject 16 units before breakfast and 10 before supper 10 mL prn   INVOKANA 300 MG TABS tablet TAKE 1 TABLET BY MOUTH ONCE DAILY BEFORE BREAKFAST 30 tablet 0   loratadine (CLARITIN) 10 MG tablet Take 10 mg by mouth daily as needed for allergies.     metoprolol succinate (TOPROL XL) 50 MG 24 hr tablet Take 1.5 tablets (75 mg total) by mouth daily. Take with or immediately following a meal. 45 tablet 11   mycophenolate (CELLCEPT) 250 MG capsule TAKE 2 CAPSULES BY MOUTH TWO TIMES DAILY.     ondansetron (ZOFRAN) 4 MG tablet Take 1 tablet (4 mg total) by mouth every 8 (eight) hours as needed for nausea or vomiting. 20 tablet 0   potassium chloride SA (KLOR-CON) 20 MEQ tablet Take 1 tablet (20 mEq total) by mouth daily. For  potassium replacement/ supplement 30 tablet 5   predniSONE (DELTASONE) 5 MG tablet Take 5 mg by mouth daily with breakfast.     RELION INSULIN SYRINGE 31G X 15/64" 0.5 ML MISC USE AS DIRECTED FOR INSULIN     sodium bicarbonate 650 MG tablet Take 650 mg by mouth 2 (two) times daily.     spironolactone (ALDACTONE) 25 MG tablet Take 0.5 tablets (12.5 mg total) by mouth daily.     tacrolimus (PROGRAF) 0.5 MG capsule TAKE 2 CAPSULES BY MOUTH TWO TIMES DAILY.     zinc sulfate 220 (50 Zn) MG capsule Take 1 capsule (220 mg total) by mouth  daily.     furosemide (LASIX) 40 MG tablet Take 1 tablet (40 mg total) by mouth every morning AND 0.5 tablets (20 mg total) every evening. 45 tablet 11   No current facility-administered medications for this encounter.   BP 140/80   Pulse 83   Wt 86.5 kg (190 lb 9.6 oz)   SpO2 100%   BMI 27.35 kg/m  General: NAD Neck: JVP 8 cm with HJR, no thyromegaly or thyroid nodule.  Lungs: Clear to auscultation bilaterally with normal respiratory effort. CV: Nondisplaced PMI.  Heart irregular S1/S2, no S3/S4, no murmur.  Trace ankle edema.  No carotid bruit.  Normal pedal pulses.  Abdomen: Soft, nontender, no hepatosplenomegaly, no distention.  Skin: Intact without lesions or rashes.  Neurologic: Alert and oriented x 3.  Psych: Normal affect. Extremities: No clubbing or cyanosis.  HEENT: Normal.   Assessment/Plan: 1. Chronic systolic CHF: Echo in 2/42 with EF 25-30%.  There has been some regionality to the echo, suggesting possible ischemic cardiomyopathy.   Cardiolite in 3/22 also was suggestive of prior infarction, possible multivessel disease with inferior/inferolateral and apical fixed defects.  Patient had NSTEMI during 1/22 admission with COVID-19, not cathed with CKD.  On exam today, he is mildly volume overloaded with NYHA class II-III symptoms.  REDS clip mildly elevated at 39%.  - Increase Lasix to 40 qam/20 qpm.  BMET/BNP today and BMET next week.  - Stop  metoprolol tartrate, start Toprol XL 75 mg daily.  - Continue spironolactone 12.5 daily.  - Continue canagliflozin.   - If creatinine remains stable, will start Entresto next, but would wait until after cath.  - As long as creatinine remains stable around 1.7 today, I will arrange for LHC/RHC next week to assess filling pressures, cardiac output and for CAD (strong suspicion).  Hold Eliquis day before cath and day of.  Discussed risks/benefits with patient and he agrees to procedure.  2. Atrial fibrillation: Persistent since 1/22.  No attempt at DCCV as of yet. He would likely benefit from NSR given CHF.  -  He will need to hold Eliquis transiently for cath, so will arrange TEE-guided DCCV after cath completed and he is back on Eliquis.   -  Given CHF and prolonged atrial fibrillation, think he will need to start amiodarone to give him the best chance of holding NSR.  With his renal transplant meds, will only use amiodarone 200 mg daily and allow slow load.  -  Consider AF ablation eventually if he is able to regain NSR.  This may allow [place] to stop amiodarone.  3. 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  4. CKD: stage 3.  Post-transplant.  Follow closely pre-cath. Limit contrast.  5. Hyperlipidemia: He is on atorvastatin 10 mg daily, cannot increase with renal transplant meds. LDL is above goal.  - Add Zetia 10 mg daily with lipids in 2 months.  If LDL still too high, send to lipid clinic for [place].   Loralie Champagne 08/29/2020

## 2020-09-04 ENCOUNTER — Other Ambulatory Visit: Payer: Self-pay

## 2020-09-04 ENCOUNTER — Other Ambulatory Visit (HOSPITAL_COMMUNITY): Payer: Self-pay | Admitting: Surgery

## 2020-09-04 ENCOUNTER — Other Ambulatory Visit (HOSPITAL_COMMUNITY): Payer: Self-pay | Admitting: *Deleted

## 2020-09-04 ENCOUNTER — Other Ambulatory Visit: Payer: Medicare Other

## 2020-09-04 DIAGNOSIS — I5042 Chronic combined systolic (congestive) and diastolic (congestive) heart failure: Secondary | ICD-10-CM

## 2020-09-05 LAB — BASIC METABOLIC PANEL
BUN/Creatinine Ratio: 28 — ABNORMAL HIGH (ref 10–24)
BUN: 49 mg/dL — ABNORMAL HIGH (ref 8–27)
CO2: 24 mmol/L (ref 20–29)
Calcium: 8.9 mg/dL (ref 8.6–10.2)
Chloride: 100 mmol/L (ref 96–106)
Creatinine, Ser: 1.77 mg/dL — ABNORMAL HIGH (ref 0.76–1.27)
Glucose: 97 mg/dL (ref 65–99)
Potassium: 4.7 mmol/L (ref 3.5–5.2)
Sodium: 142 mmol/L (ref 134–144)
eGFR: 40 mL/min/{1.73_m2} — ABNORMAL LOW (ref >59)

## 2020-09-08 ENCOUNTER — Other Ambulatory Visit: Payer: Self-pay | Admitting: Family Medicine

## 2020-09-09 ENCOUNTER — Ambulatory Visit (HOSPITAL_COMMUNITY)
Admission: RE | Admit: 2020-09-09 | Discharge: 2020-09-09 | Disposition: A | Discharge status: Home / Self Care | Payer: Medicare Other | Attending: Cardiology | Admitting: Cardiology

## 2020-09-09 ENCOUNTER — Encounter (HOSPITAL_COMMUNITY)
Admission: RE | Disposition: A | Discharge status: Home / Self Care | Payer: Self-pay | Source: Home / Self Care | Attending: Cardiology

## 2020-09-09 ENCOUNTER — Encounter (HOSPITAL_COMMUNITY): Payer: Self-pay | Admitting: Cardiology

## 2020-09-09 DIAGNOSIS — Z794 Long term (current) use of insulin: Secondary | ICD-10-CM | POA: Insufficient documentation

## 2020-09-09 DIAGNOSIS — I252 Old myocardial infarction: Secondary | ICD-10-CM | POA: Diagnosis not present

## 2020-09-09 DIAGNOSIS — Z87891 Personal history of nicotine dependence: Secondary | ICD-10-CM | POA: Insufficient documentation

## 2020-09-09 DIAGNOSIS — Z7901 Long term (current) use of anticoagulants: Secondary | ICD-10-CM | POA: Diagnosis not present

## 2020-09-09 DIAGNOSIS — N183 Chronic kidney disease, stage 3 unspecified: Secondary | ICD-10-CM | POA: Diagnosis not present

## 2020-09-09 DIAGNOSIS — Z8673 Personal history of transient ischemic attack (TIA), and cerebral infarction without residual deficits: Secondary | ICD-10-CM | POA: Diagnosis not present

## 2020-09-09 DIAGNOSIS — I2510 Atherosclerotic heart disease of native coronary artery without angina pectoris: Secondary | ICD-10-CM | POA: Diagnosis not present

## 2020-09-09 DIAGNOSIS — E1122 Type 2 diabetes mellitus with diabetic chronic kidney disease: Secondary | ICD-10-CM | POA: Insufficient documentation

## 2020-09-09 DIAGNOSIS — Z8616 Personal history of COVID-19: Secondary | ICD-10-CM | POA: Diagnosis not present

## 2020-09-09 DIAGNOSIS — I429 Cardiomyopathy, unspecified: Secondary | ICD-10-CM | POA: Diagnosis not present

## 2020-09-09 DIAGNOSIS — I130 Hypertensive heart and chronic kidney disease with heart failure and stage 1 through stage 4 chronic kidney disease, or unspecified chronic kidney disease: Secondary | ICD-10-CM | POA: Diagnosis not present

## 2020-09-09 DIAGNOSIS — Z833 Family history of diabetes mellitus: Secondary | ICD-10-CM | POA: Diagnosis not present

## 2020-09-09 DIAGNOSIS — G4733 Obstructive sleep apnea (adult) (pediatric): Secondary | ICD-10-CM | POA: Diagnosis not present

## 2020-09-09 DIAGNOSIS — E785 Hyperlipidemia, unspecified: Secondary | ICD-10-CM | POA: Diagnosis not present

## 2020-09-09 DIAGNOSIS — Z7952 Long term (current) use of systemic steroids: Secondary | ICD-10-CM | POA: Diagnosis not present

## 2020-09-09 DIAGNOSIS — I4819 Other persistent atrial fibrillation: Secondary | ICD-10-CM | POA: Insufficient documentation

## 2020-09-09 DIAGNOSIS — Z85828 Personal history of other malignant neoplasm of skin: Secondary | ICD-10-CM | POA: Insufficient documentation

## 2020-09-09 DIAGNOSIS — Z8249 Family history of ischemic heart disease and other diseases of the circulatory system: Secondary | ICD-10-CM | POA: Diagnosis not present

## 2020-09-09 DIAGNOSIS — I5022 Chronic systolic (congestive) heart failure: Secondary | ICD-10-CM | POA: Diagnosis not present

## 2020-09-09 DIAGNOSIS — Z79899 Other long term (current) drug therapy: Secondary | ICD-10-CM | POA: Diagnosis not present

## 2020-09-09 DIAGNOSIS — Z940 Kidney transplant status: Secondary | ICD-10-CM | POA: Diagnosis not present

## 2020-09-09 HISTORY — PX: RIGHT/LEFT HEART CATH AND CORONARY ANGIOGRAPHY: CATH118266

## 2020-09-09 LAB — POCT I-STAT EG7
Acid-Base Excess: 0 mmol/L (ref 0.0–2.0)
Acid-Base Excess: 1 mmol/L (ref 0.0–2.0)
Bicarbonate: 26.5 mmol/L (ref 20.0–28.0)
Bicarbonate: 27 mmol/L (ref 20.0–28.0)
Calcium, Ion: 1.15 mmol/L (ref 1.15–1.40)
Calcium, Ion: 1.18 mmol/L (ref 1.15–1.40)
HCT: 41 % (ref 39.0–52.0)
HCT: 42 % (ref 39.0–52.0)
Hemoglobin: 13.9 g/dL (ref 13.0–17.0)
Hemoglobin: 14.3 g/dL (ref 13.0–17.0)
O2 Saturation: 68 %
O2 Saturation: 69 %
Potassium: 4.6 mmol/L (ref 3.5–5.1)
Potassium: 4.7 mmol/L (ref 3.5–5.1)
Sodium: 140 mmol/L (ref 135–145)
Sodium: 140 mmol/L (ref 135–145)
TCO2: 28 mmol/L (ref 22–32)
TCO2: 28 mmol/L (ref 22–32)
pCO2, Ven: 48.6 mmHg (ref 44.0–60.0)
pCO2, Ven: 48.9 mmHg (ref 44.0–60.0)
pH, Ven: 7.344 (ref 7.250–7.430)
pH, Ven: 7.35 (ref 7.250–7.430)
pO2, Ven: 38 mmHg (ref 32.0–45.0)
pO2, Ven: 39 mmHg (ref 32.0–45.0)

## 2020-09-09 LAB — GLUCOSE, CAPILLARY
Glucose-Capillary: 186 mg/dL — ABNORMAL HIGH (ref 70–99)
Glucose-Capillary: 171 mg/dL — ABNORMAL HIGH (ref 70–99)

## 2020-09-09 LAB — BASIC METABOLIC PANEL
Anion gap: 10 (ref 5–15)
BUN: 51 mg/dL — ABNORMAL HIGH (ref 8–23)
CO2: 23 mmol/L (ref 22–32)
Calcium: 9 mg/dL (ref 8.9–10.3)
Chloride: 104 mmol/L (ref 98–111)
Creatinine, Ser: 1.93 mg/dL — ABNORMAL HIGH (ref 0.61–1.24)
GFR, Estimated: 36 mL/min — ABNORMAL LOW (ref >60)
Glucose, Bld: 177 mg/dL — ABNORMAL HIGH (ref 70–99)
Potassium: 4.7 mmol/L (ref 3.5–5.1)
Sodium: 137 mmol/L (ref 135–145)

## 2020-09-09 LAB — POCT I-STAT, CHEM 8
BUN: 50 mg/dL — ABNORMAL HIGH (ref 8–23)
Calcium, Ion: 1.11 mmol/L — ABNORMAL LOW (ref 1.15–1.40)
Chloride: 104 mmol/L (ref 98–111)
Creatinine, Ser: 1.9 mg/dL — ABNORMAL HIGH (ref 0.61–1.24)
Glucose, Bld: 186 mg/dL — ABNORMAL HIGH (ref 70–99)
HCT: 46 % (ref 39.0–52.0)
Hemoglobin: 15.6 g/dL (ref 13.0–17.0)
Potassium: 4.7 mmol/L (ref 3.5–5.1)
Sodium: 139 mmol/L (ref 135–145)
TCO2: 26 mmol/L (ref 22–32)

## 2020-09-09 SURGERY — RIGHT/LEFT HEART CATH AND CORONARY ANGIOGRAPHY
Anesthesia: LOCAL

## 2020-09-09 MED ORDER — SODIUM CHLORIDE 0.9% FLUSH: 3.0000 mL | INTRAVENOUS | Status: DC | PRN

## 2020-09-09 MED ORDER — ASPIRIN 81 MG PO CHEW
CHEWABLE_TABLET | ORAL | Status: AC
  Administered 2020-09-09: 81 mg via ORAL
  Filled 2020-09-09: qty 1

## 2020-09-09 MED ORDER — SODIUM CHLORIDE 0.9% FLUSH: 3.0000 mL | Freq: Two times a day (BID) | INTRAVENOUS | Status: DC

## 2020-09-09 MED ORDER — LIDOCAINE HCL (PF) 1 % IJ SOLN
INTRAMUSCULAR | Status: DC | PRN
  Administered 2020-09-09: 10 mL

## 2020-09-09 MED ORDER — MIDAZOLAM HCL 2 MG/2ML IJ SOLN
INTRAMUSCULAR | Status: DC | PRN
  Administered 2020-09-09: 1 mg via INTRAVENOUS

## 2020-09-09 MED ORDER — HEPARIN (PORCINE) IN NACL 1000-0.9 UT/500ML-% IV SOLN
INTRAVENOUS | Status: AC
  Filled 2020-09-09: qty 1000

## 2020-09-09 MED ORDER — FENTANYL CITRATE (PF) 100 MCG/2ML IJ SOLN
INTRAMUSCULAR | Status: DC | PRN
  Administered 2020-09-09: 25 ug via INTRAVENOUS

## 2020-09-09 MED ORDER — LIDOCAINE HCL (PF) 1 % IJ SOLN
INTRAMUSCULAR | Status: AC
  Filled 2020-09-09: qty 30

## 2020-09-09 MED ORDER — LABETALOL HCL 5 MG/ML IV SOLN: 10.0000 mg | INTRAVENOUS | Status: DC | PRN

## 2020-09-09 MED ORDER — SODIUM CHLORIDE 0.9 % IV SOLN: 250.0000 mL | INTRAVENOUS | Status: DC | PRN

## 2020-09-09 MED ORDER — IOHEXOL 350 MG/ML SOLN
INTRAVENOUS | Status: DC | PRN
  Administered 2020-09-09: 45 mL

## 2020-09-09 MED ORDER — ACETAMINOPHEN 325 MG PO TABS: 650.0000 mg | ORAL_TABLET | ORAL | Status: DC | PRN

## 2020-09-09 MED ORDER — ASPIRIN 81 MG PO CHEW: 81.0000 mg | CHEWABLE_TABLET | ORAL | Status: AC

## 2020-09-09 MED ORDER — SODIUM CHLORIDE 0.9 % IV SOLN: INTRAVENOUS | Status: DC

## 2020-09-09 MED ORDER — HEPARIN (PORCINE) IN NACL 1000-0.9 UT/500ML-% IV SOLN
INTRAVENOUS | Status: DC | PRN
  Administered 2020-09-09 (×2): 500 mL

## 2020-09-09 MED ORDER — HYDRALAZINE HCL 20 MG/ML IJ SOLN: 10.0000 mg | INTRAMUSCULAR | Status: DC | PRN

## 2020-09-09 MED ORDER — APIXABAN 5 MG PO TABS: 5.0000 mg | ORAL_TABLET | Freq: Two times a day (BID) | ORAL | 1 refills | Status: DC

## 2020-09-09 MED ORDER — MIDAZOLAM HCL 2 MG/2ML IJ SOLN
INTRAMUSCULAR | Status: AC
  Filled 2020-09-09: qty 2

## 2020-09-09 MED ORDER — FENTANYL CITRATE (PF) 100 MCG/2ML IJ SOLN
INTRAMUSCULAR | Status: AC
  Filled 2020-09-09: qty 2

## 2020-09-09 MED ORDER — ONDANSETRON HCL 4 MG/2ML IJ SOLN: 4.0000 mg | Freq: Four times a day (QID) | INTRAMUSCULAR | Status: DC | PRN

## 2020-09-09 SURGICAL SUPPLY — 9 items
CATH INFINITI 5 FR 3DRC (CATHETERS) ×2 IMPLANT
CATH INFINITI 5FR MULTPACK ANG (CATHETERS) ×2 IMPLANT
CATH SWAN GANZ 7F STRAIGHT (CATHETERS) ×2 IMPLANT
KIT HEART LEFT (KITS) ×2 IMPLANT
PACK CARDIAC CATHETERIZATION (CUSTOM PROCEDURE TRAY) ×2 IMPLANT
SHEATH PINNACLE 5F 10CM (SHEATH) ×2 IMPLANT
SHEATH PINNACLE 7F 10CM (SHEATH) ×2 IMPLANT
TRANSDUCER W/STOPCOCK (MISCELLANEOUS) ×2 IMPLANT
WIRE EMERALD 3MM-J .035X150CM (WIRE) ×2 IMPLANT

## 2020-09-09 NOTE — Discharge Instructions (Addendum)
1. Restart Eliquis tomorrow morning.  2. We will arrange a cardiac surgery appointment within the next week.

## 2020-09-09 NOTE — Progress Notes (Signed)
Client refused in and out cath and refused to stay in bed and stood at bedside to void; right groin stable, no bleeding or hematoma

## 2020-09-09 NOTE — Progress Notes (Addendum)
>   999cc urine on bladder scan; paged Ria Comment NP and order noted

## 2020-09-09 NOTE — Progress Notes (Signed)
Site area: right groin  Site Prior to Removal:  Level 0  Pressure Applied For 18 MINUTES    Minutes Beginning at 1050  Manual:   Yes.    Patient Status During Pull:  Stable  Post Pull Groin Site:  Level 0  Post Pull Instructions Given:  Yes.    Post Pull Pulses Present:  Yes.    Dressing Applied:  Yes.    Comments:   Bed rest started at 1110 X 4 hr.

## 2020-09-09 NOTE — Interval H&P Note (Signed)
History and Physical Interval Note:  09/09/2020 9:28 AM  Daniel Myers  has presented today for surgery, with the diagnosis of congestive heart failure.  The various methods of treatment have been discussed with the patient and family. After consideration of risks, benefits and other options for treatment, the patient has consented to  Procedure(s): RIGHT/LEFT HEART CATH AND CORONARY ANGIOGRAPHY (N/A) as a surgical intervention.  The patient's history has been reviewed, patient examined, no change in status, stable for surgery.  I have reviewed the patient's chart and labs.  Questions were answered to the patient's satisfaction.     Koryn Charlot [employer]

## 2020-09-11 ENCOUNTER — Telehealth: Payer: Medicare Other

## 2020-09-11 NOTE — Progress Notes (Signed)
Cardiology Office Note  Date: 09/12/2020   ID: Daniel Myers, Daniel Myers Jul 02, 1945, MRN [id number]  PCP:  Claretta Fraise, MD  Cardiologist:  Rozann Lesches, MD Electrophysiologist:  None   Chief Complaint:  3 month follow up  History of Present Illness: Daniel Myers is a 75 y.o. male with a history of NSTEMI, Atrial fibrillation, AKI on CKD, HTN, DM2, HLD, Covid pneumonia, acute respiratory failure with hypoxia, hyponatremia, renal transplant recipient (right), thrombocytopenia, systolic and diastolic HF, CVA, CAD, OSA.  Presented 02/11/2020 AP with SOB, CP, respiratory failure secondary to Covid pneumonia. Positive for NSTEMI and transferred to [facility].  Troponins were; [phone].  He developed volume overload and hyponatremia. Responded well to diuresis and hyponatremia was eventually corrected. He was continuing BB and ASA. Not on ACEI or ARBs d/t renal disease. Ischemic workup being contemplated by cardiology as an outpatient. Hypoxia resolved with tx of Covid and CHF. Echo showed mild LV dysfunction and segmental wall motion abnormalities. Daughter was to arrange follow up with nephrology. He was started on Apixaban and BB was increased for HR control. CHA2DS2-Vasc = 5. Cardiology recommended functional testing after recovery from Covid.  Platelets were 100 on recent blood draw at PCP office on 02/29/2020.   He had a recent abnormal stress test which was forwarded to Dr. Domenic Polite for review.  His recommendation was to have follow-up limited echocardiogram to reassess LVEF and ensure no actual decline. Dr Domenic Polite stated he would be high risk for contrast nephropathy were we to pursue a cardiac catheterization at that time, and medical therapy made the most sense as long as he was clinically stable.  If LVEF had actually declined, this may need to be reconsidered.    He had recent follow up echocardiogram 06/05/2020 : EF of 25 to 30% by estimation.  LV global hypokinesis.  LV internal  cavity size is moderately dilated.  Mild MR, mild TR. he is here today with his daughters.  He states he feels reasonably well.  States he has lost some weight since starting Invokana.  He is compliant with all of his medications.  He denies any anginal or exertional symptoms, shortness of breath, DOE, palpitations or arrhythmias, orthostatic symptoms, CVA or TIA-like symptoms, PND, orthopnea, bleeding issues.  Denies any claudication-like symptoms, DVT or PE-like symptoms, lower extremity edema.  Blood pressure is 118/72 with heart rate of 90.  We discussed the results of the stress test and possible next steps.  I spoke with he and his daughters regarding possible next steps due to decrease in EF on echocardiogram.   He was ultimately seen By Dr Aundra Dubin on 08/28/2020 who increased his Lasix to 40 mg po am and 20 mg po pm. Metoprolol was stopped and Toprol Xl 75 mg daily was started. Spironolactone was continued. Canagliflozin was continued. Plans were to start [facility] but plan was to wait until after cardiac catheterization. Dr Aundra Dubin mentioned patient would benefit from NSR given CHF. Plan was to arrange TEE guided DCCV after catheterization and he was back on Eliquis. Plan was to start Amiodarone with a slow load at 200 mg po daily given renal transplant medications which would give him the best chance of maintaining NSR. Zetia 10 mg daily was added with follow up lipids in 2 months. Dr Aundra Dubin noted if lipids were still elevated would refer to lipid clinic for [place].  Cardiac catheterization 09/09/2020 showed severe 3 vessel disease. Report noted he would need cardiac surgery but this would be high  risk given low EF. He recommended MAZE with CABG for atrial fibrillation.  He is here today for 35-monthfollow-up.  Recent cardiac catheterization showed significant three-vessel disease and plan is for bypass surgery and maze procedure he has an upcoming appointment with Dr. BCyndia BentCVTS August 10 regarding  possible bypass surgery.  He also has a follow-up with Dr. MAundra Dubinon August August 16.  He states he is feeling reasonably well given the circumstances.  He denies any anginal or exertional symptoms.  Blood pressure is well controlled today.  His most recent creatinine was 1.93 with GFR of 36.  He is a renal transplant recipient.  Dr. MAundra Dubinhad mentioned possibly starting patient on Entresto after cardiac catheterization.  His current cardiac regimen includes; amiodarone 200 mg daily, Eliquis 5 mg p.o. twice daily, atorvastatin 10 mg daily, Zetia 10 mg daily, Lasix 40 mg a.m. and 20 mg p.m., Toprol-XL 75 mg daily.  Spironolactone 12.5 mg p.o. daily.    Past Medical History:  Diagnosis Date   [place] 02/12/[place]   AKI (acute kidney injury) ([facility] 02/13/2020   Atrial fibrillation with RVR ([place] 03/11/2020   Basal cell carcinoma 06/27/2013   nodular on left jawline - excision   Basal cell carcinoma 07/01/2014   nodular on left hawling - CX3+5FU+excision   Basal cell carcinoma 10/10/2014   nodular on right forearm, middle - tx p bx   Basal cell carcinoma 02/11/2015   left neck - CX3 + excision   Basal cell carcinoma 06/14/2016   left jawline - CX3+5FU   Basal cell carcinoma 02/22/2017   superficial and nodular on left neck - excision   Basal cell carcinoma 04/11/2018   superficial and nodular on right inferior forearm - CX3+Cautery+5FU   Chronic kidney disease    History of renal transplant    Hyperlipidemia    Hypertension    NSTEMI (non-ST elevated myocardial infarction) ([place] 02/13/2020   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   in situ on left arm - CX3+5FU   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   hypertrophic on left ear - CX3+5FU   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   in situ on right temple - CX3+5FU   Squamous cell carcinoma of skin 11/08/2011   right upper forearm - tx p bx   Squamous cell carcinoma of skin 11/08/2011   left  upper forearm - tx p bx   Squamous cell carcinoma of skin 11/08/2011   left hand - tx p bx   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/27/2013   in situ on left lower back - CX3+5FU   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/27/2013   in situ on left forehead - CX3+5FU   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/27/2013   in situ on right temple - watch per ST   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/27/2013   in situ on right forearm - CX3+5FU   Squamous cell carcinoma of skin 07/01/2014   well differentiated on right sideburn - CX3+5FU   Squamous cell carcinoma of skin 07/01/2014   in situ on left shoulder - CX3+5FU   Squamous cell carcinoma of skin 07/01/2014   in situ on right forearm, proximal - CX3+5FU+Cautery   Squamous cell carcinoma of skin 07/01/2014   in situ on right forearm, distal - CX3+5FU   Squamous cell carcinoma of skin 09/30/2015   in situ on posterior left ear - CX3+5FU   Squamous cell carcinoma of skin  02/22/2017   in situ on right upper arm - CX3+5FU   Squamous cell carcinoma of skin 02/22/2017   in situ on left upper arm - CX3+5FU   Squamous cell carcinoma of skin 04/11/2018   in situ on right temple - CX3+5FU   Squamous cell carcinoma of skin 04/11/2018   in situ on lateral right arm - MOHs   Squamous cell carcinoma of skin 04/11/2018   in situ on right upper arm - MOHs   Squamous cell carcinoma of skin 04/11/2018   in situ on left sideburn   Squamous cell carcinoma of skin 04/11/2018   in situ on right flank - tx p bx   Squamous cell carcinoma of skin 09/28/2018   in situ on right inner ear - tx p bx   Squamous cell carcinoma of skin 09/28/2018   in situ on left inner ear - tx p bx   Squamous cell carcinoma of skin 09/28/2018   in situ on right arm - tx p bx   Squamous cell carcinoma of skin 03/21/2019   in situ on right antihelix (Mitkov treated topically)   Squamous cell carcinoma of skin 03/21/2019   in situ on right neck - CX3+5FU   Squamous cell carcinoma of skin  03/21/2019   in situ on left outer eye, inf (MOHs done 05/23/2019)   Type 2 diabetes mellitus ([place])    Unspecified atrial fibrillation ([place]) 02/17/2020    Past Surgical History:  Procedure Laterality Date   KIDNEY TRANSPLANT Right    RIGHT/LEFT HEART CATH AND CORONARY ANGIOGRAPHY N/A 09/09/2020   Procedure: RIGHT/LEFT HEART CATH AND CORONARY ANGIOGRAPHY;  Surgeon: Larey Dresser, MD;  Location: [place] [facility];  Service: Cardiovascular;  Laterality: N/A;    Current Outpatient Medications  Medication Sig Dispense Refill   acetaminophen (TYLENOL) 500 MG tablet Take 500 mg by mouth every 8 (eight) hours as needed for moderate pain.     albuterol (VENTOLIN HFA) 108 (90 Base) MCG/ACT inhaler Inhale 2 puffs into the lungs every 6 (six) hours as needed for wheezing or shortness of breath. 1 each 3   ALPRAZolam (XANAX) 0.25 MG tablet Take 0.5 mg by mouth at bedtime as needed for anxiety.     amiodarone (PACERONE) 200 MG tablet Take 1 tablet (200 mg total) by mouth daily. 30 tablet 11   apixaban (ELIQUIS) 5 MG TABS tablet Take 1 tablet (5 mg total) by mouth 2 (two) times daily. 60 tablet 1   ascorbic acid (VITAMIN C) 500 MG tablet Take 1 tablet (500 mg total) by mouth daily.     atorvastatin (LIPITOR) 10 MG tablet Take 1 tablet (10 mg total) by mouth daily. 90 tablet 1   blood glucose meter kit and supplies Dispense based on patient and insurance preference. Use up to four times daily as directed. (FOR ICD-10 E10.9, E11.9). 1 each 0   ezetimibe (ZETIA) 10 MG tablet Take 1 tablet (10 mg total) by mouth daily. 30 tablet 11   fluticasone (FLONASE) 50 MCG/ACT nasal spray USE ONE SPRAY(S) IN EACH NOSTRIL ONCE DAILY 16 g 11   furosemide (LASIX) 40 MG tablet Take 1 tablet (40 mg total) by mouth every morning AND 0.5 tablets (20 mg total) every evening. 45 tablet 11   insulin NPH Human (NOVOLIN N) 100 UNIT/ML injection 10 units AC breakfast and 10 units AC supper 30 mL 11   Insulin Regular Human 100  UNIT/ML SOPN Inject 16 units before breakfast and 10 before  supper 10 mL prn   INVOKANA 300 MG TABS tablet TAKE 1 TABLET BY MOUTH ONCE DAILY BEFORE BREAKFAST 30 tablet 1   loratadine (CLARITIN) 10 MG tablet Take 10 mg by mouth daily as needed for allergies.     metoprolol succinate (TOPROL XL) 50 MG 24 hr tablet Take 1.5 tablets (75 mg total) by mouth daily. Take with or immediately following a meal. 45 tablet 11   mycophenolate (CELLCEPT) 250 MG capsule Take 500 mg by mouth 2 (two) times daily.     ondansetron (ZOFRAN) 4 MG tablet Take 1 tablet (4 mg total) by mouth every 8 (eight) hours as needed for nausea or vomiting. 20 tablet 0   predniSONE (DELTASONE) 5 MG tablet Take 5 mg by mouth daily with breakfast.     RELION INSULIN SYRINGE 31G X 15/64" 0.5 ML MISC USE AS DIRECTED FOR INSULIN     sodium bicarbonate 650 MG tablet Take 650 mg by mouth 2 (two) times daily.     spironolactone (ALDACTONE) 25 MG tablet Take 0.5 tablets (12.5 mg total) by mouth daily.     tacrolimus (PROGRAF) 0.5 MG capsule Take 1 mg by mouth in the morning and at bedtime.     zinc sulfate 220 (50 Zn) MG capsule Take 1 capsule (220 mg total) by mouth daily.     No current facility-administered medications for this visit.   Allergies:  Tape, Trazodone and nefazodone, Mirtazapine, and Elemental sulfur   Social History: The patient  reports that he has been smoking pipe. He has never used smokeless tobacco. He reports that he does not drink alcohol and does not use drugs.   Family History: The patient's family history includes Clotting disorder in his mother; Diabetes in his sister; Hypertension in his sister.   ROS:  Please see the history of present illness. Otherwise, complete review of systems is positive for none.  All other systems are reviewed and negative.   Physical Exam: VS:  BP 130/70   Pulse 76   Ht '5\' 10"'  (1.778 m)   Wt 190 lb 12.8 oz (86.5 kg)   SpO2 95%   BMI 27.38 kg/m , BMI Body mass index is 27.38  kg/m.  Wt Readings from Last 3 Encounters:  09/12/20 190 lb 12.8 oz (86.5 kg)  09/09/20 190 lb (86.2 kg)  08/28/20 190 lb 9.6 oz (86.5 kg)    General: Patient appears comfortable at rest. Neck: Supple, no elevated JVP or carotid bruits, no thyromegaly. Lungs: Clear to auscultation, nonlabored breathing at rest. Cardiac: Irregularly irregular rate and rhythm, no S3 or significant systolic murmur, no pericardial rub. Extremities: Mild pitting edema, distal pulses 2+. Skin: Warm and dry. Musculoskeletal: No kyphosis. Neuropsychiatric: Alert and oriented x3, affect grossly appropriate.  ECG: May 05, 2020 EKG atrial fibrillation with premature ventricular or aberrantly conducted complexes rate of 100.  Inferior infarct, age undetermined.  Anterolateral infarct, age undetermined.  Recent Labwork: 02/17/2020: Magnesium 2.4 05/07/2020: NT-Pro BNP 40,513 08/05/2020: ALT 11; AST 19 08/28/2020: B Natriuretic Peptide 1,114.6; Platelets 132 09/09/2020: BUN 51; Creatinine, Ser 1.93; Hemoglobin 13.9; Hemoglobin 14.3; Potassium 4.6; Potassium 4.7; Sodium 140; Sodium 140     Component Value Date/Time   CHOL 149 08/05/2020 1414   TRIG 90 08/05/2020 1414   HDL 40 08/05/2020 1414   CHOLHDL 3.7 08/05/2020 1414   LDLCALC 92 08/05/2020 1414    Other Studies Reviewed Today:   Echocardiogram 06/05/2020  1. Left ventricular ejection fraction, by estimation, is 25 to 30%.  The left ventricle has severely decreased function. The left ventricle demonstrates global hypokinesis. The left ventricular internal cavity size was moderately dilated. Left ventricular diastolic parameters are indeterminate. 2. Right ventricular systolic function is mildly reduced. The right ventricular size is mildly enlarged. 3. Mild mitral valve regurgitation. No evidence of mitral stenosis. 4. The tricuspid valve is abnormal. Tricuspid valve regurgitation is mild. 5. The aortic valve has an indeterminant number of cusps. Aortic  valve regurgitation is not visualized. No aortic stenosis is present. 6. Limited echo Comparison(s): Echocardiogram done 02/12/20 showed an EF of 40-45%.      Cardiac catheterization 09/09/2020 Conclusion      Prox RCA to Mid RCA lesion is 100% stenosed.   1st Mrg lesion is 80% stenosed.   2nd Mrg-1 lesion is 70% stenosed.   2nd Mrg-2 lesion is 80% stenosed.   1st Diag lesion is 80% stenosed.   Dist LM lesion is 30% stenosed.   Ost Cx lesion is 80% stenosed.   2nd Diag lesion is 80% stenosed.   Prox LAD lesion is 75% stenosed.   Mid LAD lesion is 99% stenosed.   1. Normal (low) filling pressures. 2. Preserved cardiac output. 3. Severe 3 vessel disease.  The RCA is occluded with collaterals to the PDA.  The proximal-mid LAD has a long segment of up to 99% stenosis.  The ostial LCx has 80% stenosis.  There is significant disease in the OMs and diagonals.   The patient feels good, he has had no chest pain and on current HF medication regimen, he has minimal dyspnea.  I am going to hydrate him post-cath given CKD 3 and renal transplant.  I will let him go home, may start Eliquis tomorrow morning.  He will need cardiac surgery consult asap (within the next week, will arrange).  He is not a good candidate for PCI, he is a CABG candidate but this will be high risk with low EF (though cardiac output preserved) and CKD.  Would recommend Maze also with CABG.     Diagnostic Dominance: Right         Stress Myoview 05/02/2020   Narrative & Impression  Lexiscan stress is electrically negative for ischemia Myovue scan shows large fixed defect in the inferior and inferolateral walls (base, mid, distal); distal anterior wall and apex consistent with scar and some possible soft tissue attenuation; no ischemia. LVEF calculated at 21% with diffuse hypokinesis Overall high risk scan      Echocardiogram 02/12/2020 1. Left ventricular ejection fraction, by estimation, is 40 to 45%. The left  ventricle has mildly decreased function. The left ventricle demonstrates regional wall motion abnormalities (see scoring diagram/findings for description). The left ventricular internal cavity size was mildly dilated. There is mild left ventricular hypertrophy. Left ventricular diastolic parameters are suggestive of Grade III diastolic dysfunction (restrictive). 2. Right ventricular systolic function is normal. The right ventricular size is normal. Tricuspid regurgitation signal is inadequate for assessing PA pressure. 3. Left atrial size was severely dilated. 4. The mitral valve is grossly normal, mildly calcified. Trivial mitral valve regurgitation. 5. The aortic valve is tricuspid. Aortic valve regurgitation is not visualized. 6. The inferior vena cava is dilated in size with >50% respiratory variability, suggesting right atrial pressure of 8 mmHg.  Assessment and Plan:  1. CAD in native artery   2. Atrial fibrillation, unspecified type ([place])   3. Chronic combined systolic and diastolic congestive heart failure (HCC)   4. Stage 3 chronic kidney disease, unspecified whether  stage 3a or 3b CKD ([place])   5. Renal transplant recipient   6. Mixed hyperlipidemia     1. CAD S/P NSTEMI Recent cardiac catheterization showed multivessel CAD.  See report above he has been referred to Dr. Cyndia Bent for consult for possible CABG and Maze procedure.  He currently denies any anginal or exertional symptoms.   2. Atrial fibrillation, unspecified type ([place]) He remains in atrial fibrillation today with current rate of 76.  Continue Toprol-XL 75 mg daily.  Continue amiodarone 200 mg daily.  Continue Eliquis 5 mg p.o. twice daily.  3. Chronic combined systolic (congestive) and diastolic (congestive) heart failure (HCC) Recent follow-up echocardiogram.06/05/2020 : EF of 25 to 30% by estimation.  LV global hypokinesis.  LV internal cavity size is moderately dilated.  Mild MR, mild TR.supplementation daily.   Current heart failure regimen includes; Toprol-XL 75 mg daily,  Spironolactone 12.5 mg daily, Lasix 40 mg a.m. and 20 mg p.m. Dr. Aundra Dubin had mentioned starting [facility] after cardiac catheterization.  Patient's most recent creatinine was 1.93 with GFR 36.  It appears Entresto has not been started.  Will forward chart to both Dr. Domenic Polite and Dr. Aundra Dubin for instructions on whether to initiate Entresto given decreased renal function and history of renal transplant recipient.   4. Stage 3 chronic kidney disease, unspecified whether stage 3a or 3b CKD (HCC) Recent reactive anemia 1.93 with GFR of 36...  Continue Lasix 40 mg a.m. and 20 mg p.m.  5. Renal transplant recipient Previous renal transplant recipient on tacrolimus.  Most recent renal function creatinine 1.93 with GFR of 36.  6. Hyperlipidemia Continue atorvastatin 10 mg daily.  Continue Zetia 10 mg daily.   Medication Adjustments/Labs and Tests Ordered: Current medicines are reviewed at length with the patient today.  Concerns regarding medicines are outlined above.   Disposition: Follow-up with Dr. Domenic Polite or APP 3 months Signed, Levell July, NP 09/12/2020 10:54 AM    [facility] [place], [place], [place] [address] Phone: [phone]; Fax: [phone]

## 2020-09-12 ENCOUNTER — Other Ambulatory Visit: Payer: Self-pay

## 2020-09-12 ENCOUNTER — Encounter: Payer: Self-pay | Admitting: Family Medicine

## 2020-09-12 ENCOUNTER — Ambulatory Visit (INDEPENDENT_AMBULATORY_CARE_PROVIDER_SITE_OTHER): Payer: Medicare Other | Admitting: Family Medicine

## 2020-09-12 VITALS — BP 130/70 | HR 76 | Ht 70.0 in | Wt 190.8 lb

## 2020-09-12 DIAGNOSIS — E782 Mixed hyperlipidemia: Secondary | ICD-10-CM

## 2020-09-12 DIAGNOSIS — I5042 Chronic combined systolic (congestive) and diastolic (congestive) heart failure: Secondary | ICD-10-CM | POA: Diagnosis not present

## 2020-09-12 DIAGNOSIS — N183 Chronic kidney disease, stage 3 unspecified: Secondary | ICD-10-CM | POA: Diagnosis not present

## 2020-09-12 DIAGNOSIS — I2510 Atherosclerotic heart disease of native coronary artery without angina pectoris: Secondary | ICD-10-CM | POA: Diagnosis not present

## 2020-09-12 DIAGNOSIS — Z940 Kidney transplant status: Secondary | ICD-10-CM

## 2020-09-12 DIAGNOSIS — I4891 Unspecified atrial fibrillation: Secondary | ICD-10-CM | POA: Diagnosis not present

## 2020-09-12 NOTE — Patient Instructions (Addendum)

## 2020-09-16 ENCOUNTER — Telehealth: Payer: Medicare Other | Admitting: *Deleted

## 2020-09-16 ENCOUNTER — Telehealth: Payer: Self-pay | Admitting: *Deleted

## 2020-09-17 ENCOUNTER — Other Ambulatory Visit: Payer: Self-pay

## 2020-09-17 ENCOUNTER — Encounter: Payer: Self-pay | Admitting: Family Medicine

## 2020-09-17 ENCOUNTER — Ambulatory Visit (INDEPENDENT_AMBULATORY_CARE_PROVIDER_SITE_OTHER): Payer: Medicare Other | Admitting: Family Medicine

## 2020-09-17 VITALS — BP 128/78 | HR 82 | Temp 97.1°F | Ht 70.0 in | Wt 189.8 lb

## 2020-09-17 DIAGNOSIS — Z940 Kidney transplant status: Secondary | ICD-10-CM | POA: Diagnosis not present

## 2020-09-17 DIAGNOSIS — E782 Mixed hyperlipidemia: Secondary | ICD-10-CM

## 2020-09-17 DIAGNOSIS — I4819 Other persistent atrial fibrillation: Secondary | ICD-10-CM | POA: Diagnosis not present

## 2020-09-17 DIAGNOSIS — I2510 Atherosclerotic heart disease of native coronary artery without angina pectoris: Secondary | ICD-10-CM | POA: Diagnosis not present

## 2020-09-17 NOTE — Progress Notes (Signed)
Subjective:  Patient ID: Daniel Myers, male    DOB: [birth year]  Age: 75 y.o. MRN: [id number]  CC: Diabetes   HPI Daniel Myers presents forFollow-up of diabetes. Patient checks blood sugar at home.   100, or a little more  fasting and 180-200 postprandial Patient denies symptoms such as polyuria, polydipsia, excessive hunger, nausea No significant hypoglycemic spells noted. Has decreased renal function, but has transplanted kidney. Medications reviewed. Pt reports taking them regularly without complication/adverse reaction being reported today.  Atrial fibrillation follow up. Pt. is treated with rate control and anticoagulation. Pt.  denies palpitations, rapid rate, chest pain, dyspnea and edema. There has been no bleeding from nose or gums. Pt. has not noticed blood with urine or stool.  Although there is routine bruising easily, it is not excessive.  Marland Kitchen History Daniel Myers has a past medical history of Acute respiratory failure with hypoxia ([place]) (02/12/2020), AKI (acute kidney injury) ([place]) (02/13/2020), Atrial fibrillation with RVR ([place]) (03/11/2020), Basal cell carcinoma (06/27/2013), Basal cell carcinoma (07/01/2014), Basal cell carcinoma (10/10/2014), Basal cell carcinoma (02/11/2015), Basal cell carcinoma (06/14/2016), Basal cell carcinoma (02/22/2017), Basal cell carcinoma (04/11/2018), Chronic kidney disease, History of renal transplant, Hyperlipidemia, Hypertension, NSTEMI (non-ST elevated myocardial infarction) ([place]) (02/13/2020), 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), 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), 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), Squamous cell carcinoma of skin (11/08/2011), Squamous cell carcinoma of skin (11/08/2011), Squamous cell carcinoma of skin (11/08/2011), 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), 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), 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), 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), Squamous cell  carcinoma of skin (07/01/2014), 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), 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), 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), Squamous cell carcinoma of skin (09/30/2015), Squamous cell carcinoma of skin (02/22/2017), Squamous cell carcinoma of skin (02/22/2017), Squamous cell carcinoma of skin (04/11/2018), Squamous cell carcinoma of skin (04/11/2018), Squamous cell carcinoma of skin (04/11/2018), Squamous cell carcinoma of skin (04/11/2018), Squamous cell carcinoma of skin (04/11/2018), Squamous cell carcinoma of skin (09/28/2018), Squamous cell carcinoma of skin (09/28/2018), Squamous cell carcinoma of skin (09/28/2018), Squamous cell carcinoma of skin (03/21/2019), Squamous cell carcinoma of skin (03/21/2019), Squamous cell carcinoma of skin (03/21/2019), Type 2 diabetes mellitus ([place]), and Unspecified atrial fibrillation ([place]) (02/17/2020).   Daniel Myers has a past surgical history that includes Kidney transplant (Right) and RIGHT/LEFT HEART CATH AND CORONARY ANGIOGRAPHY (N/A, 09/09/2020).   His family history includes Clotting disorder in his mother; Diabetes in his sister; Hypertension in his sister.Daniel Myers reports that Daniel Myers has been smoking pipe. Daniel Myers has never used smokeless tobacco. Daniel Myers reports that Daniel Myers does not drink alcohol and does not use drugs.  Current Outpatient Medications on File Prior to Visit  Medication Sig Dispense Refill   acetaminophen (TYLENOL) 500 MG tablet Take 500 mg by mouth every 8 (eight) hours as needed for moderate pain.     albuterol (VENTOLIN HFA) 108 (90 Base) MCG/ACT inhaler Inhale 2 puffs into the lungs every 6 (six) hours as needed for wheezing or shortness of breath. 1 each 3   ALPRAZolam (XANAX) 0.25 MG tablet Take 0.5 mg by mouth at bedtime as needed for anxiety.     amiodarone (PACERONE) 200 MG tablet Take 1 tablet (200 mg total) by mouth daily. 30 tablet 11   apixaban (ELIQUIS) 5 MG TABS tablet Take 1 tablet (5 mg total) by mouth 2 (two)  times daily. 60 tablet 1   ascorbic acid (VITAMIN C) 500 MG tablet  Take 1 tablet (500 mg total) by mouth daily.     atorvastatin (LIPITOR) 10 MG tablet Take 1 tablet (10 mg total) by mouth daily. 90 tablet 1   blood glucose meter kit and supplies Dispense based on patient and insurance preference. Use up to four times daily as directed. (FOR ICD-10 E10.9, E11.9). 1 each 0   ezetimibe (ZETIA) 10 MG tablet Take 1 tablet (10 mg total) by mouth daily. 30 tablet 11   fluticasone (FLONASE) 50 MCG/ACT nasal spray USE ONE SPRAY(S) IN EACH NOSTRIL ONCE DAILY 16 g 11   furosemide (LASIX) 40 MG tablet Take 1 tablet (40 mg total) by mouth every morning AND 0.5 tablets (20 mg total) every evening. 45 tablet 11   insulin NPH Human (NOVOLIN N) 100 UNIT/ML injection 10 units AC breakfast and 10 units AC supper 30 mL 11   Insulin Regular Human 100 UNIT/ML SOPN Inject 16 units before breakfast and 10 before supper 10 mL prn   INVOKANA 300 MG TABS tablet TAKE 1 TABLET BY MOUTH ONCE DAILY BEFORE BREAKFAST 30 tablet 1   loratadine (CLARITIN) 10 MG tablet Take 10 mg by mouth daily as needed for allergies.     metoprolol succinate (TOPROL XL) 50 MG 24 hr tablet Take 1.5 tablets (75 mg total) by mouth daily. Take with or immediately following a meal. 45 tablet 11   mycophenolate (CELLCEPT) 250 MG capsule Take 500 mg by mouth 2 (two) times daily.     ondansetron (ZOFRAN) 4 MG tablet Take 1 tablet (4 mg total) by mouth every 8 (eight) hours as needed for nausea or vomiting. 20 tablet 0   predniSONE (DELTASONE) 5 MG tablet Take 5 mg by mouth daily with breakfast.     RELION INSULIN SYRINGE 31G X 15/64" 0.5 ML MISC USE AS DIRECTED FOR INSULIN     sodium bicarbonate 650 MG tablet Take 650 mg by mouth 2 (two) times daily.     spironolactone (ALDACTONE) 25 MG tablet Take 0.5 tablets (12.5 mg total) by mouth daily.     tacrolimus (PROGRAF) 0.5 MG capsule Take 1 mg by mouth in the morning and at bedtime.     zinc sulfate 220 (50  Zn) MG capsule Take 1 capsule (220 mg total) by mouth daily.     No current facility-administered medications on file prior to visit.    ROS Review of Systems  Constitutional:  Negative for fever.  HENT:  Positive for nosebleeds (occasional X 2 weeks).   Respiratory:  Negative for shortness of breath.   Cardiovascular:  Negative for chest pain.  Musculoskeletal:  Negative for arthralgias.  Skin:  Negative for rash.   Objective:  BP 128/78   Pulse 82   Temp (!) 97.1 F (36.2 C)   Ht 5' 10" (1.778 m)   Wt 189 lb 12.8 oz (86.1 kg)   SpO2 97%   BMI 27.23 kg/m   BP Readings from Last 3 Encounters:  09/17/20 128/78  09/12/20 130/70  09/09/20 112/73    Wt Readings from Last 3 Encounters:  09/17/20 189 lb 12.8 oz (86.1 kg)  09/12/20 190 lb 12.8 oz (86.5 kg)  09/09/20 190 lb (86.2 kg)     Physical Exam Vitals reviewed.  Constitutional:      Appearance: Daniel Myers is well-developed.  HENT:     Head: Normocephalic and atraumatic.     Right Ear: External ear normal.     Left Ear: External ear normal.     Mouth/Throat:  Pharynx: No oropharyngeal exudate or posterior oropharyngeal erythema.  Eyes:     Pupils: Pupils are equal, round, and reactive to light.  Cardiovascular:     Rate and Rhythm: Normal rate and regular rhythm.     Heart sounds: No murmur heard. Pulmonary:     Effort: No respiratory distress.     Breath sounds: Normal breath sounds.  Musculoskeletal:     Cervical back: Normal range of motion and neck supple.  Neurological:     Mental Status: Daniel Myers is alert and oriented to person, place, and time.      Assessment & Plan:   Daniel Myers was seen today for diabetes.  Diagnoses and all orders for this visit:  Mixed hyperlipidemia     I am having Daniel Myers maintain his tacrolimus, sodium bicarbonate, mycophenolate, ReliOn Insulin Syringe, loratadine, blood glucose meter kit and supplies, zinc sulfate, ascorbic acid, albuterol, predniSONE, ondansetron,  ALPRAZolam, atorvastatin, fluticasone, spironolactone, insulin NPH Human, Insulin Regular Human, ezetimibe, amiodarone, metoprolol succinate, furosemide, acetaminophen, Invokana, and apixaban.  No orders of the defined types were placed in this encounter.    Follow-up: No follow-ups on file.  Claretta Fraise, M.D.

## 2020-09-17 NOTE — Telephone Encounter (Signed)
  Care Management   Follow Up Note   09/16/2020 Name: Daniel Myers MRN: [id number] DOB: 1945/10/03   Referred by: Claretta Fraise, MD Reason for referral : Chronic Care Management (Unsuccessful outreach)   An unsuccessful telephone outreach was attempted today. The patient was referred to the case management team for assistance with care management and care coordination.   Follow Up Plan: A HIPPA compliant phone message was left for the patient providing contact information and requesting a return call. Forwarding to [facility] for outreach and rescheduling.   [name], BSN, RN-BC Embedded Chronic Care Manager [facility] Direct Dial: [phone]

## 2020-09-22 ENCOUNTER — Encounter: Payer: Medicare Other | Admitting: Surgery

## 2020-09-23 ENCOUNTER — Encounter (HOSPITAL_COMMUNITY): Payer: Medicare Other | Admitting: Cardiology

## 2020-10-01 ENCOUNTER — Other Ambulatory Visit: Payer: Self-pay | Admitting: Surgery

## 2020-10-01 ENCOUNTER — Other Ambulatory Visit: Payer: Self-pay | Admitting: *Deleted

## 2020-10-01 ENCOUNTER — Telehealth: Payer: Medicare Other | Admitting: *Deleted

## 2020-10-01 ENCOUNTER — Institutional Professional Consult (permissible substitution) (INDEPENDENT_AMBULATORY_CARE_PROVIDER_SITE_OTHER): Payer: Medicare Other | Admitting: Surgery

## 2020-10-01 ENCOUNTER — Other Ambulatory Visit: Payer: Self-pay

## 2020-10-01 ENCOUNTER — Encounter: Payer: Self-pay | Admitting: Surgery

## 2020-10-01 VITALS — BP 115/62 | HR 80 | Resp 20 | Ht 70.0 in | Wt 190.0 lb

## 2020-10-01 DIAGNOSIS — I2510 Atherosclerotic heart disease of native coronary artery without angina pectoris: Secondary | ICD-10-CM

## 2020-10-01 NOTE — Progress Notes (Signed)
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  His ejection fraction by echocardiogram at that time was 40 to 45%.  He ruled in for non-ST segment elevation MI with a high-sensitivity troponin of 372 increasing to a peak of 8647.  He also developed atrial fibrillation.  Cardiac catheterization was not done at that time due to elevated creatinine.  He had a follow-up Cardiolite scan on 05/02/2020 which showed no ischemia.  There was a large fixed defect in the inferior and inferolateral walls as well as the distal anterior wall and apex consistent with scar.  Ejection fraction was 21% with diffuse hypokinesis.  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  This resulted in persistent weakness on the left.  He attended physical therapy and improved but still has some weakness and uses a cane to walk.  A follow-up echocardiogram on 06/05/2020 showed a drop in his left ventricular ejection fraction to 25 to 30% with global hypokinesis.  The LVEDD was 6.48 and the LV ESD was 5.75 cm.  The right ventricle was mildly enlarged with mildly reduced systolic function.  There is mild MR and TR.  He was seen by Dr. Aundra Dubin on 08/28/2020 and underwent cardiac catheterization on 09/09/2020 showing severe three-vessel coronary disease.  The proximal  LAD had 75% stenosis followed by a 99% mid LAD stenosis with slow flow filling the distal vessel.  The first diagonal had 80% stenosis in the second diagonal 80% stenosis.  The left circumflex had ostial 80% stenosis with a large marginal branch that had 80% ostial stenosis.  The proximal RCA was occluded with filling of the distal vessel by collaterals from the left.  Filling pressures were normal with preserved cardiac index of 2.21.  He is here today with his daughter.  He denies any chest pain or pressure.  He denies any shortness of breath with activities around the house but does have some shortness of breath with walking longer distances.  He denies any orthopnea or PND.  He denies palpitations and does not feel his atrial fibrillation.  He denies any dizziness or syncope.  He has some lower extremity edema in his ankles.  Past Medical History:  Diagnosis Date   Acute respiratory failure with hypoxia ([place]) 02/12/2020   AKI (acute kidney injury) ([place]) 02/13/2020   Atrial fibrillation with RVR ([place]) 03/11/2020   Basal cell carcinoma 06/27/2013   nodular on left jawline - excision   Basal cell carcinoma 07/01/2014   nodular on left hawling - CX3+5FU+excision   Basal cell carcinoma 10/10/2014   nodular on right forearm, middle - tx p bx   Basal cell carcinoma 02/11/2015   left neck - CX3 + excision   Basal cell carcinoma 06/14/2016   left jawline - CX3+5FU   Basal cell carcinoma 02/22/2017   superficial and nodular on left neck - excision  Basal cell carcinoma 04/11/2018   superficial and nodular on right inferior forearm - CX3+Cautery+5FU   Chronic kidney disease    History of renal transplant    Hyperlipidemia    Hypertension    NSTEMI (non-ST elevated myocardial infarction) ([place]) 02/13/2020   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   in situ on left arm - CX3+5FU   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   hypertrophic on left ear - CX3+5FU   Squamous cell carcinoma of skin  08/05/2010   in situ on right temple - CX3+5FU   Squamous cell carcinoma of skin 11/08/2011   right upper forearm - tx p bx   Squamous cell carcinoma of skin 11/08/2011   left upper forearm - tx p bx   Squamous cell carcinoma of skin 11/08/2011   left hand - tx p bx   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/27/2013   in situ on left lower back - CX3+5FU   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/27/2013   in situ on left forehead - CX3+5FU   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/27/2013   in situ on right temple - watch per ST   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/27/2013   in situ on right forearm - CX3+5FU   Squamous cell carcinoma of skin 07/01/2014   well differentiated on right sideburn - CX3+5FU   Squamous cell carcinoma of skin 07/01/2014   in situ on left shoulder - CX3+5FU   Squamous cell carcinoma of skin 07/01/2014   in situ on right forearm, proximal - CX3+5FU+Cautery   Squamous cell carcinoma of skin 07/01/2014   in situ on right forearm, distal - CX3+5FU   Squamous cell carcinoma of skin 09/30/2015   in situ on posterior left ear - CX3+5FU   Squamous cell carcinoma of skin 02/22/2017   in situ on right upper arm - CX3+5FU   Squamous cell carcinoma of skin 02/22/2017   in situ on left upper arm - CX3+5FU   Squamous cell carcinoma of skin 04/11/2018   in situ on right temple - CX3+5FU   Squamous cell carcinoma of skin 04/11/2018   in situ on lateral right arm - MOHs   Squamous cell carcinoma of skin 04/11/2018   in situ on right upper arm - MOHs   Squamous cell carcinoma of skin 04/11/2018   in situ on left sideburn   Squamous cell carcinoma of skin 04/11/2018   in situ on right flank - tx p bx   Squamous cell carcinoma of skin 09/28/2018   in situ on right inner ear - tx p bx   Squamous cell carcinoma of skin 09/28/2018   in situ on left inner ear - tx p bx   Squamous cell carcinoma of skin 09/28/2018   in situ on right arm - tx p bx   Squamous cell carcinoma of skin  03/21/2019   in situ on right antihelix (Mitkov treated topically)   Squamous cell carcinoma of skin 03/21/2019   in situ on right neck - CX3+5FU   Squamous cell carcinoma of skin 03/21/2019   in situ on left outer eye, inf (MOHs done 05/23/2019)   Type 2 diabetes mellitus ([place])    Unspecified atrial fibrillation ([place]) 02/17/2020    Past Surgical History:  Procedure Laterality Date   KIDNEY TRANSPLANT Right    RIGHT/LEFT HEART CATH AND CORONARY ANGIOGRAPHY N/A 09/09/2020   Procedure: RIGHT/LEFT HEART CATH AND CORONARY ANGIOGRAPHY;  Surgeon: Larey Dresser, MD;  Location: [facility];  Service: Cardiovascular;  Laterality: N/A;    Family History  Problem Relation Age of Onset   Clotting disorder Mother    Hypertension Sister    Diabetes Sister     Social History Social History   Tobacco Use   Smoking status: Some Days    Types: Pipe   Smokeless tobacco: Never  Vaping Use   Vaping Use: Never used  Substance Use Topics   Alcohol use: Never   Drug use: Never    Current Outpatient Medications  Medication Sig Dispense Refill   acetaminophen (TYLENOL) 500 MG tablet Take 500 mg by mouth every 8 (eight) hours as needed for moderate pain.     albuterol (VENTOLIN HFA) 108 (90 Base) MCG/ACT inhaler Inhale 2 puffs into the lungs every 6 (six) hours as needed for wheezing or shortness of breath. 1 each 3   ALPRAZolam (XANAX) 0.25 MG tablet Take 0.5 mg by mouth at bedtime as needed for anxiety.     amiodarone (PACERONE) 200 MG tablet Take 1 tablet (200 mg total) by mouth daily. 30 tablet 11   apixaban (ELIQUIS) 5 MG TABS tablet Take 1 tablet (5 mg total) by mouth 2 (two) times daily. 60 tablet 1   ascorbic acid (VITAMIN C) 500 MG tablet Take 1 tablet (500 mg total) by mouth daily.     atorvastatin (LIPITOR) 10 MG tablet Take 1 tablet (10 mg total) by mouth daily. 90 tablet 1   blood glucose meter kit and supplies Dispense based on patient and insurance preference. Use up to four  times daily as directed. (FOR ICD-10 E10.9, E11.9). 1 each 0   ezetimibe (ZETIA) 10 MG tablet Take 1 tablet (10 mg total) by mouth daily. 30 tablet 11   fluticasone (FLONASE) 50 MCG/ACT nasal spray USE ONE SPRAY(S) IN EACH NOSTRIL ONCE DAILY 16 g 11   furosemide (LASIX) 40 MG tablet Take 1 tablet (40 mg total) by mouth every morning AND 0.5 tablets (20 mg total) every evening. 45 tablet 11   insulin NPH Human (NOVOLIN N) 100 UNIT/ML injection 10 units AC breakfast and 10 units AC supper 30 mL 11   Insulin Regular Human 100 UNIT/ML SOPN Inject 16 units before breakfast and 10 before supper 10 mL prn   INVOKANA 300 MG TABS tablet TAKE 1 TABLET BY MOUTH ONCE DAILY BEFORE BREAKFAST 30 tablet 1   loratadine (CLARITIN) 10 MG tablet Take 10 mg by mouth daily as needed for allergies.     metoprolol succinate (TOPROL XL) 50 MG 24 hr tablet Take 1.5 tablets (75 mg total) by mouth daily. Take with or immediately following a meal. 45 tablet 11   mycophenolate (CELLCEPT) 250 MG capsule Take 500 mg by mouth 2 (two) times daily.     ondansetron (ZOFRAN) 4 MG tablet Take 1 tablet (4 mg total) by mouth every 8 (eight) hours as needed for nausea or vomiting. 20 tablet 0   predniSONE (DELTASONE) 5 MG tablet Take 5 mg by mouth daily with breakfast.     RELION INSULIN SYRINGE 31G X 15/64" 0.5 ML MISC USE AS DIRECTED FOR INSULIN     sodium bicarbonate 650 MG tablet Take 650 mg by mouth 2 (two) times daily.     spironolactone (ALDACTONE) 25 MG tablet Take 0.5 tablets (12.5 mg total) by mouth daily.     tacrolimus (PROGRAF) 0.5 MG capsule Take 1 mg by mouth in the morning and at bedtime.  zinc sulfate 220 (50 Zn) MG capsule Take 1 capsule (220 mg total) by mouth daily.     No current facility-administered medications for this visit.    Allergies  Allergen Reactions   Tape Rash    Use paper tape.    Trazodone And Nefazodone Palpitations    tachycardia   Mirtazapine     imbalance   Elemental Sulfur Rash     Review of Systems  Constitutional:  Negative for activity change, appetite change and fatigue.  HENT: Negative.         Has dentures  Eyes: Negative.   Respiratory:  Negative for chest tightness and shortness of breath.   Cardiovascular:  Positive for leg swelling. Negative for chest pain and palpitations.  Gastrointestinal: Negative.   Endocrine: Negative.   Genitourinary: Negative.   Musculoskeletal:  Positive for gait problem.  Skin: Negative.   Allergic/Immunologic: Negative.   Neurological:  Positive for weakness. Negative for dizziness and syncope.  Hematological: Negative.   Psychiatric/Behavioral: Negative.     BP 115/62   Pulse 80   Resp 20   Ht _0  (1.778 m)   Wt 190 lb (86.2 kg)   SpO2 99% Comment: RA  BMI 27.26 kg/m  Physical Exam Constitutional:      Appearance: Normal appearance. He is normal weight.  HENT:     Head: Normocephalic and atraumatic.  Eyes:     Extraocular Movements: Extraocular movements intact.     Conjunctiva/sclera: Conjunctivae normal.     Pupils: Pupils are equal, round, and reactive to light.  Neck:     Vascular: No carotid bruit.  Cardiovascular:     Rate and Rhythm: Normal rate. Rhythm irregular.     Pulses: Normal pulses.     Heart sounds: No murmur heard. Pulmonary:     Effort: Pulmonary effort is normal.     Breath sounds: Normal breath sounds.  Abdominal:     General: Abdomen is flat. There is no distension.     Palpations: Abdomen is soft.     Tenderness: There is no abdominal tenderness.  Musculoskeletal:     Cervical back: Normal range of motion and neck supple.     Comments: Mild ankle edema  Skin:    General: Skin is warm and dry.  Neurological:     Mental Status: He is alert.     Motor: Weakness present.     Comments: Left arm  Psychiatric:        Mood and Affect: Mood normal.        Behavior: Behavior normal.     Diagnostic Tests:  ECHOCARDIOGRAM LIMITED REPORT         Patient Name:   Daniel Myers Date of Exam: 06/05/2020  Medical Rec #:  [id number]     Height:       70.0 in  Accession #:    [id number]    Weight:       207.8 lb  Date of Birth:  1945-06-16     BSA:          2.122 m  Patient Age:    41 years      BP:           127/82 mmHg  Patient Gender: M             HR:           89 bpm.  Exam Location:  [facility]   Procedure: Cardiac Doppler, Color Doppler, Limited Echo  and Strain  Analysis   Indications:     I21.4 NSTEMI R93.1 Decreased cardiac ejection fraction     History:         Patient has prior history of Echocardiogram examinations,  most                   recent 02/12/2020. CHF, NSTEMI, Covid-19, Renal transplant                   recipient; Risk Factors:Hypertension, Diabetes,  Dyslipidemia                   and Obese.     Sonographer:     Jeneen [place] RDMS, RVT, RDCS  Referring Phys:  [place].  Diagnosing Phys: Carlyle Dolly MD   IMPRESSIONS     1. Left ventricular ejection fraction, by estimation, is 25 to 30%. The  left ventricle has severely decreased function. The left ventricle  demonstrates global hypokinesis. The left ventricular internal cavity size  was moderately dilated. Left  ventricular diastolic parameters are indeterminate.   2. Right ventricular systolic function is mildly reduced. The right  ventricular size is mildly enlarged.   3. Mild mitral valve regurgitation. No evidence of mitral stenosis.   4. The tricuspid valve is abnormal. Tricuspid valve regurgitation is  mild.   5. The aortic valve has an indeterminant number of cusps. Aortic valve  regurgitation is not visualized. No aortic stenosis is present.   6. Limited echo   Comparison(s): Echocardiogram done 02/12/20 showed an EF of 40-45%.   FINDINGS   Left Ventricle: Left ventricular ejection fraction, by estimation, is 25  to 30%. The left ventricle has severely decreased function. The left  ventricle demonstrates global hypokinesis. Global longitudinal strain   performed but not reported based on  interpreter judgement due to suboptimal tracking. The left ventricular  internal cavity size was moderately dilated. There is no left ventricular  hypertrophy. Left ventricular diastolic parameters are indeterminate.   Right Ventricle: The right ventricular size is mildly enlarged. Right  ventricular systolic function is mildly reduced.   Mitral Valve: Mild mitral valve regurgitation. No evidence of mitral valve  stenosis.   Tricuspid Valve: The tricuspid valve is abnormal. Tricuspid valve  regurgitation is mild . No evidence of tricuspid stenosis.   Aortic Valve: The aortic valve has an indeterminant number of cusps.  Aortic valve regurgitation is not visualized. Aortic regurgitation PHT  measures 2246 msec. No aortic stenosis is present.   Aorta: The aortic root is normal in size and structure.   Pulmonary Artery: 45.   LEFT VENTRICLE  PLAX 2D  LVIDd:         6.48 cm      Diastology  LVIDs:         5.75 cm      LV e' medial:    2.44 cm/s  LV PW:         0.91 cm      LV E/e' medial:  37.4  LV IVS:        0.76 cm      LV e' lateral:   3.73 cm/s                              LV E/e' lateral: 24.4     LV Volumes (MOD)            2D Longitudinal Strain  LV vol d, MOD A2C: 236.0 ml 2D Strain GLS (A2C):   3.8 %  LV vol d, MOD A4C: 223.0 ml 2D Strain GLS (A3C):   3.6 %  LV vol s, MOD A2C: 179.0 ml 2D Strain GLS (A4C):   3.0 %  LV vol s, MOD A4C: 167.0 ml 2D Strain GLS Avg:     3.5 %  LV SV MOD A2C:     57.0 ml  LV SV MOD A4C:     223.0 ml  LV SV MOD BP:      59.7 ml   RIGHT VENTRICLE  RV S prime:     7.24 cm/s  TAPSE (M-mode): 1.4 cm   LEFT ATRIUM              Index  LA diam:        4.70 cm  2.22 cm/m  LA Vol (A2C):   120.0 ml 56.56 ml/m  LA Vol (A4C):   105.0 ml 49.49 ml/m  LA Biplane Vol: 113.0 ml 53.26 ml/m   AORTIC VALVE  AI PHT:            2246 msec  AR Vena Contracta: 0.30 cm     AORTA  Ao Root diam: 3.50 cm   MITRAL  VALVE                 TRICUSPID VALVE  MV Area (PHT):               TR Peak grad:   45.4 mmHg  MV Decel Time: 140 msec      TR Vmax:        337.00 cm/s  MR Peak grad:    57.0 mmHg  MR Mean grad:    38.0 mmHg  MR Vmax:         377.60 cm/s  MR Vmean:        294.0 cm/s  MR PISA:         1.01 cm  MR PISA Eff ROA: 6 mm  MR PISA Radius:  0.40 cm  MV E velocity: 91.24 cm/s  MV A velocity: 27.30 cm/s  MV E/A ratio:  3.34   Carlyle Dolly MD  Electronically signed by Carlyle Dolly MD  Signature Date/Time: 06/05/2020/2:18:41 PM         Final    Physicians  Panel Physicians Referring Physician Case Authorizing Physician  Larey Dresser, MD (Primary)     Procedures  RIGHT/LEFT HEART CATH AND CORONARY ANGIOGRAPHY   Conclusion      Prox RCA to Mid RCA lesion is 100% stenosed.   1st Mrg lesion is 80% stenosed.   2nd Mrg-1 lesion is 70% stenosed.   2nd Mrg-2 lesion is 80% stenosed.   1st Diag lesion is 80% stenosed.   Dist LM lesion is 30% stenosed.   Ost Cx lesion is 80% stenosed.   2nd Diag lesion is 80% stenosed.   Prox LAD lesion is 75% stenosed.   Mid LAD lesion is 99% stenosed.   1. Normal (low) filling pressures.  2. Preserved cardiac output.  3. Severe 3 vessel disease.  The RCA is occluded with collaterals to the PDA.  The proximal-mid LAD has a long segment of up to 99% stenosis.  The ostial LCx has 80% stenosis.  There is significant disease in the OMs and diagonals.    The patient feels good, he has had no chest pain and on current HF medication regimen, he has minimal dyspnea.  I am going to hydrate him post-cath given CKD 3 and renal transplant.  I will let him go home, may start Eliquis tomorrow morning.  He will need cardiac surgery consult asap (within the next week, will arrange).  He is not a good candidate for PCI, he is a CABG candidate but this will be high risk with low EF (though cardiac output preserved) and CKD.  Would recommend Maze also with CABG.      Procedural Details  Technical Details Procedure: Right Heart Cath, Left Heart Cath, Selective Coronary Angiography  Indication: Cardiomyopathy of uncertain etiology  Procedural Details: The right groin was prepped, draped, and anesthetized with 1% lidocaine. Using the modified Seldinger technique a 5 French sheath was placed in the right femoral artery and a 7 French sheath was placed in the right femoral vein. A Swan-Ganz catheter was used for the right heart catheterization. Standard protocol was followed for recording of right heart pressures and sampling of oxygen saturations. Fick and thermodilution cardiac output was calculated. Standard Judkins catheters were used for selective coronary angiography. There were no immediate procedural complications. The patient was transferrd to the post catheterization recovery area for further monitoring.    Estimated blood loss <50 mL.   During this procedure medications were administered to achieve and maintain moderate conscious sedation while the patient's heart rate, blood pressure, and oxygen saturation were continuously monitored and I was present face-to-face 100% of this time.   Medications (Filter: Administrations occurring from [phone] to 1012 on 09/09/20)  important  Continuous medications are totaled by the amount administered until 09/09/20 1012.   fentaNYL (SUBLIMAZE) injection (mcg) Total dose:  25 mcg Date/Time Rate/Dose/Volume Action   09/09/20 0933 25 mcg Given    midazolam (VERSED) injection (mg) Total dose:  1 mg Date/Time Rate/Dose/Volume Action   09/09/20 0933 1 mg Given    lidocaine (PF) (XYLOCAINE) 1 % injection (mL) Total volume:  10 mL Date/Time Rate/Dose/Volume Action   09/09/20 0937 10 mL Given    Heparin (Porcine) in NaCl 1000-0.9 UT/500ML-% SOLN (mL) Total volume:  1,000 mL Date/Time Rate/Dose/Volume Action   09/09/20 0938 500 mL Given   0938 500 mL Given    iohexol (OMNIPAQUE) 350 MG/ML injection  (mL) Total volume:  45 mL Date/Time Rate/Dose/Volume Action   09/09/20 1009 45 mL Given    0.9 %  sodium chloride infusion (mL/hr) Total volume:  20.02 mL Dosing weight:  86.5 Date/Time Rate/Dose/Volume Action   09/09/20 0956 75 mL/hr Rate/Dose Change    Sedation Time  Sedation Time Physician-1: 31 minutes 25 seconds Contrast  Medication Name Total Dose  iohexol (OMNIPAQUE) 350 MG/ML injection 45 mL   Radiation/Fluoro  Fluoro time: 6.2 (min) DAP: 22.8 (Gycm2) Cumulative Air Kerma: 465.4 (mGy) Coronary Findings  Diagnostic Dominance: Right Left Main  Dist LM lesion is 30% stenosed.  Left Anterior Descending  Prox LAD lesion is 75% stenosed.  Mid LAD lesion is 99% stenosed.  First Diagonal Branch  1st Diag lesion is 80% stenosed.  Second Diagonal Branch  2nd Diag lesion is 80% stenosed.  Left Circumflex  Ost Cx lesion is 80% stenosed.  First Obtuse Marginal Branch  1st Mrg lesion is 80% stenosed.  Second Obtuse Marginal Branch  2nd Mrg-1 lesion is 70% stenosed.  2nd Mrg-2 lesion is 80% stenosed.  Right Coronary Artery  Prox RCA to Mid RCA lesion is 100% stenosed.  Right Posterior Descending Artery  Collaterals  RPDA filled by collaterals from Dist Cx.  Intervention  No interventions have been documented. Right Heart  Right Heart Pressures RHC Procedural Findings: Hemodynamics (mmHg) RA mean 2 RV 21/2 PA 21/7, mean 16 PCWP mean 11 LV 111/8 AO 111/58  Oxygen saturations: PA 69% AO 100%  Cardiac Output (Fick) 4.5  Cardiac Index (Fick) 2.21  Cardiac Output (Thermo) 4.76 Cardiac Index (Thermo) 2.33   Coronary Diagrams  Diagnostic Dominance: Right Intervention  Implants     No implant documentation for this case.   Syngo Images   Show images for CARDIAC CATHETERIZATION Images on Long Term Storage   Show images for Marcelino, Campos to Procedure Log  Procedure Log    Hemo Data  Flowsheet Row Most Recent Value  Fick Cardiac  Output 4.5 L/min  Fick Cardiac Output Index 2.21 (L/min)/BSA  Thermal Cardiac Output 4.76 L/min  Thermal Cardiac Output Index 2.33 (L/min)/BSA  RA A Wave 2 mmHg  RA V Wave 4 mmHg  RA Mean 2 mmHg  RV Systolic Pressure 21 mmHg  RV Diastolic Pressure 0 mmHg  RV EDP 2 mmHg  PA Systolic Pressure 21 mmHg  PA Diastolic Pressure 7 mmHg  PA Mean 16 mmHg  PW A Wave 12 mmHg  PW V Wave 13 mmHg  PW Mean 11 mmHg  AO Systolic Pressure 161 mmHg  AO Diastolic Pressure 58 mmHg  AO Mean 77 mmHg  LV Systolic Pressure 096 mmHg  LV Diastolic Pressure 5 mmHg  LV EDP 6 mmHg  AOp Systolic Pressure 045 mmHg  AOp Diastolic Pressure 59 mmHg  AOp Mean Pressure 79 mmHg  LVp Systolic Pressure 409 mmHg  LVp Diastolic Pressure 3 mmHg  LVp EDP Pressure 6 mmHg  TPVR Index 6.86 HRUI  TSVR Index 33.01 HRUI  PVR SVR Ratio 0.07  TPVR/TSVR Ratio 0.21      Impression:  This 75 year old gentleman has severe three-vessel coronary disease with severe left ventricular systolic dysfunction and an ejection fraction of 25 to 30% by echocardiogram in April 2022.  He had a nuclear stress test in March 2022 which showed large fixed defects in the LAD and RCA distribution with no sign of ischemia and a low ejection fraction of 21%.  He is essentially asymptomatic at this time with no angina.  He has graftable vessels but I think his operative risk would be increased due to the combination of his age, renal transplant with stage III chronic kidney disease, and low ejection fraction.  I think it is important to be sure that he has viable myocardium before considering putting him through a high risk operation.  His nuclear stress test did not show any ischemia and his electrocardiogram shows prior anterolateral and inferior MIs.  I think a cardiac MR viability scan may be useful for determining if he has viable myocardium and potential benefit from revascularization.  He also has persistent atrial fibrillation and may benefit from a  biatrial maze procedure although that does lengthen the surgical procedure and may increase the risk further.  I reviewed his cardiac catheterization films with him and his daughter and answered all of their questions.  I reviewed the results of his previous echo and nuclear stress test and rationale for a cardiac MR study.  He seems understand is in full agreement.   Plan:  He will be scheduled for a cardiac MR viability study and then I told him I will call him with the results and we can decide about coronary bypass surgery after that.  I spent 60 minutes performing  this consultation and > 50% of this time was spent face to face counseling and coordinating the care of this patient's severe three-vessel coronary disease with severe left ventricular systolic dysfunction and persistent atrial fibrillation.   Gaye Pollack, MD [facility] [phone]

## 2020-10-03 ENCOUNTER — Other Ambulatory Visit: Payer: Self-pay

## 2020-10-03 ENCOUNTER — Other Ambulatory Visit (HOSPITAL_COMMUNITY): Payer: Self-pay

## 2020-10-03 ENCOUNTER — Telehealth (HOSPITAL_COMMUNITY): Payer: Self-pay

## 2020-10-03 ENCOUNTER — Encounter (HOSPITAL_COMMUNITY): Payer: Self-pay | Admitting: Cardiology

## 2020-10-03 ENCOUNTER — Ambulatory Visit (HOSPITAL_COMMUNITY)
Admission: RE | Admit: 2020-10-03 | Discharge: 2020-10-03 | Disposition: A | Discharge status: Home / Self Care | Payer: Medicare Other | Source: Ambulatory Visit | Attending: Cardiology | Admitting: Cardiology

## 2020-10-03 ENCOUNTER — Telehealth (HOSPITAL_COMMUNITY): Payer: Self-pay | Admitting: Pharmacy Technician

## 2020-10-03 VITALS — BP 140/80 | HR 72 | Wt 189.6 lb

## 2020-10-03 DIAGNOSIS — I130 Hypertensive heart and chronic kidney disease with heart failure and stage 1 through stage 4 chronic kidney disease, or unspecified chronic kidney disease: Secondary | ICD-10-CM | POA: Insufficient documentation

## 2020-10-03 DIAGNOSIS — I4819 Other persistent atrial fibrillation: Secondary | ICD-10-CM | POA: Diagnosis not present

## 2020-10-03 DIAGNOSIS — Z8249 Family history of ischemic heart disease and other diseases of the circulatory system: Secondary | ICD-10-CM | POA: Insufficient documentation

## 2020-10-03 DIAGNOSIS — I5042 Chronic combined systolic (congestive) and diastolic (congestive) heart failure: Secondary | ICD-10-CM

## 2020-10-03 DIAGNOSIS — E1122 Type 2 diabetes mellitus with diabetic chronic kidney disease: Secondary | ICD-10-CM | POA: Insufficient documentation

## 2020-10-03 DIAGNOSIS — I5022 Chronic systolic (congestive) heart failure: Secondary | ICD-10-CM | POA: Diagnosis not present

## 2020-10-03 DIAGNOSIS — Z794 Long term (current) use of insulin: Secondary | ICD-10-CM | POA: Insufficient documentation

## 2020-10-03 DIAGNOSIS — Z940 Kidney transplant status: Secondary | ICD-10-CM | POA: Diagnosis not present

## 2020-10-03 DIAGNOSIS — I4891 Unspecified atrial fibrillation: Secondary | ICD-10-CM

## 2020-10-03 DIAGNOSIS — Z8616 Personal history of COVID-19: Secondary | ICD-10-CM | POA: Insufficient documentation

## 2020-10-03 DIAGNOSIS — I25119 Atherosclerotic heart disease of native coronary artery with unspecified angina pectoris: Secondary | ICD-10-CM | POA: Diagnosis not present

## 2020-10-03 DIAGNOSIS — Z7901 Long term (current) use of anticoagulants: Secondary | ICD-10-CM | POA: Diagnosis not present

## 2020-10-03 DIAGNOSIS — Z79899 Other long term (current) drug therapy: Secondary | ICD-10-CM | POA: Diagnosis not present

## 2020-10-03 DIAGNOSIS — Z833 Family history of diabetes mellitus: Secondary | ICD-10-CM | POA: Diagnosis not present

## 2020-10-03 DIAGNOSIS — I255 Ischemic cardiomyopathy: Secondary | ICD-10-CM | POA: Insufficient documentation

## 2020-10-03 DIAGNOSIS — Z8673 Personal history of transient ischemic attack (TIA), and cerebral infarction without residual deficits: Secondary | ICD-10-CM | POA: Insufficient documentation

## 2020-10-03 DIAGNOSIS — I214 Non-ST elevation (NSTEMI) myocardial infarction: Secondary | ICD-10-CM | POA: Diagnosis not present

## 2020-10-03 DIAGNOSIS — N183 Chronic kidney disease, stage 3 unspecified: Secondary | ICD-10-CM | POA: Diagnosis not present

## 2020-10-03 DIAGNOSIS — I2510 Atherosclerotic heart disease of native coronary artery without angina pectoris: Secondary | ICD-10-CM

## 2020-10-03 LAB — COMPREHENSIVE METABOLIC PANEL
ALT: 19 U/L (ref 0–44)
AST: 21 U/L (ref 15–41)
Albumin: 4.1 g/dL (ref 3.5–5.0)
Alkaline Phosphatase: 48 U/L (ref 38–126)
Anion gap: 7 (ref 5–15)
BUN: 56 mg/dL — ABNORMAL HIGH (ref 8–23)
CO2: 28 mmol/L (ref 22–32)
Calcium: 9.3 mg/dL (ref 8.9–10.3)
Chloride: 102 mmol/L (ref 98–111)
Creatinine, Ser: 2.16 mg/dL — ABNORMAL HIGH (ref 0.61–1.24)
GFR, Estimated: 31 mL/min — ABNORMAL LOW (ref >60)
Glucose, Bld: 135 mg/dL — ABNORMAL HIGH (ref 70–99)
Potassium: 5.4 mmol/L — ABNORMAL HIGH (ref 3.5–5.1)
Sodium: 137 mmol/L (ref 135–145)
Total Bilirubin: 1.3 mg/dL — ABNORMAL HIGH (ref 0.3–1.2)
Total Protein: 6.7 g/dL (ref 6.5–8.1)

## 2020-10-03 LAB — CBC
HCT: 48.3 % (ref 39.0–52.0)
Hemoglobin: 15.2 g/dL (ref 13.0–17.0)
MCH: 29.2 pg (ref 26.0–34.0)
MCHC: 31.5 g/dL (ref 30.0–36.0)
MCV: 92.9 fL (ref 80.0–100.0)
Platelets: 123 10*3/uL — ABNORMAL LOW (ref 150–400)
RBC: 5.2 MIL/uL (ref 4.22–5.81)
RDW: 13.6 % (ref 11.5–15.5)
WBC: 5.9 10*3/uL (ref 4.0–10.5)
nRBC: 0 % (ref 0.0–0.2)

## 2020-10-03 LAB — TSH: TSH: 3.063 u[IU]/mL (ref 0.350–4.500)

## 2020-10-03 MED ORDER — ENTRESTO 24-26 MG PO TABS: 1.0000 | ORAL_TABLET | Freq: Two times a day (BID) | ORAL | 3 refills | Status: DC

## 2020-10-03 MED ORDER — FUROSEMIDE 40 MG PO TABS: 40.0000 mg | ORAL_TABLET | Freq: Every day | ORAL | 3 refills | Status: DC

## 2020-10-03 NOTE — Telephone Encounter (Signed)
Patients daughter advised and verbalized understanding.  Will have repeat labs done in [place], med list updated to reflect changes.

## 2020-10-03 NOTE — Patient Instructions (Signed)
EKG done today.  Labs done today. We will contact you only if your labs are abnormal.  START Entresto 24-26mg  (1 tablet) by mouth 2 times daily.  DECREASE Lasix to 40mg  (1 tablet) by mouth daily.   No other medication changes were made. Please continue all current medications as prescribed.  Your physician recommends that you schedule a follow-up appointment in: 4 weeks  If you have any questions or concerns before your next appointment please send [place] a message through [place] or call our office at [phone].    TO LEAVE A MESSAGE FOR THE NURSE SELECT OPTION 2, PLEASE LEAVE A MESSAGE INCLUDING: YOUR NAME DATE OF BIRTH CALL BACK NUMBER REASON FOR CALL**this is important as we prioritize the call backs  YOU WILL RECEIVE A CALL BACK THE SAME DAY AS LONG AS YOU CALL BEFORE 4:00 PM   Do the following things EVERYDAY: Weigh yourself in the morning before breakfast. Write it down and keep it in a log. Take your medicines as prescribed Eat low salt foods--Limit salt (sodium) to 2000 mg per day.  Stay as active as you can everyday Limit all fluids for the day to less than 2 liters   At the [facility], you and your health needs are our priority. As part of our continuing mission to provide you with exceptional heart care, we have created designated Provider Care Teams. These Care Teams include your primary Cardiologist (physician) and Advanced Practice Providers (APPs- Physician Assistants and Nurse Practitioners) who all work together to provide you with the care you need, when you need it.   You may see any of the following providers on your designated Care Team at your next follow up: Dr Glori Bickers Dr Haynes Kerns, NP Lyda Jester, [place] Audry Riles, PharmD   Please be sure to bring in all your medications bottles to every appointment.    You are scheduled for a Cardioversion on Thursday September 8th 2022 with Dr. Loralie Champagne.  Please  arrive at the [facility] (Main Entrance A) at [facility]: [address] [place], [place] [address] at 10 am. (1 hour prior to procedure unless lab work is needed; if lab work is needed arrive 1.5 hours ahead)  DIET: Nothing to eat or drink after midnight except a sip of water with medications (see medication instructions below)  FYI: For your safety, and to allow [place] to monitor your vital signs accurately during the surgery/procedure we request that   if you have artificial nails, gel coating, SNS etc. Please have those removed prior to your surgery/procedure. Not having the nail coverings /polish removed may result in cancellation or delay of your surgery/procedure.  Medication Instructions: Hold Lasix the day of your procedure  You must have a responsible person to drive you home and stay in the waiting area during your procedure. Failure to do so could result in cancellation.  Bring your insurance cards.  *Special Note: Every effort is made to have your procedure done on time. Occasionally there are emergencies that occur at the hospital that may cause delays. Please be patient if a delay does occur.

## 2020-10-03 NOTE — Telephone Encounter (Signed)
-----   Message from Larey Dresser, MD sent at 10/03/2020  4:03 PM EDT ----- K high and creatinine higher than prior.  Decrease Lasix to 40 mg daily and do not start Entresto for now.  Follow low K diet.  BMET 1 week.

## 2020-10-03 NOTE — Telephone Encounter (Signed)
Advanced Heart Failure Patient Advocate Encounter  Patient was seen in clinic today and started on Entresto. Current 90 day co-pay, $463. The 30 day co-pay, $160. The patient is going into the donut hole. Was able to obtain a PAN HF grant to cover the cost.   Member ID: [id number] Group ID: [id number] RxBin ID: [id number] PCN: [id number] Eligibility Start Date: 07/05/2020 Eligibility End Date: 10/02/2021 Assistance Amount: $1,000.00  The patient was given a copy of the grant information to take to the pharmacy. Advised Philicia (CMA) to send a 90 day RX with the grant information attached.   Charlann Boxer, CPhT

## 2020-10-04 NOTE — Progress Notes (Addendum)
PCP: Claretta Fraise, MD Cardiology: Dr. Domenic Polite HF Cardiology: Dr. Aundra Dubin  75 y.o. with history of suspected CAD/NSTEMI, CKD stage 3 s/p renal transplant, chronic systolic CHF, and persistent atrial fibrillation was referred by Dr. Domenic Polite for evaluation of CHF, atrial fibrillation, and suspected CAD.  Patient was doing fairly well until 1/22.  He developed COVID-19 PNA.  Hospitalization was complicated by NSTEMI and the onset of atrial fibrillation.  Cath was not done with elevated creatinine.  Echo in 1/22 showed EF 40-45% with wall motion abnormalities.  Cardiolite done as an outpatient in 3/22 showed EF 21%, inferior and inferolateral fixed defect.    Patient had CVA in 2/22, likely related to atrial fibrillation.  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  He did PT.   Repeat echo in 4/22 showed EF down further to 25-30%.  RHC/LHC was done in 8/22.  This showed severe 3VD with occluded RCA with collaterals, 99% proximal-mid LAD stenosis, 80% ostial LCx, significant disease in OMs and diagonals.  RHC showed normal filling pressures and preserved cardiac output.   Patient returns for followup of CHF, CAD, and atrial fibrillation.  He remains in rate-controlled atrial fibrillation.  No dyspnea walking on flat ground, uses walker or cane for balance. Dyspnea with stairs, inclines.  No orthopnea/PND.  He gets chest pain with heavy exertion such as heavy housework and carrying a heavy load, no chest pain with usual ADLs. No lightheadedness.     ECG (personally reviewed): NSR, anterolateral Qs, inferior Qs  Labs (6/22): LDL 92, HDL 40, K 5.1, creatinine 1.7 Labs (8/22): K 5.4, creatinine 2.16, LFTs normal, TSH normal  PMH: 1. Type 2 diabetes 2. HTN 3. Hyperlipidemia 4. H/o COVID-19 PNA in 1/22.  5. CKD: Stage 3.  History of renal transplant.   6. 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  7. Thrombocytopenia 8. Skin cancer 9. OSA 10. CAD: NSTEMI in 1/22  during admission for COVID PNA.  HS-TnI to 8647.  Not cathed with elevated creatinine.  - Cardiolite (3/22): EF 21%, inferior and inferolateral fixed defect, apical fixed defect.  - LHC (8/22): Severe 3VD with occluded RCA with collaterals, 99% proximal-mid LAD stenosis, 80% ostial LCx, significant disease in OMs and diagonals. 11. Chronic systolic CHF: Ischemic cardiomyopathy.  - Echo (1/22): EF 40-45%, wall motion abnormalities.  - Echo (4/22): EF 25-30%, global hypokinesis.  - RHC/LHC (8/22): severe 3VD with occluded RCA with collaterals, 99% proximal-mid LAD stenosis, 80% ostial LCx, significant disease in OMs and diagonals. Mean RA 2, PA 21/7, mean PCWP 11, CI 2.2 Fick/2.33 thermodilution 12. Atrial fibrillation: Persistent since 1/22.   Social History   Socioeconomic History   Marital status: Married    Spouse name: Not on file   Number of children: Not on file   Years of education: Not on file   Highest education level: Not on file  Occupational History   Not on file  Tobacco Use   Smoking status: Some Days    Types: Pipe   Smokeless tobacco: Never  Vaping Use   Vaping Use: Never used  Substance and Sexual Activity   Alcohol use: Never   Drug use: Never   Sexual activity: Not on file  Other Topics Concern   Not on file  Social History Narrative   Not on file   Social Determinants of Health   Financial Resource Strain: Low Risk    Difficulty of Paying Living Expenses: Not hard at all  Food Insecurity: No Food Insecurity   Worried About Charity fundraiser in the Last Year: Never true   Ran Out of Food in the Last Year: Never true  Transportation Needs: No Transportation Needs   Lack of Transportation (Medical): No   Lack of Transportation (Non-Medical): No  Physical Activity: Inactive   Days of Exercise per Week: 0 days   Minutes of Exercise per Session: 0 min  Stress: Not on file  Social Connections: Socially Isolated   Frequency of Communication with Friends  and Family: More than three times a week   Frequency of Social Gatherings with Friends and Family: More than three times a week   Attends Religious Services: Never   Marine scientist or Organizations: No   Attends Archivist Meetings: Never   Marital Status: Widowed  Human resources officer Violence: Not on file   Family History  Problem Relation Age of Onset   Clotting disorder Mother    Hypertension Sister    Diabetes Sister    ROS: All systems reviewed and negative except as per HPI.   Current Outpatient Medications  Medication Sig Dispense Refill   acetaminophen (TYLENOL) 500 MG tablet Take 500 mg by mouth every 8 (eight) hours as needed for moderate pain.     albuterol (VENTOLIN HFA) 108 (90 Base) MCG/ACT inhaler Inhale 2 puffs into the lungs every 6 (six) hours as needed for wheezing or shortness of breath. 1 each 3   ALPRAZolam (XANAX) 0.25 MG tablet Take 0.5 mg by mouth at bedtime as needed for anxiety.     amiodarone (PACERONE) 200 MG tablet Take 1 tablet (200 mg total) by mouth daily. 30 tablet 11   apixaban (ELIQUIS) 5 MG TABS tablet Take 1 tablet (5 mg total) by mouth 2 (two) times daily. 60 tablet 1   ascorbic acid (VITAMIN C) 500 MG tablet Take 1 tablet (500 mg total) by mouth daily.     atorvastatin (LIPITOR) 10 MG tablet Take 1 tablet (10 mg total) by mouth daily. 90 tablet 1   blood glucose meter kit and supplies Dispense based on patient and insurance preference. Use up to four times daily as directed. (FOR ICD-10 E10.9, E11.9). 1 each 0   ezetimibe (ZETIA) 10 MG tablet Take 1 tablet (10 mg total) by mouth daily. 30 tablet 11   fluticasone (FLONASE) 50 MCG/ACT nasal spray USE ONE SPRAY(S) IN EACH NOSTRIL ONCE DAILY 16 g 11   insulin NPH Human (NOVOLIN N) 100 UNIT/ML injection 10 units AC breakfast and 10 units AC supper 30 mL 11   Insulin Regular Human 100 UNIT/ML SOPN Inject 16 units before breakfast and 10 before supper 10 mL prn   INVOKANA 300 MG TABS  tablet TAKE 1 TABLET BY MOUTH ONCE DAILY BEFORE BREAKFAST 30 tablet 1   loratadine (CLARITIN) 10 MG tablet Take 10 mg by mouth daily as needed for allergies.     metoprolol succinate (TOPROL XL) 50 MG 24 hr tablet Take 1.5 tablets (75 mg total) by mouth daily. Take with or immediately following a meal. 45 tablet 11   mycophenolate (CELLCEPT) 250 MG capsule Take 500 mg by mouth 2 (two) times daily.     ondansetron (ZOFRAN) 4 MG tablet Take 1 tablet (4 mg total) by mouth every 8 (eight) hours as needed for nausea or vomiting. 20 tablet 0   predniSONE (DELTASONE) 5 MG tablet Take 5 mg by mouth daily with breakfast.     RELION INSULIN SYRINGE  31G X 15/64" 0.5 ML MISC USE AS DIRECTED FOR INSULIN     sacubitril-valsartan (ENTRESTO) 24-26 MG Take 1 tablet by mouth 2 (two) times daily. 180 tablet 3   sodium bicarbonate 650 MG tablet Take 650 mg by mouth 2 (two) times daily.     spironolactone (ALDACTONE) 25 MG tablet Take 0.5 tablets (12.5 mg total) by mouth daily.     tacrolimus (PROGRAF) 0.5 MG capsule Take 1 mg by mouth in the morning and at bedtime.     zinc sulfate 220 (50 Zn) MG capsule Take 1 capsule (220 mg total) by mouth daily.     furosemide (LASIX) 40 MG tablet Take 1 tablet (40 mg total) by mouth daily. 90 tablet 3   No current facility-administered medications for this encounter.   BP 140/80   Pulse 72   Wt 86 kg (189 lb 9.6 oz)   SpO2 99%   BMI 27.20 kg/m  General: NAD Neck: No JVD, no thyromegaly or thyroid nodule.  Lungs: Clear to auscultation bilaterally with normal respiratory effort. CV: Nondisplaced PMI.  Heart irregular S1/S2, no S3/S4, no murmur.  Trace ankle edema.  No carotid bruit.  Normal pedal pulses.  Abdomen: Soft, nontender, no hepatosplenomegaly, no distention.  Skin: Intact without lesions or rashes.  Neurologic: Alert and oriented x 3.  Psych: Normal affect. Extremities: No clubbing or cyanosis.  HEENT: Normal.   Assessment/Plan: 1. Chronic systolic CHF:  Echo in 9/76 with EF 25-30%.   Patient had NSTEMI during 1/22 admission with COVID-19, not cathed with CKD.  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 RHC in 8/22 with preserved cardiac output and low filling pressures.  He is not volume overloaded, NYHA class II-III symptoms.  Creatinine today 2.1.   - Decrease Lasix to 40 mg daily, BMET 10 days.   - Continue Toprol XL 75 mg daily.  - Continue spironolactone 12.5 daily.  - Continue canagliflozin.   - Will not start Entresto until creatinine is stabilized.  - I think that the patient would benefit from coronary revascularization, see below.  2. CAD: Cath in 8/22 with severe 3VD, occluded RCA with collaterals, 99% proximal-mid LAD stenosis, 80% ostial LCx, significant disease in OMs and diagonals.  With low EF and 3VD, I recommended CABG.  He saw Dr. Cyndia Bent, and is adamant that he does not want to have surgery.  Dr. Cyndia Bent wanted him to get an MRI for viability, but if he is not going to have CABG, would not get MRI.  He has chest pain with heavier exertion but not with normal ADLs.  He is open to PCI if we think it would benefit him.  Based on most recent data on percutaneous revascularization for ischemic cardiomyopathy, there may not be much benefit to PCI.  However, he does have angina.  - Continue atorvastatin and Zetia.  - He is not on ASA due to Eliquis use.  - I will review cath with interventional colleagues to decide on PCI attempt.  3. Atrial fibrillation: Persistent since 1/22.  No attempt at DCCV as of yet. He would likely benefit from NSR given CHF.  -  DCCV tentatively set up for early September, after 4 weeks of continuous anticoagulation.  If we decide on PCI, this may need to be postponed.   -  Given CHF and prolonged atrial fibrillation, he is on amiodarone to give him the best chance of holding NSR.  With his renal transplant meds, will only use amiodarone 200 mg daily  and allow slow load. Check LFTs and TSH today, he will need  regular eye exam while on amiodarone.  -  Consider AF ablation eventually if he is able to regain NSR.  This may allow [place] to stop amiodarone.  4. 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  5. CKD: stage 3.  Post-transplant.   6. Hyperlipidemia: He is on atorvastatin 10 mg daily, cannot increase with renal transplant meds. LDL is above goal.  - Added Zetia 10 mg daily with lipids in 1 more month.  If LDL still too high, send to lipid clinic for [place].   Loralie Champagne 10/04/2020  I reviewed cath films with Dr. Burt Knack.  Could potentially intervene on LAD, but would essentially be a CTO procedure with increased risk to the patient (including worsening renal function).  Based on recently released REVIVED-BCIS trial, I do not think that Mr. Rena would derive much benefit from PCI in this situation in the absence of significant angina (which he does not seem to have.   Will plan medical management and DCCV as above.   Loralie Champagne 10/06/2020 2:18 PM

## 2020-10-04 NOTE — H&P (View-Only) (Signed)
PCP: Claretta Fraise, MD Cardiology: Dr. Domenic Polite HF Cardiology: Dr. Aundra Dubin  75 y.o. with history of suspected CAD/NSTEMI, CKD stage 3 s/p renal transplant, chronic systolic CHF, and persistent atrial fibrillation was referred by Dr. Domenic Polite for evaluation of CHF, atrial fibrillation, and suspected CAD.  Patient was doing fairly well until 1/22.  He developed COVID-19 PNA.  Hospitalization was complicated by NSTEMI and the onset of atrial fibrillation.  Cath was not done with elevated creatinine.  Echo in 1/22 showed EF 40-45% with wall motion abnormalities.  Cardiolite done as an outpatient in 3/22 showed EF 21%, inferior and inferolateral fixed defect.    Patient had CVA in 2/22, likely related to atrial fibrillation.  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  He did PT.   Repeat echo in 4/22 showed EF down further to 25-30%.  RHC/LHC was done in 8/22.  This showed severe 3VD with occluded RCA with collaterals, 99% proximal-mid LAD stenosis, 80% ostial LCx, significant disease in OMs and diagonals.  RHC showed normal filling pressures and preserved cardiac output.   Patient returns for followup of CHF, CAD, and atrial fibrillation.  He remains in rate-controlled atrial fibrillation.  No dyspnea walking on flat ground, uses walker or cane for balance. Dyspnea with stairs, inclines.  No orthopnea/PND.  He gets chest pain with heavy exertion such as heavy housework and carrying a heavy load, no chest pain with usual ADLs. No lightheadedness.     ECG (personally reviewed): NSR, anterolateral Qs, inferior Qs  Labs (6/22): LDL 92, HDL 40, K 5.1, creatinine 1.7 Labs (8/22): K 5.4, creatinine 2.16, LFTs normal, TSH normal  PMH: 1. Type 2 diabetes 2. HTN 3. Hyperlipidemia 4. H/o COVID-19 PNA in 1/22.  5. CKD: Stage 3.  History of renal transplant.   6. 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  7. Thrombocytopenia 8. Skin cancer 9. OSA 10. CAD: NSTEMI in 1/22  during admission for COVID PNA.  HS-TnI to 8647.  Not cathed with elevated creatinine.  - Cardiolite (3/22): EF 21%, inferior and inferolateral fixed defect, apical fixed defect.  - LHC (8/22): Severe 3VD with occluded RCA with collaterals, 99% proximal-mid LAD stenosis, 80% ostial LCx, significant disease in OMs and diagonals. 11. Chronic systolic CHF: Ischemic cardiomyopathy.  - Echo (1/22): EF 40-45%, wall motion abnormalities.  - Echo (4/22): EF 25-30%, global hypokinesis.  - RHC/LHC (8/22): severe 3VD with occluded RCA with collaterals, 99% proximal-mid LAD stenosis, 80% ostial LCx, significant disease in OMs and diagonals. Mean RA 2, PA 21/7, mean PCWP 11, CI 2.2 Fick/2.33 thermodilution 12. Atrial fibrillation: Persistent since 1/22.   Social History   Socioeconomic History   Marital status: Married    Spouse name: Not on file   Number of children: Not on file   Years of education: Not on file   Highest education level: Not on file  Occupational History   Not on file  Tobacco Use   Smoking status: Some Days    Types: Pipe   Smokeless tobacco: Never  Vaping Use   Vaping Use: Never used  Substance and Sexual Activity   Alcohol use: Never   Drug use: Never   Sexual activity: Not on file  Other Topics Concern   Not on file  Social History Narrative   Not on file   Social Determinants of Health   Financial Resource Strain: Low Risk    Difficulty of Paying Living Expenses: Not hard at all  Food Insecurity: No Food Insecurity   Worried About Charity fundraiser in the Last Year: Never true   Ran Out of Food in the Last Year: Never true  Transportation Needs: No Transportation Needs   Lack of Transportation (Medical): No   Lack of Transportation (Non-Medical): No  Physical Activity: Inactive   Days of Exercise per Week: 0 days   Minutes of Exercise per Session: 0 min  Stress: Not on file  Social Connections: Socially Isolated   Frequency of Communication with Friends  and Family: More than three times a week   Frequency of Social Gatherings with Friends and Family: More than three times a week   Attends Religious Services: Never   Marine scientist or Organizations: No   Attends Archivist Meetings: Never   Marital Status: Widowed  Human resources officer Violence: Not on file   Family History  Problem Relation Age of Onset   Clotting disorder Mother    Hypertension Sister    Diabetes Sister    ROS: All systems reviewed and negative except as per HPI.   Current Outpatient Medications  Medication Sig Dispense Refill   acetaminophen (TYLENOL) 500 MG tablet Take 500 mg by mouth every 8 (eight) hours as needed for moderate pain.     albuterol (VENTOLIN HFA) 108 (90 Base) MCG/ACT inhaler Inhale 2 puffs into the lungs every 6 (six) hours as needed for wheezing or shortness of breath. 1 each 3   ALPRAZolam (XANAX) 0.25 MG tablet Take 0.5 mg by mouth at bedtime as needed for anxiety.     amiodarone (PACERONE) 200 MG tablet Take 1 tablet (200 mg total) by mouth daily. 30 tablet 11   apixaban (ELIQUIS) 5 MG TABS tablet Take 1 tablet (5 mg total) by mouth 2 (two) times daily. 60 tablet 1   ascorbic acid (VITAMIN C) 500 MG tablet Take 1 tablet (500 mg total) by mouth daily.     atorvastatin (LIPITOR) 10 MG tablet Take 1 tablet (10 mg total) by mouth daily. 90 tablet 1   blood glucose meter kit and supplies Dispense based on patient and insurance preference. Use up to four times daily as directed. (FOR ICD-10 E10.9, E11.9). 1 each 0   ezetimibe (ZETIA) 10 MG tablet Take 1 tablet (10 mg total) by mouth daily. 30 tablet 11   fluticasone (FLONASE) 50 MCG/ACT nasal spray USE ONE SPRAY(S) IN EACH NOSTRIL ONCE DAILY 16 g 11   insulin NPH Human (NOVOLIN N) 100 UNIT/ML injection 10 units AC breakfast and 10 units AC supper 30 mL 11   Insulin Regular Human 100 UNIT/ML SOPN Inject 16 units before breakfast and 10 before supper 10 mL prn   INVOKANA 300 MG TABS  tablet TAKE 1 TABLET BY MOUTH ONCE DAILY BEFORE BREAKFAST 30 tablet 1   loratadine (CLARITIN) 10 MG tablet Take 10 mg by mouth daily as needed for allergies.     metoprolol succinate (TOPROL XL) 50 MG 24 hr tablet Take 1.5 tablets (75 mg total) by mouth daily. Take with or immediately following a meal. 45 tablet 11   mycophenolate (CELLCEPT) 250 MG capsule Take 500 mg by mouth 2 (two) times daily.     ondansetron (ZOFRAN) 4 MG tablet Take 1 tablet (4 mg total) by mouth every 8 (eight) hours as needed for nausea or vomiting. 20 tablet 0   predniSONE (DELTASONE) 5 MG tablet Take 5 mg by mouth daily with breakfast.     RELION INSULIN SYRINGE  31G X 15/64" 0.5 ML MISC USE AS DIRECTED FOR INSULIN     sacubitril-valsartan (ENTRESTO) 24-26 MG Take 1 tablet by mouth 2 (two) times daily. 180 tablet 3   sodium bicarbonate 650 MG tablet Take 650 mg by mouth 2 (two) times daily.     spironolactone (ALDACTONE) 25 MG tablet Take 0.5 tablets (12.5 mg total) by mouth daily.     tacrolimus (PROGRAF) 0.5 MG capsule Take 1 mg by mouth in the morning and at bedtime.     zinc sulfate 220 (50 Zn) MG capsule Take 1 capsule (220 mg total) by mouth daily.     furosemide (LASIX) 40 MG tablet Take 1 tablet (40 mg total) by mouth daily. 90 tablet 3   No current facility-administered medications for this encounter.   BP 140/80   Pulse 72   Wt 86 kg (189 lb 9.6 oz)   SpO2 99%   BMI 27.20 kg/m  General: NAD Neck: No JVD, no thyromegaly or thyroid nodule.  Lungs: Clear to auscultation bilaterally with normal respiratory effort. CV: Nondisplaced PMI.  Heart irregular S1/S2, no S3/S4, no murmur.  Trace ankle edema.  No carotid bruit.  Normal pedal pulses.  Abdomen: Soft, nontender, no hepatosplenomegaly, no distention.  Skin: Intact without lesions or rashes.  Neurologic: Alert and oriented x 3.  Psych: Normal affect. Extremities: No clubbing or cyanosis.  HEENT: Normal.   Assessment/Plan: 1. Chronic systolic CHF:  Echo in 6/94 with EF 25-30%.   Patient had NSTEMI during 1/22 admission with COVID-19, not cathed with CKD.  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 RHC in 8/22 with preserved cardiac output and low filling pressures.  He is not volume overloaded, NYHA class II-III symptoms.  Creatinine today 2.1.   - Decrease Lasix to 40 mg daily, BMET 10 days.   - Continue Toprol XL 75 mg daily.  - Continue spironolactone 12.5 daily.  - Continue canagliflozin.   - Will not start Entresto until creatinine is stabilized.  - I think that the patient would benefit from coronary revascularization, see below.  2. CAD: Cath in 8/22 with severe 3VD, occluded RCA with collaterals, 99% proximal-mid LAD stenosis, 80% ostial LCx, significant disease in OMs and diagonals.  With low EF and 3VD, I recommended CABG.  He saw Dr. Cyndia Bent, and is adamant that he does not want to have surgery.  Dr. Cyndia Bent wanted him to get an MRI for viability, but if he is not going to have CABG, would not get MRI.  He has chest pain with heavier exertion but not with normal ADLs.  He is open to PCI if we think it would benefit him.  Based on most recent data on percutaneous revascularization for ischemic cardiomyopathy, there may not be much benefit to PCI.  However, he does have angina.  - Continue atorvastatin and Zetia.  - He is not on ASA due to Eliquis use.  - I will review cath with interventional colleagues to decide on PCI attempt.  3. Atrial fibrillation: Persistent since 1/22.  No attempt at DCCV as of yet. He would likely benefit from NSR given CHF.  -  DCCV tentatively set up for early September, after 4 weeks of continuous anticoagulation.  If we decide on PCI, this may need to be postponed.   -  Given CHF and prolonged atrial fibrillation, he is on amiodarone to give him the best chance of holding NSR.  With his renal transplant meds, will only use amiodarone 200 mg daily  and allow slow load. Check LFTs and TSH today, he will need  regular eye exam while on amiodarone.  -  Consider AF ablation eventually if he is able to regain NSR.  This may allow [place] to stop amiodarone.  4. 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  5. CKD: stage 3.  Post-transplant.   6. Hyperlipidemia: He is on atorvastatin 10 mg daily, cannot increase with renal transplant meds. LDL is above goal.  - Added Zetia 10 mg daily with lipids in 1 more month.  If LDL still too high, send to lipid clinic for [place].   Loralie Champagne 10/04/2020  I reviewed cath films with Dr. Burt Knack.  Could potentially intervene on LAD, but would essentially be a CTO procedure with increased risk to the patient (including worsening renal function).  Based on recently released REVIVED-BCIS trial, I do not think that Mr. Denunzio would derive much benefit from PCI in this situation in the absence of significant angina (which he does not seem to have.   Will plan medical management and DCCV as above.   Loralie Champagne 10/06/2020 2:18 PM

## 2020-10-06 NOTE — Addendum Note (Signed)
Encounter addended by: Larey Dresser, MD on: 10/06/2020 2:19 PM  Actions taken: Clinical Note Signed

## 2020-10-10 ENCOUNTER — Other Ambulatory Visit: Payer: Medicare Other

## 2020-10-10 ENCOUNTER — Other Ambulatory Visit (HOSPITAL_COMMUNITY): Payer: Self-pay | Admitting: *Deleted

## 2020-10-10 ENCOUNTER — Other Ambulatory Visit: Payer: Self-pay

## 2020-10-10 DIAGNOSIS — I5042 Chronic combined systolic (congestive) and diastolic (congestive) heart failure: Secondary | ICD-10-CM

## 2020-10-16 ENCOUNTER — Ambulatory Visit (HOSPITAL_COMMUNITY): Payer: Medicare Other | Admitting: Anesthesiology

## 2020-10-16 ENCOUNTER — Encounter (HOSPITAL_COMMUNITY)
Admission: RE | Disposition: A | Discharge status: Home / Self Care | Payer: Self-pay | Source: Home / Self Care | Attending: Cardiology

## 2020-10-16 ENCOUNTER — Encounter (HOSPITAL_COMMUNITY): Payer: Self-pay | Admitting: Cardiology

## 2020-10-16 ENCOUNTER — Ambulatory Visit (HOSPITAL_COMMUNITY)
Admission: RE | Admit: 2020-10-16 | Discharge: 2020-10-16 | Disposition: A | Discharge status: Home / Self Care | Payer: Medicare Other | Attending: Cardiology | Admitting: Cardiology

## 2020-10-16 ENCOUNTER — Other Ambulatory Visit: Payer: Self-pay

## 2020-10-16 DIAGNOSIS — Z7952 Long term (current) use of systemic steroids: Secondary | ICD-10-CM | POA: Diagnosis not present

## 2020-10-16 DIAGNOSIS — I5022 Chronic systolic (congestive) heart failure: Secondary | ICD-10-CM | POA: Diagnosis not present

## 2020-10-16 DIAGNOSIS — Z8249 Family history of ischemic heart disease and other diseases of the circulatory system: Secondary | ICD-10-CM | POA: Insufficient documentation

## 2020-10-16 DIAGNOSIS — Z794 Long term (current) use of insulin: Secondary | ICD-10-CM | POA: Insufficient documentation

## 2020-10-16 DIAGNOSIS — I4891 Unspecified atrial fibrillation: Secondary | ICD-10-CM | POA: Diagnosis not present

## 2020-10-16 DIAGNOSIS — Z940 Kidney transplant status: Secondary | ICD-10-CM | POA: Insufficient documentation

## 2020-10-16 DIAGNOSIS — Z8616 Personal history of COVID-19: Secondary | ICD-10-CM | POA: Diagnosis not present

## 2020-10-16 DIAGNOSIS — N183 Chronic kidney disease, stage 3 unspecified: Secondary | ICD-10-CM | POA: Insufficient documentation

## 2020-10-16 DIAGNOSIS — I130 Hypertensive heart and chronic kidney disease with heart failure and stage 1 through stage 4 chronic kidney disease, or unspecified chronic kidney disease: Secondary | ICD-10-CM | POA: Diagnosis not present

## 2020-10-16 DIAGNOSIS — I25119 Atherosclerotic heart disease of native coronary artery with unspecified angina pectoris: Secondary | ICD-10-CM | POA: Diagnosis not present

## 2020-10-16 DIAGNOSIS — E1122 Type 2 diabetes mellitus with diabetic chronic kidney disease: Secondary | ICD-10-CM | POA: Diagnosis not present

## 2020-10-16 DIAGNOSIS — Z7901 Long term (current) use of anticoagulants: Secondary | ICD-10-CM | POA: Diagnosis not present

## 2020-10-16 DIAGNOSIS — Z79899 Other long term (current) drug therapy: Secondary | ICD-10-CM | POA: Insufficient documentation

## 2020-10-16 DIAGNOSIS — Z8673 Personal history of transient ischemic attack (TIA), and cerebral infarction without residual deficits: Secondary | ICD-10-CM | POA: Insufficient documentation

## 2020-10-16 DIAGNOSIS — E785 Hyperlipidemia, unspecified: Secondary | ICD-10-CM | POA: Insufficient documentation

## 2020-10-16 DIAGNOSIS — I4819 Other persistent atrial fibrillation: Secondary | ICD-10-CM | POA: Insufficient documentation

## 2020-10-16 DIAGNOSIS — I255 Ischemic cardiomyopathy: Secondary | ICD-10-CM | POA: Diagnosis not present

## 2020-10-16 HISTORY — PX: CARDIOVERSION: SHX1299

## 2020-10-16 SURGERY — CARDIOVERSION
Anesthesia: General

## 2020-10-16 MED ORDER — PROPOFOL 10 MG/ML IV BOLUS
INTRAVENOUS | Status: DC | PRN
  Administered 2020-10-16: 70 mg via INTRAVENOUS

## 2020-10-16 MED ORDER — LIDOCAINE 2% (20 MG/ML) 5 ML SYRINGE
INTRAMUSCULAR | Status: DC | PRN
  Administered 2020-10-16: 30 mg via INTRAVENOUS

## 2020-10-16 MED ORDER — SODIUM CHLORIDE 0.9 % IV SOLN: INTRAVENOUS | Status: DC | PRN

## 2020-10-16 NOTE — Anesthesia Preprocedure Evaluation (Addendum)
Anesthesia Evaluation  Patient identified by MRN, date of birth, ID band Patient awake    Reviewed: Allergy & Precautions, NPO status , Patient's Chart, lab work & pertinent test results  History of Anesthesia Complications Negative for: history of anesthetic complications  Airway Mallampati: III  TM Distance: >3 FB Neck ROM: Full    Dental  (+) Upper Dentures, Lower Dentures   Pulmonary Current Smoker and Patient abstained from smoking.,    Pulmonary exam normal        Cardiovascular hypertension, Pt. on home beta blockers and Pt. on medications + Past MI  Normal cardiovascular exam+ dysrhythmias Atrial Fibrillation    '22 Cath - .  Prox RCA to Mid RCA lesion is 100% stenosed. .  1st Mrg lesion is 80% stenosed. .  2nd Mrg-1 lesion is 70% stenosed. .  2nd Mrg-2 lesion is 80% stenosed. .  1st Diag lesion is 80% stenosed. .  Dist LM lesion is 30% stenosed. Colon Flattery Cx lesion is 80% stenosed. .  2nd Diag lesion is 80% stenosed. .  Prox LAD lesion is 75% stenosed. .  Mid LAD lesion is 99% stenosed.  '22 TTE - EF 25 to 30%. Global hypokinesis. The left ventricular internal cavity size was moderately dilated. Right ventricular systolic function is mildly reduced. The right ventricular size is mildly enlarged. Mild mitral valve regurgitation. Tricuspid valve regurgitation is mild.     Neuro/Psych negative neurological ROS  negative psych ROS   GI/Hepatic negative GI ROS, Neg liver ROS,   Endo/Other  diabetes, Type 2, Insulin Dependent K 5.4   Renal/GU CRFRenal disease     Musculoskeletal negative musculoskeletal ROS (+)   Abdominal   Peds  Hematology  Plt 123k On eliquis    Anesthesia Other Findings   Reproductive/Obstetrics                            Anesthesia Physical Anesthesia Plan  ASA: 3  Anesthesia Plan: General   Post-op Pain Management:    Induction: Intravenous  PONV  Risk Score and Plan: 1 and Treatment may vary due to age or medical condition and Propofol infusion  Airway Management Planned: Mask and Natural Airway  Additional Equipment: None  Intra-op Plan:   Post-operative Plan:   Informed Consent: I have reviewed the patients History and Physical, chart, labs and discussed the procedure including the risks, benefits and alternatives for the proposed anesthesia with the patient or authorized representative who has indicated his/her understanding and acceptance.       Plan Discussed with: CRNA and Anesthesiologist  Anesthesia Plan Comments:        Anesthesia Quick Evaluation

## 2020-10-16 NOTE — Discharge Instructions (Signed)
Electrical Cardioversion  Electrical cardioversion is the delivery of a jolt of electricity to restore a normal rhythm to the heart. A rhythm that is too fast or is not regular keeps the heart from pumping well. In this procedure, sticky patches or metal paddles are placed on the chest to deliver electricity to the heart from a device.  What can I expect after the procedure?  Your blood pressure, heart rate, breathing rate, and blood oxygen level will be monitored until you leave the hospital or clinic.  Your heart rhythm will be watched to make sure it does not change.  You may have some redness on the skin where the shocks were given.If this occurs, can use hydrocortisone cream or Aloe vera.  Follow these instructions at home:  Do not drive for 24 hours if you were given a sedative during your procedure.  Take over-the-counter and prescription medicines only as told by your health care provider.  Ask your health care provider how to check your pulse. Check it often.  Rest for 48 hours after the procedure or as told by your health care provider.  Avoid or limit your caffeine use as told by your health care provider.  Keep all follow-up visits as told by your health care provider. This is important.  Contact a health care provider if:  You feel like your heart is beating too quickly or your pulse is not regular.  You have a serious muscle cramp that does not go away.  Get help right away if:  You have discomfort in your chest.  You are dizzy or you feel faint.  You have trouble breathing or you are short of breath.  Your speech is slurred.  You have trouble moving an arm or leg on one side of your body.  Your fingers or toes turn cold or blue.  Summary  Electrical cardioversion is the delivery of a jolt of electricity to restore a normal rhythm to the heart.  This procedure may be done right away in an emergency or may be a scheduled procedure if the condition is not  an emergency.  Generally, this is a safe procedure.  After the procedure, check your pulse often as told by your health care provider.  This information is not intended to replace advice given to you by your health care provider. Make sure you discuss any questions you have with your health care provider. Document Revised: 08/28/2018 Document Reviewed: 08/28/2018 Elsevier Patient Education  2021 Elsevier Inc[employer]  

## 2020-10-16 NOTE — Anesthesia Postprocedure Evaluation (Signed)
Anesthesia Post Note  Patient: Daniel Myers  Procedure(s) Performed: CARDIOVERSION     Patient location during evaluation: PACU Anesthesia Type: General Level of consciousness: awake and alert Pain management: pain level controlled Vital Signs Assessment: post-procedure vital signs reviewed and stable Respiratory status: spontaneous breathing, nonlabored ventilation and respiratory function stable Cardiovascular status: stable and blood pressure returned to baseline Anesthetic complications: no   No notable events documented.  Last Vitals:  Vitals:   10/16/20 1155 10/16/20 1200  BP: (!) 117/52 (!) 112/47  Pulse: (!) 41 (!) 44  Resp: 15 16  Temp:    SpO2: 99% 99%    Last Pain:  Vitals:   10/16/20 1151  TempSrc:   PainSc: 0-No pain                 Audry Pili

## 2020-10-16 NOTE — Procedures (Signed)
Electrical Cardioversion Procedure Note Daniel Myers [id number] [birth year]  Procedure: Electrical Cardioversion Indications:  Atrial Fibrillation  Procedure Details Consent: Risks of procedure as well as the alternatives and risks of each were explained to the (patient/caregiver).  Consent for procedure obtained. Time Out: Verified patient identification, verified procedure, site/side was marked, verified correct patient position, special equipment/implants available, medications/allergies/relevent history reviewed, required imaging and test results available.  Performed  Patient placed on cardiac monitor, pulse oximetry, supplemental oxygen as necessary.  Sedation given:  Propofol per anesthesiology Pacer pads placed anterior and posterior chest.  Cardioverted 1 time(s).  Cardioverted at [place].  Evaluation Findings: Post procedure EKG shows: NSR Complications: None Patient did tolerate procedure well.   Loralie Champagne 10/16/2020, 11:38 AM

## 2020-10-16 NOTE — Interval H&P Note (Signed)
History and Physical Interval Note:  10/16/2020 11:33 AM  Daniel Myers  has presented today for surgery, with the diagnosis of AFIB.  The various methods of treatment have been discussed with the patient and family. After consideration of risks, benefits and other options for treatment, the patient has consented to  Procedure(s): CARDIOVERSION (N/A) as a surgical intervention.  The patient's history has been reviewed, patient examined, no change in status, stable for surgery.  I have reviewed the patient's chart and labs.  Questions were answered to the patient's satisfaction.     [name] [employer]

## 2020-10-16 NOTE — Transfer of Care (Signed)
Immediate Anesthesia Transfer of Care Note  Patient: Daniel Myers  Procedure(s) Performed: CARDIOVERSION  Patient Location: Endoscopy Unit  Anesthesia Type:General  Level of Consciousness: drowsy and patient cooperative  Airway & Oxygen Therapy: Patient Spontanous Breathing and Patient connected to nasal cannula oxygen  Post-op Assessment: Report given to RN and Post -op Vital signs reviewed and stable  Post vital signs: Reviewed and stable  Last Vitals:  Vitals Value Taken Time  BP    Temp    Pulse    Resp    SpO2      Last Pain:  Vitals:   10/16/20 1026  TempSrc: Temporal  PainSc: 0-No pain         Complications: No notable events documented.

## 2020-10-17 ENCOUNTER — Telehealth (HOSPITAL_COMMUNITY): Payer: Self-pay | Admitting: *Deleted

## 2020-10-17 ENCOUNTER — Encounter (HOSPITAL_COMMUNITY): Payer: Self-pay | Admitting: Cardiology

## 2020-10-17 NOTE — Telephone Encounter (Signed)
Pts wife left vm stating pt had DCCV yesterday and now his heart rate is in the 40's. She asked if that is ok.    Routed to [place] for advice

## 2020-10-17 NOTE — Telephone Encounter (Signed)
As long as BP is not low and he is not lightheaded, just decrease Toprol XL to 50 mg daily and let us know what HR is running on Monday.

## 2020-10-17 NOTE — Telephone Encounter (Signed)
Spoke with pts daughter he is not dizzy or light headed and bp is normal. Pt will decrease toprol xl to 50mg  daily and call Monday with heart rate.

## 2020-10-22 NOTE — Telephone Encounter (Signed)
Called pts daughter she said pt is doing well. Heart rate 40s-50s no complaints.

## 2020-10-29 ENCOUNTER — Encounter: Payer: Self-pay | Admitting: Family Medicine

## 2020-10-29 ENCOUNTER — Other Ambulatory Visit: Payer: Self-pay

## 2020-10-29 ENCOUNTER — Ambulatory Visit (INDEPENDENT_AMBULATORY_CARE_PROVIDER_SITE_OTHER): Payer: Medicare Other | Admitting: Family Medicine

## 2020-10-29 VITALS — BP 139/46 | HR 52 | Temp 97.6°F | Ht 70.0 in | Wt 193.2 lb

## 2020-10-29 DIAGNOSIS — E782 Mixed hyperlipidemia: Secondary | ICD-10-CM

## 2020-10-29 DIAGNOSIS — I5041 Acute combined systolic (congestive) and diastolic (congestive) heart failure: Secondary | ICD-10-CM

## 2020-10-29 DIAGNOSIS — E119 Type 2 diabetes mellitus without complications: Secondary | ICD-10-CM

## 2020-10-29 DIAGNOSIS — I10 Essential (primary) hypertension: Secondary | ICD-10-CM | POA: Diagnosis not present

## 2020-10-29 DIAGNOSIS — I2510 Atherosclerotic heart disease of native coronary artery without angina pectoris: Secondary | ICD-10-CM | POA: Diagnosis not present

## 2020-10-29 DIAGNOSIS — Z794 Long term (current) use of insulin: Secondary | ICD-10-CM

## 2020-10-29 NOTE — Progress Notes (Signed)
Subjective:  Patient ID: Daniel Myers, male    DOB: Dec 23, 1945  Age: 75 y.o. MRN: [id number]  CC: Medical Management of Chronic Issues   HPI Daniel Myers presents forFollow-up of diabetes. Patient checks blood sugar at home.   125 fasting and 250 postprandial. Increased AM regular insulin  to 18 units Patient denies symptoms such as polyuria, polydipsia, excessive hunger, nausea No significant hypoglycemic spells noted. Medications reviewed. Pt reports taking them regularly without complication/adverse reaction being reported today.  Pt. Has been offered CABG, but declined as it is a high risk procedure. Currently feeling well. Still has low ejection fraction. Had cardioversion that has been successful so far.   History Daniel Myers has a past medical history of Acute respiratory failure with hypoxia ([place]) (02/12/2020), AKI (acute kidney injury) ([place]) (02/13/2020), Atrial fibrillation with RVR ([place]) (03/11/2020), Basal cell carcinoma (06/27/2013), Basal cell carcinoma (07/01/2014), Basal cell carcinoma (10/10/2014), Basal cell carcinoma (02/11/2015), Basal cell carcinoma (06/14/2016), Basal cell carcinoma (02/22/2017), Basal cell carcinoma (04/11/2018), Chronic kidney disease, History of renal transplant, Hyperlipidemia, Hypertension, NSTEMI (non-ST elevated myocardial infarction) ([place]) (02/13/2020), 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), 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), 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), Squamous cell carcinoma of skin (11/08/2011), Squamous cell carcinoma of skin (11/08/2011), Squamous cell carcinoma of skin (11/08/2011), 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), 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), 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), 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), 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), 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), 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), Squamous cell  carcinoma of skin (07/01/2014), Squamous cell carcinoma of skin (09/30/2015), Squamous cell carcinoma of skin (02/22/2017), Squamous cell carcinoma of skin (02/22/2017), Squamous cell carcinoma of skin (04/11/2018), Squamous cell carcinoma of skin (04/11/2018), Squamous cell carcinoma of skin (04/11/2018), Squamous cell carcinoma of skin (04/11/2018), Squamous cell carcinoma of skin (04/11/2018), Squamous cell carcinoma of skin (09/28/2018), Squamous cell carcinoma of skin (09/28/2018), Squamous cell carcinoma of skin (09/28/2018), Squamous cell carcinoma of skin (03/21/2019), Squamous cell carcinoma of skin (03/21/2019), Squamous cell carcinoma of skin (03/21/2019), Type 2 diabetes mellitus ([place]), and Unspecified atrial fibrillation ([place]) (02/17/2020).   He has a past surgical history that includes Kidney transplant (Right); RIGHT/LEFT HEART CATH AND CORONARY ANGIOGRAPHY (N/A, 09/09/2020); and Cardioversion (N/A, 10/16/2020).   His family history includes Clotting disorder in his mother; Diabetes in his sister; Hypertension in his sister.He reports that he has been smoking pipe. He has never used smokeless tobacco. He reports that he does not drink alcohol and does not use drugs.  Current Outpatient Medications on File Prior to Visit  Medication Sig Dispense Refill   acetaminophen (TYLENOL) 500 MG tablet Take 500 mg by mouth every 8 (eight) hours as needed for moderate pain.     albuterol (VENTOLIN HFA) 108 (90 Base) MCG/ACT inhaler Inhale 2 puffs into the lungs every 6 (six) hours as needed for wheezing or shortness of breath. 1 each 3   ALPRAZolam (XANAX) 0.25 MG tablet Take 0.5 mg by mouth at bedtime as needed for anxiety.     amiodarone (PACERONE) 200 MG tablet Take 1 tablet (200 mg total) by mouth daily. 30 tablet 11   apixaban (ELIQUIS) 5 MG TABS tablet Take 1 tablet (5 mg total) by mouth 2 (two) times daily. 60 tablet 1   ascorbic acid (VITAMIN C) 500 MG tablet Take 1 tablet (500 mg total) by mouth  daily.     atorvastatin (LIPITOR) 10 MG tablet Take 1  tablet (10 mg total) by mouth daily. 90 tablet 1   blood glucose meter kit and supplies Dispense based on patient and insurance preference. Use up to four times daily as directed. (FOR ICD-10 E10.9, E11.9). 1 each 0   ezetimibe (ZETIA) 10 MG tablet Take 1 tablet (10 mg total) by mouth daily. 30 tablet 11   fluticasone (FLONASE) 50 MCG/ACT nasal spray USE ONE SPRAY(S) IN EACH NOSTRIL ONCE DAILY (Patient taking differently: Place 1 spray into both nostrils daily as needed for allergies.) 16 g 11   furosemide (LASIX) 40 MG tablet Take 1 tablet (40 mg total) by mouth daily. 90 tablet 3   insulin NPH Human (NOVOLIN N) 100 UNIT/ML injection 10 units AC breakfast and 10 units AC supper (Patient taking differently: Inject 10 Units into the skin 2 (two) times daily before a meal. 10 units AC breakfast and 10 units AC supper) 30 mL 11   Insulin Regular Human 100 UNIT/ML SOPN Inject 16 units before breakfast and 10 before supper (Patient taking differently: Inject 10-16 Units into the skin See admin instructions. Inject 16 units before breakfast and 10 before supper) 10 mL prn   INVOKANA 300 MG TABS tablet TAKE 1 TABLET BY MOUTH ONCE DAILY BEFORE BREAKFAST 30 tablet 1   loratadine (CLARITIN) 10 MG tablet Take 10 mg by mouth daily as needed for allergies.     metoprolol succinate (TOPROL XL) 50 MG 24 hr tablet Take 1.5 tablets (75 mg total) by mouth daily. Take with or immediately following a meal. 45 tablet 11   mycophenolate (CELLCEPT) 250 MG capsule Take 500 mg by mouth 2 (two) times daily.     ondansetron (ZOFRAN) 4 MG tablet Take 1 tablet (4 mg total) by mouth every 8 (eight) hours as needed for nausea or vomiting. 20 tablet 0   predniSONE (DELTASONE) 5 MG tablet Take 5 mg by mouth daily with breakfast.     RELION INSULIN SYRINGE 31G X 15/64" 0.5 ML MISC USE AS DIRECTED FOR INSULIN     sacubitril-valsartan (ENTRESTO) 24-26 MG Take 1 tablet by mouth 2  (two) times daily. 180 tablet 3   sodium bicarbonate 650 MG tablet Take 1,300 mg by mouth 2 (two) times daily.     spironolactone (ALDACTONE) 25 MG tablet Take 0.5 tablets (12.5 mg total) by mouth daily.     tacrolimus (PROGRAF) 0.5 MG capsule Take 1 mg by mouth in the morning and at bedtime.     zinc sulfate 220 (50 Zn) MG capsule Take 1 capsule (220 mg total) by mouth daily.     No current facility-administered medications on file prior to visit.    ROS Review of Systems  Constitutional:  Negative for fever.  Respiratory:  Negative for shortness of breath.   Cardiovascular:  Negative for chest pain.  Musculoskeletal:  Negative for arthralgias.  Skin:  Negative for rash.   Objective:  BP (!) 139/46   Pulse (!) 52   Temp 97.6 F (36.4 C)   Ht '5\' 10"'  (1.778 m)   Wt 193 lb 3.2 oz (87.6 kg)   SpO2 100%   BMI 27.72 kg/m   BP Readings from Last 3 Encounters:  10/29/20 (!) 139/46  10/16/20 (!) 112/47  10/03/20 140/80    Wt Readings from Last 3 Encounters:  10/29/20 193 lb 3.2 oz (87.6 kg)  10/16/20 191 lb (86.6 kg)  10/03/20 189 lb 9.6 oz (86 kg)     Physical Exam Vitals reviewed.  Constitutional:  Appearance: He is well-developed.  HENT:     Head: Normocephalic and atraumatic.     Right Ear: External ear normal.     Left Ear: External ear normal.     Mouth/Throat:     Pharynx: No oropharyngeal exudate or posterior oropharyngeal erythema.  Eyes:     Pupils: Pupils are equal, round, and reactive to light.  Cardiovascular:     Rate and Rhythm: Normal rate and regular rhythm.     Heart sounds: No murmur heard. Pulmonary:     Effort: No respiratory distress.     Breath sounds: Normal breath sounds.  Musculoskeletal:     Cervical back: Normal range of motion and neck supple.  Neurological:     Mental Status: He is alert and oriented to person, place, and time.      Assessment & Plan:   Daniel Myers was seen today for medical management of chronic  issues.  Diagnoses and all orders for this visit:  Systolic and diastolic CHF, acute ([place]) -     CBC with Differential/Platelet -     CMP14+EGFR  Insulin dependent type 2 diabetes mellitus ([place]) -     CBC with Differential/Platelet -     CMP14+EGFR -     Bayer DCA Hb A1c Waived -     Urinalysis  Essential hypertension -     CBC with Differential/Platelet -     CMP14+EGFR -     Urinalysis  Mixed hyperlipidemia -     CBC with Differential/Platelet -     CMP14+EGFR -     Lipid panel     I am having Daniel Myers maintain his tacrolimus, sodium bicarbonate, mycophenolate, ReliOn Insulin Syringe, loratadine, blood glucose meter kit and supplies, zinc sulfate, ascorbic acid, albuterol, predniSONE, ondansetron, ALPRAZolam, atorvastatin, fluticasone, spironolactone, insulin NPH Human, Insulin Regular Human, ezetimibe, amiodarone, metoprolol succinate, acetaminophen, Invokana, apixaban, furosemide, and Entresto.  No orders of the defined types were placed in this encounter.    Follow-up: Return in about 3 months (around 01/28/2021).  Claretta Fraise, M.D.

## 2020-11-05 ENCOUNTER — Encounter (HOSPITAL_COMMUNITY): Payer: Self-pay | Admitting: Cardiology

## 2020-11-05 ENCOUNTER — Other Ambulatory Visit (HOSPITAL_COMMUNITY): Payer: Self-pay

## 2020-11-05 ENCOUNTER — Ambulatory Visit (HOSPITAL_COMMUNITY)
Admission: RE | Admit: 2020-11-05 | Discharge: 2020-11-05 | Disposition: A | Discharge status: Home / Self Care | Payer: Medicare Other | Source: Ambulatory Visit | Attending: Cardiology | Admitting: Cardiology

## 2020-11-05 ENCOUNTER — Other Ambulatory Visit: Payer: Self-pay

## 2020-11-05 ENCOUNTER — Encounter: Payer: Self-pay | Admitting: Family Medicine

## 2020-11-05 VITALS — BP 136/60 | HR 43 | Wt 193.2 lb

## 2020-11-05 DIAGNOSIS — Z940 Kidney transplant status: Secondary | ICD-10-CM | POA: Insufficient documentation

## 2020-11-05 DIAGNOSIS — I252 Old myocardial infarction: Secondary | ICD-10-CM | POA: Insufficient documentation

## 2020-11-05 DIAGNOSIS — I5042 Chronic combined systolic (congestive) and diastolic (congestive) heart failure: Secondary | ICD-10-CM

## 2020-11-05 DIAGNOSIS — I5041 Acute combined systolic (congestive) and diastolic (congestive) heart failure: Secondary | ICD-10-CM

## 2020-11-05 DIAGNOSIS — Z7952 Long term (current) use of systemic steroids: Secondary | ICD-10-CM | POA: Insufficient documentation

## 2020-11-05 DIAGNOSIS — Z79899 Other long term (current) drug therapy: Secondary | ICD-10-CM | POA: Diagnosis not present

## 2020-11-05 DIAGNOSIS — Z8616 Personal history of COVID-19: Secondary | ICD-10-CM | POA: Diagnosis not present

## 2020-11-05 DIAGNOSIS — F17290 Nicotine dependence, other tobacco product, uncomplicated: Secondary | ICD-10-CM | POA: Diagnosis not present

## 2020-11-05 DIAGNOSIS — Z794 Long term (current) use of insulin: Secondary | ICD-10-CM | POA: Insufficient documentation

## 2020-11-05 DIAGNOSIS — I4819 Other persistent atrial fibrillation: Secondary | ICD-10-CM | POA: Diagnosis not present

## 2020-11-05 DIAGNOSIS — E119 Type 2 diabetes mellitus without complications: Secondary | ICD-10-CM

## 2020-11-05 DIAGNOSIS — E785 Hyperlipidemia, unspecified: Secondary | ICD-10-CM | POA: Insufficient documentation

## 2020-11-05 DIAGNOSIS — Z8249 Family history of ischemic heart disease and other diseases of the circulatory system: Secondary | ICD-10-CM | POA: Diagnosis not present

## 2020-11-05 DIAGNOSIS — N183 Chronic kidney disease, stage 3 unspecified: Secondary | ICD-10-CM | POA: Diagnosis not present

## 2020-11-05 DIAGNOSIS — R7989 Other specified abnormal findings of blood chemistry: Secondary | ICD-10-CM | POA: Insufficient documentation

## 2020-11-05 DIAGNOSIS — Z8673 Personal history of transient ischemic attack (TIA), and cerebral infarction without residual deficits: Secondary | ICD-10-CM | POA: Diagnosis not present

## 2020-11-05 DIAGNOSIS — Z7901 Long term (current) use of anticoagulants: Secondary | ICD-10-CM | POA: Diagnosis not present

## 2020-11-05 DIAGNOSIS — I255 Ischemic cardiomyopathy: Secondary | ICD-10-CM | POA: Diagnosis not present

## 2020-11-05 DIAGNOSIS — E875 Hyperkalemia: Secondary | ICD-10-CM | POA: Diagnosis not present

## 2020-11-05 DIAGNOSIS — I2510 Atherosclerotic heart disease of native coronary artery without angina pectoris: Secondary | ICD-10-CM | POA: Insufficient documentation

## 2020-11-05 DIAGNOSIS — I130 Hypertensive heart and chronic kidney disease with heart failure and stage 1 through stage 4 chronic kidney disease, or unspecified chronic kidney disease: Secondary | ICD-10-CM | POA: Diagnosis not present

## 2020-11-05 DIAGNOSIS — I5022 Chronic systolic (congestive) heart failure: Secondary | ICD-10-CM | POA: Diagnosis not present

## 2020-11-05 LAB — COMPREHENSIVE METABOLIC PANEL
ALT: 19 U/L (ref 0–44)
AST: 20 U/L (ref 15–41)
Albumin: 4 g/dL (ref 3.5–5.0)
Alkaline Phosphatase: 55 U/L (ref 38–126)
Anion gap: 9 (ref 5–15)
BUN: 49 mg/dL — ABNORMAL HIGH (ref 8–23)
CO2: 26 mmol/L (ref 22–32)
Calcium: 9 mg/dL (ref 8.9–10.3)
Chloride: 98 mmol/L (ref 98–111)
Creatinine, Ser: 2.11 mg/dL — ABNORMAL HIGH (ref 0.61–1.24)
GFR, Estimated: 32 mL/min — ABNORMAL LOW (ref >60)
Glucose, Bld: 143 mg/dL — ABNORMAL HIGH (ref 70–99)
Potassium: 4.7 mmol/L (ref 3.5–5.1)
Sodium: 133 mmol/L — ABNORMAL LOW (ref 135–145)
Total Bilirubin: 1.3 mg/dL — ABNORMAL HIGH (ref 0.3–1.2)
Total Protein: 6.5 g/dL (ref 6.5–8.1)

## 2020-11-05 LAB — HEMOGLOBIN A1C
Hgb A1c MFr Bld: 7.2 % — ABNORMAL HIGH (ref 4.8–5.6)
Mean Plasma Glucose: 159.94 mg/dL

## 2020-11-05 LAB — TSH: TSH: 3.867 u[IU]/mL (ref 0.350–4.500)

## 2020-11-05 MED ORDER — AMIODARONE HCL 200 MG PO TABS
100.0000 mg | ORAL_TABLET | Freq: Every day | ORAL | 11 refills | Status: DC
Start: 2020-11-05 — End: 2021-09-21

## 2020-11-05 MED ORDER — NITROGLYCERIN 0.4 MG SL SUBL: 0.4000 mg | SUBLINGUAL_TABLET | SUBLINGUAL | 3 refills | Status: AC | PRN

## 2020-11-05 MED ORDER — VERQUVO 2.5 MG PO TABS: 2.5000 | ORAL_TABLET | Freq: Every day | ORAL | 6 refills | Status: DC

## 2020-11-05 NOTE — Progress Notes (Signed)
Briefly ambulated pt in hallway, HR quickly rose to 58-60 and stayed in that range. Dr Aundra Dubin aware

## 2020-11-05 NOTE — Patient Instructions (Signed)
Decrease Amiodarone to 100 mg (1/2 tab) Daily  Start Verquvo 2.5 mg Daily  Labs done today, your results will be available in MyChart, we will contact you for abnormal readings.  Please follow up with our heart failure pharmacist in 3 weeks  Your physician recommends that you schedule a follow-up appointment in: 6 weeks  If you have any questions or concerns before your next appointment please send [place] a message through [place] or call our office at [phone].    TO LEAVE A MESSAGE FOR THE NURSE SELECT OPTION 2, PLEASE LEAVE A MESSAGE INCLUDING: YOUR NAME DATE OF BIRTH CALL BACK NUMBER REASON FOR CALL**this is important as we prioritize the call backs  YOU WILL RECEIVE A CALL BACK THE SAME DAY AS LONG AS YOU CALL BEFORE 4:00 PM  At the [facility], you and your health needs are our priority. As part of our continuing mission to provide you with exceptional heart care, we have created designated Provider Care Teams. These Care Teams include your primary Cardiologist (physician) and Advanced Practice Providers (APPs- Physician Assistants and Nurse Practitioners) who all work together to provide you with the care you need, when you need it.   You may see any of the following providers on your designated Care Team at your next follow up: Dr Glori Bickers Dr Loralie Champagne Dr Patrice Paradise, NP Lyda Jester, [place] Ginnie Smart Audry Riles, PharmD   Please be sure to bring in all your medications bottles to every appointment.

## 2020-11-05 NOTE — Progress Notes (Signed)
PCP: Claretta Fraise, MD Cardiology: Dr. Domenic Polite HF Cardiology: Dr. Aundra Dubin  75 y.o. with history of suspected CAD/NSTEMI, CKD stage 3 s/p renal transplant, chronic systolic CHF, and persistent atrial fibrillation was referred by Dr. Domenic Polite for evaluation of CHF, atrial fibrillation, and suspected CAD.  Patient was doing fairly well until 1/22.  He developed COVID-19 PNA.  Hospitalization was complicated by NSTEMI and the onset of atrial fibrillation.  Cath was not done with elevated creatinine.  Echo in 1/22 showed EF 40-45% with wall motion abnormalities.  Cardiolite done as an outpatient in 3/22 showed EF 21%, inferior and inferolateral fixed defect.    Patient had CVA in 2/22, likely related to atrial fibrillation.  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  He did PT.   Repeat echo in 4/22 showed EF down further to 25-30%.  RHC/LHC was done in 8/22.  This showed severe 3VD with occluded RCA with collaterals, 99% proximal-mid LAD stenosis, 80% ostial LCx, significant disease in OMs and diagonals.  RHC showed normal filling pressures and preserved cardiac output.   He saw Dr. Cyndia Bent and decided that he did not want to undergo CABG.  I reviewed the cath films with Dr. Burt Knack. Could potentially intervene on LAD, but would essentially be a CTO procedure with increased risk to the patient (including worsening renal function).  Based on recently released REVIVED-BCIS trial, I do not think that Daniel Myers would derive much benefit from PCI in this situation in the absence of significant angina (which he does not seem to have.   In 9/22, he had DCCV back to NSR.  HR in 40s at rest.   Patient returns for followup of CHF, CAD, and atrial fibrillation.  He remains in NSR today.  He is short of breath only with heavy exertion.   No orthopnea/PND.  No lightheadedness/syncope.  He has not had chest pain since last appointment.  HR in 40s at rest, but with ambulation, HR rose to the 60s.    ECG  (personally reviewed): NSR at 45 bpm, old inferior MI, old anterolateral MI.   Labs (6/22): LDL 92, HDL 40, K 5.1, creatinine 1.7 Labs (8/22): K 5.4, creatinine 2.16, LFTs normal, TSH normal, plts 123, hgb 15.2  PMH: 1. Type 2 diabetes 2. HTN 3. Hyperlipidemia 4. H/o COVID-19 PNA in 1/22.  5. CKD: Stage 3.  History of renal transplant.   6. 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  7. Thrombocytopenia 8. Skin cancer 9. OSA 10. CAD: NSTEMI in 1/22 during admission for COVID PNA.  HS-TnI to 8647.  Not cathed with elevated creatinine.  - Cardiolite (3/22): EF 21%, inferior and inferolateral fixed defect, apical fixed defect.  - LHC (8/22): Severe 3VD with occluded RCA with collaterals, 99% proximal-mid LAD stenosis, 80% ostial LCx, significant disease in OMs and diagonals. 11. Chronic systolic CHF: Ischemic cardiomyopathy.  - Echo (1/22): EF 40-45%, wall motion abnormalities.  - Echo (4/22): EF 25-30%, global hypokinesis.  - RHC/LHC (8/22): severe 3VD with occluded RCA with collaterals, 99% proximal-mid LAD stenosis, 80% ostial LCx, significant disease in OMs and diagonals. Mean RA 2, PA 21/7, mean PCWP 11, CI 2.2 Fick/2.33 thermodilution 12. Atrial fibrillation: Persistent since 1/22. DCCV to NSR in 9/22.   Social History   Socioeconomic History   Marital status: Married    Spouse name: Not on file   Number of children: Not on file   Years of education: Not on file  Highest education level: Not on file  Occupational History   Not on file  Tobacco Use   Smoking status: Some Days    Types: Pipe   Smokeless tobacco: Never  Vaping Use   Vaping Use: Never used  Substance and Sexual Activity   Alcohol use: Never   Drug use: Never   Sexual activity: Not on file  Other Topics Concern   Not on file  Social History Narrative   Not on file   Social Determinants of Health   Financial Resource Strain: Low Risk    Difficulty of Paying Living Expenses: Not  hard at all  Food Insecurity: No Food Insecurity   Worried About Charity fundraiser in the Last Year: Never true   [place] in the Last Year: Never true  Transportation Needs: No Transportation Needs   Lack of Transportation (Medical): No   Lack of Transportation (Non-Medical): No  Physical Activity: Inactive   Days of Exercise per Week: 0 days   Minutes of Exercise per Session: 0 min  Stress: Not on file  Social Connections: Socially Isolated   Frequency of Communication with Friends and Family: More than three times a week   Frequency of Social Gatherings with Friends and Family: More than three times a week   Attends Religious Services: Never   Marine scientist or Organizations: No   Attends Archivist Meetings: Never   Marital Status: Widowed  Human resources officer Violence: Not on file   Family History  Problem Relation Age of Onset   Clotting disorder Mother    Hypertension Sister    Diabetes Sister    ROS: All systems reviewed and negative except as per HPI.   Current Outpatient Medications  Medication Sig Dispense Refill   acetaminophen (TYLENOL) 500 MG tablet Take 500 mg by mouth every 8 (eight) hours as needed for moderate pain.     albuterol (VENTOLIN HFA) 108 (90 Base) MCG/ACT inhaler Inhale 2 puffs into the lungs every 6 (six) hours as needed for wheezing or shortness of breath. 1 each 3   ALPRAZolam (XANAX) 0.25 MG tablet Take 0.5 mg by mouth at bedtime as needed for anxiety.     apixaban (ELIQUIS) 5 MG TABS tablet Take 1 tablet (5 mg total) by mouth 2 (two) times daily. 60 tablet 1   ascorbic acid (VITAMIN C) 500 MG tablet Take 1 tablet (500 mg total) by mouth daily.     atorvastatin (LIPITOR) 10 MG tablet Take 1 tablet (10 mg total) by mouth daily. 90 tablet 1   blood glucose meter kit and supplies Dispense based on patient and insurance preference. Use up to four times daily as directed. (FOR ICD-10 E10.9, E11.9). 1 each 0   ezetimibe (ZETIA)  10 MG tablet Take 1 tablet (10 mg total) by mouth daily. 30 tablet 11   fluticasone (FLONASE) 50 MCG/ACT nasal spray USE ONE SPRAY(S) IN EACH NOSTRIL ONCE DAILY 16 g 11   furosemide (LASIX) 40 MG tablet Take 1 tablet (40 mg total) by mouth daily. 90 tablet 3   insulin NPH Human (NOVOLIN N) 100 UNIT/ML injection 10 units AC breakfast and 10 units AC supper 30 mL 11   insulin regular (NOVOLIN R) 100 units/mL injection Inject 18 Units into the skin 2 (two) times daily. 18 units w bkfst and 8 units w dinner     INVOKANA 300 MG TABS tablet TAKE 1 TABLET BY MOUTH ONCE DAILY BEFORE BREAKFAST 30 tablet  1   loratadine (CLARITIN) 10 MG tablet Take 10 mg by mouth daily as needed for allergies.     metoprolol succinate (TOPROL-XL) 50 MG 24 hr tablet Take 50 mg by mouth daily. Take with or immediately following a meal.     mycophenolate (CELLCEPT) 250 MG capsule Take 500 mg by mouth 2 (two) times daily.     nitroGLYCERIN (NITROSTAT) 0.4 MG SL tablet Place 1 tablet (0.4 mg total) under the tongue every 5 (five) minutes as needed for chest pain. 25 tablet 3   ondansetron (ZOFRAN) 4 MG tablet Take 1 tablet (4 mg total) by mouth every 8 (eight) hours as needed for nausea or vomiting. 20 tablet 0   predniSONE (DELTASONE) 5 MG tablet Take 5 mg by mouth daily with breakfast.     RELION INSULIN SYRINGE 31G X 15/64" 0.5 ML MISC USE AS DIRECTED FOR INSULIN     sodium bicarbonate 650 MG tablet Take 1,300 mg by mouth 2 (two) times daily.     spironolactone (ALDACTONE) 25 MG tablet Take 0.5 tablets (12.5 mg total) by mouth daily.     tacrolimus (PROGRAF) 0.5 MG capsule Take 1 mg by mouth in the morning and at bedtime.     Vericiguat (VERQUVO) 2.5 MG TABS Take 2.5 tablets by mouth daily. 30 tablet 6   zinc sulfate 220 (50 Zn) MG capsule Take 1 capsule (220 mg total) by mouth daily.     amiodarone (PACERONE) 200 MG tablet Take 0.5 tablets (100 mg total) by mouth daily. 30 tablet 11   sacubitril-valsartan (ENTRESTO) 24-26 MG  Take 1 tablet by mouth 2 (two) times daily. (Patient not taking: Reported on 11/05/2020) 180 tablet 3   No current facility-administered medications for this encounter.   BP 136/60   Pulse (!) 43   Wt 87.6 kg (193 lb 3.2 oz)   SpO2 99%   BMI 27.72 kg/m  General: NAD Neck: No JVD, no thyromegaly or thyroid nodule.  Lungs: Clear to auscultation bilaterally with normal respiratory effort. CV: Nondisplaced PMI.  Heart regular S1/S2, no S3/S4, no murmur.  No peripheral edema.  No carotid bruit.  Normal pedal pulses.  Abdomen: Soft, nontender, no hepatosplenomegaly, no distention.  Skin: Intact without lesions or rashes.  Neurologic: Alert and oriented x 3.  Psych: Normal affect. Extremities: No clubbing or cyanosis.  HEENT: Normal.   Assessment/Plan: 1. Chronic systolic CHF: Echo in 2/83 with EF 25-30%.   Patient had NSTEMI during 1/22 admission with COVID-19, not cathed with CKD.  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 RHC in 8/22 with preserved cardiac output and low filling pressures.  He is not volume overloaded, NYHA class II-III symptoms.  Creatinine today 2.16.   - Continue Lasix 40 mg daily.    - Continue Toprol XL 50 mg daily, cannot increase due to bradycardia.  - Continue spironolactone 12.5 daily, cannot increase due to hyperkalemia.  - Continue canagliflozin.   - Will not start Entresto until creatinine is stabilized.  - Given limitations to GDMT due to elevated creatinine, I will start him on vericiguat 2.5 mg daily.  2. CAD: Cath in 8/22 with severe 3VD, occluded RCA with collaterals, 99% proximal-mid LAD stenosis, 80% ostial LCx, significant disease in OMs and diagonals.  With low EF and 3VD, I recommended CABG.  He saw Dr. Cyndia Bent, and is adamant that he does not want to have surgery.  Dr. Cyndia Bent wanted him to get an MRI for viability, but if he is not going  to have CABG, would not get MRI.  He has not had recent chest pain.  I reviewed his cath films with Dr. Burt Knack for  PCI options.  Could potentially intervene on LAD, but would essentially be a CTO procedure with increased risk to the patient (including worsening renal function).  Based on recent REVIVED-BCIS trial, I do not think that Daniel Myers would derive much benefit from PCI in this situation in the absence of significant angina (which he does not have currently).  - I will give him a prescription for SL NTG.  - Continue atorvastatin and Zetia.  - He is not on ASA due to Eliquis use.  3. Atrial fibrillation: DCCV to NSR in 9/22, he is in NSR today.  -  Decrease amiodarone to 100 mg daily.  Check LFTs and TSH today, he will need regular eye exam while on amiodarone.  -  Consider AF ablation eventually.  This may allow [place] to stop amiodarone.  4. 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  5. CKD: stage 3.  Post-transplant.   6. Hyperlipidemia: He is on atorvastatin 10 mg daily and Zetia, will need to check lipids next appointment.    Followup in 6 wks.   Loralie Champagne 11/05/2020

## 2020-11-05 NOTE — Progress Notes (Signed)
Medication Samples have been provided to the patient.  Drug name: Truett Mainland       Strength: 2.5mg         Qty: 2  LOT: [id number]  Exp.Date: 4/23  Dosing instructions: Take 1 tab Daily  The patient has been instructed regarding the correct time, dose, and frequency of taking this medication, including desired effects and most common side effects.   Nikkita Adeyemi 11:41 AM 11/05/2020

## 2020-11-10 ENCOUNTER — Other Ambulatory Visit: Payer: Self-pay | Admitting: Family Medicine

## 2020-11-11 ENCOUNTER — Other Ambulatory Visit (HOSPITAL_COMMUNITY): Payer: Medicare Other

## 2020-11-12 ENCOUNTER — Encounter (HOSPITAL_COMMUNITY): Payer: Self-pay

## 2020-11-12 ENCOUNTER — Other Ambulatory Visit (HOSPITAL_COMMUNITY): Payer: Self-pay | Admitting: *Deleted

## 2020-11-12 MED ORDER — APIXABAN 5 MG PO TABS: 5.0000 mg | ORAL_TABLET | Freq: Two times a day (BID) | ORAL | 3 refills | Status: DC

## 2020-11-13 ENCOUNTER — Telehealth (HOSPITAL_COMMUNITY): Payer: Self-pay | Admitting: Pharmacy Technician

## 2020-11-13 ENCOUNTER — Other Ambulatory Visit (HOSPITAL_COMMUNITY): Payer: Self-pay

## 2020-11-13 NOTE — Progress Notes (Signed)
PCP: Claretta Fraise, MD Cardiology: Dr. Domenic Polite HF Cardiology: Dr. Aundra Dubin  HPI:  75 y.o. with history of suspected CAD/NSTEMI, CKD stage 3 s/p renal transplant, chronic systolic CHF, and persistent atrial fibrillation was referred by Dr. Domenic Polite for evaluation of CHF, atrial fibrillation, and suspected CAD.  Patient was doing fairly well until 02/2020.  He developed COVID-19 PNA.  Hospitalization was complicated by NSTEMI and the onset of atrial fibrillation.  Cath was not done with elevated creatinine.  Echo in 02/2020 showed EF 40-45% with wall motion abnormalities.  Cardiolite done as an outpatient in 04/2020 showed EF 21%, inferior and inferolateral fixed defect.     Patient had CVA in 03/2020, likely related to atrial fibrillation.  He had right MCA infarct and was still weak on the left.  He did PT.    Repeat echo in 05/2020 showed EF down further to 25-30%.  RHC/LHC was done in 09/2020.  This showed severe 3VD with occluded RCA with collaterals, 99% proximal-mid LAD stenosis, 80% ostial LCx, significant disease in OMs and diagonals.  RHC showed normal filling pressures and preserved cardiac output.    He saw Dr. Cyndia Bent and decided that he did not want to undergo CABG.  Dr. Aundra Dubin reviewed the cath films with Dr. Burt Knack. Could potentially intervene on LAD, but would essentially be a CTO procedure with increased risk to the patient (including worsening renal function).  Based on recently released REVIVED-BCIS trial, it was thought that Mr. Fackrell would not derive much benefit from PCI in this situation in the absence of significant angina (which he does not seem to have).    In 10/2020, he had DCCV back to NSR.  HR in 40s at rest.    Patient recently returned to [facility] for followup of CHF, CAD, and atrial fibrillation.  He remained in NSR.  He was short of breath only with heavy exertion.   No orthopnea/PND.  No lightheadedness/syncope.  He had not had chest pain since last appointment.  HR in 40s at  rest, but with ambulation, HR rose to the 60s.   Today he returns to HF clinic for pharmacist medication titration. At last visit with MD, vericiguat 2.5 mg daily was initiated. Additionally, amiodarone was decreased to 100 mg daily. Overall he is feeling well today. No dizziness, lightheadedness, chest pain or palpitations. No SOB/DOE. Using cane for mobility issues after CVA. Weight at home has been stable at 189-192 lbs. Takes furosemide 40 mg daily and has not needed any extra. No LEE, PND or orthopnea. SBP at home have been 130-160, BP 142/64 in clinic today.     HF Medications: Metoprolol succinate 50 mg daily Spironolactone 12.5 mg daily Invokana 300 mg daily Vericiguat (Verquvo) 2.5 mg daily Furosemide 40 mg daily  Has the patient been experiencing any side effects to the medications prescribed?  no  Does the patient have any problems obtaining medications due to transportation or finances?   BCBS Medicare. Has PAN grant for [employer] (vericiguat). Currently in catastrophic coverage.   Understanding of regimen: good Understanding of indications: good Potential of compliance: good Patient understands to avoid NSAIDs. Patient understands to avoid decongestants.    Pertinent Lab Values: 11/05/20: Serum creatinine 2.11, BUN 49, Potassium 4.7, Sodium 133   Vital Signs: Weight: 193.4 lbs (last clinic weight: 193.2 lbs) Blood pressure: 142/64  Heart rate: 48   Assessment/Plan: 1. Chronic systolic CHF: Echo in 02/624 with EF 25-30%.   Patient had NSTEMI during 02/2020 admission with COVID-19, not  cathed with CKD.  Ischemic cardiomyopathy based on 09/2020 cath. [place] in 09/2020 with preserved cardiac output and low filling pressures.   - He is not volume overloaded, NYHA class II-III symptoms.   - Continue furosemide 40 mg daily.    - Continue metoprolol succinate 50 mg daily, cannot increase due to bradycardia.  - Continue spironolactone 12.5 daily, cannot increase due to hyperkalemia.   - Continue canagliflozin.   - Increase Vericiguat to 5 mg daily.  - Will not start Entresto until creatinine is stabilized.  2. CAD: Cath in 09/2020 with severe 3VD, occluded RCA with collaterals, 99% proximal-mid LAD stenosis, 80% ostial LCx, significant disease in OMs and diagonals.  With low EF and 3VD, recommended CABG.  He saw Dr. Cyndia Bent, and is adamant that he does not want to have surgery.  Dr. Cyndia Bent wanted him to get an MRI for viability, but if he is not going to have CABG, would not get MRI.  He has not had recent chest pain.  Dr. Aundra Dubin reviewed his cath films with Dr. Burt Knack for PCI options.  Could potentially intervene on LAD, but would essentially be a CTO procedure with increased risk to the patient (including worsening renal function).  Based on recent REVIVED-BCIS trial, do not think that Mr. Ingman would derive much benefit from PCI in this situation in the absence of significant angina (which he does not have currently).  - Continue atorvastatin and Zetia.  - He is not on ASA due to Eliquis use.  3. Atrial fibrillation: DCCV to NSR in 10/2020. -  Continue amiodarone 100 mg daily.  -  Consider AF ablation eventually.  This may allow [place] to stop amiodarone.  4. CVA: 03/2020, likely due to AF.  5. CKD: stage 3.  Post-transplant.   6. Hyperlipidemia: He is on atorvastatin 10 mg daily and Zetia, will need to check lipids next appointment.    Follow up 3 weeks with Dr. Rush Farmer, PharmD, BCPS, [facility], [facility] Pharmacist [phone]

## 2020-11-13 NOTE — Telephone Encounter (Signed)
Patient Advocate Encounter   Received notification from [facility] that prior authorization for Daniel Myers is required.   PA submitted on CoverMyMeds Key [id number] Status is pending   Will continue to follow.

## 2020-11-14 ENCOUNTER — Other Ambulatory Visit (HOSPITAL_COMMUNITY): Payer: Self-pay

## 2020-11-14 NOTE — Telephone Encounter (Signed)
Advanced Heart Failure Patient Advocate Encounter  Prior Authorization for Daniel Myers has been approved.    Effective dates: 11/13/20 through 11/13/21  Patients co-pay is $30.86 (30 days)  Called to talk to patient about co-pay and the possibility of using the PAN grant for the [facility] as well as Entresto. Left patient message.

## 2020-11-17 ENCOUNTER — Other Ambulatory Visit: Payer: Self-pay | Admitting: Family Medicine

## 2020-11-17 DIAGNOSIS — E782 Mixed hyperlipidemia: Secondary | ICD-10-CM

## 2020-11-18 NOTE — Telephone Encounter (Signed)
Advanced Heart Failure Patient Advocate Encounter  Patient's daughter called back. I advised her of the [facility] co-pay and the option to use the grant for [facility] as well. Verbalized understanding.   Charlann Boxer, CPhT

## 2020-11-26 ENCOUNTER — Ambulatory Visit (HOSPITAL_COMMUNITY)
Admission: RE | Admit: 2020-11-26 | Discharge: 2020-11-26 | Disposition: A | Discharge status: Home / Self Care | Payer: Medicare Other | Source: Ambulatory Visit | Attending: Cardiology | Admitting: Cardiology

## 2020-11-26 ENCOUNTER — Other Ambulatory Visit: Payer: Self-pay

## 2020-11-26 VITALS — BP 142/64 | HR 48 | Wt 193.4 lb

## 2020-11-26 DIAGNOSIS — N183 Chronic kidney disease, stage 3 unspecified: Secondary | ICD-10-CM | POA: Insufficient documentation

## 2020-11-26 DIAGNOSIS — Z940 Kidney transplant status: Secondary | ICD-10-CM | POA: Diagnosis not present

## 2020-11-26 DIAGNOSIS — E785 Hyperlipidemia, unspecified: Secondary | ICD-10-CM | POA: Insufficient documentation

## 2020-11-26 DIAGNOSIS — Z09 Encounter for follow-up examination after completed treatment for conditions other than malignant neoplasm: Secondary | ICD-10-CM | POA: Diagnosis not present

## 2020-11-26 DIAGNOSIS — I255 Ischemic cardiomyopathy: Secondary | ICD-10-CM | POA: Diagnosis not present

## 2020-11-26 DIAGNOSIS — Z7901 Long term (current) use of anticoagulants: Secondary | ICD-10-CM | POA: Diagnosis not present

## 2020-11-26 DIAGNOSIS — Z79899 Other long term (current) drug therapy: Secondary | ICD-10-CM | POA: Insufficient documentation

## 2020-11-26 DIAGNOSIS — Z8673 Personal history of transient ischemic attack (TIA), and cerebral infarction without residual deficits: Secondary | ICD-10-CM | POA: Insufficient documentation

## 2020-11-26 DIAGNOSIS — I5022 Chronic systolic (congestive) heart failure: Secondary | ICD-10-CM | POA: Insufficient documentation

## 2020-11-26 DIAGNOSIS — Z8616 Personal history of COVID-19: Secondary | ICD-10-CM | POA: Diagnosis not present

## 2020-11-26 DIAGNOSIS — I2510 Atherosclerotic heart disease of native coronary artery without angina pectoris: Secondary | ICD-10-CM | POA: Insufficient documentation

## 2020-11-26 DIAGNOSIS — I4819 Other persistent atrial fibrillation: Secondary | ICD-10-CM | POA: Diagnosis not present

## 2020-11-26 DIAGNOSIS — I252 Old myocardial infarction: Secondary | ICD-10-CM | POA: Diagnosis not present

## 2020-11-26 MED ORDER — VERQUVO 5 MG PO TABS: 5.0000 mg | ORAL_TABLET | Freq: Every day | ORAL | 11 refills | Status: DC

## 2020-11-26 NOTE — Patient Instructions (Signed)
It was a pleasure seeing you today!  MEDICATIONS: -We are changing your medications today -Increase Verquvo to 5 mg (1 tablet) daily. You may take 2 tablets of the 2.5 mg strength daily until you pick up the new strength.  -Call if you have questions about your medications.  NEXT APPOINTMENT: Return to clinic in 3 weeks with Dr. Aundra Dubin.  In general, to take care of your heart failure: -Limit your fluid intake to 2 Liters (half-gallon) per day.   -Limit your salt intake to ideally 2-3 grams (2000-3000 mg) per day. -Weigh yourself daily and record, and bring that "weight diary" to your next appointment.  (Weight gain of 2-3 pounds in 1 day typically means fluid weight.) -The medications for your heart are to help your heart and help you live longer.   -Please contact us before stopping any of your heart medications.  Call the clinic at [phone] with questions or to reschedule future appointments.

## 2020-12-03 ENCOUNTER — Telehealth (HOSPITAL_COMMUNITY): Payer: Self-pay | Admitting: *Deleted

## 2020-12-03 NOTE — Telephone Encounter (Signed)
Pt called c/o light headedness and weakness since increasing verquvo to  5mg . Pt asked if he should decrease to 2.5mg .   Routed to Dr.McLean

## 2020-12-03 NOTE — Telephone Encounter (Signed)
Pts daughter aware and agreeable with plan.

## 2020-12-03 NOTE — Telephone Encounter (Signed)
Ok to drop to 2.5 mg daily.

## 2020-12-17 ENCOUNTER — Other Ambulatory Visit: Payer: Self-pay

## 2020-12-17 ENCOUNTER — Other Ambulatory Visit (HOSPITAL_COMMUNITY): Payer: Self-pay

## 2020-12-17 ENCOUNTER — Ambulatory Visit (HOSPITAL_COMMUNITY)
Admission: RE | Admit: 2020-12-17 | Discharge: 2020-12-17 | Disposition: A | Discharge status: Home / Self Care | Payer: Medicare Other | Source: Ambulatory Visit | Attending: Cardiology | Admitting: Cardiology

## 2020-12-17 VITALS — BP 144/78 | HR 55 | Wt 196.8 lb

## 2020-12-17 DIAGNOSIS — I4891 Unspecified atrial fibrillation: Secondary | ICD-10-CM

## 2020-12-17 DIAGNOSIS — Z7901 Long term (current) use of anticoagulants: Secondary | ICD-10-CM | POA: Insufficient documentation

## 2020-12-17 DIAGNOSIS — I130 Hypertensive heart and chronic kidney disease with heart failure and stage 1 through stage 4 chronic kidney disease, or unspecified chronic kidney disease: Secondary | ICD-10-CM | POA: Diagnosis not present

## 2020-12-17 DIAGNOSIS — Z79899 Other long term (current) drug therapy: Secondary | ICD-10-CM | POA: Insufficient documentation

## 2020-12-17 DIAGNOSIS — I5022 Chronic systolic (congestive) heart failure: Secondary | ICD-10-CM | POA: Diagnosis not present

## 2020-12-17 DIAGNOSIS — I255 Ischemic cardiomyopathy: Secondary | ICD-10-CM | POA: Insufficient documentation

## 2020-12-17 DIAGNOSIS — N183 Chronic kidney disease, stage 3 unspecified: Secondary | ICD-10-CM | POA: Insufficient documentation

## 2020-12-17 DIAGNOSIS — Z794 Long term (current) use of insulin: Secondary | ICD-10-CM | POA: Insufficient documentation

## 2020-12-17 DIAGNOSIS — Z8249 Family history of ischemic heart disease and other diseases of the circulatory system: Secondary | ICD-10-CM | POA: Insufficient documentation

## 2020-12-17 DIAGNOSIS — Z8673 Personal history of transient ischemic attack (TIA), and cerebral infarction without residual deficits: Secondary | ICD-10-CM | POA: Diagnosis not present

## 2020-12-17 DIAGNOSIS — E875 Hyperkalemia: Secondary | ICD-10-CM | POA: Diagnosis not present

## 2020-12-17 DIAGNOSIS — I4819 Other persistent atrial fibrillation: Secondary | ICD-10-CM | POA: Diagnosis not present

## 2020-12-17 DIAGNOSIS — E1122 Type 2 diabetes mellitus with diabetic chronic kidney disease: Secondary | ICD-10-CM | POA: Insufficient documentation

## 2020-12-17 DIAGNOSIS — E782 Mixed hyperlipidemia: Secondary | ICD-10-CM

## 2020-12-17 DIAGNOSIS — Z940 Kidney transplant status: Secondary | ICD-10-CM | POA: Diagnosis not present

## 2020-12-17 DIAGNOSIS — Z8616 Personal history of COVID-19: Secondary | ICD-10-CM | POA: Diagnosis not present

## 2020-12-17 DIAGNOSIS — Z7984 Long term (current) use of oral hypoglycemic drugs: Secondary | ICD-10-CM | POA: Diagnosis not present

## 2020-12-17 DIAGNOSIS — I214 Non-ST elevation (NSTEMI) myocardial infarction: Secondary | ICD-10-CM | POA: Diagnosis not present

## 2020-12-17 DIAGNOSIS — I2510 Atherosclerotic heart disease of native coronary artery without angina pectoris: Secondary | ICD-10-CM | POA: Insufficient documentation

## 2020-12-17 DIAGNOSIS — Z833 Family history of diabetes mellitus: Secondary | ICD-10-CM | POA: Insufficient documentation

## 2020-12-17 LAB — COMPREHENSIVE METABOLIC PANEL
ALT: 16 U/L (ref 0–44)
AST: 22 U/L (ref 15–41)
Albumin: 4.1 g/dL (ref 3.5–5.0)
Alkaline Phosphatase: 53 U/L (ref 38–126)
Anion gap: 11 (ref 5–15)
BUN: 41 mg/dL — ABNORMAL HIGH (ref 8–23)
CO2: 26 mmol/L (ref 22–32)
Calcium: 8.7 mg/dL — ABNORMAL LOW (ref 8.9–10.3)
Chloride: 100 mmol/L (ref 98–111)
Creatinine, Ser: 2.29 mg/dL — ABNORMAL HIGH (ref 0.61–1.24)
GFR, Estimated: 29 mL/min — ABNORMAL LOW (ref >60)
Glucose, Bld: 98 mg/dL (ref 70–99)
Potassium: 4.7 mmol/L (ref 3.5–5.1)
Sodium: 137 mmol/L (ref 135–145)
Total Bilirubin: 0.8 mg/dL (ref 0.3–1.2)
Total Protein: 6.7 g/dL (ref 6.5–8.1)

## 2020-12-17 LAB — BRAIN NATRIURETIC PEPTIDE: B Natriuretic Peptide: 573.5 pg/mL — ABNORMAL HIGH (ref 0.0–100.0)

## 2020-12-17 LAB — TSH: TSH: 3.051 u[IU]/mL (ref 0.350–4.500)

## 2020-12-17 MED ORDER — ENTRESTO 24-26 MG PO TABS: 1.0000 | ORAL_TABLET | Freq: Two times a day (BID) | ORAL | 11 refills | Status: DC

## 2020-12-17 MED ORDER — FUROSEMIDE 20 MG PO TABS: 20.0000 mg | ORAL_TABLET | Freq: Every day | ORAL | 3 refills | Status: DC

## 2020-12-17 MED ORDER — ATORVASTATIN CALCIUM 40 MG PO TABS: 40.0000 mg | ORAL_TABLET | Freq: Every day | ORAL | 3 refills | Status: DC

## 2020-12-17 NOTE — Patient Instructions (Addendum)
EKG done today.  Labs done today. We will contact you only if your labs are abnormal.  INCREASE Atorvastatin to 40mg  (1 tablet) by mouth daily. (A new prescription was sent into the pharmacy for you.)  DECREASE Lasix to 20mg  (1 tablet) by mouth daily. (A new prescription was sent into the pharmacy for you.)  START Entresto 24-26mg  (1 tablet) by mouth 2 times daily.   No other medication changes were made. Please continue all current medications as prescribed.  Your physician recommends that you schedule a follow-up appointment in: 10 days for a lab only appointment(a paper prescription was provided to you during your appointment today) and in 2 months with Dr. Aundra Dubin.   If you have any questions or concerns before your next appointment please send [place] a message through [facility] or call our office at [phone].    TO LEAVE A MESSAGE FOR THE NURSE SELECT OPTION 2, PLEASE LEAVE A MESSAGE INCLUDING: YOUR NAME DATE OF BIRTH CALL BACK NUMBER REASON FOR CALL**this is important as we prioritize the call backs  YOU WILL RECEIVE A CALL BACK THE SAME DAY AS LONG AS YOU CALL BEFORE 4:00 PM   Do the following things EVERYDAY: Weigh yourself in the morning before breakfast. Write it down and keep it in a log. Take your medicines as prescribed Eat low salt foods--Limit salt (sodium) to 2000 mg per day.  Stay as active as you can everyday Limit all fluids for the day to less than 2 liters   At the [facility], you and your health needs are our priority. As part of our continuing mission to provide you with exceptional heart care, we have created designated Provider Care Teams. These Care Teams include your primary Cardiologist (physician) and Advanced Practice Providers (APPs- Physician Assistants and Nurse Practitioners) who all work together to provide you with the care you need, when you need it.   You may see any of the following providers on your designated Care Team at  your next follow up: Dr Glori Bickers Dr Haynes Kerns, NP Lyda Jester, [place] Audry Riles, PharmD   Please be sure to bring in all your medications bottles to every appointment.

## 2020-12-18 NOTE — Progress Notes (Signed)
PCP: Claretta Fraise, MD Cardiology: Dr. Domenic Polite HF Cardiology: Dr. Aundra Dubin  75 y.o. with history of suspected CAD/NSTEMI, CKD stage 3 s/p renal transplant, chronic systolic CHF, and persistent atrial fibrillation was referred by Dr. Domenic Polite for evaluation of CHF, atrial fibrillation, and suspected CAD.  Patient was doing fairly well until 1/22.  He developed COVID-19 PNA.  Hospitalization was complicated by NSTEMI and the onset of atrial fibrillation.  Cath was not done with elevated creatinine.  Echo in 1/22 showed EF 40-45% with wall motion abnormalities.  Cardiolite done as an outpatient in 3/22 showed EF 21%, inferior and inferolateral fixed defect.    Patient had CVA in 2/22, likely related to atrial fibrillation.  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  He did PT.   Repeat echo in 4/22 showed EF down further to 25-30%.  RHC/LHC was done in 8/22.  This showed severe 3VD with occluded RCA with collaterals, 99% proximal-mid LAD stenosis, 80% ostial LCx, significant disease in OMs and diagonals.  RHC showed normal filling pressures and preserved cardiac output.   He saw Dr. Cyndia Bent and decided that he did not want to undergo CABG.  I reviewed the cath films with Dr. Burt Knack. Could potentially intervene on LAD, but would essentially be a CTO procedure with increased risk to the patient (including worsening renal function).  Based on recently released REVIVED-BCIS trial, I do not think that Mr. Perry would derive much benefit from PCI in this situation in the absence of significant angina (which he does not seem to have.   In 9/22, he had DCCV back to NSR.  HR in 40s at rest.   Patient returns for followup of CHF, CAD, and atrial fibrillation.  He remains in NSR today.  No chest pain.  No dyspnea walking on flat ground.  Gets tired with heavy exertion like doing the laundry. No orthopnea/PND.  No palpitations.  No lightheadedness or syncope.    ECG (personally reviewed): NSR at 56 bpm,  old inferior MI, old anterior MI.   Labs (6/22): LDL 92, HDL 40, K 5.1, creatinine 1.7 Labs (8/22): K 5.4, creatinine 2.16, LFTs normal, TSH normal, plts 123, hgb 15.2 Labs (9/22): K 4.7, creatinine 2.11  PMH: 1. Type 2 diabetes 2. HTN 3. Hyperlipidemia 4. H/o COVID-19 PNA in 1/22.  5. CKD: Stage 3.  History of renal transplant.   6. 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  7. Thrombocytopenia 8. Skin cancer 9. OSA 10. CAD: NSTEMI in 1/22 during admission for COVID PNA.  HS-TnI to 8647.  Not cathed with elevated creatinine.  - Cardiolite (3/22): EF 21%, inferior and inferolateral fixed defect, apical fixed defect.  - LHC (8/22): Severe 3VD with occluded RCA with collaterals, 99% proximal-mid LAD stenosis, 80% ostial LCx, significant disease in OMs and diagonals. 11. Chronic systolic CHF: Ischemic cardiomyopathy.  - Echo (1/22): EF 40-45%, wall motion abnormalities.  - Echo (4/22): EF 25-30%, global hypokinesis.  - RHC/LHC (8/22): severe 3VD with occluded RCA with collaterals, 99% proximal-mid LAD stenosis, 80% ostial LCx, significant disease in OMs and diagonals. Mean RA 2, PA 21/7, mean PCWP 11, CI 2.2 Fick/2.33 thermodilution 12. Atrial fibrillation: Persistent since 1/22. DCCV to NSR in 9/22.   Social History   Socioeconomic History   Marital status: Married    Spouse name: Not on file   Number of children: Not on file   Years of education: Not on file   Highest education level: Not  on file  Occupational History   Not on file  Tobacco Use   Smoking status: Some Days    Types: Pipe   Smokeless tobacco: Never  Vaping Use   Vaping Use: Never used  Substance and Sexual Activity   Alcohol use: Never   Drug use: Never   Sexual activity: Not on file  Other Topics Concern   Not on file  Social History Narrative   Not on file   Social Determinants of Health   Financial Resource Strain: Low Risk    Difficulty of Paying Living Expenses: Not hard at  all  Food Insecurity: No Food Insecurity   Worried About Charity fundraiser in the Last Year: Never true   [place] in the Last Year: Never true  Transportation Needs: No Transportation Needs   Lack of Transportation (Medical): No   Lack of Transportation (Non-Medical): No  Physical Activity: Inactive   Days of Exercise per Week: 0 days   Minutes of Exercise per Session: 0 min  Stress: Not on file  Social Connections: Socially Isolated   Frequency of Communication with Friends and Family: More than three times a week   Frequency of Social Gatherings with Friends and Family: More than three times a week   Attends Religious Services: Never   Marine scientist or Organizations: No   Attends Archivist Meetings: Never   Marital Status: Widowed  Human resources officer Violence: Not on file   Family History  Problem Relation Age of Onset   Clotting disorder Mother    Hypertension Sister    Diabetes Sister    ROS: All systems reviewed and negative except as per HPI.   Current Outpatient Medications  Medication Sig Dispense Refill   acetaminophen (TYLENOL) 500 MG tablet Take 500 mg by mouth every 8 (eight) hours as needed for moderate pain.     albuterol (VENTOLIN HFA) 108 (90 Base) MCG/ACT inhaler Inhale 2 puffs into the lungs every 6 (six) hours as needed for wheezing or shortness of breath. 1 each 3   ALPRAZolam (XANAX) 0.25 MG tablet Take 0.5 mg by mouth at bedtime as needed for anxiety.     amiodarone (PACERONE) 200 MG tablet Take 0.5 tablets (100 mg total) by mouth daily. 30 tablet 11   apixaban (ELIQUIS) 5 MG TABS tablet Take 1 tablet (5 mg total) by mouth 2 (two) times daily. 180 tablet 3   ascorbic acid (VITAMIN C) 500 MG tablet Take 1 tablet (500 mg total) by mouth daily.     blood glucose meter kit and supplies Dispense based on patient and insurance preference. Use up to four times daily as directed. (FOR ICD-10 E10.9, E11.9). 1 each 0   ezetimibe (ZETIA) 10  MG tablet Take 1 tablet (10 mg total) by mouth daily. 30 tablet 11   fluticasone (FLONASE) 50 MCG/ACT nasal spray USE ONE SPRAY(S) IN EACH NOSTRIL ONCE DAILY (Patient taking differently: USE ONE SPRAY(S) IN EACH NOSTRIL ONCE DAILY or as needed) 16 g 11   insulin NPH Human (NOVOLIN N) 100 UNIT/ML injection 10 units AC breakfast and 10 units AC supper 30 mL 11   insulin regular (NOVOLIN R) 100 units/mL injection Inject 18 Units into the skin 2 (two) times daily. 18 units w bkfst and 8 units w dinner     INVOKANA 300 MG TABS tablet TAKE 1 TABLET BY MOUTH ONCE DAILY BEFORE BREAKFAST 90 tablet 0   loratadine (CLARITIN) 10 MG tablet Take  10 mg by mouth daily as needed for allergies.     metoprolol succinate (TOPROL-XL) 50 MG 24 hr tablet Take 50 mg by mouth daily. Take with or immediately following a meal.     mycophenolate (CELLCEPT) 250 MG capsule Take 500 mg by mouth 2 (two) times daily.     nitroGLYCERIN (NITROSTAT) 0.4 MG SL tablet Place 1 tablet (0.4 mg total) under the tongue every 5 (five) minutes as needed for chest pain. 25 tablet 3   ondansetron (ZOFRAN) 4 MG tablet Take 1 tablet (4 mg total) by mouth every 8 (eight) hours as needed for nausea or vomiting. 20 tablet 0   predniSONE (DELTASONE) 5 MG tablet Take 5 mg by mouth daily with breakfast.     RELION INSULIN SYRINGE 31G X 15/64" 0.5 ML MISC USE AS DIRECTED FOR INSULIN     sacubitril-valsartan (ENTRESTO) 24-26 MG Take 1 tablet by mouth 2 (two) times daily. 60 tablet 11   sodium bicarbonate 650 MG tablet Take 1,300 mg by mouth 2 (two) times daily.     spironolactone (ALDACTONE) 25 MG tablet Take 0.5 tablets (12.5 mg total) by mouth daily.     sulfamethoxazole-trimethoprim (BACTRIM) 400-80 MG tablet Take 1 tablet by mouth every Monday, Wednesday, and Friday.     tacrolimus (PROGRAF) 0.5 MG capsule Take 1 mg by mouth in the morning and at bedtime.     Vericiguat (VERQUVO) 2.5 MG TABS Take 2.5 mg by mouth daily.     zinc sulfate 220 (50 Zn) MG  capsule Take 1 capsule (220 mg total) by mouth daily.     atorvastatin (LIPITOR) 40 MG tablet Take 1 tablet (40 mg total) by mouth daily. 90 tablet 3   furosemide (LASIX) 20 MG tablet Take 1 tablet (20 mg total) by mouth daily. 90 tablet 3   No current facility-administered medications for this encounter.   BP (!) 144/78   Pulse (!) 55   Wt 89.3 kg (196 lb 12.8 oz)   SpO2 99%   BMI 28.24 kg/m  General: NAD Neck: No JVD, no thyromegaly or thyroid nodule.  Lungs: Clear to auscultation bilaterally with normal respiratory effort. CV: Nondisplaced PMI.  Heart regular S1/S2, no S3/S4, no murmur.  No peripheral edema.  No carotid bruit.  Normal pedal pulses.  Abdomen: Soft, nontender, no hepatosplenomegaly, no distention.  Skin: Intact without lesions or rashes.  Neurologic: Alert and oriented x 3.  Psych: Normal affect. Extremities: No clubbing or cyanosis.  HEENT: Normal.   Assessment/Plan: 1. Chronic systolic CHF: Echo in 6/43 with EF 25-30%.   Patient had NSTEMI during 1/22 admission with COVID-19, not cathed with CKD.  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 RHC in 8/22 with preserved cardiac output and low filling pressures.  He is not volume overloaded, NYHA class II symptoms.  Medication titration limited by CKD and bradycardia.  - Start Entresto 24/26 bid and decrease Lasix to 20 mg daily.  BMET today and in 10 days.  Will need to be careful with CKD stage 3.  - Continue Toprol XL 50 mg daily, cannot increase due to bradycardia.  - Continue spironolactone 12.5 daily, cannot increase due to hyperkalemia.  - Continue canagliflozin.   - Continue vericiguat 2.5 mg daily, did not tolerate 5 mg daily.  - He says that he would not be interested in ICD.  2. CAD: Cath in 8/22 with severe 3VD, occluded RCA with collaterals, 99% proximal-mid LAD stenosis, 80% ostial LCx, significant disease in OMs and  diagonals.  With low EF and 3VD, I recommended CABG.  He saw Dr. Cyndia Bent, and is adamant  that he does not want to have surgery.  Dr. Cyndia Bent wanted him to get an MRI for viability, but if he is not going to have CABG, would not get MRI.  He has not had recent chest pain.  I reviewed his cath films with Dr. Burt Knack for PCI options.  Could potentially intervene on LAD, but would essentially be a CTO procedure with increased risk to the patient (including worsening renal function).  Based on recent REVIVED-BCIS trial, I do not think that Mr. Corkum would derive much benefit from PCI in this situation in the absence of significant angina (which he does not have currently).  - Increase atorvastatin to 40 mg daily to get LDL < 70.  Lipids/LFTs in 2 months.  - He is not on ASA due to Eliquis use.  3. Atrial fibrillation: DCCV to NSR in 9/22, he is in NSR today.  -  Continue amiodarone 100 mg daily.  Check LFTs and TSH today, he will need regular eye exam while on amiodarone.  -  We discussed AF ablation, but he is not interested in invasive procedures.   4. 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  5. CKD: stage 3.  Post-transplant.    Patient is willing to take medications but does not want any invasive procedures.    Followup in 2 months.   Loralie Champagne 12/18/2020

## 2020-12-19 DIAGNOSIS — I639 Cerebral infarction, unspecified: Secondary | ICD-10-CM | POA: Insufficient documentation

## 2020-12-23 ENCOUNTER — Encounter (HOSPITAL_COMMUNITY): Payer: Self-pay

## 2021-01-28 ENCOUNTER — Ambulatory Visit (INDEPENDENT_AMBULATORY_CARE_PROVIDER_SITE_OTHER): Payer: Medicare Other | Admitting: Family Medicine

## 2021-01-28 ENCOUNTER — Encounter: Payer: Self-pay | Admitting: Family Medicine

## 2021-01-28 VITALS — BP 132/58 | HR 71 | Temp 96.9°F | Ht 70.0 in | Wt 195.8 lb

## 2021-01-28 DIAGNOSIS — E782 Mixed hyperlipidemia: Secondary | ICD-10-CM | POA: Diagnosis not present

## 2021-01-28 DIAGNOSIS — Z940 Kidney transplant status: Secondary | ICD-10-CM

## 2021-01-28 DIAGNOSIS — E119 Type 2 diabetes mellitus without complications: Secondary | ICD-10-CM

## 2021-01-28 DIAGNOSIS — I5041 Acute combined systolic (congestive) and diastolic (congestive) heart failure: Secondary | ICD-10-CM | POA: Diagnosis not present

## 2021-01-28 DIAGNOSIS — I4819 Other persistent atrial fibrillation: Secondary | ICD-10-CM | POA: Diagnosis not present

## 2021-01-28 DIAGNOSIS — I2510 Atherosclerotic heart disease of native coronary artery without angina pectoris: Secondary | ICD-10-CM | POA: Diagnosis not present

## 2021-01-28 DIAGNOSIS — Z794 Long term (current) use of insulin: Secondary | ICD-10-CM

## 2021-01-28 DIAGNOSIS — I10 Essential (primary) hypertension: Secondary | ICD-10-CM | POA: Diagnosis not present

## 2021-01-28 NOTE — Progress Notes (Signed)
Subjective:  Patient ID: CANDELARIO STEPPE,  male    DOB: [birth year]  Age: 75 y.o.    CC: Medical Management of Chronic Issues   HPI BARRET ESQUIVEL presents for  follow-up of hypertension. Patient has no history of headache chest pain or shortness of breath or recent cough. Patient also denies symptoms of TIA such as numbness weakness lateralizing. Patient denies side effects from medication. States taking it regularly.  Patient also  in for follow-up of elevated cholesterol. Doing well without complaints on current medication. Denies side effects  including myalgia and arthralgia and nausea. Also in today for liver function testing. Currently no chest pain, shortness of breath or other cardiovascular related symptoms noted.  Follow-up of diabetes. Patient does not check blood sugar at home. Patient denies symptoms such as excessive hunger or urinary frequency, excessive hunger, nausea No significant hypoglycemic spells noted. Medications reviewed. Pt reports taking them regularly. Pt. denies complication/adverse reaction today.    History Garnie has a past medical history of Acute respiratory failure with hypoxia ([place]) (02/12/2020), AKI (acute kidney injury) ([place]) (02/13/2020), Atrial fibrillation with RVR ([place]) (03/11/2020), Basal cell carcinoma (06/27/2013), Basal cell carcinoma (07/01/2014), Basal cell carcinoma (10/10/2014), Basal cell carcinoma (02/11/2015), Basal cell carcinoma (06/14/2016), Basal cell carcinoma (02/22/2017), Basal cell carcinoma (04/11/2018), Chronic kidney disease, History of renal transplant, Hyperlipidemia, Hypertension, NSTEMI (non-ST elevated myocardial infarction) ([place]) (02/13/2020), 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), 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), 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), Squamous cell carcinoma of skin (11/08/2011), Squamous cell carcinoma of skin (11/08/2011), Squamous cell carcinoma of skin (11/08/2011), Squamous cell  carcinoma of skin (06/27/2013), 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), 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), 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), 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), 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), 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), 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), Squamous cell carcinoma of skin (09/30/2015), Squamous cell carcinoma of skin (02/22/2017), Squamous cell carcinoma of skin (02/22/2017), Squamous cell carcinoma of skin (04/11/2018), Squamous cell carcinoma of skin (04/11/2018), Squamous cell carcinoma of skin (04/11/2018), Squamous cell carcinoma of skin (04/11/2018), Squamous cell carcinoma of skin (04/11/2018), Squamous cell carcinoma of skin (09/28/2018), Squamous cell carcinoma of skin (09/28/2018), Squamous cell carcinoma of skin (09/28/2018), Squamous cell carcinoma of skin (03/21/2019), Squamous cell carcinoma of skin (03/21/2019), Squamous cell carcinoma of skin (03/21/2019), Type 2 diabetes mellitus ([place]), and Unspecified atrial fibrillation ([place]) (02/17/2020).   He has a past surgical history that includes Kidney transplant (Right); RIGHT/LEFT HEART CATH AND CORONARY ANGIOGRAPHY (N/A, 09/09/2020); and Cardioversion (N/A, 10/16/2020).   His family history includes Clotting disorder in his mother; Diabetes in his sister; Hypertension in his sister.He reports that he has been smoking pipe. He has never used smokeless tobacco. He reports that he does not drink alcohol and does not use drugs.  Current Outpatient Medications on File Prior to Visit  Medication Sig Dispense Refill   acetaminophen (TYLENOL) 500 MG tablet Take 500 mg by mouth every 8 (eight) hours as needed for moderate pain.     albuterol (VENTOLIN HFA) 108 (90 Base) MCG/ACT inhaler Inhale 2 puffs into the lungs every 6 (six) hours as needed for wheezing or shortness of breath. 1 each 3   ALPRAZolam (XANAX) 0.25 MG tablet Take 0.5 mg by mouth  at bedtime as needed for anxiety.     amiodarone (PACERONE) 200 MG tablet Take 0.5 tablets (100 mg total) by mouth daily. 30 tablet 11   apixaban (  ELIQUIS) 5 MG TABS tablet Take 1 tablet (5 mg total) by mouth 2 (two) times daily. 180 tablet 3   ascorbic acid (VITAMIN C) 500 MG tablet Take 1 tablet (500 mg total) by mouth daily.     atorvastatin (LIPITOR) 40 MG tablet Take 1 tablet (40 mg total) by mouth daily. 90 tablet 3   blood glucose meter kit and supplies Dispense based on patient and insurance preference. Use up to four times daily as directed. (FOR ICD-10 E10.9, E11.9). 1 each 0   ezetimibe (ZETIA) 10 MG tablet Take 1 tablet (10 mg total) by mouth daily. 30 tablet 11   fluticasone (FLONASE) 50 MCG/ACT nasal spray USE ONE SPRAY(S) IN EACH NOSTRIL ONCE DAILY (Patient taking differently: USE ONE SPRAY(S) IN EACH NOSTRIL ONCE DAILY or as needed) 16 g 11   furosemide (LASIX) 20 MG tablet Take 1 tablet (20 mg total) by mouth daily. 90 tablet 3   insulin NPH Human (NOVOLIN N) 100 UNIT/ML injection 10 units AC breakfast and 10 units AC supper 30 mL 11   insulin regular (NOVOLIN R) 100 units/mL injection Inject 18 Units into the skin 2 (two) times daily. 18 units w bkfst and 8 units w dinner     INVOKANA 300 MG TABS tablet TAKE 1 TABLET BY MOUTH ONCE DAILY BEFORE BREAKFAST 90 tablet 0   loratadine (CLARITIN) 10 MG tablet Take 10 mg by mouth daily as needed for allergies.     metoprolol succinate (TOPROL-XL) 50 MG 24 hr tablet Take 50 mg by mouth daily. Take with or immediately following a meal.     mycophenolate (CELLCEPT) 250 MG capsule Take 500 mg by mouth 2 (two) times daily.     nitroGLYCERIN (NITROSTAT) 0.4 MG SL tablet Place 1 tablet (0.4 mg total) under the tongue every 5 (five) minutes as needed for chest pain. 25 tablet 3   ondansetron (ZOFRAN) 4 MG tablet Take 1 tablet (4 mg total) by mouth every 8 (eight) hours as needed for nausea or vomiting. 20 tablet 0   predniSONE (DELTASONE) 5 MG  tablet Take 5 mg by mouth daily with breakfast.     RELION INSULIN SYRINGE 31G X 15/64" 0.5 ML MISC USE AS DIRECTED FOR INSULIN     sacubitril-valsartan (ENTRESTO) 24-26 MG Take 1 tablet by mouth 2 (two) times daily. 60 tablet 11   sodium bicarbonate 650 MG tablet Take 1,300 mg by mouth 2 (two) times daily.     spironolactone (ALDACTONE) 25 MG tablet Take 0.5 tablets (12.5 mg total) by mouth daily.     sulfamethoxazole-trimethoprim (BACTRIM) 400-80 MG tablet Take 1 tablet by mouth every Monday, Wednesday, and Friday.     tacrolimus (PROGRAF) 0.5 MG capsule Take 1 mg by mouth in the morning and at bedtime.     Vericiguat (VERQUVO) 2.5 MG TABS Take 2.5 mg by mouth daily.     zinc sulfate 220 (50 Zn) MG capsule Take 1 capsule (220 mg total) by mouth daily.     No current facility-administered medications on file prior to visit.    ROS Review of Systems  Constitutional:  Negative for fever.  Respiratory:  Negative for shortness of breath.   Cardiovascular:  Negative for chest pain.  Musculoskeletal:  Negative for arthralgias.  Skin:  Negative for rash.   Objective:  BP (!) 132/58    Pulse 71    Temp (!) 96.9 F (36.1 C)    Ht '5\' 10"'  (1.778 m)    Wt 195  lb 12.8 oz (88.8 kg)    SpO2 97%    BMI 28.09 kg/m   BP Readings from Last 3 Encounters:  01/28/21 (!) 132/58  12/17/20 (!) 144/78  11/26/20 (!) 142/64    Wt Readings from Last 3 Encounters:  01/28/21 195 lb 12.8 oz (88.8 kg)  12/17/20 196 lb 12.8 oz (89.3 kg)  11/26/20 193 lb 6.4 oz (87.7 kg)     Physical Exam Vitals reviewed.  Constitutional:      Appearance: He is well-developed.  HENT:     Head: Normocephalic and atraumatic.     Right Ear: External ear normal.     Left Ear: External ear normal.     Mouth/Throat:     Pharynx: No oropharyngeal exudate or posterior oropharyngeal erythema.  Eyes:     Pupils: Pupils are equal, round, and reactive to light.  Cardiovascular:     Rate and Rhythm: Normal rate and regular  rhythm.     Heart sounds: No murmur heard. Pulmonary:     Effort: No respiratory distress.     Breath sounds: Normal breath sounds.  Musculoskeletal:     Cervical back: Normal range of motion and neck supple.  Neurological:     Mental Status: He is alert and oriented to person, place, and time.    Diabetic Foot Exam - Simple   No data filed       Assessment & Plan:   Yasmin was seen today for medical management of chronic issues.  Diagnoses and all orders for this visit:  Systolic and diastolic CHF, acute ([place])  Persistent atrial fibrillation ([place])  Essential hypertension  Mixed hyperlipidemia  Renal transplant recipient  Insulin dependent type 2 diabetes mellitus ([place])   I am having Demarkus L. Stamps maintain his tacrolimus, sodium bicarbonate, mycophenolate, ReliOn Insulin Syringe, loratadine, blood glucose meter kit and supplies, zinc sulfate, ascorbic acid, albuterol, predniSONE, ondansetron, ALPRAZolam, fluticasone, spironolactone, insulin NPH Human, ezetimibe, acetaminophen, insulin regular, metoprolol succinate, amiodarone, nitroGLYCERIN, Invokana, apixaban, sulfamethoxazole-trimethoprim, Verquvo, atorvastatin, furosemide, and Entresto.  No orders of the defined types were placed in this encounter.    Follow-up: Return in about 3 months (around 04/28/2021).  Claretta Fraise, M.D.

## 2021-02-03 ENCOUNTER — Other Ambulatory Visit: Payer: Self-pay | Admitting: Family Medicine

## 2021-02-03 DIAGNOSIS — E782 Mixed hyperlipidemia: Secondary | ICD-10-CM

## 2021-02-04 ENCOUNTER — Other Ambulatory Visit: Payer: Self-pay | Admitting: Family Medicine

## 2021-02-04 DIAGNOSIS — G4700 Insomnia, unspecified: Secondary | ICD-10-CM

## 2021-02-10 ENCOUNTER — Other Ambulatory Visit: Payer: Self-pay | Admitting: Family Medicine

## 2021-02-10 DIAGNOSIS — G4700 Insomnia, unspecified: Secondary | ICD-10-CM

## 2021-02-16 ENCOUNTER — Other Ambulatory Visit: Payer: Self-pay | Admitting: Family Medicine

## 2021-02-16 ENCOUNTER — Other Ambulatory Visit: Payer: Self-pay

## 2021-02-16 ENCOUNTER — Telehealth (HOSPITAL_COMMUNITY): Payer: Self-pay

## 2021-02-16 ENCOUNTER — Ambulatory Visit (HOSPITAL_COMMUNITY)
Admission: RE | Admit: 2021-02-16 | Discharge: 2021-02-16 | Disposition: A | Discharge status: Home / Self Care | Payer: Medicare Other | Source: Ambulatory Visit | Attending: Cardiology | Admitting: Cardiology

## 2021-02-16 VITALS — BP 130/80 | HR 72 | Wt 197.8 lb

## 2021-02-16 DIAGNOSIS — I252 Old myocardial infarction: Secondary | ICD-10-CM | POA: Diagnosis not present

## 2021-02-16 DIAGNOSIS — I5022 Chronic systolic (congestive) heart failure: Secondary | ICD-10-CM | POA: Insufficient documentation

## 2021-02-16 DIAGNOSIS — I130 Hypertensive heart and chronic kidney disease with heart failure and stage 1 through stage 4 chronic kidney disease, or unspecified chronic kidney disease: Secondary | ICD-10-CM | POA: Insufficient documentation

## 2021-02-16 DIAGNOSIS — Z940 Kidney transplant status: Secondary | ICD-10-CM | POA: Insufficient documentation

## 2021-02-16 DIAGNOSIS — Z8616 Personal history of COVID-19: Secondary | ICD-10-CM | POA: Diagnosis not present

## 2021-02-16 DIAGNOSIS — Z79899 Other long term (current) drug therapy: Secondary | ICD-10-CM | POA: Diagnosis not present

## 2021-02-16 DIAGNOSIS — E782 Mixed hyperlipidemia: Secondary | ICD-10-CM

## 2021-02-16 DIAGNOSIS — Z8673 Personal history of transient ischemic attack (TIA), and cerebral infarction without residual deficits: Secondary | ICD-10-CM | POA: Diagnosis not present

## 2021-02-16 DIAGNOSIS — I2510 Atherosclerotic heart disease of native coronary artery without angina pectoris: Secondary | ICD-10-CM | POA: Insufficient documentation

## 2021-02-16 DIAGNOSIS — I5041 Acute combined systolic (congestive) and diastolic (congestive) heart failure: Secondary | ICD-10-CM | POA: Diagnosis not present

## 2021-02-16 DIAGNOSIS — N183 Chronic kidney disease, stage 3 unspecified: Secondary | ICD-10-CM | POA: Diagnosis not present

## 2021-02-16 DIAGNOSIS — Z7901 Long term (current) use of anticoagulants: Secondary | ICD-10-CM | POA: Insufficient documentation

## 2021-02-16 DIAGNOSIS — E875 Hyperkalemia: Secondary | ICD-10-CM | POA: Diagnosis not present

## 2021-02-16 LAB — CBC
HCT: 38.7 % — ABNORMAL LOW (ref 39.0–52.0)
Hemoglobin: 12.5 g/dL — ABNORMAL LOW (ref 13.0–17.0)
MCH: 29.7 pg (ref 26.0–34.0)
MCHC: 32.3 g/dL (ref 30.0–36.0)
MCV: 91.9 fL (ref 80.0–100.0)
Platelets: 110 10*3/uL — ABNORMAL LOW (ref 150–400)
RBC: 4.21 MIL/uL — ABNORMAL LOW (ref 4.22–5.81)
RDW: 13 % (ref 11.5–15.5)
WBC: 4.7 10*3/uL (ref 4.0–10.5)
nRBC: 0 % (ref 0.0–0.2)

## 2021-02-16 LAB — TSH: TSH: 3.687 u[IU]/mL (ref 0.350–4.500)

## 2021-02-16 LAB — COMPREHENSIVE METABOLIC PANEL
ALT: 16 U/L (ref 0–44)
AST: 19 U/L (ref 15–41)
Albumin: 3.8 g/dL (ref 3.5–5.0)
Alkaline Phosphatase: 53 U/L (ref 38–126)
Anion gap: 10 (ref 5–15)
BUN: 54 mg/dL — ABNORMAL HIGH (ref 8–23)
CO2: 23 mmol/L (ref 22–32)
Calcium: 8.4 mg/dL — ABNORMAL LOW (ref 8.9–10.3)
Chloride: 100 mmol/L (ref 98–111)
Creatinine, Ser: 2.48 mg/dL — ABNORMAL HIGH (ref 0.61–1.24)
GFR, Estimated: 27 mL/min — ABNORMAL LOW (ref >60)
Glucose, Bld: 333 mg/dL — ABNORMAL HIGH (ref 70–99)
Potassium: 4.6 mmol/L (ref 3.5–5.1)
Sodium: 133 mmol/L — ABNORMAL LOW (ref 135–145)
Total Bilirubin: 0.9 mg/dL (ref 0.3–1.2)
Total Protein: 5.9 g/dL — ABNORMAL LOW (ref 6.5–8.1)

## 2021-02-16 MED ORDER — VALSARTAN 40 MG PO TABS: 20.0000 mg | ORAL_TABLET | Freq: Every day | ORAL | 3 refills | Status: DC

## 2021-02-16 NOTE — Telephone Encounter (Addendum)
Spoke with [name] (daughter). She is aware, agreeable, and verbalized understanding. Has prescription to get BMET done locally in 10 days.   ----- Message from Larey Dresser, MD sent at 02/16/2021  1:13 PM EST ----- Call patient, with creatinine higher he should NOT start valsartan.  Repeat the BMET in 2 wks to make sure not progressive.

## 2021-02-16 NOTE — Patient Instructions (Signed)
Medication Changes:  Start Valsartan 20mg  (1/2 Tab) at bedtime  Lab Work:  Labs done today, your results will be available in MyChart, we will contact you for abnormal readings.   Testing/Procedures:  none  Referrals:  Clinic Pharmacist  Special Instructions // Education:  none  Follow-Up in: 3 months  At the [facility], you and your health needs are our priority. We have a designated team specialized in the treatment of Heart Failure. This Care Team includes your primary Heart Failure Specialized Cardiologist (physician), Advanced Practice Providers (APPs- Physician Assistants and Nurse Practitioners), and Pharmacist who all work together to provide you with the care you need, when you need it.   You may see any of the following providers on your designated Care Team at your next follow up:  Dr Glori Bickers Dr Haynes Kerns, NP Lyda Jester, [place] [facility] [place], [place] Audry Riles, PharmD   Please be sure to bring in all your medications bottles to every appointment.   Need to Contact us:  If you have any questions or concerns before your next appointment please send [place] a message through [place] or call our office at [phone].    TO LEAVE A MESSAGE FOR THE NURSE SELECT OPTION 2, PLEASE LEAVE A MESSAGE INCLUDING: YOUR NAME DATE OF BIRTH CALL BACK NUMBER REASON FOR CALL**this is important as we prioritize the call backs  YOU WILL RECEIVE A CALL BACK THE SAME DAY AS LONG AS YOU CALL BEFORE 4:00 PM

## 2021-02-16 NOTE — Progress Notes (Signed)
PCP: Claretta Fraise, MD Cardiology: Dr. Domenic Polite HF Cardiology: Dr. Aundra Dubin  76 y.o. with history of suspected CAD/NSTEMI, CKD stage 3 s/p renal transplant, chronic systolic CHF, and persistent atrial fibrillation was referred by Dr. Domenic Polite for evaluation of CHF, atrial fibrillation, and suspected CAD.  Patient was doing fairly well until 1/22.  He developed COVID-19 PNA.  Hospitalization was complicated by NSTEMI and the onset of atrial fibrillation.  Cath was not done with elevated creatinine.  Echo in 1/22 showed EF 40-45% with wall motion abnormalities.  Cardiolite done as an outpatient in 3/22 showed EF 21%, inferior and inferolateral fixed defect.    Patient had CVA in 2/22, likely related to atrial fibrillation.  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  He did PT.   Repeat echo in 4/22 showed EF down further to 25-30%.  RHC/LHC was done in 8/22.  This showed severe 3VD with occluded RCA with collaterals, 99% proximal-mid LAD stenosis, 80% ostial LCx, significant disease in OMs and diagonals.  RHC showed normal filling pressures and preserved cardiac output.   He saw Dr. Cyndia Bent and decided that he did not want to undergo CABG.  I reviewed the cath films with Dr. Burt Knack. Could potentially intervene on LAD, but would essentially be a CTO procedure with increased risk to the patient (including worsening renal function).  Based on recently released REVIVED-BCIS trial, I do not think that Mr. Lapaglia would derive much benefit from PCI in this situation in the absence of significant angina (which he does not seem to have.   In 9/22, he had DCCV back to NSR.  HR in 40s at rest.   Patient returns for followup of CHF, CAD, and atrial fibrillation.  He remains in NSR today by exam.  No chest pain.  No significant exertional dyspnea with his usual ADLs.  No chest pain.  No palpitations.  No lightheadedness.  Weight stable. He stopped Entresto as it got him lightheaded.   Labs (6/22): LDL 92,  HDL 40, K 5.1, creatinine 1.7 Labs (8/22): K 5.4, creatinine 2.16, LFTs normal, TSH normal, plts 123, hgb 15.2 Labs (9/22): K 4.7, creatinine 2.11 Labs (11/22): K 4.9, creatinine 2.34, LDL 52, TGs 128, BNP 573  PMH: 1. Type 2 diabetes 2. HTN 3. Hyperlipidemia 4. H/o COVID-19 PNA in 1/22.  5. CKD: Stage 3.  History of renal transplant.   6. 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  7. Thrombocytopenia 8. Skin cancer 9. OSA 10. CAD: NSTEMI in 1/22 during admission for COVID PNA.  HS-TnI to 8647.  Not cathed with elevated creatinine.  - Cardiolite (3/22): EF 21%, inferior and inferolateral fixed defect, apical fixed defect.  - LHC (8/22): Severe 3VD with occluded RCA with collaterals, 99% proximal-mid LAD stenosis, 80% ostial LCx, significant disease in OMs and diagonals. 11. Chronic systolic CHF: Ischemic cardiomyopathy.  - Echo (1/22): EF 40-45%, wall motion abnormalities.  - Echo (4/22): EF 25-30%, global hypokinesis.  - RHC/LHC (8/22): severe 3VD with occluded RCA with collaterals, 99% proximal-mid LAD stenosis, 80% ostial LCx, significant disease in OMs and diagonals. Mean RA 2, PA 21/7, mean PCWP 11, CI 2.2 Fick/2.33 thermodilution 12. Atrial fibrillation: Persistent since 1/22. DCCV to NSR in 9/22.   Social History   Socioeconomic History   Marital status: Married    Spouse name: Not on file   Number of children: Not on file   Years of education: Not on file   Highest education level:  Not on file  Occupational History   Not on file  Tobacco Use   Smoking status: Some Days    Types: Pipe   Smokeless tobacco: Never  Vaping Use   Vaping Use: Never used  Substance and Sexual Activity   Alcohol use: Never   Drug use: Never   Sexual activity: Not on file  Other Topics Concern   Not on file  Social History Narrative   Not on file   Social Determinants of Health   Financial Resource Strain: Low Risk    Difficulty of Paying Living Expenses: Not  hard at all  Food Insecurity: No Food Insecurity   Worried About Charity fundraiser in the Last Year: Never true   [place] in the Last Year: Never true  Transportation Needs: No Transportation Needs   Lack of Transportation (Medical): No   Lack of Transportation (Non-Medical): No  Physical Activity: Inactive   Days of Exercise per Week: 0 days   Minutes of Exercise per Session: 0 min  Stress: Not on file  Social Connections: Socially Isolated   Frequency of Communication with Friends and Family: More than three times a week   Frequency of Social Gatherings with Friends and Family: More than three times a week   Attends Religious Services: Never   Marine scientist or Organizations: No   Attends Archivist Meetings: Never   Marital Status: Widowed  Human resources officer Violence: Not on file   Family History  Problem Relation Age of Onset   Clotting disorder Mother    Hypertension Sister    Diabetes Sister    ROS: All systems reviewed and negative except as per HPI.   Current Outpatient Medications  Medication Sig Dispense Refill   acetaminophen (TYLENOL) 500 MG tablet Take 500 mg by mouth every 8 (eight) hours as needed for moderate pain.     albuterol (VENTOLIN HFA) 108 (90 Base) MCG/ACT inhaler Inhale 2 puffs into the lungs every 6 (six) hours as needed for wheezing or shortness of breath. 1 each 3   ALPRAZolam (XANAX) 0.25 MG tablet Take 0.5 mg by mouth at bedtime as needed for anxiety.     amiodarone (PACERONE) 200 MG tablet Take 0.5 tablets (100 mg total) by mouth daily. 30 tablet 11   apixaban (ELIQUIS) 5 MG TABS tablet Take 1 tablet (5 mg total) by mouth 2 (two) times daily. 180 tablet 3   ascorbic acid (VITAMIN C) 500 MG tablet Take 1 tablet (500 mg total) by mouth daily.     atorvastatin (LIPITOR) 40 MG tablet Take 1 tablet (40 mg total) by mouth daily. 90 tablet 3   blood glucose meter kit and supplies Dispense based on patient and insurance  preference. Use up to four times daily as directed. (FOR ICD-10 E10.9, E11.9). 1 each 0   ezetimibe (ZETIA) 10 MG tablet Take 1 tablet (10 mg total) by mouth daily. 30 tablet 11   fluticasone (FLONASE) 50 MCG/ACT nasal spray USE ONE SPRAY(S) IN EACH NOSTRIL ONCE DAILY 16 g 11   furosemide (LASIX) 20 MG tablet Take 1 tablet (20 mg total) by mouth daily. 90 tablet 3   insulin NPH Human (NOVOLIN N) 100 UNIT/ML injection 10 units AC breakfast and 10 units AC supper 30 mL 11   insulin regular (NOVOLIN R) 100 units/mL injection Inject 18 Units into the skin 2 (two) times daily. 18 units w bkfst and 8 units w dinner  INVOKANA 300 MG TABS tablet TAKE 1 TABLET BY MOUTH ONCE DAILY BEFORE BREAKFAST 90 tablet 0   loratadine (CLARITIN) 10 MG tablet Take 10 mg by mouth daily as needed for allergies.     metoprolol succinate (TOPROL-XL) 50 MG 24 hr tablet Take 50 mg by mouth daily. Take with or immediately following a meal.     mycophenolate (CELLCEPT) 250 MG capsule Take 500 mg by mouth 2 (two) times daily.     nitroGLYCERIN (NITROSTAT) 0.4 MG SL tablet Place 1 tablet (0.4 mg total) under the tongue every 5 (five) minutes as needed for chest pain. 25 tablet 3   ondansetron (ZOFRAN) 4 MG tablet Take 1 tablet (4 mg total) by mouth every 8 (eight) hours as needed for nausea or vomiting. 20 tablet 0   predniSONE (DELTASONE) 5 MG tablet Take 5 mg by mouth daily with breakfast.     RELION INSULIN SYRINGE 31G X 15/64" 0.5 ML MISC USE AS DIRECTED FOR INSULIN     sodium bicarbonate 650 MG tablet Take 1,300 mg by mouth 2 (two) times daily.     spironolactone (ALDACTONE) 25 MG tablet Take 0.5 tablets (12.5 mg total) by mouth daily.     sulfamethoxazole-trimethoprim (BACTRIM) 400-80 MG tablet Take 1 tablet by mouth every Monday, Wednesday, and Friday.     tacrolimus (PROGRAF) 0.5 MG capsule Take 1 mg by mouth in the morning and at bedtime.     valsartan (DIOVAN) 40 MG tablet Take 0.5 tablets (20 mg total) by mouth at  bedtime. 15 tablet 3   Vericiguat (VERQUVO) 2.5 MG TABS Take 2.5 mg by mouth daily.     zinc sulfate 220 (50 Zn) MG capsule Take 1 capsule (220 mg total) by mouth daily.     No current facility-administered medications for this encounter.   BP 130/80    Pulse 72    Wt 89.7 kg (197 lb 12.8 oz)    SpO2 98%    BMI 28.38 kg/m  General: NAD Neck: No JVD, no thyromegaly or thyroid nodule.  Lungs: Clear to auscultation bilaterally with normal respiratory effort. CV: Nondisplaced PMI.  Heart regular S1/S2, no S3/S4, no murmur.  No peripheral edema.  No carotid bruit.  Normal pedal pulses.  Abdomen: Soft, nontender, no hepatosplenomegaly, no distention.  Skin: Intact without lesions or rashes.  Neurologic: Alert and oriented x 3.  Psych: Normal affect. Extremities: No clubbing or cyanosis.  HEENT: Normal.   Assessment/Plan: 1. Chronic systolic CHF: Echo in 2/37 with EF 25-30%.   Patient had NSTEMI during 1/22 admission with COVID-19, not cathed with CKD.  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 RHC in 8/22 with preserved cardiac output and low filling pressures.  NYHA class II, not volume overloaded on exam.  Medication titration limited by CKD, orthostatic symptoms, and bradycardia.  - He did not tolerate Entresto due to lightheadedness.  - Recheck BMET today and if creatinine is stable to decreased, will start valsartan 20 mg qhs.  - Continue Toprol XL 50 mg daily.  - Continue spironolactone 12.5 daily, cannot increase due to hyperkalemia.  - Continue canagliflozin.   - Continue vericiguat 2.5 mg daily, did not tolerate 5 mg daily.  - He says that he would not be interested in ICD.  2. CAD: Cath in 8/22 with severe 3VD, occluded RCA with collaterals, 99% proximal-mid LAD stenosis, 80% ostial LCx, significant disease in OMs and diagonals.  With low EF and 3VD, I recommended CABG.  He saw Dr. Cyndia Bent,  and is adamant that he does not want to have surgery.  Dr. Cyndia Bent wanted him to get an MRI for  viability, but if he is not going to have CABG, would not get MRI.  I reviewed his cath films with Dr. Burt Knack for PCI options.  Could potentially intervene on LAD, but would essentially be a CTO procedure with increased risk to the patient (including worsening renal function).  Based on recent REVIVED-BCIS trial, I do not think that Mr. Vicars would derive much benefit from PCI in this situation in the absence of significant angina (which he does not have currently).  No chest pain.  - Continue atorvastatin, good lipids in 11/22.  - He is not on ASA due to Eliquis use.  3. Atrial fibrillation: DCCV to NSR in 9/22, he is in NSR today.  -  Continue amiodarone 100 mg daily.  Check LFTs and TSH today, he will need regular eye exam while on amiodarone.  -  We have discussed AF ablation, but he is not interested in invasive procedures.   4. 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  5. CKD: stage 3.  Post-transplant.   - BMET today.   Patient is willing to take medications but does not want any invasive procedures.    Followup 3-4 wks with HF pharmacist for med titration, see APP in 3 months.   Loralie Champagne 02/16/2021

## 2021-02-17 DIAGNOSIS — Z029 Encounter for administrative examinations, unspecified: Secondary | ICD-10-CM

## 2021-02-23 ENCOUNTER — Other Ambulatory Visit: Payer: Self-pay | Admitting: Family Medicine

## 2021-02-23 ENCOUNTER — Encounter: Payer: Self-pay | Admitting: Family Medicine

## 2021-02-23 DIAGNOSIS — G4700 Insomnia, unspecified: Secondary | ICD-10-CM

## 2021-02-23 MED ORDER — ALPRAZOLAM 0.25 MG PO TABS: 0.5000 mg | ORAL_TABLET | Freq: Every evening | ORAL | 1 refills | Status: DC | PRN

## 2021-02-23 NOTE — Telephone Encounter (Signed)
Please let him know I sent in the refill. Can you help with the prior authorization he requested for invokana?

## 2021-02-24 ENCOUNTER — Telehealth: Payer: Self-pay | Admitting: Family Medicine

## 2021-02-24 NOTE — Telephone Encounter (Signed)
Key: [id number] help? Call us at [phone] Status Sent to Plan today Drug Invokana 300MG  tablets

## 2021-02-25 ENCOUNTER — Other Ambulatory Visit: Payer: Self-pay | Admitting: Family Medicine

## 2021-02-25 MED ORDER — DAPAGLIFLOZIN PROPANEDIOL 10 MG PO TABS: 10.0000 mg | ORAL_TABLET | Freq: Every day | ORAL | 3 refills | Status: DC

## 2021-02-25 NOTE — Telephone Encounter (Signed)
Daughter is aware of prescription at pharmacy.

## 2021-02-25 NOTE — Telephone Encounter (Signed)
[place] scrip sent to [facility]

## 2021-02-25 NOTE — Telephone Encounter (Signed)
Denied- non formulary  Daniel Myers and [name] are on formulary

## 2021-02-26 ENCOUNTER — Other Ambulatory Visit: Payer: Medicare Other

## 2021-02-26 ENCOUNTER — Other Ambulatory Visit: Payer: Self-pay | Admitting: *Deleted

## 2021-02-26 DIAGNOSIS — I5041 Acute combined systolic (congestive) and diastolic (congestive) heart failure: Secondary | ICD-10-CM

## 2021-02-26 DIAGNOSIS — I10 Essential (primary) hypertension: Secondary | ICD-10-CM

## 2021-02-27 LAB — BMP8+EGFR
BUN/Creatinine Ratio: 24 (ref 10–24)
BUN: 49 mg/dL — ABNORMAL HIGH (ref 8–27)
CO2: 22 mmol/L (ref 20–29)
Calcium: 8.6 mg/dL (ref 8.6–10.2)
Chloride: 101 mmol/L (ref 96–106)
Creatinine, Ser: 2.07 mg/dL — ABNORMAL HIGH (ref 0.76–1.27)
Glucose: 119 mg/dL — ABNORMAL HIGH (ref 70–99)
Potassium: 4.1 mmol/L (ref 3.5–5.2)
Sodium: 140 mmol/L (ref 134–144)
eGFR: 33 mL/min/{1.73_m2} — ABNORMAL LOW (ref >59)

## 2021-03-10 ENCOUNTER — Telehealth: Payer: Self-pay | Admitting: Family Medicine

## 2021-03-10 NOTE — Telephone Encounter (Signed)
John called from [facility] stating that pt has requested a blood glucose monitor. Says that they sent [place] the request for it via fax on 1/17. They are going to fax [place] the request again.

## 2021-03-10 NOTE — Progress Notes (Incomplete)
***In Progress***    Advanced Heart Failure Clinic Note   PCP: Claretta Fraise, MD Cardiology: Dr. Domenic Polite HF Cardiology: Dr. Aundra Dubin  HPI:  76 y.o. with history of suspected CAD/NSTEMI, CKD stage 3 s/p renal transplant, chronic systolic CHF, and persistent atrial fibrillation was referred by Dr. Domenic Polite for evaluation of CHF, atrial fibrillation, and suspected CAD.  Patient was doing fairly well until 02/2020.  He developed COVID-19 PNA.  Hospitalization was complicated by NSTEMI and the onset of atrial fibrillation.  Cath was not done with elevated creatinine.  Echo in 02/2020 showed EF 40-45% with wall motion abnormalities. Cardiolite done as an outpatient in 04/2020 showed EF 21%, inferior and inferolateral fixed defect.     Patient had CVA in 03/2020, likely related to atrial fibrillation.  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  He did PT.    Repeat echo in 05/2020 showed EF down further to 25-30%.  RHC/LHC was done in 09/2020.  This showed severe 3VD with occluded RCA with collaterals, 99% proximal-mid LAD stenosis, 80% ostial LCx, significant disease in OMs and diagonals.  RHC showed normal filling pressures and preserved cardiac output.    He saw Dr. Cyndia Bent and decided that he did not want to undergo CABG.  I reviewed the cath films with Dr. Burt Knack. Could potentially intervene on LAD, but would essentially be a CTO procedure with increased risk to the patient (including worsening renal function).  Based on recently released REVIVED-BCIS trial, I do not think that Daniel Myers would derive much benefit from PCI in this situation in the absence of significant angina (which he does not seem to have).    In 10/2020, he had DCCV back to NSR.  HR in 40s at rest.    Seen in clinic 02/16/21 by Dr. Aundra Dubin for follow up of CHF, CAD, and atrial fibrillation.  He remained in NSR at that time by exam.  Reported no chest pain, no significant exertional dyspnea with his usual ADLs, no palpitations  or lightheadedness.  Weight was stable. He stopped Entresto as it made him lightheaded.   Today he returns to HF clinic for pharmacist medication titration. At last visit with MD, plan was initially to start valsartan but with increase in Scr on BMET that day this was discontinued. Since last visit, patient was switched by PCP from [place] to [place] due to insurance formulary change.   Overall feeling ***. Dizziness, lightheadedness, fatigue:  Chest pain or palpitations:  How is your breathing?: *** SOB: Able to complete all ADLs. Activity level ***  Weight at home pounds. Takes furosemide/torsemide/bumex *** mg *** daily.  LEE PND/Orthopnea  Appetite *** Low-salt diet:   Physical Exam Cost/affordability of meds   HF Medications: Metoprolol succinate 50 mg daily Spironolactone 12.5 mg daily Farxiga 10 mg daily Verciguat 2.5 mg daily Lasix 20 mg daily  Has the patient been experiencing any side effects to the medications prescribed?  {YES NO:22349}  Does the patient have any problems obtaining medications due to transportation or finances?   {YES NO:22349}  Understanding of regimen: {excellent/good/fair/poor:19665} Understanding of indications: {excellent/good/fair/poor:19665} Potential of compliance: {excellent/good/fair/poor:19665} Patient understands to avoid NSAIDs. Patient understands to avoid decongestants.    Pertinent Lab Values: 02/26/21: Serum creatinine 2.07, BUN 49, Potassium 4.1, Sodium 140; 03/09/21 Magnesium 2.6  Vital Signs: Weight: *** (last clinic weight: 197.8 lb) Blood pressure: ***  Heart rate: ***   Assessment/Plan: 1. Chronic systolic CHF: Echo in 02/9145 with EF 25-30%.  Patient had NSTEMI during 02/2020 admission with COVID-19, not cathed with CKD.  Ischemic cardiomyopathy based on 09/2020 cath. Entiat in 09/2020 with preserved cardiac output and low filling pressures.  NYHA class II, not volume overloaded on exam.  Medication titration limited by  CKD, orthostatic symptoms, and bradycardia.  - He did not tolerate Entresto due to lightheadedness.  - Recheck BMET today and if creatinine is stable to decreased, will start valsartan 20 mg qhs.  - Continue Toprol XL 50 mg daily.  - Continue spironolactone 12.5 daily, cannot increase due to hyperkalemia.  - Continue Farxiga.   - Continue vericiguat 2.5 mg daily, did not tolerate 5 mg daily.  - He says that he would not be interested in ICD.  2. CAD: Cath in 09/2020 with severe 3VD, occluded RCA with collaterals, 99% proximal-mid LAD stenosis, 80% ostial LCx, significant disease in OMs and diagonals.  With low EF and 3VD, I recommended CABG.  He saw Dr. Cyndia Bent, and is adamant that he does not want to have surgery.  Dr. Cyndia Bent wanted him to get an MRI for viability, but if he is not going to have CABG, would not get MRI.  I reviewed his cath films with Dr. Burt Knack for PCI options.  Could potentially intervene on LAD, but would essentially be a CTO procedure with increased risk to the patient (including worsening renal function).  Based on recent REVIVED-BCIS trial, I do not think that Daniel Myers would derive much benefit from PCI in this situation in the absence of significant angina (which he does not have currently).  No chest pain.  - Continue atorvastatin, good lipids in 12/2020.  - He is not on ASA due to Eliquis use.  3. Atrial fibrillation: DCCV to NSR in 10/2020, he is in NSR today.  -  Continue amiodarone 100 mg daily.  Check LFTs and TSH today, he will need regular eye exam while on amiodarone.  -  We have discussed AF ablation, but he is not interested in invasive procedures.   4. CVA: 03/2020, likely due to AF.  5. CKD: stage 3.  Post-transplant.   - BMET today.    Patient is willing to take medications but does not want any invasive procedures.    Follow up ***. *** PA/NP on 05/18/21.    Daniel Myers, PharmD, BCPS, BCCP, CPP Heart Failure Clinic Pharmacist [phone]

## 2021-03-11 ENCOUNTER — Ambulatory Visit (HOSPITAL_COMMUNITY)
Admission: RE | Admit: 2021-03-11 | Discharge: 2021-03-11 | Disposition: A | Discharge status: Home / Self Care | Payer: Medicare Other | Source: Ambulatory Visit | Attending: Cardiology | Admitting: Cardiology

## 2021-03-11 ENCOUNTER — Other Ambulatory Visit: Payer: Self-pay

## 2021-03-11 VITALS — BP 142/76 | HR 54 | Wt 200.4 lb

## 2021-03-11 DIAGNOSIS — Z8616 Personal history of COVID-19: Secondary | ICD-10-CM | POA: Diagnosis not present

## 2021-03-11 DIAGNOSIS — N189 Chronic kidney disease, unspecified: Secondary | ICD-10-CM | POA: Insufficient documentation

## 2021-03-11 DIAGNOSIS — I252 Old myocardial infarction: Secondary | ICD-10-CM | POA: Insufficient documentation

## 2021-03-11 DIAGNOSIS — Z7901 Long term (current) use of anticoagulants: Secondary | ICD-10-CM | POA: Diagnosis not present

## 2021-03-11 DIAGNOSIS — I2510 Atherosclerotic heart disease of native coronary artery without angina pectoris: Secondary | ICD-10-CM | POA: Insufficient documentation

## 2021-03-11 DIAGNOSIS — I2582 Chronic total occlusion of coronary artery: Secondary | ICD-10-CM | POA: Diagnosis not present

## 2021-03-11 DIAGNOSIS — I4891 Unspecified atrial fibrillation: Secondary | ICD-10-CM | POA: Insufficient documentation

## 2021-03-11 DIAGNOSIS — Z8673 Personal history of transient ischemic attack (TIA), and cerebral infarction without residual deficits: Secondary | ICD-10-CM | POA: Diagnosis not present

## 2021-03-11 DIAGNOSIS — Z79899 Other long term (current) drug therapy: Secondary | ICD-10-CM | POA: Insufficient documentation

## 2021-03-11 DIAGNOSIS — Z7984 Long term (current) use of oral hypoglycemic drugs: Secondary | ICD-10-CM | POA: Diagnosis not present

## 2021-03-11 DIAGNOSIS — Z940 Kidney transplant status: Secondary | ICD-10-CM | POA: Insufficient documentation

## 2021-03-11 DIAGNOSIS — I5022 Chronic systolic (congestive) heart failure: Secondary | ICD-10-CM | POA: Diagnosis present

## 2021-03-11 NOTE — Progress Notes (Signed)
Advanced Heart Failure Clinic Note   PCP: Claretta Fraise, MD Cardiology: Dr. Domenic Polite HF Cardiology: Dr. Aundra Dubin  HPI:  76 y.o. with history of suspected CAD/NSTEMI, CKD stage 3 s/p renal transplant, chronic systolic CHF, and persistent atrial fibrillation was referred by Dr. Domenic Polite for evaluation of CHF, atrial fibrillation, and suspected CAD.  Patient was doing fairly well until 02/2020.  He developed COVID-19 PNA.  Hospitalization was complicated by NSTEMI and the onset of atrial fibrillation.  Cath was not done with elevated creatinine.  Echo in 02/2020 showed EF 40-45% with wall motion abnormalities. Cardiolite done as an outpatient in 04/2020 showed EF 21%, inferior and inferolateral fixed defect.     Patient had CVA in 03/2020, likely related to atrial fibrillation. He had right MCA infarct and was still weak on the left.  He did PT.    Repeat echo in 05/2020 showed EF down further to 25-30%.  RHC/LHC was done in 09/2020.  This showed severe 3VD with occluded RCA with collaterals, 99% proximal-mid LAD stenosis, 80% ostial LCx, significant disease in OMs and diagonals.  RHC showed normal filling pressures and preserved cardiac output.    He saw Dr. Cyndia Bent and decided that he did not want to undergo CABG.  Dr. Aundra Dubin reviewed the cath films with Dr. Burt Knack. Could potentially intervene on LAD, but would essentially be a CTO procedure with increased risk to the patient (including worsening renal function).  Based on recently released REVIVED-BCIS trial, it was thought that Mr. Basford would not derive much benefit from PCI in this situation in the absence of significant angina (which he does not seem to have).    In 10/2020, he had DCCV back to NSR.  HR in 40s at rest.   Seen in clinic 02/16/21 by Dr. Aundra Dubin for follow up of CHF, CAD, and atrial fibrillation. He remained in NSR at that time by exam. Reported no chest pain, no significant exertional dyspnea with his usual ADLs, no palpitations or  lightheadedness.  Weight was stable. He had stopped Entresto as it made him lightheaded.  Today he returns to HF clinic for pharmacist medication titration. At last visit with MD, plan was initially to start valsartan but with increase in Scr on BMET that day this was not started. Since last visit, patient was switched by PCP from [place] to [place] due to insurance formulary change though he will not start taking [place] until April when he finishes his current supply of Invokana. He reports his nephrologist discontinued spironolactone a couple weeks ago due to his kidney function. Overall he is feeling good. Denies dizziness, lightheadedness, fatigue, chest pain, palpitations. No SOB/DOE. Able to complete ADLs. Walking with cane today but walked from scale to exam room unassisted with no problem. Denies LEE, PND, or orthopnea. Weight range at home 196-200 lbs, has been stable. Says his appetite has been "too good" but he is still avoiding salt. Checks BP twice daily at home, usually 130s-140s/70s.   HF Medications: Metoprolol succinate 50 mg daily Invokana 300 mg daily Vericiguat 2.5 mg daily Lasix 20 mg daily  Has the patient been experiencing any side effects to the medications prescribed?  No  Does the patient have any problems obtaining medications due to transportation or finances?   BCBS Medicare. Has PAN grant for [employer]. Invokana is no longer preferred on his formulary so it cost $500 for a 90 day supply in January. Wilder Glade is on formulary this year and will be more affordable. This has  been prescribed by his PCP, but he will switch to [place] in April once he has run out of Lee.   Understanding of regimen: good Understanding of indications: good Potential of compliance: good Patient understands to avoid NSAIDs. Patient understands to avoid decongestants.  Pertinent Lab Values: 02/26/21: Serum creatinine 2.07, BUN 49, Potassium 4.1, Sodium 140; 03/09/21 Magnesium 2.6  Vital  Signs: Weight: 200.4 lb (last clinic weight: 197.8 lb) Blood pressure: 142/76  Heart rate: 54   Assessment/Plan: 1. Chronic systolic CHF: Echo in 07/4678 with EF 25-30%.  Patient had NSTEMI during 02/2020 admission with COVID-19, not cathed with CKD.  Ischemic cardiomyopathy based on 09/2020 cath. RHC in 09/2020 with preserved cardiac output and low filling pressures.   - NYHA class II, not volume overloaded on exam. Medication titration limited by CKD, orthostatic symptoms, and bradycardia.  - Continue Lasix 20 mg daily.  - Continue metoprolol succinate 50 mg daily.  - He did not tolerate Entresto due to lightheadedness. Unable to add ARB due to CKD.  - Spironolactone 12.5 mg daily was discontinued by nephrology 02/2021 due to worsening renal function.  - Continue Invokana 300 mg daily. Will switch to Farxiga 10 mg daily in April due to formulary change.   - Continue vericiguat 2.5 mg daily, did not tolerate 5 mg daily.  - He says that he would not be interested in ICD.  2. CAD: Cath in 09/2020 with severe 3VD, occluded RCA with collaterals, 99% proximal-mid LAD stenosis, 80% ostial LCx, significant disease in OMs and diagonals.  With low EF and 3VD, I recommended CABG.  He saw Dr. Cyndia Bent, and is adamant that he does not want to have surgery.  Dr. Cyndia Bent wanted him to get an MRI for viability, but if he is not going to have CABG, would not get MRI.  Dr. Aundra Dubin reviewed his cath films with Dr. Burt Knack for PCI options.  Could potentially intervene on LAD, but would essentially be a CTO procedure with increased risk to the patient (including worsening renal function).  Based on recent REVIVED-BCIS trial, it was thought that Mr. Radell would not derive much benefit from PCI in this situation in the absence of significant angina (which he does not have currently).  No chest pain.  - Continue atorvastatin and ezetimibe, good lipids in 12/2020.  - He is not on ASA due to Eliquis use.  3. Atrial fibrillation: DCCV  to NSR in 10/2020.  -  Continue amiodarone 100 mg daily.   -  Have discussed AF ablation, but he is not interested in invasive procedures.   4. CVA: 03/2020, likely due to AF.  5. CKD: stage 3.  Post-transplant.     Patient is willing to take medications but does not want any invasive procedures.    Follow up on 05/18/21 with PA/NP.    Audry Riles, PharmD, BCPS, BCCP, CPP Heart Failure Clinic Pharmacist [phone]

## 2021-03-11 NOTE — Patient Instructions (Signed)
It was a pleasure seeing you today!  MEDICATIONS: -No medication changes today -Call if you have questions about your medications.  NEXT APPOINTMENT: Return to clinic in next on 05/18/21.  In general, to take care of your heart failure: -Limit your fluid intake to 2 Liters (half-gallon) per day.   -Limit your salt intake to ideally 2-3 grams (2000-3000 mg) per day. -Weigh yourself daily and record, and bring that "weight diary" to your next appointment.  (Weight gain of 2-3 pounds in 1 day typically means fluid weight.) -The medications for your heart are to help your heart and help you live longer.   -Please contact us before stopping any of your heart medications.  Call the clinic at [phone] with questions or to reschedule future appointments.

## 2021-04-15 ENCOUNTER — Ambulatory Visit (INDEPENDENT_AMBULATORY_CARE_PROVIDER_SITE_OTHER): Payer: Medicare Other

## 2021-04-15 VITALS — Ht 70.0 in | Wt 195.0 lb

## 2021-04-15 DIAGNOSIS — Z0000 Encounter for general adult medical examination without abnormal findings: Secondary | ICD-10-CM | POA: Diagnosis not present

## 2021-04-15 NOTE — Patient Instructions (Signed)
Daniel Myers , Thank you for taking time to come for your Medicare Wellness Visit. I appreciate your ongoing commitment to your health goals. Please review the following plan we discussed and let me know if I can assist you in the future.   Screening recommendations/referrals: Colonoscopy: Cologuard, repeat in 2024.  Recommended yearly ophthalmology/optometry visit for glaucoma screening and checkup Recommended yearly dental visit for hygiene and checkup  Vaccinations: Influenza vaccine: Due. Repeat annually  Pneumococcal vaccine: Done 01/29/2015 and 05/04/2016 Tdap vaccine: Due. Repeat in 10 years  Shingles vaccine: Discussed.   Covid-19: Declined.  Advanced directives: Please bring a copy of your health care power of attorney and living will to the office to be added to your chart at your convenience.   Conditions/risks identified: KEEP UP THE GOOD WORK!!  Next appointment: Follow up in one year for your annual wellness visit. 2024.  Preventive Care 75 Years and Older, Male  Preventive care refers to lifestyle choices and visits with your health care provider that can promote health and wellness. What does preventive care include? A yearly physical exam. This is also called an annual well check. Dental exams once or twice a year. Routine eye exams. Ask your health care provider how often you should have your eyes checked. Personal lifestyle choices, including: Daily care of your teeth and gums. Regular physical activity. Eating a healthy diet. Avoiding tobacco and drug use. Limiting alcohol use. Practicing safe sex. Taking low doses of aspirin every day. Taking vitamin and mineral supplements as recommended by your health care provider. What happens during an annual well check? The services and screenings done by your health care provider during your annual well check will depend on your age, overall health, lifestyle risk factors, and family history of disease. Counseling   Your health care provider may ask you questions about your: Alcohol use. Tobacco use. Drug use. Emotional well-being. Home and relationship well-being. Sexual activity. Eating habits. History of falls. Memory and ability to understand (cognition). Work and work Statistician. Screening  You may have the following tests or measurements: Height, weight, and BMI. Blood pressure. Lipid and cholesterol levels. These may be checked every 5 years, or more frequently if you are over 29 years old. Skin check. Lung cancer screening. You may have this screening every year starting at age 75 if you have a 30-pack-year history of smoking and currently smoke or have quit within the past 15 years. Fecal occult blood test (FOBT) of the stool. You may have this test every year starting at age 75. Flexible sigmoidoscopy or colonoscopy. You may have a sigmoidoscopy every 5 years or a colonoscopy every 10 years starting at age 75. Prostate cancer screening. Recommendations will vary depending on your family history and other risks. Hepatitis C blood test. Hepatitis B blood test. Sexually transmitted disease (STD) testing. Diabetes screening. This is done by checking your blood sugar (glucose) after you have not eaten for a while (fasting). You may have this done every 1-3 years. Abdominal aortic aneurysm (AAA) screening. You may need this if you are a current or former smoker. Osteoporosis. You may be screened starting at age 75 if you are at high risk. Talk with your health care provider about your test results, treatment options, and if necessary, the need for more tests. Vaccines  Your health care provider may recommend certain vaccines, such as: Influenza vaccine. This is recommended every year. Tetanus, diphtheria, and acellular pertussis (Tdap, Td) vaccine. You may need a Td booster every  10 years. Zoster vaccine. You may need this after age 75. Pneumococcal 13-valent conjugate (PCV13) vaccine.  One dose is recommended after age 75. Pneumococcal polysaccharide (PPSV23) vaccine. One dose is recommended after age 75. Talk to your health care provider about which screenings and vaccines you need and how often you need them. This information is not intended to replace advice given to you by your health care provider. Make sure you discuss any questions you have with your health care provider. Document Released: 02/21/2015 Document Revised: 10/15/2015 Document Reviewed: 11/26/2014 Elsevier Interactive Patient Education  2017 [place] Prevention in the Home Falls can cause injuries. They can happen to people of all ages. There are many things you can do to make your home safe and to help prevent falls. What can I do on the outside of my home? Regularly fix the edges of walkways and driveways and fix any cracks. Remove anything that might make you trip as you walk through a door, such as a raised step or threshold. Trim any bushes or trees on the path to your home. Use bright outdoor lighting. Clear any walking paths of anything that might make someone trip, such as rocks or tools. Regularly check to see if handrails are loose or broken. Make sure that both sides of any steps have handrails. Any raised decks and porches should have guardrails on the edges. Have any leaves, snow, or ice cleared regularly. Use sand or salt on walking paths during winter. Clean up any spills in your garage right away. This includes oil or grease spills. What can I do in the bathroom? Use night lights. Install grab bars by the toilet and in the tub and shower. Do not use towel bars as grab bars. Use non-skid mats or decals in the tub or shower. If you need to sit down in the shower, use a plastic, non-slip stool. Keep the floor dry. Clean up any water that spills on the floor as soon as it happens. Remove soap buildup in the tub or shower regularly. Attach bath mats securely with double-sided  non-slip rug tape. Do not have throw rugs and other things on the floor that can make you trip. What can I do in the bedroom? Use night lights. Make sure that you have a light by your bed that is easy to reach. Do not use any sheets or blankets that are too big for your bed. They should not hang down onto the floor. Have a firm chair that has side arms. You can use this for support while you get dressed. Do not have throw rugs and other things on the floor that can make you trip. What can I do in the kitchen? Clean up any spills right away. Avoid walking on wet floors. Keep items that you use a lot in easy-to-reach places. If you need to reach something above you, use a strong step stool that has a grab bar. Keep electrical cords out of the way. Do not use floor polish or wax that makes floors slippery. If you must use wax, use non-skid floor wax. Do not have throw rugs and other things on the floor that can make you trip. What can I do with my stairs? Do not leave any items on the stairs. Make sure that there are handrails on both sides of the stairs and use them. Fix handrails that are broken or loose. Make sure that handrails are as long as the stairways. Check any carpeting to make  sure that it is firmly attached to the stairs. Fix any carpet that is loose or worn. Avoid having throw rugs at the top or bottom of the stairs. If you do have throw rugs, attach them to the floor with carpet tape. Make sure that you have a light switch at the top of the stairs and the bottom of the stairs. If you do not have them, ask someone to add them for you. What else can I do to help prevent falls? Wear shoes that: Do not have high heels. Have rubber bottoms. Are comfortable and fit you well. Are closed at the toe. Do not wear sandals. If you use a stepladder: Make sure that it is fully opened. Do not climb a closed stepladder. Make sure that both sides of the stepladder are locked into place. Ask  someone to hold it for you, if possible. Clearly mark and make sure that you can see: Any grab bars or handrails. First and last steps. Where the edge of each step is. Use tools that help you move around (mobility aids) if they are needed. These include: Canes. Walkers. Scooters. Crutches. Turn on the lights when you go into a dark area. Replace any light bulbs as soon as they burn out. Set up your furniture so you have a clear path. Avoid moving your furniture around. If any of your floors are uneven, fix them. If there are any pets around you, be aware of where they are. Review your medicines with your doctor. Some medicines can make you feel dizzy. This can increase your chance of falling. Ask your doctor what other things that you can do to help prevent falls. This information is not intended to replace advice given to you by your health care provider. Make sure you discuss any questions you have with your health care provider. Document Released: 11/21/2008 Document Revised: 07/03/2015 Document Reviewed: 03/01/2014 Elsevier Interactive Patient Education  2017 [employer].

## 2021-04-15 NOTE — Progress Notes (Signed)
Subjective:   Daniel Myers is a 76 y.o. male who presents for Medicare Annual/Subsequent preventive examination. Virtual Visit via Telephone Note  I connected with  Daniel Myers on 04/15/21 at  9:00 AM EST by telephone and verified that I am speaking with the correct person using two identifiers.  Location: Patient: HOME Provider: [facility] Persons participating in the virtual visit: patient/Nurse Health Advisor   I discussed the limitations, risks, security and privacy concerns of performing an evaluation and management service by telephone and the availability of in person appointments. The patient expressed understanding and agreed to proceed.  Interactive audio and video telecommunications were attempted between this nurse and patient, however failed, due to patient having technical difficulties OR patient did not have access to video capability.  We continued and completed visit with audio only.  Some vital signs may be absent or patient reported.   Chriss Driver, LPN  Review of Systems     Cardiac Risk Factors include: advanced age (>7mn, >>100women);diabetes mellitus;hypertension;dyslipidemia;male gender;sedentary lifestyle;smoking/ tobacco exposure;Other (see comment), Risk factor comments: s/p kidney transplant, Afib     Objective:    Today's Vitals   04/15/21 0900  Weight: 195 lb (88.5 kg)  Height: '5\' 10"'$  (1.778 m)   Body mass index is 27.98 kg/m.  Advanced Directives 04/15/2021 10/16/2020 09/09/2020 04/14/2020 03/27/2020 03/27/2020 02/12/2020  Does Patient Have a Medical Advance Directive? Yes No No No No No No  Type of Advance Directive [place] Chart? No - copy requested - - - - - -  Would patient like information on creating a medical advance directive? No - Patient declined - No - Patient declined No - Patient declined No - Patient declined No - Patient declined No - Patient declined    Current  Medications (verified) Outpatient Encounter Medications as of 04/15/2021  Medication Sig   acetaminophen (TYLENOL) 500 MG tablet Take 500 mg by mouth every 8 (eight) hours as needed for moderate pain.   albuterol (VENTOLIN HFA) 108 (90 Base) MCG/ACT inhaler Inhale 2 puffs into the lungs every 6 (six) hours as needed for wheezing or shortness of breath.   ALPRAZolam (XANAX) 0.25 MG tablet Take 2 tablets (0.5 mg total) by mouth at bedtime as needed for anxiety.   amiodarone (PACERONE) 200 MG tablet Take 0.5 tablets (100 mg total) by mouth daily.   apixaban (ELIQUIS) 5 MG TABS tablet Take 1 tablet (5 mg total) by mouth 2 (two) times daily.   atorvastatin (LIPITOR) 40 MG tablet Take 1 tablet (40 mg total) by mouth daily.   canagliflozin (INVOKANA) 300 MG TABS tablet Take 300 mg by mouth daily before breakfast.   dapagliflozin propanediol (FARXIGA) 10 MG TABS tablet Take 1 tablet (10 mg total) by mouth daily before breakfast.   ezetimibe (ZETIA) 10 MG tablet Take 1 tablet (10 mg total) by mouth daily.   fluticasone (FLONASE) 50 MCG/ACT nasal spray USE ONE SPRAY(S) IN EACH NOSTRIL ONCE DAILY   furosemide (LASIX) 20 MG tablet Take 1 tablet (20 mg total) by mouth daily.   insulin NPH Human (NOVOLIN N) 100 UNIT/ML injection 10 units AC breakfast and 10 units AC supper (Patient taking differently: 18 units AC breakfast and 8 units AC supper)   insulin regular (NOVOLIN R) 100 units/mL injection Inject 18 Units into the skin 2 (two) times daily. 13 units w bkfst and 13 units w  dinner   loratadine (CLARITIN) 10 MG tablet Take 10 mg by mouth daily as needed for allergies.   metoprolol succinate (TOPROL-XL) 50 MG 24 hr tablet Take 50 mg by mouth daily. Take with or immediately following a meal.   mycophenolate (CELLCEPT) 250 MG capsule Take 500 mg by mouth 2 (two) times daily.   nitroGLYCERIN (NITROSTAT) 0.4 MG SL tablet Place 1 tablet (0.4 mg total) under the tongue every 5 (five) minutes as needed for chest  pain.   ondansetron (ZOFRAN) 4 MG tablet Take 1 tablet (4 mg total) by mouth every 8 (eight) hours as needed for nausea or vomiting.   predniSONE (DELTASONE) 5 MG tablet Take 5 mg by mouth daily with breakfast.   RELION INSULIN SYRINGE 31G X 15/64" 0.5 ML MISC USE AS DIRECTED FOR INSULIN   sodium bicarbonate 650 MG tablet Take 1,300 mg by mouth 2 (two) times daily.   sulfamethoxazole-trimethoprim (BACTRIM) 400-80 MG tablet Take 1 tablet by mouth every Monday, Wednesday, and Friday.   tacrolimus (PROGRAF) 0.5 MG capsule Take 1 mg by mouth in the morning and at bedtime.   Vericiguat (VERQUVO) 2.5 MG TABS Take 2.5 mg by mouth daily.   No facility-administered encounter medications on file as of 04/15/2021.    Allergies (verified) Tape, Trazodone and nefazodone, Mirtazapine, and Elemental sulfur   History: Past Medical History:  Diagnosis Date   Acute respiratory failure with hypoxia ([place]) 02/12/2020   AKI (acute kidney injury) ([place]) 02/13/2020   Atrial fibrillation with RVR ([place]) 03/11/2020   Basal cell carcinoma 06/27/2013   nodular on left jawline - excision   Basal cell carcinoma 07/01/2014   nodular on left haw[place] - CX3+5FU+excision   Basal cell carcinoma 10/10/2014   nodular on right forearm, middle - tx p bx   Basal cell carcinoma 02/11/2015   left neck - CX3 + excision   Basal cell carcinoma 06/14/2016   left jawline - CX3+5FU   Basal cell carcinoma 02/22/2017   superficial and nodular on left neck - excision   Basal cell carcinoma 04/11/2018   superficial and nodular on right inferior forearm - CX3+Cautery+5FU   Chronic kidney disease    History of renal transplant    Hyperlipidemia    Hypertension    NSTEMI (non-ST elevated myocardial infarction) ([place]) 02/13/2020   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   in situ on left arm - CX3+5FU   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   hypertrophic on left ear - CX3+5FU   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    in situ on right temple - CX3+5FU   Squamous cell carcinoma of skin 11/08/2011   right upper forearm - tx p bx   Squamous cell carcinoma of skin 11/08/2011   left upper forearm - tx p bx   Squamous cell carcinoma of skin 11/08/2011   left hand - tx p bx   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/27/2013   in situ on left lower back - CX3+5FU   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/27/2013   in situ on left forehead - CX3+5FU   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/27/2013   in situ on right temple - watch per ST   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/27/2013   in situ on right forearm - CX3+5FU   Squamous cell carcinoma of skin 07/01/2014   well differentiated on right sideburn - CX3+5FU   Squamous cell carcinoma of skin 07/01/2014   in situ on left shoulder -  CX3+5FU   Squamous cell carcinoma of skin 07/01/2014   in situ on right forearm, proximal - CX3+5FU+Cautery   Squamous cell carcinoma of skin 07/01/2014   in situ on right forearm, distal - CX3+5FU   Squamous cell carcinoma of skin 09/30/2015   in situ on posterior left ear - CX3+5FU   Squamous cell carcinoma of skin 02/22/2017   in situ on right upper arm - CX3+5FU   Squamous cell carcinoma of skin 02/22/2017   in situ on left upper arm - CX3+5FU   Squamous cell carcinoma of skin 04/11/2018   in situ on right temple - CX3+5FU   Squamous cell carcinoma of skin 04/11/2018   in situ on lateral right arm - MOHs   Squamous cell carcinoma of skin 04/11/2018   in situ on right upper arm - MOHs   Squamous cell carcinoma of skin 04/11/2018   in situ on left sideburn   Squamous cell carcinoma of skin 04/11/2018   in situ on right flank - tx p bx   Squamous cell carcinoma of skin 09/28/2018   in situ on right inner ear - tx p bx   Squamous cell carcinoma of skin 09/28/2018   in situ on left inner ear - tx p bx   Squamous cell carcinoma of skin 09/28/2018   in situ on right arm - tx p bx   Squamous cell carcinoma of skin 03/21/2019   in  situ on right antihelix ([place] treated topically)   Squamous cell carcinoma of skin 03/21/2019   in situ on right neck - CX3+5FU   Squamous cell carcinoma of skin 03/21/2019   in situ on left outer eye, inf (MOHs done 05/23/2019)   Type 2 diabetes mellitus ([place])    Unspecified atrial fibrillation ([place]) 02/17/2020   Past Surgical History:  Procedure Laterality Date   CARDIOVERSION N/A 10/16/2020   Procedure: CARDIOVERSION;  Surgeon: Larey Dresser, MD;  Location: [facility] ENDOSCOPY;  Service: Cardiovascular;  Laterality: N/A;   KIDNEY TRANSPLANT Right    RIGHT/LEFT HEART CATH AND CORONARY ANGIOGRAPHY N/A 09/09/2020   Procedure: RIGHT/LEFT HEART CATH AND CORONARY ANGIOGRAPHY;  Surgeon: Larey Dresser, MD;  Location: [facility] CV LAB;  Service: Cardiovascular;  Laterality: N/A;   Family History  Problem Relation Age of Onset   Clotting disorder Mother    Hypertension Sister    Diabetes Sister    Social History   Socioeconomic History   Marital status: Widowed    Spouse name: Not on file   Number of children: 3   Years of education: Not on file   Highest education level: Not on file  Occupational History   Not on file  Tobacco Use   Smoking status: Some Days    Types: Pipe   Smokeless tobacco: Never  Vaping Use   Vaping Use: Never used  Substance and Sexual Activity   Alcohol use: Never   Drug use: Never   Sexual activity: Not Currently  Other Topics Concern   Not on file  Social History Narrative   Wife passed away in 2007-05-18.   2 daughters, 1 son. All live close by.    3 grandchildren.    Social Determinants of Health   Financial Resource Strain: Low Risk    Difficulty of Paying Living Expenses: Not hard at all  Food Insecurity: No Food Insecurity   Worried About Charity fundraiser in the Last Year: Never true   [place] in the  Last Year: Never true  Transportation Needs: No Transportation Needs   Lack of Transportation (Medical): No   Lack of Transportation  (Non-Medical): No  Physical Activity: Insufficiently Active   Days of Exercise per Week: 7 days   Minutes of Exercise per Session: 20 min  Stress: No Stress Concern Present   Feeling of Stress : Not at all  Social Connections: Moderately Integrated   Frequency of Communication with Friends and Family: More than three times a week   Frequency of Social Gatherings with Friends and Family: More than three times a week   Attends Religious Services: 1 to 4 times per year   Active Member of [employer] or Organizations: No   Attends Archivist Meetings: 1 to 4 times per year   Marital Status: Widowed    Tobacco Counseling Ready to quit: Not Answered Counseling given: Not Answered   Clinical Intake:  Pre-visit preparation completed: Yes  Pain : No/denies pain     BMI - recorded: 27.98 Nutritional Status: BMI 25 -29 Overweight Nutritional Risks: None Diabetes: Yes  How often do you need to have someone help you when you read instructions, pamphlets, or other written materials from your doctor or pharmacy?: 1 - Never  Diabetic?Nutrition Risk Assessment:  Has the patient had any N/V/D within the last 2 months?  No  Does the patient have any non-healing wounds?  No  Has the patient had any unintentional weight loss or weight gain?  No   Diabetes:  Is the patient diabetic?  Yes  If diabetic, was a CBG obtained today?  No  Did the patient bring in their glucometer from home?  No  Phone visit. How often do you monitor your CBG's? Daily.   Financial Strains and Diabetes Management:  Are you having any financial strains with the device, your supplies or your medication? No .  Does the patient want to be seen by Chronic Care Management for management of their diabetes?  No  Would the patient like to be referred to a Nutritionist or for Diabetic Management?  No   Diabetic Exams:  Diabetic Eye Exam: Completed 2021. Overdue for diabetic eye exam. Pt has been advised about the  importance in completing this exam.  Diabetic Foot Exam: Completed 09/17/2020. Pt has been advised about the importance in completing this exam.   Interpreter Needed?: No  Information entered by :: mj Ebrima Ranta, lpn   Activities of Daily Living In your present state of health, do you have any difficulty performing the following activities: 04/15/2021  Hearing? N  Vision? N  Difficulty concentrating or making decisions? Y  Comment At times per times.  Walking or climbing stairs? N  Dressing or bathing? N  Doing errands, shopping? N  Preparing Food and eating ? N  Using the Toilet? N  In the past six months, have you accidently leaked urine? N  Do you have problems with loss of bowel control? N  Managing your Medications? N  Managing your Finances? N  Housekeeping or managing your Housekeeping? N  Some recent data might be hidden    Patient Care Team: Claretta Fraise, MD as PCP - General (Family Medicine) Satira Sark, MD as PCP - Cardiology (Cardiology) Lavonna Monarch, MD as Consulting Physician (Dermatology) Ilean China, RN as Case Manager Shea Evans [name], LCSW as [place] (Licensed Clinical Social Worker)  Indicate any recent [place] you may have received from other than [facility] providers in the past  year (date may be approximate).     Assessment:   This is a routine wellness examination for [place].  Hearing/Vision screen Hearing Screening - Comments:: No hearing issues.   Vision Screening - Comments:: Glasses. Dr. Natale Milch in [place]. 2021.  Dietary issues and exercise activities discussed: Current Exercise Habits: Home exercise routine, Type of exercise: Other - see comments (PT exercises.), Time (Minutes): 20, Frequency (Times/Week): 7, Weekly Exercise (Minutes/Week): 140, Intensity: Mild, Exercise limited by: cardiac condition(s)   Goals Addressed             This Visit's Progress    Improve My Quality of  Life   On track    Timeframe:  Short-Term Goal Priority:  High Start Date: 03/12/20                            Expected End Date: 02/07/21                 Follow Up Date 09/16/20   Continue to work on balance and strength Use walker as needed for ambulation Talk or visit with friends/family daily Get outside once a day Work with Consulting civil engineer to discuss ways to self-manage medical conditions [phone] Work with LCSW to discuss coping mechanisms and stress management [phone] Reach out to PCP with any new or worsening problems [phone] Eat meals at regular intervals in order to maintain blood sugar levels and strength   Why is this important?   Having a long-term illness can be scary.  It can also be stressful for you and your caregiver.  These steps may help.    Notes:        Depression Screen PHQ 2/9 Scores 04/15/2021 01/28/2021 10/29/2020 09/17/2020 08/05/2020 06/24/2020 06/24/2020  PHQ - 2 Score 0 0 0 0 0 0 0  PHQ- 9 Score - - - - - 0 -    Fall Risk Fall Risk  04/15/2021 01/28/2021 01/28/2021 10/29/2020 09/17/2020  Falls in the past year? 1 1 0 - 1  Number falls in past yr: 1 1 - 1 1  Injury with Fall? 0 0 - 1 1  Risk for fall due to : Impaired balance/gait;Impaired mobility;History of fall(s) History of fall(s) - History of fall(s) History of fall(s)  Follow up Falls prevention discussed Falls evaluation completed - Falls evaluation completed Falls evaluation completed    [place]:  Any stairs in or around the home? Yes  If so, are there any without handrails? Yes  Home free of loose throw rugs in walkways, pet beds, electrical cords, etc? Yes  Adequate lighting in your home to reduce risk of falls? Yes   ASSISTIVE DEVICES UTILIZED TO PREVENT FALLS:  Life alert? No  Use of a cane, walker or w/c? Yes  Grab bars in the bathroom? Yes  Shower chair or bench in shower? Yes  Elevated toilet seat or a handicapped toilet? No   TIMED  UP AND GO:  Was the test performed? No .  Phone visit.  Cognitive Function:     6CIT Screen 04/15/2021 04/14/2020  What Year? 0 points 0 points  What month? 0 points 0 points  What time? 0 points 0 points  Count back from 20 0 points 0 points  Months in reverse 2 points 4 points  Repeat phrase 0 points 0 points  Total Score 2 4    Immunizations Immunization History  Administered Date(s) Administered  Fluad Quad(high Dose 65+) 12/26/2013, 12/29/2016, 12/28/2017, 11/22/2018   Influenza Split 11/16/2010   Influenza, High Dose Seasonal PF 12/26/2013, 11/26/2014, 01/05/2016, 12/29/2016, 12/28/2017, 05/03/2019   Influenza, Seasonal, Injecte, Preservative Fre 12/09/2012   Influenza,inj,Quad PF,6+ Mos 11/16/2010   Influenza-Unspecified 12/09/2012   Pneumococcal Conjugate-13 01/29/2015   Pneumococcal Polysaccharide-23 09/09/2007, 05/04/2016    TDAP status: Due, Education has been provided regarding the importance of this vaccine. Advised may receive this vaccine at local pharmacy or Health Dept. Aware to provide a copy of the vaccination record if obtained from local pharmacy or Health Dept. Verbalized acceptance and understanding.  Flu Vaccine status: Due, Education has been provided regarding the importance of this vaccine. Advised may receive this vaccine at local pharmacy or Health Dept. Aware to provide a copy of the vaccination record if obtained from local pharmacy or Health Dept. Verbalized acceptance and understanding.  Pneumococcal vaccine status: Up to date  Covid-19 vaccine status: Declined, Education has been provided regarding the importance of this vaccine but patient still declined. Advised may receive this vaccine at local pharmacy or Health Dept.or vaccine clinic. Aware to provide a copy of the vaccination record if obtained from local pharmacy or Health Dept. Verbalized acceptance and understanding.  Qualifies for Shingles Vaccine? No   Zostavax completed  Pt unable to  receive Shingrix vaccine due to anti-rejection medications.   Shingrix Completed?: No.    Education has been provided regarding the importance of this vaccine. Patient has been advised to call insurance company to determine out of pocket expense if they have not yet received this vaccine. Advised may also receive vaccine at local pharmacy or Health Dept. Verbalized acceptance and understanding.  Screening Tests Health Maintenance  Topic Date Due   COVID-19 Vaccine (1) Never done   OPHTHALMOLOGY EXAM  Never done   COLONOSCOPY (Pts 45-23yr Insurance coverage will need to be confirmed)  Never done   URINE MICROALBUMIN  03/13/2020   Zoster Vaccines- Shingrix (1 of 2) 04/28/2021 (Originally 05/24/1964)   INFLUENZA VACCINE  05/08/2021 (Originally 09/08/2020)   TETANUS/TDAP  09/17/2021 (Originally 05/24/1964)   HEMOGLOBIN A1C  05/05/2021   FOOT EXAM  09/17/2021   Pneumonia Vaccine 75 Years old  Completed   Hepatitis C Screening  Completed   HPV VACCINES  Aged Out    Health Maintenance  Health Maintenance Due  Topic Date Due   COVID-19 Vaccine (1) Never done   OPHTHALMOLOGY EXAM  Never done   COLONOSCOPY (Pts 45-427yrInsurance coverage will need to be confirmed)  Never done   URINE MICROALBUMIN  03/13/2020    Colorectal cancer screening: Type of screening: Cologuard. Completed 2021 per pt. Repeat every 3 years  Lung Cancer Screening: (Low Dose CT Chest recommended if Age 75-80ears, 30 pack-year currently smoking OR have quit w/in 15years.) does qualify.   Lung Cancer Screening Referral: Chest CT w/o contrast done 05/13/2020  Additional Screening:  Hepatitis C Screening: does qualify; Completed 06/25/2016  Vision Screening: Recommended annual ophthalmology exams for early detection of glaucoma and other disorders of the eye. Is the patient up to date with their annual eye exam?  No  Who is the provider or what is the name of the office in which the patient attends annual eye exams? Dr.  ChNatale Milchn [place] pt is not established with a provider, would they like to be referred to a provider to establish care? No .   Dental Screening: Recommended annual dental exams for proper oral hygiene  Community Resource Referral /  Chronic Care Management: CRR required this visit?  No   CCM required this visit?  No      Plan:     I have personally reviewed and noted the following in the patients chart:   Medical and social history Use of alcohol, tobacco or illicit drugs  Current medications and supplements including opioid prescriptions. Patient is not currently taking opioid prescriptions. Functional ability and status Nutritional status Physical activity Advanced directives List of other physicians Hospitalizations, surgeries, and ER visits in previous 12 months Vitals Screenings to include cognitive, depression, and falls Referrals and appointments  In addition, I have reviewed and discussed with patient certain preventive protocols, quality metrics, and best practice recommendations. A written personalized care plan for preventive services as well as general preventive health recommendations were provided to patient.     Chriss Driver, LPN   10/15/7891   Nurse Notes: Pt states he did Cologuard in 2021 but I am unable to find documentation in chart. Advised patient he would need a repeat in 2024. Discussed Shingrix, pt unable to have due to being on anti-rejection medication, s/p kidney transplant.

## 2021-04-30 ENCOUNTER — Encounter: Payer: Self-pay | Admitting: Family Medicine

## 2021-04-30 ENCOUNTER — Ambulatory Visit (INDEPENDENT_AMBULATORY_CARE_PROVIDER_SITE_OTHER): Payer: Medicare Other | Admitting: Family Medicine

## 2021-04-30 VITALS — BP 127/53 | HR 53 | Temp 97.5°F | Ht 70.0 in | Wt 202.8 lb

## 2021-04-30 DIAGNOSIS — Z794 Long term (current) use of insulin: Secondary | ICD-10-CM

## 2021-04-30 DIAGNOSIS — E119 Type 2 diabetes mellitus without complications: Secondary | ICD-10-CM

## 2021-04-30 DIAGNOSIS — I10 Essential (primary) hypertension: Secondary | ICD-10-CM | POA: Diagnosis not present

## 2021-04-30 DIAGNOSIS — E782 Mixed hyperlipidemia: Secondary | ICD-10-CM | POA: Diagnosis not present

## 2021-04-30 DIAGNOSIS — G4700 Insomnia, unspecified: Secondary | ICD-10-CM | POA: Diagnosis not present

## 2021-04-30 LAB — BAYER DCA HB A1C WAIVED: HB A1C (BAYER DCA - WAIVED): 7.7 % — ABNORMAL HIGH (ref 4.8–5.6)

## 2021-04-30 MED ORDER — ALPRAZOLAM 0.25 MG PO TABS: 0.2500 mg | ORAL_TABLET | Freq: Every evening | ORAL | 1 refills | Status: DC | PRN

## 2021-04-30 MED ORDER — INSULIN REGULAR HUMAN 100 UNIT/ML IJ SOLN: INTRAMUSCULAR | 5 refills | Status: DC

## 2021-04-30 NOTE — Progress Notes (Addendum)
Subjective:  Patient ID: Daniel Myers,  male    DOB: [birth year]  Age: 76 y.o.    CC: Medical Management of Chronic Issues   HPI Daniel Myers presents for  follow-up of hypertension. Patient has no history of headache chest pain or shortness of breath or recent cough. Patient also denies symptoms of TIA such as numbness weakness lateralizing. Patient denies side effects from medication. States taking it regularly.  Patient also  in for follow-up of elevated cholesterol. Doing well without complaints on current medication. Denies side effects  including myalgia and arthralgia and nausea. Also in today for liver function testing. Currently no chest pain, shortness of breath or other cardiovascular related symptoms noted.  Follow-up of diabetes. Patient does check blood sugar at home. Readings run between 100-120 and 220-250 Patient denies symptoms such as excessive hunger or urinary frequency, excessive hunger, nausea No significant hypoglycemic spells noted. Medications reviewed. Pt reports taking them regularly. Pt. denies complication/adverse reaction today. Pt. Is insulin dependent, using 16 units with breakfast and 13 with dinner. Since his insurance won't cover as preferred  any insulin developed in this century, he has to use Novolin N 10 units BID. This Makes his glucose much more likely to fluctuate requiring more frequent testing. He is dependent on prednisone due to renal transplant. This makes his glucose more unpredictable as well. As a result, He has been advised to test before each meal and at bedtime. A sliding scale may need to be instituted. HE also should test PRN for symptom referable to hypoglycemia.   History Daniel Myers has a past medical history of Acute respiratory failure with hypoxia (HCC) (02/12/2020), AKI (acute kidney injury) (HCC) (02/13/2020), Atrial fibrillation with RVR (HCC) (03/11/2020), Basal cell carcinoma (06/27/2013), Basal cell carcinoma (07/01/2014), Basal  cell carcinoma (10/10/2014), Basal cell carcinoma (02/11/2015), Basal cell carcinoma (06/14/2016), Basal cell carcinoma (02/22/2017), Basal cell carcinoma (04/11/2018), Chronic kidney disease, History of renal transplant, Hyperlipidemia, Hypertension, NSTEMI (non-ST elevated myocardial infarction) (HCC) (02/13/2020), 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), 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), 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), Squamous cell carcinoma of skin (11/08/2011), Squamous cell carcinoma of skin (11/08/2011), Squamous cell carcinoma of skin (11/08/2011), 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), 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), 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), 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), 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), 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), 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), 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), Squamous cell carcinoma of skin (09/30/2015), Squamous cell carcinoma of skin (02/22/2017), Squamous cell carcinoma of skin (02/22/2017), Squamous cell carcinoma of skin (04/11/2018), Squamous cell carcinoma of skin (04/11/2018), Squamous cell carcinoma of skin (04/11/2018), Squamous cell carcinoma of skin (04/11/2018), Squamous cell carcinoma of skin (04/11/2018), Squamous cell carcinoma of skin (09/28/2018), Squamous cell carcinoma of skin (09/28/2018), Squamous cell carcinoma of skin (09/28/2018), Squamous cell carcinoma of skin (03/21/2019), Squamous cell carcinoma of skin (03/21/2019), Squamous cell carcinoma of skin (03/21/2019), Type 2 diabetes mellitus (HCC), and Unspecified atrial fibrillation (HCC) (02/17/2020).   He has a past surgical history that includes Kidney transplant (Right); RIGHT/LEFT HEART CATH AND CORONARY ANGIOGRAPHY (N/A, 09/09/2020); and Cardioversion (N/A, 10/16/2020).   His family history includes Clotting disorder in his mother; Diabetes in  his sister; Hypertension in his sister.He reports that he has been smoking pipe. He has never used smokeless tobacco. He reports that he does not drink alcohol and does not use drugs.  Current Outpatient Medications on File Prior  to Visit  Medication Sig Dispense Refill   acetaminophen (TYLENOL) 500 MG tablet Take 500 mg by mouth every 8 (eight) hours as needed for moderate pain.     albuterol (VENTOLIN HFA) 108 (90 Base) MCG/ACT inhaler Inhale 2 puffs into the lungs every 6 (six) hours as needed for wheezing or shortness of breath. 1 each 3   amiodarone (PACERONE) 200 MG tablet Take 0.5 tablets (100 mg total) by mouth daily. 30 tablet 11   apixaban (ELIQUIS) 5 MG TABS tablet Take 1 tablet (5 mg total) by mouth 2 (two) times daily. 180 tablet 3   atorvastatin (LIPITOR) 40 MG tablet Take 1 tablet (40 mg total) by mouth daily. 90 tablet 3   canagliflozin (INVOKANA) 300 MG TABS tablet Take 300 mg by mouth daily before breakfast.     dapagliflozin propanediol (FARXIGA) 10 MG TABS tablet Take 1 tablet (10 mg total) by mouth daily before breakfast. 90 tablet 3   ezetimibe (ZETIA) 10 MG tablet Take 1 tablet (10 mg total) by mouth daily. 30 tablet 11   fluticasone (FLONASE) 50 MCG/ACT nasal spray USE ONE SPRAY(S) IN EACH NOSTRIL ONCE DAILY 16 g 11   furosemide (LASIX) 20 MG tablet Take 1 tablet (20 mg total) by mouth daily. 90 tablet 3   insulin NPH Human (NOVOLIN N) 100 UNIT/ML injection 10 units AC breakfast and 10 units AC supper (Patient taking differently: 18 units AC breakfast and 8 units AC supper) 30 mL 11   loratadine (CLARITIN) 10 MG tablet Take 10 mg by mouth daily as needed for allergies.     metoprolol succinate (TOPROL-XL) 50 MG 24 hr tablet Take 50 mg by mouth daily. Take with or immediately following a meal.     mycophenolate (CELLCEPT) 250 MG capsule Take 500 mg by mouth 2 (two) times daily.     nitroGLYCERIN (NITROSTAT) 0.4 MG SL tablet Place 1 tablet (0.4 mg total) under the tongue every  5 (five) minutes as needed for chest pain. 25 tablet 3   ondansetron (ZOFRAN) 4 MG tablet Take 1 tablet (4 mg total) by mouth every 8 (eight) hours as needed for nausea or vomiting. 20 tablet 0   predniSONE (DELTASONE) 5 MG tablet Take 5 mg by mouth daily with breakfast.     RELION INSULIN SYRINGE 31G X 15/64" 0.5 ML MISC USE AS DIRECTED FOR INSULIN     sodium bicarbonate 650 MG tablet Take 1,300 mg by mouth 2 (two) times daily.     sulfamethoxazole-trimethoprim (BACTRIM) 400-80 MG tablet Take 1 tablet by mouth every Monday, Wednesday, and Friday.     tacrolimus (PROGRAF) 0.5 MG capsule Take 1 mg by mouth in the morning and at bedtime.     Vericiguat (VERQUVO) 2.5 MG TABS Take 2.5 mg by mouth daily.     No current facility-administered medications on file prior to visit.    ROS Review of Systems  Constitutional:  Negative for fever.  Respiratory:  Negative for shortness of breath.   Cardiovascular:  Negative for chest pain.  Musculoskeletal:  Negative for arthralgias.  Skin:  Negative for rash.   Objective:  BP (!) 127/53   Pulse (!) 53   Temp (!) 97.5 F (36.4 C)   Ht 5\' 10"  (1.778 m)   Wt 202 lb 12.8 oz (92 kg)   SpO2 97%   BMI 29.10 kg/m   BP Readings from Last 3 Encounters:  04/30/21 (!) 127/53  03/11/21 (!) 142/76  02/16/21 130/80  Wt Readings from Last 3 Encounters:  04/30/21 202 lb 12.8 oz (92 kg)  04/15/21 195 lb (88.5 kg)  03/11/21 200 lb 6.4 oz (90.9 kg)     Physical Exam Constitutional:      General: He is not in acute distress.    Appearance: He is well-developed.  HENT:     Head: Normocephalic and atraumatic.     Right Ear: External ear normal.     Left Ear: External ear normal.     Nose: Nose normal.  Eyes:     Conjunctiva/sclera: Conjunctivae normal.     Pupils: Pupils are equal, round, and reactive to light.  Cardiovascular:     Rate and Rhythm: Normal rate and regular rhythm.     Heart sounds: Normal heart sounds. No murmur  heard. Pulmonary:     Effort: Pulmonary effort is normal. No respiratory distress.     Breath sounds: Normal breath sounds. No wheezing or rales.  Abdominal:     Palpations: Abdomen is soft.     Tenderness: There is no abdominal tenderness.  Musculoskeletal:        General: Normal range of motion.     Cervical back: Normal range of motion and neck supple.  Skin:    General: Skin is warm and dry.  Neurological:     Mental Status: He is alert and oriented to person, place, and time.     Deep Tendon Reflexes: Reflexes are normal and symmetric.  Psychiatric:        Behavior: Behavior normal.        Thought Content: Thought content normal.        Judgment: Judgment normal.    Diabetic Foot Exam - Simple   No data filed       Assessment & Plan:   Daniel Myers was seen today for medical management of chronic issues.  Diagnoses and all orders for this visit:  Essential hypertension -     CBC with Differential/Platelet -     CMP14+EGFR  Mixed hyperlipidemia -     Lipid panel  Insulin dependent type 2 diabetes mellitus (HCC) -     Bayer DCA Hb A1c Waived -     Microalbumin / creatinine urine ratio  Insomnia, unspecified type -     ALPRAZolam (XANAX) 0.25 MG tablet; Take 1 tablet (0.25 mg total) by mouth at bedtime as needed for anxiety.  Other orders -     insulin regular (NOVOLIN R) 100 units/mL injection; 16 units w bkfst and 13 units w dinner   I have changed Daniel Myers's ALPRAZolam and insulin regular. I am also having him maintain his tacrolimus, sodium bicarbonate, mycophenolate, ReliOn Insulin Syringe, loratadine, albuterol, predniSONE, ondansetron, fluticasone, insulin NPH Human, ezetimibe, acetaminophen, metoprolol succinate, amiodarone, nitroGLYCERIN, apixaban, sulfamethoxazole-trimethoprim, Verquvo, atorvastatin, furosemide, dapagliflozin propanediol, and canagliflozin.  Meds ordered this encounter  Medications   ALPRAZolam (XANAX) 0.25 MG tablet    Sig: Take  1 tablet (0.25 mg total) by mouth at bedtime as needed for anxiety.    Dispense:  90 tablet    Refill:  1   insulin regular (NOVOLIN R) 100 units/mL injection    Sig: 16 units w bkfst and 13 units w dinner    Dispense:  10 mL    Refill:  5     Follow-up: Return in about 3 months (around 07/31/2021).  Mechele Claude, M.D.

## 2021-04-30 NOTE — Patient Instructions (Signed)
Use Pepcid AC 1/2 hour before eating spicy food to prevent hearburn ?

## 2021-05-01 LAB — CBC WITH DIFFERENTIAL/PLATELET
Basophils Absolute: 0 10*3/uL (ref 0.0–0.2)
Basos: 1 %
EOS (ABSOLUTE): 0.1 10*3/uL (ref 0.0–0.4)
Eos: 2 %
Hematocrit: 42 % (ref 37.5–51.0)
Hemoglobin: 13.5 g/dL (ref 13.0–17.7)
Immature Grans (Abs): 0.3 10*3/uL — ABNORMAL HIGH (ref 0.0–0.1)
Immature Granulocytes: 6 %
Lymphocytes Absolute: 1 10*3/uL (ref 0.7–3.1)
Lymphs: 19 %
MCH: 28.7 pg (ref 26.6–33.0)
MCHC: 32.1 g/dL (ref 31.5–35.7)
MCV: 89 fL (ref 79–97)
Monocytes Absolute: 0.4 10*3/uL (ref 0.1–0.9)
Monocytes: 6 %
Neutrophils Absolute: 3.6 10*3/uL (ref 1.4–7.0)
Neutrophils: 66 %
Platelets: 126 10*3/uL — ABNORMAL LOW (ref 150–450)
RBC: 4.7 x10E6/uL (ref 4.14–5.80)
RDW: 12.7 % (ref 11.6–15.4)
WBC: 5.5 10*3/uL (ref 3.4–10.8)

## 2021-05-01 LAB — CMP14+EGFR
ALT: 13 IU/L (ref 0–44)
AST: 18 IU/L (ref 0–40)
Albumin/Globulin Ratio: 2.1 (ref 1.2–2.2)
Albumin: 4.1 g/dL (ref 3.7–4.7)
Alkaline Phosphatase: 66 IU/L (ref 44–121)
BUN/Creatinine Ratio: 18 (ref 10–24)
BUN: 42 mg/dL — ABNORMAL HIGH (ref 8–27)
Bilirubin Total: 0.6 mg/dL (ref 0.0–1.2)
CO2: 25 mmol/L (ref 20–29)
Calcium: 8.7 mg/dL (ref 8.6–10.2)
Chloride: 97 mmol/L (ref 96–106)
Creatinine, Ser: 2.34 mg/dL — ABNORMAL HIGH (ref 0.76–1.27)
Globulin, Total: 2 g/dL (ref 1.5–4.5)
Glucose: 270 mg/dL — ABNORMAL HIGH (ref 70–99)
Potassium: 4.7 mmol/L (ref 3.5–5.2)
Sodium: 141 mmol/L (ref 134–144)
Total Protein: 6.1 g/dL (ref 6.0–8.5)
eGFR: 28 mL/min/{1.73_m2} — ABNORMAL LOW (ref >59)

## 2021-05-01 LAB — LIPID PANEL
Chol/HDL Ratio: 3.1 ratio (ref 0.0–5.0)
Cholesterol, Total: 106 mg/dL (ref 100–199)
HDL: 34 mg/dL — ABNORMAL LOW (ref >39)
LDL Chol Calc (NIH): 54 mg/dL (ref 0–99)
Triglycerides: 92 mg/dL (ref 0–149)
VLDL Cholesterol Cal: 18 mg/dL (ref 5–40)

## 2021-05-01 LAB — MICROALBUMIN / CREATININE URINE RATIO
Creatinine, Urine: 18.9 mg/dL
Microalb/Creat Ratio: 133 mg/g creat — ABNORMAL HIGH (ref 0–29)
Microalbumin, Urine: 25.2 ug/mL

## 2021-05-04 ENCOUNTER — Other Ambulatory Visit: Payer: Self-pay | Admitting: Family Medicine

## 2021-05-04 MED ORDER — LISINOPRIL 5 MG PO TABS: 5.0000 mg | ORAL_TABLET | Freq: Every day | ORAL | 1 refills | Status: DC

## 2021-05-13 ENCOUNTER — Telehealth: Payer: Self-pay | Admitting: Family Medicine

## 2021-05-13 NOTE — Telephone Encounter (Signed)
[employer] calling--denial on dapagliflozin propanediol (FARXIGA) 10 MG TABS tablet. Will be sending over a fax.  ?

## 2021-05-18 ENCOUNTER — Encounter (HOSPITAL_COMMUNITY): Payer: Medicare Other

## 2021-05-20 ENCOUNTER — Other Ambulatory Visit: Payer: Self-pay | Admitting: Family Medicine

## 2021-05-20 DIAGNOSIS — E782 Mixed hyperlipidemia: Secondary | ICD-10-CM

## 2021-06-08 NOTE — Progress Notes (Addendum)
PCP: Claretta Fraise, MD ?Nephrology: Dr. Joseph Berkshire ?Cardiology: Dr. Domenic Polite ?HF Cardiology: Dr. Aundra Dubin ? ?76 y.o. with history of suspected CAD/NSTEMI, CKD stage 3 s/p renal transplant, chronic systolic CHF, and persistent atrial fibrillation was referred by Dr. Domenic Polite for evaluation of CHF, atrial fibrillation, and suspected CAD.  Patient was doing fairly well until 1/22.  He developed COVID-19 PNA.  Hospitalization was complicated by NSTEMI and the onset of atrial fibrillation.  Cath was not done with elevated creatinine.  Echo in 1/22 showed EF 40-45% with wall motion abnormalities.  Cardiolite done as an outpatient in 3/22 showed EF 21%, inferior and inferolateral fixed defect.   ? ?Patient had CVA in 2/22, likely related to atrial fibrillation.  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  He did PT.  ? ?Repeat echo in 4/22 showed EF down further to 25-30%.  RHC/LHC was done in 8/22.  This showed severe 3VD with occluded RCA with collaterals, 99% proximal-mid LAD stenosis, 80% ostial LCx, significant disease in OMs and diagonals.  RHC showed normal filling pressures and preserved cardiac output.  ? ?He saw Dr. Cyndia Bent and decided that he did not want to undergo CABG.  Dr. Aundra Dubin reviewed the cath films with Dr. Burt Knack. Could potentially intervene on LAD, but would essentially be a CTO procedure with increased risk to the patient (including worsening renal function).  Based on recently released REVIVED-BCIS trial, do not think that Mr. Vandevander would derive much benefit from PCI in this situation in the absence of significant angina (which he does not seem to have).  ? ?In 9/22, he had DCCV back to NSR.  HR in 40s at rest.  ? ?Follow up 1/23, he stopped Entresto due to light headedness. Unable to start low dose ARB due to elevated renal function. Nephrology stopped spironolactone 1/23. PCP started lisinopril 3/23. ? ?Today he returns for HF follow up with his daughter. Overall feeling fine. He has mild  dyspnea when he pushes himself doing housework. Denies palpitations, CP, dizziness, edema, or PND/Orthopnea. Appetite ok. No fever or chills. Weight at home 205-210 pounds. Taking all medications. ? ?ECG (personally reviewed): SB 50 bpm ? ?REDs: 35% ? ?Labs (6/22): LDL 92, HDL 40, K 5.1, creatinine 1.7 ?Labs (8/22): K 5.4, creatinine 2.16, LFTs normal, TSH normal, plts 123, hgb 15.2 ?Labs (9/22): K 4.7, creatinine 2.11 ?Labs (11/22): K 4.9, creatinine 2.34, LDL 52, TGs 128, BNP 573 ?Labs (3/23): K 4.7, creatinine 2.34, LDL 54, HDL 34 ? ?PMH: ?1. Type 2 diabetes ?2. HTN ?3. Hyperlipidemia ?4. H/o COVID-19 PNA in 1/22.  ?5. CKD: Stage 3.  History of renal transplant.   ?6. 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  ?7. Thrombocytopenia ?8. Skin cancer ?9. OSA ?10. CAD: NSTEMI in 1/22 during admission for COVID PNA.  HS-TnI to 8647.  Not cathed with elevated creatinine.  ?- Cardiolite (3/22): EF 21%, inferior and inferolateral fixed defect, apical fixed defect.  ?- LHC (8/22): Severe 3VD with occluded RCA with collaterals, 99% proximal-mid LAD stenosis, 80% ostial LCx, significant disease in OMs and diagonals. ?11. Chronic systolic CHF: Ischemic cardiomyopathy.  ?- Echo (1/22): EF 40-45%, wall motion abnormalities.  ?- Echo (4/22): EF 25-30%, global hypokinesis.  ?- RHC/LHC (8/22): severe 3VD with occluded RCA with collaterals, 99% proximal-mid LAD stenosis, 80% ostial LCx, significant disease in OMs and diagonals. Mean RA 2, PA 21/7, mean PCWP 11, CI 2.2 Fick/2.33 thermodilution ?12. Atrial fibrillation: Persistent since 1/22. DCCV to NSR  in 9/22.  ? ?Social History  ? ?Socioeconomic History  ? Marital status: Widowed  ?  Spouse name: Not on file  ? Number of children: 3  ? Years of education: Not on file  ? Highest education level: Not on file  ?Occupational History  ? Not on file  ?Tobacco Use  ? Smoking status: Some Days  ?  Types: Pipe  ? Smokeless tobacco: Never  ?Vaping Use  ? Vaping Use:  Never used  ?Substance and Sexual Activity  ? Alcohol use: Never  ? Drug use: Never  ? Sexual activity: Not Currently  ?Other Topics Concern  ? Not on file  ?Social History Narrative  ? Wife passed away in 05-23-2007.  ? 2 daughters, 1 son. All live close by.   ? 3 grandchildren.   ? ?Social Determinants of Health  ? ?Financial Resource Strain: Low Risk   ? Difficulty of Paying Living Expenses: Not hard at all  ?Food Insecurity: No Food Insecurity  ? Worried About Charity fundraiser in the Last Year: Never true  ? Ran Out of Food in the Last Year: Never true  ?Transportation Needs: No Transportation Needs  ? Lack of Transportation (Medical): No  ? Lack of Transportation (Non-Medical): No  ?Physical Activity: Insufficiently Active  ? Days of Exercise per Week: 7 days  ? Minutes of Exercise per Session: 20 min  ?Stress: No Stress Concern Present  ? Feeling of Stress : Not at all  ?Social Connections: Moderately Integrated  ? Frequency of Communication with Friends and Family: More than three times a week  ? Frequency of Social Gatherings with Friends and Family: More than three times a week  ? Attends Religious Services: 1 to 4 times per year  ? Active Member of Clubs or Organizations: No  ? Attends Archivist Meetings: 1 to 4 times per year  ? Marital Status: Widowed  ?Intimate Partner Violence: Not At Risk  ? Fear of Current or Ex-Partner: No  ? Emotionally Abused: No  ? Physically Abused: No  ? Sexually Abused: No  ? ?Family History  ?Problem Relation Age of Onset  ? Clotting disorder Mother   ? Hypertension Sister   ? Diabetes Sister   ? ?ROS: All systems reviewed and negative except as per HPI.  ? ?Current Outpatient Medications  ?Medication Sig Dispense Refill  ? acetaminophen (TYLENOL) 500 MG tablet Take 500 mg by mouth every 8 (eight) hours as needed for moderate pain.    ? albuterol (VENTOLIN HFA) 108 (90 Base) MCG/ACT inhaler Inhale 2 puffs into the lungs every 6 (six) hours as needed for wheezing or  shortness of breath. 1 each 3  ? ALPRAZolam (XANAX) 0.25 MG tablet Take 1 tablet (0.25 mg total) by mouth at bedtime as needed for anxiety. 90 tablet 1  ? amiodarone (PACERONE) 200 MG tablet Take 0.5 tablets (100 mg total) by mouth daily. 30 tablet 11  ? apixaban (ELIQUIS) 5 MG TABS tablet Take 1 tablet (5 mg total) by mouth 2 (two) times daily. 180 tablet 3  ? atorvastatin (LIPITOR) 40 MG tablet Take 1 tablet (40 mg total) by mouth daily. 90 tablet 3  ? dapagliflozin propanediol (FARXIGA) 10 MG TABS tablet Take 1 tablet (10 mg total) by mouth daily before breakfast. 90 tablet 3  ? ezetimibe (ZETIA) 10 MG tablet Take 1 tablet (10 mg total) by mouth daily. 30 tablet 11  ? fluticasone (FLONASE) 50 MCG/ACT nasal spray USE ONE SPRAY(S) IN  EACH NOSTRIL ONCE DAILY 16 g 11  ? furosemide (LASIX) 20 MG tablet Take 1 tablet (20 mg total) by mouth daily. 90 tablet 3  ? insulin NPH Human (NOVOLIN N) 100 UNIT/ML injection 10 units AC breakfast and 10 units AC supper (Patient taking differently: 18 units AC breakfast and 8 units AC supper) 30 mL 11  ? insulin regular (NOVOLIN R) 100 units/mL injection 16 units w bkfst and 13 units w dinner (Patient taking differently: 16 units in the middle of the day.) 10 mL 5  ? lisinopril (ZESTRIL) 5 MG tablet Take 1 tablet (5 mg total) by mouth daily. 90 tablet 1  ? loratadine (CLARITIN) 10 MG tablet Take 10 mg by mouth daily as needed for allergies.    ? metoprolol succinate (TOPROL-XL) 50 MG 24 hr tablet Take 50 mg by mouth daily. Take with or immediately following a meal.    ? mycophenolate (CELLCEPT) 250 MG capsule Take 500 mg by mouth 2 (two) times daily.    ? nitroGLYCERIN (NITROSTAT) 0.4 MG SL tablet Place 1 tablet (0.4 mg total) under the tongue every 5 (five) minutes as needed for chest pain. 25 tablet 3  ? ondansetron (ZOFRAN) 4 MG tablet Take 1 tablet (4 mg total) by mouth every 8 (eight) hours as needed for nausea or vomiting. 20 tablet 0  ? predniSONE (DELTASONE) 5 MG tablet Take  5 mg by mouth daily with breakfast.    ? RELION INSULIN SYRINGE 31G X 15/64" 0.5 ML MISC USE AS DIRECTED FOR INSULIN    ? sodium bicarbonate 650 MG tablet Take 1,300 mg by mouth 2 (two) times daily.    ? sul

## 2021-06-10 ENCOUNTER — Telehealth (HOSPITAL_COMMUNITY): Payer: Self-pay | Admitting: Pharmacy Technician

## 2021-06-10 ENCOUNTER — Encounter (HOSPITAL_COMMUNITY): Payer: Self-pay

## 2021-06-10 ENCOUNTER — Other Ambulatory Visit (HOSPITAL_COMMUNITY): Payer: Self-pay

## 2021-06-10 ENCOUNTER — Ambulatory Visit (HOSPITAL_COMMUNITY)
Admission: RE | Admit: 2021-06-10 | Discharge: 2021-06-10 | Disposition: A | Discharge status: Home / Self Care | Payer: Medicare Other | Source: Ambulatory Visit | Attending: Family Medicine | Admitting: Family Medicine

## 2021-06-10 VITALS — BP 164/66 | HR 58 | Wt 209.4 lb

## 2021-06-10 DIAGNOSIS — I252 Old myocardial infarction: Secondary | ICD-10-CM | POA: Diagnosis not present

## 2021-06-10 DIAGNOSIS — I4819 Other persistent atrial fibrillation: Secondary | ICD-10-CM | POA: Insufficient documentation

## 2021-06-10 DIAGNOSIS — I4891 Unspecified atrial fibrillation: Secondary | ICD-10-CM | POA: Diagnosis not present

## 2021-06-10 DIAGNOSIS — I2510 Atherosclerotic heart disease of native coronary artery without angina pectoris: Secondary | ICD-10-CM | POA: Insufficient documentation

## 2021-06-10 DIAGNOSIS — I130 Hypertensive heart and chronic kidney disease with heart failure and stage 1 through stage 4 chronic kidney disease, or unspecified chronic kidney disease: Secondary | ICD-10-CM | POA: Diagnosis not present

## 2021-06-10 DIAGNOSIS — I5022 Chronic systolic (congestive) heart failure: Secondary | ICD-10-CM | POA: Diagnosis present

## 2021-06-10 DIAGNOSIS — Z7901 Long term (current) use of anticoagulants: Secondary | ICD-10-CM | POA: Insufficient documentation

## 2021-06-10 DIAGNOSIS — N183 Chronic kidney disease, stage 3 unspecified: Secondary | ICD-10-CM | POA: Diagnosis not present

## 2021-06-10 DIAGNOSIS — Z8673 Personal history of transient ischemic attack (TIA), and cerebral infarction without residual deficits: Secondary | ICD-10-CM | POA: Insufficient documentation

## 2021-06-10 DIAGNOSIS — Z940 Kidney transplant status: Secondary | ICD-10-CM | POA: Insufficient documentation

## 2021-06-10 DIAGNOSIS — Z8616 Personal history of COVID-19: Secondary | ICD-10-CM | POA: Insufficient documentation

## 2021-06-10 DIAGNOSIS — Z79899 Other long term (current) drug therapy: Secondary | ICD-10-CM | POA: Insufficient documentation

## 2021-06-10 DIAGNOSIS — E1122 Type 2 diabetes mellitus with diabetic chronic kidney disease: Secondary | ICD-10-CM | POA: Diagnosis not present

## 2021-06-10 DIAGNOSIS — I255 Ischemic cardiomyopathy: Secondary | ICD-10-CM | POA: Diagnosis not present

## 2021-06-10 DIAGNOSIS — I10 Essential (primary) hypertension: Secondary | ICD-10-CM

## 2021-06-10 LAB — BASIC METABOLIC PANEL
Anion gap: 7 (ref 5–15)
BUN: 35 mg/dL — ABNORMAL HIGH (ref 8–23)
CO2: 25 mmol/L (ref 22–32)
Calcium: 8.4 mg/dL — ABNORMAL LOW (ref 8.9–10.3)
Chloride: 106 mmol/L (ref 98–111)
Creatinine, Ser: 1.85 mg/dL — ABNORMAL HIGH (ref 0.61–1.24)
GFR, Estimated: 38 mL/min — ABNORMAL LOW (ref >60)
Glucose, Bld: 201 mg/dL — ABNORMAL HIGH (ref 70–99)
Potassium: 4 mmol/L (ref 3.5–5.1)
Sodium: 138 mmol/L (ref 135–145)

## 2021-06-10 LAB — BRAIN NATRIURETIC PEPTIDE: B Natriuretic Peptide: 856.9 pg/mL — ABNORMAL HIGH (ref 0.0–100.0)

## 2021-06-10 NOTE — Patient Instructions (Addendum)
EKG done today. ? ?RedsClip done today. ? ?Labs done today. We will contact you only if your labs are abnormal. ? ?INCREASE Lasix to '40mg'$  (2 tablets) by mouth daily for 3 days THEN DECREASE to '20mg'$  (1 tablet) by mouth daily.  ? ?No other medication changes were made. Please continue all current medications as prescribed. ? ?Your physician recommends that you schedule a follow-up appointment in: 10-14 days for a lab only appointment, 6-8 weeks with our NP/[facility] and in 3 months with Dr. Aundra Dubin. ? ?If you have any questions or concerns before your next appointment please send [place] a message through [place] or call our office at [phone].   ? ?TO LEAVE A MESSAGE FOR THE NURSE SELECT OPTION 2, PLEASE LEAVE A MESSAGE INCLUDING: ?YOUR NAME ?DATE OF BIRTH ?CALL BACK NUMBER ?REASON FOR CALL**this is important as we prioritize the call backs ? ?YOU WILL RECEIVE A CALL BACK THE SAME DAY AS LONG AS YOU CALL BEFORE 4:00 PM ? ? ?Do the following things EVERYDAY: ?Weigh yourself in the morning before breakfast. Write it down and keep it in a log. ?Take your medicines as prescribed ?Eat low salt foods--Limit salt (sodium) to 2000 mg per day.  ?Stay as active as you can everyday ?Limit all fluids for the day to less than 2 liters ? ? ?At the [facility], you and your health needs are our priority. As part of our continuing mission to provide you with exceptional heart care, we have created designated Provider Care Teams. These Care Teams include your primary Cardiologist (physician) and Advanced Practice Providers (APPs- Physician Assistants and Nurse Practitioners) who all work together to provide you with the care you need, when you need it.  ? ?You may see any of the following providers on your designated Care Team at your next follow up: ?Dr Glori Bickers ?Dr Loralie Champagne ?Darrick Grinder, NP ?Lyda Jester, PA ?Audry Riles, PharmD ? ? ?Please be sure to bring in all your medications bottles to every  appointment.  ? ?

## 2021-06-10 NOTE — Telephone Encounter (Signed)
Advanced Heart Failure Patient Advocate Encounter ? ?The patient was approved for a [employer] that will help cover the cost of Truett Mainland, Farxiga. Total amount awarded, $10,000. Eligibility, 05/11/21 - 05/11/22. ? ?ID [id number] ? ?BIN [id number] ? ?PCN [id number] ? ?Group [id number] ? ?Called and spoke with the patient's daughter. Emailed her a copy of the grant information to take to the pharmacy. ? ?Charlann Boxer, CPhT ? ? ?

## 2021-06-23 ENCOUNTER — Other Ambulatory Visit: Payer: Medicare Other

## 2021-06-23 DIAGNOSIS — I5022 Chronic systolic (congestive) heart failure: Secondary | ICD-10-CM

## 2021-06-23 NOTE — Addendum Note (Signed)
Addended by: Collier Bullock on: 06/23/2021 09:44 AM ? ? Modules accepted: Orders ? ?

## 2021-06-24 LAB — BASIC METABOLIC PANEL
BUN/Creatinine Ratio: 19 (ref 10–24)
BUN: 35 mg/dL — ABNORMAL HIGH (ref 8–27)
CO2: 21 mmol/L (ref 20–29)
Calcium: 8.8 mg/dL (ref 8.6–10.2)
Chloride: 103 mmol/L (ref 96–106)
Creatinine, Ser: 1.83 mg/dL — ABNORMAL HIGH (ref 0.76–1.27)
Glucose: 138 mg/dL — ABNORMAL HIGH (ref 70–99)
Potassium: 4.2 mmol/L (ref 3.5–5.2)
Sodium: 141 mmol/L (ref 134–144)
eGFR: 38 mL/min/{1.73_m2} — ABNORMAL LOW (ref >59)

## 2021-07-28 NOTE — Progress Notes (Signed)
PCP: Claretta Fraise, MD Nephrology: Dr. Joseph Berkshire Cardiology: Dr. Domenic Polite HF Cardiology: Dr. Aundra Dubin  77 y.o. with history of suspected CAD/NSTEMI, CKD stage 3 s/p renal transplant, chronic systolic CHF, and persistent atrial fibrillation was referred by Dr. Domenic Polite for evaluation of CHF, atrial fibrillation, and suspected CAD.  Patient was doing fairly well until 1/22.  He developed COVID-19 PNA.  Hospitalization was complicated by NSTEMI and the onset of atrial fibrillation.  Cath was not done with elevated creatinine.  Echo in 1/22 showed EF 40-45% with wall motion abnormalities.  Cardiolite done as an outpatient in 3/22 showed EF 21%, inferior and inferolateral fixed defect.    Patient had CVA in 2/22, likely related to atrial fibrillation.  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  He did PT.   Repeat echo in 4/22 showed EF down further to 25-30%.  RHC/LHC was done in 8/22.  This showed severe 3VD with occluded RCA with collaterals, 99% proximal-mid LAD stenosis, 80% ostial LCx, significant disease in OMs and diagonals.  RHC showed normal filling pressures and preserved cardiac output.   He saw Dr. Cyndia Bent and decided that he did not want to undergo CABG.  Dr. Aundra Dubin reviewed the cath films with Dr. Burt Knack. Could potentially intervene on LAD, but would essentially be a CTO procedure with increased risk to the patient (including worsening renal function).  Based on recently released REVIVED-BCIS trial, do not think that Daniel Myers would derive much benefit from PCI in this situation in the absence of significant angina (which he does not seem to have).   In 9/22, he had DCCV back to NSR.  HR in 40s at rest.   Follow up 1/23, he stopped Entresto due to light headedness. Unable to start low dose ARB due to elevated renal function. Nephrology stopped spironolactone 1/23. PCP started lisinopril 3/23.  Today he returns for HF follow up with his daughter. Overall feeling fine. He has mild  dyspnea when he pushes himself doing housework. Denies palpitations, CP, dizziness, edema, or PND/Orthopnea. Appetite ok. No fever or chills. Weight at home 205-210 pounds. Taking all medications.  ECG (personally reviewed): SB 50 bpm  REDs: 35%  Labs (6/22): LDL 92, HDL 40, K 5.1, creatinine 1.7 Labs (8/22): K 5.4, creatinine 2.16, LFTs normal, TSH normal, plts 123, hgb 15.2 Labs (9/22): K 4.7, creatinine 2.11 Labs (11/22): K 4.9, creatinine 2.34, LDL 52, TGs 128, BNP 573 Labs (3/23): K 4.7, creatinine 2.34, LDL 54, HDL 34  PMH: 1. Type 2 diabetes 2. HTN 3. Hyperlipidemia 4. H/o COVID-19 PNA in 1/22.  5. CKD: Stage 3.  History of renal transplant.   6. 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  7. Thrombocytopenia 8. Skin cancer 9. OSA 10. CAD: NSTEMI in 1/22 during admission for COVID PNA.  HS-TnI to 8647.  Not cathed with elevated creatinine.  - Cardiolite (3/22): EF 21%, inferior and inferolateral fixed defect, apical fixed defect.  - LHC (8/22): Severe 3VD with occluded RCA with collaterals, 99% proximal-mid LAD stenosis, 80% ostial LCx, significant disease in OMs and diagonals. 11. Chronic systolic CHF: Ischemic cardiomyopathy.  - Echo (1/22): EF 40-45%, wall motion abnormalities.  - Echo (4/22): EF 25-30%, global hypokinesis.  - RHC/LHC (8/22): severe 3VD with occluded RCA with collaterals, 99% proximal-mid LAD stenosis, 80% ostial LCx, significant disease in OMs and diagonals. Mean RA 2, PA 21/7, mean PCWP 11, CI 2.2 Fick/2.33 thermodilution 12. Atrial fibrillation: Persistent since 1/22. DCCV to NSR  in 9/22.   Social History   Socioeconomic History   Marital status: Widowed    Spouse name: Not on file   Number of children: 3   Years of education: Not on file   Highest education level: Not on file  Occupational History   Not on file  Tobacco Use   Smoking status: Some Days    Types: Pipe   Smokeless tobacco: Never  Vaping Use   Vaping Use:  Never used  Substance and Sexual Activity   Alcohol use: Never   Drug use: Never   Sexual activity: Not Currently  Other Topics Concern   Not on file  Social History Narrative   Wife passed away in 2007/05/21.   2 daughters, 1 son. All live close by.    3 grandchildren.    Social Determinants of Health   Financial Resource Strain: Low Risk  (04/15/2021)   Overall Financial Resource Strain (CARDIA)    Difficulty of Paying Living Expenses: Not hard at all  Food Insecurity: No Food Insecurity (04/15/2021)   Hunger Vital Sign    Worried About Running Out of Food in the Last Year: Never true    Ran Out of Food in the Last Year: Never true  Transportation Needs: No Transportation Needs (04/15/2021)   PRAPARE - Hydrologist (Medical): No    Lack of Transportation (Non-Medical): No  Physical Activity: Insufficiently Active (04/15/2021)   Exercise Vital Sign    Days of Exercise per Week: 7 days    Minutes of Exercise per Session: 20 min  Stress: No Stress Concern Present (04/15/2021)   [place]    Feeling of Stress : Not at all  Social Connections: Moderately Integrated (04/15/2021)   Social Connection and Isolation Panel [NHANES]    Frequency of Communication with Friends and Family: More than three times a week    Frequency of Social Gatherings with Friends and Family: More than three times a week    Attends Religious Services: 1 to 4 times per year    Active Member of [employer] or Organizations: No    Attends Archivist Meetings: 1 to 4 times per year    Marital Status: Widowed  Intimate Partner Violence: Not At Risk (04/15/2021)   Humiliation, Afraid, Rape, and Kick questionnaire    Fear of Current or Ex-Partner: No    Emotionally Abused: No    Physically Abused: No    Sexually Abused: No   Family History  Problem Relation Age of Onset   Clotting disorder Mother    Hypertension Sister     Diabetes Sister    ROS: All systems reviewed and negative except as per HPI.   Current Outpatient Medications  Medication Sig Dispense Refill   acetaminophen (TYLENOL) 500 MG tablet Take 500 mg by mouth every 8 (eight) hours as needed for moderate pain.     albuterol (VENTOLIN HFA) 108 (90 Base) MCG/ACT inhaler Inhale 2 puffs into the lungs every 6 (six) hours as needed for wheezing or shortness of breath. 1 each 3   ALPRAZolam (XANAX) 0.25 MG tablet Take 1 tablet (0.25 mg total) by mouth at bedtime as needed for anxiety. 90 tablet 1   amiodarone (PACERONE) 200 MG tablet Take 0.5 tablets (100 mg total) by mouth daily. 30 tablet 11   apixaban (ELIQUIS) 5 MG TABS tablet Take 1 tablet (5 mg total) by mouth 2 (two) times daily. 180 tablet  3   atorvastatin (LIPITOR) 40 MG tablet Take 1 tablet (40 mg total) by mouth daily. 90 tablet 3   dapagliflozin propanediol (FARXIGA) 10 MG TABS tablet Take 1 tablet (10 mg total) by mouth daily before breakfast. 90 tablet 3   ezetimibe (ZETIA) 10 MG tablet Take 1 tablet (10 mg total) by mouth daily. 30 tablet 11   fluticasone (FLONASE) 50 MCG/ACT nasal spray USE ONE SPRAY(S) IN EACH NOSTRIL ONCE DAILY 16 g 11   furosemide (LASIX) 20 MG tablet Take 1 tablet (20 mg total) by mouth daily. 90 tablet 3   insulin NPH Human (NOVOLIN N) 100 UNIT/ML injection 10 units AC breakfast and 10 units AC supper (Patient taking differently: 18 units AC breakfast and 8 units AC supper) 30 mL 11   insulin regular (NOVOLIN R) 100 units/mL injection 16 units w bkfst and 13 units w dinner (Patient taking differently: 16 units in the middle of the day.) 10 mL 5   lisinopril (ZESTRIL) 5 MG tablet Take 1 tablet (5 mg total) by mouth daily. 90 tablet 1   loratadine (CLARITIN) 10 MG tablet Take 10 mg by mouth daily as needed for allergies.     metoprolol succinate (TOPROL-XL) 50 MG 24 hr tablet Take 50 mg by mouth daily. Take with or immediately following a meal.     mycophenolate (CELLCEPT)  250 MG capsule Take 500 mg by mouth 2 (two) times daily.     nitroGLYCERIN (NITROSTAT) 0.4 MG SL tablet Place 1 tablet (0.4 mg total) under the tongue every 5 (five) minutes as needed for chest pain. 25 tablet 3   ondansetron (ZOFRAN) 4 MG tablet Take 1 tablet (4 mg total) by mouth every 8 (eight) hours as needed for nausea or vomiting. 20 tablet 0   predniSONE (DELTASONE) 5 MG tablet Take 5 mg by mouth daily with breakfast.     RELION INSULIN SYRINGE 31G X 15/64" 0.5 ML MISC USE AS DIRECTED FOR INSULIN     sodium bicarbonate 650 MG tablet Take 1,300 mg by mouth 2 (two) times daily.     sulfamethoxazole-trimethoprim (BACTRIM) 400-80 MG tablet Take 1 tablet by mouth every Monday, Wednesday, and Friday.     tacrolimus (PROGRAF) 0.5 MG capsule Take 1 mg by mouth in the morning and at bedtime.     Vericiguat (VERQUVO) 2.5 MG TABS Take 2.5 mg by mouth daily.     No current facility-administered medications for this visit.   Wt Readings from Last 3 Encounters:  06/10/21 95 kg (209 lb 6.4 oz)  04/30/21 92 kg (202 lb 12.8 oz)  04/15/21 88.5 kg (195 lb)   There were no vitals taken for this visit. General:  NAD. No resp difficulty, walked into clinic with cane HEENT: Normal Neck: Supple. JVP to jaw. Carotids 2+ bilat; no bruits. No lymphadenopathy or thryomegaly appreciated. Cor: PMI nondisplaced. Irregular rate & rhythm. No rubs, gallops or murmurs. Lungs: Clear Abdomen: Obese, nontender, nondistended. No hepatosplenomegaly. No bruits or masses. Good bowel sounds. Extremities: No cyanosis, clubbing, rash, edema Neuro: Alert & oriented x 3, cranial nerves grossly intact. Moves all 4 extremities w/o difficulty. Affect pleasant.  Assessment/Plan: 1. Chronic systolic CHF: Echo in 9/47 with EF 25-30%.   Patient had NSTEMI during 1/22 admission with COVID-19, not cathed with CKD.  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 RHC in 8/22 with preserved cardiac output and low filling pressures.  NYHA  class II. He is volume overloaded on exam, weight up 12 lbs, although  REDs 35%.  Medication titration limited by CKD, orthostatic symptoms, and bradycardia.   - Increase Lasix to 40 mg daily x 3 days, then back to 20 mg daily. BMET/BNP today, repeat BMET in 10 days. - Continue lisinopril 5 mg daily (did not tolerate Entresto due to lightheadedness).  - Continue Toprol XL 50 mg daily.  - Continue Farxiga 10 mg daily. - Continue vericiguat 2.5 mg daily, did not tolerate 5 mg daily.  - Arlyce Harman stopped by Nephrology 1/23 due to worsening renal function. - He says that he would not be interested in ICD.  2. CAD: Cath in 8/22 with severe 3VD, occluded RCA with collaterals, 99% proximal-mid LAD stenosis, 80% ostial LCx, significant disease in OMs and diagonals.  With low EF and 3VD, I recommended CABG.  He saw Dr. Cyndia Bent, and is adamant that he does not want to have surgery.  Dr. Cyndia Bent wanted him to get an MRI for viability, but if he is not going to have CABG, would not get MRI.  I reviewed his cath films with Dr. Burt Knack for PCI options.  Could potentially intervene on LAD, but would essentially be a CTO procedure with increased risk to the patient (including worsening renal function).  Based on recent REVIVED-BCIS trial, I do not think that Daniel Myers would derive much benefit from PCI in this situation in the absence of significant angina (which he does not have currently).  No chest pain.  - Continue atorvastatin, good lipids in 3/23.  - He is not on ASA due to Eliquis use.  3. Atrial fibrillation: DCCV to NSR in 9/22. Irregular on exam today but ECG shows SB.  - Continue amiodarone 100 mg daily.  LFTs and TSH ok 1/23, he will need regular eye exam while on amiodarone.  - Continue Eliquis 5 mg bid. - We have discussed AF ablation, but he is not interested in invasive procedures.   4. 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  5. CKD: stage 3.  Post-transplant.  Followed by nephrology, Dr. Joseph Berkshire. - BMET today. Follow  kidney function closely with diuresis.  6. HTN: Elevated today, but generally well-controlled at home. - Diurese as above.  - Consider adding amlodipine if remains elevated.  Patient is willing to take medications but does not want any invasive procedures.    Follow up in 6-8 weeks with APP and 3-4 months with Dr. Aundra Dubin.  Daniel Myers [facility] FNP-BC 07/28/2021

## 2021-07-29 ENCOUNTER — Ambulatory Visit (HOSPITAL_COMMUNITY)
Admission: RE | Admit: 2021-07-29 | Discharge: 2021-07-29 | Disposition: A | Discharge status: Home / Self Care | Payer: Medicare Other | Source: Ambulatory Visit | Attending: Family Medicine | Admitting: Family Medicine

## 2021-07-29 ENCOUNTER — Encounter (HOSPITAL_COMMUNITY): Payer: Self-pay

## 2021-07-29 VITALS — BP 140/68 | HR 56 | Wt 208.6 lb

## 2021-07-29 DIAGNOSIS — Z8616 Personal history of COVID-19: Secondary | ICD-10-CM | POA: Insufficient documentation

## 2021-07-29 DIAGNOSIS — I252 Old myocardial infarction: Secondary | ICD-10-CM | POA: Insufficient documentation

## 2021-07-29 DIAGNOSIS — Z7901 Long term (current) use of anticoagulants: Secondary | ICD-10-CM | POA: Diagnosis not present

## 2021-07-29 DIAGNOSIS — Z7984 Long term (current) use of oral hypoglycemic drugs: Secondary | ICD-10-CM | POA: Insufficient documentation

## 2021-07-29 DIAGNOSIS — I130 Hypertensive heart and chronic kidney disease with heart failure and stage 1 through stage 4 chronic kidney disease, or unspecified chronic kidney disease: Secondary | ICD-10-CM | POA: Diagnosis not present

## 2021-07-29 DIAGNOSIS — I10 Essential (primary) hypertension: Secondary | ICD-10-CM

## 2021-07-29 DIAGNOSIS — Z79899 Other long term (current) drug therapy: Secondary | ICD-10-CM | POA: Diagnosis not present

## 2021-07-29 DIAGNOSIS — Z8673 Personal history of transient ischemic attack (TIA), and cerebral infarction without residual deficits: Secondary | ICD-10-CM | POA: Insufficient documentation

## 2021-07-29 DIAGNOSIS — I5042 Chronic combined systolic (congestive) and diastolic (congestive) heart failure: Secondary | ICD-10-CM | POA: Diagnosis not present

## 2021-07-29 DIAGNOSIS — N183 Chronic kidney disease, stage 3 unspecified: Secondary | ICD-10-CM | POA: Diagnosis not present

## 2021-07-29 DIAGNOSIS — I5022 Chronic systolic (congestive) heart failure: Secondary | ICD-10-CM | POA: Insufficient documentation

## 2021-07-29 DIAGNOSIS — E1122 Type 2 diabetes mellitus with diabetic chronic kidney disease: Secondary | ICD-10-CM | POA: Diagnosis not present

## 2021-07-29 DIAGNOSIS — Z940 Kidney transplant status: Secondary | ICD-10-CM | POA: Insufficient documentation

## 2021-07-29 DIAGNOSIS — I4819 Other persistent atrial fibrillation: Secondary | ICD-10-CM | POA: Diagnosis not present

## 2021-07-29 DIAGNOSIS — I4891 Unspecified atrial fibrillation: Secondary | ICD-10-CM | POA: Diagnosis not present

## 2021-07-29 DIAGNOSIS — I2510 Atherosclerotic heart disease of native coronary artery without angina pectoris: Secondary | ICD-10-CM | POA: Diagnosis not present

## 2021-07-29 LAB — BRAIN NATRIURETIC PEPTIDE: B Natriuretic Peptide: 654 pg/mL — ABNORMAL HIGH (ref 0.0–100.0)

## 2021-07-29 LAB — BASIC METABOLIC PANEL
Anion gap: 10 (ref 5–15)
BUN: 37 mg/dL — ABNORMAL HIGH (ref 8–23)
CO2: 26 mmol/L (ref 22–32)
Calcium: 8.8 mg/dL — ABNORMAL LOW (ref 8.9–10.3)
Chloride: 103 mmol/L (ref 98–111)
Creatinine, Ser: 1.83 mg/dL — ABNORMAL HIGH (ref 0.61–1.24)
GFR, Estimated: 38 mL/min — ABNORMAL LOW (ref >60)
Glucose, Bld: 155 mg/dL — ABNORMAL HIGH (ref 70–99)
Potassium: 4 mmol/L (ref 3.5–5.1)
Sodium: 139 mmol/L (ref 135–145)

## 2021-07-29 NOTE — Patient Instructions (Addendum)
Good to see you today!  Labs done today, your results will be available in MyChart, we will contact you for abnormal readings.  Your physician recommends that you schedule a follow-up appointment  with Dr.Mc Lean  If you have any questions or concerns before your next appointment please send [place] a message through [place] or call our office at [phone].    TO LEAVE A MESSAGE FOR THE NURSE SELECT OPTION 2, PLEASE LEAVE A MESSAGE INCLUDING: YOUR NAME DATE OF BIRTH CALL BACK NUMBER REASON FOR CALL**this is important as we prioritize the call backs  YOU WILL RECEIVE A CALL BACK THE SAME DAY AS LONG AS YOU CALL BEFORE 4:00 PM  At the [facility], you and your health needs are our priority. As part of our continuing mission to provide you with exceptional heart care, we have created designated Provider Care Teams. These Care Teams include your primary Cardiologist (physician) and Advanced Practice Providers (APPs- Physician Assistants and Nurse Practitioners) who all work together to provide you with the care you need, when you need it.   You may see any of the following providers on your designated Care Team at your next follow up: Dr Glori Bickers Dr Haynes Kerns, NP Lyda Jester, [facility] [place], [place] Audry Riles, PharmD   Please be sure to bring in all your medications bottles to every appointment.

## 2021-08-05 ENCOUNTER — Encounter: Payer: Self-pay | Admitting: Family Medicine

## 2021-08-05 ENCOUNTER — Ambulatory Visit (INDEPENDENT_AMBULATORY_CARE_PROVIDER_SITE_OTHER): Payer: Medicare Other | Admitting: Family Medicine

## 2021-08-05 VITALS — BP 160/77 | HR 59 | Temp 97.5°F | Ht 70.0 in | Wt 209.0 lb

## 2021-08-05 DIAGNOSIS — I2510 Atherosclerotic heart disease of native coronary artery without angina pectoris: Secondary | ICD-10-CM | POA: Diagnosis not present

## 2021-08-05 DIAGNOSIS — E782 Mixed hyperlipidemia: Secondary | ICD-10-CM

## 2021-08-05 DIAGNOSIS — E119 Type 2 diabetes mellitus without complications: Secondary | ICD-10-CM

## 2021-08-05 DIAGNOSIS — Z940 Kidney transplant status: Secondary | ICD-10-CM

## 2021-08-05 DIAGNOSIS — I10 Essential (primary) hypertension: Secondary | ICD-10-CM | POA: Diagnosis not present

## 2021-08-05 DIAGNOSIS — Z794 Long term (current) use of insulin: Secondary | ICD-10-CM

## 2021-08-05 DIAGNOSIS — G4700 Insomnia, unspecified: Secondary | ICD-10-CM | POA: Diagnosis not present

## 2021-08-05 DIAGNOSIS — Z79899 Other long term (current) drug therapy: Secondary | ICD-10-CM

## 2021-08-05 LAB — BAYER DCA HB A1C WAIVED: HB A1C (BAYER DCA - WAIVED): 6.8 % — ABNORMAL HIGH (ref 4.8–5.6)

## 2021-08-05 MED ORDER — EZETIMIBE 10 MG PO TABS: 10.0000 mg | ORAL_TABLET | Freq: Every day | ORAL | 11 refills | Status: DC

## 2021-08-05 MED ORDER — ALPRAZOLAM 0.25 MG PO TABS: 0.2500 mg | ORAL_TABLET | Freq: Every evening | ORAL | 1 refills | Status: DC | PRN

## 2021-08-05 MED ORDER — METOPROLOL SUCCINATE ER 100 MG PO TB24
100.0000 mg | ORAL_TABLET | Freq: Every day | ORAL | 3 refills | Status: DC
Start: 2021-08-05 — End: 2021-12-21

## 2021-08-05 MED ORDER — INSULIN NPH (HUMAN) (ISOPHANE) 100 UNIT/ML ~~LOC~~ SUSP: SUBCUTANEOUS | 11 refills | Status: AC

## 2021-08-05 NOTE — Progress Notes (Signed)
Subjective:  Patient ID: Daniel Myers,  male    DOB: 1945-03-12  Age: 76 y.o.    CC: Medical Management of Chronic Issues   HPI Daniel Myers presents for  follow-up of hypertension. Patient has no history of headache chest pain or shortness of breath or recent cough. Patient also denies symptoms of TIA such as numbness weakness lateralizing. Patient denies side effects from medication. States taking it regularly.  Patient also  in for follow-up of elevated cholesterol. Doing well without complaints on current medication. Denies side effects  including myalgia and arthralgia and nausea. Also in today for liver function testing. Currently no chest pain, shortness of breath or other cardiovascular related symptoms noted.  Follow-up of diabetes. Patient does check blood sugar at home. Readings run between 100 and 150 Patient denies symptoms such as excessive hunger or urinary frequency, excessive hunger, nausea No significant hypoglycemic spells noted. Medications reviewed. Pt reports taking them regularly. Pt. denies complication/adverse reaction today.    History Daniel Myers has a past medical history of Acute respiratory failure with hypoxia ([place]) (02/12/2020), AKI (acute kidney injury) ([place]) (02/13/2020), Atrial fibrillation with RVR ([place]) (03/11/2020), Basal cell carcinoma (06/27/2013), Basal cell carcinoma (07/01/2014), Basal cell carcinoma (10/10/2014), Basal cell carcinoma (02/11/2015), Basal cell carcinoma (06/14/2016), Basal cell carcinoma (02/22/2017), Basal cell carcinoma (04/11/2018), Chronic kidney disease, History of renal transplant, Hyperlipidemia, Hypertension, NSTEMI (non-ST elevated myocardial infarction) ([place]) (02/13/2020), 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), 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), 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), Squamous cell carcinoma of skin (11/08/2011), Squamous cell carcinoma of skin (11/08/2011), Squamous cell carcinoma of  skin (11/08/2011), 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), 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), 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), 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), 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), 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), 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), 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), Squamous cell carcinoma of skin (09/30/2015), Squamous cell carcinoma of skin (02/22/2017), Squamous cell carcinoma of skin (02/22/2017), Squamous cell carcinoma of skin (04/11/2018), Squamous cell carcinoma of skin (04/11/2018), Squamous cell carcinoma of skin (04/11/2018), Squamous cell carcinoma of skin (04/11/2018), Squamous cell carcinoma of skin (04/11/2018), Squamous cell carcinoma of skin (09/28/2018), Squamous cell carcinoma of skin (09/28/2018), Squamous cell carcinoma of skin (09/28/2018), Squamous cell carcinoma of skin (03/21/2019), Squamous cell carcinoma of skin (03/21/2019), Squamous cell carcinoma of skin (03/21/2019), Type 2 diabetes mellitus ([place]), and Unspecified atrial fibrillation ([place]) (02/17/2020).   He has a past surgical history that includes Kidney transplant (Right); RIGHT/LEFT HEART CATH AND CORONARY ANGIOGRAPHY (N/A, 09/09/2020); and Cardioversion (N/A, 10/16/2020).   His family history includes Clotting disorder in his mother; Diabetes in his sister; Hypertension in his sister.He reports that he has been smoking pipe. He has never used smokeless tobacco. He reports that he does not drink alcohol and does not use drugs.  Current Outpatient Medications on File Prior to Visit  Medication Sig Dispense Refill   acetaminophen (TYLENOL) 500 MG tablet Take 500 mg by mouth every 8 (eight) hours as needed for moderate pain.     albuterol (VENTOLIN HFA) 108 (90 Base) MCG/ACT inhaler Inhale 2 puffs into the lungs every 6 (six) hours as needed for wheezing or shortness of breath. 1 each 3   amiodarone (PACERONE)  200 MG tablet Take 0.5 tablets (100 mg total) by mouth daily. 30 tablet 11   apixaban (ELIQUIS) 5 MG TABS tablet Take 1 tablet (5 mg total) by mouth 2 (two)  times daily. 180 tablet 3   atorvastatin (LIPITOR) 40 MG tablet Take 1 tablet (40 mg total) by mouth daily. 90 tablet 3   dapagliflozin propanediol (FARXIGA) 10 MG TABS tablet Take 1 tablet (10 mg total) by mouth daily before breakfast. 90 tablet 3   fluticasone (FLONASE) 50 MCG/ACT nasal spray USE ONE SPRAY(S) IN EACH NOSTRIL ONCE DAILY 16 g 11   furosemide (LASIX) 20 MG tablet Take 1 tablet (20 mg total) by mouth daily. 90 tablet 3   insulin regular (NOVOLIN R) 100 units/mL injection 16 units w bkfst and 13 units w dinner 10 mL 5   lisinopril (ZESTRIL) 5 MG tablet Take 1 tablet (5 mg total) by mouth daily. 90 tablet 1   loratadine (CLARITIN) 10 MG tablet Take 10 mg by mouth daily as needed for allergies.     mycophenolate (CELLCEPT) 250 MG capsule Take 500 mg by mouth 2 (two) times daily.     nitroGLYCERIN (NITROSTAT) 0.4 MG SL tablet Place 1 tablet (0.4 mg total) under the tongue every 5 (five) minutes as needed for chest pain. 25 tablet 3   ondansetron (ZOFRAN) 4 MG tablet Take 1 tablet (4 mg total) by mouth every 8 (eight) hours as needed for nausea or vomiting. 20 tablet 0   predniSONE (DELTASONE) 5 MG tablet Take 5 mg by mouth daily with breakfast.     RELION INSULIN SYRINGE 31G X 15/64" 0.5 ML MISC USE AS DIRECTED FOR INSULIN     sodium bicarbonate 650 MG tablet Take 1,300 mg by mouth 2 (two) times daily.     sulfamethoxazole-trimethoprim (BACTRIM) 400-80 MG tablet Take 1 tablet by mouth every Monday, Wednesday, and Friday.     tacrolimus (PROGRAF) 0.5 MG capsule Take 1 mg by mouth in the morning and at bedtime.     Vericiguat (VERQUVO) 2.5 MG TABS Take 2.5 mg by mouth daily.     No current facility-administered medications on file prior to visit.    ROS Review of Systems  Constitutional:  Negative for fever.  Respiratory:   Negative for shortness of breath.   Cardiovascular:  Negative for chest pain.  Musculoskeletal:  Negative for arthralgias.  Skin:  Negative for rash.    Objective:  BP (!) 160/77   Pulse (!) 59   Temp (!) 97.5 F (36.4 C)   Ht '5\' 10"'  (1.778 m)   Wt 209 lb (94.8 kg)   SpO2 97%   BMI 29.99 kg/m   BP Readings from Last 3 Encounters:  08/05/21 (!) 160/77  07/29/21 140/68  06/10/21 (!) 164/66    Wt Readings from Last 3 Encounters:  08/05/21 209 lb (94.8 kg)  07/29/21 208 lb 9.6 oz (94.6 kg)  06/10/21 209 lb 6.4 oz (95 kg)     Physical Exam Vitals reviewed.  Constitutional:      Appearance: He is well-developed.  HENT:     Head: Normocephalic and atraumatic.     Right Ear: External ear normal.     Left Ear: External ear normal.     Mouth/Throat:     Pharynx: No oropharyngeal exudate or posterior oropharyngeal erythema.  Eyes:     Pupils: Pupils are equal, round, and reactive to light.  Cardiovascular:     Rate and Rhythm: Normal rate and regular rhythm.     Heart sounds: No murmur heard. Pulmonary:     Effort: No respiratory distress.     Breath sounds: Normal breath sounds.  Musculoskeletal:     Cervical  back: Normal range of motion and neck supple.  Neurological:     Mental Status: He is alert and oriented to person, place, and time.     Diabetic Foot Exam - Simple   No data filed     Lab Results  Component Value Date   HGBA1C 6.8 (H) 08/05/2021   HGBA1C 7.7 (H) 04/30/2021   HGBA1C 7.2 (H) 11/05/2020    Assessment & Plan:   Daniel Myers was seen today for medical management of chronic issues.  Diagnoses and all orders for this visit:  Mixed hyperlipidemia -     Lipid panel  Essential hypertension -     CBC with Differential/Platelet -     CMP14+EGFR  Insulin dependent type 2 diabetes mellitus (HCC) -     Bayer DCA Hb A1c Waived -     insulin NPH Human (NOVOLIN N) 100 UNIT/ML injection; 10 units AC breakfast and 10 units AC supper  Insomnia,  unspecified type -     ALPRAZolam (XANAX) 0.25 MG tablet; Take 1 tablet (0.25 mg total) by mouth at bedtime as needed for anxiety.  Controlled substance agreement signed -     ToxASSURE Select 13 (MW), Urine  Kidney transplant recipient -     Tacrolimus Level  Other orders -     ezetimibe (ZETIA) 10 MG tablet; Take 1 tablet (10 mg total) by mouth daily. -     metoprolol succinate (TOPROL-XL) 100 MG 24 hr tablet; Take 1 tablet (100 mg total) by mouth daily. Take with or immediately following a meal.   I have changed Daniel Myers metoprolol succinate. I am also having him maintain his tacrolimus, sodium bicarbonate, mycophenolate, ReliOn Insulin Syringe, loratadine, albuterol, predniSONE, ondansetron, fluticasone, acetaminophen, amiodarone, nitroGLYCERIN, apixaban, sulfamethoxazole-trimethoprim, Verquvo, atorvastatin, furosemide, dapagliflozin propanediol, insulin regular, lisinopril, ALPRAZolam, ezetimibe, and insulin NPH Human.  Meds ordered this encounter  Medications   ALPRAZolam (XANAX) 0.25 MG tablet    Sig: Take 1 tablet (0.25 mg total) by mouth at bedtime as needed for anxiety.    Dispense:  90 tablet    Refill:  1   ezetimibe (ZETIA) 10 MG tablet    Sig: Take 1 tablet (10 mg total) by mouth daily.    Dispense:  30 tablet    Refill:  11   insulin NPH Human (NOVOLIN N) 100 UNIT/ML injection    Sig: 10 units AC breakfast and 10 units AC supper    Dispense:  30 mL    Refill:  11   metoprolol succinate (TOPROL-XL) 100 MG 24 hr tablet    Sig: Take 1 tablet (100 mg total) by mouth daily. Take with or immediately following a meal.    Dispense:  90 tablet    Refill:  3     Follow-up: Return in about 3 months (around 11/05/2021) for diabetes.  Claretta Fraise, M.D.

## 2021-08-07 LAB — LIPID PANEL
Chol/HDL Ratio: 3 ratio (ref 0.0–5.0)
Cholesterol, Total: 103 mg/dL (ref 100–199)
HDL: 34 mg/dL — ABNORMAL LOW (ref >39)
LDL Chol Calc (NIH): 50 mg/dL (ref 0–99)
Triglycerides: 99 mg/dL (ref 0–149)
VLDL Cholesterol Cal: 19 mg/dL (ref 5–40)

## 2021-08-07 LAB — CBC WITH DIFFERENTIAL/PLATELET
Basophils Absolute: 0 10*3/uL (ref 0.0–0.2)
Basos: 0 %
EOS (ABSOLUTE): 0.2 10*3/uL (ref 0.0–0.4)
Eos: 3 %
Hematocrit: 42.3 % (ref 37.5–51.0)
Hemoglobin: 13.6 g/dL (ref 13.0–17.7)
Lymphocytes Absolute: 1.1 10*3/uL (ref 0.7–3.1)
Lymphs: 18 %
MCH: 28.3 pg (ref 26.6–33.0)
MCHC: 32.2 g/dL (ref 31.5–35.7)
MCV: 88 fL (ref 79–97)
Monocytes Absolute: 0.6 10*3/uL (ref 0.1–0.9)
Monocytes: 10 %
Neutrophils Absolute: 4.3 10*3/uL (ref 1.4–7.0)
Neutrophils: 69 %
Platelets: 149 10*3/uL — ABNORMAL LOW (ref 150–450)
RBC: 4.8 x10E6/uL (ref 4.14–5.80)
RDW: 13.6 % (ref 11.6–15.4)
WBC: 6.2 10*3/uL (ref 3.4–10.8)

## 2021-08-07 LAB — CMP14+EGFR
ALT: 16 IU/L (ref 0–44)
AST: 22 IU/L (ref 0–40)
Albumin/Globulin Ratio: 2 (ref 1.2–2.2)
Albumin: 4.3 g/dL (ref 3.7–4.7)
Alkaline Phosphatase: 63 IU/L (ref 44–121)
BUN/Creatinine Ratio: 18 (ref 10–24)
BUN: 31 mg/dL — ABNORMAL HIGH (ref 8–27)
Bilirubin Total: 0.7 mg/dL (ref 0.0–1.2)
CO2: 24 mmol/L (ref 20–29)
Calcium: 8.6 mg/dL (ref 8.6–10.2)
Chloride: 99 mmol/L (ref 96–106)
Creatinine, Ser: 1.71 mg/dL — ABNORMAL HIGH (ref 0.76–1.27)
Globulin, Total: 2.1 g/dL (ref 1.5–4.5)
Glucose: 114 mg/dL — ABNORMAL HIGH (ref 70–99)
Potassium: 4.3 mmol/L (ref 3.5–5.2)
Sodium: 137 mmol/L (ref 134–144)
Total Protein: 6.4 g/dL (ref 6.0–8.5)
eGFR: 41 mL/min/{1.73_m2} — ABNORMAL LOW (ref >59)

## 2021-08-07 LAB — TACROLIMUS LEVEL: Tacrolimus (FK506), Blood: 4.5 ng/mL (ref 2.0–20.0)

## 2021-08-07 NOTE — Progress Notes (Signed)
Hello [place],  Your lab result is normal and/or stable.Some minor variations that are not significant are commonly marked abnormal, but do not represent any medical problem for you.  Best regards, Claretta Fraise, M.D.

## 2021-08-10 LAB — TOXASSURE SELECT 13 (MW), URINE

## 2021-08-11 ENCOUNTER — Encounter: Payer: Self-pay | Admitting: Family Medicine

## 2021-08-17 ENCOUNTER — Encounter: Payer: Self-pay | Admitting: Cardiology

## 2021-08-17 ENCOUNTER — Ambulatory Visit (INDEPENDENT_AMBULATORY_CARE_PROVIDER_SITE_OTHER): Payer: Medicare Other | Admitting: Dermatology

## 2021-08-17 ENCOUNTER — Other Ambulatory Visit: Payer: Self-pay | Admitting: *Deleted

## 2021-08-17 DIAGNOSIS — C44329 Squamous cell carcinoma of skin of other parts of face: Secondary | ICD-10-CM | POA: Diagnosis not present

## 2021-08-17 DIAGNOSIS — C4442 Squamous cell carcinoma of skin of scalp and neck: Secondary | ICD-10-CM | POA: Diagnosis not present

## 2021-08-17 DIAGNOSIS — D0581 Other specified type of carcinoma in situ of right breast: Secondary | ICD-10-CM | POA: Diagnosis not present

## 2021-08-17 DIAGNOSIS — C44629 Squamous cell carcinoma of skin of left upper limb, including shoulder: Secondary | ICD-10-CM | POA: Diagnosis not present

## 2021-08-17 DIAGNOSIS — C4492 Squamous cell carcinoma of skin, unspecified: Secondary | ICD-10-CM

## 2021-08-17 DIAGNOSIS — D485 Neoplasm of uncertain behavior of skin: Secondary | ICD-10-CM

## 2021-08-17 HISTORY — DX: Squamous cell carcinoma of skin, unspecified: C44.92

## 2021-08-17 MED ORDER — EZETIMIBE 10 MG PO TABS: 10.0000 mg | ORAL_TABLET | Freq: Every day | ORAL | 3 refills | Status: AC

## 2021-08-20 ENCOUNTER — Encounter: Payer: Self-pay | Admitting: Dermatology

## 2021-08-24 ENCOUNTER — Telehealth: Payer: Self-pay

## 2021-08-24 NOTE — Telephone Encounter (Signed)
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

## 2021-08-24 NOTE — Telephone Encounter (Signed)
Phone call to patient's daughter [name] with his pathology results. Patient's daughter aware of results.

## 2021-08-29 ENCOUNTER — Other Ambulatory Visit (HOSPITAL_COMMUNITY): Payer: Self-pay | Admitting: Cardiology

## 2021-08-31 ENCOUNTER — Ambulatory Visit: Payer: Self-pay | Admitting: *Deleted

## 2021-08-31 DIAGNOSIS — I10 Essential (primary) hypertension: Secondary | ICD-10-CM

## 2021-08-31 NOTE — Chronic Care Management (AMB) (Signed)
  Chronic Care Management   Note  08/31/2021 Name: Daniel Myers MRN: [id number] DOB: 03-31-1945   Patient has either met RN Care Management goals, is stable from [place] Management perspective, or has not recently engaged with the RN Care Manager. I am removing RN Care Manager from Care Team and closing [place]. If patient is currently engaged with another CCM team member I will forward this encounter to inform them of my case closure. Patient may be eligible for re-engagement with RN Care Manager in the future if necessary and can discuss this with their PCP.  [name], BSN, RN-BC Embedded Chronic Care Manager [facility] / [place] Management Direct Dial: [phone]

## 2021-08-31 NOTE — Patient Instructions (Signed)
Daniel Myers  I have previously worked with you through the Chronic Care Management Program at [facility]. Due to program changes I am removing myself from your care team because you've either met our goals, your conditions are stable and no longer require care management, or we haven't engaged within the past 6 months. If you are currently active with another CCM Team Member, you will remain active with them unless they reach out to you with additional information. If you feel that you need RN Care Management services in the future, please talk with your primary care provider to discuss re-engagement with the RN Care Manager that will be assigned to [facility]. This does not affect your status as a patient at [place].   Thank you for allowing me to participate in your your healthcare journey.  [name], BSN, RN-BC Embedded Chronic Care Manager [facility] / [place] Management Direct Dial: [phone]

## 2021-09-05 ENCOUNTER — Encounter: Payer: Self-pay | Admitting: Dermatology

## 2021-09-05 NOTE — Progress Notes (Signed)
Follow-Up Visit   Subjective  Daniel Myers is a 76 y.o. male who presents for the following: Skin Problem (Patient has a few lesions one on his left cheek  x 2 months. Also check lesion on left arm. History of non mole skin cancer. ).  Multiple areas with enlarging crusts in kidney transplant patient with chronic immunosuppression Location:  Duration:  Quality:  Associated Signs/Symptoms: Modifying Factors:  Severity:  Timing: Context:   Objective  Well appearing patient in no apparent distress; mood and affect are within normal limits. Left Malar Cheek Angiulli eroded 1.6 cm KA/SCCA like nodule       Right Breast Waxy 1 cm pink nodule, carcinoma       Neck - Anterior Eroded 9 mm pink crust, carcinoma       Left Forearm - Anterior 1 cm volcano shaped nodule suggestive of SCCA/KA       All skin waist up examined.   Assessment & Plan    Neoplasm of uncertain behavior of skin (3) Left Malar Cheek  Skin / nail biopsy Type of biopsy: tangential   Informed consent: discussed and consent obtained   Timeout: patient name, date of birth, surgical site, and procedure verified   Anesthesia: the lesion was anesthetized in a standard fashion   Anesthetic:  1% lidocaine w/ epinephrine 1-100,000 local infiltration Instrument used: flexible razor blade   Hemostasis achieved with: ferric subsulfate   Outcome: patient tolerated procedure well   Post-procedure details: wound care instructions given    Specimen 1 - Surgical pathology Differential Diagnosis: KA  Check Margins: No  Right Breast  Skin / nail biopsy Type of biopsy: tangential   Informed consent: discussed and consent obtained   Timeout: patient name, date of birth, surgical site, and procedure verified   Anesthesia: the lesion was anesthetized in a standard fashion   Anesthetic:  1% lidocaine w/ epinephrine 1-100,000 local infiltration Instrument used: flexible razor blade   Hemostasis  achieved with: ferric subsulfate   Outcome: patient tolerated procedure well   Post-procedure details: wound care instructions given    Specimen 2 - Surgical pathology Differential Diagnosis: KA  Check Margins: No  Neck - Anterior  Skin / nail biopsy Type of biopsy: tangential   Informed consent: discussed and consent obtained   Timeout: patient name, date of birth, surgical site, and procedure verified   Anesthesia: the lesion was anesthetized in a standard fashion   Anesthetic:  1% lidocaine w/ epinephrine 1-100,000 local infiltration Instrument used: flexible razor blade   Hemostasis achieved with: ferric subsulfate   Outcome: patient tolerated procedure well   Post-procedure details: wound care instructions given    Specimen 4 - Surgical pathology Differential Diagnosis: scc vs bcc  Check Margins: No  Squamous cell carcinoma of arm, left Left Forearm - Anterior  Skin / nail biopsy Type of biopsy: tangential   Informed consent: discussed and consent obtained   Timeout: patient name, date of birth, surgical site, and procedure verified   Anesthesia: the lesion was anesthetized in a standard fashion   Anesthetic:  1% lidocaine w/ epinephrine 1-100,000 local infiltration Instrument used: flexible razor blade   Hemostasis achieved with: ferric subsulfate   Outcome: patient tolerated procedure well   Post-procedure details: wound care instructions given    Destruction of lesion Complexity: simple   Destruction method: electrodesiccation and curettage   Informed consent: discussed and consent obtained   Timeout:  patient name, date of birth, surgical site, and procedure verified  Anesthesia: the lesion was anesthetized in a standard fashion   Anesthetic:  1% lidocaine w/ epinephrine 1-100,000 local infiltration Curettage performed in three different directions: Yes   Curettage cycles:  3 Lesion length (cm):  1.5 Lesion width (cm):  1.5 Margin per side (cm):  0 Final  wound size (cm):  1.5 Hemostasis achieved with:  aluminum chloride Outcome: patient tolerated procedure well with no complications   Post-procedure details: wound care instructions given    Specimen 3 - Surgical pathology Differential Diagnosis: Scc vs bcc  Check Margins: No  After shave biopsy the base was treated with curettage plus cautery      I, Lavonna Monarch, MD, have reviewed all documentation for this visit.  The documentation on 09/05/21 for the exam, diagnosis, procedures, and orders are all accurate and complete.

## 2021-09-19 ENCOUNTER — Other Ambulatory Visit (HOSPITAL_COMMUNITY): Payer: Self-pay | Admitting: Cardiology

## 2021-10-14 ENCOUNTER — Telehealth (HOSPITAL_COMMUNITY): Payer: Self-pay | Admitting: Cardiology

## 2021-10-14 ENCOUNTER — Ambulatory Visit (HOSPITAL_COMMUNITY)
Admission: RE | Admit: 2021-10-14 | Discharge: 2021-10-14 | Disposition: A | Discharge status: Home / Self Care | Payer: Medicare Other | Source: Ambulatory Visit | Attending: Cardiology | Admitting: Cardiology

## 2021-10-14 ENCOUNTER — Encounter (HOSPITAL_COMMUNITY): Payer: Self-pay | Admitting: Cardiology

## 2021-10-14 VITALS — BP 102/50 | HR 58 | Wt 211.6 lb

## 2021-10-14 DIAGNOSIS — I2510 Atherosclerotic heart disease of native coronary artery without angina pectoris: Secondary | ICD-10-CM | POA: Insufficient documentation

## 2021-10-14 DIAGNOSIS — I214 Non-ST elevation (NSTEMI) myocardial infarction: Secondary | ICD-10-CM | POA: Diagnosis not present

## 2021-10-14 DIAGNOSIS — I130 Hypertensive heart and chronic kidney disease with heart failure and stage 1 through stage 4 chronic kidney disease, or unspecified chronic kidney disease: Secondary | ICD-10-CM | POA: Diagnosis not present

## 2021-10-14 DIAGNOSIS — I255 Ischemic cardiomyopathy: Secondary | ICD-10-CM | POA: Diagnosis not present

## 2021-10-14 DIAGNOSIS — D696 Thrombocytopenia, unspecified: Secondary | ICD-10-CM | POA: Insufficient documentation

## 2021-10-14 DIAGNOSIS — I4891 Unspecified atrial fibrillation: Secondary | ICD-10-CM | POA: Diagnosis not present

## 2021-10-14 DIAGNOSIS — I5042 Chronic combined systolic (congestive) and diastolic (congestive) heart failure: Secondary | ICD-10-CM | POA: Diagnosis present

## 2021-10-14 DIAGNOSIS — Z7984 Long term (current) use of oral hypoglycemic drugs: Secondary | ICD-10-CM | POA: Diagnosis not present

## 2021-10-14 DIAGNOSIS — E1122 Type 2 diabetes mellitus with diabetic chronic kidney disease: Secondary | ICD-10-CM | POA: Diagnosis not present

## 2021-10-14 DIAGNOSIS — Z7901 Long term (current) use of anticoagulants: Secondary | ICD-10-CM | POA: Insufficient documentation

## 2021-10-14 DIAGNOSIS — Z940 Kidney transplant status: Secondary | ICD-10-CM | POA: Diagnosis not present

## 2021-10-14 DIAGNOSIS — U099 Post covid-19 condition, unspecified: Secondary | ICD-10-CM | POA: Insufficient documentation

## 2021-10-14 DIAGNOSIS — Z8673 Personal history of transient ischemic attack (TIA), and cerebral infarction without residual deficits: Secondary | ICD-10-CM | POA: Insufficient documentation

## 2021-10-14 DIAGNOSIS — I4819 Other persistent atrial fibrillation: Secondary | ICD-10-CM | POA: Diagnosis not present

## 2021-10-14 DIAGNOSIS — N183 Chronic kidney disease, stage 3 unspecified: Secondary | ICD-10-CM | POA: Insufficient documentation

## 2021-10-14 DIAGNOSIS — G4733 Obstructive sleep apnea (adult) (pediatric): Secondary | ICD-10-CM | POA: Insufficient documentation

## 2021-10-14 DIAGNOSIS — I252 Old myocardial infarction: Secondary | ICD-10-CM | POA: Insufficient documentation

## 2021-10-14 DIAGNOSIS — Z79899 Other long term (current) drug therapy: Secondary | ICD-10-CM | POA: Diagnosis not present

## 2021-10-14 DIAGNOSIS — E785 Hyperlipidemia, unspecified: Secondary | ICD-10-CM | POA: Diagnosis not present

## 2021-10-14 DIAGNOSIS — Z85828 Personal history of other malignant neoplasm of skin: Secondary | ICD-10-CM | POA: Diagnosis not present

## 2021-10-14 LAB — CBC
HCT: 34.1 % — ABNORMAL LOW (ref 39.0–52.0)
Hemoglobin: 11.2 g/dL — ABNORMAL LOW (ref 13.0–17.0)
MCH: 29.3 pg (ref 26.0–34.0)
MCHC: 32.8 g/dL (ref 30.0–36.0)
MCV: 89.3 fL (ref 80.0–100.0)
Platelets: 113 10*3/uL — ABNORMAL LOW (ref 150–400)
RBC: 3.82 MIL/uL — ABNORMAL LOW (ref 4.22–5.81)
RDW: 14.4 % (ref 11.5–15.5)
WBC: 5.4 10*3/uL (ref 4.0–10.5)
nRBC: 0 % (ref 0.0–0.2)

## 2021-10-14 LAB — COMPREHENSIVE METABOLIC PANEL
ALT: 18 U/L (ref 0–44)
AST: 19 U/L (ref 15–41)
Albumin: 3.6 g/dL (ref 3.5–5.0)
Alkaline Phosphatase: 50 U/L (ref 38–126)
Anion gap: 8 (ref 5–15)
BUN: 46 mg/dL — ABNORMAL HIGH (ref 8–23)
CO2: 22 mmol/L (ref 22–32)
Calcium: 8.5 mg/dL — ABNORMAL LOW (ref 8.9–10.3)
Chloride: 107 mmol/L (ref 98–111)
Creatinine, Ser: 2.47 mg/dL — ABNORMAL HIGH (ref 0.61–1.24)
GFR, Estimated: 27 mL/min — ABNORMAL LOW (ref >60)
Glucose, Bld: 79 mg/dL (ref 70–99)
Potassium: 5 mmol/L (ref 3.5–5.1)
Sodium: 137 mmol/L (ref 135–145)
Total Bilirubin: 0.7 mg/dL (ref 0.3–1.2)
Total Protein: 6.1 g/dL — ABNORMAL LOW (ref 6.5–8.1)

## 2021-10-14 LAB — BRAIN NATRIURETIC PEPTIDE: B Natriuretic Peptide: 789.1 pg/mL — ABNORMAL HIGH (ref 0.0–100.0)

## 2021-10-14 LAB — TSH: TSH: 5.055 u[IU]/mL — ABNORMAL HIGH (ref 0.350–4.500)

## 2021-10-14 MED ORDER — AMIODARONE HCL 200 MG PO TABS: 100.0000 mg | ORAL_TABLET | Freq: Every day | ORAL | 3 refills | Status: AC

## 2021-10-14 MED ORDER — FUROSEMIDE 20 MG PO TABS: 20.0000 mg | ORAL_TABLET | ORAL | 3 refills | Status: DC

## 2021-10-14 MED ORDER — SPIRONOLACTONE 25 MG PO TABS: 12.5000 mg | ORAL_TABLET | Freq: Every day | ORAL | 3 refills | Status: DC

## 2021-10-14 NOTE — Telephone Encounter (Signed)
Patient called.  Patient aware via daughter Daniel Myers Pt has order to repeat labs in 7-10 days at local office will repeat labs at that time

## 2021-10-14 NOTE — Telephone Encounter (Signed)
-----   Message from Larey Dresser, MD sent at 10/14/2021  3:45 PM EDT ----- K and creatinine higher.  Would NOT start spironolactone (ordered at visit today).  Also, would have him decrease Lasix to 20 mg every other day.  Follow low K diet.  Repeat BMET 1 week.

## 2021-10-14 NOTE — Progress Notes (Signed)
PCP: Claretta Fraise, MD Nephrology: Dr. Joseph Berkshire Cardiology: Dr. Domenic Polite HF Cardiology: Dr. Aundra Dubin  76 y.o. with history of suspected CAD/NSTEMI, CKD stage 3 s/p renal transplant, chronic systolic CHF, and persistent atrial fibrillation was referred by Dr. Domenic Polite for evaluation of CHF, atrial fibrillation, and suspected CAD.  Patient was doing fairly well until 1/22.  He developed COVID-19 PNA.  Hospitalization was complicated by NSTEMI and the onset of atrial fibrillation.  Cath was not done with elevated creatinine.  Echo in 1/22 showed EF 40-45% with wall motion abnormalities.  Cardiolite done as an outpatient in 3/22 showed EF 21%, inferior and inferolateral fixed defect.    Patient had CVA in 2/22, likely related to atrial fibrillation.  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  He did PT.   Repeat echo in 4/22 showed EF down further to 25-30%.  RHC/LHC was done in 8/22.  This showed severe 3VD with occluded RCA with collaterals, 99% proximal-mid LAD stenosis, 80% ostial LCx, significant disease in OMs and diagonals.  RHC showed normal filling pressures and preserved cardiac output.   He saw Dr. Cyndia Bent and decided that he did not want to undergo CABG.  Dr. Aundra Dubin reviewed the cath films with Dr. Burt Knack. Could potentially intervene on LAD, but would essentially be a CTO procedure with increased risk to the patient (including worsening renal function).  Based on recently released REVIVED-BCIS trial, do not think that Daniel Myers would derive much benefit from PCI in this situation in the absence of significant angina (which he does not seem to have).   In 9/22, he had DCCV back to NSR.  HR in 40s at rest.   Follow up 1/23, he stopped Entresto due to light headedness. Unable to start low dose ARB due to elevated renal function. Nephrology stopped spironolactone 1/23. PCP started lisinopril 3/23.  He returns for followup of CHF today.  Weight is up 3 lbs.  He is not short of breath  walking on flat ground.  No orthopnea/PND.  No lightheadedness.  No chest pain.  No BRBPR/melena.   Labs (6/22): LDL 92, HDL 40, K 5.1, creatinine 1.7 Labs (8/22): K 5.4, creatinine 2.16, LFTs normal, TSH normal, plts 123, hgb 15.2 Labs (9/22): K 4.7, creatinine 2.11 Labs (11/22): K 4.9, creatinine 2.34, LDL 52, TGs 128, BNP 573 Labs (3/23): K 4.7, creatinine 2.34, LDL 54, HDL 34 Labs (5/23): K 4.2, creatinine 1.83 Labs (6/23): K 4.3, creatinine 1.7, LDL 50  ECG (personally reviewed): sinus arrhythmia rate 48s, old inferior MI, old anterolateral MI  PMH: 1. Type 2 diabetes 2. HTN 3. Hyperlipidemia 4. H/o COVID-19 PNA in 1/22.  5. CKD: Stage 3.  History of renal transplant.   6. 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  7. Thrombocytopenia 8. Skin cancer 9. OSA 10. CAD: NSTEMI in 1/22 during admission for COVID PNA.  HS-TnI to 8647.  Not cathed with elevated creatinine.  - Cardiolite (3/22): EF 21%, inferior and inferolateral fixed defect, apical fixed defect.  - LHC (8/22): Severe 3VD with occluded RCA with collaterals, 99% proximal-mid LAD stenosis, 80% ostial LCx, significant disease in OMs and diagonals. 11. Chronic systolic CHF: Ischemic cardiomyopathy.  - Echo (1/22): EF 40-45%, wall motion abnormalities.  - Echo (4/22): EF 25-30%, global hypokinesis.  - RHC/LHC (8/22): severe 3VD with occluded RCA with collaterals, 99% proximal-mid LAD stenosis, 80% ostial LCx, significant disease in OMs and diagonals. Mean RA 2, PA 21/7, mean PCWP 11, CI  2.2 Fick/2.33 thermodilution 12. Atrial fibrillation: Persistent since 1/22. DCCV to NSR in 9/22.   Social History   Socioeconomic History   Marital status: Widowed    Spouse name: Not on file   Number of children: 3   Years of education: Not on file   Highest education level: Not on file  Occupational History   Not on file  Tobacco Use   Smoking status: Some Days    Types: Pipe   Smokeless tobacco: Never   Vaping Use   Vaping Use: Never used  Substance and Sexual Activity   Alcohol use: Never   Drug use: Never   Sexual activity: Not Currently  Other Topics Concern   Not on file  Social History Narrative   Wife passed away in 05-18-2007.   2 daughters, 1 son. All live close by.    3 grandchildren.    Social Determinants of Health   Financial Resource Strain: Low Risk  (04/15/2021)   Overall Financial Resource Strain (CARDIA)    Difficulty of Paying Living Expenses: Not hard at all  Food Insecurity: No Food Insecurity (04/15/2021)   Hunger Vital Sign    Worried About Running Out of Food in the Last Year: Never true    Ran Out of Food in the Last Year: Never true  Transportation Needs: No Transportation Needs (04/15/2021)   PRAPARE - Hydrologist (Medical): No    Lack of Transportation (Non-Medical): No  Physical Activity: Insufficiently Active (04/15/2021)   Exercise Vital Sign    Days of Exercise per Week: 7 days    Minutes of Exercise per Session: 20 min  Stress: No Stress Concern Present (04/15/2021)   [place]    Feeling of Stress : Not at all  Social Connections: Moderately Integrated (04/15/2021)   Social Connection and Isolation Panel [NHANES]    Frequency of Communication with Friends and Family: More than three times a week    Frequency of Social Gatherings with Friends and Family: More than three times a week    Attends Religious Services: 1 to 4 times per year    Active Member of [employer] or Organizations: No    Attends Archivist Meetings: 1 to 4 times per year    Marital Status: Widowed  Intimate Partner Violence: Not At Risk (04/15/2021)   Humiliation, Afraid, Rape, and Kick questionnaire    Fear of Current or Ex-Partner: No    Emotionally Abused: No    Physically Abused: No    Sexually Abused: No   Family History  Problem Relation Age of Onset   Clotting disorder Mother     Hypertension Sister    Diabetes Sister    ROS: All systems reviewed and negative except as per HPI.   Current Outpatient Medications  Medication Sig Dispense Refill   acetaminophen (TYLENOL) 500 MG tablet Take 500 mg by mouth every 8 (eight) hours as needed for moderate pain.     albuterol (VENTOLIN HFA) 108 (90 Base) MCG/ACT inhaler Inhale 2 puffs into the lungs every 6 (six) hours as needed for wheezing or shortness of breath. 1 each 3   ALPRAZolam (XANAX) 0.25 MG tablet Take 1 tablet (0.25 mg total) by mouth at bedtime as needed for anxiety. 90 tablet 1   apixaban (ELIQUIS) 5 MG TABS tablet Take 1 tablet (5 mg total) by mouth 2 (two) times daily. 180 tablet 3   atorvastatin (LIPITOR) 40 MG  tablet Take 1 tablet (40 mg total) by mouth daily. 90 tablet 3   dapagliflozin propanediol (FARXIGA) 10 MG TABS tablet Take 1 tablet (10 mg total) by mouth daily before breakfast. 90 tablet 3   ezetimibe (ZETIA) 10 MG tablet Take 1 tablet (10 mg total) by mouth daily. 90 tablet 3   fluticasone (FLONASE) 50 MCG/ACT nasal spray USE ONE SPRAY(S) IN EACH NOSTRIL ONCE DAILY 16 g 11   insulin NPH Human (NOVOLIN N) 100 UNIT/ML injection 10 units AC breakfast and 10 units AC supper 30 mL 11   insulin regular (NOVOLIN R) 100 units/mL injection 16 units w bkfst and 13 units w dinner 10 mL 5   lisinopril (ZESTRIL) 5 MG tablet Take 1 tablet (5 mg total) by mouth daily. 90 tablet 1   loratadine (CLARITIN) 10 MG tablet Take 10 mg by mouth daily as needed for allergies.     metoprolol succinate (TOPROL-XL) 100 MG 24 hr tablet Take 1 tablet (100 mg total) by mouth daily. Take with or immediately following a meal. 90 tablet 3   mycophenolate (CELLCEPT) 250 MG capsule Take 500 mg by mouth 2 (two) times daily.     nitroGLYCERIN (NITROSTAT) 0.4 MG SL tablet Place 1 tablet (0.4 mg total) under the tongue every 5 (five) minutes as needed for chest pain. 25 tablet 3   ondansetron (ZOFRAN) 4 MG tablet Take 1 tablet (4 mg  total) by mouth every 8 (eight) hours as needed for nausea or vomiting. 20 tablet 0   predniSONE (DELTASONE) 5 MG tablet Take 5 mg by mouth daily with breakfast.     RELION INSULIN SYRINGE 31G X 15/64" 0.5 ML MISC USE AS DIRECTED FOR INSULIN     sodium bicarbonate 650 MG tablet Take 1,300 mg by mouth 2 (two) times daily.     sulfamethoxazole-trimethoprim (BACTRIM) 400-80 MG tablet Take 1 tablet by mouth every Monday, Wednesday, and Friday.     tacrolimus (PROGRAF) 0.5 MG capsule Take 1 mg by mouth in the morning and at bedtime.     Vericiguat (VERQUVO) 2.5 MG TABS Take 2.5 mg by mouth daily.     amiodarone (PACERONE) 200 MG tablet Take 0.5 tablets (100 mg total) by mouth daily. 45 tablet 3   furosemide (LASIX) 20 MG tablet Take 1 tablet (20 mg total) by mouth every other day. 90 tablet 3   No current facility-administered medications for this encounter.   Wt Readings from Last 3 Encounters:  10/14/21 96 kg (211 lb 9.6 oz)  08/05/21 94.8 kg (209 lb)  07/29/21 94.6 kg (208 lb 9.6 oz)   BP (!) 102/50   Pulse (!) 58   Wt 96 kg (211 lb 9.6 oz)   SpO2 98%   BMI 30.36 kg/m  General: NAD Neck: JVP 8 cm, no thyromegaly or thyroid nodule.  Lungs: Clear to auscultation bilaterally with normal respiratory effort. CV: Nondisplaced PMI.  Heart regular S1/S2, no S3/S4, 1/6 HSM LLSB.  1+ ankle edema.  No carotid bruit.  Normal pedal pulses.  Abdomen: Soft, nontender, no hepatosplenomegaly, no distention.  Skin: Intact without lesions or rashes.  Neurologic: Alert and oriented x 3.  Psych: Normal affect. Extremities: No clubbing or cyanosis.  HEENT: Normal.   Assessment/Plan: 1. Chronic systolic CHF: Echo in 2/09 with EF 25-30%.   Patient had NSTEMI during 1/22 admission with COVID-19, not cathed with CKD.  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 RHC in 8/22 with preserved cardiac output and low filling pressures.  NYHA class II. Minimal volume overload on exam. Medication titration limited by  CKD, orthostatic symptoms, and bradycardia.   - Continue Lasix 20 mg daily. BMET/BNP today. - Continue lisinopril 5 mg daily (did not tolerate Entresto due to lightheadedness).  - Continue Toprol XL 100 mg daily.  - Continue Farxiga 10 mg daily. - Continue vericiguat 2.5 mg daily, did not tolerate 5 mg daily.  - Restart spironolactone at 12.5 mg daily, follow low K diet.  BMET 10 days.  - He says that he would not be interested in ICD.  - I am going to arrange for repeat echo.  2. CAD: Cath in 8/22 with severe 3VD, occluded RCA with collaterals, 99% proximal-mid LAD stenosis, 80% ostial LCx, significant disease in OMs and diagonals.  With low EF and 3VD, I recommended CABG.  He saw Dr. Cyndia Bent, and is adamant that he does not want to have surgery.  Dr. Cyndia Bent wanted him to get an MRI for viability, but if he is not going to have CABG, would not get MRI.  I reviewed his cath films with Dr. Burt Knack for PCI options.  Could potentially intervene on LAD, but would essentially be a CTO procedure with increased risk to the patient (including worsening renal function).  Based on recent REVIVED-BCIS trial, I do not think that Daniel Myers would derive much benefit from PCI in this situation in the absence of significant angina (which he does not have currently).  No chest pain.  - Continue atorvastatin, good lipids in 3/23.  - He is not on ASA due to Eliquis use.  3. Atrial fibrillation: DCCV to NSR in 9/22. NSR today.  - Decrease amiodarone to 100 mg daily.  Check LFTs and TSH, he will need regular eye exam while on amiodarone.  - Continue Eliquis 5 mg bid.  CBC today.  - We have discussed AF ablation, but he is not interested in invasive procedures.   4. 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  5. CKD: stage 3.  Post-transplant.  Followed by nephrology, Dr. Joseph Berkshire. - BMET today. Follow kidney function closely with diuresis.  6. HTN: BP not elevated.   Followup 3 months with APP.   Loralie Champagne  10/14/2021

## 2021-10-14 NOTE — Patient Instructions (Addendum)
Start Spironolactone 125.mg ( 1/2 Tab) daily  Decrease Amiodarone to 100 mg daily  Labs done today, your results will be available in MyChart, we will contact you for abnormal readings.  Repeat blood work in 10 days   Your physician has requested that you have an echocardiogram. Echocardiography is a painless test that uses sound waves to create images of your heart. It provides your doctor with information about the size and shape of your heart and how well your heart's chambers and valves are working. This procedure takes approximately one hour. There are no restrictions for this procedure.  Your physician recommends that you schedule a follow-up appointment in: 3 months  If you have any questions or concerns before your next appointment please send [place] a message through [place] or call our office at [phone].    TO LEAVE A MESSAGE FOR THE NURSE SELECT OPTION 2, PLEASE LEAVE A MESSAGE INCLUDING: YOUR NAME DATE OF BIRTH CALL BACK NUMBER REASON FOR CALL**this is important as we prioritize the call backs  YOU WILL RECEIVE A CALL BACK THE SAME DAY AS LONG AS YOU CALL BEFORE 4:00 PM  At the [facility], you and your health needs are our priority. As part of our continuing mission to provide you with exceptional heart care, we have created designated Provider Care Teams. These Care Teams include your primary Cardiologist (physician) and Advanced Practice Providers (APPs- Physician Assistants and Nurse Practitioners) who all work together to provide you with the care you need, when you need it.   You may see any of the following providers on your designated Care Team at your next follow up: Dr Glori Bickers Dr Loralie Champagne Dr. Roxana Hires, NP Lyda Jester, [place] [facility] [place], [place] Forestine Na, NP Audry Riles, PharmD   Please be sure to bring in all your medications bottles to every appointment.

## 2021-10-15 ENCOUNTER — Encounter (HOSPITAL_COMMUNITY): Payer: Self-pay

## 2021-10-21 ENCOUNTER — Other Ambulatory Visit: Payer: Medicare Other

## 2021-10-27 ENCOUNTER — Encounter (HOSPITAL_COMMUNITY): Payer: Self-pay

## 2021-10-27 ENCOUNTER — Other Ambulatory Visit (HOSPITAL_COMMUNITY): Payer: Self-pay | Admitting: *Deleted

## 2021-10-27 ENCOUNTER — Ambulatory Visit (HOSPITAL_COMMUNITY)
Admission: RE | Admit: 2021-10-27 | Discharge: 2021-10-27 | Disposition: A | Discharge status: Home / Self Care | Payer: Medicare Other | Source: Ambulatory Visit | Attending: Cardiology | Admitting: Cardiology

## 2021-10-27 ENCOUNTER — Telehealth (HOSPITAL_COMMUNITY): Payer: Self-pay

## 2021-10-27 ENCOUNTER — Ambulatory Visit (HOSPITAL_COMMUNITY)
Admission: RE | Admit: 2021-10-27 | Discharge: 2021-10-27 | Disposition: A | Discharge status: Home / Self Care | Payer: Medicare Other | Source: Ambulatory Visit | Attending: Family Medicine | Admitting: Family Medicine

## 2021-10-27 DIAGNOSIS — I5042 Chronic combined systolic (congestive) and diastolic (congestive) heart failure: Secondary | ICD-10-CM

## 2021-10-27 LAB — BASIC METABOLIC PANEL
Anion gap: 7 (ref 5–15)
BUN: 45 mg/dL — ABNORMAL HIGH (ref 8–23)
CO2: 22 mmol/L (ref 22–32)
Calcium: 8.7 mg/dL — ABNORMAL LOW (ref 8.9–10.3)
Chloride: 110 mmol/L (ref 98–111)
Creatinine, Ser: 2.3 mg/dL — ABNORMAL HIGH (ref 0.61–1.24)
GFR, Estimated: 29 mL/min — ABNORMAL LOW (ref >60)
Glucose, Bld: 101 mg/dL — ABNORMAL HIGH (ref 70–99)
Potassium: 5.5 mmol/L — ABNORMAL HIGH (ref 3.5–5.1)
Sodium: 139 mmol/L (ref 135–145)

## 2021-10-27 MED ORDER — LOKELMA 10 G PO PACK: 10.0000 g | PACK | ORAL | 0 refills | Status: DC

## 2021-10-27 NOTE — Telephone Encounter (Signed)
Patient advised and verbalized understanding. Rx sent into patients pharmacy. Patient will have labs done at [facility].

## 2021-10-27 NOTE — Telephone Encounter (Signed)
-----   Message from Larey Dresser, MD sent at 10/27/2021  4:20 PM EDT ----- K is still high.  He should be off spironolactone.  Would give Lokelma 10 g x 1 then have him follow low K diet.  BMET 1 week.

## 2021-10-28 ENCOUNTER — Other Ambulatory Visit (HOSPITAL_COMMUNITY): Payer: Self-pay

## 2021-10-29 ENCOUNTER — Emergency Department (HOSPITAL_COMMUNITY): Payer: Medicare Other

## 2021-10-29 ENCOUNTER — Other Ambulatory Visit: Payer: Self-pay

## 2021-10-29 ENCOUNTER — Emergency Department (HOSPITAL_COMMUNITY)
Admission: EM | Admit: 2021-10-29 | Discharge: 2021-10-30 | Disposition: A | Discharge status: Home / Self Care | Payer: Medicare Other | Attending: Emergency Medicine | Admitting: Emergency Medicine

## 2021-10-29 ENCOUNTER — Encounter (HOSPITAL_COMMUNITY): Payer: Self-pay

## 2021-10-29 ENCOUNTER — Ambulatory Visit (INDEPENDENT_AMBULATORY_CARE_PROVIDER_SITE_OTHER): Payer: Medicare Other | Admitting: Family Medicine

## 2021-10-29 ENCOUNTER — Encounter: Payer: Self-pay | Admitting: Family Medicine

## 2021-10-29 VITALS — BP 130/66 | HR 70 | Temp 98.8°F | Ht 70.0 in | Wt 212.0 lb

## 2021-10-29 DIAGNOSIS — W19XXXA Unspecified fall, initial encounter: Secondary | ICD-10-CM | POA: Insufficient documentation

## 2021-10-29 DIAGNOSIS — S51012A Laceration without foreign body of left elbow, initial encounter: Secondary | ICD-10-CM | POA: Insufficient documentation

## 2021-10-29 DIAGNOSIS — Z7901 Long term (current) use of anticoagulants: Secondary | ICD-10-CM | POA: Insufficient documentation

## 2021-10-29 DIAGNOSIS — I2510 Atherosclerotic heart disease of native coronary artery without angina pectoris: Secondary | ICD-10-CM | POA: Diagnosis not present

## 2021-10-29 DIAGNOSIS — I129 Hypertensive chronic kidney disease with stage 1 through stage 4 chronic kidney disease, or unspecified chronic kidney disease: Secondary | ICD-10-CM | POA: Insufficient documentation

## 2021-10-29 DIAGNOSIS — Z79899 Other long term (current) drug therapy: Secondary | ICD-10-CM | POA: Diagnosis not present

## 2021-10-29 DIAGNOSIS — L03114 Cellulitis of left upper limb: Secondary | ICD-10-CM | POA: Diagnosis not present

## 2021-10-29 DIAGNOSIS — S2242XA Multiple fractures of ribs, left side, initial encounter for closed fracture: Secondary | ICD-10-CM | POA: Insufficient documentation

## 2021-10-29 DIAGNOSIS — R0789 Other chest pain: Secondary | ICD-10-CM | POA: Insufficient documentation

## 2021-10-29 DIAGNOSIS — Z7984 Long term (current) use of oral hypoglycemic drugs: Secondary | ICD-10-CM | POA: Diagnosis not present

## 2021-10-29 DIAGNOSIS — E1122 Type 2 diabetes mellitus with diabetic chronic kidney disease: Secondary | ICD-10-CM | POA: Diagnosis not present

## 2021-10-29 DIAGNOSIS — Z794 Long term (current) use of insulin: Secondary | ICD-10-CM | POA: Diagnosis not present

## 2021-10-29 DIAGNOSIS — N184 Chronic kidney disease, stage 4 (severe): Secondary | ICD-10-CM | POA: Diagnosis not present

## 2021-10-29 DIAGNOSIS — Z85828 Personal history of other malignant neoplasm of skin: Secondary | ICD-10-CM | POA: Diagnosis not present

## 2021-10-29 DIAGNOSIS — S59902A Unspecified injury of left elbow, initial encounter: Secondary | ICD-10-CM | POA: Diagnosis present

## 2021-10-29 LAB — BASIC METABOLIC PANEL
Anion gap: 6 (ref 5–15)
BUN: 53 mg/dL — ABNORMAL HIGH (ref 8–23)
CO2: 21 mmol/L — ABNORMAL LOW (ref 22–32)
Calcium: 7.8 mg/dL — ABNORMAL LOW (ref 8.9–10.3)
Chloride: 109 mmol/L (ref 98–111)
Creatinine, Ser: 2.89 mg/dL — ABNORMAL HIGH (ref 0.61–1.24)
GFR, Estimated: 22 mL/min — ABNORMAL LOW (ref >60)
Glucose, Bld: 134 mg/dL — ABNORMAL HIGH (ref 70–99)
Potassium: 5 mmol/L (ref 3.5–5.1)
Sodium: 136 mmol/L (ref 135–145)

## 2021-10-29 LAB — CBC WITH DIFFERENTIAL/PLATELET
Abs Immature Granulocytes: 0.11 10*3/uL — ABNORMAL HIGH (ref 0.00–0.07)
Basophils Absolute: 0 10*3/uL (ref 0.0–0.1)
Basophils Relative: 0 %
Eosinophils Absolute: 0.1 10*3/uL (ref 0.0–0.5)
Eosinophils Relative: 1 %
HCT: 31.4 % — ABNORMAL LOW (ref 39.0–52.0)
Hemoglobin: 9.7 g/dL — ABNORMAL LOW (ref 13.0–17.0)
Immature Granulocytes: 2 %
Lymphocytes Relative: 11 %
Lymphs Abs: 0.8 10*3/uL (ref 0.7–4.0)
MCH: 28.5 pg (ref 26.0–34.0)
MCHC: 30.9 g/dL (ref 30.0–36.0)
MCV: 92.4 fL (ref 80.0–100.0)
Monocytes Absolute: 0.5 10*3/uL (ref 0.1–1.0)
Monocytes Relative: 7 %
Neutro Abs: 5.7 10*3/uL (ref 1.7–7.7)
Neutrophils Relative %: 79 %
Platelets: 119 10*3/uL — ABNORMAL LOW (ref 150–400)
RBC: 3.4 MIL/uL — ABNORMAL LOW (ref 4.22–5.81)
RDW: 14.8 % (ref 11.5–15.5)
WBC: 7.1 10*3/uL (ref 4.0–10.5)
nRBC: 0 % (ref 0.0–0.2)

## 2021-10-29 LAB — URINALYSIS, ROUTINE W REFLEX MICROSCOPIC
Bacteria, UA: NONE SEEN
Bilirubin Urine: NEGATIVE
Glucose, UA: >=500 mg/dL — AB
Ketones, ur: NEGATIVE mg/dL
Leukocytes,Ua: NEGATIVE
Nitrite: NEGATIVE
Protein, ur: NEGATIVE mg/dL
Specific Gravity, Urine: 1.01 (ref 1.005–1.030)
pH: 5 (ref 5.0–8.0)

## 2021-10-29 LAB — CBG MONITORING, ED: Glucose-Capillary: 225 mg/dL — ABNORMAL HIGH (ref 70–99)

## 2021-10-29 MED ORDER — MORPHINE SULFATE (PF) 4 MG/ML IV SOLN
4.0000 mg | Freq: Once | INTRAVENOUS | Status: AC
  Administered 2021-10-29: 4 mg via INTRAVENOUS
  Filled 2021-10-29: qty 1

## 2021-10-29 MED ORDER — ONDANSETRON HCL 4 MG/2ML IJ SOLN
4.0000 mg | Freq: Once | INTRAMUSCULAR | Status: AC
  Administered 2021-10-29: 4 mg via INTRAVENOUS
  Filled 2021-10-29: qty 2

## 2021-10-29 MED ORDER — DOXYCYCLINE HYCLATE 100 MG PO TABS: 100.0000 mg | ORAL_TABLET | Freq: Two times a day (BID) | ORAL | 0 refills | Status: AC

## 2021-10-29 MED ORDER — SODIUM CHLORIDE 0.9 % IV BOLUS
500.0000 mL | Freq: Once | INTRAVENOUS | Status: AC
  Administered 2021-10-29: 500 mL via INTRAVENOUS

## 2021-10-29 MED ORDER — HYDROCODONE-ACETAMINOPHEN 5-325 MG PO TABS: 1.0000 | ORAL_TABLET | ORAL | 0 refills | Status: DC | PRN

## 2021-10-29 NOTE — ED Notes (Signed)
ED Provider at bedside. 

## 2021-10-29 NOTE — Progress Notes (Signed)
Subjective:  Patient ID: Daniel Myers, male    DOB: Mar 29, 1945, 76 y.o.   MRN: [id number]  Patient Care Team: Claretta Fraise, MD as PCP - General (Family Medicine) Satira Sark, MD as PCP - Cardiology (Cardiology) Lavonna Monarch, MD (Inactive) as Consulting Physician (Dermatology) Katha Cabal, LCSW as [facility] (Licensed Clinical Social Worker)   Chief Complaint:  left elbow swelling (Post surgery, cancer removal)   HPI: Daniel Myers is a 76 y.o. male presenting on 10/29/2021 for left elbow swelling (Post surgery, cancer removal)   Pt presents today with complaints of redness, swelling, and pain to his left elbow. He had a skin cancer removed recently. States the day after the surgery he hit his elbow pretty hard causing pain. States 2-3 days ago his elbow started swelling, became hot and red with tenderness. He denies fever, chills, weakness, or confusion. Has not contacted the surgery center about symptoms. Has not taken anything for the symptoms.      Relevant past medical, surgical, family, and social history reviewed and updated as indicated.  Allergies and medications reviewed and updated. Data reviewed: Chart in Epic.   Past Medical History:  Diagnosis Date   Acute respiratory failure with hypoxia ([place]) 02/12/2020   AKI (acute kidney injury) ([place]) 02/13/2020   Atrial fibrillation with RVR ([place]) 03/11/2020   Basal cell carcinoma 06/27/2013   nodular on left jawline - excision   Basal cell carcinoma 07/01/2014   nodular on left hawling - CX3+5FU+excision   Basal cell carcinoma 10/10/2014   nodular on right forearm, middle - tx p bx   Basal cell carcinoma 02/11/2015   left neck - CX3 + excision   Basal cell carcinoma 06/14/2016   left jawline - CX3+5FU   Basal cell carcinoma 02/22/2017   superficial and nodular on left neck - excision   Basal cell carcinoma 04/11/2018   superficial and nodular on right inferior forearm -  CX3+Cautery+5FU   Chronic kidney disease    History of renal transplant    Hyperlipidemia    Hypertension    NSTEMI (non-ST elevated myocardial infarction) ([place]) 02/13/2020   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   Left [place] (well diff)   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   Right Breast (in situ)   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   Left Forearm - anterior (mod diff)   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   Neck - anterior (mod diff)   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   in situ on left arm - CX3+5FU   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   hypertrophic on left ear - CX3+5FU   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   in situ on right temple - CX3+5FU   Squamous cell carcinoma of skin 11/08/2011   right upper forearm - tx p bx   Squamous cell carcinoma of skin 11/08/2011   left upper forearm - tx p bx   Squamous cell carcinoma of skin 11/08/2011   left hand - tx p bx   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/27/2013   in situ on left lower back - CX3+5FU   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/27/2013   in situ on left forehead - CX3+5FU   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/27/2013   in situ on right temple - watch per ST   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/27/2013   in  situ on right forearm - CX3+5FU   Squamous cell carcinoma of skin 07/01/2014   well differentiated on right sideburn - CX3+5FU   Squamous cell carcinoma of skin 07/01/2014   in situ on left shoulder - CX3+5FU   Squamous cell carcinoma of skin 07/01/2014   in situ on right forearm, proximal - CX3+5FU+Cautery   Squamous cell carcinoma of skin 07/01/2014   in situ on right forearm, distal - CX3+5FU   Squamous cell carcinoma of skin 09/30/2015   in situ on posterior left ear - CX3+5FU   Squamous cell carcinoma of skin 02/22/2017   in situ on right upper arm - CX3+5FU   Squamous cell carcinoma of skin 02/22/2017   in situ on left  upper arm - CX3+5FU   Squamous cell carcinoma of skin 04/11/2018   in situ on right temple - CX3+5FU   Squamous cell carcinoma of skin 04/11/2018   in situ on lateral right arm - MOHs   Squamous cell carcinoma of skin 04/11/2018   in situ on right upper arm - MOHs   Squamous cell carcinoma of skin 04/11/2018   in situ on left sideburn   Squamous cell carcinoma of skin 04/11/2018   in situ on right flank - tx p bx   Squamous cell carcinoma of skin 09/28/2018   in situ on right inner ear - tx p bx   Squamous cell carcinoma of skin 09/28/2018   in situ on left inner ear - tx p bx   Squamous cell carcinoma of skin 09/28/2018   in situ on right arm - tx p bx   Squamous cell carcinoma of skin 03/21/2019   in situ on right antihelix ([place] treated topically)   Squamous cell carcinoma of skin 03/21/2019   in situ on right neck - CX3+5FU   Squamous cell carcinoma of skin 03/21/2019   in situ on left outer eye, inf (MOHs done 05/23/2019)   Type 2 diabetes mellitus ([place])    Unspecified atrial fibrillation ([place]) 02/17/2020    Past Surgical History:  Procedure Laterality Date   CARDIOVERSION N/A 10/16/2020   Procedure: CARDIOVERSION;  Surgeon: Larey Dresser, MD;  Location: [facility];  Service: Cardiovascular;  Laterality: N/A;   KIDNEY TRANSPLANT Right    RIGHT/LEFT HEART CATH AND CORONARY ANGIOGRAPHY N/A 09/09/2020   Procedure: RIGHT/LEFT HEART CATH AND CORONARY ANGIOGRAPHY;  Surgeon: Larey Dresser, MD;  Location: [facility] LAB;  Service: Cardiovascular;  Laterality: N/A;    Social History   Socioeconomic History   Marital status: Widowed    Spouse name: Not on file   Number of children: 3   Years of education: Not on file   Highest education level: Not on file  Occupational History   Not on file  Tobacco Use   Smoking status: Some Days    Types: Pipe   Smokeless tobacco: Never  Vaping Use   Vaping Use: Never used  Substance and Sexual Activity   Alcohol use: Never    Drug use: Never   Sexual activity: Not Currently  Other Topics Concern   Not on file  Social History Narrative   Wife passed away in 2007-05-19.   2 daughters, 1 son. All live close by.    3 grandchildren.    Social Determinants of Health   Financial Resource Strain: Low Risk  (04/15/2021)   Overall Financial Resource Strain (CARDIA)    Difficulty of Paying Living Expenses: Not hard at all  Food Insecurity:  No Food Insecurity (04/15/2021)   Hunger Vital Sign    Worried About Running Out of Food in the Last Year: Never true    Ran Out of Food in the Last Year: Never true  Transportation Needs: No Transportation Needs (04/15/2021)   PRAPARE - Hydrologist (Medical): No    Lack of Transportation (Non-Medical): No  Physical Activity: Insufficiently Active (04/15/2021)   Exercise Vital Sign    Days of Exercise per Week: 7 days    Minutes of Exercise per Session: 20 min  Stress: No Stress Concern Present (04/15/2021)   [place]    Feeling of Stress : Not at all  Social Connections: Moderately Integrated (04/15/2021)   Social Connection and Isolation Panel [NHANES]    Frequency of Communication with Friends and Family: More than three times a week    Frequency of Social Gatherings with Friends and Family: More than three times a week    Attends Religious Services: 1 to 4 times per year    Active Member of [employer] or Organizations: No    Attends Archivist Meetings: 1 to 4 times per year    Marital Status: Widowed  Intimate Partner Violence: Not At Risk (04/15/2021)   Humiliation, Afraid, Rape, and Kick questionnaire    Fear of Current or Ex-Partner: No    Emotionally Abused: No    Physically Abused: No    Sexually Abused: No    Outpatient Encounter Medications as of 10/29/2021  Medication Sig   acetaminophen (TYLENOL) 500 MG tablet Take 500 mg by mouth every 8 (eight) hours as needed for  moderate pain.   albuterol (VENTOLIN HFA) 108 (90 Base) MCG/ACT inhaler Inhale 2 puffs into the lungs every 6 (six) hours as needed for wheezing or shortness of breath.   ALPRAZolam (XANAX) 0.25 MG tablet Take 1 tablet (0.25 mg total) by mouth at bedtime as needed for anxiety.   amiodarone (PACERONE) 200 MG tablet Take 0.5 tablets (100 mg total) by mouth daily.   apixaban (ELIQUIS) 5 MG TABS tablet Take 1 tablet (5 mg total) by mouth 2 (two) times daily.   atorvastatin (LIPITOR) 40 MG tablet Take 1 tablet (40 mg total) by mouth daily.   dapagliflozin propanediol (FARXIGA) 10 MG TABS tablet Take 1 tablet (10 mg total) by mouth daily before breakfast.   doxycycline (VIBRA-TABS) 100 MG tablet Take 1 tablet (100 mg total) by mouth 2 (two) times daily for 10 days. 1 po bid   ezetimibe (ZETIA) 10 MG tablet Take 1 tablet (10 mg total) by mouth daily.   fluticasone (FLONASE) 50 MCG/ACT nasal spray USE ONE SPRAY(S) IN EACH NOSTRIL ONCE DAILY   furosemide (LASIX) 20 MG tablet Take 1 tablet (20 mg total) by mouth every other day.   insulin NPH Human (NOVOLIN N) 100 UNIT/ML injection 10 units AC breakfast and 10 units AC supper   insulin regular (NOVOLIN R) 100 units/mL injection 16 units w bkfst and 13 units w dinner   lisinopril (ZESTRIL) 5 MG tablet Take 1 tablet (5 mg total) by mouth daily.   loratadine (CLARITIN) 10 MG tablet Take 10 mg by mouth daily as needed for allergies.   metoprolol succinate (TOPROL-XL) 100 MG 24 hr tablet Take 1 tablet (100 mg total) by mouth daily. Take with or immediately following a meal.   mycophenolate (CELLCEPT) 250 MG capsule Take 500 mg by mouth 2 (two) times daily.  nitroGLYCERIN (NITROSTAT) 0.4 MG SL tablet Place 1 tablet (0.4 mg total) under the tongue every 5 (five) minutes as needed for chest pain.   ondansetron (ZOFRAN) 4 MG tablet Take 1 tablet (4 mg total) by mouth every 8 (eight) hours as needed for nausea or vomiting.   predniSONE (DELTASONE) 5 MG tablet Take 5  mg by mouth daily with breakfast.   RELION INSULIN SYRINGE 31G X 15/64" 0.5 ML MISC USE AS DIRECTED FOR INSULIN   sodium bicarbonate 650 MG tablet Take 1,300 mg by mouth 2 (two) times daily.   sodium zirconium cyclosilicate (LOKELMA) 10 g PACK packet Take 10 g by mouth as directed. By the HF clinic   sulfamethoxazole-trimethoprim (BACTRIM) 400-80 MG tablet Take 1 tablet by mouth every Monday, Wednesday, and Friday.   tacrolimus (PROGRAF) 0.5 MG capsule Take 1 mg by mouth in the morning and at bedtime.   Vericiguat (VERQUVO) 2.5 MG TABS Take 2.5 mg by mouth daily.   spironolactone (ALDACTONE) 25 MG tablet Take 12.5 mg by mouth daily. (Patient not taking: Reported on 10/29/2021)   No facility-administered encounter medications on file as of 10/29/2021.    Allergies  Allergen Reactions   Tape Rash    Use paper tape.    Trazodone And Nefazodone Palpitations    tachycardia   Mirtazapine     imbalance   Elemental Sulfur Rash    Review of Systems  Constitutional:  Negative for activity change, appetite change, chills, diaphoresis, fatigue, fever and unexpected weight change.  Respiratory:  Negative for cough and shortness of breath.   Cardiovascular:  Negative for chest pain, palpitations and leg swelling.  Gastrointestinal:  Negative for abdominal pain, diarrhea, nausea and vomiting.  Genitourinary:  Negative for decreased urine volume and difficulty urinating.  Musculoskeletal:  Positive for arthralgias and joint swelling.  Skin:  Positive for color change and wound. Negative for pallor and rash.  Neurological:  Negative for dizziness, weakness, light-headedness and headaches.  Psychiatric/Behavioral:  Negative for confusion.   All other systems reviewed and are negative.       Objective:  BP 130/66   Pulse 70   Temp 98.8 F (37.1 C)   Ht '5\' 10"'$  (1.778 m)   Wt 212 lb (96.2 kg)   SpO2 97%   BMI 30.42 kg/m    Wt Readings from Last 3 Encounters:  10/29/21 212 lb (96.2 kg)   10/14/21 211 lb 9.6 oz (96 kg)  08/05/21 209 lb (94.8 kg)    Physical Exam Vitals and nursing note reviewed.  Constitutional:      General: He is not in acute distress.    Appearance: Normal appearance. He is not ill-appearing, toxic-appearing or diaphoretic.  HENT:     Head: Normocephalic and atraumatic.     Nose: Nose normal.     Mouth/Throat:     Mouth: Mucous membranes are moist.  Eyes:     Conjunctiva/sclera: Conjunctivae normal.     Pupils: Pupils are equal, round, and reactive to light.  Cardiovascular:     Rate and Rhythm: Normal rate. Rhythm irregular.     Heart sounds: Murmur heard.     Systolic murmur is present with a grade of 1/6.  Pulmonary:     Effort: Pulmonary effort is normal.     Breath sounds: Normal breath sounds.  Musculoskeletal:     Right lower leg: No edema.     Left lower leg: No edema.  Skin:    General: Skin is warm and  dry.     Capillary Refill: Capillary refill takes less than 2 seconds.     Findings: Erythema and wound present.       Neurological:     General: No focal deficit present.     Mental Status: He is alert and oriented to person, place, and time.     Gait: Gait abnormal (using cane).  Psychiatric:        Mood and Affect: Mood normal.        Behavior: Behavior normal.        Thought Content: Thought content normal.        Judgment: Judgment normal.     Results for orders placed or performed during the hospital encounter of 09/47/09  Basic metabolic panel  Result Value Ref Range   Sodium 139 135 - 145 mmol/L   Potassium 5.5 (H) 3.5 - 5.1 mmol/L   Chloride 110 98 - 111 mmol/L   CO2 22 22 - 32 mmol/L   Glucose, Bld 101 (H) 70 - 99 mg/dL   BUN 45 (H) 8 - 23 mg/dL   Creatinine, Ser 2.30 (H) 0.61 - 1.24 mg/dL   Calcium 8.7 (L) 8.9 - 10.3 mg/dL   GFR, Estimated 29 (L) >60 mL/min   Anion gap 7 5 - 15       Pertinent labs & imaging results that were available during my care of the patient were reviewed by me and considered  in my medical decision making.  Assessment & Plan:  Daniel Myers was seen today for left elbow swelling.  Diagnoses and all orders for this visit:  Cellulitis of left elbow Cellulitis of left elbow post recent skin cancer removal surgery. On bactrim every other day due to renal transplant. Will place on doxycycline as well for the next 10 days. No signs of systemic infection. Red flags discussed in detail with pt and family. Aware to return to office in 1 weeks for reevaluation.  -     doxycycline (VIBRA-TABS) 100 MG tablet; Take 1 tablet (100 mg total) by mouth 2 (two) times daily for 10 days. 1 po bid     Continue all other maintenance medications.  Follow up plan: Return in about 1 week (around 11/05/2021), or if symptoms worsen or fail to improve, for cellulitis.   Continue healthy lifestyle choices, including diet (rich in fruits, vegetables, and lean proteins, and low in salt and simple carbohydrates) and exercise (at least 30 minutes of moderate physical activity daily).  Educational handout given for cellulitis  The above assessment and management plan was discussed with the patient. The patient verbalized understanding of and has agreed to the management plan. Patient is aware to call the clinic if they develop any new symptoms or if symptoms persist or worsen. Patient is aware when to return to the clinic for a follow-up visit. Patient educated on when it is appropriate to go to the emergency department.   Monia Pouch, FNP-C [facility] [phone]

## 2021-10-29 NOTE — ED Notes (Signed)
Called lab to question why pt blood has not been drawn yet- report they were told pt was in xray- explained that pt was in xray over an hour ago and to please come draw blood that was ordered at 1917. Lab at bedside now

## 2021-10-29 NOTE — ED Notes (Signed)
pt ambulated well with walker, says he has one at home and uses it when he needs it.

## 2021-10-29 NOTE — ED Triage Notes (Signed)
Pt to er, pt states that his heart doctor put him on some medicine because he has had an elevated potassium, states that his legs gave out on him today, pt has lac/skin tear to L arm, contusion to L knee and c/o L rib pain. Pt denies hitting his head or loc. Pt awake and oriented oriented.

## 2021-10-29 NOTE — ED Provider Notes (Signed)
[facility] EMERGENCY DEPARTMENT Provider Note   CSN: [id number] Arrival date & time: 10/29/21  1749     History  Chief Complaint  Patient presents with   Elmon Else is a 76 y.o. male.  Pt is a 76 yo male with a pmhx significant for HLD, HTN, CKD, skin cancer, DM2, afib on Eliquis, and CAD.  Pt took [facility] which was prescribed by his cardiologist for hyperkalemia.  Pt had severe diarrhea.  Today, his legs gave out and he fell.  He did not have a loc.  He did not hit his head.  He has pain mostly on his left chest wall, but also in his left shoulder, left elbow, left hip, and both knees.  He did sustain a skin tear to his left elbow.        Home Medications Prior to Admission medications   Medication Sig Start Date End Date Taking? Authorizing Provider  HYDROcodone-acetaminophen (NORCO/VICODIN) 5-325 MG tablet Take 1 tablet by mouth every 4 (four) hours as needed. 10/29/21  Yes Isla Pence, MD  acetaminophen (TYLENOL) 500 MG tablet Take 500 mg by mouth every 8 (eight) hours as needed for moderate pain.    [provider]  albuterol (VENTOLIN HFA) 108 (90 Base) MCG/ACT inhaler Inhale 2 puffs into the lungs every 6 (six) hours as needed for wheezing or shortness of breath. 03/20/20   Claretta Fraise, MD  ALPRAZolam Duanne Moron) 0.25 MG tablet Take 1 tablet (0.25 mg total) by mouth at bedtime as needed for anxiety. 08/05/21   Claretta Fraise, MD  amiodarone (PACERONE) 200 MG tablet Take 0.5 tablets (100 mg total) by mouth daily. 10/14/21   Larey Dresser, MD  apixaban (ELIQUIS) 5 MG TABS tablet Take 1 tablet (5 mg total) by mouth 2 (two) times daily. 11/12/20   Larey Dresser, MD  atorvastatin (LIPITOR) 40 MG tablet Take 1 tablet (40 mg total) by mouth daily. 12/17/20   Larey Dresser, MD  dapagliflozin propanediol (FARXIGA) 10 MG TABS tablet Take 1 tablet (10 mg total) by mouth daily before breakfast. 02/25/21   Claretta Fraise, MD  doxycycline (VIBRA-TABS) 100 MG tablet  Take 1 tablet (100 mg total) by mouth 2 (two) times daily for 10 days. 1 po bid 10/29/21 11/08/21  Baruch Gouty, FNP  ezetimibe (ZETIA) 10 MG tablet Take 1 tablet (10 mg total) by mouth daily. 08/17/21   Satira Sark, MD  fluticasone Asencion Islam) 50 MCG/ACT nasal spray USE ONE SPRAY(S) IN EACH NOSTRIL ONCE DAILY 05/07/20   Claretta Fraise, MD  furosemide (LASIX) 20 MG tablet Take 1 tablet (20 mg total) by mouth every other day. 10/14/21   Larey Dresser, MD  insulin NPH Human (NOVOLIN N) 100 UNIT/ML injection 10 units AC breakfast and 10 units AC supper 08/05/21   Claretta Fraise, MD  insulin regular (NOVOLIN R) 100 units/mL injection 16 units w bkfst and 13 units w dinner 04/30/21   Claretta Fraise, MD  lisinopril (ZESTRIL) 5 MG tablet Take 1 tablet (5 mg total) by mouth daily. 05/04/21   Claretta Fraise, MD  loratadine (CLARITIN) 10 MG tablet Take 10 mg by mouth daily as needed for allergies. 10/24/06   [provider]  metoprolol succinate (TOPROL-XL) 100 MG 24 hr tablet Take 1 tablet (100 mg total) by mouth daily. Take with or immediately following a meal. 08/05/21   Claretta Fraise, MD  mycophenolate (CELLCEPT) 250 MG capsule Take 500 mg by mouth 2 (two)  times daily. 06/10/14   [provider]  nitroGLYCERIN (NITROSTAT) 0.4 MG SL tablet Place 1 tablet (0.4 mg total) under the tongue every 5 (five) minutes as needed for chest pain. 11/05/20   Larey Dresser, MD  ondansetron (ZOFRAN) 4 MG tablet Take 1 tablet (4 mg total) by mouth every 8 (eight) hours as needed for nausea or vomiting. 04/17/20   Claretta Fraise, MD  predniSONE (DELTASONE) 5 MG tablet Take 5 mg by mouth daily with breakfast.    [provider]  Kenneth X 15/64" 0.5 ML MISC USE AS DIRECTED FOR INSULIN 07/04/18   [provider]  sodium bicarbonate 650 MG tablet Take 1,300 mg by mouth 2 (two) times daily. 09/26/18   [provider]  sodium zirconium cyclosilicate (LOKELMA) 10 g PACK  packet Take 10 g by mouth as directed. By the HF clinic 10/27/21   Larey Dresser, MD  spironolactone (ALDACTONE) 25 MG tablet Take 12.5 mg by mouth daily. Patient not taking: Reported on 10/29/2021 10/14/21   [provider]  sulfamethoxazole-trimethoprim (BACTRIM) 400-80 MG tablet Take 1 tablet by mouth every Monday, Wednesday, and Friday.    [provider]  tacrolimus (PROGRAF) 0.5 MG capsule Take 1 mg by mouth in the morning and at bedtime. 07/08/15   [provider]  Vericiguat (VERQUVO) 2.5 MG TABS Take 2.5 mg by mouth daily.    [provider]      Allergies    Tape, Trazodone and nefazodone, Mirtazapine, Elemental sulfur, and Sulfa antibiotics    Review of Systems   Review of Systems  Musculoskeletal:        Left shoulder, left hip, both knees, left elbow pain; left chest wall pain  Skin:  Positive for wound.       Skin tear left elbow  All other systems reviewed and are negative.   Physical Exam Updated Vital Signs BP (!) 117/53   Pulse (!) 50   Temp 98.2 F (36.8 C) (Oral)   Resp 19   Ht '5\' 10"'$  (1.778 m)   Wt 96.2 kg   SpO2 98%   BMI 30.42 kg/m  Physical Exam Vitals and nursing note reviewed.  Constitutional:      Appearance: Normal appearance.  HENT:     Head: Normocephalic and atraumatic.     Right Ear: External ear normal.     Left Ear: External ear normal.     Nose: Nose normal.     Mouth/Throat:     Mouth: Mucous membranes are dry.  Eyes:     Extraocular Movements: Extraocular movements intact.     Conjunctiva/sclera: Conjunctivae normal.     Pupils: Pupils are equal, round, and reactive to light.  Cardiovascular:     Rate and Rhythm: Normal rate and regular rhythm.     Pulses: Normal pulses.     Heart sounds: Normal heart sounds.  Pulmonary:     Effort: Pulmonary effort is normal.     Breath sounds: Normal breath sounds.  Chest:     Comments: Left chest wall pain Abdominal:     General: Abdomen is flat. Bowel  sounds are normal.     Palpations: Abdomen is soft.  Musculoskeletal:     Cervical back: Normal range of motion and neck supple.     Comments: Tenderness to left shoulder, left elbow, both knees.  Good rom.  Skin:    General: Skin is warm.     Capillary Refill: Capillary refill  takes less than 2 seconds.     Comments: Skin tear left elbow  Neurological:     General: No focal deficit present.     Mental Status: He is alert and oriented to person, place, and time.  Psychiatric:        Mood and Affect: Mood normal.        Behavior: Behavior normal.     ED Results / Procedures / Treatments   Labs (all labs ordered are listed, but only abnormal results are displayed) Labs Reviewed  BASIC METABOLIC PANEL - Abnormal; Notable for the following components:      Result Value   CO2 21 (*)    Glucose, Bld 134 (*)    BUN 53 (*)    Creatinine, Ser 2.89 (*)    Calcium 7.8 (*)    GFR, Estimated 22 (*)    All other components within normal limits  CBC WITH DIFFERENTIAL/PLATELET - Abnormal; Notable for the following components:   RBC 3.40 (*)    Hemoglobin 9.7 (*)    HCT 31.4 (*)    Platelets 119 (*)    Abs Immature Granulocytes 0.11 (*)    All other components within normal limits  URINALYSIS, ROUTINE W REFLEX MICROSCOPIC - Abnormal; Notable for the following components:   Glucose, UA >=500 (*)    Hgb urine dipstick SMALL (*)    All other components within normal limits  CBG MONITORING, ED - Abnormal; Notable for the following components:   Glucose-Capillary 225 (*)    All other components within normal limits    EKG EKG Interpretation  Date/Time:  Thursday October 29 2021 21:02:39 EDT Ventricular Rate:  57 PR Interval:  157 QRS Duration: 129 QT Interval:  466 QTC Calculation: 454 R Axis:   23 Text Interpretation: Sinus rhythm Nonspecific intraventricular conduction delay Inferior infarct, old Anterolateral infarct, age indeterminate No significant change since last tracing  Confirmed by Isla Pence [phone]) on 10/29/2021 9:34:15 PM  Radiology DG Hip Unilat W or Wo Pelvis 2-3 Views Left  Result Date: 10/29/2021 CLINICAL DATA:  [place], pain EXAM: DG HIP (WITH OR WITHOUT PELVIS) 2-3V LEFT COMPARISON:  03/11/2020 FINDINGS: Frontal view of the pelvis as well as frontal and frogleg lateral views of the left hip are obtained. Stable bilateral hip osteoarthritis. No fracture, subluxation, or dislocation. The remainder of the bony pelvis is unremarkable. Sacroiliac joints are unremarkable. IMPRESSION: 1. No acute displaced fracture. 2. Stable bilateral hip osteoarthritis. Electronically Signed   By: Randa Ngo M.D.   On: 10/29/2021 20:08   DG Elbow Complete Left  Result Date: 10/29/2021 CLINICAL DATA:  [place], pain, contusion EXAM: LEFT ELBOW - COMPLETE 3+ VIEW COMPARISON:  None Available. FINDINGS: Frontal, bilateral oblique, lateral views of the left elbow are obtained. Evaluation is limited by suboptimal patient positioning. No acute displaced fracture, subluxation, or dislocation. Joint spaces are relatively well preserved. No evidence of joint effusion. Soft tissue swelling overlying the olecranon. IMPRESSION: 1. Dorsal soft tissue swelling over the olecranon. 2. No acute fracture. Electronically Signed   By: Randa Ngo M.D.   On: 10/29/2021 20:07   DG Knee Complete 4 Views Right  Result Date: 10/29/2021 CLINICAL DATA:  [place], pain EXAM: RIGHT KNEE - COMPLETE 4+ VIEW; LEFT KNEE - COMPLETE 4+ VIEW COMPARISON:  None Available. FINDINGS: Right knee: Frontal, oblique, and lateral views of the right knee are obtained. No fracture, subluxation, or dislocation. Joint spaces are relatively well preserved. No joint effusion. Soft tissues are unremarkable. Left  knee: Frontal, oblique, lateral views are obtained. No fracture, subluxation, or dislocation. Joint spaces are well preserved. Mild patellar spurring, with prominent these a pathic changes along the lower pole. Prepatellar  soft tissue swelling. No joint effusion. IMPRESSION: 1. Unremarkable right knee. 2. Left prepatellar soft tissue swelling. Mild left patellofemoral compartmental osteoarthritis. No acute fracture. Electronically Signed   By: Randa Ngo M.D.   On: 10/29/2021 20:06   DG Knee Complete 4 Views Left  Result Date: 10/29/2021 CLINICAL DATA:  [place], pain EXAM: RIGHT KNEE - COMPLETE 4+ VIEW; LEFT KNEE - COMPLETE 4+ VIEW COMPARISON:  None Available. FINDINGS: Right knee: Frontal, oblique, and lateral views of the right knee are obtained. No fracture, subluxation, or dislocation. Joint spaces are relatively well preserved. No joint effusion. Soft tissues are unremarkable. Left knee: Frontal, oblique, lateral views are obtained. No fracture, subluxation, or dislocation. Joint spaces are well preserved. Mild patellar spurring, with prominent these a pathic changes along the lower pole. Prepatellar soft tissue swelling. No joint effusion. IMPRESSION: 1. Unremarkable right knee. 2. Left prepatellar soft tissue swelling. Mild left patellofemoral compartmental osteoarthritis. No acute fracture. Electronically Signed   By: Randa Ngo M.D.   On: 10/29/2021 20:06   DG Shoulder Left  Result Date: 10/29/2021 CLINICAL DATA:  [place], left arm pain EXAM: LEFT SHOULDER - 2+ VIEW COMPARISON:  03/11/2020 FINDINGS: Frontal and transscapular views of the left shoulder are obtained. No fracture, subluxation, or dislocation. Stable glenohumeral and acromioclavicular joint osteoarthritis. Soft tissues are unremarkable. Minimally displaced posterior left sixth and seventh rib fractures are better visualized on corresponding chest x-ray. IMPRESSION: 1. Left shoulder osteoarthritis. 2. Minimally displaced left posterior sixth and seventh rib fractures. Electronically Signed   By: Randa Ngo M.D.   On: 10/29/2021 20:04   DG Chest 2 View  Result Date: 10/29/2021 CLINICAL DATA:  [place] EXAM: CHEST - 2 VIEW COMPARISON:  05/22/2020  FINDINGS: Frontal and lateral views of the chest demonstrate a stable enlarged cardiac silhouette. No acute airspace disease, effusion, or pneumothorax. Acute displaced left posterior sixth and seventh rib fracture. No other acute bony abnormalities. IMPRESSION: 1. Displaced left posterior sixth and seventh rib fractures. 2. Otherwise no acute intrathoracic process. Electronically Signed   By: Randa Ngo M.D.   On: 10/29/2021 20:03    Procedures Procedures    Medications Ordered in ED Medications  sodium chloride 0.9 % bolus 500 mL (0 mLs Intravenous Stopped 10/29/21 2303)  morphine (PF) 4 MG/ML injection 4 mg (4 mg Intravenous Given 10/29/21 2225)  ondansetron (ZOFRAN) injection 4 mg (4 mg Intravenous Given 10/29/21 2225)    ED Course/ Medical Decision Making/ A&P                           Medical Decision Making Amount and/or Complexity of Data Reviewed Labs: ordered. Radiology: ordered. ECG/medicine tests: ordered.  Risk Prescription drug management.   This patient presents to the ED for concern of fall, this involves an extensive number of treatment options, and is a complaint that carries with it a high risk of complications and morbidity.  The differential diagnosis includes multiple trauma   Co morbidities that complicate the patient evaluation  HLD, HTN, CKD, skin cancer, DM2, afib on Eliquis, and CAD   Additional history obtained:  Additional history obtained from epic chart review External records from outside source obtained and reviewed including family   Lab Tests:  I Ordered, and personally interpreted labs.  The  pertinent results include:  cbc with hgb 9.7; bmp with bun 53 and cr 2.89 bun 45 and cr 2.3 2 days ago); ua nl   Imaging Studies ordered:  I ordered imaging studies including cxr, left hip, left elbow, left knee, right knee  I independently visualized and interpreted imaging which showed  CXR: IMPRESSION:  1. Displaced left posterior sixth  and seventh rib fractures.  2. Otherwise no acute intrathoracic process.  L shoulder: IMPRESSION:  1. Left shoulder osteoarthritis.  2. Minimally displaced left posterior sixth and seventh rib  fractures.  L and R knee: IMPRESSION:  1. Unremarkable right knee.  2. Left prepatellar soft tissue swelling. Mild left patellofemoral  compartmental osteoarthritis. No acute fracture.  L elbow: IMPRESSION:  1. Dorsal soft tissue swelling over the olecranon.  2. No acute fracture.  L hip: IMPRESSION:  1. No acute displaced fracture.  2. Stable bilateral hip osteoarthritis.   I agree with the radiologist interpretation   Cardiac Monitoring:  The patient was maintained on a cardiac monitor.  I personally viewed and interpreted the cardiac monitored which showed an underlying rhythm of: nsr   Medicines ordered and prescription drug management:  I ordered medication including ivfs  for dehydration and morphine for pain  Reevaluation of the patient after these medicines showed that the patient improved I have reviewed the patients home medicines and have made adjustments as needed   Test Considered:  ct   Critical Interventions:  Pain control   Problem List / ED Course:  Rib fx to 6 and 7.  Pt's pain is much better.  He is able to ambulate.  O2 sats are excellent.  Pt given an incentive spirometer and is d/c with lortab. Worsening renal function.  Kidney function is gradually getting worse.  Pt is on lasix which he needs for CHF.  Pt given gentle hydration today.  I think he fell b/c of the diarrhea caused by the [facility] which made him dehydrated.  Pt is to f/u with his nephrologist.  Return if worse.    Reevaluation:  After the interventions noted above, I reevaluated the patient and found that they have :improved   Social Determinants of Health:  Lives at home   Dispostion:  After consideration of the diagnostic results and the patients response to treatment, I feel  that the patent would benefit from discharge with outpatient f/u.          Final Clinical Impression(s) / ED Diagnoses Final diagnoses:  Fall, initial encounter  Skin tear of left elbow without complication, initial encounter  Closed fracture of multiple ribs of left side, initial encounter  Chronic kidney disease (CKD), stage IV (severe) ([place])    Rx / DC Orders ED Discharge Orders          Ordered    HYDROcodone-acetaminophen (NORCO/VICODIN) 5-325 MG tablet  Every 4 hours PRN        10/29/21 2313              Isla Pence, MD 10/29/21 2319

## 2021-10-29 NOTE — ED Notes (Signed)
Pt taken to xray/CT- will return to room #3

## 2021-10-29 NOTE — ED Notes (Signed)
Pt does not have pacemaker- ok to get orthostatic vitals after xrays result

## 2021-10-29 NOTE — ED Notes (Signed)
Pt family moved to room #3 a/w pt return from xray

## 2021-11-01 LAB — ECHOCARDIOGRAM COMPLETE
Area-P 1/2: 1.39 cm2
P 1/2 time: 683 msec
S' Lateral: 4.5 cm

## 2021-11-02 ENCOUNTER — Other Ambulatory Visit (HOSPITAL_COMMUNITY): Payer: Medicare Other

## 2021-11-03 ENCOUNTER — Ambulatory Visit: Payer: Medicare Other | Admitting: Family Medicine

## 2021-11-05 ENCOUNTER — Ambulatory Visit: Payer: Medicare Other | Admitting: Family Medicine

## 2021-11-05 ENCOUNTER — Encounter: Payer: Self-pay | Admitting: Family Medicine

## 2021-11-05 ENCOUNTER — Ambulatory Visit (INDEPENDENT_AMBULATORY_CARE_PROVIDER_SITE_OTHER): Payer: Medicare Other | Admitting: Family Medicine

## 2021-11-05 VITALS — BP 144/58 | HR 58 | Temp 97.6°F | Ht 70.0 in | Wt 212.0 lb

## 2021-11-05 DIAGNOSIS — S41112D Laceration without foreign body of left upper arm, subsequent encounter: Secondary | ICD-10-CM

## 2021-11-05 DIAGNOSIS — I2510 Atherosclerotic heart disease of native coronary artery without angina pectoris: Secondary | ICD-10-CM

## 2021-11-05 NOTE — Progress Notes (Signed)
BP (!) 144/58   Pulse (!) 58   Temp 97.6 F (36.4 C)   Ht '5\' 10"'$  (1.778 m)   Wt 212 lb (96.2 kg)   SpO2 100%   BMI 30.42 kg/m    Subjective:   Patient ID: Daniel Myers, male    DOB: 02-25-1945, 76 y.o.   MRN: [id number]  HPI: Daniel Myers is a 76 y.o. male presenting on 11/05/2021 for Cellulitis (Left elbow)   HPI Left elbow fall and skin tear Patient was initially in here for a fall and cellulitis on 10/29/2021 and was started on antibiotic for the cellulitis of the left elbow, this was after he had a skin cancer lesion removed from that same area.  He says initially then he fell because he tripped over his shoes.  He then on [id number] fell again onto this arm causing a skin tear that was bigger than the initial spot.  He went into the emergency department that day and was treated for the skin tear and put Tegaderm on it which he left on for couple of days.  He is still taking the oral antibiotic.  Patient denies any fevers or chills or redness or warmth.  Relevant past medical, surgical, family and social history reviewed and updated as indicated. Interim medical history since our last visit reviewed. Allergies and medications reviewed and updated.  Review of Systems  Constitutional:  Negative for activity change, chills and fever.  Skin:  Positive for wound. Negative for color change and rash.    Per HPI unless specifically indicated above   Allergies as of 11/05/2021       Reactions   Tape Rash   Use paper tape.    Trazodone And Nefazodone Palpitations   tachycardia   Mirtazapine    imbalance   Elemental Sulfur Rash   Sulfa Antibiotics Rash        Medication List        Accurate as of November 05, 2021 11:15 AM. If you have any questions, ask your nurse or doctor.          acetaminophen 500 MG tablet Commonly known as: TYLENOL Take 500 mg by mouth every 8 (eight) hours as needed for moderate pain.   albuterol 108 (90 Base) MCG/ACT inhaler Commonly  known as: VENTOLIN HFA Inhale 2 puffs into the lungs every 6 (six) hours as needed for wheezing or shortness of breath.   ALPRAZolam 0.25 MG tablet Commonly known as: XANAX Take 1 tablet (0.25 mg total) by mouth at bedtime as needed for anxiety.   amiodarone 200 MG tablet Commonly known as: PACERONE Take 0.5 tablets (100 mg total) by mouth daily.   apixaban 5 MG Tabs tablet Commonly known as: Eliquis Take 1 tablet (5 mg total) by mouth 2 (two) times daily.   atorvastatin 40 MG tablet Commonly known as: LIPITOR Take 1 tablet (40 mg total) by mouth daily.   dapagliflozin propanediol 10 MG Tabs tablet Commonly known as: Farxiga Take 1 tablet (10 mg total) by mouth daily before breakfast.   doxycycline 100 MG tablet Commonly known as: VIBRA-TABS Take 1 tablet (100 mg total) by mouth 2 (two) times daily for 10 days. 1 po bid   ezetimibe 10 MG tablet Commonly known as: Zetia Take 1 tablet (10 mg total) by mouth daily.   fluticasone 50 MCG/ACT nasal spray Commonly known as: FLONASE USE ONE SPRAY(S) IN EACH NOSTRIL ONCE DAILY   furosemide 20 MG tablet Commonly known  as: LASIX Take 1 tablet (20 mg total) by mouth every other day.   HYDROcodone-acetaminophen 5-325 MG tablet Commonly known as: NORCO/VICODIN Take 1 tablet by mouth every 4 (four) hours as needed.   insulin NPH Human 100 UNIT/ML injection Commonly known as: NOVOLIN N 10 units AC breakfast and 10 units AC supper   insulin regular 100 units/mL injection Commonly known as: NOVOLIN R 16 units w bkfst and 13 units w dinner   lisinopril 5 MG tablet Commonly known as: ZESTRIL Take 1 tablet (5 mg total) by mouth daily.   Lokelma 10 g Pack packet Generic drug: sodium zirconium cyclosilicate Take 10 g by mouth as directed. By the HF clinic   loratadine 10 MG tablet Commonly known as: CLARITIN Take 10 mg by mouth daily as needed for allergies.   metoprolol succinate 100 MG 24 hr tablet Commonly known as:  TOPROL-XL Take 1 tablet (100 mg total) by mouth daily. Take with or immediately following a meal.   mycophenolate 250 MG capsule Commonly known as: CELLCEPT Take 500 mg by mouth 2 (two) times daily.   nitroGLYCERIN 0.4 MG SL tablet Commonly known as: NITROSTAT Place 1 tablet (0.4 mg total) under the tongue every 5 (five) minutes as needed for chest pain.   ondansetron 4 MG tablet Commonly known as: ZOFRAN Take 1 tablet (4 mg total) by mouth every 8 (eight) hours as needed for nausea or vomiting.   predniSONE 5 MG tablet Commonly known as: DELTASONE Take 5 mg by mouth daily with breakfast.   ReliOn Insulin Syringe 31G X 15/64" 0.5 ML Misc Generic drug: Insulin Syringe-Needle U-100 USE AS DIRECTED FOR INSULIN   sodium bicarbonate 650 MG tablet Take 1,300 mg by mouth 2 (two) times daily.   spironolactone 25 MG tablet Commonly known as: ALDACTONE Take 12.5 mg by mouth daily.   sulfamethoxazole-trimethoprim 400-80 MG tablet Commonly known as: BACTRIM Take 1 tablet by mouth every Monday, Wednesday, and Friday.   tacrolimus 0.5 MG capsule Commonly known as: PROGRAF Take 1 mg by mouth in the morning and at bedtime.   Verquvo 2.5 MG Tabs Generic drug: Vericiguat Take 2.5 mg by mouth daily.         Objective:   BP (!) 144/58   Pulse (!) 58   Temp 97.6 F (36.4 C)   Ht '5\' 10"'$  (1.778 m)   Wt 212 lb (96.2 kg)   SpO2 100%   BMI 30.42 kg/m   Wt Readings from Last 3 Encounters:  11/05/21 212 lb (96.2 kg)  10/29/21 212 lb (96.2 kg)  10/29/21 212 lb (96.2 kg)    Physical Exam Vitals and nursing note reviewed.  Constitutional:      Appearance: Normal appearance.  Skin:      Neurological:     Mental Status: He is alert.    Daily dressing changes using triple antibiotic ointment and nonstick gauze and tape or Coban to hold it in place.  Take bandage off right before shower, wash with simple soap and water and then leave to air dry for at least an hour and then  reapply bandage with triple antibiotic and nonstick gauze   Assessment & Plan:   Problem List Items Addressed This Visit   None Visit Diagnoses     Skin tear of left upper arm without complication, subsequent encounter    -  Primary       Has an appointment with PCP next week, recommended as above wound care changes. Follow up  plan: Return if symptoms worsen or fail to improve.  Counseling provided for all of the vaccine components No orders of the defined types were placed in this encounter.   Caryl Pina, MD [facility] 11/05/2021, 11:15 AM

## 2021-11-07 ENCOUNTER — Other Ambulatory Visit (HOSPITAL_COMMUNITY): Payer: Self-pay | Admitting: Cardiology

## 2021-11-10 ENCOUNTER — Ambulatory Visit (INDEPENDENT_AMBULATORY_CARE_PROVIDER_SITE_OTHER): Payer: Medicare Other | Admitting: Family Medicine

## 2021-11-10 ENCOUNTER — Encounter: Payer: Self-pay | Admitting: Family Medicine

## 2021-11-10 VITALS — BP 119/44 | HR 56 | Temp 97.3°F | Ht 70.0 in | Wt 211.2 lb

## 2021-11-10 DIAGNOSIS — I2510 Atherosclerotic heart disease of native coronary artery without angina pectoris: Secondary | ICD-10-CM | POA: Diagnosis not present

## 2021-11-10 DIAGNOSIS — E119 Type 2 diabetes mellitus without complications: Secondary | ICD-10-CM | POA: Diagnosis not present

## 2021-11-10 DIAGNOSIS — Z794 Long term (current) use of insulin: Secondary | ICD-10-CM

## 2021-11-10 DIAGNOSIS — I4819 Other persistent atrial fibrillation: Secondary | ICD-10-CM | POA: Diagnosis not present

## 2021-11-10 DIAGNOSIS — E782 Mixed hyperlipidemia: Secondary | ICD-10-CM | POA: Diagnosis not present

## 2021-11-10 DIAGNOSIS — I10 Essential (primary) hypertension: Secondary | ICD-10-CM

## 2021-11-10 DIAGNOSIS — Z940 Kidney transplant status: Secondary | ICD-10-CM

## 2021-11-10 DIAGNOSIS — D849 Immunodeficiency, unspecified: Secondary | ICD-10-CM

## 2021-11-10 LAB — BAYER DCA HB A1C WAIVED: HB A1C (BAYER DCA - WAIVED): 6.2 % — ABNORMAL HIGH (ref 4.8–5.6)

## 2021-11-10 MED ORDER — ATORVASTATIN CALCIUM 40 MG PO TABS: 40.0000 mg | ORAL_TABLET | Freq: Every day | ORAL | 3 refills | Status: AC

## 2021-11-10 NOTE — Progress Notes (Signed)
Subjective:  Patient ID: Daniel Myers,  male    DOB: September 24, 1945  Age: 76 y.o.    CC: Medical Management of Chronic Issues   HPI [name] presents for  follow-up of hypertension. Patient has no history of headache chest pain or shortness of breath or recent cough. Patient also denies symptoms of TIA such as numbness weakness lateralizing. Patient denies side effects from medication. States taking it regularly.  Patient also  in for follow-up of elevated cholesterol. Doing well without complaints on current medication. Denies side effects  including myalgia and arthralgia and nausea. Also in today for liver function testing. Currently no chest pain, shortness of breath or other cardiovascular related symptoms noted.  Follow-up of diabetes. Patient does check blood sugar at home. Readings run between 100 and 150 Patient denies symptoms such as excessive hunger or urinary frequency, excessive hunger, nausea No significant hypoglycemic spells noted. Medications reviewed. Pt reports taking them regularly. Pt. denies complication/adverse reaction today.    History [name] has a past medical history of Acute respiratory failure with hypoxia ([place]) (02/12/2020), AKI (acute kidney injury) ([place]) (02/13/2020), Atrial fibrillation with RVR ([place]) (03/11/2020), Basal cell carcinoma (06/27/2013), Basal cell carcinoma (07/01/2014), Basal cell carcinoma (10/10/2014), Basal cell carcinoma (02/11/2015), Basal cell carcinoma (06/14/2016), Basal cell carcinoma (02/22/2017), Basal cell carcinoma (04/11/2018), Chronic kidney disease, History of renal transplant, Hyperlipidemia, Hypertension, NSTEMI (non-ST elevated myocardial infarction) ([place]) (02/13/2020), 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), 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), 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), 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), 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),  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), 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), Squamous cell carcinoma of skin (11/08/2011), Squamous cell carcinoma of skin (11/08/2011), Squamous cell carcinoma of skin (11/08/2011), 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), 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), 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), 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), 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), 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), 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), 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), Squamous cell carcinoma of skin (09/30/2015), Squamous cell carcinoma of skin (02/22/2017), Squamous cell carcinoma of skin (02/22/2017), Squamous cell carcinoma of skin (04/11/2018), Squamous cell carcinoma of skin (04/11/2018), Squamous cell carcinoma of skin (04/11/2018), Squamous cell carcinoma of skin (04/11/2018), Squamous cell carcinoma of skin (04/11/2018), Squamous cell carcinoma of skin (09/28/2018), Squamous cell carcinoma of skin (09/28/2018), Squamous cell carcinoma of skin (09/28/2018), Squamous cell carcinoma of skin (03/21/2019), Squamous cell carcinoma of skin (03/21/2019), Squamous cell carcinoma of skin (03/21/2019), Type 2 diabetes mellitus ([place]), and Unspecified atrial fibrillation ([place]) (02/17/2020).   He has a past surgical history that includes Kidney transplant (Right); RIGHT/LEFT HEART CATH AND CORONARY ANGIOGRAPHY (N/A, 09/09/2020); and Cardioversion (N/A, 10/16/2020).   His family history includes Clotting disorder in his mother; Diabetes in his sister; Hypertension in his sister.He reports that he has been smoking pipe. He has never used smokeless tobacco. He reports that he does not drink alcohol and does not use drugs.  Current Outpatient Medications on File Prior to Visit  Medication Sig Dispense Refill   acetaminophen (TYLENOL) 500 MG tablet Take 500 mg by mouth every 8 (eight) hours as needed  for moderate pain.     albuterol (VENTOLIN HFA) 108 (90 Base) MCG/ACT inhaler Inhale 2 puffs into the lungs every 6 (six) hours as needed for wheezing or shortness of breath. 1 each 3   ALPRAZolam (XANAX) 0.25 MG tablet Take 1  tablet (0.25 mg total) by mouth at bedtime as needed for anxiety. 90 tablet 1   amiodarone (PACERONE) 200 MG tablet Take 0.5 tablets (100 mg total) by mouth daily. 45 tablet 3   dapagliflozin propanediol (FARXIGA) 10 MG TABS tablet Take 1 tablet (10 mg total) by mouth daily before breakfast. 90 tablet 3   ELIQUIS 5 MG TABS tablet Take 1 tablet by mouth twice daily 60 tablet 11   ezetimibe (ZETIA) 10 MG tablet Take 1 tablet (10 mg total) by mouth daily. 90 tablet 3   fluticasone (FLONASE) 50 MCG/ACT nasal spray USE ONE SPRAY(S) IN EACH NOSTRIL ONCE DAILY 16 g 11   furosemide (LASIX) 20 MG tablet Take 1 tablet (20 mg total) by mouth every other day. 90 tablet 3   HYDROcodone-acetaminophen (NORCO/VICODIN) 5-325 MG tablet Take 1 tablet by mouth every 4 (four) hours as needed. 10 tablet 0   insulin NPH Human (NOVOLIN N) 100 UNIT/ML injection 10 units AC breakfast and 10 units AC supper 30 mL 11   insulin regular (NOVOLIN R) 100 units/mL injection 16 units w bkfst and 13 units w dinner 10 mL 5   lisinopril (ZESTRIL) 5 MG tablet Take 1 tablet (5 mg total) by mouth daily. 90 tablet 1   loratadine (CLARITIN) 10 MG tablet Take 10 mg by mouth daily as needed for allergies.     metoprolol succinate (TOPROL-XL) 100 MG 24 hr tablet Take 1 tablet (100 mg total) by mouth daily. Take with or immediately following a meal. 90 tablet 3   mycophenolate (CELLCEPT) 250 MG capsule Take 500 mg by mouth 2 (two) times daily.     nitroGLYCERIN (NITROSTAT) 0.4 MG SL tablet Place 1 tablet (0.4 mg total) under the tongue every 5 (five) minutes as needed for chest pain. 25 tablet 3   ondansetron (ZOFRAN) 4 MG tablet Take 1 tablet (4 mg total) by mouth every 8 (eight) hours as needed for nausea or vomiting.  20 tablet 0   predniSONE (DELTASONE) 5 MG tablet Take 5 mg by mouth daily with breakfast.     RELION INSULIN SYRINGE 31G X 15/64" 0.5 ML MISC USE AS DIRECTED FOR INSULIN     sodium bicarbonate 650 MG tablet Take 1,300 mg by mouth 2 (two) times daily.     sodium zirconium cyclosilicate (LOKELMA) 10 g PACK packet Take 10 g by mouth as directed. By the HF clinic 30 each 0   spironolactone (ALDACTONE) 25 MG tablet Take 12.5 mg by mouth daily.     sulfamethoxazole-trimethoprim (BACTRIM) 400-80 MG tablet Take 1 tablet by mouth every Monday, Wednesday, and Friday.     tacrolimus (PROGRAF) 0.5 MG capsule Take 1 mg by mouth in the morning and at bedtime.     Vericiguat (VERQUVO) 2.5 MG TABS Take 2.5 mg by mouth daily.     No current facility-administered medications on file prior to visit.    ROS Review of Systems  Constitutional:  Negative for fever.  Respiratory:  Negative for shortness of breath.   Cardiovascular:  Negative for chest pain.  Gastrointestinal:  Positive for abdominal pain (heartburn about once a week. Lasts a good while sometimes).  Musculoskeletal:  Negative for arthralgias.  Skin:  Negative for rash.    Objective:  BP (!) 119/44   Pulse (!) 56   Temp (!) 97.3 F (36.3 C)   Ht '5\' 10"'  (1.778 m)   Wt 211 lb 3.2 oz (95.8 kg)   SpO2 97%   BMI 30.30  kg/m   BP Readings from Last 3 Encounters:  11/10/21 (!) 119/44  11/05/21 (!) 144/58  10/29/21 (!) 115/52    Wt Readings from Last 3 Encounters:  11/10/21 211 lb 3.2 oz (95.8 kg)  11/05/21 212 lb (96.2 kg)  10/29/21 212 lb (96.2 kg)     Physical Exam Vitals reviewed.  Constitutional:      Appearance: He is well-developed.  HENT:     Head: Normocephalic and atraumatic.     Right Ear: External ear normal.     Left Ear: External ear normal.     Mouth/Throat:     Pharynx: No oropharyngeal exudate or posterior oropharyngeal erythema.  Eyes:     Pupils: Pupils are equal, round, and reactive to light.   Cardiovascular:     Rate and Rhythm: Normal rate and regular rhythm.     Heart sounds: No murmur heard. Pulmonary:     Effort: No respiratory distress.     Breath sounds: Normal breath sounds.  Musculoskeletal:     Cervical back: Normal range of motion and neck supple.     Right lower leg: No edema.     Left lower leg: No edema.  Neurological:     Mental Status: He is alert and oriented to person, place, and time.     Diabetic Foot Exam - Simple   No data filed     Lab Results  Component Value Date   HGBA1C 6.8 (H) 08/05/2021   HGBA1C 7.7 (H) 04/30/2021   HGBA1C 7.2 (H) 11/05/2020   A1c today = 6.2  Assessment & Plan:   Daniel Myers was seen today for medical management of chronic issues.  Diagnoses and all orders for this visit:  Insulin dependent type 2 diabetes mellitus ([place]) -     Bayer DCA Hb A1c Waived  Essential hypertension -     CBC with Differential/Platelet -     CMP14+EGFR  Mixed hyperlipidemia -     Lipid panel -     atorvastatin (LIPITOR) 40 MG tablet; Take 1 tablet (40 mg total) by mouth daily.  Persistent atrial fibrillation ([place])  Immunosuppression ([place])  Renal transplant recipient   I am having Jahmire L. Bianchini maintain his tacrolimus, sodium bicarbonate, mycophenolate, ReliOn Insulin Syringe, loratadine, albuterol, predniSONE, ondansetron, fluticasone, acetaminophen, nitroGLYCERIN, sulfamethoxazole-trimethoprim, Verquvo, dapagliflozin propanediol, insulin regular, lisinopril, ALPRAZolam, insulin NPH Human, metoprolol succinate, ezetimibe, amiodarone, furosemide, Lokelma, spironolactone, HYDROcodone-acetaminophen, Eliquis, and atorvastatin.  Meds ordered this encounter  Medications   atorvastatin (LIPITOR) 40 MG tablet    Sig: Take 1 tablet (40 mg total) by mouth daily.    Dispense:  90 tablet    Refill:  3    Please cancel all previous orders for current medication. Change in dosage or pill size.     Follow-up: Return in about 3 months  (around 02/10/2022).  Claretta Fraise, M.D.

## 2021-11-11 LAB — CMP14+EGFR
ALT: 12 IU/L (ref 0–44)
AST: 18 IU/L (ref 0–40)
Albumin/Globulin Ratio: 2 (ref 1.2–2.2)
Albumin: 3.8 g/dL (ref 3.8–4.8)
Alkaline Phosphatase: 107 IU/L (ref 44–121)
BUN/Creatinine Ratio: 17 (ref 10–24)
BUN: 36 mg/dL — ABNORMAL HIGH (ref 8–27)
Bilirubin Total: 0.7 mg/dL (ref 0.0–1.2)
CO2: 17 mmol/L — ABNORMAL LOW (ref 20–29)
Calcium: 8.6 mg/dL (ref 8.6–10.2)
Chloride: 100 mmol/L (ref 96–106)
Creatinine, Ser: 2.08 mg/dL — ABNORMAL HIGH (ref 0.76–1.27)
Globulin, Total: 1.9 g/dL (ref 1.5–4.5)
Glucose: 84 mg/dL (ref 70–99)
Potassium: 5.8 mmol/L — ABNORMAL HIGH (ref 3.5–5.2)
Sodium: 131 mmol/L — ABNORMAL LOW (ref 134–144)
Total Protein: 5.7 g/dL — ABNORMAL LOW (ref 6.0–8.5)
eGFR: 33 mL/min/{1.73_m2} — ABNORMAL LOW (ref >59)

## 2021-11-11 LAB — CBC WITH DIFFERENTIAL/PLATELET
Basophils Absolute: 0 10*3/uL (ref 0.0–0.2)
Basos: 0 %
EOS (ABSOLUTE): 0.1 10*3/uL (ref 0.0–0.4)
Eos: 1 %
Hematocrit: 29.6 % — ABNORMAL LOW (ref 37.5–51.0)
Hemoglobin: 9.8 g/dL — ABNORMAL LOW (ref 13.0–17.7)
Lymphocytes Absolute: 1.2 10*3/uL (ref 0.7–3.1)
Lymphs: 14 %
MCH: 29 pg (ref 26.6–33.0)
MCHC: 33.1 g/dL (ref 31.5–35.7)
MCV: 88 fL (ref 79–97)
Monocytes Absolute: 0.3 10*3/uL (ref 0.1–0.9)
Monocytes: 4 %
Neutrophils Absolute: 6.6 10*3/uL (ref 1.4–7.0)
Neutrophils: 78 %
Platelets: 216 10*3/uL (ref 150–450)
RBC: 3.38 x10E6/uL — ABNORMAL LOW (ref 4.14–5.80)
RDW: 13.5 % (ref 11.6–15.4)
WBC: 8.4 10*3/uL (ref 3.4–10.8)

## 2021-11-11 LAB — IMMATURE CELLS: Metamyelocytes: 3 % — ABNORMAL HIGH (ref 0–0)

## 2021-11-11 LAB — LIPID PANEL
Chol/HDL Ratio: 3 ratio (ref 0.0–5.0)
Cholesterol, Total: 78 mg/dL — ABNORMAL LOW (ref 100–199)
HDL: 26 mg/dL — ABNORMAL LOW (ref >39)
LDL Chol Calc (NIH): 34 mg/dL (ref 0–99)
Triglycerides: 87 mg/dL (ref 0–149)
VLDL Cholesterol Cal: 18 mg/dL (ref 5–40)

## 2021-11-13 ENCOUNTER — Encounter: Payer: Self-pay | Admitting: Family Medicine

## 2021-11-13 ENCOUNTER — Other Ambulatory Visit: Payer: Self-pay | Admitting: Family Medicine

## 2021-11-13 MED ORDER — ALBUTEROL SULFATE HFA 108 (90 BASE) MCG/ACT IN AERS: 2.0000 | INHALATION_SPRAY | Freq: Four times a day (QID) | RESPIRATORY_TRACT | 3 refills | Status: AC | PRN

## 2021-11-15 ENCOUNTER — Other Ambulatory Visit: Payer: Self-pay | Admitting: Family Medicine

## 2021-11-15 DIAGNOSIS — G4700 Insomnia, unspecified: Secondary | ICD-10-CM

## 2021-11-16 ENCOUNTER — Emergency Department (HOSPITAL_COMMUNITY): Payer: Medicare Other

## 2021-11-16 ENCOUNTER — Inpatient Hospital Stay (HOSPITAL_COMMUNITY)
Admission: EM | Admit: 2021-11-16 | Discharge: 2021-11-20 | DRG: 280 | Disposition: A | Discharge status: Home Health | Payer: Medicare Other | Attending: Family Medicine | Admitting: Family Medicine

## 2021-11-16 ENCOUNTER — Encounter (HOSPITAL_COMMUNITY): Payer: Self-pay | Admitting: *Deleted

## 2021-11-16 ENCOUNTER — Other Ambulatory Visit: Payer: Self-pay

## 2021-11-16 DIAGNOSIS — E669 Obesity, unspecified: Secondary | ICD-10-CM | POA: Diagnosis present

## 2021-11-16 DIAGNOSIS — E119 Type 2 diabetes mellitus without complications: Secondary | ICD-10-CM

## 2021-11-16 DIAGNOSIS — D631 Anemia in chronic kidney disease: Secondary | ICD-10-CM | POA: Diagnosis present

## 2021-11-16 DIAGNOSIS — Z1152 Encounter for screening for COVID-19: Secondary | ICD-10-CM | POA: Diagnosis not present

## 2021-11-16 DIAGNOSIS — E782 Mixed hyperlipidemia: Secondary | ICD-10-CM | POA: Diagnosis present

## 2021-11-16 DIAGNOSIS — F419 Anxiety disorder, unspecified: Secondary | ICD-10-CM | POA: Diagnosis present

## 2021-11-16 DIAGNOSIS — Z91048 Other nonmedicinal substance allergy status: Secondary | ICD-10-CM

## 2021-11-16 DIAGNOSIS — E46 Unspecified protein-calorie malnutrition: Secondary | ICD-10-CM | POA: Diagnosis present

## 2021-11-16 DIAGNOSIS — N390 Urinary tract infection, site not specified: Secondary | ICD-10-CM | POA: Diagnosis present

## 2021-11-16 DIAGNOSIS — I480 Paroxysmal atrial fibrillation: Secondary | ICD-10-CM | POA: Diagnosis not present

## 2021-11-16 DIAGNOSIS — N179 Acute kidney failure, unspecified: Secondary | ICD-10-CM | POA: Diagnosis present

## 2021-11-16 DIAGNOSIS — Z91198 Patient's noncompliance with other medical treatment and regimen for other reason: Secondary | ICD-10-CM

## 2021-11-16 DIAGNOSIS — Z85828 Personal history of other malignant neoplasm of skin: Secondary | ICD-10-CM

## 2021-11-16 DIAGNOSIS — I252 Old myocardial infarction: Secondary | ICD-10-CM | POA: Diagnosis not present

## 2021-11-16 DIAGNOSIS — Z7189 Other specified counseling: Secondary | ICD-10-CM | POA: Diagnosis not present

## 2021-11-16 DIAGNOSIS — E1122 Type 2 diabetes mellitus with diabetic chronic kidney disease: Secondary | ICD-10-CM | POA: Diagnosis present

## 2021-11-16 DIAGNOSIS — Z8616 Personal history of COVID-19: Secondary | ICD-10-CM | POA: Diagnosis not present

## 2021-11-16 DIAGNOSIS — Z940 Kidney transplant status: Secondary | ICD-10-CM

## 2021-11-16 DIAGNOSIS — Z7984 Long term (current) use of oral hypoglycemic drugs: Secondary | ICD-10-CM

## 2021-11-16 DIAGNOSIS — Y830 Surgical operation with transplant of whole organ as the cause of abnormal reaction of the patient, or of later complication, without mention of misadventure at the time of the procedure: Secondary | ICD-10-CM | POA: Diagnosis present

## 2021-11-16 DIAGNOSIS — I5043 Acute on chronic combined systolic (congestive) and diastolic (congestive) heart failure: Secondary | ICD-10-CM

## 2021-11-16 DIAGNOSIS — D509 Iron deficiency anemia, unspecified: Secondary | ICD-10-CM | POA: Diagnosis present

## 2021-11-16 DIAGNOSIS — F17290 Nicotine dependence, other tobacco product, uncomplicated: Secondary | ICD-10-CM | POA: Diagnosis present

## 2021-11-16 DIAGNOSIS — I4819 Other persistent atrial fibrillation: Secondary | ICD-10-CM | POA: Diagnosis present

## 2021-11-16 DIAGNOSIS — Z515 Encounter for palliative care: Secondary | ICD-10-CM

## 2021-11-16 DIAGNOSIS — N184 Chronic kidney disease, stage 4 (severe): Secondary | ICD-10-CM | POA: Diagnosis present

## 2021-11-16 DIAGNOSIS — Z9181 History of falling: Secondary | ICD-10-CM

## 2021-11-16 DIAGNOSIS — I130 Hypertensive heart and chronic kidney disease with heart failure and stage 1 through stage 4 chronic kidney disease, or unspecified chronic kidney disease: Secondary | ICD-10-CM | POA: Diagnosis present

## 2021-11-16 DIAGNOSIS — Z7901 Long term (current) use of anticoagulants: Secondary | ICD-10-CM

## 2021-11-16 DIAGNOSIS — I214 Non-ST elevation (NSTEMI) myocardial infarction: Principal | ICD-10-CM | POA: Diagnosis present

## 2021-11-16 DIAGNOSIS — Z66 Do not resuscitate: Secondary | ICD-10-CM | POA: Diagnosis present

## 2021-11-16 DIAGNOSIS — I10 Essential (primary) hypertension: Secondary | ICD-10-CM | POA: Diagnosis present

## 2021-11-16 DIAGNOSIS — Z6830 Body mass index (BMI) 30.0-30.9, adult: Secondary | ICD-10-CM

## 2021-11-16 DIAGNOSIS — Z7952 Long term (current) use of systemic steroids: Secondary | ICD-10-CM

## 2021-11-16 DIAGNOSIS — E785 Hyperlipidemia, unspecified: Secondary | ICD-10-CM | POA: Diagnosis present

## 2021-11-16 DIAGNOSIS — T8619 Other complication of kidney transplant: Secondary | ICD-10-CM | POA: Diagnosis present

## 2021-11-16 DIAGNOSIS — Z882 Allergy status to sulfonamides status: Secondary | ICD-10-CM

## 2021-11-16 DIAGNOSIS — Z888 Allergy status to other drugs, medicaments and biological substances status: Secondary | ICD-10-CM

## 2021-11-16 DIAGNOSIS — Z832 Family history of diseases of the blood and blood-forming organs and certain disorders involving the immune mechanism: Secondary | ICD-10-CM

## 2021-11-16 DIAGNOSIS — Z794 Long term (current) use of insulin: Secondary | ICD-10-CM

## 2021-11-16 DIAGNOSIS — Z8249 Family history of ischemic heart disease and other diseases of the circulatory system: Secondary | ICD-10-CM

## 2021-11-16 DIAGNOSIS — Z792 Long term (current) use of antibiotics: Secondary | ICD-10-CM

## 2021-11-16 DIAGNOSIS — Z79899 Other long term (current) drug therapy: Secondary | ICD-10-CM

## 2021-11-16 DIAGNOSIS — Z8673 Personal history of transient ischemic attack (TIA), and cerebral infarction without residual deficits: Secondary | ICD-10-CM

## 2021-11-16 DIAGNOSIS — Z881 Allergy status to other antibiotic agents status: Secondary | ICD-10-CM

## 2021-11-16 DIAGNOSIS — E875 Hyperkalemia: Secondary | ICD-10-CM | POA: Diagnosis present

## 2021-11-16 DIAGNOSIS — R531 Weakness: Secondary | ICD-10-CM | POA: Diagnosis present

## 2021-11-16 DIAGNOSIS — I5020 Unspecified systolic (congestive) heart failure: Secondary | ICD-10-CM | POA: Diagnosis not present

## 2021-11-16 DIAGNOSIS — E871 Hypo-osmolality and hyponatremia: Secondary | ICD-10-CM | POA: Diagnosis present

## 2021-11-16 DIAGNOSIS — N183 Chronic kidney disease, stage 3 unspecified: Secondary | ICD-10-CM | POA: Diagnosis not present

## 2021-11-16 DIAGNOSIS — E872 Acidosis, unspecified: Secondary | ICD-10-CM | POA: Diagnosis present

## 2021-11-16 DIAGNOSIS — Z7982 Long term (current) use of aspirin: Secondary | ICD-10-CM

## 2021-11-16 DIAGNOSIS — R062 Wheezing: Secondary | ICD-10-CM | POA: Diagnosis present

## 2021-11-16 DIAGNOSIS — Z833 Family history of diabetes mellitus: Secondary | ICD-10-CM

## 2021-11-16 DIAGNOSIS — I2510 Atherosclerotic heart disease of native coronary artery without angina pectoris: Secondary | ICD-10-CM | POA: Diagnosis present

## 2021-11-16 LAB — CBC
HCT: 32 % — ABNORMAL LOW (ref 39.0–52.0)
Hemoglobin: 9.9 g/dL — ABNORMAL LOW (ref 13.0–17.0)
MCH: 28.5 pg (ref 26.0–34.0)
MCHC: 30.9 g/dL (ref 30.0–36.0)
MCV: 92.2 fL (ref 80.0–100.0)
Platelets: 153 10*3/uL (ref 150–400)
RBC: 3.47 MIL/uL — ABNORMAL LOW (ref 4.22–5.81)
RDW: 15 % (ref 11.5–15.5)
WBC: 5.3 10*3/uL (ref 4.0–10.5)
nRBC: 0 % (ref 0.0–0.2)

## 2021-11-16 LAB — I-STAT CHEM 8, ED
BUN: 48 mg/dL — ABNORMAL HIGH (ref 8–23)
Calcium, Ion: 1.03 mmol/L — ABNORMAL LOW (ref 1.15–1.40)
Chloride: 109 mmol/L (ref 98–111)
Creatinine, Ser: 2 mg/dL — ABNORMAL HIGH (ref 0.61–1.24)
Glucose, Bld: 145 mg/dL — ABNORMAL HIGH (ref 70–99)
HCT: 27 % — ABNORMAL LOW (ref 39.0–52.0)
Hemoglobin: 9.2 g/dL — ABNORMAL LOW (ref 13.0–17.0)
Potassium: 6.3 mmol/L (ref 3.5–5.1)
Sodium: 133 mmol/L — ABNORMAL LOW (ref 135–145)
TCO2: 19 mmol/L — ABNORMAL LOW (ref 22–32)

## 2021-11-16 LAB — CBG MONITORING, ED: Glucose-Capillary: 142 mg/dL — ABNORMAL HIGH (ref 70–99)

## 2021-11-16 LAB — BASIC METABOLIC PANEL
Anion gap: 7 (ref 5–15)
BUN: 40 mg/dL — ABNORMAL HIGH (ref 8–23)
CO2: 19 mmol/L — ABNORMAL LOW (ref 22–32)
Calcium: 7.7 mg/dL — ABNORMAL LOW (ref 8.9–10.3)
Chloride: 109 mmol/L (ref 98–111)
Creatinine, Ser: 1.96 mg/dL — ABNORMAL HIGH (ref 0.61–1.24)
GFR, Estimated: 35 mL/min — ABNORMAL LOW (ref >60)
Glucose, Bld: 160 mg/dL — ABNORMAL HIGH (ref 70–99)
Potassium: 6.4 mmol/L (ref 3.5–5.1)
Sodium: 135 mmol/L (ref 135–145)

## 2021-11-16 LAB — RAPID URINE DRUG SCREEN, HOSP PERFORMED
Amphetamines: NOT DETECTED
Barbiturates: NOT DETECTED
Benzodiazepines: POSITIVE — AB
Cocaine: NOT DETECTED
Opiates: NOT DETECTED
Tetrahydrocannabinol: NOT DETECTED

## 2021-11-16 LAB — TROPONIN I (HIGH SENSITIVITY)
Troponin I (High Sensitivity): 672 ng/L (ref <18)
Troponin I (High Sensitivity): 1331 ng/L (ref <18)

## 2021-11-16 LAB — PROTIME-INR
INR: 1.5 — ABNORMAL HIGH (ref 0.8–1.2)
Prothrombin Time: 17.6 seconds — ABNORMAL HIGH (ref 11.4–15.2)

## 2021-11-16 LAB — SARS CORONAVIRUS 2 BY RT PCR: SARS Coronavirus 2 by RT PCR: NEGATIVE

## 2021-11-16 LAB — APTT: aPTT: 29 seconds (ref 24–36)

## 2021-11-16 MED ORDER — ONDANSETRON HCL 4 MG/2ML IJ SOLN
4.0000 mg | Freq: Four times a day (QID) | INTRAMUSCULAR | Status: DC | PRN
  Administered 2021-11-17: 4 mg via INTRAVENOUS
  Filled 2021-11-16: qty 2

## 2021-11-16 MED ORDER — HEPARIN (PORCINE) 25000 UT/250ML-% IV SOLN
1250.0000 [IU]/h | INTRAVENOUS | Status: DC
  Administered 2021-11-16 – 2021-11-17 (×2): 1250 [IU]/h via INTRAVENOUS
  Filled 2021-11-16 (×2): qty 250

## 2021-11-16 MED ORDER — SODIUM CHLORIDE 0.9 % IV SOLN: 250.0000 mL | INTRAVENOUS | Status: DC | PRN

## 2021-11-16 MED ORDER — ALBUTEROL SULFATE HFA 108 (90 BASE) MCG/ACT IN AERS: 2.0000 | INHALATION_SPRAY | Freq: Four times a day (QID) | RESPIRATORY_TRACT | Status: DC | PRN

## 2021-11-16 MED ORDER — ALPRAZOLAM 0.25 MG PO TABS
0.2500 mg | ORAL_TABLET | Freq: Every evening | ORAL | Status: DC | PRN
  Administered 2021-11-17 – 2021-11-18 (×2): 0.25 mg via ORAL
  Filled 2021-11-16 (×2): qty 1

## 2021-11-16 MED ORDER — SODIUM ZIRCONIUM CYCLOSILICATE 5 G PO PACK
10.0000 g | PACK | Freq: Once | ORAL | Status: AC
  Administered 2021-11-16: 10 g via ORAL
  Filled 2021-11-16: qty 2

## 2021-11-16 MED ORDER — METOPROLOL SUCCINATE ER 50 MG PO TB24
100.0000 mg | ORAL_TABLET | Freq: Every day | ORAL | Status: DC
  Administered 2021-11-17 – 2021-11-20 (×4): 100 mg via ORAL
  Filled 2021-11-16 (×4): qty 2

## 2021-11-16 MED ORDER — SULFAMETHOXAZOLE-TRIMETHOPRIM 400-80 MG PO TABS
1.0000 | ORAL_TABLET | ORAL | Status: DC
  Filled 2021-11-16 (×2): qty 1

## 2021-11-16 MED ORDER — NITROGLYCERIN 0.4 MG SL SUBL: 0.4000 mg | SUBLINGUAL_TABLET | SUBLINGUAL | Status: DC | PRN

## 2021-11-16 MED ORDER — ACETAMINOPHEN 325 MG PO TABS: 650.0000 mg | ORAL_TABLET | ORAL | Status: DC | PRN

## 2021-11-16 MED ORDER — TACROLIMUS 0.5 MG PO CAPS
1.0000 mg | ORAL_CAPSULE | Freq: Two times a day (BID) | ORAL | Status: DC
  Administered 2021-11-16 – 2021-11-20 (×8): 1 mg via ORAL
  Filled 2021-11-16 (×2): qty 2
  Filled 2021-11-16 (×8): qty 1

## 2021-11-16 MED ORDER — AMIODARONE HCL 200 MG PO TABS
100.0000 mg | ORAL_TABLET | Freq: Every day | ORAL | Status: DC
  Administered 2021-11-17 – 2021-11-20 (×4): 100 mg via ORAL
  Filled 2021-11-16 (×4): qty 1

## 2021-11-16 MED ORDER — HEPARIN BOLUS VIA INFUSION
2000.0000 [IU] | Freq: Once | INTRAVENOUS | Status: AC
  Administered 2021-11-16: 2000 [IU] via INTRAVENOUS

## 2021-11-16 MED ORDER — ALBUTEROL SULFATE (2.5 MG/3ML) 0.083% IN NEBU
2.5000 mg | INHALATION_SOLUTION | Freq: Four times a day (QID) | RESPIRATORY_TRACT | Status: DC | PRN
  Administered 2021-11-17: 2.5 mg via RESPIRATORY_TRACT
  Filled 2021-11-16: qty 3

## 2021-11-16 MED ORDER — FLUTICASONE PROPIONATE 50 MCG/ACT NA SUSP
1.0000 | Freq: Every day | NASAL | Status: DC
  Administered 2021-11-17 – 2021-11-20 (×4): 1 via NASAL
  Filled 2021-11-16 (×3): qty 16

## 2021-11-16 MED ORDER — SODIUM CHLORIDE 0.9% FLUSH
3.0000 mL | Freq: Two times a day (BID) | INTRAVENOUS | Status: DC
  Administered 2021-11-16 – 2021-11-20 (×8): 3 mL via INTRAVENOUS

## 2021-11-16 MED ORDER — HYDROCODONE-ACETAMINOPHEN 5-325 MG PO TABS: 1.0000 | ORAL_TABLET | ORAL | Status: DC | PRN

## 2021-11-16 MED ORDER — INSULIN NPH (HUMAN) (ISOPHANE) 100 UNIT/ML ~~LOC~~ SUSP
10.0000 [IU] | Freq: Two times a day (BID) | SUBCUTANEOUS | Status: DC
  Administered 2021-11-17 – 2021-11-20 (×7): 10 [IU] via SUBCUTANEOUS
  Filled 2021-11-16: qty 10

## 2021-11-16 MED ORDER — EZETIMIBE 10 MG PO TABS
10.0000 mg | ORAL_TABLET | Freq: Every day | ORAL | Status: DC
  Administered 2021-11-17 – 2021-11-20 (×4): 10 mg via ORAL
  Filled 2021-11-16 (×4): qty 1

## 2021-11-16 MED ORDER — SODIUM CHLORIDE 0.9% FLUSH: 3.0000 mL | INTRAVENOUS | Status: DC | PRN

## 2021-11-16 MED ORDER — SODIUM BICARBONATE 650 MG PO TABS
1300.0000 mg | ORAL_TABLET | Freq: Two times a day (BID) | ORAL | Status: DC
  Administered 2021-11-16 – 2021-11-20 (×8): 1300 mg via ORAL
  Filled 2021-11-16 (×8): qty 2

## 2021-11-16 MED ORDER — SODIUM ZIRCONIUM CYCLOSILICATE 5 G PO PACK: 10.0000 g | PACK | Freq: Every day | ORAL | Status: DC

## 2021-11-16 MED ORDER — INSULIN ASPART 100 UNIT/ML IJ SOLN
0.0000 [IU] | Freq: Three times a day (TID) | INTRAMUSCULAR | Status: DC
  Administered 2021-11-17: 1 [IU] via SUBCUTANEOUS
  Administered 2021-11-17: 2 [IU] via SUBCUTANEOUS
  Administered 2021-11-17: 1 [IU] via SUBCUTANEOUS
  Administered 2021-11-18: 2 [IU] via SUBCUTANEOUS
  Administered 2021-11-18 (×2): 1 [IU] via SUBCUTANEOUS
  Administered 2021-11-19: 2 [IU] via SUBCUTANEOUS
  Administered 2021-11-19: 3 [IU] via SUBCUTANEOUS
  Administered 2021-11-20: 1 [IU] via SUBCUTANEOUS
  Filled 2021-11-16 (×2): qty 1

## 2021-11-16 MED ORDER — MYCOPHENOLATE MOFETIL 250 MG PO CAPS
500.0000 mg | ORAL_CAPSULE | Freq: Two times a day (BID) | ORAL | Status: DC
  Administered 2021-11-16 – 2021-11-20 (×8): 500 mg via ORAL
  Filled 2021-11-16 (×10): qty 2

## 2021-11-16 MED ORDER — ATORVASTATIN CALCIUM 40 MG PO TABS
40.0000 mg | ORAL_TABLET | Freq: Every day | ORAL | Status: DC
  Administered 2021-11-17 – 2021-11-20 (×4): 40 mg via ORAL
  Filled 2021-11-16 (×4): qty 1

## 2021-11-16 MED ORDER — PREDNISONE 10 MG PO TABS
5.0000 mg | ORAL_TABLET | Freq: Every day | ORAL | Status: DC
  Administered 2021-11-17 – 2021-11-20 (×4): 5 mg via ORAL
  Filled 2021-11-16 (×4): qty 1

## 2021-11-16 NOTE — ED Notes (Signed)
Date and time results received: 11/16/21 2011    (use smartphrase ".now" to insert current time)  Test: troponin  Critical Value: 672   Name of Provider Notified: [email or web address]  Orders Received? Or Actions Taken?:

## 2021-11-16 NOTE — ED Notes (Signed)
Date and time results received: 11/16/21 2212 (use smartphrase ".now" to insert current time)  Test: troponin Critical Value: 1331  Name of Provider Notified: [email or web address]  Orders Received? Or Actions Taken?:

## 2021-11-16 NOTE — H&P (Signed)
History and Physical    Patient: Daniel Myers NFA:[id number] DOB: 11-Jan-1946 DOA: 11/16/2021 DOS: the patient was seen and examined on 11/16/2021 PCP: Claretta Fraise, MD  Patient coming from: Home - lives alone; NOK: Daughter (lives next door), Anastasia Pall, [phone]   Chief Complaint: Chest pain  HPI: TAKSH HJORT is a 76 y.o. male with medical history significant of afib; renal transplant; HTN; HLD; CAD; DM; and afib presenting with chest pain.  He reports that he fell a few weeks ago (9/21) and had a rib fracture.  Last week, he had URI symptoms with a cough; his daughter now reports that she had COVID although he was not tested.  He developed acute left-sided CP about 1-2 pm today.  It eventually resolved spontaneously.  It felt different than it did last year when he had an NSTEMI.  At that time, he had a cath with severe 3-vessel disease.  He was deemed to not be a candidate for PCI and was referred for consideration of CABG but he declined cath and reports that he would decline any kind of intervention at this time due to fear of endangering his renal transplant.  As such, he prefers to stay at [facility] for heparin drip and cardiology consult here rather than to be transferred.  He does add that he has had recent (about 3 weeks) issues with hyperkalemia.    ER Course:  h/o CABG 1 year ago.  CP today, troponin elevated.  Spoke with cardiology - Ivanoff.  Starting heparin, didn't take Eliquis today.  Admit to [facility].     Review of Systems: As mentioned in the history of present illness. All other systems reviewed and are negative. 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   nodular on left jawline - excision   Basal cell carcinoma 07/01/2014   nodular on left hawling - CX3+5FU+excision   Basal cell carcinoma 10/10/2014   nodular on right forearm, middle - tx p bx   Basal cell carcinoma 02/11/2015   left neck - CX3 + excision   Basal cell carcinoma  06/14/2016   left jawline - CX3+5FU   Basal cell carcinoma 02/22/2017   superficial and nodular on left neck - excision   Basal cell carcinoma 04/11/2018   superficial and nodular on right inferior forearm - CX3+Cautery+5FU   Chronic kidney disease    History of renal transplant    Hyperlipidemia    Hypertension    NSTEMI (non-ST elevated myocardial infarction) ([place]) 02/13/2020   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   Left Malar Cheek (well diff)   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   Right Breast (in situ)   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   Left Forearm - anterior (mod diff)   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   Neck - anterior (mod diff)   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   in situ on left arm - CX3+5FU   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   hypertrophic on left ear - CX3+5FU   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   in situ on right temple - CX3+5FU   Squamous cell carcinoma of skin 11/08/2011   right upper forearm - tx p bx   Squamous cell carcinoma of skin 11/08/2011   left upper forearm - tx p bx   Squamous cell carcinoma of skin 11/08/2011   left hand - tx p bx   Squamous  cell carcinoma of skin 06/27/2013   in situ on left lower back - CX3+5FU   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/27/2013   in situ on left forehead - CX3+5FU   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/27/2013   in situ on right temple - watch per ST   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/27/2013   in situ on right forearm - CX3+5FU   Squamous cell carcinoma of skin 07/01/2014   well differentiated on right sideburn - CX3+5FU   Squamous cell carcinoma of skin 07/01/2014   in situ on left shoulder - CX3+5FU   Squamous cell carcinoma of skin 07/01/2014   in situ on right forearm, proximal - CX3+5FU+Cautery   Squamous cell carcinoma of skin 07/01/2014   in situ on right forearm, distal - CX3+5FU   Squamous cell carcinoma of  skin 09/30/2015   in situ on posterior left ear - CX3+5FU   Squamous cell carcinoma of skin 02/22/2017   in situ on right upper arm - CX3+5FU   Squamous cell carcinoma of skin 02/22/2017   in situ on left upper arm - CX3+5FU   Squamous cell carcinoma of skin 04/11/2018   in situ on right temple - CX3+5FU   Squamous cell carcinoma of skin 04/11/2018   in situ on lateral right arm - MOHs   Squamous cell carcinoma of skin 04/11/2018   in situ on right upper arm - MOHs   Squamous cell carcinoma of skin 04/11/2018   in situ on left sideburn   Squamous cell carcinoma of skin 04/11/2018   in situ on right flank - tx p bx   Squamous cell carcinoma of skin 09/28/2018   in situ on right inner ear - tx p bx   Squamous cell carcinoma of skin 09/28/2018   in situ on left inner ear - tx p bx   Squamous cell carcinoma of skin 09/28/2018   in situ on right arm - tx p bx   Squamous cell carcinoma of skin 03/21/2019   in situ on right antihelix (Mitkov treated topically)   Squamous cell carcinoma of skin 03/21/2019   in situ on right neck - CX3+5FU   Squamous cell carcinoma of skin 03/21/2019   in situ on left outer eye, inf (MOHs done 05/23/2019)   Type 2 diabetes mellitus ([place])    Unspecified atrial fibrillation ([place]) 02/17/2020   Past Surgical History:  Procedure Laterality Date   CARDIOVERSION N/A 10/16/2020   Procedure: CARDIOVERSION;  Surgeon: Larey Dresser, MD;  Location: [facility] ENDOSCOPY;  Service: Cardiovascular;  Laterality: N/A;   KIDNEY TRANSPLANT Right    RIGHT/LEFT HEART CATH AND CORONARY ANGIOGRAPHY N/A 09/09/2020   Procedure: RIGHT/LEFT HEART CATH AND CORONARY ANGIOGRAPHY;  Surgeon: Larey Dresser, MD;  Location: [place] CV LAB;  Service: Cardiovascular;  Laterality: N/A;   Social History:  reports that he has been smoking pipe. He has never used smokeless tobacco. He reports that he does not drink alcohol and does not use drugs.  Allergies  Allergen Reactions   Tape Rash     Use paper tape.    Trazodone And Nefazodone Palpitations    tachycardia   Mirtazapine     imbalance   Elemental Sulfur Rash   Sulfa Antibiotics Rash    Family History  Problem Relation Age of Onset   Clotting disorder Mother    Hypertension Sister    Diabetes Sister     Prior to Admission medications   Medication Sig  Start Date End Date Taking? Authorizing Provider  acetaminophen (TYLENOL) 500 MG tablet Take 500 mg by mouth every 8 (eight) hours as needed for moderate pain.    [provider]  albuterol (VENTOLIN HFA) 108 (90 Base) MCG/ACT inhaler Inhale 2 puffs into the lungs every 6 (six) hours as needed for wheezing or shortness of breath. 11/13/21   Claretta Fraise, MD  ALPRAZolam Duanne Moron) 0.25 MG tablet Take 1 tablet (0.25 mg total) by mouth at bedtime as needed for anxiety. 08/05/21   Claretta Fraise, MD  amiodarone (PACERONE) 200 MG tablet Take 0.5 tablets (100 mg total) by mouth daily. 10/14/21   Larey Dresser, MD  atorvastatin (LIPITOR) 40 MG tablet Take 1 tablet (40 mg total) by mouth daily. 11/10/21   Claretta Fraise, MD  dapagliflozin propanediol (FARXIGA) 10 MG TABS tablet Take 1 tablet (10 mg total) by mouth daily before breakfast. 02/25/21   Claretta Fraise, MD  ELIQUIS 5 MG TABS tablet Take 1 tablet by mouth twice daily 11/09/21   Larey Dresser, MD  ezetimibe (ZETIA) 10 MG tablet Take 1 tablet (10 mg total) by mouth daily. 08/17/21   Satira Sark, MD  fluticasone Asencion Islam) 50 MCG/ACT nasal spray USE ONE SPRAY(S) IN EACH NOSTRIL ONCE DAILY 05/07/20   Claretta Fraise, MD  furosemide (LASIX) 20 MG tablet Take 1 tablet (20 mg total) by mouth every other day. 10/14/21   Larey Dresser, MD  HYDROcodone-acetaminophen (NORCO/VICODIN) 5-325 MG tablet Take 1 tablet by mouth every 4 (four) hours as needed. 10/29/21   Isla Pence, MD  insulin NPH Human (NOVOLIN N) 100 UNIT/ML injection 10 units AC breakfast and 10 units AC supper 08/05/21   Claretta Fraise, MD  insulin  regular (NOVOLIN R) 100 units/mL injection 16 units w bkfst and 13 units w dinner 04/30/21   Claretta Fraise, MD  lisinopril (ZESTRIL) 5 MG tablet Take 1 tablet (5 mg total) by mouth daily. 05/04/21   Claretta Fraise, MD  loratadine (CLARITIN) 10 MG tablet Take 10 mg by mouth daily as needed for allergies. 10/24/06   [provider]  metoprolol succinate (TOPROL-XL) 100 MG 24 hr tablet Take 1 tablet (100 mg total) by mouth daily. Take with or immediately following a meal. 08/05/21   Claretta Fraise, MD  mycophenolate (CELLCEPT) 250 MG capsule Take 500 mg by mouth 2 (two) times daily. 06/10/14   [provider]  nitroGLYCERIN (NITROSTAT) 0.4 MG SL tablet Place 1 tablet (0.4 mg total) under the tongue every 5 (five) minutes as needed for chest pain. 11/05/20   Larey Dresser, MD  ondansetron (ZOFRAN) 4 MG tablet Take 1 tablet (4 mg total) by mouth every 8 (eight) hours as needed for nausea or vomiting. 04/17/20   Claretta Fraise, MD  predniSONE (DELTASONE) 5 MG tablet Take 5 mg by mouth daily with breakfast.    [provider]  Morris X 15/64" 0.5 ML MISC USE AS DIRECTED FOR INSULIN 07/04/18   [provider]  sodium bicarbonate 650 MG tablet Take 1,300 mg by mouth 2 (two) times daily. 09/26/18   [provider]  sodium zirconium cyclosilicate (LOKELMA) 10 g PACK packet Take 10 g by mouth as directed. By the HF clinic 10/27/21   Larey Dresser, MD  spironolactone (ALDACTONE) 25 MG tablet Take 12.5 mg by mouth daily. 10/14/21   [provider]  sulfamethoxazole-trimethoprim (BACTRIM) 400-80 MG tablet Take 1 tablet by mouth every Monday, Wednesday, and Friday.  [provider]  tacrolimus (PROGRAF) 0.5 MG capsule Take 1 mg by mouth in the morning and at bedtime. 07/08/15   [provider]  Vericiguat (VERQUVO) 2.5 MG TABS Take 2.5 mg by mouth daily.    [provider]    Physical Exam: Vitals:   11/16/21 1932  11/16/21 1945 11/16/21 1948 11/16/21 2015  BP:  (!) 134/50 (!) 134/50 (!) 136/54  Pulse:  (!) 55 (!) 55 (!) 56  Resp:   19 12  Temp: 98.5 F (36.9 C)  98.5 F (36.9 C)   TempSrc: Oral     SpO2:  100% 100% 100%  Weight:      Height:       General:  Appears calm and comfortable and is in NAD Eyes:  PERRL, EOMI, normal lids, L conjunctival hemorrhage ENT:  grossly normal hearing, lips & tongue, mmm Neck:  no LAD, masses or thyromegaly Cardiovascular:  RR with mild bradycardia, no m/r/g. Tr LE edema.  Respiratory:   CTA bilaterally with no wheezes/rales/rhonchi.  Normal respiratory effort. Abdomen:  soft, NT, ND Skin:  no rash or induration seen on limited exam Musculoskeletal:  grossly normal tone BUE/BLE, good ROM, no bony abnormality Psychiatric:  grossly normal mood and affect, speech fluent and appropriate, AOx3 Neurologic:  CN 2-12 grossly intact, moves all extremities in coordinated fashion   Radiological Exams on Admission: Independently reviewed - see discussion in A/P where applicable  DG Chest 2 View  Result Date: 11/16/2021 CLINICAL DATA:  Chest pain, shortness of breath EXAM: CHEST - 2 VIEW COMPARISON:  10/29/2021 FINDINGS: Mild left lower lobe/retrocardiac opacity, atelectasis versus pneumonia. Mild right basilar opacity, likely atelectasis. No frank interstitial edema. No pleural effusion or pneumothorax. Mild cardiomegaly. IMPRESSION: Mild bilateral lower lobe opacities, atelectasis versus pneumonia. Mild cardiomegaly.  No frank interstitial edema. Electronically Signed   By: Julian Hy M.D.   On: 11/16/2021 19:47    EKG: Independently reviewed.  Afib with rate 69; nonspecific ST changes with no evidence of acute ischemia   Labs on Admission: I have personally reviewed the available labs and imaging studies at the time of the admission.  Pertinent labs:    K+ 6.4; 5.8 on 10/3 Glucose 160 BUN 40/Creatinine 1.96/GFR 35 - stable HS troponin 672 WBC 5.3 Hgb  9.9   Assessment and Plan: Principal Problem:   NSTEMI (non-ST elevated myocardial infarction) ([place]) Active Problems:   Essential hypertension   Insulin dependent type 2 diabetes mellitus ([place])   Renal transplant recipient   Mixed hyperlipidemia   CKD (chronic kidney disease) stage 4, GFR 15-29 ml/min (HCC)   Persistent atrial fibrillation (HCC)   Hyperkalemia   Goals of care, counseling/discussion   DNR (do not resuscitate)    NSTEMI -Patient with substernal chest pain that came on acutely at rest and improved spontaneously -Based on this history, this is not overly concerning for angina -However, patient has known severe CAD and refused CABG, was not a PCI candidate last year -His troponin is also significantly elevated, delta is pending -As such, this is very likely NSTEMI -He continues to refuse CABG and now also refuses cath -As such, staying at [facility] for medical management is reasonable -We discussed the risk - including progressive disease and death - and he prefers to stay at [facility] at this time -Will admit since the patient has positive troponins  -Cardiology consultation requested (the fellow recommended transfer to [facility], but this was declined by the patient) -Continue ASA 81 mg daily -  NTG for symptom relief (although there is no mortality benefit) -Continue beta blocker (not in HF or at risk for shock currently) -Will add CCB if ischemia persists despite max BB therapy (or there is a contraindication to BB) -Will plan to start Heparin drip -Supplemental O2  URI -Recent URI, and now daughter reports that she was positive for COVID last week - seems likely that he also has COVID -He is out of the window for treatment -The stress/demand from the URI may have led to his NSTEMI -He is currently on supplemental O2 and has a mild cough, but his O2 sats have not been documented as low - likely being used for improved O2 delivery in the setting of NSTEMI -CXR with BLL opacities,  again suspicious for COVID -Monitor without treatment for now  Stage 4 CKD s/p renal transplant -Continue immunosuppressants - prednisone daily, tacrolimus, mycophenolate, Bactrim for prophylaxis -Renal function actually appears slightly better than usual baseline -Attempt to avoid nephrotoxic medications including lisinopril, Farxiga -Continue sodium bicarbonate  Hyperkalemia -Subacute, likely related to renal dysfunction -No concerning EKG changes -Continue Lokelma BID for now and check BID BMP  Afib -Rate controlled with amiodarone, Toprol XL -On Eliquis as an outpatient, will likely resume post-hospitalization  Chronic combined CHF -Currently appears euvolemic -9/19 echo with EF 65-03%, grade 2 diastolic dysfunction -Hold Lasix and spironolactone, resume when appropriate -Hold Farxiga -Hold Vericiguat  HTN -Continue beta-blocker, as above -Will also add prn hydralazine  HLD -Continue Lipitor -Check lipids  DM -Last A1c was 6.2, good control -Continue NPH -Will cover with sensitive-scale SSI for now  Anxiety -Continue Xanax  Goals of care -Patient is DNR - I have discussed code status with the patient and his daughters and  they are in agreement that the patient would not desire resuscitation and would prefer to die a natural death should that situation arise. -He will need a gold out of facility DNR form at the time of discharge -He is also aware that be declining possible intervention for his presumed NSTEMI, he is at significant morbidity/mortality risk -He requests to remain at [facility] at this time and treat his NSTEMI medically despite the risks     Advance Care Planning:   Code Status: DNR   Consults: Cardiology  DVT Prophylaxis: Heparin drip  Family Communication: Daughters were present throughout evaluation  Severity of Illness: The appropriate patient status for this patient is INPATIENT. Inpatient status is judged to be reasonable and necessary in  order to provide the required intensity of service to ensure the patient's safety. The patient's presenting symptoms, physical exam findings, and initial radiographic and laboratory data in the context of their chronic comorbidities is felt to place them at high risk for further clinical deterioration. Furthermore, it is not anticipated that the patient will be medically stable for discharge from the hospital within 2 midnights of admission.   * I certify that at the point of admission it is my clinical judgment that the patient will require inpatient hospital care spanning beyond 2 midnights from the point of admission due to high intensity of service, high risk for further deterioration and high frequency of surveillance required.*  Author: Karmen Bongo, MD 11/16/2021 10:34 PM  For on call review [email or web address].

## 2021-11-16 NOTE — ED Notes (Signed)
Date and time results received: 11/16/21 1956 (use smartphrase ".now" to insert current time)  Test: Potassium Critical Value: 6.4  Name of Provider Notified: [email or web address]  Orders Received? Or Actions Taken?:

## 2021-11-16 NOTE — ED Triage Notes (Signed)
Pt brought in by [facility] for c/o chest pain that started today around 1:30; pt was diaphoretic and had some sob with wheezing

## 2021-11-16 NOTE — Progress Notes (Signed)
ANTICOAGULATION CONSULT NOTE - Initial Consult  Pharmacy Consult for IV Heparin Indication: chest pain/ACS  Allergies  Allergen Reactions   Tape Rash    Use paper tape.    Trazodone And Nefazodone Palpitations    tachycardia   Mirtazapine     imbalance   Elemental Sulfur Rash   Sulfa Antibiotics Rash    Patient Measurements: Height: '5\' 10"'$  (177.8 cm) Weight: 96.2 kg (212 lb) IBW/kg (Calculated) : 73 Heparin Dosing Weight: 92.7 kg  Vital Signs: Temp: 98.5 F (36.9 C) (10/09 1948) Temp Source: Oral (10/09 1932) BP: 136/54 (10/09 2015) Pulse Rate: 56 (10/09 2015)  Labs: Recent Labs    11/16/21 1910 11/16/21 1940 11/16/21 2016  HGB 9.9*  --  9.2*  HCT 32.0*  --  27.0*  PLT 153  --   --   LABPROT  --  17.6*  --   INR  --  1.5*  --   CREATININE 1.96*  --  2.00*  TROPONINIHS 672*  --   --     Estimated Creatinine Clearance: 37.1 mL/min (A) (by C-G formula based on SCr of 2 mg/dL (H)).   Medical History: 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   nodular on left jawline - excision   Basal cell carcinoma 07/01/2014   nodular on left hawling - CX3+5FU+excision   Basal cell carcinoma 10/10/2014   nodular on right forearm, middle - tx p bx   Basal cell carcinoma 02/11/2015   left neck - CX3 + excision   Basal cell carcinoma 06/14/2016   left jawline - CX3+5FU   Basal cell carcinoma 02/22/2017   superficial and nodular on left neck - excision   Basal cell carcinoma 04/11/2018   superficial and nodular on right inferior forearm - CX3+Cautery+5FU   Chronic kidney disease    History of renal transplant    Hyperlipidemia    Hypertension    NSTEMI (non-ST elevated myocardial infarction) ([place]) 02/13/2020   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   Left Malar Cheek (well diff)   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   Right Breast (in situ)   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   Left Forearm -  anterior (mod diff)   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   Neck - anterior (mod diff)   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   in situ on left arm - CX3+5FU   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   hypertrophic on left ear - CX3+5FU   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   in situ on right temple - CX3+5FU   Squamous cell carcinoma of skin 11/08/2011   right upper forearm - tx p bx   Squamous cell carcinoma of skin 11/08/2011   left upper forearm - tx p bx   Squamous cell carcinoma of skin 11/08/2011   left hand - tx p bx   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/27/2013   in situ on left lower back - CX3+5FU   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/27/2013   in situ on left forehead - CX3+5FU   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/27/2013   in situ on right temple - watch per ST   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/27/2013   in situ on right forearm - CX3+5FU   Squamous cell carcinoma of skin 07/01/2014   well differentiated on right sideburn - CX3+5FU   Squamous cell carcinoma of skin  07/01/2014   in situ on left shoulder - CX3+5FU   Squamous cell carcinoma of skin 07/01/2014   in situ on right forearm, proximal - CX3+5FU+Cautery   Squamous cell carcinoma of skin 07/01/2014   in situ on right forearm, distal - CX3+5FU   Squamous cell carcinoma of skin 09/30/2015   in situ on posterior left ear - CX3+5FU   Squamous cell carcinoma of skin 02/22/2017   in situ on right upper arm - CX3+5FU   Squamous cell carcinoma of skin 02/22/2017   in situ on left upper arm - CX3+5FU   Squamous cell carcinoma of skin 04/11/2018   in situ on right temple - CX3+5FU   Squamous cell carcinoma of skin 04/11/2018   in situ on lateral right arm - MOHs   Squamous cell carcinoma of skin 04/11/2018   in situ on right upper arm - MOHs   Squamous cell carcinoma of skin 04/11/2018   in situ on left sideburn   Squamous cell carcinoma of skin 04/11/2018   in situ on right flank  - tx p bx   Squamous cell carcinoma of skin 09/28/2018   in situ on right inner ear - tx p bx   Squamous cell carcinoma of skin 09/28/2018   in situ on left inner ear - tx p bx   Squamous cell carcinoma of skin 09/28/2018   in situ on right arm - tx p bx   Squamous cell carcinoma of skin 03/21/2019   in situ on right antihelix (Mitkov treated topically)   Squamous cell carcinoma of skin 03/21/2019   in situ on right neck - CX3+5FU   Squamous cell carcinoma of skin 03/21/2019   in situ on left outer eye, inf (MOHs done 05/23/2019)   Type 2 diabetes mellitus ([place])    Unspecified atrial fibrillation ([place]) 02/17/2020    Medications:  Scheduled:  Infusions:   Assessment: 76 years of age male with elevated troponin and past medical history significant for diabetes, atrial fibrillation, HTN, HLD, CKD s/p renal transplant, and NSTEMI. Pharmacy consulted for IV heparin therapy.   Patient takes Eliquis prior to admission for atrial fibrillation - last dose at [facility]. Due to this will be monitoring by aPTT as heparin level will be falsely elevated in setting of recent Eliquis.   High sensitivity troponin 672 Hemoglobin is 9.2 and platelets are 153 - appear to be stable based on prior labs.  SCr is elevated at 2 - appears to be baseline based on prior labs.  No bleeding reported.     Goal of Therapy:  Heparin level 0.3-0.7 units/ml aPTT 66-102 seconds Monitor platelets by anticoagulation protocol: Yes   Plan:  Baseline aPTT. Start IV Heparin small bolus 2000 units (since time out from last dose due of Apixaban and significantly elevated troponin), then start infusion at 1250 units/hr.  aPTT and heparin level in 8 hours.  Daily apTT and heparin level until correlating.  Daily CBC.   Sloan Leiter, PharmD Clinical Pharmacist Please refer to [facility] for [place] numbers 11/16/2021,9:19 PM

## 2021-11-16 NOTE — ED Provider Notes (Signed)
[facility] EMERGENCY DEPARTMENT Provider Note   CSN: [id number] Arrival date & time: 11/16/21  1722     History {Add pertinent medical, surgical, social history, OB history to HPI:1} Chief Complaint  Patient presents with   Chest Pain    Daniel Myers is a 76 y.o. male.  Patient has a history of coronary artery disease.  He refused bypass surgery years ago.  He started having chest discomfort today and has had shortness of breath.  He is not having any pain now   Chest Pain      Home Medications Prior to Admission medications   Medication Sig Start Date End Date Taking? Authorizing Provider  acetaminophen (TYLENOL) 500 MG tablet Take 500 mg by mouth every 8 (eight) hours as needed for moderate pain.    [provider]  albuterol (VENTOLIN HFA) 108 (90 Base) MCG/ACT inhaler Inhale 2 puffs into the lungs every 6 (six) hours as needed for wheezing or shortness of breath. 11/13/21   Claretta Fraise, MD  ALPRAZolam Duanne Moron) 0.25 MG tablet Take 1 tablet (0.25 mg total) by mouth at bedtime as needed for anxiety. 08/05/21   Claretta Fraise, MD  amiodarone (PACERONE) 200 MG tablet Take 0.5 tablets (100 mg total) by mouth daily. 10/14/21   Larey Dresser, MD  atorvastatin (LIPITOR) 40 MG tablet Take 1 tablet (40 mg total) by mouth daily. 11/10/21   Claretta Fraise, MD  dapagliflozin propanediol (FARXIGA) 10 MG TABS tablet Take 1 tablet (10 mg total) by mouth daily before breakfast. 02/25/21   Claretta Fraise, MD  ELIQUIS 5 MG TABS tablet Take 1 tablet by mouth twice daily 11/09/21   Larey Dresser, MD  ezetimibe (ZETIA) 10 MG tablet Take 1 tablet (10 mg total) by mouth daily. 08/17/21   Satira Sark, MD  fluticasone Asencion Islam) 50 MCG/ACT nasal spray USE ONE SPRAY(S) IN EACH NOSTRIL ONCE DAILY 05/07/20   Claretta Fraise, MD  furosemide (LASIX) 20 MG tablet Take 1 tablet (20 mg total) by mouth every other day. 10/14/21   Larey Dresser, MD  HYDROcodone-acetaminophen (NORCO/VICODIN)  5-325 MG tablet Take 1 tablet by mouth every 4 (four) hours as needed. 10/29/21   Isla Pence, MD  insulin NPH Human (NOVOLIN N) 100 UNIT/ML injection 10 units AC breakfast and 10 units AC supper 08/05/21   Claretta Fraise, MD  insulin regular (NOVOLIN R) 100 units/mL injection 16 units w bkfst and 13 units w dinner 04/30/21   Claretta Fraise, MD  lisinopril (ZESTRIL) 5 MG tablet Take 1 tablet (5 mg total) by mouth daily. 05/04/21   Claretta Fraise, MD  loratadine (CLARITIN) 10 MG tablet Take 10 mg by mouth daily as needed for allergies. 10/24/06   [provider]  metoprolol succinate (TOPROL-XL) 100 MG 24 hr tablet Take 1 tablet (100 mg total) by mouth daily. Take with or immediately following a meal. 08/05/21   Claretta Fraise, MD  mycophenolate (CELLCEPT) 250 MG capsule Take 500 mg by mouth 2 (two) times daily. 06/10/14   [provider]  nitroGLYCERIN (NITROSTAT) 0.4 MG SL tablet Place 1 tablet (0.4 mg total) under the tongue every 5 (five) minutes as needed for chest pain. 11/05/20   Larey Dresser, MD  ondansetron (ZOFRAN) 4 MG tablet Take 1 tablet (4 mg total) by mouth every 8 (eight) hours as needed for nausea or vomiting. 04/17/20   Claretta Fraise, MD  predniSONE (DELTASONE) 5 MG tablet Take 5 mg by mouth daily with breakfast.  [provider]  [place] X 15/64" 0.5 ML MISC USE AS DIRECTED FOR INSULIN 07/04/18   [provider]  sodium bicarbonate 650 MG tablet Take 1,300 mg by mouth 2 (two) times daily. 09/26/18   [provider]  sodium zirconium cyclosilicate (LOKELMA) 10 g PACK packet Take 10 g by mouth as directed. By the HF clinic 10/27/21   Larey Dresser, MD  spironolactone (ALDACTONE) 25 MG tablet Take 12.5 mg by mouth daily. 10/14/21   [provider]  sulfamethoxazole-trimethoprim (BACTRIM) 400-80 MG tablet Take 1 tablet by mouth every Monday, Wednesday, and Friday.    [provider]  tacrolimus (PROGRAF) 0.5  MG capsule Take 1 mg by mouth in the morning and at bedtime. 07/08/15   [provider]  Vericiguat (VERQUVO) 2.5 MG TABS Take 2.5 mg by mouth daily.    [provider]      Allergies    Tape, Trazodone and nefazodone, Mirtazapine, Elemental sulfur, and Sulfa antibiotics    Review of Systems   Review of Systems  Cardiovascular:  Positive for chest pain.    Physical Exam Updated Vital Signs BP (!) 136/54   Pulse (!) 56   Temp 98.5 F (36.9 C)   Resp 12   Ht '5\' 10"'$  (1.778 m)   Wt 96.2 kg   SpO2 100%   BMI 30.42 kg/m  Physical Exam  ED Results / Procedures / Treatments   Labs (all labs ordered are listed, but only abnormal results are displayed) Labs Reviewed  BASIC METABOLIC PANEL - Abnormal; Notable for the following components:      Result Value   Potassium 6.4 (*)    CO2 19 (*)    Glucose, Bld 160 (*)    BUN 40 (*)    Creatinine, Ser 1.96 (*)    Calcium 7.7 (*)    GFR, Estimated 35 (*)    All other components within normal limits  CBC - Abnormal; Notable for the following components:   RBC 3.47 (*)    Hemoglobin 9.9 (*)    HCT 32.0 (*)    All other components within normal limits  PROTIME-INR - Abnormal; Notable for the following components:   Prothrombin Time 17.6 (*)    INR 1.5 (*)    All other components within normal limits  I-STAT CHEM 8, ED - Abnormal; Notable for the following components:   Sodium 133 (*)    Potassium 6.3 (*)    BUN 48 (*)    Creatinine, Ser 2.00 (*)    Glucose, Bld 145 (*)    Calcium, Ion 1.03 (*)    TCO2 19 (*)    Hemoglobin 9.2 (*)    HCT 27.0 (*)    All other components within normal limits  TROPONIN I (HIGH SENSITIVITY) - Abnormal; Notable for the following components:   Troponin I (High Sensitivity) 672 (*)    All other components within normal limits  RAPID URINE DRUG SCREEN, HOSP PERFORMED  TROPONIN I (HIGH SENSITIVITY)    EKG EKG Interpretation  Date/Time:  Monday November 16 2021 17:38:30  EDT Ventricular Rate:  69 PR Interval:  203 QRS Duration: 114 QT Interval:  426 QTC Calculation: 457 R Axis:   57 Text Interpretation: Sinus rhythm Inferior infarct, old Anterolateral infarct, age indeterminate Confirmed by Milton Ferguson ([id number]) on 11/16/2021 8:14:50 PM  Radiology DG Chest 2 View  Result Date: 11/16/2021 CLINICAL DATA:  Chest pain, shortness of breath EXAM: CHEST - 2  VIEW COMPARISON:  10/29/2021 FINDINGS: Mild left lower lobe/retrocardiac opacity, atelectasis versus pneumonia. Mild right basilar opacity, likely atelectasis. No frank interstitial edema. No pleural effusion or pneumothorax. Mild cardiomegaly. IMPRESSION: Mild bilateral lower lobe opacities, atelectasis versus pneumonia. Mild cardiomegaly.  No frank interstitial edema. Electronically Signed   By: Julian Hy M.D.   On: 11/16/2021 19:47    Procedures Procedures  {Document cardiac monitor, telemetry assessment procedure when appropriate:1}  Medications Ordered in ED Medications  sodium zirconium cyclosilicate (LOKELMA) packet 10 g (10 g Oral Given 11/16/21 2110)    ED Course/ Medical Decision Making/ A&P                           Medical Decision Making Amount and/or Complexity of Data Reviewed Labs: ordered. Radiology: ordered.  Risk Prescription drug management. Decision regarding hospitalization.   Patient with an NSTEMI.  I spoke with cardiology Dr. Hinda Lenis and he wants the patient placed on heparin and admitted to [facility] by the hospitalist and he will consult  {Document critical care time when appropriate:1} {Document review of labs and clinical decision tools ie heart score, Chads2Vasc2 etc:1}  {Document your independent review of radiology images, and any outside records:1} {Document your discussion with family members, caretakers, and with consultants:1} {Document social determinants of health affecting pt's care:1} {Document your decision making why or why not  admission, treatments were needed:1} Final Clinical Impression(s) / ED Diagnoses Final diagnoses:  NSTEMI (non-ST elevated myocardial infarction) ([place])    Rx / DC Orders ED Discharge Orders     None

## 2021-11-17 ENCOUNTER — Encounter (HOSPITAL_COMMUNITY)
Admission: EM | Disposition: A | Discharge status: Home Health | Payer: Self-pay | Source: Home / Self Care | Attending: Family Medicine

## 2021-11-17 ENCOUNTER — Inpatient Hospital Stay (HOSPITAL_COMMUNITY): Payer: Medicare Other

## 2021-11-17 DIAGNOSIS — E785 Hyperlipidemia, unspecified: Secondary | ICD-10-CM

## 2021-11-17 DIAGNOSIS — I480 Paroxysmal atrial fibrillation: Secondary | ICD-10-CM

## 2021-11-17 DIAGNOSIS — I214 Non-ST elevation (NSTEMI) myocardial infarction: Secondary | ICD-10-CM | POA: Diagnosis not present

## 2021-11-17 DIAGNOSIS — I2510 Atherosclerotic heart disease of native coronary artery without angina pectoris: Secondary | ICD-10-CM | POA: Diagnosis present

## 2021-11-17 DIAGNOSIS — I5020 Unspecified systolic (congestive) heart failure: Secondary | ICD-10-CM

## 2021-11-17 DIAGNOSIS — N183 Chronic kidney disease, stage 3 unspecified: Secondary | ICD-10-CM

## 2021-11-17 DIAGNOSIS — E669 Obesity, unspecified: Secondary | ICD-10-CM | POA: Diagnosis present

## 2021-11-17 LAB — HEPARIN LEVEL (UNFRACTIONATED): Heparin Unfractionated: >1.1 IU/mL — ABNORMAL HIGH (ref 0.30–0.70)

## 2021-11-17 LAB — BASIC METABOLIC PANEL
Anion gap: 6 (ref 5–15)
Anion gap: 6 (ref 5–15)
BUN: 40 mg/dL — ABNORMAL HIGH (ref 8–23)
BUN: 42 mg/dL — ABNORMAL HIGH (ref 8–23)
CO2: 19 mmol/L — ABNORMAL LOW (ref 22–32)
CO2: 21 mmol/L — ABNORMAL LOW (ref 22–32)
Calcium: 7.8 mg/dL — ABNORMAL LOW (ref 8.9–10.3)
Calcium: 7.7 mg/dL — ABNORMAL LOW (ref 8.9–10.3)
Chloride: 109 mmol/L (ref 98–111)
Chloride: 106 mmol/L (ref 98–111)
Creatinine, Ser: 1.94 mg/dL — ABNORMAL HIGH (ref 0.61–1.24)
Creatinine, Ser: 2.06 mg/dL — ABNORMAL HIGH (ref 0.61–1.24)
GFR, Estimated: 35 mL/min — ABNORMAL LOW (ref >60)
GFR, Estimated: 33 mL/min — ABNORMAL LOW (ref >60)
Glucose, Bld: 141 mg/dL — ABNORMAL HIGH (ref 70–99)
Glucose, Bld: 139 mg/dL — ABNORMAL HIGH (ref 70–99)
Potassium: 5.7 mmol/L — ABNORMAL HIGH (ref 3.5–5.1)
Potassium: 5.7 mmol/L — ABNORMAL HIGH (ref 3.5–5.1)
Sodium: 134 mmol/L — ABNORMAL LOW (ref 135–145)
Sodium: 133 mmol/L — ABNORMAL LOW (ref 135–145)

## 2021-11-17 LAB — CBC
HCT: 29.1 % — ABNORMAL LOW (ref 39.0–52.0)
Hemoglobin: 8.7 g/dL — ABNORMAL LOW (ref 13.0–17.0)
MCH: 28.2 pg (ref 26.0–34.0)
MCHC: 29.9 g/dL — ABNORMAL LOW (ref 30.0–36.0)
MCV: 94.2 fL (ref 80.0–100.0)
Platelets: 151 10*3/uL (ref 150–400)
RBC: 3.09 MIL/uL — ABNORMAL LOW (ref 4.22–5.81)
RDW: 14.8 % (ref 11.5–15.5)
WBC: 3.1 10*3/uL — ABNORMAL LOW (ref 4.0–10.5)
nRBC: 0 % (ref 0.0–0.2)

## 2021-11-17 LAB — LIPID PANEL
Cholesterol: 72 mg/dL (ref 0–200)
HDL: 27 mg/dL — ABNORMAL LOW (ref >40)
LDL Cholesterol: 34 mg/dL (ref 0–99)
Total CHOL/HDL Ratio: 2.7 RATIO
Triglycerides: 55 mg/dL (ref <150)
VLDL: 11 mg/dL (ref 0–40)

## 2021-11-17 LAB — ECHOCARDIOGRAM COMPLETE
Area-P 1/2: 2.7 cm2
Height: 70 in
S' Lateral: 4.8 cm
Single Plane A4C EF: 23.4 %
Weight: 3392 oz

## 2021-11-17 LAB — CBG MONITORING, ED
Glucose-Capillary: 123 mg/dL — ABNORMAL HIGH (ref 70–99)
Glucose-Capillary: 134 mg/dL — ABNORMAL HIGH (ref 70–99)

## 2021-11-17 LAB — GLUCOSE, CAPILLARY
Glucose-Capillary: 171 mg/dL — ABNORMAL HIGH (ref 70–99)
Glucose-Capillary: 163 mg/dL — ABNORMAL HIGH (ref 70–99)

## 2021-11-17 LAB — APTT
aPTT: 76 seconds — ABNORMAL HIGH (ref 24–36)
aPTT: 69 seconds — ABNORMAL HIGH (ref 24–36)

## 2021-11-17 LAB — TROPONIN I (HIGH SENSITIVITY): Troponin I (High Sensitivity): 5874 ng/L (ref <18)

## 2021-11-17 SURGERY — LEFT HEART CATH AND CORONARY ANGIOGRAPHY
Anesthesia: LOCAL

## 2021-11-17 MED ORDER — SODIUM ZIRCONIUM CYCLOSILICATE 10 G PO PACK
10.0000 g | PACK | Freq: Two times a day (BID) | ORAL | Status: DC
  Administered 2021-11-17 (×2): 10 g via ORAL
  Filled 2021-11-17: qty 1
  Filled 2021-11-17: qty 2

## 2021-11-17 MED ORDER — PERFLUTREN LIPID MICROSPHERE
1.0000 mL | INTRAVENOUS | Status: AC | PRN
  Administered 2021-11-17: 2 mL via INTRAVENOUS

## 2021-11-17 MED ORDER — ASPIRIN 81 MG PO TBEC
81.0000 mg | DELAYED_RELEASE_TABLET | Freq: Every day | ORAL | Status: DC
  Administered 2021-11-17 – 2021-11-20 (×4): 81 mg via ORAL
  Filled 2021-11-17 (×4): qty 1

## 2021-11-17 MED ORDER — CHLORHEXIDINE GLUCONATE CLOTH 2 % EX PADS
6.0000 | MEDICATED_PAD | Freq: Every day | CUTANEOUS | Status: DC
  Administered 2021-11-18 – 2021-11-20 (×3): 6 via TOPICAL

## 2021-11-17 NOTE — Progress Notes (Signed)
ANTICOAGULATION CONSULT NOTE - Initial Consult  Pharmacy Consult for IV Heparin Indication: chest pain/ACS  Patient Measurements: Height: '5\' 10"'$  (177.8 cm) Weight: 96.2 kg (212 lb) IBW/kg (Calculated) : 73 Heparin Dosing Weight: 92.7 kg    Labs: Recent Labs    11/16/21 1910 11/16/21 1940 11/16/21 2016 11/16/21 2113 11/17/21 0500  HGB 9.9*  --  9.2*  --  8.7*  HCT 32.0*  --  27.0*  --  29.1*  PLT 153  --   --   --  151  APTT  --  29  --   --  76*  LABPROT  --  17.6*  --   --   --   INR  --  1.5*  --   --   --   HEPARINUNFRC  --   --   --   --  >1.10*  CREATININE 1.96*  --  2.00*  --  1.94*  TROPONINIHS 672*  --   --  1,331*  --      Estimated Creatinine Clearance: 38.3 mL/min (A) (by C-G formula based on SCr of 1.94 mg/dL (H)).  Assessment: 76 years of age male hx Afib on eliquis PTA, admitted with elevated troponin. PTT currently therapeutic. HL supra due to drug-lab interaction with eliquis. Will continue to monitory both PTT and HL until correlating.  Goal of Therapy:  Heparin level 0.3-0.7 units/ml aPTT 66-102 seconds Monitor platelets by anticoagulation protocol: Yes   Plan:  Continue Heparin gtt at 1250 units/hr HL in 8 hrs to confirm therapeutic level Daily apTT and heparin level until correlating.  Daily CBC.   Lorenso Courier, PharmD Clinical Pharmacist 11/17/2021 7:47 AM

## 2021-11-17 NOTE — Progress Notes (Signed)
Echocardiogram 2D Echocardiogram has been performed.  Oneal Deputy Brenin Heidelberger RDCS 11/17/2021, 10:36 AM

## 2021-11-17 NOTE — Consult Note (Addendum)
Cardiology Consultation   Patient ID: Daniel Myers MRN: [id number]; DOB: Apr 17, 1945  Admit date: 11/16/2021 Date of Consult: 11/17/2021  PCP:  Claretta Fraise, MD   [place] Providers Cardiologist:  Rozann Lesches, MD  AHF: Dr. Aundra Dubin  Patient Profile:   Daniel Myers is a 76 y.o. male with a hx of CAD (s/p cath in 09/2020 showing severe 3-vessel CAD and patient declined CABG and could possibly intervene on LAD but would be CTO and high-risk, therefore medical management recommended), HFrEF (EF 30-35% in 10/2021), paroxysmal atrial fibrillation (s/p DCCV in 10/2020 and has been continued on Amiodarone), HTN, HLD, Stage 3 CKD and history of CVA who is being seen 11/17/2021 for the evaluation of an NSTEMI at the request of Dr. Lorin Mercy.  History of Present Illness:   Daniel Myers was recently evaluated in the [facility] on 10/14/2021 and denied any specific anginal symptoms but his weight had increased by 3 lbs. He was continued on Lasix 20 mg daily, Lisinopril 5 mg daily (previously did not tolerate Entresto), Toprol-XL 100 mg daily, Farxiga 10 mg daily and Verquvo 2.5 mg daily (previous did not tolerate higher dosing). He was restarted on Spironolactone 12.5 mg daily. A follow-up echocardiogram was recommended and this showed his EF at 30 to 35% with mild reduced RV function and mild MR.  He presented to [facility] ED on 11/16/2021 for evaluation of chest pain which started earlier in the day. In talking with the patient today, he reports falling several weeks ago and fractured his ribs at that time. He has noted some dyspnea since. Yesterday while eating lunch, he started to have chest discomfort. He thought this might be secondary to acid reflux or rib pain. He took antacids but symptoms persisted. His dyspnea progressed throughout the day which prompted him to come to the ED for further evaluation. He reports receiving a breathing treatment last night and his dyspnea  and chest pain resolved with this. No recent orthopnea, PND or pitting edema.  Initial labs showed WBC 5.3, Hgb 9.9 (close to baseline), platelets 153, Na+ 135, K+ 6.4, and creatinine 1.96 (improved from prior values as this was at 2.08 to 2.89 in 10/2021). FLP shows total cholesterol of 72, triglycerides 55, HDL 27 and LDL 34. Negative for COVID and Influenza. Initial Hs Troponin elevated to 672 with repeat at 1331. CXR shows mild bilateral lower lobe opacities concerning for atelectasis or PNA. EKG shows NSR, HR 69 with inferior and anterior infarct pattern which is similar to prior tracings.   Repeat labs this AM show his creatinine has further improved to 1.94 but K+ remains elevated at 5.7.   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   nodular on left jawline - excision   Basal cell carcinoma 07/01/2014   nodular on left hawling - CX3+5FU+excision   Basal cell carcinoma 10/10/2014   nodular on right forearm, middle - tx p bx   Basal cell carcinoma 02/11/2015   left neck - CX3 + excision   Basal cell carcinoma 06/14/2016   left jawline - CX3+5FU   Basal cell carcinoma 02/22/2017   superficial and nodular on left neck - excision   Basal cell carcinoma 04/11/2018   superficial and nodular on right inferior forearm - CX3+Cautery+5FU   Chronic kidney disease    History of renal transplant    Hyperlipidemia    Hypertension    NSTEMI (non-ST elevated myocardial infarction) ([place]) 02/13/2020  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   Left [place] (well diff)   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   Right Breast (in situ)   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   Left Forearm - anterior (mod diff)   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   Neck - anterior (mod diff)   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   in situ on left arm - CX3+5FU   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   hypertrophic on  left ear - CX3+5FU   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   in situ on right temple - CX3+5FU   Squamous cell carcinoma of skin 11/08/2011   right upper forearm - tx p bx   Squamous cell carcinoma of skin 11/08/2011   left upper forearm - tx p bx   Squamous cell carcinoma of skin 11/08/2011   left hand - tx p bx   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/27/2013   in situ on left lower back - CX3+5FU   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/27/2013   in situ on left forehead - CX3+5FU   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/27/2013   in situ on right temple - watch per ST   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/27/2013   in situ on right forearm - CX3+5FU   Squamous cell carcinoma of skin 07/01/2014   well differentiated on right sideburn - CX3+5FU   Squamous cell carcinoma of skin 07/01/2014   in situ on left shoulder - CX3+5FU   Squamous cell carcinoma of skin 07/01/2014   in situ on right forearm, proximal - CX3+5FU+Cautery   Squamous cell carcinoma of skin 07/01/2014   in situ on right forearm, distal - CX3+5FU   Squamous cell carcinoma of skin 09/30/2015   in situ on posterior left ear - CX3+5FU   Squamous cell carcinoma of skin 02/22/2017   in situ on right upper arm - CX3+5FU   Squamous cell carcinoma of skin 02/22/2017   in situ on left upper arm - CX3+5FU   Squamous cell carcinoma of skin 04/11/2018   in situ on right temple - CX3+5FU   Squamous cell carcinoma of skin 04/11/2018   in situ on lateral right arm - MOHs   Squamous cell carcinoma of skin 04/11/2018   in situ on right upper arm - MOHs   Squamous cell carcinoma of skin 04/11/2018   in situ on left sideburn   Squamous cell carcinoma of skin 04/11/2018   in situ on right flank - tx p bx   Squamous cell carcinoma of skin 09/28/2018   in situ on right inner ear - tx p bx   Squamous cell carcinoma of skin 09/28/2018   in situ on left inner ear - tx p bx   Squamous cell carcinoma of skin 09/28/2018   in situ on right  arm - tx p bx   Squamous cell carcinoma of skin 03/21/2019   in situ on right antihelix ([place]kov treated topically)   Squamous cell carcinoma of skin 03/21/2019   in situ on right neck - CX3+5FU   Squamous cell carcinoma of skin 03/21/2019   in situ on left outer eye, inf (MOHs done 05/23/2019)   Type 2 diabetes mellitus ([place])    Unspecified atrial fibrillation ([place]) 02/17/2020    Past Surgical History:  Procedure Laterality Date   CARDIOVERSION N/A 10/16/2020   Procedure: CARDIOVERSION;  Surgeon: Larey Dresser, MD;  Location: [facility];  Service: Cardiovascular;  Laterality: N/A;   KIDNEY TRANSPLANT Right    RIGHT/LEFT HEART CATH AND CORONARY ANGIOGRAPHY N/A 09/09/2020   Procedure: RIGHT/LEFT HEART CATH AND CORONARY ANGIOGRAPHY;  Surgeon: Larey Dresser, MD;  Location: [facility];  Service: Cardiovascular;  Laterality: N/A;     Home Medications:  Prior to Admission medications   Medication Sig Start Date End Date Taking? Authorizing Provider  acetaminophen (TYLENOL) 500 MG tablet Take 500 mg by mouth every 8 (eight) hours as needed for moderate pain.   Yes [provider]  albuterol (VENTOLIN HFA) 108 (90 Base) MCG/ACT inhaler Inhale 2 puffs into the lungs every 6 (six) hours as needed for wheezing or shortness of breath. 11/13/21  Yes Stacks, [name], MD  ALPRAZolam Duanne Moron) 0.25 MG tablet Take 1 tablet (0.25 mg total) by mouth at bedtime as needed for anxiety. 08/05/21  Yes Stacks, [name], MD  amiodarone (PACERONE) 200 MG tablet Take 0.5 tablets (100 mg total) by mouth daily. 10/14/21  Yes Larey Dresser, MD  atorvastatin (LIPITOR) 40 MG tablet Take 1 tablet (40 mg total) by mouth daily. 11/10/21  Yes Claretta Fraise, MD  dapagliflozin propanediol (FARXIGA) 10 MG TABS tablet Take 1 tablet (10 mg total) by mouth daily before breakfast. 02/25/21  Yes Stacks, [name], MD  ELIQUIS 5 MG TABS tablet Take 1 tablet by mouth twice daily 11/09/21  Yes Larey Dresser, MD  ezetimibe  (ZETIA) 10 MG tablet Take 1 tablet (10 mg total) by mouth daily. 08/17/21  Yes Satira Sark, MD  fluticasone [facility]) 50 MCG/ACT nasal spray USE ONE SPRAY(S) IN EACH NOSTRIL ONCE DAILY 05/07/20  Yes Claretta Fraise, MD  furosemide (LASIX) 20 MG tablet Take 1 tablet (20 mg total) by mouth every other day. 10/14/21  Yes Larey Dresser, MD  insulin NPH Human (NOVOLIN N) 100 UNIT/ML injection 10 units AC breakfast and 10 units AC supper 08/05/21  Yes Stacks, [name], MD  insulin regular (NOVOLIN R) 100 units/mL injection 16 units w bkfst and 13 units w dinner 04/30/21  Yes Stacks, [name], MD  lisinopril (ZESTRIL) 5 MG tablet Take 1 tablet (5 mg total) by mouth daily. 05/04/21  Yes Stacks, [name], MD  loratadine (CLARITIN) 10 MG tablet Take 10 mg by mouth daily as needed for allergies. 10/24/06  Yes [provider]  metoprolol succinate (TOPROL-XL) 100 MG 24 hr tablet Take 1 tablet (100 mg total) by mouth daily. Take with or immediately following a meal. 08/05/21  Yes Stacks, [name], MD  mycophenolate (CELLCEPT) 250 MG capsule Take 500 mg by mouth 2 (two) times daily. 06/10/14  Yes [provider]  nitroGLYCERIN (NITROSTAT) 0.4 MG SL tablet Place 1 tablet (0.4 mg total) under the tongue every 5 (five) minutes as needed for chest pain. 11/05/20  Yes Larey Dresser, MD  ondansetron (ZOFRAN) 4 MG tablet Take 1 tablet (4 mg total) by mouth every 8 (eight) hours as needed for nausea or vomiting. 04/17/20  Yes Stacks, [name], MD  predniSONE (DELTASONE) 5 MG tablet Take 5 mg by mouth daily with breakfast.   Yes [provider]  sodium bicarbonate 650 MG tablet Take 1,300 mg by mouth 2 (two) times daily. 09/26/18  Yes [provider]  sodium zirconium cyclosilicate (LOKELMA) 10 g PACK packet Take 10 g by mouth as directed. By the HF clinic Patient taking differently: Take 10 g by mouth as directed. By the HF clinic just as needed 10/27/21  Yes  Larey Dresser, MD  spironolactone  (ALDACTONE) 25 MG tablet Take 12.5 mg by mouth daily. 10/14/21  Yes [provider]  sulfamethoxazole-trimethoprim (BACTRIM) 400-80 MG tablet Take 1 tablet by mouth every Monday, Wednesday, and Friday.   Yes [provider]  tacrolimus (PROGRAF) 0.5 MG capsule Take 1 mg by mouth in the morning and at bedtime. 07/08/15  Yes [provider]  Vericiguat (VERQUVO) 2.5 MG TABS Take 2.5 mg by mouth daily.   Yes [provider]  HYDROcodone-acetaminophen (NORCO/VICODIN) 5-325 MG tablet Take 1 tablet by mouth every 4 (four) hours as needed. Patient not taking: Reported on 11/17/2021 10/29/21   Isla Pence, MD  RELION INSULIN SYRINGE 31G X 15/64" 0.5 ML MISC USE AS DIRECTED FOR INSULIN 07/04/18   [provider]    Inpatient Medications: Scheduled Meds:  amiodarone  100 mg Oral Daily   aspirin EC  81 mg Oral Daily   atorvastatin  40 mg Oral Daily   ezetimibe  10 mg Oral Daily   fluticasone  1 spray Each Nare Daily   insulin aspart  0-9 Units Subcutaneous TID WC   insulin NPH Human  10 Units Subcutaneous BID AC & HS   metoprolol succinate  100 mg Oral Daily   mycophenolate  500 mg Oral BID   predniSONE  5 mg Oral Q breakfast   sodium bicarbonate  1,300 mg Oral BID   sodium chloride flush  3 mL Intravenous Q12H   sodium zirconium cyclosilicate  10 g Oral BID   [START ON 11/18/2021] sulfamethoxazole-trimethoprim  1 tablet Oral Q M,W,F   tacrolimus  1 mg Oral BID   Continuous Infusions:  sodium chloride     heparin 1,250 Units/hr (11/17/21 0646)   PRN Meds: sodium chloride, acetaminophen, albuterol, ALPRAZolam, HYDROcodone-acetaminophen, nitroGLYCERIN, ondansetron (ZOFRAN) IV, sodium chloride flush  Allergies:    Allergies  Allergen Reactions   Tape Rash    Use paper tape.    Trazodone And Nefazodone Palpitations    tachycardia   Mirtazapine     imbalance   Elemental Sulfur Rash   Sulfa Antibiotics Rash    Social History:   Social History    Socioeconomic History   Marital status: Widowed    Spouse name: Not on file   Number of children: 3   Years of education: Not on file   Highest education level: Not on file  Occupational History   Not on file  Tobacco Use   Smoking status: Some Days    Types: Pipe   Smokeless tobacco: Never  Vaping Use   Vaping Use: Never used  Substance and Sexual Activity   Alcohol use: Never   Drug use: Never   Sexual activity: Not Currently  Other Topics Concern   Not on file  Social History Narrative   Wife passed away in 09-May-2007.   2 daughters, 1 son. All live close by.    3 grandchildren.    Social Determinants of Health   Financial Resource Strain: Low Risk  (04/15/2021)   Overall Financial Resource Strain (CARDIA)    Difficulty of Paying Living Expenses: Not hard at all  Food Insecurity: No Food Insecurity (04/15/2021)   Hunger Vital Sign    Worried About Running Out of Food in the Last Year: Never true    Ran Out of Food in the Last Year: Never true  Transportation Needs: No Transportation Needs (04/15/2021)   PRAPARE - Hydrologist (Medical): No  Lack of Transportation (Non-Medical): No  Physical Activity: Insufficiently Active (04/15/2021)   Exercise Vital Sign    Days of Exercise per Week: 7 days    Minutes of Exercise per Session: 20 min  Stress: No Stress Concern Present (04/15/2021)   [place]    Feeling of Stress : Not at all  Social Connections: Moderately Integrated (04/15/2021)   Social Connection and Isolation Panel [NHANES]    Frequency of Communication with Friends and Family: More than three times a week    Frequency of Social Gatherings with Friends and Family: More than three times a week    Attends Religious Services: 1 to 4 times per year    Active Member of [employer] or Organizations: No    Attends Archivist Meetings: 1 to 4 times per year    Marital Status:  Widowed  Intimate Partner Violence: Not At Risk (04/15/2021)   Humiliation, Afraid, Rape, and Kick questionnaire    Fear of Current or Ex-Partner: No    Emotionally Abused: No    Physically Abused: No    Sexually Abused: No    Family History:    Family History  Problem Relation Age of Onset   Clotting disorder Mother    Hypertension Sister    Diabetes Sister      ROS:  Please see the history of present illness.   All other ROS reviewed and negative.     Physical Exam/Data:   Vitals:   11/17/21 0607 11/17/21 0615 11/17/21 0645 11/17/21 0800  BP:  (!) 140/63 (!) 140/63 135/60  Pulse:  60 60 (!) 57  Resp:   18 20  Temp: 98.3 F (36.8 C)  98.3 F (36.8 C)   TempSrc: Oral  Oral   SpO2:  100% 100% 100%  Weight:      Height:        Intake/Output Summary (Last 24 hours) at 11/17/2021 1018 Last data filed at 11/17/2021 0646 Gross per 24 hour  Intake 127.58 ml  Output --  Net 127.58 ml      11/16/2021    7:20 PM 11/16/2021    5:45 PM 11/10/2021    9:56 AM  Last 3 Weights  Weight (lbs) 212 lb 211 lb 3.2 oz 211 lb 3.2 oz  Weight (kg) 96.163 kg 95.8 kg 95.8 kg     Body mass index is 30.42 kg/m.  General: Pleasant male appearing in no acute distress.  HEENT: normal Neck: no JVD Vascular: No carotid bruits; Distal pulses 2+ bilaterally Cardiac:  normal S1, S2; RRR; no murmur Lungs:  clear to auscultation bilaterally, no wheezing, rhonchi or rales  Abd: soft, nontender, no hepatomegaly  Ext: Trace ankle edema bilaterally.  Musculoskeletal:  No deformities, BUE and BLE strength normal and equal Skin: warm and dry  Neuro:  CNs 2-12 intact, no focal abnormalities noted Psych:  Normal affect   EKG:  The EKG was personally reviewed and demonstrates: NSR, HR 69 with inferior and anterior infarct pattern which is similar to prior tracings.   Telemetry:  Telemetry was personally reviewed and demonstrates:  NSR, HR in 50's to 60's with occasional PVC's.   Relevant CV  Studies:  R/LHC: 09/09/2020 Prox RCA to Mid RCA lesion is 100% stenosed.   1st Mrg lesion is 80% stenosed.   2nd Mrg-1 lesion is 70% stenosed.   2nd Mrg-2 lesion is 80% stenosed.   1st Diag lesion is 80% stenosed.   Dist  LM lesion is 30% stenosed.   Ost Cx lesion is 80% stenosed.   2nd Diag lesion is 80% stenosed.   Prox LAD lesion is 75% stenosed.   Mid LAD lesion is 99% stenosed.   1. Normal (low) filling pressures.  2. Preserved cardiac output.  3. Severe 3 vessel disease.  The RCA is occluded with collaterals to the PDA.  The proximal-mid LAD has a long segment of up to 99% stenosis.  The ostial LCx has 80% stenosis.  There is significant disease in the OMs and diagonals.    The patient feels good, he has had no chest pain and on current HF medication regimen, he has minimal dyspnea.  I am going to hydrate him post-cath given CKD 3 and renal transplant.  I will let him go home, may start Eliquis tomorrow morning.  He will need cardiac surgery consult asap (within the next week, will arrange).  He is not a good candidate for PCI, he is a CABG candidate but this will be high risk with low EF (though cardiac output preserved) and CKD.  Would recommend [place] also with CABG.    Echocardiogram: 11/01/2021 IMPRESSIONS     1. Left ventricular ejection fraction, by estimation, is 30 to 35%. The  left ventricle has moderately decreased function. Left ventricular  endocardial border not optimally defined to evaluate regional wall motion.  The left ventricular internal cavity  size was mildly dilated. There is mild left ventricular hypertrophy. Left  ventricular diastolic parameters are consistent with Grade II diastolic  dysfunction (pseudonormalization).   2. Right ventricular systolic function is mildly reduced. The right  ventricular size is mildly enlarged. Tricuspid regurgitation signal is  inadequate for assessing PA pressure.   3. Left atrial size was mildly dilated.   4. The mitral  valve is normal in structure. Mild mitral valve  regurgitation. No evidence of mitral stenosis.   5. The aortic valve is grossly normal. There is mild calcification of the  aortic valve. Aortic valve regurgitation is trivial. No aortic stenosis is  present.   6. The inferior vena cava is normal in size with greater than 50%  respiratory variability, suggesting right atrial pressure of 3 mmHg.    Laboratory Data:  High Sensitivity Troponin:   Recent Labs  Lab 11/16/21 1910 11/16/21 2113 11/17/21 0824  TROPONINIHS 672* 1,331* 5,874*     Chemistry Recent Labs  Lab 11/16/21 1910 11/16/21 2016 11/17/21 0500  NA 135 133* 134*  K 6.4* 6.3* 5.7*  CL 109 109 109  CO2 19*  --  19*  GLUCOSE 160* 145* 141*  BUN 40* 48* 40*  CREATININE 1.96* 2.00* 1.94*  CALCIUM 7.7*  --  7.8*  GFRNONAA 35*  --  35*  ANIONGAP 7  --  6    No results for input(s): "PROT", "ALBUMIN", "AST", "ALT", "ALKPHOS", "BILITOT" in the last 168 hours.  Lipids  Recent Labs  Lab 11/17/21 0500  CHOL 72  TRIG 55  HDL 27*  LDLCALC 34  CHOLHDL 2.7    Hematology Recent Labs  Lab 11/16/21 1910 11/16/21 2016 11/17/21 0500  WBC 5.3  --  3.1*  RBC 3.47*  --  3.09*  HGB 9.9* 9.2* 8.7*  HCT 32.0* 27.0* 29.1*  MCV 92.2  --  94.2  MCH 28.5  --  28.2  MCHC 30.9  --  29.9*  RDW 15.0  --  14.8  PLT 153  --  151   Thyroid No results for input(s): "  TSH", "FREET4" in the last 168 hours.  BNPNo results for input(s): "BNP", "PROBNP" in the last 168 hours.  DDimer No results for input(s): "DDIMER" in the last 168 hours.   Radiology/Studies:  DG Chest 2 View  Result Date: 11/16/2021 CLINICAL DATA:  Chest pain, shortness of breath EXAM: CHEST - 2 VIEW COMPARISON:  10/29/2021 FINDINGS: Mild left lower lobe/retrocardiac opacity, atelectasis versus pneumonia. Mild right basilar opacity, likely atelectasis. No frank interstitial edema. No pleural effusion or pneumothorax. Mild cardiomegaly. IMPRESSION: Mild bilateral  lower lobe opacities, atelectasis versus pneumonia. Mild cardiomegaly.  No frank interstitial edema. Electronically Signed   By: Julian Hy M.D.   On: 11/16/2021 19:47     Assessment and Plan:   1. NSTEMI/CAD - He presented with chest pain and dyspnea which he initially thought was secondary to acid reflux but symptoms persisted. He has been found to have an NSTEMI with troponin values elevated to 1331. As discussed in the H&P and in discussion with the patient and his daughter (who joined by phone today), he is very clear in his decision that he does not wish to undergo further invasive procedures, including a cardiac catheterization. He has been made DNR and prefers medical management of his NSTEMI. - Will plan for 48 hours of Heparin. Will obtain a repeat echocardiogram, mostly for reassessment of RV and LV function. Continue PTA Atorvastatin '40mg'$  daily, Zetia '10mg'$  daily and Toprol-XL '100mg'$  daily. He was not on ASA prior to admission given the use of anticoagulation but will add for now.   2. CAD - Prior catheterization in 09/2020 showed severe 3-vessel CAD and he declined CABG. It was mentioned he might be a candidate for CTO of the LAD but it would be high-risk and medical management was recommended. As discussed above, he does not wish to undergo invasive procedures and medical management is planned for his NSTEMI.  - Continue ASA, Toprol-XL, Atorvastatin and Zetia along with Heparin.   3. HFrEF - His EF was at 25-30% by echo in 05/2020 and had improved to 30-35% by recent echo last month. Will obtain a repeat echocardiogram in the setting of his NSTEMI.  - He does not appear volume overloaded by examination today. Continue Toprol-XL '100mg'$  daily. Spironolactone and Lisinopril currently held given his hyperkalemia. Previously intolerant to [employer]. Can restart [place] and [place] later this admission.   4. Paroxysmal Atrial Fibrillation - He is s/p DCCV in 10/2020. Maintaining NSR this  admission. He has been continued on Amiodarone '100mg'$  daily and Toprol-XL '100mg'$  daily.  - On Eliquis prior to admission which is currently held given the use of Heparin.   5. HLD - FLP this admission shows total cholesterol of 72, triglycerides 55, HDL 27 and LDL 34. Continue Atorvastatin and Zetia.   6. 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 K+ was elevated to 6.4 and he did receive Lokelma with K+ at 5.7 this AM. PTA Lisinopril and Spironolactone currently held.    For questions or updates, please contact [place] Please consult [email or web address] for contact info under    Signed, Candee Furbish, MD  11/17/2021 10:18 AM  Personally seen and examined. Agree with above.  76 year old with known severe coronary artery disease with non-ST elevation myocardial infarction, prior renal transplant, paroxysmal atrial fibrillation seen by the advanced heart failure clinic by Dr. Aundra Dubin.  Had chest discomfort/anginal symptoms yesterday.  Feeling better.  Comfortable in bed currently.  States that albuterol treatment made him breathe better.  Troponin was 672, up to 1300, up to 5800 Creatinine 1.94.  Potassium 6.4.  Improved to 5.7.  EKG looks similar to prior with inferior and inferior infarct pattern.  Normal rhythm.  COVID-negative.  Prior echo EF 25 to 30% .  On exam alert and orient x3 pleasant.  Regular rate and rhythm.  Diffuse wheezing heard throughout lung fields.  Non-STEMI - He request that he undergo medical management at this time.  Noninvasive strategy.  He is feeling better.  He is DNR.  We will repeat echocardiogram to assess structure and function once again.  Continue with goal-directed medical therapy as tolerated.  Atorvastatin 40 mg Zetia, Toprol-XL 100. -Had lengthy discussion.  Understands the possibility of increased mortality and morbidity surrounding non-STEMI. -Heparin IV 48  hours  CAD - Last catheterization in August 2022 showed severe three-vessel CAD.  He had declined CABG.  Goal-directed medical therapy.  HFrEF - EF 25 to 30%.  Repeating echocardiogram given current non-STEMI.  Appears fairly euvolemic.  Continuing with Toprol 100.  Spironolactone and lisinopril have been held given hyperkalemia.  Previously intolerant to [employer].  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  We will likely be able to restart later this admission.  Paroxysmal atrial fibrillation - Maintaining sinus rhythm.  Prior cardioversion in 10/2020.  On amiodarone 100 mg a day as well as Toprol 100. -Holding Eliquis because of heparin.  Resume Eliquis once heparin is off after 48 hours.  Chronic kidney disease stage III/IV. -Hyperkalemia noted.  Lokelma.  Lisinopril spironolactone held.  CRITICAL CARE Performed by: Candee Furbish   Total critical care time: 40 minutes  Critical care time was exclusive of separately billable procedures and treating other patients.  Critical care was necessary to treat or prevent imminent or life-threatening deterioration.  Critical care was time spent personally by me on the following activities: development of treatment plan with patient and/or surrogate as well as nursing, discussions with consultants, evaluation of patient's response to treatment, examination of patient, obtaining history from patient or surrogate, ordering and performing treatments and interventions, ordering and review of laboratory studies, ordering and review of radiographic studies, pulse oximetry and re-evaluation of patient's condition.   Candee Furbish, MD

## 2021-11-17 NOTE — ED Notes (Signed)
Report given and accepting nurse accepted pt. ED RN will transport patient.

## 2021-11-17 NOTE — ED Notes (Signed)
Pt has left unit and was transported to ICU via Development worker, community

## 2021-11-17 NOTE — Progress Notes (Signed)
PROGRESS NOTE     Daniel Myers, is a 76 y.o. male, DOB - 06-24-1945, DPO:[id number]  Admit date - 11/16/2021   Admitting Physician Karmen Bongo, MD  Outpatient Primary MD for the patient is Claretta Fraise, MD  LOS - 1  Chief Complaint  Patient presents with   Chest Pain        Brief Narrative:  76 y.o. male with medical history significant for  CAD (s/p cath in 09/2020 showing severe 3-vessel CAD and patient declined CABG and could possibly intervene on LAD but would be CTO and high-risk, therefore medical management recommended), HFrEF (EF 30-35% in 10/2021), paroxysmal atrial fibrillation (s/p DCCV in 10/2020 and has been continued on Amiodarone), HTN, HLD, Stage 3 CKD and history of CVA, renal transplant admitted on 11/16/2021 with NSTEMI in the setting of chest pain and dyspnea    -Assessment and Plan: 1)NSTEMI/CAD-prior cardiac cath consistent with severe three-vessel CAD-- -s/p cath in 09/2020 showing severe 3-vessel CAD and patient declined CABG and could possibly intervene on LAD but would be CTO and high-risk, therefore medical management recommended) -No further chest pains -Troponin 672>>1,331>> 5,874 patient previously deemed not to be a candidate for PCI, patient previously declined CABG  -Patient and daughter declined transfer to [facility] for possible [facility] and intervention -Cardiology consult appreciated -Continue IV heparin for at least 48 hours -Continue atorvastatin, zetia, metoprolol, aspirin  -Please note that patient was not on aspirin PTA LDL 34, HDL 27, T chol 72----Even if  lipid panel is within desired limits patient should still take Lipitor for it's Pleiotropic effects (beyond cholesterol lowering benefits)  2)PAFib----s/p DCCV in 10/2020  -Echo from 11/17/2021 showed low EF and biatrial enlargement, no aortic stenosis and No mitral stenosis -Continue metoprolol and Amiodarone  3)S/p  prior renal transplant with CKD 4--with recurrent hyperkalemia and  metabolic acidosis -Suspect that Bactrim use is contributory to patient's recurrent hyperkalemia -Continue bicarb -Continue Lokelma---potassium is down to 5.7 from 6.4 -Creatinine is currently close to baseline -Continue tacrolimus, CellCept, prednisone and Bactrim -Renally adjust medications, avoid nephrotoxic agents / dehydration  / hypotension   4) HFrEF--patient with history of combined diastolic and systolic dysfunction CHF -Echo from 11/17/2021 shows EF is down to 20 to 25% with global hypokinesis of the left ventricle and grade 3 diastolic dysfunction -EF was 30 to 35% back in September 2023  5)DM2-A1c 6.2 reflecting excellent diabetic control PTA  6) chronic anemia--- suspect related to CKD Monitor closely and consider Procrit/ESA -No bleeding concerns at this time  7) recent COVID-19 infection and URI----supportive and symptomatic treatment advised  8)Generalized weakness and falls--- history of rib fracture on 10/29/2021 fall at home -Anticipate patient will need home health PT postdischarge  9)Social/Ethics--plan of care discussed with patient and patient's daughter at [facility] -Patient is a DNR/DNI without limitations to treatment  Disposition/Need for in-Hospital Stay- patient unable to be discharged at this time due to -NSTEMI-requiring IV heparin and further observation/monitoring  Status is: Inpatient   Disposition: The patient is from: Home              Anticipated d/c is to: Home              Anticipated d/c date is: > 3 days              Patient currently is not medically stable to d/c. Barriers: Not Clinically Stable-   Code Status :  -  Code Status: DNR   Family Communication:   Discussed  with daughter at [facility]  DVT Prophylaxis  :   - SCDs/iv Heparin    Lab Results  Component Value Date   PLT 151 11/17/2021   Inpatient Medications  Scheduled Meds:  amiodarone  100 mg Oral Daily   aspirin EC  81 mg Oral Daily   atorvastatin  40 mg  Oral Daily   [START ON 11/18/2021] Chlorhexidine Gluconate Cloth  6 each Topical Q0600   ezetimibe  10 mg Oral Daily   fluticasone  1 spray Each Nare Daily   insulin aspart  0-9 Units Subcutaneous TID WC   insulin NPH Human  10 Units Subcutaneous BID AC & HS   metoprolol succinate  100 mg Oral Daily   mycophenolate  500 mg Oral BID   predniSONE  5 mg Oral Q breakfast   sodium bicarbonate  1,300 mg Oral BID   sodium chloride flush  3 mL Intravenous Q12H   sodium zirconium cyclosilicate  10 g Oral BID   [START ON 11/18/2021] sulfamethoxazole-trimethoprim  1 tablet Oral Q M,W,F   tacrolimus  1 mg Oral BID   Continuous Infusions:  sodium chloride     heparin 1,250 Units/hr (11/17/21 1639)   PRN Meds:.sodium chloride, acetaminophen, albuterol, ALPRAZolam, HYDROcodone-acetaminophen, nitroGLYCERIN, ondansetron (ZOFRAN) IV, sodium chloride flush   Anti-infectives (From admission, onward)    Start     Dose/Rate Route Frequency Ordered Stop   11/18/21 1000  sulfamethoxazole-trimethoprim (BACTRIM) 400-80 MG per tablet 1 tablet        1 tablet Oral Every M-W-F 11/16/21 2209         Subjective: Daniel Myers today has no fevers, no emesis,  No chest pain,   No fever  Or chills    Objective: Vitals:   11/17/21 1000 11/17/21 1056 11/17/21 1411 11/17/21 1604  BP: (!) 115/52     Pulse: (!) 50     Resp: (!) 27     Temp:  98.3 F (36.8 C)  98.2 F (36.8 C)  TempSrc:  Oral  Oral  SpO2: 100%     Weight:      Height:   '5\' 10"'$  (1.778 m)     Intake/Output Summary (Last 24 hours) at 11/17/2021 1727 Last data filed at 11/17/2021 0646 Gross per 24 hour  Intake 127.58 ml  Output --  Net 127.58 ml   Filed Weights   11/16/21 1745 11/16/21 1920  Weight: 95.8 kg 96.2 kg    Physical Exam  Gen:- Awake Alert,  in no apparent distress  HEENT:- [place].AT, No sclera icterus Neck-Supple Neck,No JVD,.  Lungs-  CTAB , fair symmetrical air movement CV- S1, S2 normal, irregular Abd-  +ve  B.Sounds, Abd Soft, No tenderness,    Extremity/Skin:- No significant edema, pedal pulses present  Psych-affect is appropriate, oriented x3 Neuro-generalized weakness, no new focal deficits, no tremors  Data Reviewed: I have personally reviewed following labs and imaging studies  CBC: Recent Labs  Lab 11/16/21 1910 11/16/21 2016 11/17/21 0500  WBC 5.3  --  3.1*  HGB 9.9* 9.2* 8.7*  HCT 32.0* 27.0* 29.1*  MCV 92.2  --  94.2  PLT 153  --  956   Basic Metabolic Panel: Recent Labs  Lab 11/16/21 1910 11/16/21 2016 11/17/21 0500  NA 135 133* 134*  K 6.4* 6.3* 5.7*  CL 109 109 109  CO2 19*  --  19*  GLUCOSE 160* 145* 141*  BUN 40* 48* 40*  CREATININE 1.96* 2.00* 1.94*  CALCIUM 7.7*  --  7.8*   GFR: Estimated Creatinine Clearance: 38.3 mL/min (A) (by C-G formula based on SCr of 1.94 mg/dL (H)).  Recent Results (from the past 240 hour(s))  SARS Coronavirus 2 by RT PCR (hospital order, performed in [facility] hospital lab) *cepheid single result test* Anterior Nasal Swab     Status: None   Collection Time: 11/16/21 10:10 PM   Specimen: Anterior Nasal Swab  Result Value Ref Range Status   SARS Coronavirus 2 by RT PCR NEGATIVE NEGATIVE Final    Comment: (NOTE) SARS-CoV-2 target nucleic acids are NOT DETECTED.  The SARS-CoV-2 RNA is generally detectable in upper and lower respiratory specimens during the acute phase of infection. The lowest concentration of SARS-CoV-2 viral copies this assay can detect is 250 copies / mL. A negative result does not preclude SARS-CoV-2 infection and should not be used as the sole basis for treatment or other patient management decisions.  A negative result may occur with improper specimen collection / handling, submission of specimen other than nasopharyngeal swab, presence of viral mutation(s) within the areas targeted by this assay, and inadequate number of viral copies (<250 copies / mL). A negative result must be combined with  clinical observations, patient history, and epidemiological information.  Fact Sheet for Patients:   https://www.patel.info/  Fact Sheet for Healthcare Providers: https://hall.com/  This test is not yet approved or  cleared by the [place] FDA and has been authorized for detection and/or diagnosis of SARS-CoV-2 by FDA under an Emergency Use Authorization (EUA).  This EUA will remain in effect (meaning this test can be used) for the duration of the COVID-19 declaration under Section 564(b)(1) of the Act, 21 U.S.C. section 360bbb-3(b)(1), unless the authorization is terminated or revoked sooner.  Performed at [facility], [address], [place], [place] [address]      Radiology Studies: ECHOCARDIOGRAM COMPLETE  Result Date: 11/17/2021    ECHOCARDIOGRAM REPORT   Patient Name:   Daniel Myers Date of Exam: 11/17/2021 Medical Rec #:  [id number]     Height:       70.0 in Accession #:    [id number]    Weight:       212.0 lb Date of Birth:  September 03, 1945     BSA:          2.140 m Patient Age:    28 years      BP:           103/50 mmHg Patient Gender: M             HR:           56 bpm. Exam Location:  [facility] Procedure: 2D Echo, Color Doppler, Cardiac Doppler and Intracardiac            Opacification Agent Indications:    NSTEMI  History:        Patient has prior history of Echocardiogram examinations, most                 recent 11/01/2021. CAD; Risk Factors:Hypertension, Diabetes and                 Dyslipidemia.  Sonographer:    Raquel Sarna Senior RDCS Referring Phys: [id number] Erma Heritage  Sonographer Comments: Technically difficult due to lung interference. IMPRESSIONS  1. Left ventricular ejection fraction, by estimation, is 20 to 25%. The left ventricle has severely decreased function. The left ventricle demonstrates global hypokinesis. Left ventricular diastolic parameters are consistent with Grade III diastolic dysfunction (  restrictive).  2.  Right ventricular systolic function is mildly reduced. The right ventricular size is normal.  3. Left atrial size was moderately dilated.  4. Right atrial size was moderately dilated.  5. The mitral valve is normal in structure. No evidence of mitral valve regurgitation. No evidence of mitral stenosis.  6. The aortic valve is tricuspid. Aortic valve regurgitation is not visualized. No aortic stenosis is present.  7. The inferior vena cava is normal in size with greater than 50% respiratory variability, suggesting right atrial pressure of 3 mmHg. Comparison(s): Prior images reviewed side by side. Prior EF described as 30-35%. FINDINGS  Left Ventricle: Left ventricular ejection fraction, by estimation, is 20 to 25%. The left ventricle has severely decreased function. The left ventricle demonstrates global hypokinesis. Definity contrast agent was given IV to delineate the left ventricular endocardial borders. The left ventricular internal cavity size was normal in size. There is no left ventricular hypertrophy. Left ventricular diastolic parameters are consistent with Grade III diastolic dysfunction (restrictive).  LV Wall Scoring: The apex is dyskinetic. The mid and distal anterior septum, apical lateral segment, and apical anterior segment are akinetic. Right Ventricle: The right ventricular size is normal. No increase in right ventricular wall thickness. Right ventricular systolic function is mildly reduced. Left Atrium: Left atrial size was moderately dilated. Right Atrium: Right atrial size was moderately dilated. Pericardium: There is no evidence of pericardial effusion. Mitral Valve: The mitral valve is normal in structure. No evidence of mitral valve regurgitation. No evidence of mitral valve stenosis. Tricuspid Valve: The tricuspid valve is normal in structure. Tricuspid valve regurgitation is not demonstrated. No evidence of tricuspid stenosis. Aortic Valve: The aortic valve is tricuspid. Aortic valve  regurgitation is not visualized. No aortic stenosis is present. Pulmonic Valve: The pulmonic valve was normal in structure. Pulmonic valve regurgitation is not visualized. No evidence of pulmonic stenosis. Aorta: The aortic root is normal in size and structure. Venous: The inferior vena cava is normal in size with greater than 50% respiratory variability, suggesting right atrial pressure of 3 mmHg. IAS/Shunts: No atrial level shunt detected by color flow Doppler.  LEFT VENTRICLE PLAX 2D LVIDd:         5.80 cm      Diastology LVIDs:         4.80 cm      LV e' medial:    3.92 cm/s LV PW:         1.20 cm      LV E/e' medial:  20.8 LV IVS:        1.10 cm      LV e' lateral:   5.66 cm/s LVOT diam:     2.20 cm      LV E/e' lateral: 14.4 LV SV:         67 LV SV Index:   31 LVOT Area:     3.80 cm  LV Volumes (MOD) LV vol d, MOD A4C: 201.0 ml LV vol s, MOD A4C: 154.0 ml LV SV MOD A4C:     201.0 ml RIGHT VENTRICLE RV S prime:     10.80 cm/s TAPSE (M-mode): 2.4 cm LEFT ATRIUM              Index        RIGHT ATRIUM           Index LA diam:        5.40 cm  2.52 cm/m   RA Area:     21.00  cm LA Vol (A2C):   118.6 ml 55.43 ml/m  RA Volume:   61.40 ml  28.70 ml/m LA Vol (A4C):   93.2 ml  43.56 ml/m LA Biplane Vol: 94.2 ml  44.03 ml/m  AORTIC VALVE LVOT Vmax:   78.30 cm/s LVOT Vmean:  56.700 cm/s LVOT VTI:    0.176 m  AORTA Ao Root diam: 3.60 cm Ao Asc diam:  3.20 cm MITRAL VALVE MV Area (PHT): 2.70 cm    SHUNTS MV Decel Time: 281 msec    Systemic VTI:  0.18 m MV E velocity: 81.70 cm/s  Systemic Diam: 2.20 cm MV A velocity: 58.80 cm/s MV E/A ratio:  1.39 Candee Furbish MD Electronically signed by Candee Furbish MD Signature Date/Time: 11/17/2021/10:40:25 AM    Final    DG Chest 2 View  Result Date: 11/16/2021 CLINICAL DATA:  Chest pain, shortness of breath EXAM: CHEST - 2 VIEW COMPARISON:  10/29/2021 FINDINGS: Mild left lower lobe/retrocardiac opacity, atelectasis versus pneumonia. Mild right basilar opacity, likely  atelectasis. No frank interstitial edema. No pleural effusion or pneumothorax. Mild cardiomegaly. IMPRESSION: Mild bilateral lower lobe opacities, atelectasis versus pneumonia. Mild cardiomegaly.  No frank interstitial edema. Electronically Signed   By: Julian Hy M.D.   On: 11/16/2021 19:47    Scheduled Meds:  amiodarone  100 mg Oral Daily   aspirin EC  81 mg Oral Daily   atorvastatin  40 mg Oral Daily   [START ON 11/18/2021] Chlorhexidine Gluconate Cloth  6 each Topical Q0600   ezetimibe  10 mg Oral Daily   fluticasone  1 spray Each Nare Daily   insulin aspart  0-9 Units Subcutaneous TID WC   insulin NPH Human  10 Units Subcutaneous BID AC & HS   metoprolol succinate  100 mg Oral Daily   mycophenolate  500 mg Oral BID   predniSONE  5 mg Oral Q breakfast   sodium bicarbonate  1,300 mg Oral BID   sodium chloride flush  3 mL Intravenous Q12H   sodium zirconium cyclosilicate  10 g Oral BID   [START ON 11/18/2021] sulfamethoxazole-trimethoprim  1 tablet Oral Q M,W,F   tacrolimus  1 mg Oral BID   Continuous Infusions:  sodium chloride     heparin 1,250 Units/hr (11/17/21 1639)    LOS: 1 day   Roxan Hockey M.D on 11/17/2021 at 5:27 PM  Go to [email or web address] - for contact info  [facility] - Office  [phone]  If 7PM-7AM, please contact night-coverage [email or web address] 11/17/2021, 5:27 PM

## 2021-11-17 NOTE — Progress Notes (Signed)
BRIEF ANTICOAGULATION CONSULT NOTE - Initial Consult  Pharmacy Consult for IV Heparin Indication: chest pain/ACS  Patient Measurements: Heparin Dosing Weight: 92.7 kg  Labs: aPTT 68 <-- 72 (goal 66-102)  Assessment/Plan Heparin is therapeutic after two consecutive PTT levels. Continue rate at 1250 units/hr for the medical management of NSTEMI x 48 hours Daily PTTs and CBC while on heparin  Lorenso Courier, PharmD Clinical Pharmacist 11/17/2021 3:07 PM

## 2021-11-17 NOTE — TOC Initial Note (Signed)
Transition of Care [facility]) - Initial/Assessment Note    Patient Details  Name: Daniel Myers MRN: [id number] Date of Birth: Jul 25, 1945  Transition of Care [facility]) CM/SW Contact:    Shade Flood, LCSW Phone Number: 11/17/2021, 3:52 PM  Clinical Narrative:                  Pt admitted from home. He has high readmission risk score. TOC met with pt for assessment. Pt resides alone. He has two daughters that live close and are supportive and involved. Pt has a cane and walker for DME. Pt not currently active with [facility] though he has had [facility] in the past. Pt able to get to appointments and obtain medications as needed.  Anticipating pt will return home at dc. TOC will follow and continue to assess for dc planning needs.  Expected Discharge Plan: [place] Barriers to Discharge: Continued Medical Work up   Patient Goals and CMS Choice Patient states their goals for this hospitalization and ongoing recovery are:: go home      Expected Discharge Plan and Services Expected Discharge Plan: [place] In-house Referral: Clinical Social Work                                            Prior Living Arrangements/Services   Lives with:: Self Patient language and need for interpreter reviewed:: Yes Do you feel safe going back to the place where you live?: Yes      Need for Family Participation in Patient Care: No (Comment) Care giver support system in place?: Yes (comment) Current home services: DME Criminal Activity/Legal Involvement Pertinent to Current Situation/Hospitalization: No - Comment as needed  Activities of Daily Living Home Assistive Devices/Equipment: CBG Meter, Dentures (specify type), Eyeglasses, Walker (specify type) ADL Screening (condition at time of admission) Patient's cognitive ability adequate to safely complete daily activities?: Yes Is the patient deaf or have difficulty hearing?: No Does the patient have difficulty  seeing, even when wearing glasses/contacts?: No Does the patient have difficulty concentrating, remembering, or making decisions?: Yes Patient able to express need for assistance with ADLs?: Yes Does the patient have difficulty dressing or bathing?: No Independently performs ADLs?: No Communication: Independent Dressing (OT): Independent Grooming: Independent Feeding: Independent Bathing: Independent Toileting: Independent In/Out Bed: Independent Walks in Home: Independent with device (comment) Does the patient have difficulty walking or climbing stairs?: Yes Weakness of Legs: None Weakness of Arms/Hands: None  Permission Sought/Granted                  Emotional Assessment Appearance:: Appears stated age Attitude/Demeanor/Rapport: Engaged Affect (typically observed): Pleasant Orientation: : Oriented to Self, Oriented to Place, Oriented to  Time, Oriented to Situation Alcohol / Substance Use: Not Applicable Psych Involvement: No (comment)  Admission diagnosis:  NSTEMI (non-ST elevated myocardial infarction) [facility]) [I21.4] Patient Active Problem List   Diagnosis Date Noted   NSTEMI (non-ST elevated myocardial infarction) ([place]) 11/16/2021   Hyperkalemia 11/16/2021   Goals of care, counseling/discussion 11/16/2021   DNR (do not resuscitate) 11/16/2021   Cerebrovascular accident (CVA) ([place]) 12/19/2020   Persistent atrial fibrillation ([place]) 88/64/8472   Systolic and diastolic CHF, acute ([place]) 02/13/2020   Thrombocytopenia ([place]) 02/13/2020   CKD (chronic kidney disease) stage 4, GFR 15-29 ml/min ([place]) 02/12/2020   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   Proteinuria 01/22/2020   Essential  hypertension 11/22/2018   Insulin dependent type 2 diabetes mellitus ([place]) 11/22/2018   Renal transplant recipient 11/22/2018   Mixed hyperlipidemia 11/22/2018   Vision decreased 11/22/2018   Hyponatremia 11/22/2018   Seasonal allergic rhinitis due to pollen 11/22/2018   Diabetic  nephropathy associated with type 2 diabetes mellitus ([place]) 05/04/2016   Uncontrolled type 2 diabetes mellitus with hyperglycemia ([place]) 10/29/2015   Immunosuppressive management encounter following kidney transplant 10/07/2015   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 [place] [address] Phone: [phone] Fax: [phone]     Social Determinants of Health (SDOH) Interventions    Readmission Risk Interventions    11/17/2021    3:51 PM  Readmission Risk Prevention Plan  Transportation Screening Complete  HRI or [place] Complete  Social Work Consult for [place] Planning/Counseling Complete  Palliative Care Screening Not Applicable  Medication Review Press photographer) Complete

## 2021-11-18 DIAGNOSIS — I214 Non-ST elevation (NSTEMI) myocardial infarction: Secondary | ICD-10-CM | POA: Diagnosis not present

## 2021-11-18 DIAGNOSIS — N184 Chronic kidney disease, stage 4 (severe): Secondary | ICD-10-CM | POA: Diagnosis not present

## 2021-11-18 DIAGNOSIS — E875 Hyperkalemia: Secondary | ICD-10-CM | POA: Diagnosis not present

## 2021-11-18 DIAGNOSIS — Z940 Kidney transplant status: Secondary | ICD-10-CM

## 2021-11-18 DIAGNOSIS — I4819 Other persistent atrial fibrillation: Secondary | ICD-10-CM

## 2021-11-18 DIAGNOSIS — Z66 Do not resuscitate: Secondary | ICD-10-CM

## 2021-11-18 LAB — GLUCOSE, CAPILLARY
Glucose-Capillary: 127 mg/dL — ABNORMAL HIGH (ref 70–99)
Glucose-Capillary: 149 mg/dL — ABNORMAL HIGH (ref 70–99)
Glucose-Capillary: 169 mg/dL — ABNORMAL HIGH (ref 70–99)
Glucose-Capillary: 201 mg/dL — ABNORMAL HIGH (ref 70–99)

## 2021-11-18 LAB — CBC
HCT: 29.4 % — ABNORMAL LOW (ref 39.0–52.0)
Hemoglobin: 8.9 g/dL — ABNORMAL LOW (ref 13.0–17.0)
MCH: 28.2 pg (ref 26.0–34.0)
MCHC: 30.3 g/dL (ref 30.0–36.0)
MCV: 93 fL (ref 80.0–100.0)
Platelets: 148 10*3/uL — ABNORMAL LOW (ref 150–400)
RBC: 3.16 MIL/uL — ABNORMAL LOW (ref 4.22–5.81)
RDW: 14.8 % (ref 11.5–15.5)
WBC: 3.4 10*3/uL — ABNORMAL LOW (ref 4.0–10.5)
nRBC: 0 % (ref 0.0–0.2)

## 2021-11-18 LAB — BASIC METABOLIC PANEL
Anion gap: 7 (ref 5–15)
BUN: 42 mg/dL — ABNORMAL HIGH (ref 8–23)
CO2: 21 mmol/L — ABNORMAL LOW (ref 22–32)
Calcium: 8 mg/dL — ABNORMAL LOW (ref 8.9–10.3)
Chloride: 112 mmol/L — ABNORMAL HIGH (ref 98–111)
Creatinine, Ser: 2.18 mg/dL — ABNORMAL HIGH (ref 0.61–1.24)
GFR, Estimated: 31 mL/min — ABNORMAL LOW (ref >60)
Glucose, Bld: 118 mg/dL — ABNORMAL HIGH (ref 70–99)
Potassium: 6.2 mmol/L — ABNORMAL HIGH (ref 3.5–5.1)
Sodium: 140 mmol/L (ref 135–145)

## 2021-11-18 LAB — APTT: aPTT: 99 seconds — ABNORMAL HIGH (ref 24–36)

## 2021-11-18 LAB — BRAIN NATRIURETIC PEPTIDE: B Natriuretic Peptide: 2663 pg/mL — ABNORMAL HIGH (ref 0.0–100.0)

## 2021-11-18 LAB — LIPOPROTEIN A (LPA): Lipoprotein (a): 11.7 nmol/L (ref <75.0)

## 2021-11-18 MED ORDER — SULFAMETHOXAZOLE-TRIMETHOPRIM 800-160 MG PO TABS
0.5000 | ORAL_TABLET | ORAL | Status: DC
  Administered 2021-11-18 – 2021-11-20 (×2): 0.5 via ORAL
  Filled 2021-11-18 (×2): qty 1

## 2021-11-18 MED ORDER — FUROSEMIDE 20 MG PO TABS: 20.0000 mg | ORAL_TABLET | ORAL | Status: DC

## 2021-11-18 MED ORDER — APIXABAN 5 MG PO TABS
5.0000 mg | ORAL_TABLET | Freq: Two times a day (BID) | ORAL | Status: DC
  Administered 2021-11-18 – 2021-11-20 (×5): 5 mg via ORAL
  Filled 2021-11-18 (×5): qty 1

## 2021-11-18 MED ORDER — FUROSEMIDE 10 MG/ML IJ SOLN
80.0000 mg | Freq: Once | INTRAMUSCULAR | Status: AC
  Administered 2021-11-18: 80 mg via INTRAVENOUS
  Filled 2021-11-18: qty 8

## 2021-11-18 MED ORDER — SODIUM ZIRCONIUM CYCLOSILICATE 10 G PO PACK
10.0000 g | PACK | Freq: Three times a day (TID) | ORAL | Status: DC
  Administered 2021-11-18 (×3): 10 g via ORAL
  Filled 2021-11-18 (×4): qty 1

## 2021-11-18 NOTE — Progress Notes (Addendum)
Rounding Note    Patient Name: [facility] Date of Encounter: 11/18/2021  [place] Cardiologist: Rozann Lesches, MD  AHF: Dr. Aundra Dubin  Subjective   Says he did not sleep last night due to being away from home. No recurrent chest pain. He does report some dyspnea and wheezing (used Albuterol nebulizer yesterday with improvement).   Inpatient Medications    Scheduled Meds:  amiodarone  100 mg Oral Daily   aspirin EC  81 mg Oral Daily   atorvastatin  40 mg Oral Daily   Chlorhexidine Gluconate Cloth  6 each Topical Q0600   ezetimibe  10 mg Oral Daily   fluticasone  1 spray Each Nare Daily   insulin aspart  0-9 Units Subcutaneous TID WC   insulin NPH Human  10 Units Subcutaneous BID AC & HS   metoprolol succinate  100 mg Oral Daily   mycophenolate  500 mg Oral BID   predniSONE  5 mg Oral Q breakfast   sodium bicarbonate  1,300 mg Oral BID   sodium chloride flush  3 mL Intravenous Q12H   sodium zirconium cyclosilicate  10 g Oral TID   sulfamethoxazole-trimethoprim  1 tablet Oral Q M,W,F   tacrolimus  1 mg Oral BID   Continuous Infusions:  sodium chloride     heparin 1,250 Units/hr (11/17/21 1639)   PRN Meds: sodium chloride, acetaminophen, albuterol, ALPRAZolam, HYDROcodone-acetaminophen, nitroGLYCERIN, ondansetron (ZOFRAN) IV, sodium chloride flush   Vital Signs    Vitals:   11/18/21 0500 11/18/21 0600 11/18/21 0719 11/18/21 0844  BP: 119/65 (!) 130/42  (!) 127/37  Pulse: (!) 52 (!) 53 (!) 57 60  Resp: (!) 21 (!) 23 15   Temp:   97.7 F (36.5 C)   TempSrc:   Oral   SpO2: 100% 96% 100%   Weight:      Height:        Intake/Output Summary (Last 24 hours) at 11/18/2021 0905 Last data filed at 11/18/2021 0552 Gross per 24 hour  Intake --  Output 800 ml  Net -800 ml      11/16/2021    7:20 PM 11/16/2021    5:45 PM 11/10/2021    9:56 AM  Last 3 Weights  Weight (lbs) 212 lb 211 lb 3.2 oz 211 lb 3.2 oz  Weight (kg) 96.163 kg 95.8 kg 95.8 kg       Telemetry    NSR, HR in 60's to 70's with occasional PAC's and PVC's.  - Personally Reviewed  ECG    No new tracings.   Physical Exam   GEN: Pleasant male appearing in no acute distress.   Neck: No JVD Cardiac: RRR, no murmurs, rubs, or gallops.  Respiratory: No rales or rhonchi. Occasional expiratory wheezing along upper lung fields bilaterally.  GI: Soft, nontender, non-distended  MS: Trace lower extremity edema bilaterally; No deformity. Neuro:  Nonfocal  Psych: Normal affect   Labs    High Sensitivity Troponin:   Recent Labs  Lab 11/16/21 1910 11/16/21 2113 11/17/21 0824  TROPONINIHS 672* 1,331* 5,874*     Chemistry Recent Labs  Lab 11/17/21 0500 11/17/21 1721 11/18/21 0346  NA 134* 133* 140  K 5.7* 5.7* 6.2*  CL 109 106 112*  CO2 19* 21* 21*  GLUCOSE 141* 139* 118*  BUN 40* 42* 42*  CREATININE 1.94* 2.06* 2.18*  CALCIUM 7.8* 7.7* 8.0*  GFRNONAA 35* 33* 31*  ANIONGAP '6 6 7    '$ Lipids  Recent Labs  Lab  11/17/21 0500  CHOL 72  TRIG 55  HDL 27*  LDLCALC 34  CHOLHDL 2.7    Hematology Recent Labs  Lab 11/16/21 1910 11/16/21 2016 11/17/21 0500 11/18/21 0346  WBC 5.3  --  3.1* 3.4*  RBC 3.47*  --  3.09* 3.16*  HGB 9.9* 9.2* 8.7* 8.9*  HCT 32.0* 27.0* 29.1* 29.4*  MCV 92.2  --  94.2 93.0  MCH 28.5  --  28.2 28.2  MCHC 30.9  --  29.9* 30.3  RDW 15.0  --  14.8 14.8  PLT 153  --  151 148*   Thyroid No results for input(s): "TSH", "FREET4" in the last 168 hours.  BNPNo results for input(s): "BNP", "PROBNP" in the last 168 hours.  DDimer No results for input(s): "DDIMER" in the last 168 hours.   Radiology    DG Chest 2 View  Result Date: 11/16/2021 CLINICAL DATA:  Chest pain, shortness of breath EXAM: CHEST - 2 VIEW COMPARISON:  10/29/2021 FINDINGS: Mild left lower lobe/retrocardiac opacity, atelectasis versus pneumonia. Mild right basilar opacity, likely atelectasis. No frank interstitial edema. No pleural effusion or pneumothorax. Mild  cardiomegaly. IMPRESSION: Mild bilateral lower lobe opacities, atelectasis versus pneumonia. Mild cardiomegaly.  No frank interstitial edema. Electronically Signed   By: Julian Hy M.D.   On: 11/16/2021 19:47    Cardiac Studies   Echocardiogram: 11/17/2021 IMPRESSIONS     1. Left ventricular ejection fraction, by estimation, is 20 to 25%. The  left ventricle has severely decreased function. The left ventricle  demonstrates global hypokinesis. Left ventricular diastolic parameters are  consistent with Grade III diastolic  dysfunction (restrictive).   2. Right ventricular systolic function is mildly reduced. The right  ventricular size is normal.   3. Left atrial size was moderately dilated.   4. Right atrial size was moderately dilated.   5. The mitral valve is normal in structure. No evidence of mitral valve  regurgitation. No evidence of mitral stenosis.   6. The aortic valve is tricuspid. Aortic valve regurgitation is not  visualized. No aortic stenosis is present.   7. The inferior vena cava is normal in size with greater than 50%  respiratory variability, suggesting right atrial pressure of 3 mmHg.   Comparison(s): Prior images reviewed side by side. Prior EF described as  30-35%.   Patient Profile     76 y.o. male w/PMH of CAD (s/p cath in 09/2020 showing severe 3-vessel CAD and patient declined CABG and could possibly intervene on LAD but would be CTO and high-risk, therefore medical management recommended), HFrEF (EF 30-35% in 10/2021), paroxysmal atrial fibrillation (s/p DCCV in 10/2020 and has been continued on Amiodarone), HTN, HLD, Stage 3 CKD and history of CVA who is currently admitted for an NSTEMI.   Assessment & Plan    1. NSTEMI - He presented with chest pain and dyspnea, found to have a NSTEMI with Hs Troponin values trending up from 672 to 1331 to 5874. As discussed in the initial consult note, the patient has wished to pursue medical management as compared  to undergoing invasive procedures such as a repeat cardiac catheterization. He has also elected DNR status.  - Will continue Heparin for a total of 48 hours with anticipation of stopping this tomorrow morning. Continue ASA '81mg'$  daily, Atorvastatin '40mg'$  daily, Zetia '10mg'$  daily and Toprol-XL '100mg'$  daily.    2. CAD - Prior catheterization in 09/2020 showed severe 3-vessel CAD and he declined CABG. He has elected for medical management of his  NSTEMI this admission and declined repeat catheterization.  - Continue ASA, Toprol-XL, Atorvastatin, Zetia and Heparin.    3. HFrEF - His EF was at 25-30% by echo in 05/2020 and had improved to 30-35% by recent echo last month. Repeat echo this admission shows his EF has declined to 20-25% with Grade 3 DD and RV function is mildly reduced.  - He remains on Toprol-XL '100mg'$  daily. Will plan to restart Ver[place]vo and [place] prior to discharge. Will reorder PO Lasix '20mg'$  every other day as he was taking at home. His Lisinopril and Spironolactone are currently held due to hyperkalemia as outlined below. Previously did not tolerate Entresto.   4. Paroxysmal Atrial Fibrillation - He is s/p DCCV in 10/2020 and is maintaining NSR this admission. Remains on Amiodarone '100mg'$  daily and Toprol-XL '100mg'$  daily.  - He was on Eliquis '5mg'$  BID prior to admission which is now held since receiving Heparin. Will plan to resume Eliquis at discharge.    5. HLD - FLP this admission shows his LDL is at 34. Continue Atorvastatin '40mg'$  daily and Zetia '10mg'$  daily.    6. 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 Was at 1.96 on admission and trending up to 2.18 today. K+ also elevated to 6.2 today and additional Lokelma has already been ordered by the admitting team. He was on Lisinopril and Spironolactone prior to admission which are currently held.    For questions or updates, please contact [place] Please consult [email or web address] for  contact info under      Signed, Erma Heritage, PA-C  11/18/2021, 9:05 AM

## 2021-11-18 NOTE — Progress Notes (Signed)
PROGRESS NOTE     Daniel Myers, is a 76 y.o. male, DOB - 1945-05-08, VZC:[id number]  Admit date - 11/16/2021   Admitting Physician Daniel Bongo, MD  Outpatient Primary MD for the patient is Daniel Fraise, MD  LOS - 2  Chief Complaint  Patient presents with   Chest Pain       Brief Narrative:  76 y.o. male with medical history significant for  CAD (s/p cath in 09/2020 showing severe 3-vessel CAD and patient declined CABG and could possibly intervene on LAD but would be CTO and high-risk, therefore medical management recommended), HFrEF (EF 30-35% in 10/2021), paroxysmal atrial fibrillation (s/p DCCV in 10/2020 and has been continued on Amiodarone), HTN, HLD, Stage 3 CKD and history of CVA, renal transplant admitted on 11/16/2021 with NSTEMI in the setting of chest pain and dyspnea    -Assessment and Plan: 1)NSTEMI/CAD-prior cardiac cath consistent with severe three-vessel CAD-- -s/p cath in 09/2020 showing severe 3-vessel CAD and patient declined CABG and could possibly intervene on LAD but would be CTO and high-risk, therefore medical management recommended) -No further chest pains -Troponin 672>>1,331>> 5,874 patient previously deemed not to be a candidate for PCI, patient previously declined CABG  -Patient and daughter declined transfer to [facility] for possible LHC and intervention -Cardiology consult appreciated -Continue atorvastatin, zetia, metoprolol, aspirin  -Please note that patient was not on aspirin PTA LDL 34, HDL 27, T chol 72----Even if  lipid panel is within desired limits patient should still take Lipitor for it's Pleiotropic effects (beyond cholesterol lowering benefits) -Per cardiologist okay to stop IV heparin and PTA Eliquis  2)PAFib----s/p DCCV in 10/2020  -Echo from 11/17/2021 showed low EF and biatrial enlargement, no aortic stenosis and No mitral stenosis -Continue metoprolol and Amiodarone  3)S/p  prior renal transplant with CKD 4--with recurrent  hyperkalemia and metabolic acidosis -Suspect that Bactrim use is contributory to patient's recurrent hyperkalemia -Continue bicarb -Continue Lokelma---potassium is down to 5.7 from 6.4 -Creatinine trending up a bit---nephrology consult requested especially given need for diuresis at this time -Continue tacrolimus, CellCept, prednisone and Bactrim -Renally adjust medications, avoid nephrotoxic agents / dehydration  / hypotension   4) HFrEF--patient with history of combined diastolic and systolic dysfunction CHF -Echo from 11/17/2021 shows EF is down to 20 to 25% with global hypokinesis of the left ventricle and grade 3 diastolic dysfunction -EF was 30 to 35% back in September 2023 11/18/21 -Clinically appears to have some CHF distribution at this time -BNP trending up, auscultative Lee patient sounds crackly -Per cardiologist okay to give IV diuretics  5)DM2-A1c 6.2 reflecting excellent diabetic control PTA  6) chronic anemia--- suspect related to CKD Monitor closely and consider Procrit/ESA -No bleeding concerns at this time  7) recent COVID-19 infection and URI----supportive and symptomatic treatment advised  8)Generalized weakness and falls--- history of rib fracture on 10/29/2021 fall at home -Anticipate patient will need home health PT postdischarge  9)Social/Ethics--plan of care discussed with patient and patient's daughter at [facility] -Patient is a DNR/DNI without limitations to treatment  Disposition/Need for in-Hospital Stay- patient unable to be discharged at this time due to -NSTEMI-now with CHF exacerbation requiring IV diuretics with close monitoring of renal parameters  Status is: Inpatient   Disposition: The patient is from: Home              Anticipated d/c is to: Home              Anticipated d/c date is: > 3 days  Patient currently is not medically stable to d/c. Barriers: Not Clinically Stable-   Code Status :  -  Code Status: DNR   Family  Communication:   Discussed with daughter at [facility]  DVT Prophylaxis  :   - SCDs/iv Heparin  apixaban (ELIQUIS) tablet 5 mg   Lab Results  Component Value Date   PLT 148 (L) 11/18/2021   Inpatient Medications  Scheduled Meds:  amiodarone  100 mg Oral Daily   apixaban  5 mg Oral BID   aspirin EC  81 mg Oral Daily   atorvastatin  40 mg Oral Daily   Chlorhexidine Gluconate Cloth  6 each Topical Q0600   ezetimibe  10 mg Oral Daily   fluticasone  1 spray Each Nare Daily   insulin aspart  0-9 Units Subcutaneous TID WC   insulin NPH Human  10 Units Subcutaneous BID AC & HS   metoprolol succinate  100 mg Oral Daily   mycophenolate  500 mg Oral BID   predniSONE  5 mg Oral Q breakfast   sodium bicarbonate  1,300 mg Oral BID   sodium chloride flush  3 mL Intravenous Q12H   sodium zirconium cyclosilicate  10 g Oral TID   sulfamethoxazole-trimethoprim  0.5 tablet Oral Q M,W,F   tacrolimus  1 mg Oral BID   Continuous Infusions:  sodium chloride     PRN Meds:.sodium chloride, acetaminophen, albuterol, ALPRAZolam, HYDROcodone-acetaminophen, nitroGLYCERIN, ondansetron (ZOFRAN) IV, sodium chloride flush   Anti-infectives (From admission, onward)    Start     Dose/Rate Route Frequency Ordered Stop   11/18/21 1115  sulfamethoxazole-trimethoprim (BACTRIM DS) 800-160 MG per tablet 0.5 tablet        0.5 tablet Oral Every M-W-F 11/18/21 1025     11/18/21 1000  sulfamethoxazole-trimethoprim (BACTRIM) 400-80 MG per tablet 1 tablet  Status:  Discontinued        1 tablet Oral Every M-W-F 11/16/21 2209 11/18/21 1025       Subjective: Daniel Myers today has no fevers, no emesis,  No further chest pain,   No fever  Or chills  -Patient with dyspnea -No productive cough  Objective: Vitals:   11/18/21 1606 11/18/21 1700 11/18/21 1800 11/18/21 1900  BP:  (!) 124/49 118/63 (!) 133/48  Pulse:  (!) 59 (!) 59 (!) 59  Resp:      Temp: 98 F (36.7 C)     TempSrc: Oral     SpO2:  96% 94% 95%   Weight:      Height:        Intake/Output Summary (Last 24 hours) at 11/18/2021 1921 Last data filed at 11/18/2021 1821 Gross per 24 hour  Intake 300 ml  Output 4750 ml  Net -4450 ml   Filed Weights   11/16/21 1745 11/16/21 1920  Weight: 95.8 kg 96.2 kg    Physical Exam  Gen:- Awake Alert,  in no apparent distress  HEENT:- .AT, No sclera icterus Neck-Supple Neck,No JVD,.  Lungs-scattered rales, diminished breath sounds  CV- S1, S2 normal, irregular Abd-  +ve B.Sounds, Abd Soft, No tenderness,    Extremity/Skin:- + edema, pedal pulses present  Psych-affect is appropriate, oriented x3 Neuro-generalized weakness, no new focal deficits, no tremors  Data Reviewed: I have personally reviewed following labs and imaging studies  CBC: Recent Labs  Lab 11/16/21 1910 11/16/21 2016 11/17/21 0500 11/18/21 0346  WBC 5.3  --  3.1* 3.4*  HGB 9.9* 9.2* 8.7* 8.9*  HCT 32.0* 27.0* 29.1* 29.4*  MCV 92.2  --  94.2 93.0  PLT 153  --  151 224*   Basic Metabolic Panel: Recent Labs  Lab 11/16/21 1910 11/16/21 2016 11/17/21 0500 11/17/21 1721 11/18/21 0346  NA 135 133* 134* 133* 140  K 6.4* 6.3* 5.7* 5.7* 6.2*  CL 109 109 109 106 112*  CO2 19*  --  19* 21* 21*  GLUCOSE 160* 145* 141* 139* 118*  BUN 40* 48* 40* 42* 42*  CREATININE 1.96* 2.00* 1.94* 2.06* 2.18*  CALCIUM 7.7*  --  7.8* 7.7* 8.0*   GFR: Estimated Creatinine Clearance: 34.1 mL/min (A) (by C-G formula based on SCr of 2.18 mg/dL (H)).  Recent Results (from the past 240 hour(s))  SARS Coronavirus 2 by RT PCR (hospital order, performed in [facility] hospital lab) *cepheid single result test* Anterior Nasal Swab     Status: None   Collection Time: 11/16/21 10:10 PM   Specimen: Anterior Nasal Swab  Result Value Ref Range Status   SARS Coronavirus 2 by RT PCR NEGATIVE NEGATIVE Final    Comment: (NOTE) SARS-CoV-2 target nucleic acids are NOT DETECTED.  The SARS-CoV-2 RNA is generally detectable in upper and  lower respiratory specimens during the acute phase of infection. The lowest concentration of SARS-CoV-2 viral copies this assay can detect is 250 copies / mL. A negative result does not preclude SARS-CoV-2 infection and should not be used as the sole basis for treatment or other patient management decisions.  A negative result may occur with improper specimen collection / handling, submission of specimen other than nasopharyngeal swab, presence of viral mutation(s) within the areas targeted by this assay, and inadequate number of viral copies (<250 copies / mL). A negative result must be combined with clinical observations, patient history, and epidemiological information.  Fact Sheet for Patients:   https://www.patel.info/  Fact Sheet for Healthcare Providers: https://hall.com/  This test is not yet approved or  cleared by the [place] FDA and has been authorized for detection and/or diagnosis of SARS-CoV-2 by FDA under an Emergency Use Authorization (EUA).  This EUA will remain in effect (meaning this test can be used) for the duration of the COVID-19 declaration under Section 564(b)(1) of the Act, 21 U.S.C. section 360bbb-3(b)(1), unless the authorization is terminated or revoked sooner.  Performed at [facility], [address], [place], [place] [address]      Radiology Studies: ECHOCARDIOGRAM COMPLETE  Result Date: 11/17/2021    ECHOCARDIOGRAM REPORT   Patient Name:   ARLAND USERY Date of Exam: 11/17/2021 Medical Rec #:  [id number]     Height:       70.0 in Accession #:    [id number]    Weight:       212.0 lb Date of Birth:  1945/05/13     BSA:          2.140 m Patient Age:    51 years      BP:           103/50 mmHg Patient Gender: M             HR:           56 bpm. Exam Location:  [facility] Procedure: 2D Echo, Color Doppler, Cardiac Doppler and Intracardiac            Opacification Agent Indications:    NSTEMI  History:         Patient has prior history of Echocardiogram examinations, most  recent 11/01/2021. CAD; Risk Factors:Hypertension, Diabetes and                 Dyslipidemia.  Sonographer:    Raquel Sarna Senior RDCS Referring Phys: [id number] Erma Heritage  Sonographer Comments: Technically difficult due to lung interference. IMPRESSIONS  1. Left ventricular ejection fraction, by estimation, is 20 to 25%. The left ventricle has severely decreased function. The left ventricle demonstrates global hypokinesis. Left ventricular diastolic parameters are consistent with Grade III diastolic dysfunction (restrictive).  2. Right ventricular systolic function is mildly reduced. The right ventricular size is normal.  3. Left atrial size was moderately dilated.  4. Right atrial size was moderately dilated.  5. The mitral valve is normal in structure. No evidence of mitral valve regurgitation. No evidence of mitral stenosis.  6. The aortic valve is tricuspid. Aortic valve regurgitation is not visualized. No aortic stenosis is present.  7. The inferior vena cava is normal in size with greater than 50% respiratory variability, suggesting right atrial pressure of 3 mmHg. Comparison(s): Prior images reviewed side by side. Prior EF described as 30-35%. FINDINGS  Left Ventricle: Left ventricular ejection fraction, by estimation, is 20 to 25%. The left ventricle has severely decreased function. The left ventricle demonstrates global hypokinesis. Definity contrast agent was given IV to delineate the left ventricular endocardial borders. The left ventricular internal cavity size was normal in size. There is no left ventricular hypertrophy. Left ventricular diastolic parameters are consistent with Grade III diastolic dysfunction (restrictive).  LV Wall Scoring: The apex is dyskinetic. The mid and distal anterior septum, apical lateral segment, and apical anterior segment are akinetic. Right Ventricle: The right ventricular size is normal. No  increase in right ventricular wall thickness. Right ventricular systolic function is mildly reduced. Left Atrium: Left atrial size was moderately dilated. Right Atrium: Right atrial size was moderately dilated. Pericardium: There is no evidence of pericardial effusion. Mitral Valve: The mitral valve is normal in structure. No evidence of mitral valve regurgitation. No evidence of mitral valve stenosis. Tricuspid Valve: The tricuspid valve is normal in structure. Tricuspid valve regurgitation is not demonstrated. No evidence of tricuspid stenosis. Aortic Valve: The aortic valve is tricuspid. Aortic valve regurgitation is not visualized. No aortic stenosis is present. Pulmonic Valve: The pulmonic valve was normal in structure. Pulmonic valve regurgitation is not visualized. No evidence of pulmonic stenosis. Aorta: The aortic root is normal in size and structure. Venous: The inferior vena cava is normal in size with greater than 50% respiratory variability, suggesting right atrial pressure of 3 mmHg. IAS/Shunts: No atrial level shunt detected by color flow Doppler.  LEFT VENTRICLE PLAX 2D LVIDd:         5.80 cm      Diastology LVIDs:         4.80 cm      LV e' medial:    3.92 cm/s LV PW:         1.20 cm      LV E/e' medial:  20.8 LV IVS:        1.10 cm      LV e' lateral:   5.66 cm/s LVOT diam:     2.20 cm      LV E/e' lateral: 14.4 LV SV:         67 LV SV Index:   31 LVOT Area:     3.80 cm  LV Volumes (MOD) LV vol d, MOD A4C: 201.0 ml LV vol s, MOD A4C: 154.0 ml LV  SV MOD A4C:     201.0 ml RIGHT VENTRICLE RV S prime:     10.80 cm/s TAPSE (M-mode): 2.4 cm LEFT ATRIUM              Index        RIGHT ATRIUM           Index LA diam:        5.40 cm  2.52 cm/m   RA Area:     21.00 cm LA Vol (A2C):   118.6 ml 55.43 ml/m  RA Volume:   61.40 ml  28.70 ml/m LA Vol (A4C):   93.2 ml  43.56 ml/m LA Biplane Vol: 94.2 ml  44.03 ml/m  AORTIC VALVE LVOT Vmax:   78.30 cm/s LVOT Vmean:  56.700 cm/s LVOT VTI:    0.176 m  AORTA Ao  Root diam: 3.60 cm Ao Asc diam:  3.20 cm MITRAL VALVE MV Area (PHT): 2.70 cm    SHUNTS MV Decel Time: 281 msec    Systemic VTI:  0.18 m MV E velocity: 81.70 cm/s  Systemic Diam: 2.20 cm MV A velocity: 58.80 cm/s MV E/A ratio:  1.39 Candee Furbish MD Electronically signed by Candee Furbish MD Signature Date/Time: 11/17/2021/10:40:25 AM    Final    DG Chest 2 View  Result Date: 11/16/2021 CLINICAL DATA:  Chest pain, shortness of breath EXAM: CHEST - 2 VIEW COMPARISON:  10/29/2021 FINDINGS: Mild left lower lobe/retrocardiac opacity, atelectasis versus pneumonia. Mild right basilar opacity, likely atelectasis. No frank interstitial edema. No pleural effusion or pneumothorax. Mild cardiomegaly. IMPRESSION: Mild bilateral lower lobe opacities, atelectasis versus pneumonia. Mild cardiomegaly.  No frank interstitial edema. Electronically Signed   By: Julian Hy M.D.   On: 11/16/2021 19:47    Scheduled Meds:  amiodarone  100 mg Oral Daily   apixaban  5 mg Oral BID   aspirin EC  81 mg Oral Daily   atorvastatin  40 mg Oral Daily   Chlorhexidine Gluconate Cloth  6 each Topical Q0600   ezetimibe  10 mg Oral Daily   fluticasone  1 spray Each Nare Daily   insulin aspart  0-9 Units Subcutaneous TID WC   insulin NPH Human  10 Units Subcutaneous BID AC & HS   metoprolol succinate  100 mg Oral Daily   mycophenolate  500 mg Oral BID   predniSONE  5 mg Oral Q breakfast   sodium bicarbonate  1,300 mg Oral BID   sodium chloride flush  3 mL Intravenous Q12H   sodium zirconium cyclosilicate  10 g Oral TID   sulfamethoxazole-trimethoprim  0.5 tablet Oral Q M,W,F   tacrolimus  1 mg Oral BID   Continuous Infusions:  sodium chloride      LOS: 2 days   Roxan Hockey M.D on 11/18/2021 at 7:21 PM  Go to [email or web address] - for contact info  [facility] - Office  [phone]  If 7PM-7AM, please contact night-coverage [email or web address] 11/18/2021, 7:21 PM

## 2021-11-18 NOTE — Progress Notes (Signed)
ANTICOAGULATION CONSULT NOTE - Initial Consult  Pharmacy Consult for IV Heparin Indication: chest pain/ACS  Patient Measurements: Height: '5\' 10"'$  (177.8 cm) Weight: 96.2 kg (212 lb) IBW/kg (Calculated) : 73 Heparin Dosing Weight: 92.7 kg  Labs: Recent Labs    11/16/21 1910 11/16/21 1910 11/16/21 1940 11/16/21 2016 11/16/21 2113 11/17/21 0500 11/17/21 0824 11/17/21 1346 11/17/21 1721 11/18/21 0346  HGB 9.9*  --   --  9.2*  --  8.7*  --   --   --  8.9*  HCT 32.0*  --   --  27.0*  --  29.1*  --   --   --  29.4*  PLT 153  --   --   --   --  151  --   --   --  148*  APTT  --    < > 29  --   --  76*  --  69*  --  99*  LABPROT  --   --  17.6*  --   --   --   --   --   --   --   INR  --   --  1.5*  --   --   --   --   --   --   --   HEPARINUNFRC  --   --   --   --   --  >1.10*  --   --   --   --   CREATININE 1.96*  --   --  2.00*  --  1.94*  --   --  2.06*  --   TROPONINIHS 672*  --   --   --  1,331*  --  5,874*  --   --   --    < > = values in this interval not displayed.   Estimated Creatinine Clearance: 36.1 mL/min (A) (by C-G formula based on SCr of 2.06 mg/dL (H)).  Assessment: 76 years of age male hx Afib on apixaban PTA, plan 48 hours of heparin for NSTEMI medical management. Heparin remains therapeutic, CBC stable, no adverse events noted. Currently at 36 hours of heparin. Continue to monitor PTTs only given short duration  Goal of Therapy:  aPTT 66-102 seconds Monitor platelets by anticoagulation protocol: Yes   Plan:  Continue Heparin gtt at 1250 units/hr May DC heparin in 12 hours F/u restart of apixaban Daily CBC.   Lorenso Courier, PharmD Clinical Pharmacist 11/18/2021 7:48 AM

## 2021-11-19 ENCOUNTER — Inpatient Hospital Stay (HOSPITAL_COMMUNITY): Payer: Medicare Other

## 2021-11-19 DIAGNOSIS — I5043 Acute on chronic combined systolic (congestive) and diastolic (congestive) heart failure: Secondary | ICD-10-CM

## 2021-11-19 DIAGNOSIS — Z515 Encounter for palliative care: Secondary | ICD-10-CM

## 2021-11-19 DIAGNOSIS — Z7189 Other specified counseling: Secondary | ICD-10-CM | POA: Diagnosis not present

## 2021-11-19 DIAGNOSIS — I214 Non-ST elevation (NSTEMI) myocardial infarction: Secondary | ICD-10-CM | POA: Diagnosis not present

## 2021-11-19 DIAGNOSIS — N184 Chronic kidney disease, stage 4 (severe): Secondary | ICD-10-CM | POA: Diagnosis not present

## 2021-11-19 LAB — URINALYSIS, ROUTINE W REFLEX MICROSCOPIC
Bilirubin Urine: NEGATIVE
Glucose, UA: >=500 mg/dL — AB
Ketones, ur: NEGATIVE mg/dL
Nitrite: NEGATIVE
Protein, ur: 100 mg/dL — AB
Specific Gravity, Urine: 1.01 (ref 1.005–1.030)
WBC, UA: >50 WBC/hpf — ABNORMAL HIGH (ref 0–5)
pH: 6 (ref 5.0–8.0)

## 2021-11-19 LAB — CBC
HCT: 27.1 % — ABNORMAL LOW (ref 39.0–52.0)
Hemoglobin: 8.4 g/dL — ABNORMAL LOW (ref 13.0–17.0)
MCH: 28.6 pg (ref 26.0–34.0)
MCHC: 31 g/dL (ref 30.0–36.0)
MCV: 92.2 fL (ref 80.0–100.0)
Platelets: 131 10*3/uL — ABNORMAL LOW (ref 150–400)
RBC: 2.94 MIL/uL — ABNORMAL LOW (ref 4.22–5.81)
RDW: 14.8 % (ref 11.5–15.5)
WBC: 5.5 10*3/uL (ref 4.0–10.5)
nRBC: 0 % (ref 0.0–0.2)

## 2021-11-19 LAB — COMPREHENSIVE METABOLIC PANEL
ALT: 13 U/L (ref 0–44)
AST: 18 U/L (ref 15–41)
Albumin: 2.7 g/dL — ABNORMAL LOW (ref 3.5–5.0)
Alkaline Phosphatase: 116 U/L (ref 38–126)
Anion gap: 5 (ref 5–15)
BUN: 40 mg/dL — ABNORMAL HIGH (ref 8–23)
CO2: 23 mmol/L (ref 22–32)
Calcium: 7.7 mg/dL — ABNORMAL LOW (ref 8.9–10.3)
Chloride: 107 mmol/L (ref 98–111)
Creatinine, Ser: 2.18 mg/dL — ABNORMAL HIGH (ref 0.61–1.24)
GFR, Estimated: 31 mL/min — ABNORMAL LOW (ref >60)
Glucose, Bld: 123 mg/dL — ABNORMAL HIGH (ref 70–99)
Potassium: 4.6 mmol/L (ref 3.5–5.1)
Sodium: 135 mmol/L (ref 135–145)
Total Bilirubin: 0.8 mg/dL (ref 0.3–1.2)
Total Protein: 5.1 g/dL — ABNORMAL LOW (ref 6.5–8.1)

## 2021-11-19 LAB — GLUCOSE, CAPILLARY
Glucose-Capillary: 106 mg/dL — ABNORMAL HIGH (ref 70–99)
Glucose-Capillary: 193 mg/dL — ABNORMAL HIGH (ref 70–99)
Glucose-Capillary: 219 mg/dL — ABNORMAL HIGH (ref 70–99)
Glucose-Capillary: 224 mg/dL — ABNORMAL HIGH (ref 70–99)

## 2021-11-19 MED ORDER — FUROSEMIDE 10 MG/ML IJ SOLN
40.0000 mg | Freq: Once | INTRAMUSCULAR | Status: AC
  Administered 2021-11-19: 40 mg via INTRAVENOUS
  Filled 2021-11-19: qty 4

## 2021-11-19 NOTE — Consult Note (Signed)
Consultation Note Date: 11/19/2021   Patient Name: Daniel Myers  DOB: 05-Aug-1945  MRN: [id number]  Age / Sex: 76 y.o., male  PCP: Claretta Fraise, MD Referring Physician: Deatra James, MD  Reason for Consultation: Establishing goals of care  HPI/Patient Profile: 76 y.o. 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 Daughter had COVID last week and he likely had infection as well with URI symptoms. EF 20-25% now and concern for cardiorenal syndrome.   Clinical Assessment and Goals of Care: I met today with Daniel Myers and daughter, Daniel Myers, at bedside. I had previously reviewed records, notes, diagnostics, and medical history. I discussed with Daniel Myers his diagnosis with chronic and worsening heart failure and renal failure. We discussed the difficult balance to manage chronic multiorgan failure. Daniel Myers shares that he is unfortunately aware of the difficulty of this balance to keep his heart and his kidneys happy. They both understand that the area to maintain him in good health is becoming a more narrow window and a moving target. He already watches his diet, fluids, and manages his weights and medications carefully. He is hopeful that he can be managed medically to have good time at home and with his family ([place] lives just across the street).   He is very open and aware of his overall poor prognosis. He speaks of his faith and trust that God will take care of him. He is not afraid to die. He is not interested in suffering but wants to maintain his quality of life as long as possible. Daniel Myers confirms goals for quality and comfort. They are hopeful for return home tomorrow and are open to outpatient palliative to follow and support in his home.   All questions/concerns  addressed. Emotional support provided.   Primary Decision Maker PATIENT    SUMMARY OF RECOMMENDATIONS   - DNR - Medical management desired - Quality and comfort are priority of care  Code Status/Advance Care Planning: DNR   Symptom Management:  Per attending, cardiology, nephrology.   Prognosis:  Overall prognosis poor with worsening heart failure and renal failure.   Discharge Planning: Home with Palliative Services      Primary Diagnoses: Present on Admission:  NSTEMI (non-ST elevated myocardial infarction) ([place])  Persistent atrial fibrillation (HCC)  Mixed hyperlipidemia  Essential hypertension  CKD (chronic kidney disease) stage 4, GFR 15-29 ml/min (HCC)  Hyperkalemia  DNR (do not resuscitate)  CAD (coronary artery disease)/Prior MI  Obesity (BMI 30-39.9)   I have reviewed the medical record, interviewed the patient and family, and examined the patient. The following aspects are pertinent.  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   nodular on left jawline - excision   Basal cell carcinoma 07/01/2014   nodular on left hawling - CX3+5FU+excision   Basal cell carcinoma 10/10/2014   nodular on right forearm, middle - tx  p bx   Basal cell carcinoma 02/11/2015   left neck - CX3 + excision   Basal cell carcinoma 06/14/2016   left jawline - CX3+5FU   Basal cell carcinoma 02/22/2017   superficial and nodular on left neck - excision   Basal cell carcinoma 04/11/2018   superficial and nodular on right inferior forearm - CX3+Cautery+5FU   Chronic kidney disease    History of renal transplant    Hyperlipidemia    Hypertension    NSTEMI (non-ST elevated myocardial infarction) ([place]) 02/13/2020   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   Left [place] (well diff)   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   Right Breast (in situ)   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   Left Forearm - anterior  (mod diff)   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   Neck - anterior (mod diff)   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   in situ on left arm - CX3+5FU   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   hypertrophic on left ear - CX3+5FU   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   in situ on right temple - CX3+5FU   Squamous cell carcinoma of skin 11/08/2011   right upper forearm - tx p bx   Squamous cell carcinoma of skin 11/08/2011   left upper forearm - tx p bx   Squamous cell carcinoma of skin 11/08/2011   left hand - tx p bx   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/27/2013   in situ on left lower back - CX3+5FU   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/27/2013   in situ on left forehead - CX3+5FU   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/27/2013   in situ on right temple - watch per ST   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/27/2013   in situ on right forearm - CX3+5FU   Squamous cell carcinoma of skin 07/01/2014   well differentiated on right sideburn - CX3+5FU   Squamous cell carcinoma of skin 07/01/2014   in situ on left shoulder - CX3+5FU   Squamous cell carcinoma of skin 07/01/2014   in situ on right forearm, proximal - CX3+5FU+Cautery   Squamous cell carcinoma of skin 07/01/2014   in situ on right forearm, distal - CX3+5FU   Squamous cell carcinoma of skin 09/30/2015   in situ on posterior left ear - CX3+5FU   Squamous cell carcinoma of skin 02/22/2017   in situ on right upper arm - CX3+5FU   Squamous cell carcinoma of skin 02/22/2017   in situ on left upper arm - CX3+5FU   Squamous cell carcinoma of skin 04/11/2018   in situ on right temple - CX3+5FU   Squamous cell carcinoma of skin 04/11/2018   in situ on lateral right arm - MOHs   Squamous cell carcinoma of skin 04/11/2018   in situ on right upper arm - MOHs   Squamous cell carcinoma of skin 04/11/2018   in situ on left sideburn   Squamous cell carcinoma of skin 04/11/2018   in situ on right flank - tx p bx    Squamous cell carcinoma of skin 09/28/2018   in situ on right inner ear - tx p bx   Squamous cell carcinoma of skin 09/28/2018   in situ on left inner ear - tx p bx   Squamous cell carcinoma of skin 09/28/2018   in situ on right arm - tx p bx  Squamous cell carcinoma of skin 03/21/2019   in situ on right antihelix ([place]kov treated topically)   Squamous cell carcinoma of skin 03/21/2019   in situ on right neck - CX3+5FU   Squamous cell carcinoma of skin 03/21/2019   in situ on left outer eye, inf (MOHs done 05/23/2019)   Type 2 diabetes mellitus ([place])    Unspecified atrial fibrillation ([place]) 02/17/2020   Social History   Socioeconomic History   Marital status: Widowed    Spouse name: Not on file   Number of children: 3   Years of education: Not on file   Highest education level: Not on file  Occupational History   Not on file  Tobacco Use   Smoking status: Some Days    Types: Pipe   Smokeless tobacco: Never  Vaping Use   Vaping Use: Never used  Substance and Sexual Activity   Alcohol use: Never   Drug use: Never   Sexual activity: Not Currently  Other Topics Concern   Not on file  Social History Narrative   Wife passed away in 2007-05-05.   2 daughters, 1 son. All live close by.    3 grandchildren.    Social Determinants of Health   Financial Resource Strain: Low Risk  (04/15/2021)   Overall Financial Resource Strain (CARDIA)    Difficulty of Paying Living Expenses: Not hard at all  Food Insecurity: No Food Insecurity (04/15/2021)   Hunger Vital Sign    Worried About Running Out of Food in the Last Year: Never true    Ran Out of Food in the Last Year: Never true  Transportation Needs: No Transportation Needs (04/15/2021)   PRAPARE - Hydrologist (Medical): No    Lack of Transportation (Non-Medical): No  Physical Activity: Insufficiently Active (04/15/2021)   Exercise Vital Sign    Days of Exercise per Week: 7 days    Minutes of Exercise per  Session: 20 min  Stress: No Stress Concern Present (04/15/2021)   [place]    Feeling of Stress : Not at all  Social Connections: Moderately Integrated (04/15/2021)   Social Connection and Isolation Panel [NHANES]    Frequency of Communication with Friends and Family: More than three times a week    Frequency of Social Gatherings with Friends and Family: More than three times a week    Attends Religious Services: 1 to 4 times per year    Active Member of [employer] or Organizations: No    Attends Archivist Meetings: 1 to 4 times per year    Marital Status: Widowed   Family History  Problem Relation Age of Onset   Clotting disorder Mother    Hypertension Sister    Diabetes Sister    Scheduled Meds:  amiodarone  100 mg Oral Daily   apixaban  5 mg Oral BID   aspirin EC  81 mg Oral Daily   atorvastatin  40 mg Oral Daily   Chlorhexidine Gluconate Cloth  6 each Topical Q0600   ezetimibe  10 mg Oral Daily   fluticasone  1 spray Each Nare Daily   insulin aspart  0-9 Units Subcutaneous TID WC   insulin NPH Human  10 Units Subcutaneous BID AC & HS   metoprolol succinate  100 mg Oral Daily   mycophenolate  500 mg Oral BID   predniSONE  5 mg Oral Q breakfast   sodium bicarbonate  1,300 mg  Oral BID   sodium chloride flush  3 mL Intravenous Q12H   sodium zirconium cyclosilicate  10 g Oral TID   sulfamethoxazole-trimethoprim  0.5 tablet Oral Q M,W,F   tacrolimus  1 mg Oral BID   Continuous Infusions:  sodium chloride     PRN Meds:.sodium chloride, acetaminophen, albuterol, ALPRAZolam, HYDROcodone-acetaminophen, nitroGLYCERIN, ondansetron (ZOFRAN) IV, sodium chloride flush Allergies  Allergen Reactions   Tape Rash    Use paper tape.    Trazodone And Nefazodone Palpitations    tachycardia   Mirtazapine     imbalance   Elemental Sulfur Rash   Sulfa Antibiotics Rash   Review of Systems  Constitutional:         Feeling close to baseline  Respiratory:  Negative for shortness of breath.     Physical Exam Vitals and nursing note reviewed.  Constitutional:      General: He is not in acute distress.    Appearance: He is ill-appearing.  Cardiovascular:     Rate and Rhythm: Bradycardia present.  Pulmonary:     Effort: No tachypnea, accessory muscle usage or respiratory distress.  Neurological:     Mental Status: He is alert and oriented to person, place, and time.     Vital Signs: BP 135/60   Pulse (!) 52   Temp 98.7 F (37.1 C)   Resp 20   Ht '5\' 10"'  (1.778 m)   Wt 96.3 kg   SpO2 99%   BMI 30.46 kg/m  Pain Scale: 0-10   Pain Score: Asleep   SpO2: SpO2: 99 % O2 Device:SpO2: 99 % O2 Flow Rate: .O2 Flow Rate (L/min): 1 L/min  IO: Intake/output summary:  Intake/Output Summary (Last 24 hours) at 11/19/2021 9458 Last data filed at 11/18/2021 2000 Gross per 24 hour  Intake 300 ml  Output 4400 ml  Net -4100 ml    LBM: Last BM Date : 11/18/21 Baseline Weight: Weight: 95.8 kg Most recent weight: Weight: 96.3 kg     Palliative Assessment/Data:     Time In: 1200  Time Total: 55 min  Greater than 50%  of this time was spent counseling and coordinating care related to the above assessment and plan.  Signed by: Vinie Sill, NP Palliative Medicine Team Pager # [phone] (M-F 8a-5p) Team Phone # [phone] (Nights/Weekends)

## 2021-11-19 NOTE — Progress Notes (Signed)
PROGRESS NOTE     Daniel Myers, is a 76 y.o. male, DOB - 04-Apr-1945, XBD:[id number]  Admit date - 11/16/2021   Admitting Physician Karmen Bongo, MD  Outpatient Primary MD for the patient is Claretta Fraise, MD  LOS - 3  Chief Complaint  Patient presents with   Chest Pain      Subjective: [facility] was seen and examined at bedside, denies any chest pain or shortness of breath. Remained comfortable in bed Daughter present at bedside  Brief Narrative:  Daniel Myers is a 76 y.o. male with medical history significant for  CAD (s/p cath in 09/2020 showing severe 3-vessel CAD and patient declined CABG and could possibly intervene on LAD but would be CTO and high-risk, therefore medical management recommended), HFrEF (EF 30-35% in 10/2021), paroxysmal atrial fibrillation (s/p DCCV in 10/2020 and has been continued on Amiodarone), HTN, HLD, Stage 3 CKD and history of CVA, renal transplant admitted on 11/16/2021 with NSTEMI in the setting of chest pain and dyspnea    -Assessment and Plan:  1)NSTEMI/CAD-prior cardiac cath consistent with severe three-vessel CAD--  -s/p cath in 09/2020 showing severe 3-vessel CAD and patient declined CABG and could possibly intervene on LAD but would be CTO and high-risk, therefore medical management recommended) -No further chest pains -Troponin 672>>1,331>> 5,874 patient previously deemed not to be a candidate for PCI, patient previously declined CABG  -Patient and daughter declined transfer to [facility] for possible LHC and intervention -Cardiology consulted and continue to follow closely: Completed 48 hours of heparin gtt. Continue ASA '81mg'$  daily, Atorvastatin '40mg'$  daily, Zetia '10mg'$  daily and Toprol-XL '100mg'$  daily  -Please note that patient was not on aspirin PTA LDL 34, HDL 27, T chol 72----Burkart recommended to continue after statin -Per cardiologist okay to stop IV heparin and PTA Eliquis  2)PAFib----s/p DCCV in 10/2020  -Echo from  11/17/2021 showed low EF and biatrial enlargement, no aortic stenosis and No mitral stenosis -Continue metoprolol and Amiodarone  3)S/p  prior renal transplant with CKD 4--with recurrent hyperkalemia and metabolic acidosis -Suspect that Bactrim use is contributory to patient's recurrent hyperkalemia -Continue bicarb -Continue Lokelma---potassium is down to 5.7 from 6.4 -Creatinine trending up a bit---nephrology consult requested especially given need for diuresis at this time -Continue tacrolimus, CellCept, prednisone and Bactrim -Renally adjust medications, avoid nephrotoxic agents / dehydration  / hypotension   4) HFrEF--patient with history of combined diastolic and systolic dysfunction CHF -Echo from 11/17/2021 shows EF is down to 20 to 25% with global hypokinesis of the left ventricle and grade 3 diastolic dysfunction -EF was 30 to 35% back in September 2023 11/18/21 -Clinically appears to have some CHF distribution at this time -BNP trending up, auscultative Lee patient sounds crackly -Per cardiologist okay to give IV diuretics  5)DM2-A1c 6.2 reflecting excellent diabetic control PTA  6) chronic anemia--- suspect related to CKD Monitor closely and consider Procrit/ESA -No bleeding concerns at this time  7) recent COVID-19 infection and URI----supportive and symptomatic treatment advised  8)Generalized weakness and falls--- history of rib fracture on 10/29/2021 fall at home -Anticipate patient will need home health PT postdischarge  9)Social/Ethics--plan of care discussed with patient and patient's daughter at [facility] -Patient is a DNR/DNI without limitations to treatment  Disposition/Need for in-Hospital Stay- patient unable to be discharged at this time due to -NSTEMI-now with CHF exacerbation requiring IV diuretics with close monitoring of renal parameters  Status is: Inpatient   Disposition: The patient is from: Home  Anticipated d/c is to: Home               Anticipated d/c date is: Likely in a.m. if remains stable              Patient currently is not medically stable to d/c. Barriers: Not Clinically Stable-   Code Status :  -  Code Status: DNR   Family Communication:   Discussed with daughter at bedside.  DVT Prophylaxis  :   - SCDs/iv Heparin  apixaban (ELIQUIS) tablet 5 mg   Lab Results  Component Value Date   PLT 131 (L) 11/19/2021   Inpatient Medications  Scheduled Meds:  amiodarone  100 mg Oral Daily   apixaban  5 mg Oral BID   aspirin EC  81 mg Oral Daily   atorvastatin  40 mg Oral Daily   Chlorhexidine Gluconate Cloth  6 each Topical Q0600   ezetimibe  10 mg Oral Daily   fluticasone  1 spray Each Nare Daily   insulin aspart  0-9 Units Subcutaneous TID WC   insulin NPH Human  10 Units Subcutaneous BID AC & HS   metoprolol succinate  100 mg Oral Daily   mycophenolate  500 mg Oral BID   predniSONE  5 mg Oral Q breakfast   sodium bicarbonate  1,300 mg Oral BID   sodium chloride flush  3 mL Intravenous Q12H   sulfamethoxazole-trimethoprim  0.5 tablet Oral Q M,W,F   tacrolimus  1 mg Oral BID   Continuous Infusions:  sodium chloride     PRN Meds:.sodium chloride, acetaminophen, albuterol, ALPRAZolam, HYDROcodone-acetaminophen, nitroGLYCERIN, ondansetron (ZOFRAN) IV, sodium chloride flush   Anti-infectives (From admission, onward)    Start     Dose/Rate Route Frequency Ordered Stop   11/18/21 1115  sulfamethoxazole-trimethoprim (BACTRIM DS) 800-160 MG per tablet 0.5 tablet        0.5 tablet Oral Every M-W-F 11/18/21 1025     11/18/21 1000  sulfamethoxazole-trimethoprim (BACTRIM) 400-80 MG per tablet 1 tablet  Status:  Discontinued        1 tablet Oral Every M-W-F 11/16/21 2209 11/18/21 1025       Objective: Vitals:   11/19/21 0830 11/19/21 0930 11/19/21 1000 11/19/21 1118  BP: (!) 127/42 (!) 135/51 (!) 129/51   Pulse: 67  71   Resp:      Temp:    98.8 F (37.1 C)  TempSrc:    Oral  SpO2: 97%  95%    Weight:      Height:        Intake/Output Summary (Last 24 hours) at 11/19/2021 1201 Last data filed at 11/19/2021 1151 Gross per 24 hour  Intake 540 ml  Output 5200 ml  Net -4660 ml   Filed Weights   11/16/21 1745 11/16/21 1920 11/19/21 0500  Weight: 95.8 kg 96.2 kg 96.3 kg     Physical Exam:   General:  AAO x 3,  cooperative, no distress;   HEENT:  Normocephalic, PERRL, otherwise with in Normal limits   Neuro:  CNII-XII intact. , normal motor and sensation, reflexes intact   Lungs:   Clear to auscultation BL, Respirations unlabored,  No wheezes / crackles  Cardio:    S1/S2, RRR, No murmure, No Rubs or Gallops   Abdomen:  Soft, non-tender, bowel sounds active all four quadrants, no guarding or peritoneal signs.  Muscular  skeletal:  Limited exam -global generalized weaknesses - in bed, able to move all 4 extremities,   2+ pulses,  symmetric, No pitting edema  Skin:  Dry, warm to touch, negative for any Rashes,  Wounds: Please see nursing documentation         Data Reviewed: I have personally reviewed following labs and imaging studies  CBC: Recent Labs  Lab 11/16/21 1910 11/16/21 2016 11/17/21 0500 11/18/21 0346 11/19/21 0420  WBC 5.3  --  3.1* 3.4* 5.5  HGB 9.9* 9.2* 8.7* 8.9* 8.4*  HCT 32.0* 27.0* 29.1* 29.4* 27.1*  MCV 92.2  --  94.2 93.0 92.2  PLT 153  --  151 148* 371*   Basic Metabolic Panel: Recent Labs  Lab 11/16/21 1910 11/16/21 2016 11/17/21 0500 11/17/21 1721 11/18/21 0346 11/19/21 0420  NA 135 133* 134* 133* 140 135  K 6.4* 6.3* 5.7* 5.7* 6.2* 4.6  CL 109 109 109 106 112* 107  CO2 19*  --  19* 21* 21* 23  GLUCOSE 160* 145* 141* 139* 118* 123*  BUN 40* 48* 40* 42* 42* 40*  CREATININE 1.96* 2.00* 1.94* 2.06* 2.18* 2.18*  CALCIUM 7.7*  --  7.8* 7.7* 8.0* 7.7*   GFR: Estimated Creatinine Clearance: 34.1 mL/min (A) (by C-G formula based on SCr of 2.18 mg/dL (H)).  Recent Results (from the past 240 hour(s))  SARS Coronavirus 2 by RT  PCR (hospital order, performed in [facility] hospital lab) *cepheid single result test* Anterior Nasal Swab     Status: None   Collection Time: 11/16/21 10:10 PM   Specimen: Anterior Nasal Swab  Result Value Ref Range Status   SARS Coronavirus 2 by RT PCR NEGATIVE NEGATIVE Final    Comment: (NOTE) SARS-CoV-2 target nucleic acids are NOT DETECTED.  The SARS-CoV-2 RNA is generally detectable in upper and lower respiratory specimens during the acute phase of infection. The lowest concentration of SARS-CoV-2 viral copies this assay can detect is 250 copies / mL. A negative result does not preclude SARS-CoV-2 infection and should not be used as the sole basis for treatment or other patient management decisions.  A negative result may occur with improper specimen collection / handling, submission of specimen other than nasopharyngeal swab, presence of viral mutation(s) within the areas targeted by this assay, and inadequate number of viral copies (<250 copies / mL). A negative result must be combined with clinical observations, patient history, and epidemiological information.  Fact Sheet for Patients:   https://www.patel.info/  Fact Sheet for Healthcare Providers: https://hall.com/  This test is not yet approved or  cleared by the [place] FDA and has been authorized for detection and/or diagnosis of SARS-CoV-2 by FDA under an Emergency Use Authorization (EUA).  This EUA will remain in effect (meaning this test can be used) for the duration of the COVID-19 declaration under Section 564(b)(1) of the Act, 21 U.S.C. section 360bbb-3(b)(1), unless the authorization is terminated or revoked sooner.  Performed at [facility], [address], [place], [place] [address]      Radiology Studies: US Renal Transplant w/Doppler  Result Date: 11/19/2021 CLINICAL DATA:  [id number] AKI (acute kidney injury) ([place]) [id number] EXAM: ULTRASOUND OF RENAL  TRANSPLANT WITH RENAL DOPPLER ULTRASOUND TECHNIQUE: Ultrasound examination of the renal transplant was performed with gray-scale, color and duplex doppler evaluation. COMPARISON:  None Available. FINDINGS: Transplant kidney location: Right lower quadrant Transplant Kidney: Renal measurements: 10.4 x 7.1 x 6.3 cm = volume: 243.56m. No hydronephrosis. There is a 3.3 cm simple appearing cyst in the upper pole. Color flow in the main renal artery:  Yes Color flow in the main renal vein:  Yes Duplex Doppler Evaluation: Main Renal Artery Velocity: 109.9 cm/sec Main Renal Artery Resistive Index: 0.83 Venous waveform in main renal vein:  Present Intrarenal resistive index in upper pole:  0.80 (normal 0.6-0.8; equivocal 0.8-0.9; abnormal >= 0.9) Intrarenal resistive index in lower pole: 0.61 (normal 0.6-0.8; equivocal 0.8-0.9; abnormal >= 0.9) Bladder: There is layering avascular material in the posterior bladder. Ureteral jets are not visualized. Other findings:  The native kidneys are atrophic. IMPRESSION: Layering avascular material in the posterior bladder could represent blood products or infectious debris, recommend correlation with urinalysis. Patent renal transplant vasculature, with mildly elevated resistive index in the upper pole, nonspecific. No hydronephrosis. Electronically Signed   By: Maurine Simmering M.D.   On: 11/19/2021 11:02    Scheduled Meds:  amiodarone  100 mg Oral Daily   apixaban  5 mg Oral BID   aspirin EC  81 mg Oral Daily   atorvastatin  40 mg Oral Daily   Chlorhexidine Gluconate Cloth  6 each Topical Q0600   ezetimibe  10 mg Oral Daily   fluticasone  1 spray Each Nare Daily   insulin aspart  0-9 Units Subcutaneous TID WC   insulin NPH Human  10 Units Subcutaneous BID AC & HS   metoprolol succinate  100 mg Oral Daily   mycophenolate  500 mg Oral BID   predniSONE  5 mg Oral Q breakfast   sodium bicarbonate  1,300 mg Oral BID   sodium chloride flush  3 mL Intravenous Q12H    sulfamethoxazole-trimethoprim  0.5 tablet Oral Q M,W,F   tacrolimus  1 mg Oral BID   Continuous Infusions:  sodium chloride      LOS: 3 days   Deatra James M.D on 11/19/2021 at 12:01 PM  Go to [email or web address] - for contact info  [facility] - Office  [phone]  If 7PM-7AM, please contact night-coverage [email or web address] 11/19/2021, 12:01 PM

## 2021-11-19 NOTE — Hospital Course (Signed)
[facility] Myers is a 76 y.o. male with medical history significant for  CAD (s/p cath in 09/2020 showing severe 3-vessel CAD and patient declined CABG and could possibly intervene on LAD but would be CTO and high-risk, therefore medical management recommended), HFrEF (EF 30-35% in 10/2021), paroxysmal atrial fibrillation (s/p DCCV in 10/2020 and has been continued on Amiodarone), HTN, HLD, Stage 3 CKD and history of CVA, renal transplant admitted on 11/16/2021 with NSTEMI in the setting of chest pain and dyspnea     1)NSTEMI/CAD-prior cardiac cath consistent with severe three-vessel CAD--  -s/p cath in 09/2020 showing severe 3-vessel CAD and patient declined CABG and could possibly intervene on LAD but would be CTO and high-risk, therefore medical management recommended) -No further chest pains -Troponin 672>>1,331>> 5,874 patient previously deemed not to be a candidate for PCI, patient previously declined CABG  -Patient and daughter declined transfer to [facility] for possible LHC and intervention -Cardiology consulted and continue to follow closely: Completed 48 hours of heparin gtt. Continue ASA '81mg'$  daily, Atorvastatin '40mg'$  daily, Zetia '10mg'$  daily and Toprol-XL '100mg'$  daily  -Please note that patient was not on aspirin PTA LDL 34, HDL 27, T chol 72----Burkart recommended to continue after statin -Per cardiologist okay to stop IV heparin and PTA Eliquis  2)PAFib----s/p DCCV in 10/2020  -Echo from 11/17/2021 showed low EF and biatrial enlargement, no aortic stenosis and No mitral stenosis -Continue metoprolol and Amiodarone  3)S/p  prior renal transplant with CKD 4--with recurrent hyperkalemia and metabolic acidosis -Suspect that Bactrim use is contributory to patient's recurrent hyperkalemia -Continue bicarb -Continue Lokelma---potassium is down to 5.7 from 6.4 -Creatinine trending up a bit---nephrology consult requested especially given need for diuresis at this time -Continue tacrolimus,  CellCept, prednisone and Bactrim -Renally adjust medications, avoid nephrotoxic agents / dehydration  / hypotension   4) HFrEF--patient with history of combined diastolic and systolic dysfunction CHF -Echo from 11/17/2021 shows EF is down to 20 to 25% with global hypokinesis of the left ventricle and grade 3 diastolic dysfunction -EF was 30 to 35% back in September 2023 11/18/21 -Clinically appears to have some CHF distribution at this time -BNP trending up, auscultative Lee patient sounds crackly -Per cardiologist okay to give IV diuretics  5)DM2-A1c 6.2 reflecting excellent diabetic control PTA  6) chronic anemia--- suspect related to CKD Monitor closely and consider Procrit/ESA -No bleeding concerns at this time  7) recent COVID-19 infection and URI----supportive and symptomatic treatment advised  8)Generalized weakness and falls--- history of rib fracture on 10/29/2021 fall at home -Anticipate patient will need home health PT postdischarge  9)Social/Ethics--plan of care discussed with patient and patient's daughter at [facility] -Patient is a DNR/DNI without limitations to treatment      Disposition: The patient is from: Home              Anticipated d/c is to: Home

## 2021-11-19 NOTE — Consult Note (Signed)
Nephrology Consult   Assessment/Recommendations:   AKI on CKD 3, DDKT 2008 -Baseline creatinine seems to be ranging around 1.8-2 range -given concern for CRS, would c/w diuresis for today. Cr stable today -will check UA w/ microscopy and renal transplant ultrasound -c/w tacrolimus '1mg'$  BID, cellcept '500mg'$  BID. Will check FK level (goal 4-7) -Avoid nephrotoxic medications including NSAIDs and iodinated intravenous contrast exposure unless the latter is absolutely indicated.  Preferred narcotic agents for pain control are hydromorphone, fentanyl, and methadone. Morphine should not be used. Avoid Baclofen and avoid oral sodium phosphate and magnesium citrate based laxatives / bowel preps. Continue strict Input and Output monitoring. 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 Volume status seems to have already improved. Will dose lasix at '40mg'$  IV today and can likely resume home PO lasix thereafter  NSTEMI -cardiology following  Hyperkalemia -resolved -aldactone and lisinopril stopped  Hyponatremia -suspecting secondary to hypervolemia -improved to 295 today  Metabolic acidosis -s/p nahco3 supplementation, bicarb level acceptable today (23)  Hypertension: -BP currently acceptable  Anemia due to CKD: -Transfuse for Hgb<7 g/dL -will check iron panel to assess for Fe/ESA needs. Of note, on heparin with downtrending hgb.   DM2 -per primary  Protein-calorie malnutrition -alb 2.7, push protein  [facility] 11/19/2021 8:01 AM   _____________________________________________________________________________________   History of Present Illness: Daniel Myers is a/an 76 y.o. 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  Upon presentation his creatinine was 1.96, currently at 2.18.  He did receive Lasix yesterday for volume overload/elevated BNP and concern for cardiorenal syndrome.  Has also been having hyperkalemia with potassium of 6.4 on presentation.  Improved down to 4.6 today with Lokelma/Lasix. Patient seen and examined in ICU. He reports that his breathing is better as compared to yesterday. Denies chest pain, pain over allograft site, dysuria, hematuria.  Medications:  Current Facility-Administered Medications  Medication Dose Route Frequency Provider Last Rate Last Admin   0.9 %  sodium chloride infusion  250 mL Intravenous PRN Karmen Bongo, MD       acetaminophen (TYLENOL) tablet 650 mg  650 mg Oral Q4H PRN Karmen Bongo, MD       albuterol (PROVENTIL) (2.5 MG/3ML) 0.083% nebulizer solution 2.5 mg  2.5 mg Nebulization Q6H PRN Karmen Bongo, MD   2.5 mg at 11/17/21 2014   ALPRAZolam Duanne Moron) tablet 0.25 mg  0.25 mg Oral QHS PRN Karmen Bongo, MD   0.25 mg at 11/18/21 2137   amiodarone (PACERONE) tablet 100 mg  100 mg Oral Daily Karmen Bongo, MD   100 mg at 11/18/21 0844   apixaban (ELIQUIS) tablet 5 mg  5 mg Oral BID Pixie Casino, MD   5 mg at 11/18/21 2137   aspirin EC tablet 81 mg  81 mg Oral Daily Bernerd Pho M, PA-C   81 mg at 11/18/21 0843   atorvastatin (LIPITOR) tablet 40 mg  40 mg Oral Daily Karmen Bongo, MD   40 mg at 11/18/21 0844   Chlorhexidine Gluconate Cloth 2 % PADS  6 each  6 each Topical Q0600 Karmen Bongo, MD   6 each at 11/19/21 0645   ezetimibe (ZETIA) tablet 10 mg  10 mg Oral Daily Karmen Bongo, MD   10 mg at 11/18/21 0843   fluticasone (FLONASE) 50 MCG/ACT nasal spray 1 spray  1 spray Each Nare  Daily Karmen Bongo, MD   1 spray at 11/18/21 0914   HYDROcodone-acetaminophen (NORCO/VICODIN) 5-325 MG per tablet 1 tablet  1 tablet Oral Q4H PRN Karmen Bongo, MD       insulin aspart (novoLOG) injection 0-9 Units  0-9 Units Subcutaneous TID WC Karmen Bongo, MD   2 Units at 11/18/21 1712   insulin NPH Human (NOVOLIN N) injection 10 Units  10 Units Subcutaneous BID AC & HS Karmen Bongo, MD   10 Units at 11/19/21 0741   metoprolol succinate (TOPROL-XL) 24 hr tablet 100 mg  100 mg Oral Daily Karmen Bongo, MD   100 mg at 11/18/21 0174   mycophenolate (CELLCEPT) capsule 500 mg  500 mg Oral BID Karmen Bongo, MD   500 mg at 11/18/21 2137   nitroGLYCERIN (NITROSTAT) SL tablet 0.4 mg  0.4 mg Sublingual Q5 Min x 3 PRN Karmen Bongo, MD       ondansetron [facility]) injection 4 mg  4 mg Intravenous Q6H PRN Karmen Bongo, MD   4 mg at 11/17/21 1939   predniSONE (DELTASONE) tablet 5 mg  5 mg Oral Q breakfast Karmen Bongo, MD   5 mg at 11/19/21 9449   sodium bicarbonate tablet 1,300 mg  1,300 mg Oral BID Karmen Bongo, MD   1,300 mg at 11/18/21 2137   sodium chloride flush (NS) 0.9 % injection 3 mL  3 mL Intravenous Lillia Mountain, MD   3 mL at 11/18/21 2144   sodium chloride flush (NS) 0.9 % injection 3 mL  3 mL Intravenous PRN Karmen Bongo, MD       sodium zirconium cyclosilicate (LOKELMA) packet 10 g  10 g Oral TID Roxan Hockey, MD   10 g at 11/18/21 2138   sulfamethoxazole-trimethoprim (BACTRIM DS) 800-160 MG per tablet 0.5 tablet  0.5 tablet Oral Q M,W,F Emokpae, Courage, MD   0.5 tablet at 11/18/21 1030   tacrolimus (PROGRAF) capsule 1 mg  1 mg Oral BID Karmen Bongo, MD   1 mg at 11/18/21 2137     ALLERGIES Tape, Trazodone and nefazodone, Mirtazapine, Elemental sulfur, and Sulfa antibiotics  MEDICAL HISTORY 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   nodular on left jawline - excision   Basal cell carcinoma 07/01/2014   nodular  on left hawling - CX3+5FU+excision   Basal cell carcinoma 10/10/2014   nodular on right forearm, middle - tx p bx   Basal cell carcinoma 02/11/2015   left neck - CX3 + excision   Basal cell carcinoma 06/14/2016   left jawline - CX3+5FU   Basal cell carcinoma 02/22/2017   superficial and nodular on left neck - excision   Basal cell carcinoma 04/11/2018   superficial and nodular on right inferior forearm - CX3+Cautery+5FU   Chronic kidney disease    History of renal transplant    Hyperlipidemia    Hypertension    NSTEMI (non-ST elevated myocardial infarction) ([place]) 02/13/2020   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   Left [place] (well diff)   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   Right Breast (in situ)   SCCA (squamous cell carcinoma) of skin  08/17/2021   Left Forearm - anterior (mod diff)   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   Neck - anterior (mod diff)   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   in situ on left arm - CX3+5FU   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   hypertrophic on left ear - CX3+5FU   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   in situ on right temple - CX3+5FU   Squamous cell carcinoma of skin 11/08/2011   right upper forearm - tx p bx   Squamous cell carcinoma of skin 11/08/2011   left upper forearm - tx p bx   Squamous cell carcinoma of skin 11/08/2011   left hand - tx p bx   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/27/2013   in situ on left lower back - CX3+5FU   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/27/2013   in situ on left forehead - CX3+5FU   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/27/2013   in situ on right temple - watch per ST   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/27/2013   in situ on right forearm - CX3+5FU   Squamous cell carcinoma of skin 07/01/2014   well differentiated on right sideburn - CX3+5FU   Squamous cell carcinoma of skin 07/01/2014   in situ on left shoulder - CX3+5FU   Squamous cell  carcinoma of skin 07/01/2014   in situ on right forearm, proximal - CX3+5FU+Cautery   Squamous cell carcinoma of skin 07/01/2014   in situ on right forearm, distal - CX3+5FU   Squamous cell carcinoma of skin 09/30/2015   in situ on posterior left ear - CX3+5FU   Squamous cell carcinoma of skin 02/22/2017   in situ on right upper arm - CX3+5FU   Squamous cell carcinoma of skin 02/22/2017   in situ on left upper arm - CX3+5FU   Squamous cell carcinoma of skin 04/11/2018   in situ on right temple - CX3+5FU   Squamous cell carcinoma of skin 04/11/2018   in situ on lateral right arm - MOHs   Squamous cell carcinoma of skin 04/11/2018   in situ on right upper arm - MOHs   Squamous cell carcinoma of skin 04/11/2018   in situ on left sideburn   Squamous cell carcinoma of skin 04/11/2018   in situ on right flank - tx p bx   Squamous cell carcinoma of skin 09/28/2018   in situ on right inner ear - tx p bx   Squamous cell carcinoma of skin 09/28/2018   in situ on left inner ear - tx p bx   Squamous cell carcinoma of skin 09/28/2018   in situ on right arm - tx p bx   Squamous cell carcinoma of skin 03/21/2019   in situ on right antihelix ([place] treated topically)   Squamous cell carcinoma of skin 03/21/2019   in situ on right neck - CX3+5FU   Squamous cell carcinoma of skin 03/21/2019   in situ on left outer eye, inf (MOHs done 05/23/2019)   Type 2 diabetes mellitus ([place])    Unspecified atrial fibrillation ([place]) 02/17/2020     SOCIAL HISTORY Social History   Socioeconomic History   Marital status: Widowed    Spouse name: Not on file   Number of children: 3   Years of education: Not on file   Highest education level: Not on file  Occupational History   Not on file  Tobacco Use   Smoking status: Some  Days    Types: Pipe   Smokeless tobacco: Never  Vaping Use   Vaping Use: Never used  Substance and Sexual Activity   Alcohol use: Never   Drug use: Never   Sexual activity: Not  Currently  Other Topics Concern   Not on file  Social History Narrative   Wife passed away in 05/19/2007.   2 daughters, 1 son. All live close by.    3 grandchildren.    Social Determinants of Health   Financial Resource Strain: Low Risk  (04/15/2021)   Overall Financial Resource Strain (CARDIA)    Difficulty of Paying Living Expenses: Not hard at all  Food Insecurity: No Food Insecurity (04/15/2021)   Hunger Vital Sign    Worried About Running Out of Food in the Last Year: Never true    Ran Out of Food in the Last Year: Never true  Transportation Needs: No Transportation Needs (04/15/2021)   PRAPARE - Hydrologist (Medical): No    Lack of Transportation (Non-Medical): No  Physical Activity: Insufficiently Active (04/15/2021)   Exercise Vital Sign    Days of Exercise per Week: 7 days    Minutes of Exercise per Session: 20 min  Stress: No Stress Concern Present (04/15/2021)   [place]    Feeling of Stress : Not at all  Social Connections: Moderately Integrated (04/15/2021)   Social Connection and Isolation Panel [NHANES]    Frequency of Communication with Friends and Family: More than three times a week    Frequency of Social Gatherings with Friends and Family: More than three times a week    Attends Religious Services: 1 to 4 times per year    Active Member of [employer] or Organizations: No    Attends Archivist Meetings: 1 to 4 times per year    Marital Status: Widowed  Intimate Partner Violence: Not At Risk (04/15/2021)   Humiliation, Afraid, Rape, and Kick questionnaire    Fear of Current or Ex-Partner: No    Emotionally Abused: No    Physically Abused: No    Sexually Abused: No     FAMILY HISTORY Family History  Problem Relation Age of Onset   Clotting disorder Mother    Hypertension Sister    Diabetes Sister      Review of Systems: 12 systems reviewed Otherwise as per HPI, all  other systems reviewed and negative  Physical Exam: Vitals:   11/19/21 0300 11/19/21 0400  BP: 135/60   Pulse: (!) 52   Resp:    Temp:  98.7 F (37.1 C)  SpO2: 99%    No intake/output data recorded.  Intake/Output Summary (Last 24 hours) at 11/19/2021 0801 Last data filed at 11/18/2021 2000 Gross per 24 hour  Intake 300 ml  Output 4400 ml  Net -4100 ml   General: well-appearing, no acute distress HEENT: anicteric sclera, oropharynx clear without lesions CV: regular rate, normal rhythm, no murmurs, no gallops, no rubs Lungs: diminished air entry bibasilar, normal WOB Abd: soft, non-tender, non-distended, no tenderness on palpation over RLQ txp site Skin: no visible lesions or rashes Psych: alert, engaged, appropriate mood and affect Musculoskeletal: no sig edema b/l Les Neuro: normal speech, no gross focal deficits   Test Results Reviewed Lab Results  Component Value Date   NA 135 11/19/2021   K 4.6 11/19/2021   CL 107 11/19/2021   CO2 23 11/19/2021   BUN 40 (H) 11/19/2021  CREATININE 2.18 (H) 11/19/2021   CALCIUM 7.7 (L) 11/19/2021   ALBUMIN 2.7 (L) 11/19/2021   PHOS 2.9 02/17/2020     I have reviewed all relevant outside healthcare records related to the patient's kidney injury.

## 2021-11-19 NOTE — Progress Notes (Signed)
Rounding Note    Patient Name: Daniel Myers Date of Encounter: 11/19/2021  [place] Cardiologist: Rozann Lesches, MD  AHF: Dr. Aundra Dubin  Subjective   Net -4.1 L yesterday, creatinine stable at 2.18.  Potassium improved to 4.6.  Reports dyspnea improved.  Denies any chest pain  Inpatient Medications    Scheduled Meds:  amiodarone  100 mg Oral Daily   apixaban  5 mg Oral BID   aspirin EC  81 mg Oral Daily   atorvastatin  40 mg Oral Daily   Chlorhexidine Gluconate Cloth  6 each Topical Q0600   ezetimibe  10 mg Oral Daily   fluticasone  1 spray Each Nare Daily   insulin aspart  0-9 Units Subcutaneous TID WC   insulin NPH Human  10 Units Subcutaneous BID AC & HS   metoprolol succinate  100 mg Oral Daily   mycophenolate  500 mg Oral BID   predniSONE  5 mg Oral Q breakfast   sodium bicarbonate  1,300 mg Oral BID   sodium chloride flush  3 mL Intravenous Q12H   sodium zirconium cyclosilicate  10 g Oral TID   sulfamethoxazole-trimethoprim  0.5 tablet Oral Q M,W,F   tacrolimus  1 mg Oral BID   Continuous Infusions:  sodium chloride     PRN Meds: sodium chloride, acetaminophen, albuterol, ALPRAZolam, HYDROcodone-acetaminophen, nitroGLYCERIN, ondansetron (ZOFRAN) IV, sodium chloride flush   Vital Signs    Vitals:   11/19/21 0100 11/19/21 0300 11/19/21 0400 11/19/21 0500  BP: (!) 132/45 135/60    Pulse: (!) 57 (!) 52    Resp:      Temp:   98.7 F (37.1 C)   TempSrc:      SpO2: 99% 99%    Weight:    96.3 kg  Height:        Intake/Output Summary (Last 24 hours) at 11/19/2021 0931 Last data filed at 11/18/2021 2000 Gross per 24 hour  Intake 300 ml  Output 4400 ml  Net -4100 ml       11/19/2021    5:00 AM 11/16/2021    7:20 PM 11/16/2021    5:45 PM  Last 3 Weights  Weight (lbs) 212 lb 4.9 oz 212 lb 211 lb 3.2 oz  Weight (kg) 96.3 kg 96.163 kg 95.8 kg      Telemetry    NSR, HR in 60's to 70's with occasional PAC's and PVC's.  - Personally  Reviewed  ECG    No new tracings.   Physical Exam   GEN: Pleasant male appearing in no acute distress.   Neck: No JVD Cardiac: RRR, no murmurs, rubs, or gallops.  Respiratory: Scattered rhonchi GI: Soft, nontender, non-distended  MS: No edema Neuro:  Nonfocal  Psych: Normal affect   Labs    High Sensitivity Troponin:   Recent Labs  Lab 11/16/21 1910 11/16/21 2113 11/17/21 0824  TROPONINIHS 672* 1,331* 5,874*      Chemistry Recent Labs  Lab 11/17/21 1721 11/18/21 0346 11/19/21 0420  NA 133* 140 135  K 5.7* 6.2* 4.6  CL 106 112* 107  CO2 21* 21* 23  GLUCOSE 139* 118* 123*  BUN 42* 42* 40*  CREATININE 2.06* 2.18* 2.18*  CALCIUM 7.7* 8.0* 7.7*  PROT  --   --  5.1*  ALBUMIN  --   --  2.7*  AST  --   --  18  ALT  --   --  13  ALKPHOS  --   --  116  BILITOT  --   --  0.8  GFRNONAA 33* 31* 31*  ANIONGAP '6 7 5     '$ Lipids  Recent Labs  Lab 11/17/21 0500  CHOL 72  TRIG 55  HDL 27*  LDLCALC 34  CHOLHDL 2.7     Hematology Recent Labs  Lab 11/17/21 0500 11/18/21 0346 11/19/21 0420  WBC 3.1* 3.4* 5.5  RBC 3.09* 3.16* 2.94*  HGB 8.7* 8.9* 8.4*  HCT 29.1* 29.4* 27.1*  MCV 94.2 93.0 92.2  MCH 28.2 28.2 28.6  MCHC 29.9* 30.3 31.0  RDW 14.8 14.8 14.8  PLT 151 148* 131*    Thyroid No results for input(s): "TSH", "FREET4" in the last 168 hours.  BNP Recent Labs  Lab 11/18/21 1009  BNP 2,663.0*    DDimer No results for input(s): "DDIMER" in the last 168 hours.   Radiology    DG Chest 2 View  Result Date: 11/16/2021 CLINICAL DATA:  Chest pain, shortness of breath EXAM: CHEST - 2 VIEW COMPARISON:  10/29/2021 FINDINGS: Mild left lower lobe/retrocardiac opacity, atelectasis versus pneumonia. Mild right basilar opacity, likely atelectasis. No frank interstitial edema. No pleural effusion or pneumothorax. Mild cardiomegaly. IMPRESSION: Mild bilateral lower lobe opacities, atelectasis versus pneumonia. Mild cardiomegaly.  No frank interstitial edema.  Electronically Signed   By: Julian Hy M.D.   On: 11/16/2021 19:47    Cardiac Studies   Echocardiogram: 11/17/2021 IMPRESSIONS     1. Left ventricular ejection fraction, by estimation, is 20 to 25%. The  left ventricle has severely decreased function. The left ventricle  demonstrates global hypokinesis. Left ventricular diastolic parameters are  consistent with Grade III diastolic  dysfunction (restrictive).   2. Right ventricular systolic function is mildly reduced. The right  ventricular size is normal.   3. Left atrial size was moderately dilated.   4. Right atrial size was moderately dilated.   5. The mitral valve is normal in structure. No evidence of mitral valve  regurgitation. No evidence of mitral stenosis.   6. The aortic valve is tricuspid. Aortic valve regurgitation is not  visualized. No aortic stenosis is present.   7. The inferior vena cava is normal in size with greater than 50%  respiratory variability, suggesting right atrial pressure of 3 mmHg.   Comparison(s): Prior images reviewed side by side. Prior EF described as  30-35%.   Patient Profile     76 y.o. male w/PMH of CAD (s/p cath in 09/2020 showing severe 3-vessel CAD and patient declined CABG and could possibly intervene on LAD but would be CTO and high-risk, therefore medical management recommended), HFrEF (EF 30-35% in 10/2021), paroxysmal atrial fibrillation (s/p DCCV in 10/2020 and has been continued on Amiodarone), HTN, HLD, Stage 3 CKD and history of CVA who is currently admitted for an NSTEMI.   Assessment & Plan    1. NSTEMI - He presented with chest pain and dyspnea, found to have a NSTEMI with Hs Troponin values trending up from 672 to 1331 to 5874. As discussed in the initial consult note, the patient has wished to pursue medical management as compared to undergoing invasive procedures such as a repeat cardiac catheterization. He has also elected DNR status.  - Completed 48 hours of heparin  gtt. Continue ASA '81mg'$  daily, Atorvastatin '40mg'$  daily, Zetia '10mg'$  daily and Toprol-XL '100mg'$  daily.    2. CAD - Prior catheterization in 09/2020 showed severe 3-vessel CAD and he declined CABG. He has elected for medical management of his NSTEMI this  admission and declined repeat catheterization.  - Continue ASA, Toprol-XL, Atorvastatin, Zetia and Heparin.    3. Acute on chronic combined heart failure - His EF was at 25-30% by echo in 05/2020 and had improved to 30-35% by recent echo last month. Repeat echo this admission shows his EF has declined to 20-25% with Grade 3 DD and RV function is mildly reduced.  - He remains on Toprol-XL '100mg'$  daily. Will plan to restart [place] and [place] prior to discharge.  -Given IV lasix 80 mg 10/11.  Seen by nephrology today, recommend IV lasix 40 mg daily today  4. Paroxysmal Atrial Fibrillation - He is s/p DCCV in 10/2020 and is maintaining NSR this admission. Remains on Amiodarone '100mg'$  daily and Toprol-XL '100mg'$  daily.  - He was on Eliquis '5mg'$  BID prior to admission which was held since receiving Heparin. Now back on Eliquis   5. HLD - FLP this admission shows his LDL is at 34. Continue Atorvastatin '40mg'$  daily and Zetia '10mg'$  daily.    6. 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 Was at 1.96 on admission and trending up to 2.18 yeststerday. K+ also elevated to 6.2 yesterday and Lokelma was given. He was on Lisinopril and Spironolactone prior to admission which are currently held.  -Cr stable at 2.18 today and K improved to 4.6 -Nephrology following   For questions or updates, please contact [place] Please consult [email or web address] for contact info under      Signed, Donato Heinz, MD  11/19/2021, 9:31 AM

## 2021-11-20 DIAGNOSIS — I214 Non-ST elevation (NSTEMI) myocardial infarction: Secondary | ICD-10-CM | POA: Diagnosis not present

## 2021-11-20 LAB — GLUCOSE, CAPILLARY
Glucose-Capillary: 170 mg/dL — ABNORMAL HIGH (ref 70–99)
Glucose-Capillary: 123 mg/dL — ABNORMAL HIGH (ref 70–99)
Glucose-Capillary: 171 mg/dL — ABNORMAL HIGH (ref 70–99)

## 2021-11-20 LAB — BASIC METABOLIC PANEL
Anion gap: 7 (ref 5–15)
BUN: 47 mg/dL — ABNORMAL HIGH (ref 8–23)
CO2: 23 mmol/L (ref 22–32)
Calcium: 7.6 mg/dL — ABNORMAL LOW (ref 8.9–10.3)
Chloride: 103 mmol/L (ref 98–111)
Creatinine, Ser: 2.51 mg/dL — ABNORMAL HIGH (ref 0.61–1.24)
GFR, Estimated: 26 mL/min — ABNORMAL LOW (ref >60)
Glucose, Bld: 133 mg/dL — ABNORMAL HIGH (ref 70–99)
Potassium: 4.3 mmol/L (ref 3.5–5.1)
Sodium: 133 mmol/L — ABNORMAL LOW (ref 135–145)

## 2021-11-20 LAB — IRON AND TIBC
Iron: 17 ug/dL — ABNORMAL LOW (ref 45–182)
Saturation Ratios: 8 % — ABNORMAL LOW (ref 17.9–39.5)
TIBC: 222 ug/dL — ABNORMAL LOW (ref 250–450)
UIBC: 205 ug/dL

## 2021-11-20 LAB — FERRITIN: Ferritin: 63 ng/mL (ref 24–336)

## 2021-11-20 LAB — CBC
HCT: 27.1 % — ABNORMAL LOW (ref 39.0–52.0)
Hemoglobin: 8.2 g/dL — ABNORMAL LOW (ref 13.0–17.0)
MCH: 28.2 pg (ref 26.0–34.0)
MCHC: 30.3 g/dL (ref 30.0–36.0)
MCV: 93.1 fL (ref 80.0–100.0)
Platelets: 120 10*3/uL — ABNORMAL LOW (ref 150–400)
RBC: 2.91 MIL/uL — ABNORMAL LOW (ref 4.22–5.81)
RDW: 14.7 % (ref 11.5–15.5)
WBC: 7.1 10*3/uL (ref 4.0–10.5)
nRBC: 0 % (ref 0.0–0.2)

## 2021-11-20 NOTE — Progress Notes (Signed)
Patient rested quietly throughout the night, no c/o pain or discomfort this shift, all meds taken, patient repositioned for comfort and with call bell in reach.

## 2021-11-20 NOTE — Progress Notes (Signed)
[facility] Progress Note    Assessment/ Plan:   AKI on CKD 3, DDKT 2008 -Baseline creatinine seems to be ranging around 1.8-2 range -given concern for CRS. Slight rise in Cr today-likely related to diuresis, hold diuresis today, can likely resume home lasix regimen of '20mg'$  every other day -txp u/s without any acute findings -c/w tacrolimus '1mg'$  BID, cellcept '500mg'$  BID. FK level drawn 10/13 (goal 4-7) -UA indicative of UTI, off SGLT2i during admit, will send ucx (on bactrim but for PJP ppx) -Avoid nephrotoxic medications including NSAIDs and iodinated intravenous contrast exposure unless the latter is absolutely indicated.  Preferred narcotic agents for pain control are hydromorphone, fentanyl, and methadone. Morphine should not be used. Avoid Baclofen and avoid oral sodium phosphate and magnesium citrate based laxatives / bowel preps. Continue strict Input and Output monitoring. 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 Volume status seems to have already improved. Can hold lasix today and can likely resume home PO lasix thereafter   NSTEMI -cardiology following   Hyperkalemia -resolved -aldactone and lisinopril stopped   Hyponatremia -suspecting secondary to hypervolemia -Na stable 188 today   Metabolic acidosis -on nahco3 supplementation, bicarb level acceptable today (23)   Hypertension: -BP currently acceptable   Anemia due to CKD: -Transfuse for Hgb<7 g/dL -iron deficient, would recommend ruling out bleed since on A/C, recheck Hgb   DM2 -per primary   Protein-calorie malnutrition -alb 2.7 on 10/12, push protein  Subjective:   Out of ICU now. Daughter at bedside. Patient currently reports feeling well, eager to go home. Denies any chest pain, SOB, swelling,  dysuria, hematuria, pain over allograft site.   Objective:   BP (!) 129/53   Pulse 60   Temp 98.2 F (36.8 C) (Oral)   Resp 20   Ht '5\' 10"'$  (1.778 m)   Wt 96.3 kg   SpO2 100%   BMI 30.46 kg/m   Intake/Output Summary (Last 24 hours) at 11/20/2021 0902 Last data filed at 11/19/2021 2100 Gross per 24 hour  Intake 720 ml  Output 800 ml  Net -80 ml   Weight change:   Physical Exam: Gen:NAD, sitting up at edge of bed eating breakfast CVS:S1S2, RRR Resp:cta bl CZY:SAYT nt/nd Ext:no sig edema Neuro: awake, alert  Imaging: US Renal Transplant w/Doppler  Result Date: 11/19/2021 CLINICAL DATA:  016010 AKI (acute kidney injury) ([place]) [id number] EXAM: ULTRASOUND OF RENAL TRANSPLANT WITH RENAL DOPPLER ULTRASOUND TECHNIQUE: Ultrasound examination of the renal transplant was performed with gray-scale, color and duplex doppler evaluation. COMPARISON:  None Available. FINDINGS: Transplant kidney location: Right lower quadrant Transplant Kidney: Renal measurements: 10.4 x 7.1 x 6.3 cm = volume: 243.70m. No hydronephrosis. There is a 3.3 cm simple appearing cyst in the upper pole. Color flow in the main renal artery:  Yes Color flow in the main renal vein:  Yes Duplex Doppler Evaluation: Main Renal Artery Velocity: 109.9 cm/sec Main Renal Artery Resistive Index: 0.83 Venous waveform in main renal vein:  Present Intrarenal resistive index in upper pole:  0.80 (normal 0.6-0.8; equivocal 0.8-0.9; abnormal >= 0.9) Intrarenal resistive index in lower pole: 0.61 (normal 0.6-0.8; equivocal 0.8-0.9; abnormal >= 0.9) Bladder: There is layering avascular material in the posterior bladder. Ureteral jets are not visualized. Other findings:  The native kidneys are atrophic. IMPRESSION: Layering avascular material in  the posterior bladder could represent blood products or infectious debris, recommend correlation with urinalysis. Patent renal transplant vasculature, with mildly elevated resistive index in the upper pole,  nonspecific. No hydronephrosis. Electronically Signed   By: Maurine Simmering M.D.   On: 11/19/2021 11:02    Labs: BMET Recent Labs  Lab 11/16/21 1910 11/16/21 2016 11/17/21 0500 11/17/21 1721 11/18/21 0346 11/19/21 0420 11/20/21 0416  NA 135 133* 134* 133* 140 135 133*  K 6.4* 6.3* 5.7* 5.7* 6.2* 4.6 4.3  CL 109 109 109 106 112* 107 103  CO2 19*  --  19* 21* 21* 23 23  GLUCOSE 160* 145* 141* 139* 118* 123* 133*  BUN 40* 48* 40* 42* 42* 40* 47*  CREATININE 1.96* 2.00* 1.94* 2.06* 2.18* 2.18* 2.51*  CALCIUM 7.7*  --  7.8* 7.7* 8.0* 7.7* 7.6*   CBC Recent Labs  Lab 11/16/21 1910 11/16/21 2016 11/17/21 0500 11/18/21 0346 11/19/21 0420  WBC 5.3  --  3.1* 3.4* 5.5  HGB 9.9* 9.2* 8.7* 8.9* 8.4*  HCT 32.0* 27.0* 29.1* 29.4* 27.1*  MCV 92.2  --  94.2 93.0 92.2  PLT 153  --  151 148* 131*    Medications:     amiodarone  100 mg Oral Daily   apixaban  5 mg Oral BID   aspirin EC  81 mg Oral Daily   atorvastatin  40 mg Oral Daily   Chlorhexidine Gluconate Cloth  6 each Topical Q0600   ezetimibe  10 mg Oral Daily   fluticasone  1 spray Each Nare Daily   insulin aspart  0-9 Units Subcutaneous TID WC   insulin NPH Human  10 Units Subcutaneous BID AC & HS   metoprolol succinate  100 mg Oral Daily   mycophenolate  500 mg Oral BID   predniSONE  5 mg Oral Q breakfast   sodium bicarbonate  1,300 mg Oral BID   sodium chloride flush  3 mL Intravenous Q12H   sulfamethoxazole-trimethoprim  0.5 tablet Oral Q M,W,F   tacrolimus  1 mg Oral BID      Gean Quint, MD [facility] 11/20/2021, 9:02 AM

## 2021-11-20 NOTE — Progress Notes (Signed)
Rounding Note    Patient Name: Daniel Myers Date of Encounter: 11/20/2021  [place] Cardiologist: Daniel Lesches, MD  AHF: Dr. Aundra Dubin  Subjective   Doe has resolved. Daughter at bedside. No symptoms.  Inpatient Medications    Scheduled Meds:  amiodarone  100 mg Oral Daily   apixaban  5 mg Oral BID   aspirin EC  81 mg Oral Daily   atorvastatin  40 mg Oral Daily   Chlorhexidine Gluconate Cloth  6 each Topical Q0600   ezetimibe  10 mg Oral Daily   fluticasone  1 spray Each Nare Daily   insulin aspart  0-9 Units Subcutaneous TID WC   insulin NPH Human  10 Units Subcutaneous BID AC & HS   metoprolol succinate  100 mg Oral Daily   mycophenolate  500 mg Oral BID   predniSONE  5 mg Oral Q breakfast   sodium bicarbonate  1,300 mg Oral BID   sodium chloride flush  3 mL Intravenous Q12H   sulfamethoxazole-trimethoprim  0.5 tablet Oral Q M,W,F   tacrolimus  1 mg Oral BID   Continuous Infusions:  sodium chloride     PRN Meds: sodium chloride, acetaminophen, albuterol, ALPRAZolam, HYDROcodone-acetaminophen, nitroGLYCERIN, ondansetron (ZOFRAN) IV, sodium chloride flush   Vital Signs    Vitals:   11/19/21 1550 11/19/21 2055 11/20/21 0122 11/20/21 0526  BP: 131/61 (!) 123/50 (!) 122/51 (!) 129/53  Pulse: 63 63 (!) 58 60  Resp:  20    Temp: 98.3 F (36.8 C) 98.4 F (36.9 C) 98.4 F (36.9 C) 98.2 F (36.8 C)  TempSrc:  Oral Oral Oral  SpO2: 97% 94% 92% 100%  Weight:      Height:        Intake/Output Summary (Last 24 hours) at 11/20/2021 1102 Last data filed at 11/19/2021 2100 Gross per 24 hour  Intake 720 ml  Output 800 ml  Net -80 ml      11/19/2021    5:00 AM 11/16/2021    7:20 PM 11/16/2021    5:45 PM  Last 3 Weights  Weight (lbs) 212 lb 4.9 oz 212 lb 211 lb 3.2 oz  Weight (kg) 96.3 kg 96.163 kg 95.8 kg      Telemetry    SR with occaisonal ectopy  - Personally Reviewed  ECG    No new tracings.   Physical Exam   GEN: Pleasant male  appearing in no acute distress.   Neck: No JVD Cardiac: RRR, no murmurs, rubs, or gallops.  Respiratory: Scattered rhonchi, normal respiratory rate no increase WOB GI: Soft, nontender, non-distended  MS: No edema Neuro:  Nonfocal  Psych: Normal affect   Labs    High Sensitivity Troponin:   Recent Labs  Lab 11/16/21 1910 11/16/21 2113 11/17/21 0824  TROPONINIHS 672* 1,331* 5,874*     Chemistry Recent Labs  Lab 11/18/21 0346 11/19/21 0420 11/20/21 0416  NA 140 135 133*  K 6.2* 4.6 4.3  CL 112* 107 103  CO2 21* 23 23  GLUCOSE 118* 123* 133*  BUN 42* 40* 47*  CREATININE 2.18* 2.18* 2.51*  CALCIUM 8.0* 7.7* 7.6*  PROT  --  5.1*  --   ALBUMIN  --  2.7*  --   AST  --  18  --   ALT  --  13  --   ALKPHOS  --  116  --   BILITOT  --  0.8  --   GFRNONAA 31* 31* 26*  ANIONGAP  $'7 5 7    'f$ Lipids  Recent Labs  Lab 11/17/21 0500  CHOL 72  TRIG 55  HDL 27*  LDLCALC 34  CHOLHDL 2.7    Hematology Recent Labs  Lab 11/18/21 0346 11/19/21 0420 11/20/21 0509  WBC 3.4* 5.5 7.1  RBC 3.16* 2.94* 2.91*  HGB 8.9* 8.4* 8.2*  HCT 29.4* 27.1* 27.1*  MCV 93.0 92.2 93.1  MCH 28.2 28.6 28.2  MCHC 30.3 31.0 30.3  RDW 14.8 14.8 14.7  PLT 148* 131* 120*   Thyroid No results for input(s): "TSH", "FREET4" in the last 168 hours.  BNP Recent Labs  Lab 11/18/21 1009  BNP 2,663.0*    DDimer No results for input(s): "DDIMER" in the last 168 hours.   Radiology    DG Chest 2 View  Result Date: 11/16/2021 CLINICAL DATA:  Chest pain, shortness of breath EXAM: CHEST - 2 VIEW COMPARISON:  10/29/2021 FINDINGS: Mild left lower lobe/retrocardiac opacity, atelectasis versus pneumonia. Mild right basilar opacity, likely atelectasis. No frank interstitial edema. No pleural effusion or pneumothorax. Mild cardiomegaly. IMPRESSION: Mild bilateral lower lobe opacities, atelectasis versus pneumonia. Mild cardiomegaly.  No frank interstitial edema. Electronically Signed   By: Julian Hy M.D.    On: 11/16/2021 19:47    Cardiac Studies   Echocardiogram: 11/17/2021 IMPRESSIONS     1. Left ventricular ejection fraction, by estimation, is 20 to 25%. The  left ventricle has severely decreased function. The left ventricle  demonstrates global hypokinesis. Left ventricular diastolic parameters are  consistent with Grade III diastolic  dysfunction (restrictive).   2. Right ventricular systolic function is mildly reduced. The right  ventricular size is normal.   3. Left atrial size was moderately dilated.   4. Right atrial size was moderately dilated.   5. The mitral valve is normal in structure. No evidence of mitral valve  regurgitation. No evidence of mitral stenosis.   6. The aortic valve is tricuspid. Aortic valve regurgitation is not  visualized. No aortic stenosis is present.   7. The inferior vena cava is normal in size with greater than 50%  respiratory variability, suggesting right atrial pressure of 3 mmHg.   Comparison(s): Prior images reviewed side by side. Prior EF described as  30-35%.   Patient Profile     76 y.o. male w/PMH of CAD (s/p cath in 09/2020 showing severe 3-vessel CAD and patient declined CABG and could possibly intervene on LAD but would be CTO and high-risk, therefore medical management recommended), HFrEF (EF 30-35% in 10/2021), paroxysmal atrial fibrillation (s/p DCCV in 10/2020 and has been continued on Amiodarone), HTN, HLD, Stage 3 CKD and history of CVA who is currently admitted for an NSTEMI.   Assessment & Plan     NSTEMI and CAD and HLD - see prior notes (consult) planned for medical management as - Completed 48 hours of heparin gtt. Continue ASA '81mg'$  daily, Atorvastatin '40mg'$  daily, Zetia '10mg'$  daily and Toprol-XL '100mg'$  daily.    Acute on chronic combined heart failure Complicated by CKD EF has declined to 20-25% with no plans for procedural evaulation - He remains on Toprol-XL '100mg'$  daily.  - will return hom verociguat at 2.5 mg will not  increase in two weeks (has near syncope at higher doses) - diuretics as per primary - needs BMP in one week at likely Farxiga restart - will eventually need ACEi restart (Entreso intolerant)  Paroxysmal Atrial Fibrillation - continue amiodarone, eliquis and BB   Discharged; we are working on expediting f/u  For questions or updates, please contact [place] Please consult [email or web address] for contact info under   Rudean Haskell, MD [facility]  [address] [place],  [address] [phone]  11:02 AM

## 2021-11-20 NOTE — Progress Notes (Signed)
This patient has been referred to [place] program. Unfortunately, he does not qualify for our outpatient program at this time.  Thank you, Lorelee Market, LPN [facility] Liaison [phone]

## 2021-11-20 NOTE — TOC Transition Note (Signed)
Transition of Care [facility]) - CM/SW Discharge Note   Patient Details  Name: Daniel Myers MRN: [id number] Date of Birth: 10/11/1945  [facility] CM/SW Contact:  Shade Flood, LCSW Phone Number: 11/20/2021, 11:23 AM   Clinical Narrative:     Pt stable for dc today per MD. [facility] order entered. Discussed with pt who is agreeable. CMS provider options reviewed and referred to [facility] as requested. Alvis Lemmings will follow up.  No other TOC needs for dc.  Final next level of care: [place] Barriers to Discharge: Barriers Resolved   Patient Goals and CMS Choice Patient states their goals for this hospitalization and ongoing recovery are:: go home CMS Medicare.gov Compare Post Acute Care list provided to:: Patient Choice offered to / list presented to : Patient  Discharge Placement                       Discharge Plan and Services In-house Referral: Clinical Social Work   Post Acute Care Choice: Home Health                    HH Arranged: RN [facility] Agency: [place] Date [place]: 11/20/21   Representative spoke with at [place]: [place] ([place]) Interventions     Readmission Risk Interventions    11/17/2021    3:51 PM  Readmission Risk Prevention Plan  Transportation Screening Complete  HRI or [place] Complete  Social Work Consult for [place] Planning/Counseling Complete  Palliative Care Screening Not Applicable  Medication Review [employer]) Complete

## 2021-11-20 NOTE — Progress Notes (Signed)
Nsg Discharge Note  Admit Date:  11/16/2021 Discharge date: 11/20/2021   Daniel Myers to be D/C'd Home per MD order.  AVS completed.   Patient/caregiver able to verbalize understanding.  Discharge Medication: Allergies as of 11/20/2021       Reactions   Tape Rash   Use paper tape.    Trazodone And Nefazodone Palpitations   tachycardia   Mirtazapine    imbalance   Elemental Sulfur Rash   Sulfa Antibiotics Rash        Medication List     STOP taking these medications    HYDROcodone-acetaminophen 5-325 MG tablet Commonly known as: NORCO/VICODIN   lisinopril 5 MG tablet Commonly known as: ZESTRIL   ReliOn Insulin Syringe 31G X 15/64" 0.5 ML Misc Generic drug: Insulin Syringe-Needle U-100   spironolactone 25 MG tablet Commonly known as: ALDACTONE   Verquvo 2.5 MG Tabs Generic drug: Vericiguat       TAKE these medications    acetaminophen 500 MG tablet Commonly known as: TYLENOL Take 500 mg by mouth every 8 (eight) hours as needed for moderate pain.   albuterol 108 (90 Base) MCG/ACT inhaler Commonly known as: VENTOLIN HFA Inhale 2 puffs into the lungs every 6 (six) hours as needed for wheezing or shortness of breath.   ALPRAZolam 0.25 MG tablet Commonly known as: XANAX Take 1 tablet (0.25 mg total) by mouth at bedtime as needed for anxiety.   amiodarone 200 MG tablet Commonly known as: PACERONE Take 0.5 tablets (100 mg total) by mouth daily.   atorvastatin 40 MG tablet Commonly known as: LIPITOR Take 1 tablet (40 mg total) by mouth daily.   dapagliflozin propanediol 10 MG Tabs tablet Commonly known as: Farxiga Take 1 tablet (10 mg total) by mouth daily before breakfast.   Eliquis 5 MG Tabs tablet Generic drug: apixaban Take 1 tablet by mouth twice daily   ezetimibe 10 MG tablet Commonly known as: Zetia Take 1 tablet (10 mg total) by mouth daily.   fluticasone 50 MCG/ACT nasal spray Commonly known as: FLONASE USE ONE SPRAY(S) IN EACH NOSTRIL  ONCE DAILY   furosemide 20 MG tablet Commonly known as: LASIX Take 1 tablet (20 mg total) by mouth every other day.   insulin NPH Human 100 UNIT/ML injection Commonly known as: NOVOLIN N 10 units AC breakfast and 10 units AC supper   insulin regular 100 units/mL injection Commonly known as: NOVOLIN R 16 units w bkfst and 13 units w dinner   Lokelma 10 g Pack packet Generic drug: sodium zirconium cyclosilicate Take 10 g by mouth as directed. By the HF clinic What changed: additional instructions   loratadine 10 MG tablet Commonly known as: CLARITIN Take 10 mg by mouth daily as needed for allergies.   metoprolol succinate 100 MG 24 hr tablet Commonly known as: TOPROL-XL Take 1 tablet (100 mg total) by mouth daily. Take with or immediately following a meal.   mycophenolate 250 MG capsule Commonly known as: CELLCEPT Take 500 mg by mouth 2 (two) times daily.   nitroGLYCERIN 0.4 MG SL tablet Commonly known as: NITROSTAT Place 1 tablet (0.4 mg total) under the tongue every 5 (five) minutes as needed for chest pain.   ondansetron 4 MG tablet Commonly known as: ZOFRAN Take 1 tablet (4 mg total) by mouth every 8 (eight) hours as needed for nausea or vomiting.   predniSONE 5 MG tablet Commonly known as: DELTASONE Take 5 mg by mouth daily with breakfast.   sodium bicarbonate  650 MG tablet Take 1,300 mg by mouth 2 (two) times daily.   sulfamethoxazole-trimethoprim 400-80 MG tablet Commonly known as: BACTRIM Take 1 tablet by mouth every Monday, Wednesday, and Friday.   tacrolimus 0.5 MG capsule Commonly known as: PROGRAF Take 1 mg by mouth in the morning and at bedtime.        Discharge Assessment: Vitals:   11/20/21 0122 11/20/21 0526  BP: (!) 122/51 (!) 129/53  Pulse: (!) 58 60  Resp:    Temp: 98.4 F (36.9 C) 98.2 F (36.8 C)  SpO2: 92% 100%   Skin clean, dry and intact without evidence of skin break down, no evidence of skin tears noted. IV catheter  discontinued intact. Site without signs and symptoms of complications - no redness or edema noted at insertion site, patient denies c/o pain - only slight tenderness at site.  Dressing with slight pressure applied.  D/c Instructions-Education: Discharge instructions given to patient/family with verbalized understanding. D/c education completed with patient/family including follow up instructions, medication list, d/c activities limitations if indicated, with other d/c instructions as indicated by MD - patient able to verbalize understanding, all questions fully answered. Patient instructed to return to ED, call 911, or call MD for any changes in condition.  Patient escorted via [place], and D/C home via private auto.  Kathie Rhodes, RN 11/20/2021 10:30 AM

## 2021-11-20 NOTE — Discharge Summary (Signed)
Physician Discharge Summary   Patient: Daniel Myers MRN: [id number] DOB: Jan 04, 1946  Admit date:     11/16/2021  Discharge date: 11/20/21  Discharge Physician: Deatra James   PCP: Claretta Fraise, MD   Recommendations at discharge:  Follow with cardiology within 1 week Follow-up with PCP in 1-2 weeks Follow-up with outpatient palliative care team Continue current recommended medication by cardiology  Discharge Diagnoses: Principal Problem:   NSTEMI (non-ST elevated myocardial infarction) [facility]) Active Problems:   CAD (coronary artery disease)/Prior MI   Insulin dependent type 2 diabetes mellitus ([place])   Renal transplant recipient   CKD (chronic kidney disease) stage 4, GFR 15-29 ml/min (HCC)   Persistent atrial fibrillation (HCC)   Obesity (BMI 30-39.9)   Essential hypertension   Mixed hyperlipidemia   Acute on chronic combined systolic and diastolic CHF (congestive heart failure) (HCC)   Hyperkalemia   Goals of care, counseling/discussion   DNR (do not resuscitate)  Resolved Problems:   * No resolved hospital problems. *  Hospital Course: Daniel Myers is a 76 y.o. male with medical history significant for  CAD (s/p cath in 09/2020 showing severe 3-vessel CAD and patient declined CABG and could possibly intervene on LAD but would be CTO and high-risk, therefore medical management recommended), HFrEF (EF 30-35% in 10/2021), paroxysmal atrial fibrillation (s/p DCCV in 10/2020 and has been continued on Amiodarone), HTN, HLD, Stage 3 CKD and history of CVA, renal transplant admitted on 11/16/2021 with NSTEMI in the setting of chest pain and dyspnea     1)NSTEMI/CAD-prior cardiac cath consistent with severe three-vessel CAD--  -s/p cath in 09/2020 showing severe 3-vessel CAD and patient declined CABG and could possibly intervene on LAD but would be CTO and high-risk, therefore medical management recommended) -No further chest pains -Troponin 672>>1,331>> 5,874 patient  previously deemed not to be a candidate for PCI, patient previously declined CABG  -Patient and daughter declined transfer to [facility] for possible LHC and intervention -Cardiology consulted and continue to follow closely: Completed 48 hours of heparin gtt. Continue ASA '81mg'$  daily, Atorvastatin '40mg'$  daily, Zetia '10mg'$  daily and Toprol-XL '100mg'$  daily, Eliquis  -Please note that patient was not on aspirin PTA LDL 34, HDL 27, T chol 72----[place] recommended to continue after statin -Per cardiologist okay to stop IV heparin and PTA Eliquis  2)PAFib----s/p DCCV in 10/2020  -Echo from 11/17/2021 showed low EF and biatrial enlargement, no aortic stenosis and No mitral stenosis -Continue metoprolol and Amiodarone  3)S/p  prior renal transplant with CKD 4--with recurrent hyperkalemia and metabolic acidosis -Suspect that Bactrim use is contributory to patient's recurrent hyperkalemia -Continue bicarb -Continue Lokelma---potassium is down to 5.7 from 6.4 -Creatinine trending up a bit---nephrology consult requested especially given need for diuresis at this time -Continue tacrolimus, CellCept, prednisone and Bactrim -Renally adjust medications, avoid nephrotoxic agents / dehydration  / hypotension -Discontinue spironolactone and lisinopril   4) HFrEF--patient with history of combined diastolic and systolic dysfunction CHF -Echo from 11/17/2021 shows EF is down to 20 to 25% with global hypokinesis of the left ventricle and grade 3 diastolic dysfunction -EF was 30 to 35% back in September 2023  -Clinically appears to have some CHF distribution at this time -BNP trending up, -Per cardiologist okay to give IV diuretics--nephrologist was consulted, 2 dose of Lasix 80 mg was given on 10/11, and 10/12--resuming oral Lasix Toprol-XL '100mg'$  daily. restart Sinda Du   5)DM2-A1c 6.2 reflecting excellent diabetic control PTA  6) chronic anemia--- suspect related to  CKD Monitor closely and  consider Procrit/ESA -No bleeding concerns at this time  7) recent COVID-19 infection and URI----supportive and symptomatic treatment advised  8)Generalized weakness and falls--- history of rib fracture on 10/29/2021 fall at home -Anticipate patient will need home health PT postdischarge  9)Social/Ethics--plan of care discussed with patient and patient's daughter at [facility] -Patient is a DNR/DNI without limitations to treatment      Disposition: The patient is from: Home              Anticipated d/c is to: Home with [facility]      Consultants: Cardiology Procedures performed: Echocardiogram Disposition: Home health Diet recommendation:  Discharge Diet Orders (From admission, onward)     Start     Ordered   11/20/21 0000  Diet - low sodium heart healthy        11/20/21 1023           Cardiac and Carb modified diet DISCHARGE MEDICATION: Allergies as of 11/20/2021       Reactions   Tape Rash   Use paper tape.    Trazodone And Nefazodone Palpitations   tachycardia   Mirtazapine    imbalance   Elemental Sulfur Rash   Sulfa Antibiotics Rash        Medication List     STOP taking these medications    HYDROcodone-acetaminophen 5-325 MG tablet Commonly known as: NORCO/VICODIN   lisinopril 5 MG tablet Commonly known as: ZESTRIL   ReliOn Insulin Syringe 31G X 15/64" 0.5 ML Misc Generic drug: Insulin Syringe-Needle U-100   spironolactone 25 MG tablet Commonly known as: ALDACTONE       TAKE these medications    acetaminophen 500 MG tablet Commonly known as: TYLENOL Take 500 mg by mouth every 8 (eight) hours as needed for moderate pain.   albuterol 108 (90 Base) MCG/ACT inhaler Commonly known as: VENTOLIN HFA Inhale 2 puffs into the lungs every 6 (six) hours as needed for wheezing or shortness of breath.   ALPRAZolam 0.25 MG tablet Commonly known as: XANAX Take 1 tablet (0.25 mg total) by mouth at bedtime as needed for anxiety.   amiodarone 200 MG  tablet Commonly known as: PACERONE Take 0.5 tablets (100 mg total) by mouth daily.   atorvastatin 40 MG tablet Commonly known as: LIPITOR Take 1 tablet (40 mg total) by mouth daily.   dapagliflozin propanediol 10 MG Tabs tablet Commonly known as: Farxiga Take 1 tablet (10 mg total) by mouth daily before breakfast.   Eliquis 5 MG Tabs tablet Generic drug: apixaban Take 1 tablet by mouth twice daily   ezetimibe 10 MG tablet Commonly known as: Zetia Take 1 tablet (10 mg total) by mouth daily.   fluticasone 50 MCG/ACT nasal spray Commonly known as: FLONASE USE ONE SPRAY(S) IN EACH NOSTRIL ONCE DAILY   furosemide 20 MG tablet Commonly known as: LASIX Take 1 tablet (20 mg total) by mouth every other day.   insulin NPH Human 100 UNIT/ML injection Commonly known as: NOVOLIN N 10 units AC breakfast and 10 units AC supper   insulin regular 100 units/mL injection Commonly known as: NOVOLIN R 16 units w bkfst and 13 units w dinner   Lokelma 10 g Pack packet Generic drug: sodium zirconium cyclosilicate Take 10 g by mouth as directed. By the HF clinic What changed: additional instructions   loratadine 10 MG tablet Commonly known as: CLARITIN Take 10 mg by mouth daily as needed for allergies.   metoprolol succinate 100  MG 24 hr tablet Commonly known as: TOPROL-XL Take 1 tablet (100 mg total) by mouth daily. Take with or immediately following a meal.   mycophenolate 250 MG capsule Commonly known as: CELLCEPT Take 500 mg by mouth 2 (two) times daily.   nitroGLYCERIN 0.4 MG SL tablet Commonly known as: NITROSTAT Place 1 tablet (0.4 mg total) under the tongue every 5 (five) minutes as needed for chest pain.   ondansetron 4 MG tablet Commonly known as: ZOFRAN Take 1 tablet (4 mg total) by mouth every 8 (eight) hours as needed for nausea or vomiting.   predniSONE 5 MG tablet Commonly known as: DELTASONE Take 5 mg by mouth daily with breakfast.   sodium bicarbonate 650 MG  tablet Take 1,300 mg by mouth 2 (two) times daily.   sulfamethoxazole-trimethoprim 400-80 MG tablet Commonly known as: BACTRIM Take 1 tablet by mouth every Monday, Wednesday, and Friday.   tacrolimus 0.5 MG capsule Commonly known as: PROGRAF Take 1 mg by mouth in the morning and at bedtime.   Verquvo 2.5 MG Tabs Generic drug: Vericiguat Take 2.5 mg by mouth daily.        Discharge Exam: Filed Weights   11/16/21 1745 11/16/21 1920 11/19/21 0500  Weight: 95.8 kg 96.2 kg 96.3 kg      Physical Exam:   General:  AAO x 3,  cooperative, no distress;   HEENT:  Normocephalic, PERRL, otherwise with in Normal limits   Neuro:  CNII-XII intact. , normal motor and sensation, reflexes intact   Lungs:   Clear to auscultation BL, Respirations unlabored,  No wheezes / crackles  Cardio:    S1/S2, RRR, No murmure, No Rubs or Gallops   Abdomen:  Soft, non-tender, bowel sounds active all four quadrants, no guarding or peritoneal signs.  Muscular  skeletal:  Limited exam -global generalized weaknesses - in bed, able to move all 4 extremities,   2+ pulses,  symmetric, No pitting edema  Skin:  Dry, warm to touch, negative for any Rashes,  Wounds: Please see nursing documentation          Condition at discharge: fair  The results of significant diagnostics from this hospitalization (including imaging, microbiology, ancillary and laboratory) are listed below for reference.   Imaging Studies: US Renal Transplant w/Doppler  Result Date: 11/19/2021 CLINICAL DATA:  [id number] AKI (acute kidney injury) ([place]) [id number] EXAM: ULTRASOUND OF RENAL TRANSPLANT WITH RENAL DOPPLER ULTRASOUND TECHNIQUE: Ultrasound examination of the renal transplant was performed with gray-scale, color and duplex doppler evaluation. COMPARISON:  None Available. FINDINGS: Transplant kidney location: Right lower quadrant Transplant Kidney: Renal measurements: 10.4 x 7.1 x 6.3 cm = volume: 243.7m. No hydronephrosis. There is  a 3.3 cm simple appearing cyst in the upper pole. Color flow in the main renal artery:  Yes Color flow in the main renal vein:  Yes Duplex Doppler Evaluation: Main Renal Artery Velocity: 109.9 cm/sec Main Renal Artery Resistive Index: 0.83 Venous waveform in main renal vein:  Present Intrarenal resistive index in upper pole:  0.80 (normal 0.6-0.8; equivocal 0.8-0.9; abnormal >= 0.9) Intrarenal resistive index in lower pole: 0.61 (normal 0.6-0.8; equivocal 0.8-0.9; abnormal >= 0.9) Bladder: There is layering avascular material in the posterior bladder. Ureteral jets are not visualized. Other findings:  The native kidneys are atrophic. IMPRESSION: Layering avascular material in the posterior bladder could represent blood products or infectious debris, recommend correlation with urinalysis. Patent renal transplant vasculature, with mildly elevated resistive index in the upper pole, nonspecific. No hydronephrosis. Electronically Signed  By: Maurine Simmering M.D.   On: 11/19/2021 11:02   ECHOCARDIOGRAM COMPLETE  Result Date: 11/17/2021    ECHOCARDIOGRAM REPORT   Patient Name:   CARL BUTNER Date of Exam: 11/17/2021 Medical Rec #:  [id number]     Height:       70.0 in Accession #:    [id number]    Weight:       212.0 lb Date of Birth:  10/27/1945     BSA:          2.140 m Patient Age:    41 years      BP:           103/50 mmHg Patient Gender: M             HR:           56 bpm. Exam Location:  [facility] Procedure: 2D Echo, Color Doppler, Cardiac Doppler and Intracardiac            Opacification Agent Indications:    NSTEMI  History:        Patient has prior history of Echocardiogram examinations, most                 recent 11/01/2021. CAD; Risk Factors:Hypertension, Diabetes and                 Dyslipidemia.  Sonographer:    Raquel Sarna Senior RDCS Referring Phys: [id number] Erma Heritage  Sonographer Comments: Technically difficult due to lung interference. IMPRESSIONS  1. Left ventricular ejection fraction, by estimation,  is 20 to 25%. The left ventricle has severely decreased function. The left ventricle demonstrates global hypokinesis. Left ventricular diastolic parameters are consistent with Grade III diastolic dysfunction (restrictive).  2. Right ventricular systolic function is mildly reduced. The right ventricular size is normal.  3. Left atrial size was moderately dilated.  4. Right atrial size was moderately dilated.  5. The mitral valve is normal in structure. No evidence of mitral valve regurgitation. No evidence of mitral stenosis.  6. The aortic valve is tricuspid. Aortic valve regurgitation is not visualized. No aortic stenosis is present.  7. The inferior vena cava is normal in size with greater than 50% respiratory variability, suggesting right atrial pressure of 3 mmHg. Comparison(s): Prior images reviewed side by side. Prior EF described as 30-35%. FINDINGS  Left Ventricle: Left ventricular ejection fraction, by estimation, is 20 to 25%. The left ventricle has severely decreased function. The left ventricle demonstrates global hypokinesis. Definity contrast agent was given IV to delineate the left ventricular endocardial borders. The left ventricular internal cavity size was normal in size. There is no left ventricular hypertrophy. Left ventricular diastolic parameters are consistent with Grade III diastolic dysfunction (restrictive).  LV Wall Scoring: The apex is dyskinetic. The mid and distal anterior septum, apical lateral segment, and apical anterior segment are akinetic. Right Ventricle: The right ventricular size is normal. No increase in right ventricular wall thickness. Right ventricular systolic function is mildly reduced. Left Atrium: Left atrial size was moderately dilated. Right Atrium: Right atrial size was moderately dilated. Pericardium: There is no evidence of pericardial effusion. Mitral Valve: The mitral valve is normal in structure. No evidence of mitral valve regurgitation. No evidence of mitral  valve stenosis. Tricuspid Valve: The tricuspid valve is normal in structure. Tricuspid valve regurgitation is not demonstrated. No evidence of tricuspid stenosis. Aortic Valve: The aortic valve is tricuspid. Aortic valve regurgitation is not visualized. No aortic stenosis is present. Pulmonic Valve:  The pulmonic valve was normal in structure. Pulmonic valve regurgitation is not visualized. No evidence of pulmonic stenosis. Aorta: The aortic root is normal in size and structure. Venous: The inferior vena cava is normal in size with greater than 50% respiratory variability, suggesting right atrial pressure of 3 mmHg. IAS/Shunts: No atrial level shunt detected by color flow Doppler.  LEFT VENTRICLE PLAX 2D LVIDd:         5.80 cm      Diastology LVIDs:         4.80 cm      LV e' medial:    3.92 cm/s LV PW:         1.20 cm      LV E/e' medial:  20.8 LV IVS:        1.10 cm      LV e' lateral:   5.66 cm/s LVOT diam:     2.20 cm      LV E/e' lateral: 14.4 LV SV:         67 LV SV Index:   31 LVOT Area:     3.80 cm  LV Volumes (MOD) LV vol d, MOD A4C: 201.0 ml LV vol s, MOD A4C: 154.0 ml LV SV MOD A4C:     201.0 ml RIGHT VENTRICLE RV S prime:     10.80 cm/s TAPSE (M-mode): 2.4 cm LEFT ATRIUM              Index        RIGHT ATRIUM           Index LA diam:        5.40 cm  2.52 cm/m   RA Area:     21.00 cm LA Vol (A2C):   118.6 ml 55.43 ml/m  RA Volume:   61.40 ml  28.70 ml/m LA Vol (A4C):   93.2 ml  43.56 ml/m LA Biplane Vol: 94.2 ml  44.03 ml/m  AORTIC VALVE LVOT Vmax:   78.30 cm/s LVOT Vmean:  56.700 cm/s LVOT VTI:    0.176 m  AORTA Ao Root diam: 3.60 cm Ao Asc diam:  3.20 cm MITRAL VALVE MV Area (PHT): 2.70 cm    SHUNTS MV Decel Time: 281 msec    Systemic VTI:  0.18 m MV E velocity: 81.70 cm/s  Systemic Diam: 2.20 cm MV A velocity: 58.80 cm/s MV E/A ratio:  1.39 Candee Furbish MD Electronically signed by Candee Furbish MD Signature Date/Time: 11/17/2021/10:40:25 AM    Final    DG Chest 2 View  Result Date:  11/16/2021 CLINICAL DATA:  Chest pain, shortness of breath EXAM: CHEST - 2 VIEW COMPARISON:  10/29/2021 FINDINGS: Mild left lower lobe/retrocardiac opacity, atelectasis versus pneumonia. Mild right basilar opacity, likely atelectasis. No frank interstitial edema. No pleural effusion or pneumothorax. Mild cardiomegaly. IMPRESSION: Mild bilateral lower lobe opacities, atelectasis versus pneumonia. Mild cardiomegaly.  No frank interstitial edema. Electronically Signed   By: Julian Hy M.D.   On: 11/16/2021 19:47   ECHOCARDIOGRAM COMPLETE  Result Date: 11/01/2021    ECHOCARDIOGRAM REPORT   Patient Name:   ADIT RIDDLES Date of Exam: 10/27/2021 Medical Rec #:  [id number]     Height:       70.0 in Accession #:    [id number]    Weight:       211.6 lb Date of Birth:  09-Aug-1945     BSA:          2.138 m Patient Age:  76 years      BP:           102/50 mmHg Patient Gender: M             HR:           45 bpm. Exam Location:  [facility] Procedure: 2D Echo, 3D Echo, Color Doppler and Cardiac Doppler Indications:    I50.9 CHF  History:        Patient has prior history of Echocardiogram examinations, most                 recent 06/05/2020. CHF, CAD and Previous Myocardial Infarction,                 Stroke, Arrythmias:Atrial Fibrillation; Risk Factors:Current                 Smoker, Dyslipidemia, Diabetes and Hypertension. Respiratory                 Failure with Hypoxia secondary to COVID-19 Pneumonia (January                 2022) with NSTEMI, Afib with RVR, History of Renal Transplant.  Sonographer:    Deliah Boston RDCS Referring Phys: [place] Comments: Technically difficult study due to poor echo windows. IMPRESSIONS  1. Left ventricular ejection fraction, by estimation, is 30 to 35%. The left ventricle has moderately decreased function. Left ventricular endocardial border not optimally defined to evaluate regional wall motion. The left ventricular internal cavity size was mildly  dilated. There is mild left ventricular hypertrophy. Left ventricular diastolic parameters are consistent with Grade II diastolic dysfunction (pseudonormalization).  2. Right ventricular systolic function is mildly reduced. The right ventricular size is mildly enlarged. Tricuspid regurgitation signal is inadequate for assessing PA pressure.  3. Left atrial size was mildly dilated.  4. The mitral valve is normal in structure. Mild mitral valve regurgitation. No evidence of mitral stenosis.  5. The aortic valve is grossly normal. There is mild calcification of the aortic valve. Aortic valve regurgitation is trivial. No aortic stenosis is present.  6. The inferior vena cava is normal in size with greater than 50% respiratory variability, suggesting right atrial pressure of 3 mmHg. FINDINGS  Left Ventricle: Left ventricular ejection fraction, by estimation, is 30 to 35%. The left ventricle has moderately decreased function. Left ventricular endocardial border not optimally defined to evaluate regional wall motion. 3D left ventricular ejection fraction analysis performed but not reported based on interpreter judgement due to suboptimal tracking. The left ventricular internal cavity size was mildly dilated. There is mild left ventricular hypertrophy. Left ventricular diastolic parameters are consistent with Grade II diastolic dysfunction (pseudonormalization). Right Ventricle: The right ventricular size is mildly enlarged. No increase in right ventricular wall thickness. Right ventricular systolic function is mildly reduced. Tricuspid regurgitation signal is inadequate for assessing PA pressure. Left Atrium: Left atrial size was mildly dilated. Right Atrium: Right atrial size was normal in size. Pericardium: There is no evidence of pericardial effusion. Mitral Valve: The mitral valve is normal in structure. Mild mitral valve regurgitation. No evidence of mitral valve stenosis. Tricuspid Valve: The tricuspid valve is  normal in structure. Tricuspid valve regurgitation is trivial. No evidence of tricuspid stenosis. Aortic Valve: The aortic valve is grossly normal. There is mild calcification of the aortic valve. Aortic valve regurgitation is trivial. Aortic regurgitation PHT measures 683 msec. No aortic stenosis is present. Pulmonic Valve: The pulmonic valve was grossly  normal. Pulmonic valve regurgitation is trivial. No evidence of pulmonic stenosis. Aorta: The aortic root is normal in size and structure. Venous: The inferior vena cava was not well visualized. The inferior vena cava is normal in size with greater than 50% respiratory variability, suggesting right atrial pressure of 3 mmHg. IAS/Shunts: No atrial level shunt detected by color flow Doppler.  LEFT VENTRICLE PLAX 2D LVIDd:         5.80 cm   Diastology LVIDs:         4.50 cm   LV e' medial:    3.32 cm/s LV PW:         0.90 cm   LV E/e' medial:  24.9 LV IVS:        1.20 cm   LV e' lateral:   7.46 cm/s LVOT diam:     2.50 cm   LV E/e' lateral: 11.1 LV SV:         100 LV SV Index:   47 LVOT Area:     4.91 cm                           3D Volume EF:                          3D EF:        29 %                          LV EDV:       210 ml                          LV ESV:       149 ml                          LV SV:        61 ml RIGHT VENTRICLE RV Basal diam:  3.80 cm RV S prime:     14.90 cm/s TAPSE (M-mode): 2.3 cm LEFT ATRIUM            Index        RIGHT ATRIUM           Index LA diam:      5.90 cm  2.76 cm/m   RA Area:     20.30 cm LA Vol (A2C): 80.1 ml  37.47 ml/m  RA Volume:   56.10 ml  26.24 ml/m LA Vol (A4C): 141.0 ml 65.95 ml/m  AORTIC VALVE LVOT Vmax:   86.15 cm/s LVOT Vmean:  57.100 cm/s LVOT VTI:    0.204 m AI PHT:      683 msec  AORTA Ao Root diam: 3.50 cm Ao Asc diam:  3.50 cm MITRAL VALVE MV Area (PHT): cm         SHUNTS MV Decel Time: 546 msec    Systemic VTI:  0.20 m MV E velocity: 82.83 cm/s  Systemic Diam: 2.50 cm MV A velocity: 49.43 cm/s MV E/A  ratio:  1.68 Cherlynn Kaiser MD Electronically signed by Cherlynn Kaiser MD Signature Date/Time: 11/01/2021/1:19:25 PM    Final    DG Hip Unilat W or Wo Pelvis 2-3 Views Left  Result Date: 10/29/2021 CLINICAL DATA:  [place], pain EXAM: DG HIP (WITH OR WITHOUT PELVIS) 2-3V LEFT COMPARISON:  03/11/2020 FINDINGS: Frontal view of the  pelvis as well as frontal and frogleg lateral views of the left hip are obtained. Stable bilateral hip osteoarthritis. No fracture, subluxation, or dislocation. The remainder of the bony pelvis is unremarkable. Sacroiliac joints are unremarkable. IMPRESSION: 1. No acute displaced fracture. 2. Stable bilateral hip osteoarthritis. Electronically Signed   By: Randa Ngo M.D.   On: 10/29/2021 20:08   DG Elbow Complete Left  Result Date: 10/29/2021 CLINICAL DATA:  [place], pain, contusion EXAM: LEFT ELBOW - COMPLETE 3+ VIEW COMPARISON:  None Available. FINDINGS: Frontal, bilateral oblique, lateral views of the left elbow are obtained. Evaluation is limited by suboptimal patient positioning. No acute displaced fracture, subluxation, or dislocation. Joint spaces are relatively well preserved. No evidence of joint effusion. Soft tissue swelling overlying the olecranon. IMPRESSION: 1. Dorsal soft tissue swelling over the olecranon. 2. No acute fracture. Electronically Signed   By: Randa Ngo M.D.   On: 10/29/2021 20:07   DG Knee Complete 4 Views Right  Result Date: 10/29/2021 CLINICAL DATA:  [place], pain EXAM: RIGHT KNEE - COMPLETE 4+ VIEW; LEFT KNEE - COMPLETE 4+ VIEW COMPARISON:  None Available. FINDINGS: Right knee: Frontal, oblique, and lateral views of the right knee are obtained. No fracture, subluxation, or dislocation. Joint spaces are relatively well preserved. No joint effusion. Soft tissues are unremarkable. Left knee: Frontal, oblique, lateral views are obtained. No fracture, subluxation, or dislocation. Joint spaces are well preserved. Mild patellar spurring, with prominent  these a pathic changes along the lower pole. Prepatellar soft tissue swelling. No joint effusion. IMPRESSION: 1. Unremarkable right knee. 2. Left prepatellar soft tissue swelling. Mild left patellofemoral compartmental osteoarthritis. No acute fracture. Electronically Signed   By: Randa Ngo M.D.   On: 10/29/2021 20:06   DG Knee Complete 4 Views Left  Result Date: 10/29/2021 CLINICAL DATA:  [place], pain EXAM: RIGHT KNEE - COMPLETE 4+ VIEW; LEFT KNEE - COMPLETE 4+ VIEW COMPARISON:  None Available. FINDINGS: Right knee: Frontal, oblique, and lateral views of the right knee are obtained. No fracture, subluxation, or dislocation. Joint spaces are relatively well preserved. No joint effusion. Soft tissues are unremarkable. Left knee: Frontal, oblique, lateral views are obtained. No fracture, subluxation, or dislocation. Joint spaces are well preserved. Mild patellar spurring, with prominent these a pathic changes along the lower pole. Prepatellar soft tissue swelling. No joint effusion. IMPRESSION: 1. Unremarkable right knee. 2. Left prepatellar soft tissue swelling. Mild left patellofemoral compartmental osteoarthritis. No acute fracture. Electronically Signed   By: Randa Ngo M.D.   On: 10/29/2021 20:06   DG Shoulder Left  Result Date: 10/29/2021 CLINICAL DATA:  [place], left arm pain EXAM: LEFT SHOULDER - 2+ VIEW COMPARISON:  03/11/2020 FINDINGS: Frontal and transscapular views of the left shoulder are obtained. No fracture, subluxation, or dislocation. Stable glenohumeral and acromioclavicular joint osteoarthritis. Soft tissues are unremarkable. Minimally displaced posterior left sixth and seventh rib fractures are better visualized on corresponding chest x-ray. IMPRESSION: 1. Left shoulder osteoarthritis. 2. Minimally displaced left posterior sixth and seventh rib fractures. Electronically Signed   By: Randa Ngo M.D.   On: 10/29/2021 20:04   DG Chest 2 View  Result Date: 10/29/2021 CLINICAL DATA:   [place] EXAM: CHEST - 2 VIEW COMPARISON:  05/22/2020 FINDINGS: Frontal and lateral views of the chest demonstrate a stable enlarged cardiac silhouette. No acute airspace disease, effusion, or pneumothorax. Acute displaced left posterior sixth and seventh rib fracture. No other acute bony abnormalities. IMPRESSION: 1. Displaced left posterior sixth and seventh rib fractures. 2. Otherwise no  acute intrathoracic process. Electronically Signed   By: Randa Ngo M.D.   On: 10/29/2021 20:03    Microbiology: Results for orders placed or performed during the hospital encounter of 11/16/21  SARS Coronavirus 2 by RT PCR (hospital order, performed in [facility] hospital lab) *cepheid single result test* Anterior Nasal Swab     Status: None   Collection Time: 11/16/21 10:10 PM   Specimen: Anterior Nasal Swab  Result Value Ref Range Status   SARS Coronavirus 2 by RT PCR NEGATIVE NEGATIVE Final    Comment: (NOTE) SARS-CoV-2 target nucleic acids are NOT DETECTED.  The SARS-CoV-2 RNA is generally detectable in upper and lower respiratory specimens during the acute phase of infection. The lowest concentration of SARS-CoV-2 viral copies this assay can detect is 250 copies / mL. A negative result does not preclude SARS-CoV-2 infection and should not be used as the sole basis for treatment or other patient management decisions.  A negative result may occur with improper specimen collection / handling, submission of specimen other than nasopharyngeal swab, presence of viral mutation(s) within the areas targeted by this assay, and inadequate number of viral copies (<250 copies / mL). A negative result must be combined with clinical observations, patient history, and epidemiological information.  Fact Sheet for Patients:   https://www.patel.info/  Fact Sheet for Healthcare Providers: https://hall.com/  This test is not yet approved or  cleared by the [place]  FDA and has been authorized for detection and/or diagnosis of SARS-CoV-2 by FDA under an Emergency Use Authorization (EUA).  This EUA will remain in effect (meaning this test can be used) for the duration of the COVID-19 declaration under Section 564(b)(1) of the Act, 21 U.S.C. section 360bbb-3(b)(1), unless the authorization is terminated or revoked sooner.  Performed at [facility], [address] [place], [place] [address]     Labs: CBC: Recent Labs  Lab 11/16/21 1910 11/16/21 2016 11/17/21 0500 11/18/21 0346 11/19/21 0420 11/20/21 0509  WBC 5.3  --  3.1* 3.4* 5.5 7.1  HGB 9.9* 9.2* 8.7* 8.9* 8.4* 8.2*  HCT 32.0* 27.0* 29.1* 29.4* 27.1* 27.1*  MCV 92.2  --  94.2 93.0 92.2 93.1  PLT 153  --  151 148* 131* 270*   Basic Metabolic Panel: Recent Labs  Lab 11/17/21 0500 11/17/21 1721 11/18/21 0346 11/19/21 0420 11/20/21 0416  NA 134* 133* 140 135 133*  K 5.7* 5.7* 6.2* 4.6 4.3  CL 109 106 112* 107 103  CO2 19* 21* 21* 23 23  GLUCOSE 141* 139* 118* 123* 133*  BUN 40* 42* 42* 40* 47*  CREATININE 1.94* 2.06* 2.18* 2.18* 2.51*  CALCIUM 7.8* 7.7* 8.0* 7.7* 7.6*   Liver Function Tests: Recent Labs  Lab 11/19/21 0420  AST 18  ALT 13  ALKPHOS 116  BILITOT 0.8  PROT 5.1*  ALBUMIN 2.7*   CBG: Recent Labs  Lab 11/19/21 1117 11/19/21 1653 11/19/21 2056 11/20/21 0118 11/20/21 0723  GLUCAP 193* 219* 224* 170* 123*    Discharge time spent: greater than 30 minutes.  Signed: Deatra James, MD [facility] 11/20/2021

## 2021-11-20 NOTE — Care Management Important Message (Signed)
Important Message  Patient Details  Name: Daniel Myers MRN: [id number] Date of Birth: 11/25/1945   Medicare Important Message Given:  Yes     Tommy Medal 11/20/2021, 12:46 PM

## 2021-11-21 ENCOUNTER — Other Ambulatory Visit: Payer: Self-pay | Admitting: Family Medicine

## 2021-11-21 ENCOUNTER — Encounter (HOSPITAL_COMMUNITY): Payer: Self-pay

## 2021-11-21 MED ORDER — LEVOFLOXACIN 500 MG PO TABS: 500.0000 mg | ORAL_TABLET | Freq: Every day | ORAL | 0 refills | Status: DC

## 2021-11-22 ENCOUNTER — Other Ambulatory Visit: Payer: Self-pay | Admitting: Family Medicine

## 2021-11-22 LAB — URINE CULTURE: Culture: >=100000 — AB

## 2021-11-22 LAB — TACROLIMUS LEVEL: Tacrolimus (FK506) - LabCorp: 3.6 ng/mL (ref 2.0–20.0)

## 2021-11-22 MED ORDER — AMOXICILLIN-POT CLAVULANATE 875-125 MG PO TABS: 1.0000 | ORAL_TABLET | Freq: Two times a day (BID) | ORAL | 0 refills | Status: DC

## 2021-11-23 ENCOUNTER — Telehealth: Payer: Self-pay | Admitting: *Deleted

## 2021-11-23 ENCOUNTER — Encounter: Payer: Self-pay | Admitting: *Deleted

## 2021-11-23 ENCOUNTER — Telehealth (HOSPITAL_COMMUNITY): Payer: Self-pay | Admitting: Pharmacy Technician

## 2021-11-23 ENCOUNTER — Other Ambulatory Visit (HOSPITAL_COMMUNITY): Payer: Self-pay

## 2021-11-23 ENCOUNTER — Encounter: Payer: Self-pay | Admitting: Family Medicine

## 2021-11-23 NOTE — Telephone Encounter (Signed)
Patient Advocate Encounter   Received notification from [place] [facility] that prior authorization for Daniel Myers is required.   PA submitted on CoverMyMeds Key [id number] Status is pending   Will continue to follow.

## 2021-11-23 NOTE — Patient Outreach (Signed)
11/23/2021  I spoke with patient's daughter, [name], this morning for a hospital follow-up TOC call. Levaquin was discontinued and Augmentin was sent in to the pharmacy. They have both antibiotics. Daughter questioned if he needed both.  I reviewed the urine culture results and the bacteria was resistant to Levaquin so explained that was likely the reason for the change. My concern is that it is also resistant to ampicillin but sensitive to Ampicillin/Sulbactam. I wasn't sure if Augmentin would be ineffective because of the resistance to Ampicillin or if it is similar enough to Ampicillin/Sulbactam to be effective.   Forwarding to Dr Livia Snellen for review and response. I will reach back out to patient's daughter once I have a clear understanding.   [name], BSN, RN-BC RN Care Coordinator [place] Direct Dial: [phone] Main #: [phone]

## 2021-11-23 NOTE — Patient Outreach (Signed)
  Care Coordination TOC Note Transition Care Management Follow-up Telephone Call Date of discharge and from where: [facility] 11/20/21 How have you been since you were released from the hospital? "He feels really miserable" Any questions or concerns? Yes. Dr Livia Snellen sent in a prescription for augmentin but they weren't given a reason or any information on whether he should stop Levaquin that was prescribed at discharge.   Items Reviewed: Did the pt receive and understand the discharge instructions provided? Yes  Medications obtained and verified? Yes  Other? Yes . Discussed appetite. Appetite is poor but family is providing meals and supplement drinks. Asked if he has any fluid or protein restrictions from nephrologist. They are not aware of any. Provided verbal education on his symptom of fatigue. Likely related to heart attack and UTI infection. Verified that palliative care has reached out and they will be calling this week to schedule a follow-up Any new allergies since your discharge? No  Dietary orders reviewed? Yes Do you have support at home? Yes   Home Care and Equipment/Supplies: Were home health services ordered? yes If so, what is the name of the agency? [facility]  Has the agency set up a time to come to the patient's home? yes Were any new equipment or medical supplies ordered?  No What is the name of the medical supply agency? N/a Were you able to get the supplies/equipment? not applicable Do you have any questions related to the use of the equipment or supplies? No  Functional Questionnaire: (I = Independent and D = Dependent) ADLs: D  Bathing/Dressing- D  Meal Prep- D  Eating- I  Maintaining continence- I  Transferring/Ambulation- I with walker  Managing Meds- D  Follow up appointments reviewed:  PCP Hospital f/u appt confirmed? Yes  Scheduled to see Dr Livia Snellen on 12/02/21 @ 9:25. [facility] f/u appt confirmed? Yes  Bernerd Pho, PA-C (cardio) 11/24/21  at [employer] Are transportation arrangements needed? No  If their condition worsens, is the pt aware to call PCP or go to the Emergency Dept.? Yes Was the patient provided with contact information for the PCP's office or ED? Yes Was to pt encouraged to call back with questions or concerns? Yes  SDOH assessments and interventions completed:   Yes  Care Coordination Interventions Activated:  Yes   Care Coordination Interventions:  PCP follow up appointment requested Referred for Care Coordination Services:  [place] with [facility] to schedule cardiology f/u and collaborated with [facility] Triage to schedule PCP appt     Collaborated with Dr Livia Snellen regarding antibiotic for UTI coverage since the bacteria is resistant to levaguin   Encounter Outcome:  Pt. Visit Completed    [name], BSN, RN-BC RN Care Coordinator [place] Direct Dial: [phone] Main #: [phone]

## 2021-11-24 ENCOUNTER — Other Ambulatory Visit (HOSPITAL_COMMUNITY): Payer: Self-pay

## 2021-11-24 ENCOUNTER — Ambulatory Visit: Payer: Medicare Other | Admitting: Student

## 2021-11-24 NOTE — Telephone Encounter (Signed)
Advanced Heart Failure Patient Advocate Encounter  Prior Authorization for [place] has been approved.    Effective dates: 11/23/21 through 11/24/22  Patients co-pay is $32.50 (30 days)  Messaged patient with update.  Charlann Boxer, CPhT

## 2021-11-25 ENCOUNTER — Encounter (HOSPITAL_COMMUNITY): Payer: Self-pay

## 2021-11-27 ENCOUNTER — Encounter (HOSPITAL_COMMUNITY): Payer: Self-pay

## 2021-11-27 ENCOUNTER — Other Ambulatory Visit (HOSPITAL_COMMUNITY): Payer: Self-pay

## 2021-11-27 DIAGNOSIS — I5042 Chronic combined systolic (congestive) and diastolic (congestive) heart failure: Secondary | ICD-10-CM

## 2021-11-27 NOTE — Progress Notes (Signed)
Orders Placed This Encounter  Procedures   Basic metabolic panel    Standing Status:   Future    Standing Expiration Date:   11/28/2022    Order Specific Question:   Release to patient    Answer:   Immediate

## 2021-11-28 ENCOUNTER — Other Ambulatory Visit (HOSPITAL_COMMUNITY): Payer: Self-pay | Admitting: Cardiology

## 2021-11-30 ENCOUNTER — Telehealth (HOSPITAL_COMMUNITY): Payer: Self-pay | Admitting: Cardiology

## 2021-11-30 ENCOUNTER — Encounter (HOSPITAL_COMMUNITY): Payer: Self-pay

## 2021-11-30 ENCOUNTER — Other Ambulatory Visit (HOSPITAL_COMMUNITY): Payer: Medicare Other

## 2021-11-30 MED ORDER — FUROSEMIDE 20 MG PO TABS: 40.0000 mg | ORAL_TABLET | Freq: Two times a day (BID) | ORAL | 6 refills | Status: DC

## 2021-11-30 MED ORDER — POTASSIUM CHLORIDE CRYS ER 20 MEQ PO TBCR: 20.0000 meq | EXTENDED_RELEASE_TABLET | Freq: Every day | ORAL | 3 refills | Status: DC

## 2021-11-30 NOTE — Telephone Encounter (Signed)
Pts daughter are of medication changes  Will have labs done with [facility]  -verbal given  to Clarise Cruz [phone] -will evaluate for unna boots at home visit otherwise will re educate for leg elevation or ted hose

## 2021-11-30 NOTE — Telephone Encounter (Signed)
HHRN called to report vitals during home visit  Bp 110/54 weight 220 HR 62 Resp 16 O2-97% on room air Mild wheezing-crackles in lower lobes Bilaterally 3+pitting edema noted  Family reports pt has ted hoses-HHRN advised to place daily

## 2021-11-30 NOTE — Telephone Encounter (Signed)
Patients daughter called with concerns regarding weight increase Reports weight was 209 on 10/15. Weight today 10/23 220  Pts only complaint is BLE and abdominal swelling. Denies SOB, cough, CP etc. No mention of compression stockings  Was discharged with lasix 20 every other day however was increased to 40 daily 11/27/21 by Dr Aundra Dubin  Scheduled for labs today  Please advise further

## 2021-12-01 ENCOUNTER — Other Ambulatory Visit (HOSPITAL_COMMUNITY): Payer: Self-pay | Admitting: *Deleted

## 2021-12-01 MED ORDER — VERQUVO 2.5 MG PO TABS: 2.5000 mg | ORAL_TABLET | Freq: Every day | ORAL | 6 refills | Status: AC

## 2021-12-02 ENCOUNTER — Encounter: Payer: Self-pay | Admitting: Family Medicine

## 2021-12-02 ENCOUNTER — Ambulatory Visit (INDEPENDENT_AMBULATORY_CARE_PROVIDER_SITE_OTHER): Payer: Medicare Other | Admitting: Family Medicine

## 2021-12-02 VITALS — BP 118/62 | HR 64 | Temp 97.8°F | Ht 70.0 in | Wt 221.6 lb

## 2021-12-02 DIAGNOSIS — I5043 Acute on chronic combined systolic (congestive) and diastolic (congestive) heart failure: Secondary | ICD-10-CM

## 2021-12-02 DIAGNOSIS — E119 Type 2 diabetes mellitus without complications: Secondary | ICD-10-CM

## 2021-12-02 DIAGNOSIS — Z794 Long term (current) use of insulin: Secondary | ICD-10-CM

## 2021-12-02 DIAGNOSIS — I214 Non-ST elevation (NSTEMI) myocardial infarction: Secondary | ICD-10-CM | POA: Diagnosis not present

## 2021-12-02 DIAGNOSIS — Z940 Kidney transplant status: Secondary | ICD-10-CM

## 2021-12-02 DIAGNOSIS — I4819 Other persistent atrial fibrillation: Secondary | ICD-10-CM

## 2021-12-02 DIAGNOSIS — N184 Chronic kidney disease, stage 4 (severe): Secondary | ICD-10-CM

## 2021-12-02 MED ORDER — SPIRONOLACTONE 25 MG PO TABS: 25.0000 mg | ORAL_TABLET | Freq: Every day | ORAL | 3 refills | Status: DC

## 2021-12-02 NOTE — Progress Notes (Signed)
Subjective:  Patient ID: Daniel Myers, male    DOB: July 12, 1945  Age: 76 y.o. MRN: [id number]  CC: Hospitalization Follow-up   HPI Daniel Myers presents for hospitalization follow up. Had MI on 10/9. Admitted to [facility] Dx was NSTEMI. Declined CABG due to risk. DCed on 11/20/2021. Now following with cardiology. Getting home Pt for cardiac rehab. Not feeling good yet. Walks with a walker. Swelling through the day. SOme dyspnea.      12/02/2021    9:32 AM 11/10/2021    9:56 AM 11/05/2021   10:57 AM  Depression screen PHQ 2/9  Decreased Interest 0 0 0  Down, Depressed, Hopeless 0 0 0  PHQ - 2 Score 0 0 0  Altered sleeping   0  Tired, decreased energy   0  Change in appetite   0  Feeling bad or failure about yourself    0  Trouble concentrating   0  Moving slowly or fidgety/restless   0  Suicidal thoughts   0  PHQ-9 Score   0  Difficult doing work/chores   Not difficult at all    History Daniel Myers has a past medical history of Basal cell carcinoma (06/27/2013), Basal cell carcinoma (07/01/2014), Basal cell carcinoma (10/10/2014), Basal cell carcinoma (02/11/2015), Basal cell carcinoma (06/14/2016), Basal cell carcinoma (02/22/2017), Basal cell carcinoma (04/11/2018), Chronic kidney disease, History of renal transplant, Hyperlipidemia, Hypertension, NSTEMI (non-ST elevated myocardial infarction) ([place]) (02/13/2020), 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), 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), 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), 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), 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), 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), 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), Squamous cell carcinoma of skin (11/08/2011), Squamous cell carcinoma of skin (11/08/2011), Squamous cell carcinoma of skin (11/08/2011), 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), 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),  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), 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), 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), 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), 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), 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), Squamous cell carcinoma of skin (09/30/2015), Squamous cell carcinoma of skin (02/22/2017), Squamous cell carcinoma of skin (02/22/2017), Squamous cell carcinoma of skin (04/11/2018), Squamous cell carcinoma of skin (04/11/2018), Squamous cell carcinoma of skin (04/11/2018), Squamous cell carcinoma of skin (04/11/2018), Squamous cell carcinoma of skin (04/11/2018), Squamous cell carcinoma of skin (09/28/2018), Squamous cell carcinoma of skin (09/28/2018), Squamous cell carcinoma of skin (09/28/2018), Squamous cell carcinoma of skin (03/21/2019), Squamous cell carcinoma of skin (03/21/2019), 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   He has a past surgical history that includes Kidney transplant (Right); RIGHT/LEFT HEART CATH AND CORONARY ANGIOGRAPHY (N/A, 09/09/2020); and Cardioversion (N/A, 10/16/2020).   His family history includes Clotting disorder in his mother; Diabetes in his sister; Hypertension in his sister.He reports that he has been smoking pipe. He has never used smokeless tobacco. He reports that he does not drink alcohol and does not use drugs.    ROS Review of Systems  Constitutional: Negative.   HENT: Negative.    Eyes:  Negative for visual disturbance.  Respiratory:  Positive for shortness of breath and wheezing. Negative for cough.   Cardiovascular:  Positive for leg swelling. Negative for chest pain and palpitations.  Gastrointestinal:  Negative for abdominal pain, diarrhea, nausea and vomiting.  Genitourinary:  Negative for difficulty urinating.  Musculoskeletal:  Negative for arthralgias  and myalgias.  Skin:  Negative  for rash.  Neurological:  Negative for headaches.  Psychiatric/Behavioral:  Negative for sleep disturbance.     Objective:  BP 118/62   Pulse 64   Temp 97.8 F (36.6 C)   Ht _0  (1.778 m)   Wt 221 lb 9.6 oz (100.5 kg)   SpO2 97%   BMI 31.80 kg/m   BP Readings from Last 3 Encounters:  12/02/21 118/62  11/20/21 (!) 129/53  11/10/21 (!) 119/44    Wt Readings from Last 3 Encounters:  12/02/21 221 lb 9.6 oz (100.5 kg)  11/19/21 212 lb 4.9 oz (96.3 kg)  11/10/21 211 lb 3.2 oz (95.8 kg)     Physical Exam Constitutional:      General: He is not in acute distress.    Appearance: He is well-developed.  HENT:     Head: Normocephalic and atraumatic.     Right Ear: External ear normal.     Left Ear: External ear normal.     Nose: Nose normal.  Eyes:     Conjunctiva/sclera: Conjunctivae normal.     Pupils: Pupils are equal, round, and reactive to light.  Cardiovascular:     Rate and Rhythm: Normal rate and regular rhythm.     Heart sounds: Normal heart sounds. No murmur heard. Pulmonary:     Effort: Pulmonary effort is normal. No respiratory distress.     Breath sounds: Normal breath sounds. No wheezing or rales.  Abdominal:     Palpations: Abdomen is soft.     Tenderness: There is no abdominal tenderness.  Musculoskeletal:        General: Normal range of motion.     Cervical back: Normal range of motion and neck supple.     Right lower leg: Edema (2+) present.     Left lower leg: Edema (2+) present.  Skin:    General: Skin is warm and dry.  Neurological:     Mental Status: He is alert and oriented to person, place, and time.     Deep Tendon Reflexes: Reflexes are normal and symmetric.  Psychiatric:        Behavior: Behavior normal.        Thought Content: Thought content normal.        Judgment: Judgment normal.       Assessment & Plan:   Daniel Myers was seen today for hospitalization follow-up.  Diagnoses and all orders for this visit:  Acute on chronic  combined systolic and diastolic CHF (congestive heart failure) (HCC) -     CBC with Differential/Platelet -     Brain natriuretic peptide -     CMP14+EGFR  Insulin dependent type 2 diabetes mellitus (HCC) -     CBC with Differential/Platelet -     Brain natriuretic peptide -     CMP14+EGFR  Renal transplant recipient  CKD (chronic kidney disease) stage 4, GFR 15-29 ml/min (HCC) -     CBC with Differential/Platelet -     Brain natriuretic peptide -     CMP14+EGFR  Persistent atrial fibrillation (HCC) -     CBC with Differential/Platelet -     Brain natriuretic peptide -     CMP14+EGFR  Other orders -     spironolactone (ALDACTONE) 25 MG tablet; Take 1 tablet (25 mg total) by mouth daily.       I have discontinued Levar L. Rampy's amoxicillin-clavulanate, levofloxacin, and potassium chloride SA. I am also having him start on spironolactone. Additionally,  I am having him maintain his tacrolimus, sodium bicarbonate, mycophenolate, loratadine, predniSONE, ondansetron, fluticasone, acetaminophen, nitroGLYCERIN, sulfamethoxazole-trimethoprim, dapagliflozin propanediol, insulin regular, ALPRAZolam, insulin NPH Human, metoprolol succinate, ezetimibe, amiodarone, Lokelma, Eliquis, atorvastatin, albuterol, furosemide, and Verquvo.  Allergies as of 12/02/2021       Reactions   Tape Rash   Use paper tape.    Trazodone And Nefazodone Palpitations   tachycardia   Mirtazapine    imbalance   Elemental Sulfur Rash   Sulfa Antibiotics Rash        Medication List        Accurate as of December 02, 2021 10:10 AM. If you have any questions, ask your nurse or doctor.          STOP taking these medications    amoxicillin-clavulanate 875-125 MG tablet Commonly known as: AUGMENTIN Stopped by: Claretta Fraise, MD   levofloxacin 500 MG tablet Commonly known as: LEVAQUIN Stopped by: Claretta Fraise, MD   potassium chloride SA 20 MEQ tablet Commonly known as: Klor-Con M20 Stopped  by: Claretta Fraise, MD       TAKE these medications    acetaminophen 500 MG tablet Commonly known as: TYLENOL Take 500 mg by mouth every 8 (eight) hours as needed for moderate pain.   albuterol 108 (90 Base) MCG/ACT inhaler Commonly known as: VENTOLIN HFA Inhale 2 puffs into the lungs every 6 (six) hours as needed for wheezing or shortness of breath.   ALPRAZolam 0.25 MG tablet Commonly known as: XANAX Take 1 tablet (0.25 mg total) by mouth at bedtime as needed for anxiety.   amiodarone 200 MG tablet Commonly known as: PACERONE Take 0.5 tablets (100 mg total) by mouth daily.   atorvastatin 40 MG tablet Commonly known as: LIPITOR Take 1 tablet (40 mg total) by mouth daily.   dapagliflozin propanediol 10 MG Tabs tablet Commonly known as: Farxiga Take 1 tablet (10 mg total) by mouth daily before breakfast.   Eliquis 5 MG Tabs tablet Generic drug: apixaban Take 1 tablet by mouth twice daily   ezetimibe 10 MG tablet Commonly known as: Zetia Take 1 tablet (10 mg total) by mouth daily.   fluticasone 50 MCG/ACT nasal spray Commonly known as: FLONASE USE ONE SPRAY(S) IN EACH NOSTRIL ONCE DAILY   furosemide 20 MG tablet Commonly known as: LASIX Take 2 tablets (40 mg total) by mouth 2 (two) times daily.   insulin NPH Human 100 UNIT/ML injection Commonly known as: NOVOLIN N 10 units AC breakfast and 10 units AC supper   insulin regular 100 units/mL injection Commonly known as: NOVOLIN R 16 units w bkfst and 13 units w dinner   Lokelma 10 g Pack packet Generic drug: sodium zirconium cyclosilicate Take 10 g by mouth as directed. By the HF clinic What changed: additional instructions   loratadine 10 MG tablet Commonly known as: CLARITIN Take 10 mg by mouth daily as needed for allergies.   metoprolol succinate 100 MG 24 hr tablet Commonly known as: TOPROL-XL Take 1 tablet (100 mg total) by mouth daily. Take with or immediately following a meal.   mycophenolate 250  MG capsule Commonly known as: CELLCEPT Take 500 mg by mouth 2 (two) times daily.   nitroGLYCERIN 0.4 MG SL tablet Commonly known as: NITROSTAT Place 1 tablet (0.4 mg total) under the tongue every 5 (five) minutes as needed for chest pain.   ondansetron 4 MG tablet Commonly known as: ZOFRAN Take 1 tablet (4 mg total) by mouth every 8 (eight) hours as needed  for nausea or vomiting.   predniSONE 5 MG tablet Commonly known as: DELTASONE Take 5 mg by mouth daily with breakfast.   sodium bicarbonate 650 MG tablet Take 1,300 mg by mouth 2 (two) times daily.   spironolactone 25 MG tablet Commonly known as: ALDACTONE Take 1 tablet (25 mg total) by mouth daily. Started by: Claretta Fraise, MD   sulfamethoxazole-trimethoprim 400-80 MG tablet Commonly known as: BACTRIM Take 1 tablet by mouth every Monday, Wednesday, and Friday.   tacrolimus 0.5 MG capsule Commonly known as: PROGRAF Take 1 mg by mouth in the morning and at bedtime.   Verquvo 2.5 MG Tabs Generic drug: Vericiguat Take 2.5 mg by mouth daily.         Follow-up: Return in about 2 weeks (around 12/16/2021).  Claretta Fraise, M.D.

## 2021-12-03 ENCOUNTER — Telehealth (HOSPITAL_COMMUNITY): Payer: Self-pay | Admitting: Family Medicine

## 2021-12-03 ENCOUNTER — Telehealth: Payer: Self-pay | Admitting: Family Medicine

## 2021-12-03 ENCOUNTER — Ambulatory Visit (INDEPENDENT_AMBULATORY_CARE_PROVIDER_SITE_OTHER): Payer: Medicare Other

## 2021-12-03 DIAGNOSIS — I130 Hypertensive heart and chronic kidney disease with heart failure and stage 1 through stage 4 chronic kidney disease, or unspecified chronic kidney disease: Secondary | ICD-10-CM | POA: Diagnosis not present

## 2021-12-03 DIAGNOSIS — M1712 Unilateral primary osteoarthritis, left knee: Secondary | ICD-10-CM

## 2021-12-03 DIAGNOSIS — I2510 Atherosclerotic heart disease of native coronary artery without angina pectoris: Secondary | ICD-10-CM

## 2021-12-03 DIAGNOSIS — E1122 Type 2 diabetes mellitus with diabetic chronic kidney disease: Secondary | ICD-10-CM

## 2021-12-03 DIAGNOSIS — I214 Non-ST elevation (NSTEMI) myocardial infarction: Secondary | ICD-10-CM

## 2021-12-03 DIAGNOSIS — I252 Old myocardial infarction: Secondary | ICD-10-CM

## 2021-12-03 DIAGNOSIS — D631 Anemia in chronic kidney disease: Secondary | ICD-10-CM

## 2021-12-03 DIAGNOSIS — I4819 Other persistent atrial fibrillation: Secondary | ICD-10-CM

## 2021-12-03 DIAGNOSIS — M19012 Primary osteoarthritis, left shoulder: Secondary | ICD-10-CM

## 2021-12-03 DIAGNOSIS — E782 Mixed hyperlipidemia: Secondary | ICD-10-CM

## 2021-12-03 DIAGNOSIS — N189 Chronic kidney disease, unspecified: Secondary | ICD-10-CM

## 2021-12-03 DIAGNOSIS — I5043 Acute on chronic combined systolic (congestive) and diastolic (congestive) heart failure: Secondary | ICD-10-CM | POA: Diagnosis not present

## 2021-12-03 DIAGNOSIS — E669 Obesity, unspecified: Secondary | ICD-10-CM

## 2021-12-03 NOTE — Telephone Encounter (Signed)
Pts daughter aware and appt already scheduled for next week.

## 2021-12-03 NOTE — Telephone Encounter (Signed)
Stop Lasix for now, have him followup in office next week.  BMET Monday.

## 2021-12-03 NOTE — Telephone Encounter (Signed)
Make Dr aware of lab results, please call [place] (Nurse)

## 2021-12-04 LAB — CMP14+EGFR
ALT: 15 IU/L (ref 0–44)
AST: 17 IU/L (ref 0–40)
Albumin/Globulin Ratio: 2.1 (ref 1.2–2.2)
Albumin: 3.4 g/dL — ABNORMAL LOW (ref 3.8–4.8)
Alkaline Phosphatase: 103 IU/L (ref 44–121)
BUN/Creatinine Ratio: 21 (ref 10–24)
BUN: 61 mg/dL — ABNORMAL HIGH (ref 8–27)
Bilirubin Total: 0.5 mg/dL (ref 0.0–1.2)
CO2: 21 mmol/L (ref 20–29)
Calcium: 7.4 mg/dL — ABNORMAL LOW (ref 8.6–10.2)
Chloride: 97 mmol/L (ref 96–106)
Creatinine, Ser: 2.9 mg/dL — ABNORMAL HIGH (ref 0.76–1.27)
Globulin, Total: 1.6 g/dL (ref 1.5–4.5)
Glucose: 179 mg/dL — ABNORMAL HIGH (ref 70–99)
Potassium: 4.7 mmol/L (ref 3.5–5.2)
Sodium: 133 mmol/L — ABNORMAL LOW (ref 134–144)
Total Protein: 5 g/dL — ABNORMAL LOW (ref 6.0–8.5)
eGFR: 22 mL/min/{1.73_m2} — ABNORMAL LOW (ref >59)

## 2021-12-04 LAB — IMMATURE CELLS: Metamyelocytes: 3 % — ABNORMAL HIGH (ref 0–0)

## 2021-12-04 LAB — CBC WITH DIFFERENTIAL/PLATELET
Basophils Absolute: 0 10*3/uL (ref 0.0–0.2)
Basos: 0 %
EOS (ABSOLUTE): 0.1 10*3/uL (ref 0.0–0.4)
Eos: 2 %
Hematocrit: 25.7 % — ABNORMAL LOW (ref 37.5–51.0)
Hemoglobin: 8 g/dL — CL (ref 13.0–17.7)
Lymphocytes Absolute: 0.6 10*3/uL — ABNORMAL LOW (ref 0.7–3.1)
Lymphs: 11 %
MCH: 28 pg (ref 26.6–33.0)
MCHC: 31.1 g/dL — ABNORMAL LOW (ref 31.5–35.7)
MCV: 90 fL (ref 79–97)
Monocytes Absolute: 0.2 10*3/uL (ref 0.1–0.9)
Monocytes: 4 %
Neutrophils Absolute: 4.2 10*3/uL (ref 1.4–7.0)
Neutrophils: 80 %
Platelets: 158 10*3/uL (ref 150–450)
RBC: 2.86 x10E6/uL — ABNORMAL LOW (ref 4.14–5.80)
RDW: 13.7 % (ref 11.6–15.4)
WBC: 5.3 10*3/uL (ref 3.4–10.8)

## 2021-12-04 LAB — BRAIN NATRIURETIC PEPTIDE: BNP: 4908.1 pg/mL — ABNORMAL HIGH (ref 0.0–100.0)

## 2021-12-05 ENCOUNTER — Other Ambulatory Visit: Payer: Self-pay | Admitting: Family Medicine

## 2021-12-05 DIAGNOSIS — G4700 Insomnia, unspecified: Secondary | ICD-10-CM

## 2021-12-07 ENCOUNTER — Encounter: Payer: Self-pay | Admitting: *Deleted

## 2021-12-07 ENCOUNTER — Ambulatory Visit: Payer: Self-pay | Admitting: *Deleted

## 2021-12-07 NOTE — Patient Instructions (Addendum)
Visit Information  Thank you for taking time to visit with me today. Please don't hesitate to contact me if I can be of assistance to you.   Remember for better absorption of your iron tablets  Take vitamin C. Four ounces (1/2 cup) of orange juice is enough to increase iron absorption. Other sources of vitamin C include citrus fruit, fresh bell peppers, strawberries, cantaloupe, and fresh broccoli. Iron can lead to constipation so please take a stool softener like Colace  RAMPS [email or web address]  Click on Resources Ramps June 2018 on this site  Following are the goals we discussed today:   Goals Addressed               This Visit's Progress     Patient Stated     increase hemaglobin, decrease anemia fatigue (THN) (pt-stated)   Not on track     Care Coordination Interventions: recommended promotion of rest and energy-conserving measures to manage fatigue, such as balancing activity with periods of rest encouraged dietary changes to increase dietary intake of iron, Vitamin M42 and folic acid as advised/prescribed Assessed social determinant of health barriers  Reviewed and discussed the last hemoglobin level of 8 on 12/02/21 and the 8's during recent admission  Discussed and sent information on the absorption of iron with use of vitamin C foods Discussed use of stool softener to prevent constipation Sent via my chart education on iron rich foods, anemia       Resources for home ramps [facility]) (pt-stated)   Not on track     Care Coordination Interventions: Evaluation of current treatment plan related to home ramp and patient's adherence to plan as established by provider Discussed plans with patient for ongoing care management follow up and provided patient with direct contact information for care management team Provided resources for ramps via my  chart after summary visit         Our next appointment is by telephone on 12/21/21 at 1130  Please call the care guide team at [phone] if you need to cancel or reschedule your appointment.   If you are experiencing a Mental Health or [place] or need someone to talk to, please call the Suicide and Crisis Lifeline: 988 call the [place] National Suicide Prevention Lifeline: 863-394-0006 or TTY: 316-289-7692 TTY 334 379 3495) to talk to a trained counselor call 1-800-273-TALK (toll free, 24 hour hotline) call the [facility]: [phone] call 911   Patient verbalizes understanding of instructions and care plan provided today and agrees to view in [place]. Active MyChart status and patient understanding of how to access instructions and care plan via MyChart confirmed with patient.     The patient has been provided with contact information for the care management team and has been advised to call with any health related questions or concerns.   Elaya Droege L. Lavina Hamman, RN, BSN, [place] Coordinator Office number [phone]

## 2021-12-07 NOTE — Patient Outreach (Signed)
  Care Coordination   Initial Visit Note   12/07/2021 Name: Daniel Myers MRN: [id number] DOB: 1945/12/08  Daniel Myers is a 76 y.o. year old male who sees Stacks, [name], MD for primary care. I spoke with  [name] by phone today.  What matters to the patients health and wellness today?  He is doing well except some anemia symptoms. Stays tired. Cecille Rubin did purchase the iron tablets recommended by Dr Livia Snellen.  They are working on finding someone to assist with a ramp     Goals Addressed               This Visit's Progress     Patient Stated     increase hemaglobin, decrease anemia fatigue (THN) (pt-stated)   Not on track     Care Coordination Interventions: recommended promotion of rest and energy-conserving measures to manage fatigue, such as balancing activity with periods of rest encouraged dietary changes to increase dietary intake of iron, Vitamin C12 and folic acid as advised/prescribed Assessed social determinant of health barriers  Reviewed and discussed the last hemoglobin level of 8 on 12/02/21 and the 8's during recent admission  Discussed and sent information on the absorption of iron with use of vitamin C foods Discussed use of stool softener to prevent constipation Sent via my chart education on iron rich foods, anemia       Resources for home ramps [facility]) (pt-stated)   Not on track     Care Coordination Interventions: Evaluation of current treatment plan related to home ramp and patient's adherence to plan as established by provider Discussed plans with patient for ongoing care management follow up and provided patient with direct contact information for care management team Provided resources for ramps via my chart after summary visit         SDOH assessments and interventions completed:  Yes  SDOH Interventions Today    Flowsheet Row Most Recent Value  SDOH Interventions   Food Insecurity Interventions Intervention Not Indicated  Housing  Interventions Intervention Not Indicated  Transportation Interventions Intervention Not Indicated  Utilities Interventions Intervention Not Indicated  Financial Strain Interventions Intervention Not Indicated        Care Coordination Interventions Activated:  Yes  Care Coordination Interventions:  Yes, provided   Follow up plan: Follow up call scheduled for 12/21/21    Encounter Outcome:  Pt. Visit Completed    Blong Busk L. Lavina Hamman, RN, BSN, [place] Coordinator Office number [phone]

## 2021-12-07 NOTE — Patient Outreach (Signed)
  Care Coordination   12/07/2021 Name: Daniel Myers MRN: [id number] DOB: [birth year]   Care Coordination Outreach Attempts:  An unsuccessful telephone outreach was attempted today to offer the patient information about available care coordination services as a benefit of their health plan.   Follow Up Plan:  Additional outreach attempts will be made to offer the patient care coordination information and services.   Encounter Outcome:  No Answer  Care Coordination Interventions Activated:  No   Care Coordination Interventions:  No, not indicated    Kilian Schwartz L. Lavina Hamman, RN, BSN, [place] Coordinator Office number [phone]

## 2021-12-09 ENCOUNTER — Encounter (HOSPITAL_COMMUNITY): Payer: Self-pay

## 2021-12-09 ENCOUNTER — Ambulatory Visit (HOSPITAL_COMMUNITY)
Admission: RE | Admit: 2021-12-09 | Discharge: 2021-12-09 | Disposition: A | Discharge status: Home / Self Care | Payer: Medicare Other | Source: Ambulatory Visit | Attending: Family Medicine | Admitting: Family Medicine

## 2021-12-09 VITALS — BP 110/58 | HR 60 | Wt 226.0 lb

## 2021-12-09 DIAGNOSIS — T8619 Other complication of kidney transplant: Secondary | ICD-10-CM | POA: Insufficient documentation

## 2021-12-09 DIAGNOSIS — N1832 Chronic kidney disease, stage 3b: Secondary | ICD-10-CM

## 2021-12-09 DIAGNOSIS — Z7901 Long term (current) use of anticoagulants: Secondary | ICD-10-CM | POA: Insufficient documentation

## 2021-12-09 DIAGNOSIS — I10 Essential (primary) hypertension: Secondary | ICD-10-CM

## 2021-12-09 DIAGNOSIS — D649 Anemia, unspecified: Secondary | ICD-10-CM | POA: Insufficient documentation

## 2021-12-09 DIAGNOSIS — Z79899 Other long term (current) drug therapy: Secondary | ICD-10-CM | POA: Insufficient documentation

## 2021-12-09 DIAGNOSIS — N183 Chronic kidney disease, stage 3 unspecified: Secondary | ICD-10-CM | POA: Diagnosis not present

## 2021-12-09 DIAGNOSIS — Y830 Surgical operation with transplant of whole organ as the cause of abnormal reaction of the patient, or of later complication, without mention of misadventure at the time of the procedure: Secondary | ICD-10-CM | POA: Diagnosis not present

## 2021-12-09 DIAGNOSIS — I252 Old myocardial infarction: Secondary | ICD-10-CM | POA: Insufficient documentation

## 2021-12-09 DIAGNOSIS — Z8673 Personal history of transient ischemic attack (TIA), and cerebral infarction without residual deficits: Secondary | ICD-10-CM | POA: Diagnosis not present

## 2021-12-09 DIAGNOSIS — Z8616 Personal history of COVID-19: Secondary | ICD-10-CM | POA: Diagnosis not present

## 2021-12-09 DIAGNOSIS — Z66 Do not resuscitate: Secondary | ICD-10-CM | POA: Diagnosis not present

## 2021-12-09 DIAGNOSIS — I4891 Unspecified atrial fibrillation: Secondary | ICD-10-CM | POA: Diagnosis not present

## 2021-12-09 DIAGNOSIS — E1122 Type 2 diabetes mellitus with diabetic chronic kidney disease: Secondary | ICD-10-CM | POA: Insufficient documentation

## 2021-12-09 DIAGNOSIS — I5023 Acute on chronic systolic (congestive) heart failure: Secondary | ICD-10-CM | POA: Diagnosis not present

## 2021-12-09 DIAGNOSIS — I130 Hypertensive heart and chronic kidney disease with heart failure and stage 1 through stage 4 chronic kidney disease, or unspecified chronic kidney disease: Secondary | ICD-10-CM | POA: Diagnosis not present

## 2021-12-09 DIAGNOSIS — I2510 Atherosclerotic heart disease of native coronary artery without angina pectoris: Secondary | ICD-10-CM

## 2021-12-09 DIAGNOSIS — Z7189 Other specified counseling: Secondary | ICD-10-CM

## 2021-12-09 DIAGNOSIS — I4819 Other persistent atrial fibrillation: Secondary | ICD-10-CM | POA: Diagnosis not present

## 2021-12-09 DIAGNOSIS — Z940 Kidney transplant status: Secondary | ICD-10-CM

## 2021-12-09 DIAGNOSIS — R5381 Other malaise: Secondary | ICD-10-CM

## 2021-12-09 DIAGNOSIS — D631 Anemia in chronic kidney disease: Secondary | ICD-10-CM

## 2021-12-09 LAB — BASIC METABOLIC PANEL
Anion gap: 9 (ref 5–15)
BUN: 65 mg/dL — ABNORMAL HIGH (ref 8–23)
CO2: 26 mmol/L (ref 22–32)
Calcium: 7.9 mg/dL — ABNORMAL LOW (ref 8.9–10.3)
Chloride: 102 mmol/L (ref 98–111)
Creatinine, Ser: 3.32 mg/dL — ABNORMAL HIGH (ref 0.61–1.24)
GFR, Estimated: 19 mL/min — ABNORMAL LOW (ref >60)
Glucose, Bld: 157 mg/dL — ABNORMAL HIGH (ref 70–99)
Potassium: 4.7 mmol/L (ref 3.5–5.1)
Sodium: 137 mmol/L (ref 135–145)

## 2021-12-09 LAB — BRAIN NATRIURETIC PEPTIDE: B Natriuretic Peptide: >4500 pg/mL — ABNORMAL HIGH (ref 0.0–100.0)

## 2021-12-09 LAB — MAGNESIUM: Magnesium: 3.2 mg/dL — ABNORMAL HIGH (ref 1.7–2.4)

## 2021-12-09 NOTE — Addendum Note (Signed)
Encounter addended by: Rafael Bihari, FNP on: 12/09/2021 11:04 AM  Actions taken: Clinical Note Signed

## 2021-12-09 NOTE — Progress Notes (Addendum)
PCP: Claretta Fraise, MD Nephrology: Dr. Joseph Berkshire Cardiology: Dr. Domenic Polite HF Cardiology: Dr. Aundra Dubin  76 y.o. with history of suspected CAD/NSTEMI, CKD stage 3 s/p renal transplant, chronic systolic CHF, and persistent atrial fibrillation was referred by Dr. Domenic Polite for evaluation of CHF, atrial fibrillation, and suspected CAD.  Patient was doing fairly well until 1/22.  He developed COVID-19 PNA.  Hospitalization was complicated by NSTEMI and the onset of atrial fibrillation.  Cath was not done with elevated creatinine.  Echo in 1/22 showed EF 40-45% with wall motion abnormalities.  Cardiolite done as an outpatient in 3/22 showed EF 21%, inferior and inferolateral fixed defect.    Patient had CVA in 2/22, likely related to atrial fibrillation.  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  He did PT.   Repeat echo in 4/22 showed EF down further to 25-30%.  RHC/LHC was done in 8/22.  This showed severe 3VD with occluded RCA with collaterals, 99% proximal-mid LAD stenosis, 80% ostial LCx, significant disease in OMs and diagonals.  RHC showed normal filling pressures and preserved cardiac output.   He saw Dr. Cyndia Bent and decided that he did not want to undergo CABG.  Dr. Aundra Dubin reviewed the cath films with Dr. Burt Knack. Could potentially intervene on LAD, but would essentially be a CTO procedure with increased risk to the patient (including worsening renal function).  Based on recently released REVIVED-BCIS trial, do not think that Mr. Muhammed would derive much benefit from PCI in this situation in the absence of significant angina (which he does not seem to have).   In 9/22, he had DCCV back to NSR.  HR in 40s at rest.   Follow up 1/23, he stopped Entresto due to light headedness. Unable to start low dose ARB due to elevated renal function. Nephrology stopped spironolactone 1/23. PCP started lisinopril 3/23.  Follow up 9/23, spiro restarted but later stopped with hyperkalemia and increase SCr. Echo  (9/23): EF 30-35%, grade II DD, RV mildly reduced.  Seen in ED 10/29/21 with a fall, no LOC. Rib fx 6-7.   Admitted 10/23 with NSTEMI. He was clear he did not want any invasive procedures, only medical management. PMT consulted and he was made DNR/DNI. Echo showed EF declined to 20-25%, grade III DD, RV mildly reduced. Cardiology and Nephrology followed during admission. He was diuresed with IV lasix, transitioned to lasix 20 mg qod. Hospitalization complicated by AKI on CKD and +UTI. Urine culture + klebsiella penumoniae and Farxiga stopped. He remained on Bactrim but this was for PJP ppx. Arlyce Harman and ACEi stopped, but Wilder Glade resumed at discharge. Discharged home, weight, 212 lbs.   Today he returns for post hospital HF follow up, with his daughter. Overall feeling fatigued. Saw PCP last week and had more swelling, spiro 25 restarted. Has not been urinating as briskly. He does OK with ADLs, but SOB walking short distances on flat ground with his walker around the house. Continues with abdominal tightness and worse LE swelling. Denies palpitations, abnormal bleeding, CP, dizziness,  or PND/Orthopnea. Appetite fair. No fever or chills. Weight at home 220 pounds. Taking all medications. Doing PT 2x/week and has [place] RN.  REDs: 37%  ECG (personally reviewed): sinus bradycardia, old inferior MI.  Labs (6/22): LDL 92, HDL 40, K 5.1, creatinine 1.7 Labs (8/22): K 5.4, creatinine 2.16, LFTs normal, TSH normal, plts 123, hgb 15.2 Labs (9/22): K 4.7, creatinine 2.11 Labs (11/22): K 4.9, creatinine 2.34, LDL 52, TGs 128, BNP 573  Labs (3/23): K 4.7, creatinine 2.34, LDL 54, HDL 34 Labs (5/23): K 4.2, creatinine 1.83 Labs (6/23): K 4.3, creatinine 1.7, LDL 50 Labs (10/23): K 4.7, creatinine 2.90, hgb 8  PMH: 1. Type 2 diabetes 2. HTN 3. Hyperlipidemia 4. H/o COVID-19 PNA in 1/22.  5. CKD: Stage 3.  History of renal transplant.   6. 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  7.  Thrombocytopenia 8. Skin cancer 9. OSA 10. CAD: NSTEMI in 1/22 during admission for COVID PNA.  HS-TnI to 8647.  Not cathed with elevated creatinine.  - Cardiolite (3/22): EF 21%, inferior and inferolateral fixed defect, apical fixed defect.  - LHC (8/22): Severe 3VD with occluded RCA with collaterals, 99% proximal-mid LAD stenosis, 80% ostial LCx, significant disease in OMs and diagonals. 11. Chronic systolic CHF: Ischemic cardiomyopathy.  - Echo (1/22): EF 40-45%, wall motion abnormalities.  - Echo (4/22): EF 25-30%, global hypokinesis.  - RHC/LHC (8/22): severe 3VD with occluded RCA with collaterals, 99% proximal-mid LAD stenosis, 80% ostial LCx, significant disease in OMs and diagonals. Mean RA 2, PA 21/7, mean PCWP 11, CI 2.2 Fick/2.33 thermodilution - Echo (9/23): EF 30-35%, grade II DD, RV mildly reduced. - Echo (10/23): EF 20-25%, grade III DD, RV mildly reduced. 12. Atrial fibrillation: Persistent since 1/22. DCCV to NSR in 9/22.   Social History   Socioeconomic History   Marital status: Widowed    Spouse name: Not on file   Number of children: 3   Years of education: Not on file   Highest education level: Not on file  Occupational History   Not on file  Tobacco Use   Smoking status: Some Days    Types: Pipe   Smokeless tobacco: Never  Vaping Use   Vaping Use: Never used  Substance and Sexual Activity   Alcohol use: Never   Drug use: Never   Sexual activity: Not Currently  Other Topics Concern   Not on file  Social History Narrative   Wife passed away in 05-07-07.   2 daughters, 1 son. All live close by.    3 grandchildren.    Social Determinants of Health   Financial Resource Strain: Low Risk  (12/07/2021)   Overall Financial Resource Strain (CARDIA)    Difficulty of Paying Living Expenses: Not hard at all  Food Insecurity: No Food Insecurity (12/07/2021)   Hunger Vital Sign    Worried About Running Out of Food in the Last Year: Never true    Ran Out of Food in  the Last Year: Never true  Transportation Needs: No Transportation Needs (12/07/2021)   PRAPARE - Hydrologist (Medical): No    Lack of Transportation (Non-Medical): No  Physical Activity: Insufficiently Active (04/15/2021)   Exercise Vital Sign    Days of Exercise per Week: 7 days    Minutes of Exercise per Session: 20 min  Stress: No Stress Concern Present (04/15/2021)   [place]    Feeling of Stress : Not at all  Social Connections: Moderately Integrated (04/15/2021)   Social Connection and Isolation Panel [NHANES]    Frequency of Communication with Friends and Family: More than three times a week    Frequency of Social Gatherings with Friends and Family: More than three times a week    Attends Religious Services: 1 to 4 times per year    Active Member of [employer] or Organizations: No    Attends [employer]  or Organization Meetings: 1 to 4 times per year    Marital Status: Widowed  Intimate Partner Violence: Not At Risk (12/07/2021)   Humiliation, Afraid, Rape, and Kick questionnaire    Fear of Current or Ex-Partner: No    Emotionally Abused: No    Physically Abused: No    Sexually Abused: No   Family History  Problem Relation Age of Onset   Clotting disorder Mother    Hypertension Sister    Diabetes Sister    ROS: All systems reviewed and negative except as per HPI.   Current Outpatient Medications  Medication Sig Dispense Refill   acetaminophen (TYLENOL) 500 MG tablet Take 500 mg by mouth every 8 (eight) hours as needed for moderate pain.     albuterol (VENTOLIN HFA) 108 (90 Base) MCG/ACT inhaler Inhale 2 puffs into the lungs every 6 (six) hours as needed for wheezing or shortness of breath. 1 each 3   ALPRAZolam (XANAX) 0.25 MG tablet Take 1 tablet (0.25 mg total) by mouth at bedtime as needed for anxiety. 90 tablet 1   amiodarone (PACERONE) 200 MG tablet Take 0.5 tablets (100 mg total) by  mouth daily. 45 tablet 3   atorvastatin (LIPITOR) 40 MG tablet Take 1 tablet (40 mg total) by mouth daily. 90 tablet 3   dapagliflozin propanediol (FARXIGA) 10 MG TABS tablet Take 1 tablet (10 mg total) by mouth daily before breakfast. 90 tablet 3   ELIQUIS 5 MG TABS tablet Take 1 tablet by mouth twice daily 60 tablet 11   ezetimibe (ZETIA) 10 MG tablet Take 1 tablet (10 mg total) by mouth daily. 90 tablet 3   fluticasone (FLONASE) 50 MCG/ACT nasal spray USE ONE SPRAY(S) IN EACH NOSTRIL ONCE DAILY 16 g 11   furosemide (LASIX) 20 MG tablet Take 2 tablets (40 mg total) by mouth 2 (two) times daily. 120 tablet 6   insulin NPH Human (NOVOLIN N) 100 UNIT/ML injection 10 units AC breakfast and 10 units AC supper 30 mL 11   insulin regular (NOVOLIN R) 100 units/mL injection 16 units w bkfst and 13 units w dinner 10 mL 5   loratadine (CLARITIN) 10 MG tablet Take 10 mg by mouth daily as needed for allergies.     metoprolol succinate (TOPROL-XL) 100 MG 24 hr tablet Take 1 tablet (100 mg total) by mouth daily. Take with or immediately following a meal. 90 tablet 3   mycophenolate (CELLCEPT) 250 MG capsule Take 500 mg by mouth 2 (two) times daily.     nitroGLYCERIN (NITROSTAT) 0.4 MG SL tablet Place 1 tablet (0.4 mg total) under the tongue every 5 (five) minutes as needed for chest pain. 25 tablet 3   ondansetron (ZOFRAN) 4 MG tablet Take 1 tablet (4 mg total) by mouth every 8 (eight) hours as needed for nausea or vomiting. 20 tablet 0   predniSONE (DELTASONE) 5 MG tablet Take 5 mg by mouth daily with breakfast.     sodium bicarbonate 650 MG tablet Take 1,300 mg by mouth 2 (two) times daily.     sodium zirconium cyclosilicate (LOKELMA) 10 g PACK packet Take 10 g by mouth as directed. By the HF clinic (Patient taking differently: Take 10 g by mouth as directed. By the HF clinic just as needed) 30 each 0   spironolactone (ALDACTONE) 25 MG tablet Take 1 tablet (25 mg total) by mouth daily. 90 tablet 3    sulfamethoxazole-trimethoprim (BACTRIM) 400-80 MG tablet Take 1 tablet by mouth every Monday, Wednesday,  and Friday.     tacrolimus (PROGRAF) 0.5 MG capsule Take 1 mg by mouth in the morning and at bedtime.     Vericiguat (VERQUVO) 2.5 MG TABS Take 2.5 mg by mouth daily. 30 tablet 6   No current facility-administered medications for this encounter.   Wt Readings from Last 3 Encounters:  12/09/21 102.5 kg (226 lb)  12/02/21 100.5 kg (221 lb 9.6 oz)  11/19/21 96.3 kg (212 lb 4.9 oz)   BP (!) 110/58   Pulse 60   Wt 102.5 kg (226 lb)   SpO2 96%   BMI 32.43 kg/m  Physical Exam General:  NAD. No resp difficulty, arrived in [facility], pale HEENT: Normal Neck: Supple. JVP to ear. Carotids 2+ bilat; no bruits. No lymphadenopathy or thryomegaly appreciated. Cor: PMI nondisplaced. Regular rate & rhythm. No rubs, gallops or murmurs. Lungs: Clear Abdomen: +distendeded, nontender.No hepatosplenomegaly. No bruits or masses. Good bowel sounds. Extremities: No cyanosis, clubbing, rash, 3+ BLE edema to knees Neuro: Alert & oriented x 3, cranial nerves grossly intact. Moves all 4 extremities w/o difficulty. Affect pleasant.  Assessment/Plan: 1. Acute on Chronic systolic CHF: Echo in 3/79 with EF 25-30%.   Patient had NSTEMI during 1/22 admission with COVID-19, not cathed with CKD.  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 RHC in 8/22 with preserved cardiac output and low filling pressures.  Echo 10/23 during most recent admission for NSTEMI showed EF worse 20-25%, RV mildly decreased. NYHA class III, though seems mostly limited by fatigue. He is markedly volume overloaded on exam, weight up 16 lbs since discharge. ReDs only mildly elevated at 37%. GDMT limited  by CKD and previous orthostasis. - With worsening renal function, stop spiro. - Give Furoscix 80 + 40 KCL daily x 2 days (hold po Lasix while taking Furoscix). BMET, BNP and Mag today. - After he has completed Furoscix, restart Lasix 40 mg bid + 20  KCL daily. - Off lisinopril w/ AKI on CKD. Remain off for now, will plan to add back if able. - Continue Toprol XL 100 mg daily.  - Continue Farxiga 10 mg daily. No GU symptoms, follow closely with recent UTI. - Continue vericiguat 2.5 mg daily, did not tolerate 5 mg daily.  - He says that he would not be interested in ICD.  2. CAD: Cath in 8/22 with severe 3VD, occluded RCA with collaterals, 99% proximal-mid LAD stenosis, 80% ostial LCx, significant disease in OMs and diagonals.  With low EF and 3VD, I recommended CABG.  He saw Dr. Cyndia Bent, and is adamant that he does not want to have surgery.  Dr. Cyndia Bent wanted him to get an MRI for viability, but if he is not going to have CABG, would not get MRI.  I reviewed his cath films with Dr. Burt Knack for PCI options.  Could potentially intervene on LAD, but would essentially be a CTO procedure with increased risk to the patient (including worsening renal function).  Based on recent REVIVED-BCIS trial, I do not think that Mr. Petras would derive much benefit from PCI in this situation in the absence of significant angina (which he does not have currently).  Recent admit 10/23 with NSTEMI. He did not want any invasive interventions. Echo showed EF mildly lower than previous, 25-30%. No chest pain. ECG with old inferior MI, otherwise no acute changes. - Continue atorvastatin, good lipids in 3/23.  - He is not on ASA due to Eliquis use.  3. Atrial fibrillation: DCCV to NSR in 9/22. NSR today.  -  Cotninue amiodarone 100 mg daily.  LFTs and TSH ok 9/23, he will need regular eye exam while on amiodarone.  - Continue Eliquis 5 mg bid.  Recent hgb 8.0. PCP managing anemia. Will need to watch. - We have discussed AF ablation, but he is not interested in invasive procedures.   4. 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  5. CKD: stage 3.  Post-transplant.  Followed by nephrology, Dr. Joseph Berkshire. - Recent AKI with SCr up to 2.9. BMET today. - Stop spiro as above.  - Remain off ACEi for  now. 6. HTN: BP not elevated. Stop spironolactone as above. 7. Chronic Anemia: Likely related to CKD. He is on Eliquis for AF. No bleeding issues. - Recent hgb 8.0. He is on oral iron suppl. - May need Procrit/ESA 8. Physical Deconditioning: Continue PT. Daughter lives across the street and helps out. 9. GOC: DNR/DNI. Palliative services arranged at discharge. May be more appropriate for hospice.  Follow up next week with APP (post Furoscix, will need BMET and Mag). He is high risk for readmit with cardiorenal syndrome and physical deconditioning.  [place] , FNP-BC 12/09/2021

## 2021-12-09 NOTE — Progress Notes (Signed)
Furoscix order from was faxed over at 1151 with demographic sheet and todays office note was faxed.

## 2021-12-09 NOTE — Progress Notes (Signed)
ReDS Vest / Clip - 12/09/21 0930       ReDS Vest / Clip   Station Marker D    Ruler Value 35    ReDS Value Range Moderate volume overload    ReDS Actual Value 37

## 2021-12-09 NOTE — Addendum Note (Signed)
Encounter addended by: Payton Mccallum, RN on: 12/09/2021 12:02 PM  Actions taken: Clinical Note Signed

## 2021-12-09 NOTE — Patient Instructions (Addendum)
Thank you for coming in today  Labs were done today, if any labs are abnormal the clinic will call you No news is good news  Your physician recommends that you schedule a follow-up appointment in:  Next week in clinic  STOP Spironolactone  Furoscix with 40 meq of Potassium today 12/09/2021 and 12/10/2021 HOLD Lasix today and tomorrow while taking Furoscix  After Furoscix is completed RESTART Lasix 40 mg twice daily with  Potassium 20 meq daily     Do the following things EVERYDAY: Weigh yourself in the morning before breakfast. Write it down and keep it in a log. Take your medicines as prescribed Eat low salt foods--Limit salt (sodium) to 2000 mg per day.  Stay as active as you can everyday Limit all fluids for the day to less than 2 liters  At the [facility], you and your health needs are our priority. As part of our continuing mission to provide you with exceptional heart care, we have created designated Provider Care Teams. These Care Teams include your primary Cardiologist (physician) and Advanced Practice Providers (APPs- Physician Assistants and Nurse Practitioners) who all work together to provide you with the care you need, when you need it.   You may see any of the following providers on your designated Care Team at your next follow up: Dr Glori Bickers Dr Loralie Champagne Dr. Roxana Hires, NP Lyda Jester, [place] [facility] [place], [place] Forestine Na, NP Audry Riles, PharmD   Please be sure to bring in all your medications bottles to every appointment.   If you have any questions or concerns before your next appointment please send [place] a message through [place] or call our office at [phone].    TO LEAVE A MESSAGE FOR THE NURSE SELECT OPTION 2, PLEASE LEAVE A MESSAGE INCLUDING: YOUR NAME DATE OF BIRTH CALL BACK NUMBER REASON FOR CALL**this is important as we prioritize the call backs  YOU WILL RECEIVE A CALL BACK  THE SAME DAY AS LONG AS YOU CALL BEFORE 4:00 PM

## 2021-12-12 ENCOUNTER — Other Ambulatory Visit: Payer: Self-pay | Admitting: Family Medicine

## 2021-12-12 DIAGNOSIS — G4700 Insomnia, unspecified: Secondary | ICD-10-CM

## 2021-12-14 ENCOUNTER — Emergency Department (HOSPITAL_COMMUNITY): Payer: Medicare Other

## 2021-12-14 ENCOUNTER — Other Ambulatory Visit: Payer: Self-pay

## 2021-12-14 ENCOUNTER — Encounter (HOSPITAL_COMMUNITY): Payer: Self-pay | Admitting: Emergency Medicine

## 2021-12-14 ENCOUNTER — Inpatient Hospital Stay (HOSPITAL_COMMUNITY)
Admission: EM | Admit: 2021-12-14 | Discharge: 2021-12-21 | DRG: 698 | Disposition: A | Discharge status: Home Health | Payer: Medicare Other | Attending: Family Medicine | Admitting: Family Medicine

## 2021-12-14 DIAGNOSIS — I130 Hypertensive heart and chronic kidney disease with heart failure and stage 1 through stage 4 chronic kidney disease, or unspecified chronic kidney disease: Secondary | ICD-10-CM | POA: Diagnosis present

## 2021-12-14 DIAGNOSIS — Z888 Allergy status to other drugs, medicaments and biological substances status: Secondary | ICD-10-CM

## 2021-12-14 DIAGNOSIS — W19XXXA Unspecified fall, initial encounter: Secondary | ICD-10-CM | POA: Diagnosis present

## 2021-12-14 DIAGNOSIS — Z832 Family history of diseases of the blood and blood-forming organs and certain disorders involving the immune mechanism: Secondary | ICD-10-CM

## 2021-12-14 DIAGNOSIS — I5043 Acute on chronic combined systolic (congestive) and diastolic (congestive) heart failure: Secondary | ICD-10-CM | POA: Diagnosis present

## 2021-12-14 DIAGNOSIS — D696 Thrombocytopenia, unspecified: Secondary | ICD-10-CM | POA: Diagnosis present

## 2021-12-14 DIAGNOSIS — E669 Obesity, unspecified: Secondary | ICD-10-CM | POA: Diagnosis present

## 2021-12-14 DIAGNOSIS — E871 Hypo-osmolality and hyponatremia: Secondary | ICD-10-CM | POA: Diagnosis present

## 2021-12-14 DIAGNOSIS — I5023 Acute on chronic systolic (congestive) heart failure: Secondary | ICD-10-CM | POA: Diagnosis not present

## 2021-12-14 DIAGNOSIS — I10 Essential (primary) hypertension: Secondary | ICD-10-CM | POA: Diagnosis present

## 2021-12-14 DIAGNOSIS — D849 Immunodeficiency, unspecified: Secondary | ICD-10-CM | POA: Diagnosis present

## 2021-12-14 DIAGNOSIS — E875 Hyperkalemia: Secondary | ICD-10-CM | POA: Diagnosis present

## 2021-12-14 DIAGNOSIS — R531 Weakness: Secondary | ICD-10-CM | POA: Diagnosis not present

## 2021-12-14 DIAGNOSIS — N179 Acute kidney failure, unspecified: Secondary | ICD-10-CM | POA: Diagnosis present

## 2021-12-14 DIAGNOSIS — D84821 Immunodeficiency due to drugs: Secondary | ICD-10-CM | POA: Diagnosis present

## 2021-12-14 DIAGNOSIS — N184 Chronic kidney disease, stage 4 (severe): Secondary | ICD-10-CM | POA: Diagnosis present

## 2021-12-14 DIAGNOSIS — I214 Non-ST elevation (NSTEMI) myocardial infarction: Secondary | ICD-10-CM | POA: Diagnosis present

## 2021-12-14 DIAGNOSIS — Z79621 Long term (current) use of calcineurin inhibitor: Secondary | ICD-10-CM

## 2021-12-14 DIAGNOSIS — J301 Allergic rhinitis due to pollen: Secondary | ICD-10-CM

## 2021-12-14 DIAGNOSIS — T8619 Other complication of kidney transplant: Principal | ICD-10-CM | POA: Diagnosis present

## 2021-12-14 DIAGNOSIS — E1122 Type 2 diabetes mellitus with diabetic chronic kidney disease: Secondary | ICD-10-CM | POA: Diagnosis present

## 2021-12-14 DIAGNOSIS — D631 Anemia in chronic kidney disease: Secondary | ICD-10-CM | POA: Diagnosis present

## 2021-12-14 DIAGNOSIS — E8722 Chronic metabolic acidosis: Secondary | ICD-10-CM | POA: Diagnosis present

## 2021-12-14 DIAGNOSIS — Z7984 Long term (current) use of oral hypoglycemic drugs: Secondary | ICD-10-CM

## 2021-12-14 DIAGNOSIS — Z85828 Personal history of other malignant neoplasm of skin: Secondary | ICD-10-CM

## 2021-12-14 DIAGNOSIS — Z7901 Long term (current) use of anticoagulants: Secondary | ICD-10-CM | POA: Diagnosis not present

## 2021-12-14 DIAGNOSIS — Z7960 Long term (current) use of unspecified immunomodulators and immunosuppressants: Secondary | ICD-10-CM

## 2021-12-14 DIAGNOSIS — E782 Mixed hyperlipidemia: Secondary | ICD-10-CM | POA: Diagnosis present

## 2021-12-14 DIAGNOSIS — L03115 Cellulitis of right lower limb: Secondary | ICD-10-CM | POA: Diagnosis not present

## 2021-12-14 DIAGNOSIS — I480 Paroxysmal atrial fibrillation: Secondary | ICD-10-CM

## 2021-12-14 DIAGNOSIS — E1165 Type 2 diabetes mellitus with hyperglycemia: Secondary | ICD-10-CM | POA: Diagnosis present

## 2021-12-14 DIAGNOSIS — I509 Heart failure, unspecified: Secondary | ICD-10-CM

## 2021-12-14 DIAGNOSIS — Z794 Long term (current) use of insulin: Secondary | ICD-10-CM

## 2021-12-14 DIAGNOSIS — N390 Urinary tract infection, site not specified: Secondary | ICD-10-CM | POA: Diagnosis not present

## 2021-12-14 DIAGNOSIS — E785 Hyperlipidemia, unspecified: Secondary | ICD-10-CM | POA: Diagnosis not present

## 2021-12-14 DIAGNOSIS — Z66 Do not resuscitate: Secondary | ICD-10-CM | POA: Diagnosis present

## 2021-12-14 DIAGNOSIS — Z882 Allergy status to sulfonamides status: Secondary | ICD-10-CM

## 2021-12-14 DIAGNOSIS — Z7952 Long term (current) use of systemic steroids: Secondary | ICD-10-CM

## 2021-12-14 DIAGNOSIS — Z8249 Family history of ischemic heart disease and other diseases of the circulatory system: Secondary | ICD-10-CM

## 2021-12-14 DIAGNOSIS — Z79899 Other long term (current) drug therapy: Secondary | ICD-10-CM

## 2021-12-14 DIAGNOSIS — Z6832 Body mass index (BMI) 32.0-32.9, adult: Secondary | ICD-10-CM

## 2021-12-14 DIAGNOSIS — S2242XA Multiple fractures of ribs, left side, initial encounter for closed fracture: Secondary | ICD-10-CM | POA: Diagnosis present

## 2021-12-14 DIAGNOSIS — Y830 Surgical operation with transplant of whole organ as the cause of abnormal reaction of the patient, or of later complication, without mention of misadventure at the time of the procedure: Secondary | ICD-10-CM | POA: Diagnosis present

## 2021-12-14 DIAGNOSIS — Z8673 Personal history of transient ischemic attack (TIA), and cerebral infarction without residual deficits: Secondary | ICD-10-CM

## 2021-12-14 DIAGNOSIS — I2510 Atherosclerotic heart disease of native coronary artery without angina pectoris: Secondary | ICD-10-CM | POA: Diagnosis present

## 2021-12-14 DIAGNOSIS — I4821 Permanent atrial fibrillation: Secondary | ICD-10-CM | POA: Diagnosis present

## 2021-12-14 DIAGNOSIS — I5041 Acute combined systolic (congestive) and diastolic (congestive) heart failure: Secondary | ICD-10-CM | POA: Diagnosis not present

## 2021-12-14 DIAGNOSIS — Z833 Family history of diabetes mellitus: Secondary | ICD-10-CM

## 2021-12-14 DIAGNOSIS — I639 Cerebral infarction, unspecified: Secondary | ICD-10-CM | POA: Diagnosis present

## 2021-12-14 DIAGNOSIS — E119 Type 2 diabetes mellitus without complications: Secondary | ICD-10-CM

## 2021-12-14 DIAGNOSIS — E1121 Type 2 diabetes mellitus with diabetic nephropathy: Secondary | ICD-10-CM | POA: Diagnosis present

## 2021-12-14 DIAGNOSIS — R296 Repeated falls: Secondary | ICD-10-CM | POA: Diagnosis present

## 2021-12-14 LAB — HEMOGLOBIN A1C
Hgb A1c MFr Bld: 5.8 % — ABNORMAL HIGH (ref 4.8–5.6)
Mean Plasma Glucose: 119.76 mg/dL

## 2021-12-14 LAB — CBC
HCT: 25.3 % — ABNORMAL LOW (ref 39.0–52.0)
Hemoglobin: 7.4 g/dL — ABNORMAL LOW (ref 13.0–17.0)
MCH: 27.6 pg (ref 26.0–34.0)
MCHC: 29.2 g/dL — ABNORMAL LOW (ref 30.0–36.0)
MCV: 94.4 fL (ref 80.0–100.0)
Platelets: 72 10*3/uL — ABNORMAL LOW (ref 150–400)
RBC: 2.68 MIL/uL — ABNORMAL LOW (ref 4.22–5.81)
RDW: 18.4 % — ABNORMAL HIGH (ref 11.5–15.5)
WBC: 4.3 10*3/uL (ref 4.0–10.5)
nRBC: 0 % (ref 0.0–0.2)

## 2021-12-14 LAB — COMPREHENSIVE METABOLIC PANEL
ALT: 15 U/L (ref 0–44)
AST: 16 U/L (ref 15–41)
Albumin: 3.1 g/dL — ABNORMAL LOW (ref 3.5–5.0)
Alkaline Phosphatase: 74 U/L (ref 38–126)
Anion gap: 8 (ref 5–15)
BUN: 73 mg/dL — ABNORMAL HIGH (ref 8–23)
CO2: 23 mmol/L (ref 22–32)
Calcium: 7.9 mg/dL — ABNORMAL LOW (ref 8.9–10.3)
Chloride: 103 mmol/L (ref 98–111)
Creatinine, Ser: 3.77 mg/dL — ABNORMAL HIGH (ref 0.61–1.24)
GFR, Estimated: 16 mL/min — ABNORMAL LOW (ref >60)
Glucose, Bld: 321 mg/dL — ABNORMAL HIGH (ref 70–99)
Potassium: 5.9 mmol/L — ABNORMAL HIGH (ref 3.5–5.1)
Sodium: 134 mmol/L — ABNORMAL LOW (ref 135–145)
Total Bilirubin: 1.5 mg/dL — ABNORMAL HIGH (ref 0.3–1.2)
Total Protein: 5.4 g/dL — ABNORMAL LOW (ref 6.5–8.1)

## 2021-12-14 LAB — MRSA NEXT GEN BY PCR, NASAL: MRSA by PCR Next Gen: DETECTED — AB

## 2021-12-14 LAB — GLUCOSE, CAPILLARY
Glucose-Capillary: 271 mg/dL — ABNORMAL HIGH (ref 70–99)
Glucose-Capillary: 265 mg/dL — ABNORMAL HIGH (ref 70–99)

## 2021-12-14 LAB — TROPONIN I (HIGH SENSITIVITY)
Troponin I (High Sensitivity): 127 ng/L (ref <18)
Troponin I (High Sensitivity): 130 ng/L (ref <18)

## 2021-12-14 LAB — BRAIN NATRIURETIC PEPTIDE: B Natriuretic Peptide: >4500 pg/mL — ABNORMAL HIGH (ref 0.0–100.0)

## 2021-12-14 MED ORDER — MYCOPHENOLATE MOFETIL 250 MG PO CAPS
500.0000 mg | ORAL_CAPSULE | Freq: Two times a day (BID) | ORAL | Status: DC
  Administered 2021-12-14 – 2021-12-21 (×14): 500 mg via ORAL
  Filled 2021-12-14 (×17): qty 2

## 2021-12-14 MED ORDER — ALPRAZOLAM 0.25 MG PO TABS
0.2500 mg | ORAL_TABLET | Freq: Every evening | ORAL | Status: DC | PRN
  Administered 2021-12-17 – 2021-12-18 (×2): 0.25 mg via ORAL
  Filled 2021-12-14 (×2): qty 1

## 2021-12-14 MED ORDER — EZETIMIBE 10 MG PO TABS
10.0000 mg | ORAL_TABLET | Freq: Every day | ORAL | Status: DC
  Administered 2021-12-15 – 2021-12-21 (×7): 10 mg via ORAL
  Filled 2021-12-14 (×7): qty 1

## 2021-12-14 MED ORDER — INSULIN NPH (HUMAN) (ISOPHANE) 100 UNIT/ML ~~LOC~~ SUSP
10.0000 [IU] | Freq: Two times a day (BID) | SUBCUTANEOUS | Status: DC
  Filled 2021-12-14: qty 10

## 2021-12-14 MED ORDER — APIXABAN 5 MG PO TABS
5.0000 mg | ORAL_TABLET | Freq: Two times a day (BID) | ORAL | Status: DC
  Administered 2021-12-14 – 2021-12-21 (×14): 5 mg via ORAL
  Filled 2021-12-14 (×14): qty 1

## 2021-12-14 MED ORDER — CALCIUM GLUCONATE-NACL 1-0.675 GM/50ML-% IV SOLN
1.0000 g | Freq: Once | INTRAVENOUS | Status: AC
  Administered 2021-12-14: 1000 mg via INTRAVENOUS
  Filled 2021-12-14: qty 50

## 2021-12-14 MED ORDER — INSULIN NPH (HUMAN) (ISOPHANE) 100 UNIT/ML ~~LOC~~ SUSP
10.0000 [IU] | Freq: Two times a day (BID) | SUBCUTANEOUS | Status: DC
  Administered 2021-12-14 – 2021-12-16 (×4): 10 [IU] via SUBCUTANEOUS
  Filled 2021-12-14: qty 10

## 2021-12-14 MED ORDER — ACETAMINOPHEN 650 MG RE SUPP: 650.0000 mg | Freq: Four times a day (QID) | RECTAL | Status: DC | PRN

## 2021-12-14 MED ORDER — VERICIGUAT 2.5 MG PO TABS
2.5000 mg | ORAL_TABLET | Freq: Every day | ORAL | Status: DC
  Administered 2021-12-15 – 2021-12-20 (×6): 2.5 mg via ORAL

## 2021-12-14 MED ORDER — ONDANSETRON HCL 4 MG PO TABS: 4.0000 mg | ORAL_TABLET | Freq: Four times a day (QID) | ORAL | Status: DC | PRN

## 2021-12-14 MED ORDER — ALBUTEROL SULFATE (2.5 MG/3ML) 0.083% IN NEBU: 2.5000 mg | INHALATION_SOLUTION | Freq: Four times a day (QID) | RESPIRATORY_TRACT | Status: DC | PRN

## 2021-12-14 MED ORDER — SODIUM CHLORIDE 0.9 % IV SOLN: 250.0000 mL | INTRAVENOUS | Status: DC | PRN

## 2021-12-14 MED ORDER — FLUTICASONE PROPIONATE 50 MCG/ACT NA SUSP: 1.0000 | Freq: Every day | NASAL | Status: DC | PRN

## 2021-12-14 MED ORDER — SODIUM ZIRCONIUM CYCLOSILICATE 10 G PO PACK
10.0000 g | PACK | Freq: Every day | ORAL | Status: DC
  Administered 2021-12-14 – 2021-12-15 (×2): 10 g via ORAL
  Filled 2021-12-14: qty 1
  Filled 2021-12-14: qty 2

## 2021-12-14 MED ORDER — FUROSEMIDE 10 MG/ML IJ SOLN
80.0000 mg | Freq: Once | INTRAMUSCULAR | Status: AC
  Administered 2021-12-14: 80 mg via INTRAVENOUS
  Filled 2021-12-14: qty 8

## 2021-12-14 MED ORDER — ALBUTEROL SULFATE HFA 108 (90 BASE) MCG/ACT IN AERS: 2.0000 | INHALATION_SPRAY | Freq: Four times a day (QID) | RESPIRATORY_TRACT | Status: DC | PRN

## 2021-12-14 MED ORDER — ACETAMINOPHEN 325 MG PO TABS
650.0000 mg | ORAL_TABLET | Freq: Four times a day (QID) | ORAL | Status: DC | PRN
  Filled 2021-12-14: qty 2

## 2021-12-14 MED ORDER — ATORVASTATIN CALCIUM 40 MG PO TABS
40.0000 mg | ORAL_TABLET | Freq: Every day | ORAL | Status: DC
  Administered 2021-12-15 – 2021-12-21 (×7): 40 mg via ORAL
  Filled 2021-12-14 (×7): qty 1

## 2021-12-14 MED ORDER — LORATADINE 10 MG PO TABS: 10.0000 mg | ORAL_TABLET | Freq: Every day | ORAL | Status: DC | PRN

## 2021-12-14 MED ORDER — INSULIN ASPART 100 UNIT/ML IJ SOLN
0.0000 [IU] | Freq: Three times a day (TID) | INTRAMUSCULAR | Status: DC
  Administered 2021-12-14: 5 [IU] via SUBCUTANEOUS
  Administered 2021-12-15: 3 [IU] via SUBCUTANEOUS
  Administered 2021-12-15 – 2021-12-16 (×3): 5 [IU] via SUBCUTANEOUS
  Administered 2021-12-16: 3 [IU] via SUBCUTANEOUS
  Administered 2021-12-17: 2 [IU] via SUBCUTANEOUS
  Administered 2021-12-17: 5 [IU] via SUBCUTANEOUS
  Administered 2021-12-17: 2 [IU] via SUBCUTANEOUS
  Administered 2021-12-18 (×2): 3 [IU] via SUBCUTANEOUS
  Administered 2021-12-18: 1 [IU] via SUBCUTANEOUS
  Administered 2021-12-19: 5 [IU] via SUBCUTANEOUS
  Administered 2021-12-19: 3 [IU] via SUBCUTANEOUS
  Administered 2021-12-19: 1 [IU] via SUBCUTANEOUS
  Administered 2021-12-20: 3 [IU] via SUBCUTANEOUS
  Administered 2021-12-20: 2 [IU] via SUBCUTANEOUS
  Administered 2021-12-20: 5 [IU] via SUBCUTANEOUS
  Administered 2021-12-21: 2 [IU] via SUBCUTANEOUS

## 2021-12-14 MED ORDER — ONDANSETRON HCL 4 MG/2ML IJ SOLN: 4.0000 mg | Freq: Four times a day (QID) | INTRAMUSCULAR | Status: DC | PRN

## 2021-12-14 MED ORDER — MUPIROCIN 2 % EX OINT
1.0000 | TOPICAL_OINTMENT | Freq: Two times a day (BID) | CUTANEOUS | Status: AC
  Administered 2021-12-14 – 2021-12-19 (×10): 1 via NASAL
  Filled 2021-12-14 (×2): qty 22

## 2021-12-14 MED ORDER — SODIUM CHLORIDE 0.9% FLUSH: 3.0000 mL | INTRAVENOUS | Status: DC | PRN

## 2021-12-14 MED ORDER — CHLORHEXIDINE GLUCONATE CLOTH 2 % EX PADS
6.0000 | MEDICATED_PAD | Freq: Every day | CUTANEOUS | Status: DC
  Administered 2021-12-14: 6 via TOPICAL

## 2021-12-14 MED ORDER — AMIODARONE HCL 200 MG PO TABS
100.0000 mg | ORAL_TABLET | Freq: Every day | ORAL | Status: DC
  Administered 2021-12-15 – 2021-12-21 (×7): 100 mg via ORAL
  Filled 2021-12-14 (×7): qty 1

## 2021-12-14 MED ORDER — CHLORHEXIDINE GLUCONATE CLOTH 2 % EX PADS
6.0000 | MEDICATED_PAD | Freq: Every day | CUTANEOUS | Status: DC
  Administered 2021-12-15 – 2021-12-16 (×2): 6 via TOPICAL

## 2021-12-14 MED ORDER — INSULIN ASPART 100 UNIT/ML IJ SOLN
0.0000 [IU] | Freq: Every day | INTRAMUSCULAR | Status: DC
  Administered 2021-12-14 – 2021-12-19 (×6): 3 [IU] via SUBCUTANEOUS
  Administered 2021-12-20: 4 [IU] via SUBCUTANEOUS

## 2021-12-14 MED ORDER — ONDANSETRON HCL 4 MG PO TABS: 4.0000 mg | ORAL_TABLET | Freq: Three times a day (TID) | ORAL | Status: DC | PRN

## 2021-12-14 MED ORDER — SODIUM CHLORIDE 0.9% FLUSH
3.0000 mL | Freq: Two times a day (BID) | INTRAVENOUS | Status: DC
  Administered 2021-12-14 – 2021-12-21 (×13): 3 mL via INTRAVENOUS

## 2021-12-14 MED ORDER — SODIUM BICARBONATE 650 MG PO TABS
1300.0000 mg | ORAL_TABLET | Freq: Two times a day (BID) | ORAL | Status: DC
  Administered 2021-12-14: 1300 mg via ORAL
  Filled 2021-12-14: qty 2

## 2021-12-14 MED ORDER — TACROLIMUS 0.5 MG PO CAPS
1.0000 mg | ORAL_CAPSULE | Freq: Two times a day (BID) | ORAL | Status: DC
  Administered 2021-12-14 – 2021-12-21 (×14): 1 mg via ORAL
  Filled 2021-12-14: qty 2
  Filled 2021-12-14: qty 1
  Filled 2021-12-14 (×4): qty 2
  Filled 2021-12-14 (×2): qty 1
  Filled 2021-12-14: qty 2
  Filled 2021-12-14 (×2): qty 1
  Filled 2021-12-14 (×3): qty 2
  Filled 2021-12-14 (×3): qty 1
  Filled 2021-12-14: qty 2

## 2021-12-14 MED ORDER — DAPAGLIFLOZIN PROPANEDIOL 10 MG PO TABS: 10.0000 mg | ORAL_TABLET | Freq: Every day | ORAL | Status: DC

## 2021-12-14 MED ORDER — PREDNISONE 10 MG PO TABS
5.0000 mg | ORAL_TABLET | Freq: Every day | ORAL | Status: DC
  Administered 2021-12-15 – 2021-12-21 (×7): 5 mg via ORAL
  Filled 2021-12-14 (×7): qty 1

## 2021-12-14 NOTE — ED Provider Notes (Signed)
[facility] EMERGENCY DEPARTMENT Provider Note   CSN: [id number] Arrival date & time: 12/14/21  9935     History  Chief Complaint  Patient presents with   Weakness    Daniel Myers is a 76 y.o. male Chronically ill with increased weakness 3-4 days. More increasing weakness since ACS ~87monthago. Has had multiple falls, endorses mechanical 2/2 leg weakness. No LOC, no head injury. Bruising and laceration on foot. Bruising and scabs on both arms, skin tears right elbow from fall last night. Increased tiredness, 3-4 hours of naps + all night since NSTEMI. Placed on Lasix, no longer on Spiro, taking elemental potassium since then. Gnawing stomach pain intermittently, has been taking iron supplementation, and seems associated, no pain at this time. Some diarrhea. Fairly somnolent on interview but responsive, diffuse rhonchi, breathing shallow. Pitting edema to the knee, ~2+. Looks improved but not resolved compared to recent hospitalization per daughters.  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 Initially saw Dr. FTrinna Post   Weakness Associated symptoms: shortness of breath        Home Medications Prior to Admission medications   Medication Sig Start Date End Date Taking? Authorizing Provider  acetaminophen (TYLENOL) 500 MG tablet Take 500 mg by mouth every 8 (eight) hours as needed for moderate pain.   Yes [provider]  albuterol (VENTOLIN HFA) 108 (90 Base) MCG/ACT inhaler Inhale 2 puffs into the lungs every 6 (six) hours as needed for wheezing or shortness of breath. 11/13/21  Yes Stacks, WCletus Gash MD  ALPRAZolam (Duanne Moron 0.25 MG tablet Take 1 tablet (0.25 mg total) by mouth at bedtime as needed for anxiety. 08/05/21  Yes Stacks, WCletus Gash MD  amiodarone (PACERONE) 200 MG tablet Take 0.5 tablets (100 mg total) by mouth daily. 10/14/21  Yes MLarey Dresser MD  atorvastatin (LIPITOR) 40 MG tablet Take 1 tablet (40 mg total) by mouth  daily. 11/10/21  Yes SClaretta Fraise MD  dapagliflozin propanediol (FARXIGA) 10 MG TABS tablet Take 1 tablet (10 mg total) by mouth daily before breakfast. 02/25/21  Yes Stacks, WCletus Gash MD  ELIQUIS 5 MG TABS tablet Take 1 tablet by mouth twice daily 11/09/21  Yes MLarey Dresser MD  ezetimibe (ZETIA) 10 MG tablet Take 1 tablet (10 mg total) by mouth daily. 08/17/21  Yes MSatira Sark MD  fluticasone (FLONASE) 50 MCG/ACT nasal spray USE ONE SPRAY(S) IN EACH NOSTRIL ONCE DAILY Patient taking differently: Place 1 spray into both nostrils daily as needed for allergies. 05/07/20  Yes SClaretta Fraise MD  furosemide (LASIX) 20 MG tablet Take 40 mg by mouth 2 (two) times daily.   Yes [provider]  insulin NPH Human (NOVOLIN N) 100 UNIT/ML injection 10 units AC breakfast and 10 units AC supper 08/05/21  Yes Stacks, WCletus Gash MD  insulin regular (NOVOLIN R) 100 units/mL injection 16 units w bkfst and 13 units w dinner 04/30/21  Yes Stacks, WCletus Gash MD  loratadine (CLARITIN) 10 MG tablet Take 10 mg by mouth daily as needed for allergies. 10/24/06  Yes [provider]  metoprolol succinate (TOPROL-XL) 100 MG 24 hr tablet Take 1 tablet (100 mg total) by mouth daily. Take with or immediately following a meal. 08/05/21  Yes Stacks, WCletus Gash MD  mycophenolate (CELLCEPT) 250 MG capsule Take 500 mg by mouth 2 (two) times daily. 06/10/14  Yes [provider]  ondansetron (ZOFRAN) 4 MG tablet Take 1 tablet (4 mg total) by mouth every 8 (  eight) hours as needed for nausea or vomiting. 04/17/20  Yes Stacks, [name], MD  predniSONE (DELTASONE) 5 MG tablet Take 5 mg by mouth daily with breakfast.   Yes [provider]  sodium bicarbonate 650 MG tablet Take 1,300 mg by mouth 2 (two) times daily. 09/26/18  Yes [provider]  sodium zirconium cyclosilicate (LOKELMA) 10 g PACK packet Take 10 g by mouth as directed. By the HF clinic Patient taking differently: Take 10 g by mouth as  directed. By the HF clinic just as needed 10/27/21  Yes Larey Dresser, MD  sulfamethoxazole-trimethoprim (BACTRIM) 400-80 MG tablet Take 1 tablet by mouth every Monday, Wednesday, and Friday.   Yes [provider]  tacrolimus (PROGRAF) 0.5 MG capsule Take 1 mg by mouth in the morning and at bedtime. 07/08/15  Yes [provider]  Vericiguat (VERQUVO) 2.5 MG TABS Take 2.5 mg by mouth daily. 12/01/21  Yes Larey Dresser, MD  nitroGLYCERIN (NITROSTAT) 0.4 MG SL tablet Place 1 tablet (0.4 mg total) under the tongue every 5 (five) minutes as needed for chest pain. 11/05/20   Larey Dresser, MD  spironolactone (ALDACTONE) 25 MG tablet Take 1 tablet (25 mg total) by mouth daily. Patient not taking: Reported on 12/14/2021 12/02/21   Claretta Fraise, MD      Allergies    Tape, Trazodone and nefazodone, Mirtazapine, Elemental sulfur, and Sulfa antibiotics    Review of Systems   Review of Systems  Respiratory:  Positive for shortness of breath.   Neurological:  Positive for weakness.  All other systems reviewed and are negative.   Physical Exam Updated Vital Signs BP (!) 120/50 (BP Location: Left Arm)   Pulse 63   Temp 98.3 F (36.8 C) (Oral)   Resp 16   SpO2 97%  Physical Exam Vitals and nursing note reviewed.  Constitutional:      General: He is not in acute distress.    Appearance: Normal appearance. He is ill-appearing.  HENT:     Head: Normocephalic and atraumatic.  Eyes:     General:        Right eye: No discharge.        Left eye: No discharge.  Cardiovascular:     Rate and Rhythm: Normal rate and regular rhythm.     Heart sounds: No murmur heard.    No friction rub. No gallop.  Pulmonary:     Effort: Pulmonary effort is normal.     Breath sounds: Normal breath sounds.     Comments: Some crackles at lung bases, ttp over lateral ribcage on left Abdominal:     General: Bowel sounds are normal.     Palpations: Abdomen is soft.  Skin:    General: Skin is  warm and dry.     Capillary Refill: Capillary refill takes less than 2 seconds.     Comments: Significant bruising over right foot, as well as right elbow  Neurological:     Mental Status: He is alert and oriented to person, place, and time.  Psychiatric:        Mood and Affect: Mood normal.        Behavior: Behavior normal.     ED Results / Procedures / Treatments   Labs (all labs ordered are listed, but only abnormal results are displayed) Labs Reviewed  CBC - Abnormal; Notable for the following components:      Result Value   RBC 2.68 (*)    Hemoglobin 7.4 (*)  HCT 25.3 (*)    MCHC 29.2 (*)    RDW 18.4 (*)    Platelets 72 (*)    All other components within normal limits  BRAIN NATRIURETIC PEPTIDE - Abnormal; Notable for the following components:   B Natriuretic Peptide >4,500.0 (*)    All other components within normal limits  COMPREHENSIVE METABOLIC PANEL - Abnormal; Notable for the following components:   Sodium 134 (*)    Potassium 5.9 (*)    Glucose, Bld 321 (*)    BUN 73 (*)    Creatinine, Ser 3.77 (*)    Calcium 7.9 (*)    Total Protein 5.4 (*)    Albumin 3.1 (*)    Total Bilirubin 1.5 (*)    GFR, Estimated 16 (*)    All other components within normal limits  GLUCOSE, CAPILLARY - Abnormal; Notable for the following components:   Glucose-Capillary 271 (*)    All other components within normal limits  TROPONIN I (HIGH SENSITIVITY) - Abnormal; Notable for the following components:   Troponin I (High Sensitivity) 127 (*)    All other components within normal limits  TROPONIN I (HIGH SENSITIVITY) - Abnormal; Notable for the following components:   Troponin I (High Sensitivity) 130 (*)    All other components within normal limits  RESP PANEL BY RT-PCR (FLU A&B, COVID) ARPGX2  MRSA NEXT GEN BY PCR, NASAL  HEMOGLOBIN A1C  MAGNESIUM  COMPREHENSIVE METABOLIC PANEL  CBC    EKG None  Radiology US Renal Transplant w/Doppler  Result Date: 12/14/2021 CLINICAL  DATA:  Worsening kidney function.  Evaluate renal graft EXAM: ULTRASOUND OF RENAL TRANSPLANT WITH RENAL DOPPLER ULTRASOUND TECHNIQUE: Ultrasound examination of the renal transplant was performed with gray-scale, color and duplex doppler evaluation. COMPARISON:  11/19/2021 FINDINGS: Transplant kidney location: Right lower quadrant Transplant Kidney: Renal measurements: 9.8 x 6.2 x 6.8 cm = volume: 214.93m. Normal in size and parenchymal echogenicity. No evidence of hydronephrosis. No peri-transplant fluid collection seen. Well-circumscribed anechoic cyst is identified measuring 3.2 x 1.8 x 2.6 cm. Similar to previous exam. Color flow in the main renal artery:  Yes Color flow in the main renal vein:  Yes Duplex Doppler Evaluation: Main Renal Artery Velocity: 93.1 cm/sec Main Renal Artery Resistive Index: 0.86 Venous waveform in main renal vein:  Present Intrarenal resistive index in upper pole:  0.78 (normal 0.6-0.8; equivocal 0.8-0.9; abnormal >= 0.9) Intrarenal resistive index in lower pole: 0.73 (normal 0.6-0.8; equivocal 0.8-0.9; abnormal >= 0.9) Bladder: Bladder is fully distended and appears normal. Ureteral jets not visualized however. Other findings: None. IMPRESSION: 1. Patent renal vasculature. The intra renal resistive indices in the upper and lower pole are within normal limits. 2. No hydronephrosis. Electronically Signed   By: TKerby MoorsM.D.   On: 12/14/2021 13:10   DG Chest 1 View  Result Date: 12/14/2021 CLINICAL DATA:  Multiple falls. EXAM: CHEST  1 VIEW COMPARISON:  November 16, 2021. FINDINGS: Stable cardiomediastinal silhouette. Both lungs are clear. Mildly displaced left sixth, seventh and eighth rib fractures. IMPRESSION: Mildly displaced left rib fractures as described above. No acute cardiopulmonary abnormality seen. Electronically Signed   By: JMarijo ConceptionM.D.   On: 12/14/2021 11:43   DG Foot Complete Right  Result Date: 12/14/2021 CLINICAL DATA:  Multiple falls. EXAM: RIGHT FOOT  COMPLETE - 3+ VIEW COMPARISON:  None Available. FINDINGS: There is no evidence of fracture or dislocation. There is no evidence of arthropathy. Mild posterior calcaneal spurring is noted. Vascular calcifications are  noted. IMPRESSION: No acute abnormality is noted. Electronically Signed   By: Marijo Conception M.D.   On: 12/14/2021 11:40   DG Elbow Complete Right  Result Date: 12/14/2021 CLINICAL DATA:  Right elbow pain after fall. EXAM: RIGHT ELBOW - COMPLETE 3+ VIEW COMPARISON:  None Available. FINDINGS: There is no evidence of fracture, dislocation, or joint effusion. There is no evidence of arthropathy or other focal bone abnormality. Soft tissues are unremarkable. IMPRESSION: Negative. Electronically Signed   By: Marijo Conception M.D.   On: 12/14/2021 11:39   DG HIPS BILAT WITH PELVIS MIN 5 VIEWS  Result Date: 12/14/2021 CLINICAL DATA:  Multiple falls. EXAM: DG HIP (WITH OR WITHOUT PELVIS) 5+V BILAT COMPARISON:  October 29, 2021. FINDINGS: There is no evidence of hip fracture or dislocation. Mild to moderate osteophyte formation is seen involving both hips. 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 No acute abnormality seen. Electronically Signed   By: Marijo Conception M.D.   On: 12/14/2021 11:38    Procedures Procedures    Medications Ordered in ED Medications  sodium zirconium cyclosilicate (LOKELMA) packet 10 g (10 g Oral Given 12/14/21 1331)  amiodarone (PACERONE) tablet 100 mg (has no administration in time range)  atorvastatin (LIPITOR) tablet 40 mg (has no administration in time range)  ezetimibe (ZETIA) tablet 10 mg (has no administration in time range)  Vericiguat TABS 2.5 mg (has no administration in time range)  ALPRAZolam (XANAX) tablet 0.25 mg (has no administration in time range)  predniSONE (DELTASONE) tablet 5 mg (has no administration in time range)  sodium bicarbonate tablet 1,300 mg (has no administration in time range)  apixaban  (ELIQUIS) tablet 5 mg (has no administration in time range)  mycophenolate (CELLCEPT) capsule 500 mg (has no administration in time range)  tacrolimus (PROGRAF) capsule 1 mg (has no administration in time range)  fluticasone (FLONASE) 50 MCG/ACT nasal spray 1 spray (has no administration in time range)  loratadine (CLARITIN) tablet 10 mg (has no administration in time range)  sodium chloride flush (NS) 0.9 % injection 3 mL (3 mLs Intravenous Given 12/14/21 1637)  sodium chloride flush (NS) 0.9 % injection 3 mL (has no administration in time range)  0.9 %  sodium chloride infusion (has no administration in time range)  acetaminophen (TYLENOL) tablet 650 mg (has no administration in time range)    Or  acetaminophen (TYLENOL) suppository 650 mg (has no administration in time range)  ondansetron (ZOFRAN) tablet 4 mg (has no administration in time range)    Or  ondansetron (ZOFRAN) injection 4 mg (has no administration in time range)  insulin aspart (novoLOG) injection 0-9 Units (5 Units Subcutaneous Given 12/14/21 1700)  insulin aspart (novoLOG) injection 0-5 Units (has no administration in time range)  albuterol (PROVENTIL) (2.5 MG/3ML) 0.083% nebulizer solution 2.5 mg (has no administration in time range)  insulin NPH Human (NOVOLIN N) injection 10 Units (has no administration in time range)  Chlorhexidine Gluconate Cloth 2 % PADS 6 each (6 each Topical Given 12/14/21 1700)  calcium gluconate 1 g/ 50 mL sodium chloride IVPB (0 mg Intravenous Stopped 12/14/21 1433)  furosemide (LASIX) injection 80 mg (80 mg Intravenous Given 12/14/21 1458)    ED Course/ Medical Decision Making/ A&P Clinical Course as of 12/14/21 1900  Mon Dec 14, 2021  1024 Chronically ill with increased weakness 3-4 days. More increasing weakness since ACS ~48monthago. Has had multiple falls, endorses mechanical 2/2 leg weakness. No LOC,  no head injury. Bruising and laceration on foot. Bruising and scabs on both arms. Increased  tiredness, 3-4 hours of naps + all night since NSTEMI. Placed on Lasix, no longer on Spiro, taking elemental potassium since then. Gnawing stomach pain intermittently, has been taking iron supplementation, and seems associated. Some diarrhea. Fairly somnolent on interview, diffuse rhonchi, breathing shallow. Pitting edema to the knee, ~2+. Looks improved but not resolved.   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  [CP]  1137 Creatinine(!): 3.77 Worsening acute on chronic kidney disease, hyperkalemia today, with elevated BUN, his troponin is elevated, but significantly decreased from recent hospitalization, as he is not currently having active chest pain I think that his body is slowly clearing the elevated troponin from his fairly recent NSTEMI, his BNP is again greater than 4500, with signs of fluid overload, patient is somewhat complicated given his history of kidney transplant, kidney disease, and signs of fluid overload, he is showing signs of need for rehydration, potassium control [CP]  1348 [place] [CP]  8119 Will consult cards given recent NSTEMI, heart failure [CP]  1355 [phone] -- Pratik Shah. Will consult with cardiology first then admit. [CP]    Clinical Course User Index [CP] Anselmo Pickler, PA-C                           Medical Decision Making Amount and/or Complexity of Data Reviewed Labs: ordered. Decision-making details documented in ED Course. Radiology: ordered.  Risk Prescription drug management. Decision regarding hospitalization.   This patient is a 76 y.o. male who presents to the ED for concern of worsening weakness, frequent falls, bruising, general decline of health in context of recent admission for NSTEMI without PCI, this involves an extensive number of treatment options, and is a complaint that carries with it a high risk of complications and morbidity. The emergent differential diagnosis prior to  evaluation includes, but is not limited to,  worsening kidney function, critical anemia, heart failure exacerbation, new NSTEMI, dehydration, malnutrition, general decline versus other.   This is not an exhaustive differential.   Past Medical History / Co-morbidities / Social History: With a history of remote kidney transplant 15 years ago, with worsening CKD over the last several months, he had an NSTEMI 1 month ago without PCI, history of hypertension, hyperlipidemia diabetes, A-fib  Additional history: Chart reviewed. Pertinent results include: extensively reviewed lab work, imaging from his recent previous emergency department visits, hospitalization.  Physical Exam: Physical exam performed. The pertinent findings include: Chronically ill-appearing, bruising noted on right foot, right elbow, left lateral rib cage, some crackles at lung bases, general weakness, somnolence  Lab Tests: I ordered, and personally interpreted labs.  The pertinent results include: CMP is notable for hyperkalemia, potassium 5.9, his hyperglycemia glucose 321, evidence of AKI on top of chronic kidney disease, creatinine 3.77 which is creeping up from creatinine of around 2 a month ago, but most recently around 3.2, BUN elevated at 73, additionally he was anemic with hemoglobin of 7.4, will likely need transfusion, after diuresis.  Notably with elevated troponin, however considering he had an NSTEMI just 1 month ago without PCI it is significantly decreased from recent baseline, as well as since he has abnormal creatinine and other clearance with kidney dysfunction he may not be clearing his troponin rapidly.  His BNP is greater than 4500.   Imaging Studies: I ordered imaging studies including plain  film chest x-ray, inform right foot, right elbow, bilateral hips, renal ultrasound. I independently visualized and interpreted imaging which showed Mildly displaced left rib fractures, some degenerative changes noted  throughout, but no other acute fractures, dislocations, no pneumothorax, renal ultrasound does not show any evidence of acute rejection. I agree with the radiologist interpretation.   Medications: I ordered medication including Lasix for diuresis, calcium gluconate for stabilization of the cardiac membranes in context of hypocalcemia and hyperkalemia, Lokelma for hyperkalemia.  Patient will require reevaluation for assessment of his response to treatment.  Consultations Obtained: I requested consultation with the nephrologist, spoke with Dr. Joelyn Oms, they would recommend [facility] alone for his hyperkalemia at this time, no other acute recommendations with remote transplant, spoke with Dr. Dellia Cloud with cardiology who feels comfortable with this patient being managed at any pain at this time, spoke with Dr. Manuella Ghazi with hospital service,  and discussed lab and imaging findings as well as pertinent plan - they recommend: admission as above   Disposition: After consideration of the diagnostic results and the patients response to treatment, I feel that patient would benefit from admission .   I discussed this case with my attending physician Dr. Doren Custard who cosigned this note including patient's presenting symptoms, physical exam, and planned diagnostics and interventions. Attending physician stated agreement with plan or made changes to plan which were implemented.    Final Clinical Impression(s) / ED Diagnoses Final diagnoses:  Acute on chronic congestive heart failure, unspecified heart failure type [facility])    Rx / DC Orders ED Discharge Orders     None         Dorien Chihuahua 12/14/21 1900    [place], [place], MD 12/15/21 0725

## 2021-12-14 NOTE — H&P (Addendum)
History and Physical    Daniel Myers JAS:[id number] DOB: Jan 13, 1946 DOA: 12/14/2021  PCP: Claretta Fraise, MD   Patient coming from: Home  Chief Complaint: Weakness/falls  HPI: Daniel Myers is a 76 y.o. 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  He endorses lower extremity weakness and denies any loss of consciousness or head injury.  He is noted to have some bruising and lacerations on his extremities and notes increasing tiredness.  Much of this appears to have started since his NSTEMI approximately 1 month ago.  He has been taking Lasix as well as potassium since then and has had some intermittent gastrointestinal symptoms of epigastric abdominal pain as well as some mild diarrhea.  He does to have some pitting edema to bilateral knees.   ED Course: Vital signs stable and patient is afebrile.  He is noted to have hemoglobin 7.4 and platelet count of 72,000.  Potassium 5.9 in the setting of creatinine 3.77 with baseline near 2 from several weeks to months ago.  Troponin 127 and glucose 321.  He is noted to have some left-sided rib fractures likely related to the fall.  He has been given Lokelma and calcium gluconate per nephrology recommendations in the ED.  Review of Systems: Reviewed as noted above, otherwise negative.  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   nodular on left jawline - excision   Basal cell carcinoma 07/01/2014   nodular on left hawling - CX3+5FU+excision   Basal cell carcinoma 10/10/2014   nodular on right forearm, middle - tx p bx   Basal cell carcinoma 02/11/2015   left neck - CX3 + excision   Basal cell carcinoma 06/14/2016    left jawline - CX3+5FU   Basal cell carcinoma 02/22/2017   superficial and nodular on left neck - excision   Basal cell carcinoma 04/11/2018   superficial and nodular on right inferior forearm - CX3+Cautery+5FU   Chronic kidney disease    History of renal transplant    Hyperlipidemia    Hypertension    NSTEMI (non-ST elevated myocardial infarction) ([place]) 02/13/2020   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   Left [place] Cheek (well diff)   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   Right Breast (in situ)   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   Left Forearm - anterior (mod diff)   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   Neck - anterior (mod diff)   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   in situ on left arm - CX3+5FU   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   hypertrophic on left ear - CX3+5FU   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   in situ on right temple - CX3+5FU   Squamous cell carcinoma of skin 11/08/2011   right upper forearm - tx p bx   Squamous cell carcinoma of skin 11/08/2011   left upper forearm - tx p bx   Squamous cell carcinoma of skin 11/08/2011   left hand - tx p bx   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/27/2013   in situ on left lower back - CX3+5FU   Squamous cell carcinoma of skin  06/27/2013   in situ on left forehead - CX3+5FU   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/27/2013   in situ on right temple - watch per ST   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/27/2013   in situ on right forearm - CX3+5FU   Squamous cell carcinoma of skin 07/01/2014   well differentiated on right sideburn - CX3+5FU   Squamous cell carcinoma of skin 07/01/2014   in situ on left shoulder - CX3+5FU   Squamous cell carcinoma of skin 07/01/2014   in situ on right forearm, proximal - CX3+5FU+Cautery   Squamous cell carcinoma of skin 07/01/2014   in situ on right forearm, distal - CX3+5FU   Squamous cell carcinoma of skin  09/30/2015   in situ on posterior left ear - CX3+5FU   Squamous cell carcinoma of skin 02/22/2017   in situ on right upper arm - CX3+5FU   Squamous cell carcinoma of skin 02/22/2017   in situ on left upper arm - CX3+5FU   Squamous cell carcinoma of skin 04/11/2018   in situ on right temple - CX3+5FU   Squamous cell carcinoma of skin 04/11/2018   in situ on lateral right arm - MOHs   Squamous cell carcinoma of skin 04/11/2018   in situ on right upper arm - MOHs   Squamous cell carcinoma of skin 04/11/2018   in situ on left sideburn   Squamous cell carcinoma of skin 04/11/2018   in situ on right flank - tx p bx   Squamous cell carcinoma of skin 09/28/2018   in situ on right inner ear - tx p bx   Squamous cell carcinoma of skin 09/28/2018   in situ on left inner ear - tx p bx   Squamous cell carcinoma of skin 09/28/2018   in situ on right arm - tx p bx   Squamous cell carcinoma of skin 03/21/2019   in situ on right antihelix (Mitkov treated topically)   Squamous cell carcinoma of skin 03/21/2019   in situ on right neck - CX3+5FU   Squamous cell carcinoma of skin 03/21/2019   in situ on left outer eye, inf (MOHs done 05/23/2019)   Type 2 diabetes mellitus ([place])    Unspecified atrial fibrillation ([place]) 02/17/2020    Past Surgical History:  Procedure Laterality Date   CARDIOVERSION N/A 10/16/2020   Procedure: CARDIOVERSION;  Surgeon: Larey Dresser, MD;  Location: [facility] ENDOSCOPY;  Service: Cardiovascular;  Laterality: N/A;   KIDNEY TRANSPLANT Right    RIGHT/LEFT HEART CATH AND CORONARY ANGIOGRAPHY N/A 09/09/2020   Procedure: RIGHT/LEFT HEART CATH AND CORONARY ANGIOGRAPHY;  Surgeon: Larey Dresser, MD;  Location: [facility] CV LAB;  Service: Cardiovascular;  Laterality: N/A;     reports that he has been smoking pipe. He has never used smokeless tobacco. He reports that he does not drink alcohol and does not use drugs.  Allergies  Allergen Reactions   Tape Rash    Use paper tape.     Trazodone And Nefazodone Palpitations    tachycardia   Mirtazapine     imbalance   Elemental Sulfur Rash   Sulfa Antibiotics Rash    Family History  Problem Relation Age of Onset   Clotting disorder Mother    Hypertension Sister    Diabetes Sister     Prior to Admission medications   Medication Sig Start Date End Date Taking? Authorizing Provider  acetaminophen (TYLENOL) 500 MG tablet Take 500 mg by mouth every 8 (eight) hours  as needed for moderate pain.   Yes [provider]  albuterol (VENTOLIN HFA) 108 (90 Base) MCG/ACT inhaler Inhale 2 puffs into the lungs every 6 (six) hours as needed for wheezing or shortness of breath. 11/13/21  Yes Stacks, [name], MD  ALPRAZolam Duanne Moron) 0.25 MG tablet Take 1 tablet (0.25 mg total) by mouth at bedtime as needed for anxiety. 08/05/21  Yes Stacks, [name], MD  amiodarone (PACERONE) 200 MG tablet Take 0.5 tablets (100 mg total) by mouth daily. 10/14/21  Yes Larey Dresser, MD  atorvastatin (LIPITOR) 40 MG tablet Take 1 tablet (40 mg total) by mouth daily. 11/10/21  Yes Claretta Fraise, MD  dapagliflozin propanediol (FARXIGA) 10 MG TABS tablet Take 1 tablet (10 mg total) by mouth daily before breakfast. 02/25/21  Yes Stacks, [name], MD  ELIQUIS 5 MG TABS tablet Take 1 tablet by mouth twice daily 11/09/21  Yes Larey Dresser, MD  ezetimibe (ZETIA) 10 MG tablet Take 1 tablet (10 mg total) by mouth daily. 08/17/21  Yes Satira Sark, MD  fluticasone (FLONASE) 50 MCG/ACT nasal spray USE ONE SPRAY(S) IN EACH NOSTRIL ONCE DAILY Patient taking differently: Place 1 spray into both nostrils daily as needed for allergies. 05/07/20  Yes Claretta Fraise, MD  furosemide (LASIX) 20 MG tablet Take 40 mg by mouth 2 (two) times daily.   Yes [provider]  insulin NPH Human (NOVOLIN N) 100 UNIT/ML injection 10 units AC breakfast and 10 units AC supper 08/05/21  Yes Stacks, [name], MD  insulin regular (NOVOLIN R) 100 units/mL injection 16 units w  bkfst and 13 units w dinner 04/30/21  Yes Stacks, [name], MD  loratadine (CLARITIN) 10 MG tablet Take 10 mg by mouth daily as needed for allergies. 10/24/06  Yes [provider]  metoprolol succinate (TOPROL-XL) 100 MG 24 hr tablet Take 1 tablet (100 mg total) by mouth daily. Take with or immediately following a meal. 08/05/21  Yes Stacks, [name], MD  mycophenolate (CELLCEPT) 250 MG capsule Take 500 mg by mouth 2 (two) times daily. 06/10/14  Yes [provider]  ondansetron (ZOFRAN) 4 MG tablet Take 1 tablet (4 mg total) by mouth every 8 (eight) hours as needed for nausea or vomiting. 04/17/20  Yes Stacks, [name], MD  predniSONE (DELTASONE) 5 MG tablet Take 5 mg by mouth daily with breakfast.   Yes [provider]  sodium bicarbonate 650 MG tablet Take 1,300 mg by mouth 2 (two) times daily. 09/26/18  Yes [provider]  sodium zirconium cyclosilicate (LOKELMA) 10 g PACK packet Take 10 g by mouth as directed. By the HF clinic Patient taking differently: Take 10 g by mouth as directed. By the HF clinic just as needed 10/27/21  Yes Larey Dresser, MD  sulfamethoxazole-trimethoprim (BACTRIM) 400-80 MG tablet Take 1 tablet by mouth every Monday, Wednesday, and Friday.   Yes [provider]  tacrolimus (PROGRAF) 0.5 MG capsule Take 1 mg by mouth in the morning and at bedtime. 07/08/15  Yes [provider]  Vericiguat (VERQUVO) 2.5 MG TABS Take 2.5 mg by mouth daily. 12/01/21  Yes Larey Dresser, MD  nitroGLYCERIN (NITROSTAT) 0.4 MG SL tablet Place 1 tablet (0.4 mg total) under the tongue every 5 (five) minutes as needed for chest pain. 11/05/20   Larey Dresser, MD  spironolactone (ALDACTONE) 25 MG tablet Take 1 tablet (25 mg total) by mouth daily. Patient not taking: Reported on 12/14/2021 12/02/21   Claretta Fraise, MD    Physical  Exam: Vitals:   12/14/21 1030 12/14/21 1100 12/14/21 1200 12/14/21 1430  BP: (!) 128/53 (!) 123/59 118/62 119/62  Pulse:  64 66 67 (!) 57  Resp: (!) 28 (!) 22 (!) 24 20  Temp:   98.5 F (36.9 C) 98.5 F (36.9 C)  TempSrc:   Oral Oral  SpO2: 93% 96% 96% 96%    Constitutional: NAD, calm, comfortable, obese Vitals:   12/14/21 1030 12/14/21 1100 12/14/21 1200 12/14/21 1430  BP: (!) 128/53 (!) 123/59 118/62 119/62  Pulse: 64 66 67 (!) 57  Resp: (!) 28 (!) 22 (!) 24 20  Temp:   98.5 F (36.9 C) 98.5 F (36.9 C)  TempSrc:   Oral Oral  SpO2: 93% 96% 96% 96%   Eyes: lids and conjunctivae normal Neck: normal, supple Respiratory: clear to auscultation bilaterally. Normal respiratory effort. No accessory muscle use.  Cardiovascular: Regular rate and rhythm, no murmurs. Abdomen: no tenderness, no distention. Bowel sounds positive.  Musculoskeletal: 1-2+ pitting edema to bilateral knees Skin: no rashes, lesions, ulcers.  Psychiatric: Flat affect  Labs on Admission: I have personally reviewed following labs and imaging studies  CBC: Recent Labs  Lab 12/14/21 1101  WBC 4.3  HGB 7.4*  HCT 25.3*  MCV 94.4  PLT 72*   Basic Metabolic Panel: Recent Labs  Lab 12/09/21 1016 12/14/21 1101  NA 137 134*  K 4.7 5.9*  CL 102 103  CO2 26 23  GLUCOSE 157* 321*  BUN 65* 73*  CREATININE 3.32* 3.77*  CALCIUM 7.9* 7.9*  MG 3.2*  --    GFR: Estimated Creatinine Clearance: 20.3 mL/min (A) (by C-G formula based on SCr of 3.77 mg/dL (H)). Liver Function Tests: Recent Labs  Lab 12/14/21 1101  AST 16  ALT 15  ALKPHOS 74  BILITOT 1.5*  PROT 5.4*  ALBUMIN 3.1*   No results for input(s): "LIPASE", "AMYLASE" in the last 168 hours. No results for input(s): "AMMONIA" in the last 168 hours. Coagulation Profile: No results for input(s): "INR", "PROTIME" in the last 168 hours. Cardiac Enzymes: No results for input(s): "CKTOTAL", "CKMB", "CKMBINDEX", "TROPONINI" in the last 168 hours. BNP (last 3 results) No results for input(s): "PROBNP" in the last 8760 hours. HbA1C: No results for input(s): "HGBA1C" in  the last 72 hours. CBG: No results for input(s): "GLUCAP" in the last 168 hours. Lipid Profile: No results for input(s): "CHOL", "HDL", "LDLCALC", "TRIG", "CHOLHDL", "LDLDIRECT" in the last 72 hours. Thyroid Function Tests: No results for input(s): "TSH", "T4TOTAL", "FREET4", "T3FREE", "THYROIDAB" in the last 72 hours. Anemia Panel: No results for input(s): "VITAMINB12", "FOLATE", "FERRITIN", "TIBC", "IRON", "RETICCTPCT" in the last 72 hours. Urine analysis:    Component Value Date/Time   COLORURINE YELLOW 11/19/2021 0849   APPEARANCEUR CLOUDY (A) 11/19/2021 0849   APPEARANCEUR Clear 04/17/2020 1629   LABSPEC 1.010 11/19/2021 0849   PHURINE 6.0 11/19/2021 0849   GLUCOSEU >=500 (A) 11/19/2021 0849   HGBUR MODERATE (A) 11/19/2021 0849   BILIRUBINUR NEGATIVE 11/19/2021 0849   BILIRUBINUR Negative 04/17/2020 [place] 11/19/2021 0849   PROTEINUR 100 (A) 11/19/2021 0849   NITRITE NEGATIVE 11/19/2021 0849   LEUKOCYTESUR LARGE (A) 11/19/2021 0849    Radiological Exams on Admission: US Renal Transplant w/Doppler  Result Date: 12/14/2021 CLINICAL DATA:  Worsening kidney function.  Evaluate renal graft EXAM: ULTRASOUND OF RENAL TRANSPLANT WITH RENAL DOPPLER ULTRASOUND TECHNIQUE: Ultrasound examination of the renal transplant was performed with gray-scale, color and duplex doppler evaluation. COMPARISON:  11/19/2021 FINDINGS:  Transplant kidney location: Right lower quadrant Transplant Kidney: Renal measurements: 9.8 x 6.2 x 6.8 cm = volume: 214.41m. Normal in size and parenchymal echogenicity. No evidence of hydronephrosis. No peri-transplant fluid collection seen. Well-circumscribed anechoic cyst is identified measuring 3.2 x 1.8 x 2.6 cm. Similar to previous exam. Color flow in the main renal artery:  Yes Color flow in the main renal vein:  Yes Duplex Doppler Evaluation: Main Renal Artery Velocity: 93.1 cm/sec Main Renal Artery Resistive Index: 0.86 Venous waveform in main renal  vein:  Present Intrarenal resistive index in upper pole:  0.78 (normal 0.6-0.8; equivocal 0.8-0.9; abnormal >= 0.9) Intrarenal resistive index in lower pole: 0.73 (normal 0.6-0.8; equivocal 0.8-0.9; abnormal >= 0.9) Bladder: Bladder is fully distended and appears normal. Ureteral jets not visualized however. Other findings: None. IMPRESSION: 1. Patent renal vasculature. The intra renal resistive indices in the upper and lower pole are within normal limits. 2. No hydronephrosis. Electronically Signed   By: TKerby MoorsM.D.   On: 12/14/2021 13:10   DG Chest 1 View  Result Date: 12/14/2021 CLINICAL DATA:  Multiple falls. EXAM: CHEST  1 VIEW COMPARISON:  November 16, 2021. FINDINGS: Stable cardiomediastinal silhouette. Both lungs are clear. Mildly displaced left sixth, seventh and eighth rib fractures. IMPRESSION: Mildly displaced left rib fractures as described above. No acute cardiopulmonary abnormality seen. Electronically Signed   By: JMarijo ConceptionM.D.   On: 12/14/2021 11:43   DG Foot Complete Right  Result Date: 12/14/2021 CLINICAL DATA:  Multiple falls. EXAM: RIGHT FOOT COMPLETE - 3+ VIEW COMPARISON:  None Available. FINDINGS: There is no evidence of fracture or dislocation. There is no evidence of arthropathy. Mild posterior calcaneal spurring is noted. Vascular calcifications are noted. IMPRESSION: No acute abnormality is noted. Electronically Signed   By: JMarijo ConceptionM.D.   On: 12/14/2021 11:40   DG Elbow Complete Right  Result Date: 12/14/2021 CLINICAL DATA:  Right elbow pain after fall. EXAM: RIGHT ELBOW - COMPLETE 3+ VIEW COMPARISON:  None Available. FINDINGS: There is no evidence of fracture, dislocation, or joint effusion. There is no evidence of arthropathy or other focal bone abnormality. Soft tissues are unremarkable. IMPRESSION: Negative. Electronically Signed   By: JMarijo ConceptionM.D.   On: 12/14/2021 11:39   DG HIPS BILAT WITH PELVIS MIN 5 VIEWS  Result Date:  12/14/2021 CLINICAL DATA:  Multiple falls. EXAM: DG HIP (WITH OR WITHOUT PELVIS) 5+V BILAT COMPARISON:  October 29, 2021. FINDINGS: There is no evidence of hip fracture or dislocation. Mild to moderate osteophyte formation is seen involving both hips. 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 No acute abnormality seen. Electronically Signed   By: JMarijo ConceptionM.D.   On: 12/14/2021 11:38     Assessment/Plan Principal Problem:   Weakness Active Problems:   NSTEMI (non-ST elevated myocardial infarction) (HCC)   CAD (coronary artery disease)/Prior MI   Insulin dependent type 2 diabetes mellitus (HCC)   CKD (chronic kidney disease) stage 4, GFR 15-29 ml/min (HCC)   Obesity (BMI 30-39.9)   Essential hypertension   Mixed hyperlipidemia   Thrombocytopenia ([facility])   C[place] ([place]) [place]   Diabetic nephropathy associated with type 2 diabetes mellitus ([place]   [place]osuppression ([place]   Long-term use of immunosuppressant medication   DNR (do not resuscitate)    Generalized weakness with multiple falls and noted old left-sided rib fractures -Noted overall to have physical deconditioning and appreciate PT evaluation with fall precautions -  Multifactorial in the setting of CHF exacerbation as well as AKI  AKI on CKD stage IV with prior history of transplant -Baseline creatinine of approximately 2 and is currently 3.77 -Appreciate nephrology consultation while inpatient -Usually follows with nephrologist Dr. Joseph Berkshire -Spironolactone and ACE inhibitors discontinued recently  Acute on chronic systolic/diastolic CHF -Continue home beta-blockers and Farxiga along with vericiguat -Patient not interested in ICD per prior note -Start IV Lasix per cardiology -Monitor strict I's and O's and daily weights  Hyperkalemia -Likely related to AKI above -Appreciate nephrology recommendations for [facility] and calcium gluconate for stabilization -Repeat  a.m. labs and monitor on telemetry  CAD with prior NSTEMI -Appreciate cardiology evaluation -Patient does not want any further invasive interventions from prior note from heart failure team 11/1 -Continue atorvastatin and Eliquis  Insulin-dependent diabetes with hyperglycemia -Carb modified diet and SSI while inpatient as well as continuation of home insulin -Currently on Farxiga as noted above -Recent hemoglobin A1c 10/23 6.3%  Atrial fibrillation with recent DCCV to NSR 9/22 -Currently on amiodarone and Eliquis -Monitor on telemetry  Hypertension -Currently controlled, continue medications as noted above  History of prior CVA -Likely in the context of atrial fibrillation -Continue anticoagulation with Eliquis  Chronic anemia -Likely related to CKD and patient is currently on Eliquis -Transfuse for hemoglobin less than 8, with anticipated transfusion in a.m. once further diuresed -Continue oral iron supplementation -May require Procrit/ESA  Thrombocytopenia -Continue to monitor on repeat CBC -Plan to hold Eliquis for platelet count less than 50,000  Obesity -BMI 32.43 -Lifestyle changes outpatient  DVT prophylaxis: Eliquis Code Status: DNR Family Communication: 2 daughters at bedside Disposition Plan: Admit for treatment of AKI with hyperkalemia and evaluate heart failure due to weakness.  PT evaluation for falls and likely need for placement. Consults called: Cardiology and nephrology Admission status: Inpatient, telemetry  Severity of Illness: The appropriate patient status for this patient is INPATIENT. Inpatient status is judged to be reasonable and necessary in order to provide the required intensity of service to ensure the patient's safety. The patient's presenting symptoms, physical exam findings, and initial radiographic and laboratory data in the context of their chronic comorbidities is felt to place them at high risk for further clinical deterioration.  Furthermore, it is not anticipated that the patient will be medically stable for discharge from the hospital within 2 midnights of admission.   * I certify that at the point of admission it is my clinical judgment that the patient will require inpatient hospital care spanning beyond 2 midnights from the point of admission due to high intensity of service, high risk for further deterioration and high frequency of surveillance required.*   Deaundra Dupriest D Joanie Duprey DO [facility]  If 7PM-7AM, please contact night-coverage [email or web address]  12/14/2021, 3:20 PM

## 2021-12-14 NOTE — ED Notes (Signed)
2 skin tears noted on R side of arm near where the IV is located. Pts R foot has a large bruise and abrasions--pt states this is also from a fall that occurred a while back

## 2021-12-14 NOTE — ED Notes (Signed)
Male pw applied to pt as he has had several falls recently. Per request of pt and family members at bedside, they would like the male pw to be changed frequently to prevent UTI

## 2021-12-14 NOTE — ED Triage Notes (Signed)
Pt reports weakness x 3-4 days. Pt also stating he has had multiple falls. Pt reports he increased his lasix to '80mg'$  and was told to take 39mq of potassium daily. Denies fevers.

## 2021-12-14 NOTE — Consult Note (Signed)
CARDIOLOGY CONSULT NOTE    Patient ID: Daniel Myers; [id number]; 03-19-1945   Admit date: 12/14/2021 Date of Consult: 12/14/2021  Primary Care Provider: Claretta Fraise, MD Primary Cardiologist: Loralie Champagne Primary Electrophysiologist:  None   Patient Profile:   Daniel Myers is a 76 y.o. 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  Patient was admitted to [facility] in 11/2021 with NSTEMI (high sensitive troponins peaked at 5000s).  Patient was deemed to be at a high risk for LAD CTO intervention in the setting of stage III CKD and medical management was recommended. Patient refused CABG and ICD. Patient presented to the ER with generalized weakness associated with LE swelling and abdominal distention. Daughter was at the bedside who stated he lost 10 pounds after his hospital discharge and he still has 10 more pounds to loose. Denied any angina, syncope, palpitations. Patient has been feeling extremely fatigued since his hospital discharge in October 2023 and has not been doing much at home. CXR showed mild pulmonary vascular congestion.  Labs are remarkable for BNP more than 4100, hemoglobin 7.4, platelets 72, potassium 5.9 creatinine 3.77, chloride 103, CO2 23.  Total bilirubin was elevated to 1.5 (it was 0.8 in October  2023).   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   nodular on left jawline - excision   Basal cell carcinoma 07/01/2014   nodular on left hawling - CX3+5FU+excision   Basal cell carcinoma 10/10/2014   nodular on right forearm, middle - tx p bx   Basal cell carcinoma 02/11/2015   left neck - CX3 + excision   Basal cell carcinoma 06/14/2016   left jawline - CX3+5FU   Basal cell carcinoma 02/22/2017   superficial and nodular on left neck - excision   Basal cell carcinoma 04/11/2018   superficial and nodular on right inferior forearm - CX3+Cautery+5FU   Chronic kidney disease    History of renal transplant    Hyperlipidemia    Hypertension    NSTEMI (non-ST elevated myocardial infarction) ([place]) 02/13/2020   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   Left [place] (well diff)   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   Right Breast (in situ)   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   Left Forearm - anterior (mod diff)   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   Neck - anterior (mod diff)   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   in situ on left arm - CX3+5FU   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   hypertrophic on left ear - CX3+5FU   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   in situ on right temple -  CX3+5FU   Squamous cell carcinoma of skin 11/08/2011   right upper forearm - tx p bx   Squamous cell carcinoma of skin 11/08/2011   left upper forearm - tx p bx   Squamous cell carcinoma of skin 11/08/2011   left hand - tx p bx   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/27/2013   in situ on left lower back - CX3+5FU   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/27/2013   in situ on left forehead - CX3+5FU   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/27/2013   in situ on right temple - watch per ST   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/27/2013   in situ on right forearm - CX3+5FU   Squamous cell carcinoma of  skin 07/01/2014   well differentiated on right sideburn - CX3+5FU   Squamous cell carcinoma of skin 07/01/2014   in situ on left shoulder - CX3+5FU   Squamous cell carcinoma of skin 07/01/2014   in situ on right forearm, proximal - CX3+5FU+Cautery   Squamous cell carcinoma of skin 07/01/2014   in situ on right forearm, distal - CX3+5FU   Squamous cell carcinoma of skin 09/30/2015   in situ on posterior left ear - CX3+5FU   Squamous cell carcinoma of skin 02/22/2017   in situ on right upper arm - CX3+5FU   Squamous cell carcinoma of skin 02/22/2017   in situ on left upper arm - CX3+5FU   Squamous cell carcinoma of skin 04/11/2018   in situ on right temple - CX3+5FU   Squamous cell carcinoma of skin 04/11/2018   in situ on lateral right arm - MOHs   Squamous cell carcinoma of skin 04/11/2018   in situ on right upper arm - MOHs   Squamous cell carcinoma of skin 04/11/2018   in situ on left sideburn   Squamous cell carcinoma of skin 04/11/2018   in situ on right flank - tx p bx   Squamous cell carcinoma of skin 09/28/2018   in situ on right inner ear - tx p bx   Squamous cell carcinoma of skin 09/28/2018   in situ on left inner ear - tx p bx   Squamous cell carcinoma of skin 09/28/2018   in situ on right arm - tx p bx   Squamous cell carcinoma of skin 03/21/2019   in situ on right antihelix ([place] treated topically)   Squamous cell carcinoma of skin 03/21/2019   in situ on right neck - CX3+5FU   Squamous cell carcinoma of skin 03/21/2019   in situ on left outer eye, inf (MOHs done 05/23/2019)   Type 2 diabetes mellitus ([place])    Unspecified atrial fibrillation ([place]) 02/17/2020    Past Surgical History:  Procedure Laterality Date   CARDIOVERSION N/A 10/16/2020   Procedure: CARDIOVERSION;  Surgeon: Larey Dresser, MD;  Location: [facility] ENDOSCOPY;  Service: Cardiovascular;  Laterality: N/A;   KIDNEY TRANSPLANT Right    RIGHT/LEFT HEART CATH AND CORONARY ANGIOGRAPHY N/A 09/09/2020    Procedure: RIGHT/LEFT HEART CATH AND CORONARY ANGIOGRAPHY;  Surgeon: Larey Dresser, MD;  Location: [facility] CV LAB;  Service: Cardiovascular;  Laterality: N/A;     Home Medications:  Prior to Admission medications   Medication Sig Start Date End Date Taking? Authorizing Provider  acetaminophen (TYLENOL) 500 MG tablet Take 500 mg by mouth every 8 (eight) hours as needed for moderate pain.   Yes [provider]  albuterol (VENTOLIN HFA) 108 (90 Base) MCG/ACT inhaler Inhale  2 puffs into the lungs every 6 (six) hours as needed for wheezing or shortness of breath. 11/13/21  Yes Stacks, [name], MD  ALPRAZolam Duanne Moron) 0.25 MG tablet Take 1 tablet (0.25 mg total) by mouth at bedtime as needed for anxiety. 08/05/21  Yes Stacks, [name], MD  amiodarone (PACERONE) 200 MG tablet Take 0.5 tablets (100 mg total) by mouth daily. 10/14/21  Yes Larey Dresser, MD  atorvastatin (LIPITOR) 40 MG tablet Take 1 tablet (40 mg total) by mouth daily. 11/10/21  Yes Claretta Fraise, MD  dapagliflozin propanediol (FARXIGA) 10 MG TABS tablet Take 1 tablet (10 mg total) by mouth daily before breakfast. 02/25/21  Yes Stacks, [name], MD  ELIQUIS 5 MG TABS tablet Take 1 tablet by mouth twice daily 11/09/21  Yes Larey Dresser, MD  ezetimibe (ZETIA) 10 MG tablet Take 1 tablet (10 mg total) by mouth daily. 08/17/21  Yes Satira Sark, MD  fluticasone (FLONASE) 50 MCG/ACT nasal spray USE ONE SPRAY(S) IN EACH NOSTRIL ONCE DAILY Patient taking differently: Place 1 spray into both nostrils daily as needed for allergies. 05/07/20  Yes Claretta Fraise, MD  furosemide (LASIX) 20 MG tablet Take 40 mg by mouth 2 (two) times daily.   Yes [provider]  insulin NPH Human (NOVOLIN N) 100 UNIT/ML injection 10 units AC breakfast and 10 units AC supper 08/05/21  Yes Stacks, [name], MD  insulin regular (NOVOLIN R) 100 units/mL injection 16 units w bkfst and 13 units w dinner 04/30/21  Yes Stacks, [name], MD  loratadine  (CLARITIN) 10 MG tablet Take 10 mg by mouth daily as needed for allergies. 10/24/06  Yes [provider]  metoprolol succinate (TOPROL-XL) 100 MG 24 hr tablet Take 1 tablet (100 mg total) by mouth daily. Take with or immediately following a meal. 08/05/21  Yes Stacks, [name], MD  mycophenolate (CELLCEPT) 250 MG capsule Take 500 mg by mouth 2 (two) times daily. 06/10/14  Yes [provider]  ondansetron (ZOFRAN) 4 MG tablet Take 1 tablet (4 mg total) by mouth every 8 (eight) hours as needed for nausea or vomiting. 04/17/20  Yes Stacks, [name], MD  predniSONE (DELTASONE) 5 MG tablet Take 5 mg by mouth daily with breakfast.   Yes [provider]  sodium bicarbonate 650 MG tablet Take 1,300 mg by mouth 2 (two) times daily. 09/26/18  Yes [provider]  sodium zirconium cyclosilicate (LOKELMA) 10 g PACK packet Take 10 g by mouth as directed. By the HF clinic Patient taking differently: Take 10 g by mouth as directed. By the HF clinic just as needed 10/27/21  Yes Larey Dresser, MD  sulfamethoxazole-trimethoprim (BACTRIM) 400-80 MG tablet Take 1 tablet by mouth every Monday, Wednesday, and Friday.   Yes [provider]  tacrolimus (PROGRAF) 0.5 MG capsule Take 1 mg by mouth in the morning and at bedtime. 07/08/15  Yes [provider]  Vericiguat (VERQUVO) 2.5 MG TABS Take 2.5 mg by mouth daily. 12/01/21  Yes Larey Dresser, MD  nitroGLYCERIN (NITROSTAT) 0.4 MG SL tablet Place 1 tablet (0.4 mg total) under the tongue every 5 (five) minutes as needed for chest pain. 11/05/20   Larey Dresser, MD  spironolactone (ALDACTONE) 25 MG tablet Take 1 tablet (25 mg total) by mouth daily. Patient not taking: Reported on 12/14/2021 12/02/21   Claretta Fraise, MD    Inpatient Medications: Scheduled Meds:  furosemide  80 mg Intravenous Once   sodium zirconium cyclosilicate  10 g Oral Daily  Continuous Infusions:  PRN Meds:   Allergies:    Allergies  Allergen  Reactions   Tape Rash    Use paper tape.    Trazodone And Nefazodone Palpitations    tachycardia   Mirtazapine     imbalance   Elemental Sulfur Rash   Sulfa Antibiotics Rash    Social History:   Social History   Socioeconomic History   Marital status: Widowed    Spouse name: Not on file   Number of children: 3   Years of education: Not on file   Highest education level: Not on file  Occupational History   Not on file  Tobacco Use   Smoking status: Some Days    Types: Pipe   Smokeless tobacco: Never  Vaping Use   Vaping Use: Never used  Substance and Sexual Activity   Alcohol use: Never   Drug use: Never   Sexual activity: Not Currently  Other Topics Concern   Not on file  Social History Narrative   Wife passed away in 2007-05-10.   2 daughters, 1 son. All live close by.    3 grandchildren.    Social Determinants of Health   Financial Resource Strain: Low Risk  (12/07/2021)   Overall Financial Resource Strain (CARDIA)    Difficulty of Paying Living Expenses: Not hard at all  Food Insecurity: No Food Insecurity (12/07/2021)   Hunger Vital Sign    Worried About Running Out of Food in the Last Year: Never true    Ran Out of Food in the Last Year: Never true  Transportation Needs: No Transportation Needs (12/07/2021)   PRAPARE - Hydrologist (Medical): No    Lack of Transportation (Non-Medical): No  Physical Activity: Insufficiently Active (04/15/2021)   Exercise Vital Sign    Days of Exercise per Week: 7 days    Minutes of Exercise per Session: 20 min  Stress: No Stress Concern Present (04/15/2021)   [place]    Feeling of Stress : Not at all  Social Connections: Moderately Integrated (04/15/2021)   Social Connection and Isolation Panel [NHANES]    Frequency of Communication with Friends and Family: More than three times a week    Frequency of Social Gatherings with Friends  and Family: More than three times a week    Attends Religious Services: 1 to 4 times per year    Active Member of [employer] or Organizations: No    Attends Archivist Meetings: 1 to 4 times per year    Marital Status: Widowed  Intimate Partner Violence: Not At Risk (12/07/2021)   Humiliation, Afraid, Rape, and Kick questionnaire    Fear of Current or Ex-Partner: No    Emotionally Abused: No    Physically Abused: No    Sexually Abused: No    Family History:    Family History  Problem Relation Age of Onset   Clotting disorder Mother    Hypertension Sister    Diabetes Sister      ROS:  Please see the history of present illness.  ROS None All other ROS reviewed and negative.     Physical Exam/Data:   Vitals:   12/14/21 1030 12/14/21 1100 12/14/21 1200 12/14/21 1430  BP: (!) 128/53 (!) 123/59 118/62 119/62  Pulse: 64 66 67 (!) 57  Resp: (!) 28 (!) 22 (!) 24 20  Temp:   98.5 F (36.9 C) 98.5 F (36.9 C)  TempSrc:   Oral Oral  SpO2: 93% 96% 96% 96%   No intake or output data in the 24 hours ending 12/14/21 1456 There were no vitals filed for this visit. There is no height or weight on file to calculate BMI.  General:  Well nourished, well developed, in no acute distress HEENT: normal Lymph: no adenopathy Neck:  JVD + Endocrine:  No thryomegaly Vascular: No carotid bruits; FA pulses 2+ bilaterally without bruits  Cardiac:  normal S1, S2; RRR; no murmur  Lungs: Bilateral fine rales + Abd: Abdominal distention present Ext: 2-3+ pitting edema in LE Musculoskeletal:  No deformities, BUE and BLE strength normal and equal Skin: warm and dry  Neuro:  CNs 2-12 intact, no focal abnormalities noted Psych:  Normal affect   EKG:  The EKG was personally reviewed and demonstrates:  Sinus rhythm Telemetry:  Telemetry was personally reviewed and demonstrates:  NSR, sinus bradycardia and PVCs  Laboratory Data:  Chemistry Recent Labs  Lab 12/09/21 1016 12/14/21 1101  NA  137 134*  K 4.7 5.9*  CL 102 103  CO2 26 23  GLUCOSE 157* 321*  BUN 65* 73*  CREATININE 3.32* 3.77*  CALCIUM 7.9* 7.9*  GFRNONAA 19* 16*  ANIONGAP 9 8    Recent Labs  Lab 12/14/21 1101  PROT 5.4*  ALBUMIN 3.1*  AST 16  ALT 15  ALKPHOS 74  BILITOT 1.5*   Hematology Recent Labs  Lab 12/14/21 1101  WBC 4.3  RBC 2.68*  HGB 7.4*  HCT 25.3*  MCV 94.4  MCH 27.6  MCHC 29.2*  RDW 18.4*  PLT 72*   Cardiac EnzymesNo results for input(s): "TROPONINI" in the last 168 hours. No results for input(s): "TROPIPOC" in the last 168 hours.  BNP Recent Labs  Lab 12/09/21 1016 12/14/21 1101  BNP >4,500.0* >4,500.0*    DDimer No results for input(s): "DDIMER" in the last 168 hours.  Cardiology/Studies:  Echo in 11/2021 LVEF 25-30% Grade III diastolic dysfunction  LHC in 2022 1. Normal (low) filling pressures.  2. Preserved cardiac output.  3. Severe 3 vessel disease.  The RCA is occluded with collaterals to the PDA.  The proximal-mid LAD has a long segment of up to 99% stenosis.  The ostial LCx has 80% stenosis.  There is significant disease in the OMs and diagonals.   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  #Acute systolic and diastolic heart failure exacerbation Plan -Start IV Lasix 80 mg 1 dose now followed by Lasix drip 10 mg/h. Obtain CMP Q12h. Keep K>4 and <5, Mg>2 and <3. -Hold metoprolol succinate 100 mg once daily and continue amiodarone 100 mg once daily -Hold ACEi (did not tolerate Entresto in the past)/MRA/SGLT-2 inhibitors -Daily weights, ins and outs, 1.2 L fluid restriction -Patient refused ICD and hence not a candidate for  LifeVest  #Paroxysmal Afib, currently in NSR Plan -Hold metoprolol succinate 100 mg and continue amiodarone 200 mg once daily -Continue Eliquis 5 mg twice daily.  Obtain CBC in the a.m. and if platelets are less than 50k, hold Eliquis.  #Multivessel CAD (refused CABG and ICD) Plan -Keep hemoglobin  more than 8.  Patient will benefit from blood transfusion in the a.m. -Patient not on aspirin due to concurrent Eliquis use. Continue high intensity statin, atorvastatin 40 mg nightly and Zetia 10 mg once daily.  I have spent a total of 45 minutes with patient reviewing chart , telemetry, EKGs, labs and examining patient as well as establishing an assessment and plan that was discussed with the patient.  > 50% of time was spent in direct patient care.       For questions or updates, please contact [place] Please consult [email or web address] for contact info under Cardiology/STEMI.   Signed, Vangie Bicker, MD 12/14/2021 2:56 PM

## 2021-12-15 DIAGNOSIS — I509 Heart failure, unspecified: Secondary | ICD-10-CM | POA: Diagnosis not present

## 2021-12-15 DIAGNOSIS — E785 Hyperlipidemia, unspecified: Secondary | ICD-10-CM

## 2021-12-15 LAB — COMPREHENSIVE METABOLIC PANEL
ALT: 15 U/L (ref 0–44)
AST: 14 U/L — ABNORMAL LOW (ref 15–41)
Albumin: 2.9 g/dL — ABNORMAL LOW (ref 3.5–5.0)
Alkaline Phosphatase: 67 U/L (ref 38–126)
Anion gap: 8 (ref 5–15)
BUN: 72 mg/dL — ABNORMAL HIGH (ref 8–23)
CO2: 23 mmol/L (ref 22–32)
Calcium: 7.7 mg/dL — ABNORMAL LOW (ref 8.9–10.3)
Chloride: 103 mmol/L (ref 98–111)
Creatinine, Ser: 3.74 mg/dL — ABNORMAL HIGH (ref 0.61–1.24)
GFR, Estimated: 16 mL/min — ABNORMAL LOW (ref >60)
Glucose, Bld: 148 mg/dL — ABNORMAL HIGH (ref 70–99)
Potassium: 4.9 mmol/L (ref 3.5–5.1)
Sodium: 134 mmol/L — ABNORMAL LOW (ref 135–145)
Total Bilirubin: 0.9 mg/dL (ref 0.3–1.2)
Total Protein: 5.1 g/dL — ABNORMAL LOW (ref 6.5–8.1)

## 2021-12-15 LAB — CBC
HCT: 26.6 % — ABNORMAL LOW (ref 39.0–52.0)
Hemoglobin: 7.7 g/dL — ABNORMAL LOW (ref 13.0–17.0)
MCH: 27.1 pg (ref 26.0–34.0)
MCHC: 28.9 g/dL — ABNORMAL LOW (ref 30.0–36.0)
MCV: 93.7 fL (ref 80.0–100.0)
Platelets: 73 10*3/uL — ABNORMAL LOW (ref 150–400)
RBC: 2.84 MIL/uL — ABNORMAL LOW (ref 4.22–5.81)
RDW: 18.6 % — ABNORMAL HIGH (ref 11.5–15.5)
WBC: 4.1 10*3/uL (ref 4.0–10.5)
nRBC: 0 % (ref 0.0–0.2)

## 2021-12-15 LAB — GLUCOSE, CAPILLARY
Glucose-Capillary: 213 mg/dL — ABNORMAL HIGH (ref 70–99)
Glucose-Capillary: 274 mg/dL — ABNORMAL HIGH (ref 70–99)
Glucose-Capillary: 274 mg/dL — ABNORMAL HIGH (ref 70–99)

## 2021-12-15 LAB — MAGNESIUM: Magnesium: 2.9 mg/dL — ABNORMAL HIGH (ref 1.7–2.4)

## 2021-12-15 LAB — PREPARE RBC (CROSSMATCH)

## 2021-12-15 MED ORDER — FUROSEMIDE 10 MG/ML IJ SOLN
40.0000 mg | Freq: Once | INTRAMUSCULAR | Status: AC
  Administered 2021-12-15: 40 mg via INTRAVENOUS
  Filled 2021-12-15: qty 4

## 2021-12-15 MED ORDER — SODIUM CHLORIDE 0.9% IV SOLUTION: Freq: Once | INTRAVENOUS | Status: DC

## 2021-12-15 MED ORDER — FUROSEMIDE 10 MG/ML IJ SOLN
120.0000 mg | Freq: Three times a day (TID) | INTRAVENOUS | Status: DC
  Administered 2021-12-15 – 2021-12-16 (×5): 120 mg via INTRAVENOUS
  Filled 2021-12-15 (×10): qty 12

## 2021-12-15 MED ORDER — FUROSEMIDE 10 MG/ML IJ SOLN
10.0000 mg/h | INTRAVENOUS | Status: DC
  Administered 2021-12-15: 10 mg/h via INTRAVENOUS
  Filled 2021-12-15 (×2): qty 20

## 2021-12-15 NOTE — TOC Initial Note (Signed)
Transition of Care [facility]) - Initial/Assessment Note    Patient Details  Name: Daniel Myers MRN: [id number] Date of Birth: 05/17/1945  [facility] CM/SW Contact:    Iona Beard, [place] Phone Number: 12/15/2021, 3:46 PM  Clinical Narrative:                 Pt is high risk for readmission. CSW spoke with pt in room to complete assessment. Pt states that he lives alone but his daughters and granddaughter check in and assist him as needed. Family is able to provide transportation when needed. Pt is active with [facility] PT/RN services through [facility]. Pt will need new HH orders prior to D/C. Pt states that he has cane, walker, wheelchair, and BSC to use when needed. TOC to follow.   Expected Discharge Plan: [place] Barriers to Discharge: Continued Medical Work up   Patient Goals and CMS Choice Patient states their goals for this hospitalization and ongoing recovery are:: return home with [facility] CMS Medicare.gov Compare Post Acute Care list provided to:: Patient Choice offered to / list presented to : Patient  Expected Discharge Plan and Services Expected Discharge Plan: [place] In-house Referral: Clinical Social Work Discharge Planning Services: CM Consult Post Acute Care Choice: [place] arrangements for the past 2 months: Single Family Home                                      Prior Living Arrangements/Services Living arrangements for the past 2 months: Single Family Home Lives with:: Self Patient language and need for interpreter reviewed:: Yes Do you feel safe going back to the place where you live?: Yes      Need for Family Participation in Patient Care: Yes (Comment) Care giver support system in place?: Yes (comment) Current home services: DME Criminal Activity/Legal Involvement Pertinent to Current Situation/Hospitalization: No - Comment as needed  Activities of Daily Living      Permission  Sought/Granted                  Emotional Assessment Appearance:: Appears stated age Attitude/Demeanor/Rapport: Engaged Affect (typically observed): Accepting Orientation: : Oriented to Self, Oriented to Place, Oriented to  Time, Oriented to Situation Alcohol / Substance Use: Not Applicable Psych Involvement: No (comment)  Admission diagnosis:  Weakness [R53.1] Patient Active Problem List   Diagnosis Date Noted   Acute on chronic congestive heart failure ([place]) 12/15/2021   Weakness 12/14/2021   CAD (coronary artery disease)/Prior MI 11/17/2021   Obesity (BMI 30-39.9) 11/17/2021   NSTEMI (non-ST elevated myocardial infarction) ([place]) 11/16/2021   Hyperkalemia 11/16/2021   Goals of care, counseling/discussion 11/16/2021   DNR (do not resuscitate) 11/16/2021   Cerebrovascular accident (CVA) ([place]) 12/19/2020   Persistent atrial fibrillation ([place]) 03/11/2020   Acute on chronic combined systolic and diastolic CHF (congestive heart failure) ([place]) 02/13/2020   Thrombocytopenia ([place]) 02/13/2020   CKD (chronic kidney disease) stage 4, GFR 15-29 ml/min ([place]) 02/12/2020   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   Proteinuria 01/22/2020   Essential hypertension 11/22/2018   Insulin dependent type 2 diabetes mellitus ([place]) 11/22/2018   Renal transplant recipient 11/22/2018   Mixed hyperlipidemia 11/22/2018   Vision decreased 11/22/2018   Hyponatremia 11/22/2018   Seasonal allergic rhinitis due to pollen 11/22/2018   Diabetic nephropathy associated with type 2 diabetes mellitus ([place]) 05/04/2016   Uncontrolled  type 2 diabetes mellitus with hyperglycemia ([place]) 10/29/2015   Immunosuppressive management encounter following kidney transplant 10/07/2015   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   History of hemodialysis 03/30/2012   Long-term use of immunosuppressant medication 03/24/2011   PCP:  Claretta Fraise, MD Pharmacy:   [facility], [address] [place] [address] Phone: [phone] Fax: [phone]     Social Determinants of Health (SDOH) Interventions    Readmission Risk Interventions    12/15/2021    3:45 PM 11/17/2021    3:51 PM  Readmission Risk Prevention Plan  Transportation Screening Complete Complete  HRI or Home Care Consult Complete Complete  Social Work Consult for [place] Planning/Counseling Complete Complete  Palliative Care Screening Not Applicable Not Applicable  Medication Review Press photographer) Complete Complete

## 2021-12-15 NOTE — Consult Note (Signed)
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  In the ED work-up identified with worsening anemia with hemoglobin of 7.4 where at previous admission it was greater than 8, portable chest x-ray with no overt edema but mildly displaced left rib fractures noted.  Creatinine worsened to 3.77 with potassium of 5.9 for which she received Lokelma.  He was seen by cardiology and given bolus Lasix followed by Lasix drip at 10 mg an hour.    This morning he feels little bit improved.  Urine output of at least 0.8 L.  Creatinine stable at 3.7, K improved to 4.9. Renal transplant ultrasound done with patent renal vasculature and no evidence of hydronephrosis.  He is opted against aggressive cardiac procedures such as ICD and CABG.  We discussed the potential role of dialysis in his future and he is very hesitant to think he would ever choose it.  Creatinine, Ser (mg/dL)  Date Value  12/15/2021 3.74 (H)  12/14/2021 3.77 (H)  12/09/2021 3.32 (H)  12/02/2021 2.90 (H)  11/20/2021 2.51 (H)  11/19/2021 2.18 (H)  11/18/2021 2.18 (H)  11/17/2021 2.06 (H)  11/17/2021 1.94 (H)  11/16/2021 2.00 (H)  ] I/Os: I/O last 3 completed shifts: In: 120 [P.O.:120] Out: 800 [Urine:800]   ROS NSAIDS: No exposure IV Contrast no exposure TMP/SMX no use Hypotension not present Balance of 12 systems is negative w/ exceptions as  above  PMH  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   nodular on left jawline - excision   Basal cell carcinoma 07/01/2014   nodular on left hawling - CX3+5FU+excision   Basal cell carcinoma 10/10/2014   nodular on right forearm, middle - tx p bx   Basal cell carcinoma 02/11/2015   left neck - CX3 + excision   Basal cell carcinoma 06/14/2016   left jawline - CX3+5FU   Basal cell carcinoma 02/22/2017   superficial and nodular on left neck - excision   Basal cell carcinoma 04/11/2018   superficial and nodular on right inferior forearm - CX3+Cautery+5FU   Chronic kidney disease    History of renal transplant    Hyperlipidemia    Hypertension    NSTEMI (non-ST elevated myocardial infarction) ([place] II) 02/13/2020   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   Left [place] (well diff)   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   Right Breast (in situ)   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   Left Forearm - anterior (mod diff)   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   Neck - anterior (mod diff)   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   in situ on left arm - CX3+5FU   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   hypertrophic on left ear - CX3+5FU   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   in situ on right temple - CX3+5FU   Squamous cell carcinoma of  skin 11/08/2011   right upper forearm - tx p bx   Squamous cell carcinoma of skin 11/08/2011   left upper forearm - tx p bx   Squamous cell carcinoma of skin 11/08/2011   left hand - tx p bx   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/27/2013   in situ on left lower back - CX3+5FU   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/27/2013   in situ on left forehead - CX3+5FU   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/27/2013   in situ on right temple - watch per ST   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/27/2013   in situ on right forearm - CX3+5FU   Squamous cell carcinoma  of skin 07/01/2014   well differentiated on right sideburn - CX3+5FU   Squamous cell carcinoma of skin 07/01/2014   in situ on left shoulder - CX3+5FU   Squamous cell carcinoma of skin 07/01/2014   in situ on right forearm, proximal - CX3+5FU+Cautery   Squamous cell carcinoma of skin 07/01/2014   in situ on right forearm, distal - CX3+5FU   Squamous cell carcinoma of skin 09/30/2015   in situ on posterior left ear - CX3+5FU   Squamous cell carcinoma of skin 02/22/2017   in situ on right upper arm - CX3+5FU   Squamous cell carcinoma of skin 02/22/2017   in situ on left upper arm - CX3+5FU   Squamous cell carcinoma of skin 04/11/2018   in situ on right temple - CX3+5FU   Squamous cell carcinoma of skin 04/11/2018   in situ on lateral right arm - MOHs   Squamous cell carcinoma of skin 04/11/2018   in situ on right upper arm - MOHs   Squamous cell carcinoma of skin 04/11/2018   in situ on left sideburn   Squamous cell carcinoma of skin 04/11/2018   in situ on right flank - tx p bx   Squamous cell carcinoma of skin 09/28/2018   in situ on right inner ear - tx p bx   Squamous cell carcinoma of skin 09/28/2018   in situ on left inner ear - tx p bx   Squamous cell carcinoma of skin 09/28/2018   in situ on right arm - tx p bx   Squamous cell carcinoma of skin 03/21/2019   in situ on right antihelix ([place]kov treated topically)   Squamous cell carcinoma of skin 03/21/2019   in situ on right neck - CX3+5FU   Squamous cell carcinoma of skin 03/21/2019   in situ on left outer eye, inf (MOHs done 05/23/2019)   Type 2 diabetes mellitus ([place])    Unspecified atrial fibrillation ([place]) 02/17/2020   PSH  Past Surgical History:  Procedure Laterality Date   CARDIOVERSION N/A 10/16/2020   Procedure: CARDIOVERSION;  Surgeon: Larey Dresser, MD;  Location: [facility] ENDOSCOPY;  Service: Cardiovascular;  Laterality: N/A;   KIDNEY TRANSPLANT Right    RIGHT/LEFT HEART CATH AND CORONARY ANGIOGRAPHY N/A  09/09/2020   Procedure: RIGHT/LEFT HEART CATH AND CORONARY ANGIOGRAPHY;  Surgeon: Larey Dresser, MD;  Location: [facility] LAB;  Service: Cardiovascular;  Laterality: N/A;   FH  Family History  Problem Relation Age of Onset   Clotting disorder Mother    Hypertension Sister    Diabetes Sister    SH  reports that he has been smoking pipe. He has never used smokeless tobacco. He reports that he does not drink alcohol and does not use drugs. Allergies  Allergies  Allergen Reactions  Tape Rash    Use paper tape.    Trazodone And Nefazodone Palpitations    tachycardia   Mirtazapine     imbalance   Elemental Sulfur Rash   Sulfa Antibiotics Rash   Home medications Prior to Admission medications   Medication Sig Start Date End Date Taking? Authorizing Provider  acetaminophen (TYLENOL) 500 MG tablet Take 500 mg by mouth every 8 (eight) hours as needed for moderate pain.   Yes [provider]  albuterol (VENTOLIN HFA) 108 (90 Base) MCG/ACT inhaler Inhale 2 puffs into the lungs every 6 (six) hours as needed for wheezing or shortness of breath. 11/13/21  Yes Stacks, [name], MD  ALPRAZolam Duanne Moron) 0.25 MG tablet Take 1 tablet (0.25 mg total) by mouth at bedtime as needed for anxiety. 08/05/21  Yes Stacks, [name], MD  amiodarone (PACERONE) 200 MG tablet Take 0.5 tablets (100 mg total) by mouth daily. 10/14/21  Yes Larey Dresser, MD  atorvastatin (LIPITOR) 40 MG tablet Take 1 tablet (40 mg total) by mouth daily. 11/10/21  Yes Claretta Fraise, MD  dapagliflozin propanediol (FARXIGA) 10 MG TABS tablet Take 1 tablet (10 mg total) by mouth daily before breakfast. 02/25/21  Yes Stacks, [name], MD  ELIQUIS 5 MG TABS tablet Take 1 tablet by mouth twice daily 11/09/21  Yes Larey Dresser, MD  ezetimibe (ZETIA) 10 MG tablet Take 1 tablet (10 mg total) by mouth daily. 08/17/21  Yes Satira Sark, MD  fluticasone (FLONASE) 50 MCG/ACT nasal spray USE ONE SPRAY(S) IN EACH NOSTRIL ONCE  DAILY Patient taking differently: Place 1 spray into both nostrils daily as needed for allergies. 05/07/20  Yes Claretta Fraise, MD  furosemide (LASIX) 20 MG tablet Take 40 mg by mouth 2 (two) times daily.   Yes [provider]  insulin NPH Human (NOVOLIN N) 100 UNIT/ML injection 10 units AC breakfast and 10 units AC supper 08/05/21  Yes Stacks, [name], MD  insulin regular (NOVOLIN R) 100 units/mL injection 16 units w bkfst and 13 units w dinner 04/30/21  Yes Stacks, [name], MD  loratadine (CLARITIN) 10 MG tablet Take 10 mg by mouth daily as needed for allergies. 10/24/06  Yes [provider]  metoprolol succinate (TOPROL-XL) 100 MG 24 hr tablet Take 1 tablet (100 mg total) by mouth daily. Take with or immediately following a meal. 08/05/21  Yes Stacks, [name], MD  mycophenolate (CELLCEPT) 250 MG capsule Take 500 mg by mouth 2 (two) times daily. 06/10/14  Yes [provider]  ondansetron (ZOFRAN) 4 MG tablet Take 1 tablet (4 mg total) by mouth every 8 (eight) hours as needed for nausea or vomiting. 04/17/20  Yes Stacks, [name], MD  predniSONE (DELTASONE) 5 MG tablet Take 5 mg by mouth daily with breakfast.   Yes [provider]  sodium bicarbonate 650 MG tablet Take 1,300 mg by mouth 2 (two) times daily. 09/26/18  Yes [provider]  sodium zirconium cyclosilicate (LOKELMA) 10 g PACK packet Take 10 g by mouth as directed. By the HF clinic Patient taking differently: Take 10 g by mouth as directed. By the HF clinic just as needed 10/27/21  Yes Larey Dresser, MD  sulfamethoxazole-trimethoprim (BACTRIM) 400-80 MG tablet Take 1 tablet by mouth every Monday, Wednesday, and Friday.   Yes [provider]  tacrolimus (PROGRAF) 0.5 MG capsule Take 1 mg by mouth in the morning and at bedtime. 07/08/15  Yes [provider]  Vericiguat (VERQUVO) 2.5 MG TABS Take 2.5 mg by mouth  daily. 12/01/21  Yes Larey Dresser, MD  nitroGLYCERIN (NITROSTAT) 0.4 MG SL  tablet Place 1 tablet (0.4 mg total) under the tongue every 5 (five) minutes as needed for chest pain. 11/05/20   Larey Dresser, MD  spironolactone (ALDACTONE) 25 MG tablet Take 1 tablet (25 mg total) by mouth daily. Patient not taking: Reported on 12/14/2021 12/02/21   Claretta Fraise, MD    Current Medications Scheduled Meds:  amiodarone  100 mg Oral Daily   apixaban  5 mg Oral BID   atorvastatin  40 mg Oral Daily   Chlorhexidine Gluconate Cloth  6 each Topical Q0600   ezetimibe  10 mg Oral Daily   insulin aspart  0-5 Units Subcutaneous QHS   insulin aspart  0-9 Units Subcutaneous TID WC   insulin NPH Human  10 Units Subcutaneous BID AC   mupirocin ointment  1 Application Nasal BID   mycophenolate  500 mg Oral BID   predniSONE  5 mg Oral Q breakfast   sodium chloride flush  3 mL Intravenous Q12H   sodium zirconium cyclosilicate  10 g Oral Daily   tacrolimus  1 mg Oral BID   Vericiguat  2.5 mg Oral Daily   Continuous Infusions:  sodium chloride     furosemide     PRN Meds:.sodium chloride, acetaminophen **OR** acetaminophen, albuterol, ALPRAZolam, fluticasone, loratadine, ondansetron **OR** ondansetron (ZOFRAN) IV, sodium chloride flush  CBC Recent Labs  Lab 12/14/21 1101 12/15/21 0342  WBC 4.3 4.1  HGB 7.4* 7.7*  HCT 25.3* 26.6*  MCV 94.4 93.7  PLT 72* 73*   Basic Metabolic Panel Recent Labs  Lab 12/09/21 1016 12/14/21 1101 12/15/21 0342  NA 137 134* 134*  K 4.7 5.9* 4.9  CL 102 103 103  CO2 '26 23 23  '$ GLUCOSE 157* 321* 148*  BUN 65* 73* 72*  CREATININE 3.32* 3.77* 3.74*  CALCIUM 7.9* 7.9* 7.7*    Physical Exam  Blood pressure (!) 120/41, pulse 61, temperature 98.2 F (36.8 C), temperature source Oral, resp. rate (!) 23, weight 98 kg, SpO2 90 %. GEN: Chronically ill-appearing, lying in bed at 30 degrees, normal work of breathing ENT: NCAT EYES: EOMI CV: Regular, normal S1 and S2 PULM: Diminished at the bases, scattered end expiratory wheezing  throughout ABD: Soft, nontender, no bruits SKIN: Scattered ecchymoses throughout, nonpalpable. EXT: Trace to 1+ lower extremity edema extinguishing admissions  Assessment 31M extensive ASCVD and HFrEF from ICM admitted with weakness, falls, likely acute on chronic HFrEF exacerbation with acute on chronic CKD in the setting of a kidney transplant.  AoC CKD3T AoC HFrEF, cardiology following.  Received held, beta-blocker held. Progressive debility/weakness/falls Anemia, hemoglobin 7s Chronic immunosuppression on tacrolimus, mycophenolate, prednisone Permanent A-fib on apixaban, amiodarone, beta-blocker which is held Mild hyperkalemia at admission, improved Chronic metabolic acidosis on sodium bicarbonate, normal serum bicarbonate consistently DM2 on insulin, Farxiga Mild hyponatremia related to #2   Plan We will transition to bolus Lasix, Lasix 120 mg IV 3 times daily Continue immunosuppression at outpatient dosing Lengthy conversation with patient and daughter about goals of care as it relates to progression to ESRD and nothing firm decided but I think they would choose against dialysis Stop [facility], can use as needed Stop sodium bicarbonate, little clinical benefit here Daily weights, Daily Renal Panel, Strict I/Os, Avoid nephrotoxins (NSAIDs, judicious IV Contrast)   Rexene Agent  12/15/2021, 9:39 AM

## 2021-12-15 NOTE — Evaluation (Signed)
Physical Therapy Evaluation Patient Details Name: Daniel Myers MRN: [id number] DOB: [birth year] Today's Date: 12/15/2021  History of Present Illness  Daniel Myers is a 76 y.o. 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  He endorses lower extremity weakness and denies any loss of consciousness or head injury.  He is noted to have some bruising and lacerations on his extremities and notes increasing tiredness.  Much of this appears to have started since his NSTEMI approximately 1 month ago.  He has been taking Lasix as well as potassium since then and has had some intermittent gastrointestinal symptoms of epigastric abdominal pain as well as some mild diarrhea.  He does to have some pitting edema to bilateral knees.   Clinical Impression  Patient demonstrates slightly labored movement for sitting up at bedside with difficulty moving covers, has to lean on armrest of chair for support completing transfers without AD, safer using RW demonstrating fair/good return for ambulating in room/hallway, but tendency to lift walker off floor when making turns requiring verbal/tactile cueing for safety.  Patient tolerated sitting up in chair after therapy - nursing staff aware.  Patient will benefit from continued skilled physical therapy in hospital and recommended venue below to increase strength, balance, endurance for safe ADLs and gait.         Recommendations for follow up therapy are one component of a multi-disciplinary discharge planning process, led by the attending physician.  Recommendations may be updated based on patient status, additional functional criteria and insurance authorization.  Follow  Up Recommendations Home health PT      Assistance Recommended at Discharge Set up Supervision/Assistance  Patient can return home with the following  A little help with walking and/or transfers;A little help with bathing/dressing/bathroom;Assistance with cooking/housework;Help with stairs or ramp for entrance    Equipment Recommendations None recommended by PT  Recommendations for Other Services       Functional Status Assessment Patient has had a recent decline in their functional status and demonstrates the ability to make significant improvements in function in a reasonable and predictable amount of time.     Precautions / Restrictions Precautions Precautions: Fall Restrictions Weight Bearing Restrictions: No      Mobility  Bed Mobility Overal bed mobility: Needs Assistance Bed Mobility: Supine to Sit     Supine to sit: Supervision     General bed mobility comments: slightly labored movemen with increased time    Transfers Overall transfer level: Needs assistance Equipment used: Rolling walker (2 wheels) Transfers: Sit to/from Stand, Bed to chair/wheelchair/BSC Sit to Stand: Supervision, Min guard   Step pivot transfers: Min guard, Supervision       General transfer comment: has to lean on armrest of chair when not using an AD, safer using RW    Ambulation/Gait Ambulation/Gait assistance: Min guard, Min assist Gait Distance (Feet): 65 Feet Assistive device: Rolling walker (2 wheels) Gait Pattern/deviations: Decreased step length - left, Decreased stance time - right, Decreased stride length, Trunk flexed Gait velocity: decreased     General Gait Details: labored movement having to lean on RW for support with flexed trunk, increased time for making turns with tendency to lift walker off floor and limited mostly due to  fatigue  Stairs            Wheelchair Mobility    Modified Rankin (Stroke Patients Only)       Balance Overall balance  assessment: Needs assistance Sitting-balance support: Feet supported, No upper extremity supported Sitting balance-Leahy Scale: Fair Sitting balance - Comments: fair/good seated at EOB   Standing balance support: During functional activity, No upper extremity supported Standing balance-Leahy Scale: Poor Standing balance comment: fair/poor without AD, fair using RW                             Pertinent Vitals/Pain Pain Assessment Pain Assessment: No/denies pain    Home Living Family/patient expects to be discharged to:: Private residence Living Arrangements: Alone Available Help at Discharge: Family;Available 24 hours/day Type of Home: House Home Access: Stairs to enter;Ramped entrance   Entrance Stairs-Number of Steps: 2   Home Layout: One level Home Equipment: [place] (2 wheels);Toilet riser;Shower seat;BSC/3in1;Grab bars - tub/shower      Prior Function Prior Level of Function : Independent/Modified Independent             Mobility Comments: household and short distanced community ambulator using quad-cane ADLs Comments: Independent with household ADLs, assisted by family for community ADLs     Hand Dominance        Extremity/Trunk Assessment   Upper Extremity Assessment Upper Extremity Assessment: Overall WFL for tasks assessed    Lower Extremity Assessment Lower Extremity Assessment: Generalized weakness    Cervical / Trunk Assessment Cervical / Trunk Assessment: Kyphotic  Communication   Communication: No difficulties  Cognition Arousal/Alertness: Awake/alert Behavior During Therapy: WFL for tasks assessed/performed Overall Cognitive Status: Within Functional Limits for tasks assessed                                          General Comments      Exercises     Assessment/Plan    PT Assessment Patient needs continued PT services  PT Problem List Decreased strength;Decreased activity  tolerance;Decreased balance;Decreased mobility       PT Treatment Interventions DME instruction;Gait training;Stair training;Functional mobility training;Therapeutic activities;Therapeutic exercise;Patient/family education;Balance training    PT Goals (Current goals can be found in the Care Plan section)  Acute Rehab PT Goals Patient Stated Goal: return home with family to assist PT Goal Formulation: With patient Time For Goal Achievement: 12/22/21 Potential to Achieve Goals: Good    Frequency Min 3X/week     Co-evaluation               AM-PAC PT "6 Clicks" Mobility  Outcome Measure Help needed turning from your back to your side while in a flat bed without using bedrails?: None Help needed moving from lying on your back to sitting on the side of a flat bed without using bedrails?: None Help needed moving to and from a bed to a chair (including a wheelchair)?: A Little Help needed standing up from a chair using your arms (e.g., wheelchair or bedside chair)?: A Little Help needed to walk in hospital room?: A Little Help needed climbing 3-5 steps with a railing? : A Lot 6 Click Score: 19    End of Session   Activity Tolerance: Patient tolerated treatment well;Patient limited by fatigue Patient left: in chair;with call bell/phone within reach Nurse Communication: Mobility status PT  Visit Diagnosis: Unsteadiness on feet (R26.81);Other abnormalities of gait and mobility (R26.89);Muscle weakness (generalized) (M62.81)    Time: 1510-1530 PT Time Calculation (min) (ACUTE ONLY): 20 min   Charges:   PT Evaluation $PT Eval Moderate Complexity: 1 Mod PT Treatments $Therapeutic Activity: 8-22 mins        3:44 PM, 12/15/21 Lonell Grandchild, MPT Physical Therapist with [facility] [phone] office [phone] mobile phone

## 2021-12-15 NOTE — Progress Notes (Addendum)
Subjective:  Patient feels well overall with improvement in his dyspnea. Per nursing, patient was thought to have a few apneic episodes last night in which patient desaturated to low 90's but saturation improved upon awakening.  Objective:  Vital signs in last 24 hours: Vitals:   12/15/21 0439 12/15/21 0500 12/15/21 0600 12/15/21 0700  BP:  (!) 108/37 (!) 120/41   Pulse:  60 61   Resp:  (!) 24 (!) 23   Temp: 98.6 F (37 C)   98.2 F (36.8 C)  TempSrc: Oral   Oral  SpO2:  95% 90%   Weight: 98 kg      Weight change:   Intake/Output Summary (Last 24 hours) at 12/15/2021 4128 Last data filed at 12/14/2021 1800 Gross per 24 hour  Intake 120 ml  Output 800 ml  Net -680 ml   Physical Examination  General: lying in bed, in no acute distress Head: normocephalic, atraumatic Cardiovascular: regular rate and rhythm without murmurs, rubs, or gallops, 2+ LEE bilaterally up to knee Respiratory: normal respiratory effort, lungs CTAB Abdominal: normoactive bowel sounds, no tenderness to palpation Skin: warm, dry Central Nervous System: alert and oriented x 3 Psychiatric: Normal mood and affect   Assessment/Plan:  Principal Problem:   Weakness Active Problems:   Essential hypertension   Insulin dependent type 2 diabetes mellitus (HCC)   Mixed hyperlipidemia   CKD (chronic kidney disease) stage 4, GFR 15-29 ml/min (HCC)   Thrombocytopenia (HCC)   Cerebrovascular accident (CVA) ([place])   Diabetic nephropathy associated with type 2 diabetes mellitus ([place])   Immunosuppression ([place])   Long-term use of immunosuppressant medication   NSTEMI (non-ST elevated myocardial infarction) ([place])   DNR (do not resuscitate)   CAD (coronary artery disease)/Prior MI   Obesity (BMI 30-39.9)   Generalized weakness with multiple falls and noted old left-sided rib fractures -Noted overall to have physical deconditioning and appreciate PT evaluation with fall precautions -Multifactorial in the  setting of CHF exacerbation as well as AKI   AKI on CKD stage IV with prior history of transplant -Baseline creatinine of approximately 2 and is currently 3.74 (3.77 yesterday) -Appreciate nephrology consultation while inpatient -Usually follows with nephrologist Dr. Joseph Berkshire -Spironolactone and ACE inhibitors discontinued recently   Acute on chronic systolic/diastolic CHF -Continue home beta-blockers and Farxiga along with vericiguat -Patient not interested in ICD per prior note -Start IV Lasix per cardiology (patient has received two 40 mg IV pushes with plan to start drip). -Monitor strict I's and O's and daily weights   Hyperkalemia -Likely related to AKI above -Appreciate nephrology recommendations for [facility] and calcium gluconate for stabilization -Potassium is currently 4.9 (5.9) yesterday.   CAD with prior NSTEMI -Appreciate cardiology evaluation -Patient does not want any further invasive interventions from prior note from heart failure team 11/1 -Continue atorvastatin and Eliquis   Insulin-dependent diabetes with hyperglycemia -CBG is 265 -Carb modified diet and SSI (will increase from sensitive to moderate today) while inpatient as well as continuation of home insulin -Currently on Farxiga as noted above -Recent hemoglobin A1c 10/23 6.3%   Atrial fibrillation with recent DCCV to NSR 9/22 -Currently on amiodarone and Eliquis -Monitor on telemetry   Hypertension -Currently controlled, continue medications as noted above   History of prior CVA -Likely in the context of atrial fibrillation -Continue anticoagulation with Eliquis   Chronic anemia -Hgb is 7.7 -Likely related to CKD and patient is currently on Eliquis. Patient without overt signs of bleeding. -Transfuse for hemoglobin less than  8, with anticipated transfusion today. once further diuresed -Continue oral iron supplementation -May require Procrit/ESA   Thrombocytopenia 73 today. -Plan to hold Eliquis  for platelet count less than 50,000  Hypermagnesemia Magnesium is 2.9. Patient asymptomatic. Anticipate levels to decrease with Lasix. -Continue to monitor   Obesity -BMI 32.43 -Lifestyle changes outpatient   LOS: 1 day   Stanford Breed, Medical Student 12/15/2021, 9:24 AM

## 2021-12-15 NOTE — Progress Notes (Signed)
Progress Note  Patient Name: Daniel Myers Date of Encounter: 12/15/2021  Primary Cardiologist: Rozann Lesches, MD  Subjective   Patient stated he still the same compared to yesterday. Upon further inquiry, wife stated that he has been having DOE and leg swelling that started to develop after couple of days of hospital discharge in October 2023.  His dry weight is to 210 pounds.  Inpatient Medications    Scheduled Meds:  amiodarone  100 mg Oral Daily   apixaban  5 mg Oral BID   atorvastatin  40 mg Oral Daily   Chlorhexidine Gluconate Cloth  6 each Topical Q0600   ezetimibe  10 mg Oral Daily   insulin aspart  0-5 Units Subcutaneous QHS   insulin aspart  0-9 Units Subcutaneous TID WC   insulin NPH Human  10 Units Subcutaneous BID AC   mupirocin ointment  1 Application Nasal BID   mycophenolate  500 mg Oral BID   predniSONE  5 mg Oral Q breakfast   sodium chloride flush  3 mL Intravenous Q12H   tacrolimus  1 mg Oral BID   Vericiguat  2.5 mg Oral Daily   Continuous Infusions:  sodium chloride     furosemide     PRN Meds: sodium chloride, acetaminophen **OR** acetaminophen, albuterol, ALPRAZolam, fluticasone, loratadine, ondansetron **OR** ondansetron (ZOFRAN) IV, sodium chloride flush   Vital Signs    Vitals:   12/15/21 0439 12/15/21 0500 12/15/21 0600 12/15/21 0700  BP:  (!) 108/37 (!) 120/41   Pulse:  60 61   Resp:  (!) 24 (!) 23   Temp: 98.6 F (37 C)   98.2 F (36.8 C)  TempSrc: Oral   Oral  SpO2:  95% 90%   Weight: 98 kg       Intake/Output Summary (Last 24 hours) at 12/15/2021 1115 Last data filed at 12/14/2021 1800 Gross per 24 hour  Intake 120 ml  Output 800 ml  Net -680 ml   Filed Weights   12/15/21 0439  Weight: 98 kg    Telemetry     Personally reviewed, HR controlled at 50-60s.  ECG    An ECG dated 12/14/2021 was personally reviewed today and demonstrated:  Sinus bradycardia  Physical Exam   GEN: No acute distress.   Neck:  JVD+ Cardiac: Bibasilar rales +, S1 S2 heard Respiratory: Nonlabored..Bibasilar rales + GI: Abdominal distention present MS: 1-2+ pitting edema in LE; No deformity. Neuro:  Nonfocal. Psych: Alert and oriented x 3. Normal affect.  Labs    Chemistry Recent Labs  Lab 12/09/21 1016 12/14/21 1101 12/15/21 0342  NA 137 134* 134*  K 4.7 5.9* 4.9  CL 102 103 103  CO2 '26 23 23  '$ GLUCOSE 157* 321* 148*  BUN 65* 73* 72*  CREATININE 3.32* 3.77* 3.74*  CALCIUM 7.9* 7.9* 7.7*  PROT  --  5.4* 5.1*  ALBUMIN  --  3.1* 2.9*  AST  --  16 14*  ALT  --  15 15  ALKPHOS  --  74 67  BILITOT  --  1.5* 0.9  GFRNONAA 19* 16* 16*  ANIONGAP '9 8 8     '$ Hematology Recent Labs  Lab 12/14/21 1101 12/15/21 0342  WBC 4.3 4.1  RBC 2.68* 2.84*  HGB 7.4* 7.7*  HCT 25.3* 26.6*  MCV 94.4 93.7  MCH 27.6 27.1  MCHC 29.2* 28.9*  RDW 18.4* 18.6*  PLT 72* 73*    Cardiac Enzymes Recent Labs  Lab 11/16/21 1910 11/16/21 2113  11/17/21 0824 12/14/21 1101 12/14/21 1357  TROPONINIHS 672* 1,331* 5,874* 127* 130*    BNP Recent Labs  Lab 12/09/21 1016 12/14/21 1101  BNP >4,500.0* >4,500.0*     DDimerNo results for input(s): "DDIMER" in the last 168 hours.   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  #Acute systolic and diastolic heart failure exacerbation Plan -Start IV Lasix 40 mg 1 dose now followed by Lasix drip 10 mg/h. Nephrology recommended 120 mg IV  Lasix bolus doses every 8 hours. Either of the Lasix regimens is okay to use but patient will be receiving a higher dose of IV Lasix with bolus doses compared with the drip.Obtain CMP every 12 hours. Keep K >4 and <5, Mg>2 and <3. -Hold metoprolol succinate 100 mg once daily and continue amiodarone 100 mg once daily -Patient did not tolerate ACE inhibitor due to hyperkalemia, did not tolerate Entresto due to ?hypotension, did not tolerate MRI either.  Hold SGLT2 inhibitors. -Daily weights, ins and outs, 1.2 L fluid restriction -Patient refused ICD and hence not a candidate for LifeVest  #Paroxysmal A-fib, currently in NSR Plan -Hold metoprolol succinate 100 mg once daily and continue amiodarone 100 mg once daily -Continue Eliquis 5 mg twice daily. Hold Eliquis if platelets continue to downtrend, less than 50 K.  #Multivessel CAD (refused CABG and ICD) Plan -Keep Hb more than 8. Will transfuse with 1 unit of PRBC after adequate diuresis. -Patient not on aspirin due to Eliquis.  Continue high intensity statin, atorvastatin 40 mg nightly and Zetia 10 mg once daily.  #HLD Plan -Continue high intensity statin, atorvastatin 40 mg nightly and Zetia 10 mg once daily.  I have spent a total of 37 minutes with patient reviewing chart , telemetry, EKGs, labs and examining patient as well as establishing an assessment and plan that was discussed with the patient.  > 50% of time was spent in direct patient care.       Signed, Chalmers Guest, MD  12/15/2021, 11:15 AM

## 2021-12-15 NOTE — Plan of Care (Signed)
  Problem: Acute Rehab PT Goals(only PT should resolve) Goal: Pt Will Go Supine/Side To Sit Outcome: Progressing Flowsheets (Taken 12/15/2021 1546) Pt will go Supine/Side to Sit: with modified independence Goal: Patient Will Transfer Sit To/From Stand Outcome: Progressing Flowsheets (Taken 12/15/2021 1546) Patient will transfer sit to/from stand: with supervision Goal: Pt Will Transfer Bed To Chair/Chair To Bed Outcome: Progressing Flowsheets (Taken 12/15/2021 1546) Pt will Transfer Bed to Chair/Chair to Bed: with supervision Goal: Pt Will Ambulate Outcome: Progressing Flowsheets (Taken 12/15/2021 1546) Pt will Ambulate:  75 feet  with supervision  with rolling walker   3:46 PM, 12/15/21 Lonell Grandchild, MPT Physical Therapist with [facility] [phone] office [phone] mobile phone

## 2021-12-16 ENCOUNTER — Ambulatory Visit: Payer: Medicare Other

## 2021-12-16 DIAGNOSIS — I5043 Acute on chronic combined systolic (congestive) and diastolic (congestive) heart failure: Secondary | ICD-10-CM | POA: Diagnosis not present

## 2021-12-16 DIAGNOSIS — R531 Weakness: Secondary | ICD-10-CM | POA: Diagnosis not present

## 2021-12-16 LAB — TYPE AND SCREEN
ABO/RH(D): A POS
Antibody Screen: NEGATIVE
Unit division: 0

## 2021-12-16 LAB — CBC
HCT: 28 % — ABNORMAL LOW (ref 39.0–52.0)
Hemoglobin: 8.6 g/dL — ABNORMAL LOW (ref 13.0–17.0)
MCH: 27.9 pg (ref 26.0–34.0)
MCHC: 30.7 g/dL (ref 30.0–36.0)
MCV: 90.9 fL (ref 80.0–100.0)
Platelets: 78 10*3/uL — ABNORMAL LOW (ref 150–400)
RBC: 3.08 MIL/uL — ABNORMAL LOW (ref 4.22–5.81)
RDW: 17.6 % — ABNORMAL HIGH (ref 11.5–15.5)
WBC: 3.7 10*3/uL — ABNORMAL LOW (ref 4.0–10.5)
nRBC: 0 % (ref 0.0–0.2)

## 2021-12-16 LAB — BASIC METABOLIC PANEL
Anion gap: 8 (ref 5–15)
BUN: 73 mg/dL — ABNORMAL HIGH (ref 8–23)
CO2: 25 mmol/L (ref 22–32)
Calcium: 7.5 mg/dL — ABNORMAL LOW (ref 8.9–10.3)
Chloride: 100 mmol/L (ref 98–111)
Creatinine, Ser: 3.6 mg/dL — ABNORMAL HIGH (ref 0.61–1.24)
GFR, Estimated: 17 mL/min — ABNORMAL LOW (ref >60)
Glucose, Bld: 208 mg/dL — ABNORMAL HIGH (ref 70–99)
Potassium: 4.3 mmol/L (ref 3.5–5.1)
Sodium: 133 mmol/L — ABNORMAL LOW (ref 135–145)

## 2021-12-16 LAB — BPAM RBC
Blood Product Expiration Date: 202312082359
ISSUE DATE / TIME: 202311072034
Unit Type and Rh: 6200

## 2021-12-16 LAB — GLUCOSE, CAPILLARY
Glucose-Capillary: 218 mg/dL — ABNORMAL HIGH (ref 70–99)
Glucose-Capillary: 282 mg/dL — ABNORMAL HIGH (ref 70–99)
Glucose-Capillary: 275 mg/dL — ABNORMAL HIGH (ref 70–99)
Glucose-Capillary: 265 mg/dL — ABNORMAL HIGH (ref 70–99)

## 2021-12-16 LAB — MAGNESIUM: Magnesium: 2.7 mg/dL — ABNORMAL HIGH (ref 1.7–2.4)

## 2021-12-16 MED ORDER — INSULIN NPH (HUMAN) (ISOPHANE) 100 UNIT/ML ~~LOC~~ SUSP
14.0000 [IU] | Freq: Two times a day (BID) | SUBCUTANEOUS | Status: DC
  Administered 2021-12-16 – 2021-12-20 (×8): 14 [IU] via SUBCUTANEOUS
  Filled 2021-12-16: qty 10

## 2021-12-16 MED ORDER — SODIUM CHLORIDE 0.9 % IV SOLN
250.0000 mg | Freq: Every day | INTRAVENOUS | Status: AC
  Administered 2021-12-16 – 2021-12-19 (×4): 250 mg via INTRAVENOUS
  Filled 2021-12-16: qty 20
  Filled 2021-12-16 (×3): qty 250

## 2021-12-16 MED ORDER — DARBEPOETIN ALFA 300 MCG/0.6ML IJ SOSY
300.0000 ug | PREFILLED_SYRINGE | Freq: Once | INTRAMUSCULAR | Status: AC
  Administered 2021-12-16: 300 ug via SUBCUTANEOUS
  Filled 2021-12-16: qty 0.6

## 2021-12-16 NOTE — Progress Notes (Signed)
Subjective:  4200 of UOP -  negative 3300 on lasix 120 q 8 hours-  BUN and crt about the same-  sitting up in a bedside chair-  ate breakfast, no complaints Objective Vital signs in last 24 hours: Vitals:   12/16/21 0526 12/16/21 0700 12/16/21 0757 12/16/21 0810  BP:  (!) 141/49    Pulse:  70  69  Resp:  (!) '28 15 19  '$ Temp:    98.7 F (37.1 C)  TempSrc:    Oral  SpO2:  96%  94%  Weight: 98.2 kg      Weight change: 0.2 kg  Intake/Output Summary (Last 24 hours) at 12/16/2021 2683 Last data filed at 12/16/2021 4196 Gross per 24 hour  Intake 651.38 ml  Output 5050 ml  Net -4398.62 ml    Assessment/ Plan: Pt is a 76 y.o. yo male with CAD and also s/p renal transplant 15 years ago who was admitted on 12/14/2021 with  volume overload and A on CRF Assessment/Plan: 1. A on CRF-  baseline in the 2's but has worsened of late.  Was on farxiga as OP-  currently on hold.  No absolute indications for dialysis at this time 2. S/p renal transplant-  15 years ago-- continue prograf, cellcept and prednisone 3. CHF-  quite severe-  conservative management-  on vericiguat, lipitor 4. Afib-  amio and eliquis-  permanent Afib  5. Volume overload-  thankfully so far responding to IV lasix-  to continue.  Also an element of third spacing with dec albumin-  I think want to cont IV lasix for one more day then can transition to PO diuretics 6. Anemia-  not helping - s/p transfusion last night-   iron low in past-  will give iron and ESA    Louis Meckel    Labs: Basic Metabolic Panel: Recent Labs  Lab 12/14/21 1101 12/15/21 0342 12/16/21 0346  NA 134* 134* 133*  K 5.9* 4.9 4.3  CL 103 103 100  CO2 '23 23 25  '$ GLUCOSE 321* 148* 208*  BUN 73* 72* 73*  CREATININE 3.77* 3.74* 3.60*  CALCIUM 7.9* 7.7* 7.5*   Liver Function Tests: Recent Labs  Lab 12/14/21 1101 12/15/21 0342  AST 16 14*  ALT 15 15  ALKPHOS 74 67  BILITOT 1.5* 0.9  PROT 5.4* 5.1*  ALBUMIN 3.1* 2.9*   No results for  input(s): "LIPASE", "AMYLASE" in the last 168 hours. No results for input(s): "AMMONIA" in the last 168 hours. CBC: Recent Labs  Lab 12/14/21 1101 12/15/21 0342 12/16/21 0346  WBC 4.3 4.1 3.7*  HGB 7.4* 7.7* 8.6*  HCT 25.3* 26.6* 28.0*  MCV 94.4 93.7 90.9  PLT 72* 73* 78*   Cardiac Enzymes: No results for input(s): "CKTOTAL", "CKMB", "CKMBINDEX", "TROPONINI" in the last 168 hours. CBG: Recent Labs  Lab 12/14/21 2018 12/15/21 1131 12/15/21 1622 12/15/21 2121 12/16/21 0811  GLUCAP 265* 213* 274* 274* 218*    Iron Studies: No results for input(s): "IRON", "TIBC", "TRANSFERRIN", "FERRITIN" in the last 72 hours. Studies/Results: US Renal Transplant w/Doppler  Result Date: 12/14/2021 CLINICAL DATA:  Worsening kidney function.  Evaluate renal graft EXAM: ULTRASOUND OF RENAL TRANSPLANT WITH RENAL DOPPLER ULTRASOUND TECHNIQUE: Ultrasound examination of the renal transplant was performed with gray-scale, color and duplex doppler evaluation. COMPARISON:  11/19/2021 FINDINGS: Transplant kidney location: Right lower quadrant Transplant Kidney: Renal measurements: 9.8 x 6.2 x 6.8 cm = volume: 214.56m. Normal in size and parenchymal echogenicity. No evidence of hydronephrosis. No  peri-transplant fluid collection seen. Well-circumscribed anechoic cyst is identified measuring 3.2 x 1.8 x 2.6 cm. Similar to previous exam. Color flow in the main renal artery:  Yes Color flow in the main renal vein:  Yes Duplex Doppler Evaluation: Main Renal Artery Velocity: 93.1 cm/sec Main Renal Artery Resistive Index: 0.86 Venous waveform in main renal vein:  Present Intrarenal resistive index in upper pole:  0.78 (normal 0.6-0.8; equivocal 0.8-0.9; abnormal >= 0.9) Intrarenal resistive index in lower pole: 0.73 (normal 0.6-0.8; equivocal 0.8-0.9; abnormal >= 0.9) Bladder: Bladder is fully distended and appears normal. Ureteral jets not visualized however. Other findings: None. IMPRESSION: 1. Patent renal vasculature.  The intra renal resistive indices in the upper and lower pole are within normal limits. 2. No hydronephrosis. Electronically Signed   By: Kerby Moors M.D.   On: 12/14/2021 13:10   DG Chest 1 View  Result Date: 12/14/2021 CLINICAL DATA:  Multiple falls. EXAM: CHEST  1 VIEW COMPARISON:  November 16, 2021. FINDINGS: Stable cardiomediastinal silhouette. Both lungs are clear. Mildly displaced left sixth, seventh and eighth rib fractures. IMPRESSION: Mildly displaced left rib fractures as described above. No acute cardiopulmonary abnormality seen. Electronically Signed   By: Marijo Conception M.D.   On: 12/14/2021 11:43   DG Foot Complete Right  Result Date: 12/14/2021 CLINICAL DATA:  Multiple falls. EXAM: RIGHT FOOT COMPLETE - 3+ VIEW COMPARISON:  None Available. FINDINGS: There is no evidence of fracture or dislocation. There is no evidence of arthropathy. Mild posterior calcaneal spurring is noted. Vascular calcifications are noted. IMPRESSION: No acute abnormality is noted. Electronically Signed   By: Marijo Conception M.D.   On: 12/14/2021 11:40   DG Elbow Complete Right  Result Date: 12/14/2021 CLINICAL DATA:  Right elbow pain after fall. EXAM: RIGHT ELBOW - COMPLETE 3+ VIEW COMPARISON:  None Available. FINDINGS: There is no evidence of fracture, dislocation, or joint effusion. There is no evidence of arthropathy or other focal bone abnormality. Soft tissues are unremarkable. IMPRESSION: Negative. Electronically Signed   By: Marijo Conception M.D.   On: 12/14/2021 11:39   DG HIPS BILAT WITH PELVIS MIN 5 VIEWS  Result Date: 12/14/2021 CLINICAL DATA:  Multiple falls. EXAM: DG HIP (WITH OR WITHOUT PELVIS) 5+V BILAT COMPARISON:  October 29, 2021. FINDINGS: There is no evidence of hip fracture or dislocation. Mild to moderate osteophyte formation is seen involving both hips. 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 No acute abnormality seen. Electronically Signed    By: Marijo Conception M.D.   On: 12/14/2021 11:38   Medications: Infusions:  sodium chloride     furosemide 120 mg (12/16/21 0537)    Scheduled Medications:  sodium chloride   Intravenous Once   amiodarone  100 mg Oral Daily   apixaban  5 mg Oral BID   atorvastatin  40 mg Oral Daily   Chlorhexidine Gluconate Cloth  6 each Topical Q0600   ezetimibe  10 mg Oral Daily   insulin aspart  0-5 Units Subcutaneous QHS   insulin aspart  0-9 Units Subcutaneous TID WC   insulin NPH Human  10 Units Subcutaneous BID AC   mupirocin ointment  1 Application Nasal BID   mycophenolate  500 mg Oral BID   predniSONE  5 mg Oral Q breakfast   sodium chloride flush  3 mL Intravenous Q12H   tacrolimus  1 mg Oral BID   Vericiguat  2.5 mg Oral Daily    have  reviewed scheduled and prn medications.  Physical Exam: General: sitting up in bedside chair-  NAD Heart: RRR Lungs: mostly clear Abdomen: soft,non tender Extremities: pitting edema to lower legs    12/16/2021,8:38 AM  LOS: 2 days

## 2021-12-16 NOTE — Inpatient Diabetes Management (Signed)
Inpatient Diabetes Program Recommendations  AACE/ADA: New Consensus Statement on Inpatient Glycemic Control (2015)  Target Ranges:  Prepandial:   less than 140 mg/dL      Peak postprandial:   less than 180 mg/dL (1-2 hours)      Critically ill patients:  140 - 180 mg/dL   Lab Results  Component Value Date   GLUCAP 218 (H) 12/16/2021   HGBA1C 5.8 (H) 12/14/2021    Review of Glycemic Control  Latest Reference Range & Units 12/15/21 16:22 12/15/21 21:21 12/16/21 08:11  Glucose-Capillary 70 - 99 mg/dL 274 (H) 274 (H) 218 (H)  (H): Data is abnormally high Diabetes history: Type 2 DM Outpatient Diabetes medications: Regular 16 units B/ 13 units D, NPH 10 units BID, Farxiga 10 mg QD Current orders for Inpatient glycemic control: Novolog 0-9 units TID & HS, NPH 10 units BID Prednisone 5 mg QD  Inpatient Diabetes Program Recommendations:    Consider adding Novolog 4 units TID (Assuming patient is consuming >50% of meals)  Thanks, Bronson Curb, MSN, RNC-OB Diabetes Coordinator [phone] (8a-5p)   Thanks, Bronson Curb, MSN, RNC-OB Diabetes Coordinator [phone] (8a-5p)

## 2021-12-16 NOTE — Progress Notes (Signed)
PROGRESS NOTE   Daniel Myers  LAG:[id number] DOB: 09/18/1945 DOA: 12/14/2021 PCP: Daniel Fraise, MD   Chief Complaint  Patient presents with   Weakness   Level of care: Telemetry  Brief Admission History:  76 y.o. 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  He endorses lower extremity weakness and denies any loss of consciousness or head injury.  He is noted to have some bruising and lacerations on his extremities and notes increasing tiredness.  Much of this appears to have started since his NSTEMI approximately 1 month ago.  He has been taking Lasix as well as potassium since then and has had some intermittent gastrointestinal symptoms of epigastric abdominal pain as well as some mild diarrhea.  He does to have some pitting edema to bilateral knees.   ED Course: Vital signs stable and patient is afebrile.  He is noted to have hemoglobin 7.4 and platelet count of 72,000.  Potassium 5.9 in the setting of creatinine 3.77 with baseline near 2 from several weeks to months ago.  Troponin 127 and glucose 321.  He is noted to have some left-sided rib fractures likely related to the fall.  He has been given Lokelma and calcium gluconate per nephrology recommendations in the ED.     Assessment and Plan:  Acute combined HF - he remains on high dose IV furosemide with good results - plan is for him to remain on IV furosemide today - continue to monitor I/O closely - following electrolytes   Paroxysmal Afib - he is on apixaban for full anticoagulation  - cardiology holding metoprolol, remains on amio 100 mg daily   Hyperlipidemia - resume home atorvastation, ezetimibe  CAD - pt  reportedly had refused CABG and ICD - continue medical mgmt  Hyperkalemia - treated and resolved  Type 2 DM, with hyperglycemia  - increased NPH to 14 units BID with meals, added 3 am BS testing  Anemia of CKD - Hg 7.7, following  Thrombocypenia - 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  Objective: Vitals:   12/16/21 0900 12/16/21 1000 12/16/21 1100 12/16/21 1151  BP: (!) 126/49 (!) 134/42 (!) 110/43   Pulse: 71 83 72   Resp: 17 (!) 21 19   Temp:    98 F (36.7 Daniel)  TempSrc:    Oral  SpO2: 94% 96% 93%   Weight:        Intake/Output Summary (Last 24 hours) at 12/16/2021 1437 Last data filed at 12/16/2021 1414 Gross per 24 hour  Intake 388.34 ml  Output 6650 ml  Net -6261.66 ml   Filed Weights   12/15/21 0439 12/16/21 0526  Weight: 98 kg 98.2 kg   Examination:  General exam: Appears calm and comfortable  Respiratory system: Clear to auscultation. Respiratory effort normal. Cardiovascular system: normal S1 & S2  heard. No JVD, murmurs, rubs, gallops or clicks. No pedal edema. Gastrointestinal system: Abdomen is nondistended, soft and nontender. No organomegaly or masses felt. Normal bowel sounds heard. Central nervous system: Alert and oriented. No focal neurological deficits. Extremities: 1+ edema BLEs, Symmetric 5 x 5 power. Skin: No rashes, lesions or ulcers. Psychiatry: Judgement and insight appear normal. Mood & affect appropriate.   Data Reviewed: I have personally reviewed following labs and imaging studies  CBC: Recent Labs  Lab  12/14/21 1101 12/15/21 0342 12/16/21 0346  WBC 4.3 4.1 3.7*  HGB 7.4* 7.7* 8.6*  HCT 25.3* 26.6* 28.0*  MCV 94.4 93.7 90.9  PLT 72* 73* 78*    Basic Metabolic Panel: Recent Labs  Lab 12/14/21 1101 12/15/21 0342 12/16/21 0346  NA 134* 134* 133*  K 5.9* 4.9 4.3  CL 103 103 100  CO2 '23 23 25  '$ GLUCOSE 321* 148* 208*  BUN 73* 72* 73*  CREATININE 3.77* 3.74* 3.60*  CALCIUM 7.9* 7.7* 7.5*  MG  --  2.9* 2.7*    CBG: Recent Labs  Lab 12/15/21 1131 12/15/21 1622 12/15/21 2121 12/16/21 0811 12/16/21 1120  GLUCAP 213* 274* 274* 218* 282*    Recent Results (from the past 240 hour(s))  MRSA Next Gen by PCR, Nasal     Status: Abnormal   Collection Time: 12/14/21  5:34 PM   Specimen: Nasal Mucosa; Nasal Swab  Result Value Ref Range Status   MRSA by PCR Next Gen DETECTED (A) NOT DETECTED Final    Comment: RESULT CALLED TO, READ BACK BY AND VERIFIED WITH: Daniel Myers @ 1941 ON 12/14/21 Daniel Myers (NOTE) The GeneXpert MRSA Assay (FDA approved for NASAL specimens only), is one component of a comprehensive MRSA colonization surveillance program. It is not intended to diagnose MRSA infection nor to guide or monitor treatment for MRSA infections. Test performance is not FDA approved in patients less than 31 years old. Performed at [facility], [address] [place], [place] [address]      Radiology Studies: No results found.  Scheduled Meds:  sodium chloride   Intravenous Once   amiodarone  100 mg Oral Daily   apixaban  5 mg Oral BID   atorvastatin  40 mg Oral Daily   Chlorhexidine Gluconate Cloth  6 each Topical Q0600   ezetimibe  10 mg Oral Daily   insulin aspart  0-5 Units Subcutaneous QHS   insulin aspart  0-9 Units Subcutaneous TID WC   insulin NPH Human  14 Units Subcutaneous BID AC   mupirocin ointment  1 Application Nasal BID   mycophenolate  500 mg Oral BID   predniSONE  5 mg Oral Q breakfast   sodium chloride flush  3 mL Intravenous Q12H   tacrolimus  1  mg Oral BID   Vericiguat  2.5 mg Oral Daily   Continuous Infusions:  sodium chloride     ferric gluconate (FERRLECIT) IVPB 250 mg (12/16/21 1349)   furosemide 120 mg (12/16/21 0537)     LOS: 2 days   Time spent: 36 mins  Daniel Ayuso Wynetta Emery, MD How to contact the [facility] Attending or Consulting provider [place] or covering provider during after hours [place], for this patient?  Check the care team in [facility] and look for a) attending/consulting [facility] provider listed and b) the [facility] team listed Log into [email or web address] and use [place]'s universal password to access. If you do not have the password, please contact the hospital operator. Locate the [facility] provider  you are looking for under [facility] and page to a number that you can be directly reached. If you still have difficulty reaching the provider, please page the [facility] (Director on Call) for the Hospitalists listed on amion for assistance.  12/16/2021, 2:37 PM

## 2021-12-16 NOTE — Evaluation (Signed)
Occupational Therapy Evaluation Patient Details Name: Daniel Myers MRN: [id number] DOB: November 16, 1945 Today's Date: 12/16/2021   History of Present Illness Daniel Myers is a 76 y.o. 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  He endorses lower extremity weakness and denies any loss of consciousness or head injury.  He is noted to have some bruising and lacerations on his extremities and notes increasing tiredness.  Much of this appears to have started since his NSTEMI approximately 1 month ago.  He has been taking Lasix as well as potassium since then and has had some intermittent gastrointestinal symptoms of epigastric abdominal pain as well as some mild diarrhea.  He does to have some pitting edema to bilateral knees.   Clinical Impression   Pt agreeable to OT evaluation. Pt able to don shoes without assist and ambulate in room to sink to complete grooming. Only assist needed was verbal cuing to reach back to chair and bed for safety.  Pt is generally weak with more weakness in L UE which is typical at baseline due to previous stroke. Pt is near baseline levels for functional ADL status. Pt is not recommended for further acute OT services and will be discharged to care of nursing staff for remaining length of stay.      Recommendations for follow up therapy are one component of a multi-disciplinary discharge planning process, led by the attending physician.  Recommendations may be updated based on patient status, additional functional criteria and insurance authorization.   Follow Up Recommendations  No OT follow up    Assistance Recommended at Discharge PRN  Patient can return home with  the following Assist for transportation;Help with stairs or ramp for entrance;Assistance with cooking/housework    Functional Status Assessment  Patient has had a recent decline in their functional status and demonstrates the ability to make significant improvements in function in a reasonable and predictable amount of time.  Equipment Recommendations  None recommended by OT    Recommendations for Other Services       Precautions / Restrictions Precautions Precautions: Fall Restrictions Weight Bearing Restrictions: No      Mobility Bed Mobility Overal bed mobility: Modified Independent             General bed mobility comments: Sit to supine without physical assistance.    Transfers Overall transfer level: Needs assistance Equipment used: Rolling walker (2 wheels) Transfers: Sit to/from Stand, Bed to chair/wheelchair/BSC Sit to Stand: Supervision     Step pivot transfers: Supervision     General transfer comment: Pt needing verbal cues to reach for chair and feel the chair on back of legs prior to sitting. Same instruction needed for sitting on the bed.      Balance Overall balance assessment: Needs assistance Sitting-balance support: Feet supported, No upper extremity supported Sitting balance-Leahy Scale: Good Sitting balance - Comments: seated EOB   Standing balance support: Bilateral upper extremity supported, During functional activity Standing balance-Leahy Scale: Fair Standing balance comment: fair with RW                           ADL either performed or assessed with clinical judgement   ADL  Overall ADL's : Needs assistance/impaired                                     Functional mobility during ADLs: Supervision/safety;Rolling walker (2 wheels) General ADL Comments: Pt able to don shoes and ambulate to sink to wash hands without physical assistance. Cueing needed for safety when sittin on bed and chair.     Vision  Baseline Vision/History: 1 Wears glasses Ability to See in Adequate Light: 0 Adequate Patient Visual Report: No change from baseline Vision Assessment?: No apparent visual deficits                Pertinent Vitals/Pain Pain Assessment Pain Assessment: No/denies pain     Hand Dominance Right   Extremity/Trunk Assessment Upper Extremity Assessment Upper Extremity Assessment: Generalized weakness;LUE deficits/detail LUE Deficits / Details: L UE 4/5 MMT shoulder flexion. Weak at baseline from previous stroke. Pt reports increase in weakness due to having to wear blood pressure cuff on L UE.   Lower Extremity Assessment Lower Extremity Assessment: Defer to PT evaluation   Cervical / Trunk Assessment Cervical / Trunk Assessment: Kyphotic   Communication Communication Communication: No difficulties   Cognition Arousal/Alertness: Awake/alert Behavior During Therapy: WFL for tasks assessed/performed Overall Cognitive Status: Within Functional Limits for tasks assessed                                                        Home Living Family/patient expects to be discharged to:: Private residence Living Arrangements: Alone Available Help at Discharge: Family;Available 24 hours/day Type of Home: House Home Access: Stairs to enter;Ramped entrance Entrance Stairs-Number of Steps: 2   Home Layout: One level     Bathroom Shower/Tub: Occupational psychologist: Handicapped height Bathroom Accessibility: Yes   Home Equipment: [place] (2 wheels);Toilet riser;Shower seat;BSC/3in1;Grab bars - tub/shower   Additional Comments: History taken via PT note. Daughter reports pt has been using a RW the past 3 weeks in the home.      Prior Functioning/Environment Prior Level of Function : Independent/Modified Independent;Needs assist             Mobility Comments: household and short distanced community ambulator using  quad-cane ADLs Comments: Independent with household ADLs, assisted by family for community ADLs                      OT Goals(Current goals can be found in the care plan section) Acute Rehab OT Goals Patient Stated Goal: return home  OT Frequency:                                     End of Session Equipment Utilized During Treatment: Rolling walker (2 wheels)  Activity Tolerance: Patient tolerated treatment well Patient left: in bed;with call bell/phone within reach;with family/visitor present  OT Visit Diagnosis: Unsteadiness on feet (R26.81);Muscle weakness (generalized) (M62.81)                Time: 0949-1000 OT Time Calculation (min): 11 min Charges:  OT General Charges $OT Visit: 1 Visit OT Evaluation $OT Eval Low Complexity: 1 Low  [place] OT,  MOT  Larey Seat 12/16/2021, 10:30 AM

## 2021-12-16 NOTE — Progress Notes (Signed)
Progress Note  Patient Name: Daniel Myers Date of Encounter: 12/16/2021  Primary Cardiologist: Rozann Lesches, MD  Subjective   Patient stated significant improvement in his symptoms compared to yesterday.  He put out 4250 ml of urine in the last 24 hours with net negative of 3.3 L. He received 1 unit of blood transfusion yesterday as well.  Inpatient Medications    Scheduled Meds:  sodium chloride   Intravenous Once   amiodarone  100 mg Oral Daily   apixaban  5 mg Oral BID   atorvastatin  40 mg Oral Daily   Chlorhexidine Gluconate Cloth  6 each Topical Q0600   darbepoetin (ARANESP) injection - NON-DIALYSIS  300 mcg Subcutaneous Once   ezetimibe  10 mg Oral Daily   insulin aspart  0-5 Units Subcutaneous QHS   insulin aspart  0-9 Units Subcutaneous TID WC   insulin NPH Human  10 Units Subcutaneous BID AC   mupirocin ointment  1 Application Nasal BID   mycophenolate  500 mg Oral BID   predniSONE  5 mg Oral Q breakfast   sodium chloride flush  3 mL Intravenous Q12H   tacrolimus  1 mg Oral BID   Vericiguat  2.5 mg Oral Daily   Continuous Infusions:  sodium chloride     ferric gluconate (FERRLECIT) IVPB     furosemide 120 mg (12/16/21 0537)   PRN Meds: sodium chloride, acetaminophen **OR** acetaminophen, albuterol, ALPRAZolam, fluticasone, loratadine, ondansetron **OR** ondansetron (ZOFRAN) IV, sodium chloride flush   Vital Signs    Vitals:   12/16/21 0526 12/16/21 0700 12/16/21 0757 12/16/21 0810  BP:  (!) 141/49    Pulse:  70  69  Resp:  (!) '28 15 19  '$ Temp:    98.7 F (37.1 C)  TempSrc:    Oral  SpO2:  96%  94%  Weight: 98.2 kg       Intake/Output Summary (Last 24 hours) at 12/16/2021 0916 Last data filed at 12/16/2021 0721 Gross per 24 hour  Intake 651.38 ml  Output 5050 ml  Net -4398.62 ml   Filed Weights   12/15/21 0439 12/16/21 0526  Weight: 98 kg 98.2 kg    Telemetry     Personally reviewed, HR controlled at 50-60s.  ECG    An ECG dated  12/14/2021 was personally reviewed today and demonstrated:  Sinus bradycardia  Physical Exam   GEN: No acute distress.   Neck: JVD+ Cardiac: Bibasilar rales +, S1 S2 heard Respiratory: Nonlabored..Bibasilar rales + GI: Abdominal distention present MS: 1-2+ pitting edema in LE; No deformity. Neuro:  Nonfocal. Psych: Alert and oriented x 3. Normal affect.  Labs    Chemistry Recent Labs  Lab 12/14/21 1101 12/15/21 0342 12/16/21 0346  NA 134* 134* 133*  K 5.9* 4.9 4.3  CL 103 103 100  CO2 '23 23 25  '$ GLUCOSE 321* 148* 208*  BUN 73* 72* 73*  CREATININE 3.77* 3.74* 3.60*  CALCIUM 7.9* 7.7* 7.5*  PROT 5.4* 5.1*  --   ALBUMIN 3.1* 2.9*  --   AST 16 14*  --   ALT 15 15  --   ALKPHOS 74 67  --   BILITOT 1.5* 0.9  --   GFRNONAA 16* 16* 17*  ANIONGAP '8 8 8     '$ Hematology Recent Labs  Lab 12/14/21 1101 12/15/21 0342 12/16/21 0346  WBC 4.3 4.1 3.7*  RBC 2.68* 2.84* 3.08*  HGB 7.4* 7.7* 8.6*  HCT 25.3* 26.6* 28.0*  MCV 94.4 93.7  90.9  MCH 27.6 27.1 27.9  MCHC 29.2* 28.9* 30.7  RDW 18.4* 18.6* 17.6*  PLT 72* 73* 78*    Cardiac Enzymes Recent Labs  Lab 11/16/21 1910 11/16/21 2113 11/17/21 0824 12/14/21 1101 12/14/21 1357  TROPONINIHS 672* 1,331* 5,874* 127* 130*    BNP Recent Labs  Lab 12/14/21 1101  BNP >4,500.0*     DDimerNo results for input(s): "DDIMER" in the last 168 hours.   Cardiac Studies  Echo October 2023 LVEF 20 to 09% Grade 3 diastolic dysfunction (I suspect this is a reversible restrictive)  LHC in 2022 Multivessel CAD, refused CABG and high risk candidate for PCI Refused ICD   Assessment & Plan   Patient is a 76 year old M known to have multivessel CAD in 2022 with LVEF 25 to 30% (refused CABG and ICD but will be high risk of LAD CTO intervention) in 10/23, paroxysmal A-fib status post DCCV in 10/2020, history of CVA, HTN, kidney transplant 15 years ago c/w stage III CKD who is currently followed by cardiology for the management of  ADHF.  #Acute systolic and diastolic heart failure exacerbation Plan -Patient is currently on IV Lasix bolus doses per nephrology recommendations although drip would be ideal due to soft blood pressures. Continue IV Lasix '120mg'$  Q8h. obtain CMP every 12 hours.  Keep K>4 and <5. Keep Mg>2 and <3. -Hold metoprolol succinate and continue amiodarone 100 mg once daily -Patient did not tolerate ACE inhibitor due to hyperkalemia, did not tolerate Entresto due to hypotension, did not tolerate MRA either. Hold SGLT2 inhibitors. -Daily weights, ins and outs, 1.2 L fluid restriction -Patient refused ICD and hence not a candidate for LifeVest  #Paroxysmal A-fib, currently in NSR Plan -Hold metoprolol succinate and continue amiodarone 100 mg once daily -Continue Eliquis 5 mg twice daily.  Hold Eliquis if platelets continue to downtrend, less than 50 K.  #Multivessel CAD (refused CABG and ICD) Plan -Keep Hb more than 8 -Patient not on aspirin due to Eliquis.  Continue high intensity statin, atorvastatin 40 mg nightly and Zetia 10 mg once daily  #HLD Plan -Continue high intensity statin, atorvastatin 40 mg nightly and Zetia 10 mg once daily  I have spent a total of 37 minutes with patient reviewing chart , telemetry, EKGs, labs and examining patient as well as establishing an assessment and plan that was discussed with the patient.  > 50% of time was spent in direct patient care.       Signed, Chalmers Guest, MD  12/16/2021, 9:16 AM

## 2021-12-16 NOTE — Hospital Course (Signed)
76 y.o. 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  He endorses lower extremity weakness and denies any loss of consciousness or head injury.  He is noted to have some bruising and lacerations on his extremities and notes increasing tiredness.  Much of this appears to have started since his NSTEMI approximately 1 month ago.  He has been taking Lasix as well as potassium since then and has had some intermittent gastrointestinal symptoms of epigastric abdominal pain as well as some mild diarrhea.  He does to have some pitting edema to bilateral knees.   ED Course: Vital signs stable and patient is afebrile.  He is noted to have hemoglobin 7.4 and platelet count of 72,000.  Potassium 5.9 in the setting of creatinine 3.77 with baseline near 2 from several weeks to months ago.  Troponin 127 and glucose 321.  He is noted to have some left-sided rib fractures likely related to the fall.  He has been given Lokelma and calcium gluconate per nephrology recommendations in the ED.

## 2021-12-17 ENCOUNTER — Encounter (HOSPITAL_COMMUNITY): Payer: Medicare Other

## 2021-12-17 DIAGNOSIS — R531 Weakness: Secondary | ICD-10-CM | POA: Diagnosis not present

## 2021-12-17 DIAGNOSIS — I5043 Acute on chronic combined systolic (congestive) and diastolic (congestive) heart failure: Secondary | ICD-10-CM | POA: Diagnosis not present

## 2021-12-17 DIAGNOSIS — Z66 Do not resuscitate: Secondary | ICD-10-CM | POA: Diagnosis not present

## 2021-12-17 DIAGNOSIS — I10 Essential (primary) hypertension: Secondary | ICD-10-CM | POA: Diagnosis not present

## 2021-12-17 DIAGNOSIS — I480 Paroxysmal atrial fibrillation: Secondary | ICD-10-CM

## 2021-12-17 LAB — MAGNESIUM: Magnesium: 2.5 mg/dL — ABNORMAL HIGH (ref 1.7–2.4)

## 2021-12-17 LAB — GLUCOSE, CAPILLARY
Glucose-Capillary: 167 mg/dL — ABNORMAL HIGH (ref 70–99)
Glucose-Capillary: 181 mg/dL — ABNORMAL HIGH (ref 70–99)
Glucose-Capillary: 297 mg/dL — ABNORMAL HIGH (ref 70–99)
Glucose-Capillary: 266 mg/dL — ABNORMAL HIGH (ref 70–99)

## 2021-12-17 LAB — BASIC METABOLIC PANEL
Anion gap: 10 (ref 5–15)
BUN: 68 mg/dL — ABNORMAL HIGH (ref 8–23)
CO2: 26 mmol/L (ref 22–32)
Calcium: 7.7 mg/dL — ABNORMAL LOW (ref 8.9–10.3)
Chloride: 99 mmol/L (ref 98–111)
Creatinine, Ser: 3.43 mg/dL — ABNORMAL HIGH (ref 0.61–1.24)
GFR, Estimated: 18 mL/min — ABNORMAL LOW (ref >60)
Glucose, Bld: 166 mg/dL — ABNORMAL HIGH (ref 70–99)
Potassium: 3.7 mmol/L (ref 3.5–5.1)
Sodium: 135 mmol/L (ref 135–145)

## 2021-12-17 LAB — CBC
HCT: 31.2 % — ABNORMAL LOW (ref 39.0–52.0)
Hemoglobin: 9.6 g/dL — ABNORMAL LOW (ref 13.0–17.0)
MCH: 27.8 pg (ref 26.0–34.0)
MCHC: 30.8 g/dL (ref 30.0–36.0)
MCV: 90.4 fL (ref 80.0–100.0)
Platelets: 93 10*3/uL — ABNORMAL LOW (ref 150–400)
RBC: 3.45 MIL/uL — ABNORMAL LOW (ref 4.22–5.81)
RDW: 17.5 % — ABNORMAL HIGH (ref 11.5–15.5)
WBC: 4 10*3/uL (ref 4.0–10.5)
nRBC: 0 % (ref 0.0–0.2)

## 2021-12-17 MED ORDER — FUROSEMIDE 40 MG PO TABS
40.0000 mg | ORAL_TABLET | Freq: Two times a day (BID) | ORAL | Status: DC
  Administered 2021-12-17: 40 mg via ORAL
  Filled 2021-12-17: qty 1

## 2021-12-17 MED ORDER — SPIRONOLACTONE 12.5 MG HALF TABLET
12.5000 mg | ORAL_TABLET | Freq: Every day | ORAL | Status: DC
  Administered 2021-12-17 – 2021-12-20 (×4): 12.5 mg via ORAL
  Filled 2021-12-17 (×4): qty 1

## 2021-12-17 MED ORDER — FUROSEMIDE 10 MG/ML IJ SOLN
80.0000 mg | Freq: Three times a day (TID) | INTRAMUSCULAR | Status: DC
  Administered 2021-12-17 – 2021-12-18 (×3): 80 mg via INTRAVENOUS
  Filled 2021-12-17 (×4): qty 8

## 2021-12-17 NOTE — Progress Notes (Signed)
   Subjective:  Patient is feeling much better and inquires about potential discharge. No acute events overnight.  Objective:  Vital signs in last 24 hours: Vitals:   12/16/21 1743 12/16/21 1917 12/16/21 2359 12/17/21 0454  BP: (!) 139/58 132/73 (!) 146/62 (!) 128/53  Pulse: 66 69 71 72  Resp: '18 20 18 16  '$ Temp:  98.2 F (36.8 C) 98.4 F (36.9 C) 98.9 F (37.2 C)  TempSrc:  Oral Oral Oral  SpO2: 97% 97% 96% 100%  Weight:    95.9 kg   Weight change: -2.3 kg  Intake/Output Summary (Last 24 hours) at 12/17/2021 1140 Last data filed at 12/17/2021 1000 Gross per 24 hour  Intake 419.77 ml  Output 4750 ml  Net -4330.23 ml   Physical Examination   General: sitting up in bed, in no acute distress Head: normocephalic, atraumatic Cardiovascular: regular rate and rhythm without murmurs, rubs, or gallops, 1+ pitting edema (continuing to improve) Respiratory: normal respiratory effort, lungs CTAB Abdominal: normoactive bowel sounds, no tenderness to palpation Skin: warm, dry Central Nervous System: alert and oriented x 3 Psychiatric: Normal mood and affect   Assessment/Plan:  Principal Problem:   Weakness Active Problems:   Essential hypertension   Insulin dependent type 2 diabetes mellitus (HCC)   Mixed hyperlipidemia   CKD (chronic kidney disease) stage 4, GFR 15-29 ml/min (HCC)   Thrombocytopenia (HCC)   Cerebrovascular accident (CVA) ([place])   Diabetic nephropathy associated with type 2 diabetes mellitus ([place])   Immunosuppression ([place])   Long-term use of immunosuppressant medication   NSTEMI (non-ST elevated myocardial infarction) ([place])   DNR (do not resuscitate)   CAD (coronary artery disease)/Prior MI   Obesity (BMI 30-39.9)   Acute on chronic congestive heart failure ([place])   Acute combined HF Patient has responded well to IV Lasix. Per Cardiology, patient will continue on IV Lasix for another day or so. - continue to monitor I/O closely - following electrolytes     Paroxysmal Afib - he is on apixaban for full anticoagulation  - cardiology holding metoprolol, remains on amio 100 mg daily    Hyperlipidemia - resume home atorvastation, ezetimibe   CAD - pt reportedly had refused CABG and ICD - continue medical mgmt   Hyperkalemia - treated and resolved   Type 2 DM, with hyperglycemia  CBG 181. - Continue NPH to 14 units BID with meals, added 3 am BS testing   Anemia of CKD Hgb is 9.6 -Monitor CBC   Thrombocypenia - may need to hold apixaban if platelets drop below 50k (currently 93)   Obesity  - BMP 31-32 - lifestyle modifications recommended   LOS: 3 days   Stanford Breed, Medical Student 12/17/2021, 11:40 AM

## 2021-12-17 NOTE — Progress Notes (Signed)
Subjective:  5600 of UOP -  negative 5300 on lasix 120 q 8 hours-  BUN and crt a little better, moved out of ICU-  says night was rough because he peed so much  Objective Vital signs in last 24 hours: Vitals:   12/16/21 1743 12/16/21 1917 12/16/21 2359 12/17/21 0454  BP: (!) 139/58 132/73 (!) 146/62 (!) 128/53  Pulse: 66 69 71 72  Resp: '18 20 18 16  '$ Temp:  98.2 F (36.8 C) 98.4 F (36.9 C) 98.9 F (37.2 C)  TempSrc:  Oral Oral Oral  SpO2: 97% 97% 96% 100%  Weight:    95.9 kg   Weight change: -2.3 kg  Intake/Output Summary (Last 24 hours) at 12/17/2021 0746 Last data filed at 12/17/2021 0450 Gross per 24 hour  Intake 299.77 ml  Output 4850 ml  Net -4550.23 ml    Assessment/ Plan: Pt is a 76 y.o. yo male with CAD and also s/p renal transplant 15 years ago who was admitted on 12/14/2021 with  volume overload and A on CRF Assessment/Plan: 1. A on CRF-  baseline in the 2's but has worsened of late.  Was on farxiga as OP-  currently on hold, would continue to hold-  not sure I want pt on this med at this time.  No indications for dialysis at this time-   thankfully his BUN and crt are heading in the right direction and his volume status is better 2. S/p renal transplant-  15 years ago-- continue prograf, cellcept and prednisone 3. CHF-  quite severe-  conservative management-  on vericiguat, lipitoro  other heart failure meds difficult due to CKD 4. Afib-  amio and eliquis-  permanent Afib -  rate controlled 5. Volume overload-  thankfully responded to IV lasix-  says weight only down 2 kg but appears 10 liters negative per I's and O's.  Also an element of third spacing with dec albumin-  will transition to PO diuretics today  6. Anemia-  not helping - s/p transfusion last night-   iron low in past-  giving iron and ESA  7. Potassium-  was high upon admission but corrected pretty quickly-  on lokelma as OP ?  Also aldactone-  not on either here and K is trending down.  Will add back low  dose aldactone  8. Dispo-  I think approaching discharge -  will need f/u with cards and his nephrologist Joseph Berkshire)   Louis Meckel    Labs: Basic Metabolic Panel: Recent Labs  Lab 12/15/21 0342 12/16/21 0346 12/17/21 0414  NA 134* 133* 135  K 4.9 4.3 3.7  CL 103 100 99  CO2 '23 25 26  '$ GLUCOSE 148* 208* 166*  BUN 72* 73* 68*  CREATININE 3.74* 3.60* 3.43*  CALCIUM 7.7* 7.5* 7.7*   Liver Function Tests: Recent Labs  Lab 12/14/21 1101 12/15/21 0342  AST 16 14*  ALT 15 15  ALKPHOS 74 67  BILITOT 1.5* 0.9  PROT 5.4* 5.1*  ALBUMIN 3.1* 2.9*   No results for input(s): "LIPASE", "AMYLASE" in the last 168 hours. No results for input(s): "AMMONIA" in the last 168 hours. CBC: Recent Labs  Lab 12/14/21 1101 12/15/21 0342 12/16/21 0346 12/17/21 0414  WBC 4.3 4.1 3.7* 4.0  HGB 7.4* 7.7* 8.6* 9.6*  HCT 25.3* 26.6* 28.0* 31.2*  MCV 94.4 93.7 90.9 90.4  PLT 72* 73* 78* 93*   Cardiac Enzymes: No results for input(s): "CKTOTAL", "CKMB", "CKMBINDEX", "TROPONINI" in the last 168  hours. CBG: Recent Labs  Lab 12/16/21 0811 12/16/21 1120 12/16/21 1556 12/16/21 1954 12/17/21 0722  GLUCAP 218* 282* 275* 265* 167*    Iron Studies: No results for input(s): "IRON", "TIBC", "TRANSFERRIN", "FERRITIN" in the last 72 hours. Studies/Results: No results found. Medications: Infusions:  sodium chloride     ferric gluconate (FERRLECIT) IVPB 250 mg (12/16/21 1349)   furosemide 120 mg (12/16/21 2126)    Scheduled Medications:  sodium chloride   Intravenous Once   amiodarone  100 mg Oral Daily   apixaban  5 mg Oral BID   atorvastatin  40 mg Oral Daily   Chlorhexidine Gluconate Cloth  6 each Topical Q0600   ezetimibe  10 mg Oral Daily   insulin aspart  0-5 Units Subcutaneous QHS   insulin aspart  0-9 Units Subcutaneous TID WC   insulin NPH Human  14 Units Subcutaneous BID AC   mupirocin ointment  1 Application Nasal BID   mycophenolate  500 mg Oral BID   predniSONE  5  mg Oral Q breakfast   sodium chloride flush  3 mL Intravenous Q12H   tacrolimus  1 mg Oral BID   Vericiguat  2.5 mg Oral Daily    have reviewed scheduled and prn medications.  Physical Exam: General:   NAD Heart: RRR Lungs: mostly clear Abdomen: soft,non tender Extremities: still with pitting edema to lower legs    12/17/2021,7:46 AM  LOS: 3 days

## 2021-12-17 NOTE — Care Management Important Message (Signed)
Important Message  Patient Details  Name: Daniel Myers MRN: [id number] Date of Birth: 1945-05-28   Medicare Important Message Given:  Yes     Tommy Medal 12/17/2021, 11:34 AM

## 2021-12-17 NOTE — Progress Notes (Signed)
Rounding Note    Patient Name: Daniel Myers Date of Encounter: 12/17/2021  [place] Cardiologist: Rozann Lesches, MD   Subjective   Breathing improved. No chest pain or palpitations. Anxious to go home as he has not been sleeping well while in the hospital.   Inpatient Medications    Scheduled Meds:  sodium chloride   Intravenous Once   amiodarone  100 mg Oral Daily   apixaban  5 mg Oral BID   atorvastatin  40 mg Oral Daily   Chlorhexidine Gluconate Cloth  6 each Topical Q0600   ezetimibe  10 mg Oral Daily   furosemide  40 mg Oral BID   insulin aspart  0-5 Units Subcutaneous QHS   insulin aspart  0-9 Units Subcutaneous TID WC   insulin NPH Human  14 Units Subcutaneous BID AC   mupirocin ointment  1 Application Nasal BID   mycophenolate  500 mg Oral BID   predniSONE  5 mg Oral Q breakfast   sodium chloride flush  3 mL Intravenous Q12H   spironolactone  12.5 mg Oral QHS   tacrolimus  1 mg Oral BID   Vericiguat  2.5 mg Oral Daily   Continuous Infusions:  sodium chloride     ferric gluconate (FERRLECIT) IVPB 250 mg (12/17/21 0818)   PRN Meds: sodium chloride, acetaminophen **OR** acetaminophen, albuterol, ALPRAZolam, fluticasone, loratadine, ondansetron **OR** ondansetron (ZOFRAN) IV, sodium chloride flush   Vital Signs    Vitals:   12/16/21 1743 12/16/21 1917 12/16/21 2359 12/17/21 0454  BP: (!) 139/58 132/73 (!) 146/62 (!) 128/53  Pulse: 66 69 71 72  Resp: '18 20 18 16  '$ Temp:  98.2 F (36.8 C) 98.4 F (36.9 C) 98.9 F (37.2 C)  TempSrc:  Oral Oral Oral  SpO2: 97% 97% 96% 100%  Weight:    95.9 kg    Intake/Output Summary (Last 24 hours) at 12/17/2021 1129 Last data filed at 12/17/2021 1000 Gross per 24 hour  Intake 419.77 ml  Output 4750 ml  Net -4330.23 ml      12/17/2021    4:54 AM 12/16/2021    5:26 AM 12/15/2021    4:39 AM  Last 3 Weights  Weight (lbs) 211 lb 6.7 oz 216 lb 7.9 oz 216 lb 0.8 oz  Weight (kg) 95.9 kg 98.2 kg 98 kg       Telemetry    NSR, HR in 70's to 80's.  - Personally Reviewed  ECG    No new tracings.   Physical Exam   GEN: Pleasant male appearing in no acute distress.   Neck: JVD at 8-9 cm.  Cardiac: RRR, no murmurs, rubs, or gallops.  Respiratory: Clear to auscultation bilaterally without wheezing or rales. GI: Soft, nontender, non-distended  MS: 1+ pitting edema bilaterally; No deformity. Neuro:  Nonfocal  Psych: Normal affect   Labs    High Sensitivity Troponin:   Recent Labs  Lab 12/14/21 1101 12/14/21 1357  TROPONINIHS 127* 130*     Chemistry Recent Labs  Lab 12/14/21 1101 12/15/21 0342 12/16/21 0346 12/17/21 0414  NA 134* 134* 133* 135  K 5.9* 4.9 4.3 3.7  CL 103 103 100 99  CO2 '23 23 25 26  '$ GLUCOSE 321* 148* 208* 166*  BUN 73* 72* 73* 68*  CREATININE 3.77* 3.74* 3.60* 3.43*  CALCIUM 7.9* 7.7* 7.5* 7.7*  MG  --  2.9* 2.7* 2.5*  PROT 5.4* 5.1*  --   --   ALBUMIN 3.1* 2.9*  --   --  AST 16 14*  --   --   ALT 15 15  --   --   ALKPHOS 74 67  --   --   BILITOT 1.5* 0.9  --   --   GFRNONAA 16* 16* 17* 18*  ANIONGAP '8 8 8 10    '$ Lipids No results for input(s): "CHOL", "TRIG", "HDL", "LABVLDL", "LDLCALC", "CHOLHDL" in the last 168 hours.  Hematology Recent Labs  Lab 12/15/21 0342 12/16/21 0346 12/17/21 0414  WBC 4.1 3.7* 4.0  RBC 2.84* 3.08* 3.45*  HGB 7.7* 8.6* 9.6*  HCT 26.6* 28.0* 31.2*  MCV 93.7 90.9 90.4  MCH 27.1 27.9 27.8  MCHC 28.9* 30.7 30.8  RDW 18.6* 17.6* 17.5*  PLT 73* 78* 93*   Thyroid No results for input(s): "TSH", "FREET4" in the last 168 hours.  BNP Recent Labs  Lab 12/14/21 1101  BNP >4,500.0*    DDimer No results for input(s): "DDIMER" in the last 168 hours.   Radiology    No results found.  Cardiac Studies   Echocardiogram: 11/17/2021 IMPRESSIONS     1. Left ventricular ejection fraction, by estimation, is 20 to 25%. The  left ventricle has severely decreased function. The left ventricle  demonstrates global  hypokinesis. Left ventricular diastolic parameters are  consistent with Grade III diastolic  dysfunction (restrictive).   2. Right ventricular systolic function is mildly reduced. The right  ventricular size is normal.   3. Left atrial size was moderately dilated.   4. Right atrial size was moderately dilated.   5. The mitral valve is normal in structure. No evidence of mitral valve  regurgitation. No evidence of mitral stenosis.   6. The aortic valve is tricuspid. Aortic valve regurgitation is not  visualized. No aortic stenosis is present.   7. The inferior vena cava is normal in size with greater than 50%  respiratory variability, suggesting right atrial pressure of 3 mmHg.   Comparison(s): Prior images reviewed side by side. Prior EF described as  30-35%.    Patient Profile     76 y.o. male w/PMH of CAD (s/p cath in 09/2020 showing severe 3-vessel CAD and patient declined CABG and could possibly intervene on LAD but would be CTO and high-risk, therefore medical management recommended, NSTEMI in 11/2021 and medically managed at that time), HFrEF (EF 30-35% in 10/2021, at 20-25% in 11/2021), paroxysmal atrial fibrillation (s/p DCCV in 10/2020 and has been continued on Amiodarone), HTN, HLD, Stage 3 CKD and history of CVA who is currently admitted for an acute CHF exacerbation.   Assessment & Plan    1. Acute HFrEF - He has a known cardiomyopathy with EF at 20-25% on most recent check and he has refused further invasive cardiac interventions, including ICD placement.  - Was receiving IV Lasix '120mg'$  Q8H and Nephrology already switched him to PO Lasix '40mg'$  BID starting today. Recorded net output of -9.9 L thus far and weight down to 211 lbs (reported baseline of 212 - 214 lbs).  - Nephrology has recommended to continuing to hold [place]. Restarted on Spironolactone 12.'5mg'$  daily by Nephrology but will need to follow K+ closely given issues with hyperkalemia in the past. Remains on Verquvo.  Can hopefully restart Toprol-XL at a lower dose prior to discharge. No longer on ACE-I or ARB due to hyperkalemia/worsening renal function and not a candidate for Entresto given his renal function and soft BP.   2. CAD - He is s/p cath in 09/2020 showing severe 3-vessel CAD and  patient declined CABG and could possibly intervene on LAD but would be CTO and high-risk, therefore medical management was recommended. He denies any recent anginal symptoms.  - Continue Atorvastatin and Zetia. No ASA given the need for anticoagulation. Would plan to restart beta-blocker therapy prior to discharge.   3. Paroxysmal Atrial Fibrillation - Maintaining NSR this admission. On Amiodarone '100mg'$  daily. Was on Toprol-XL '100mg'$  daily as an outpatient but initially held given hypotension. Now improved (BP at 104/34 - 156/54 in the past 24 hours). Given improvement in his BP and respiratory status, would consider restarting at a lower dose of '25mg'$  daily.  - On Eliquis '5mg'$  BID for anticoagulation which is the appropriate dose given his age, weight and renal function. Platelet count improving and at 93K today.   4. HLD - LDL at 34 in 11/2021. Remains on Atorvastatin '40mg'$  daily and Zetia '10mg'$  daily.   5. Stage 3- 4 CKD - Nephrology following. Creatinine peaked at 3.77, improved to 3.43 today.    For questions or updates, please contact [place] Please consult [email or web address] for contact info under        Signed, Erma Heritage, PA-C  12/17/2021, 11:29 AM

## 2021-12-18 DIAGNOSIS — Z66 Do not resuscitate: Secondary | ICD-10-CM | POA: Diagnosis not present

## 2021-12-18 DIAGNOSIS — R531 Weakness: Secondary | ICD-10-CM | POA: Diagnosis not present

## 2021-12-18 DIAGNOSIS — I480 Paroxysmal atrial fibrillation: Secondary | ICD-10-CM

## 2021-12-18 DIAGNOSIS — I5043 Acute on chronic combined systolic (congestive) and diastolic (congestive) heart failure: Secondary | ICD-10-CM | POA: Diagnosis not present

## 2021-12-18 LAB — URINALYSIS, COMPLETE (UACMP) WITH MICROSCOPIC
Bilirubin Urine: NEGATIVE
Glucose, UA: NEGATIVE mg/dL
Ketones, ur: NEGATIVE mg/dL
Nitrite: NEGATIVE
Protein, ur: 100 mg/dL — AB
Specific Gravity, Urine: 1.009 (ref 1.005–1.030)
WBC, UA: >50 WBC/hpf — ABNORMAL HIGH (ref 0–5)
pH: 7 (ref 5.0–8.0)

## 2021-12-18 LAB — BASIC METABOLIC PANEL
Anion gap: 11 (ref 5–15)
BUN: 63 mg/dL — ABNORMAL HIGH (ref 8–23)
CO2: 26 mmol/L (ref 22–32)
Calcium: 7.7 mg/dL — ABNORMAL LOW (ref 8.9–10.3)
Chloride: 99 mmol/L (ref 98–111)
Creatinine, Ser: 3.31 mg/dL — ABNORMAL HIGH (ref 0.61–1.24)
GFR, Estimated: 19 mL/min — ABNORMAL LOW (ref >60)
Glucose, Bld: 160 mg/dL — ABNORMAL HIGH (ref 70–99)
Potassium: 3.9 mmol/L (ref 3.5–5.1)
Sodium: 136 mmol/L (ref 135–145)

## 2021-12-18 LAB — GLUCOSE, CAPILLARY
Glucose-Capillary: 165 mg/dL — ABNORMAL HIGH (ref 70–99)
Glucose-Capillary: 139 mg/dL — ABNORMAL HIGH (ref 70–99)
Glucose-Capillary: 232 mg/dL — ABNORMAL HIGH (ref 70–99)
Glucose-Capillary: 203 mg/dL — ABNORMAL HIGH (ref 70–99)
Glucose-Capillary: 263 mg/dL — ABNORMAL HIGH (ref 70–99)

## 2021-12-18 LAB — CBC
HCT: 33 % — ABNORMAL LOW (ref 39.0–52.0)
Hemoglobin: 10.1 g/dL — ABNORMAL LOW (ref 13.0–17.0)
MCH: 27.6 pg (ref 26.0–34.0)
MCHC: 30.6 g/dL (ref 30.0–36.0)
MCV: 90.2 fL (ref 80.0–100.0)
Platelets: 115 10*3/uL — ABNORMAL LOW (ref 150–400)
RBC: 3.66 MIL/uL — ABNORMAL LOW (ref 4.22–5.81)
RDW: 17.6 % — ABNORMAL HIGH (ref 11.5–15.5)
WBC: 5.5 10*3/uL (ref 4.0–10.5)
nRBC: 0 % (ref 0.0–0.2)

## 2021-12-18 LAB — MAGNESIUM: Magnesium: 2.4 mg/dL (ref 1.7–2.4)

## 2021-12-18 MED ORDER — FENTANYL CITRATE PF 50 MCG/ML IJ SOSY
12.5000 ug | PREFILLED_SYRINGE | INTRAMUSCULAR | Status: DC | PRN
  Administered 2021-12-18 – 2021-12-20 (×5): 25 ug via INTRAVENOUS
  Filled 2021-12-18 (×5): qty 1

## 2021-12-18 MED ORDER — SODIUM CHLORIDE 0.9 % IV SOLN
2.0000 g | INTRAVENOUS | Status: DC
  Administered 2021-12-18 – 2021-12-20 (×3): 2 g via INTRAVENOUS
  Filled 2021-12-18 (×3): qty 20

## 2021-12-18 MED ORDER — PHENAZOPYRIDINE HCL 100 MG PO TABS
100.0000 mg | ORAL_TABLET | Freq: Once | ORAL | Status: AC
  Administered 2021-12-18: 100 mg via ORAL

## 2021-12-18 MED ORDER — FUROSEMIDE 10 MG/ML IJ SOLN
60.0000 mg | Freq: Two times a day (BID) | INTRAMUSCULAR | Status: DC
  Administered 2021-12-19 – 2021-12-21 (×3): 60 mg via INTRAVENOUS
  Filled 2021-12-18 (×5): qty 6

## 2021-12-18 MED ORDER — PHENAZOPYRIDINE HCL 100 MG PO TABS
200.0000 mg | ORAL_TABLET | Freq: Once | ORAL | Status: DC
  Filled 2021-12-18: qty 2

## 2021-12-18 NOTE — Progress Notes (Signed)
Subjective:  2500 of UOP -  negative 1000 still on IV lasix per cardiology-  BUN and crt a little better still   Objective Vital signs in last 24 hours: Vitals:   12/17/21 1100 12/17/21 2024 12/18/21 0500 12/18/21 0556  BP: (!) 124/54 (!) 124/57  (!) 111/58  Pulse: 72 66  78  Resp: (!) '22 20  18  '$ Temp: 99 F (37.2 C) 98.2 F (36.8 C)  98.1 F (36.7 C)  TempSrc: Oral Oral  Oral  SpO2: 93% 96%  93%  Weight:   91.1 kg    Weight change: -4.8 kg  Intake/Output Summary (Last 24 hours) at 12/18/2021 0743 Last data filed at 12/18/2021 0559 Gross per 24 hour  Intake 1460.45 ml  Output 2500 ml  Net -1039.55 ml    Assessment/ Plan: Pt is a 76 y.o. yo male with CAD and also s/p renal transplant 15 years ago who was admitted on 12/14/2021 with  volume overload and A on CRF Assessment/Plan: 1. A on CRF-  baseline in the 2's but has worsened of late.  Has a recent history of a klebsiella UTI.  Realized that a urine has not been rechecked-  will order today.  Was on farxiga as OP-  currently on hold, would continue to hold.  No indications for dialysis at this time-   thankfully his BUN and crt are heading in the right direction and his volume status is better 2. S/p renal transplant-  15 years ago-- continue prograf, cellcept and prednisone 3. CHF-  quite severe-  conservative management-  on vericiguat, lipitoro  other heart failure meds difficult due to CKD 4. Afib-  amio and eliquis-  permanent Afib -  rate controlled 5. Volume overload-  thankfully responded to IV lasix-  says weight down now 16 pounds.  Also an element of third spacing with dec albumin-  have not yet transitioned to PO diuretics -  at a good place a liter negative last 24 hours-  now the choice of dose is not clear-  per cardiology I guess-  he will weight himself daily  6. Anemia-  not helping - s/p transfusion 11/8-   iron low in past-  giving iron and ESA (darbe 300 on 11/8) 7. Potassium-  was high upon admission but  corrected pretty quickly-  on lokelma as OP ?  Also aldactone-  not on either here and K is trending down.   added back low dose aldactone  8. Dispo-  seems to be approaching discharge -  will need f/u with cards and his nephrologist ([place])  Pt will not be physically seen over the weekend-  renal is [facility] with discharge, pt says he has an appt with Dr. Joseph Berkshire soon -  will revisit Monday if still here-  call with questions   Louis Meckel    Labs: Basic Metabolic Panel: Recent Labs  Lab 12/16/21 0346 12/17/21 0414 12/18/21 0348  NA 133* 135 136  K 4.3 3.7 3.9  CL 100 99 99  CO2 '25 26 26  '$ GLUCOSE 208* 166* 160*  BUN 73* 68* 63*  CREATININE 3.60* 3.43* 3.31*  CALCIUM 7.5* 7.7* 7.7*   Liver Function Tests: Recent Labs  Lab 12/14/21 1101 12/15/21 0342  AST 16 14*  ALT 15 15  ALKPHOS 74 67  BILITOT 1.5* 0.9  PROT 5.4* 5.1*  ALBUMIN 3.1* 2.9*   No results for input(s): "LIPASE", "AMYLASE" in the last 168 hours. No results for input(s): "AMMONIA"  in the last 168 hours. CBC: Recent Labs  Lab 12/14/21 1101 12/15/21 0342 12/16/21 0346 12/17/21 0414 12/18/21 0348  WBC 4.3 4.1 3.7* 4.0 5.5  HGB 7.4* 7.7* 8.6* 9.6* 10.1*  HCT 25.3* 26.6* 28.0* 31.2* 33.0*  MCV 94.4 93.7 90.9 90.4 90.2  PLT 72* 73* 78* 93* 115*   Cardiac Enzymes: No results for input(s): "CKTOTAL", "CKMB", "CKMBINDEX", "TROPONINI" in the last 168 hours. CBG: Recent Labs  Lab 12/17/21 1121 12/17/21 1629 12/17/21 2127 12/18/21 0314 12/18/21 0716  GLUCAP 181* 297* 266* 165* 139*    Iron Studies: No results for input(s): "IRON", "TIBC", "TRANSFERRIN", "FERRITIN" in the last 72 hours. Studies/Results: No results found. Medications: Infusions:  sodium chloride     ferric gluconate (FERRLECIT) IVPB 250 mg (12/17/21 0818)    Scheduled Medications:  sodium chloride   Intravenous Once   amiodarone  100 mg Oral Daily   apixaban  5 mg Oral BID   atorvastatin  40 mg Oral Daily   ezetimibe  10  mg Oral Daily   furosemide  80 mg Intravenous Q8H   insulin aspart  0-5 Units Subcutaneous QHS   insulin aspart  0-9 Units Subcutaneous TID WC   insulin NPH Human  14 Units Subcutaneous BID AC   mupirocin ointment  1 Application Nasal BID   mycophenolate  500 mg Oral BID   predniSONE  5 mg Oral Q breakfast   sodium chloride flush  3 mL Intravenous Q12H   spironolactone  12.5 mg Oral QHS   tacrolimus  1 mg Oral BID   Vericiguat  2.5 mg Oral Daily    have reviewed scheduled and prn medications.  Physical Exam: General:   NAD Heart: RRR Lungs: mostly clear Abdomen: soft,non tender Extremities: still with slight pitting edema to lower legs but overall much better     12/18/2021,7:43 AM  LOS: 4 days

## 2021-12-18 NOTE — Progress Notes (Signed)
Physical Therapy Treatment Patient Details Name: Daniel Myers MRN: [id number] DOB: Jan 25, 1946 Today's Date: 12/18/2021   History of Present Illness Daniel Myers is a 76 y.o. male with medical history significant for CAD/NSTEMI, CKD stage IIIb with prior renal transplant, chronic systolic CHF with EF 54-65%, persistent atrial fibrillation on Eliquis, history of prior CVA, hypertension, chronic anemia, and obesity, who presented to the ED with worsening generalized weakness over the last 3 to 4 days as well as multiple falls.  He endorses lower extremity weakness and denies any loss of consciousness or head injury.  He is noted to have some bruising and lacerations on his extremities and notes increasing tiredness.  Much of this appears to have started since his NSTEMI approximately 1 month ago.  He has been taking Lasix as well as potassium since then and has had some intermittent gastrointestinal symptoms of epigastric abdominal pain as well as some mild diarrhea.  He does to have some pitting edema to bilateral knees.    PT Comments     Patient presents seated in chair (assisted by nursing staff) and agreeable for therapy.  Patient demonstrates increased endurance/distance for gait training with frequent drifting to the right requiring verbal cueing to avoid bumping into walls, no loss of balance and limited mostly due to fatigue.  Patient tolerated staying up in chair after therapy with his family member present in room.  Patient will benefit from continued skilled physical therapy in hospital and recommended venue below to increase strength, balance, endurance for safe ADLs and gait.    Recommendations for follow up therapy are one component of a multi-disciplinary discharge planning process, led by the attending physician.  Recommendations may be updated based on patient status, additional functional criteria and insurance authorization.  Follow Up Recommendations  Home health PT      Assistance Recommended at Discharge Set up Supervision/Assistance  Patient can return home with the following A little help with walking and/or transfers;A little help with bathing/dressing/bathroom;Assistance with cooking/housework;Help with stairs or ramp for entrance   Equipment Recommendations  None recommended by PT    Recommendations for Other Services       Precautions / Restrictions Precautions Precautions: Fall Restrictions Weight Bearing Restrictions: No     Mobility  Bed Mobility               General bed mobility comments: Patient presents seated in chair (assisted by nursing staff)    Transfers Overall transfer level: Needs assistance Equipment used: Rolling walker (2 wheels) Transfers: Sit to/from Stand, Bed to chair/wheelchair/BSC Sit to Stand: Supervision   Step pivot transfers: Supervision       General transfer comment: slightly labored movement having to lean on bed rail when not using an AD, safer using RW    Ambulation/Gait Ambulation/Gait assistance: Supervision, Min guard Gait Distance (Feet): 85 Feet Assistive device: Rolling walker (2 wheels) Gait Pattern/deviations: Decreased step length - left, Decreased stance time - right, Decreased stride length, Trunk flexed, Drifts right/left Gait velocity: decreased     General Gait Details: increased endurance/distance for gait training with frequent veering to the right without loss of balance, but requires verbal cues to avoid bumping into walls with fair/good carryover   Stairs             Wheelchair Mobility    Modified Rankin (Stroke Patients Only)       Balance Overall balance assessment: Needs assistance Sitting-balance support: Feet supported, No upper extremity supported Sitting balance-Leahy Scale:  Good Sitting balance - Comments: seated EOB   Standing balance support: During functional activity, No upper extremity supported Standing balance-Leahy Scale:  Poor Standing balance comment: fair/poor without AD, fair/good using RW                            Cognition Arousal/Alertness: Awake/alert Behavior During Therapy: WFL for tasks assessed/performed Overall Cognitive Status: Within Functional Limits for tasks assessed                                          Exercises General Exercises - Lower Extremity Long Arc Quad: Seated, AROM, Strengthening, Both, 10 reps Hip Flexion/Marching: Seated, Strengthening, AROM, Both, 10 reps Toe Raises: Seated, AROM, Strengthening, Both, 10 reps Heel Raises: Seated, AROM, Strengthening, Both, 10 reps    General Comments        Pertinent Vitals/Pain Pain Assessment Pain Assessment: No/denies pain    Home Living                          Prior Function            PT Goals (current goals can now be found in the care plan section) Acute Rehab PT Goals Patient Stated Goal: return home with family to assist PT Goal Formulation: With patient Time For Goal Achievement: 12/22/21 Potential to Achieve Goals: Good Progress towards PT goals: Progressing toward goals    Frequency    Min 3X/week      PT Plan Current plan remains appropriate    Co-evaluation              AM-PAC PT "6 Clicks" Mobility   Outcome Measure  Help needed turning from your back to your side while in a flat bed without using bedrails?: None Help needed moving from lying on your back to sitting on the side of a flat bed without using bedrails?: None Help needed moving to and from a bed to a chair (including a wheelchair)?: A Little Help needed standing up from a chair using your arms (e.g., wheelchair or bedside chair)?: A Little Help needed to walk in hospital room?: A Little Help needed climbing 3-5 steps with a railing? : A Little 6 Click Score: 20    End of Session   Activity Tolerance: Patient tolerated treatment well;Patient limited by fatigue Patient left: in  chair;with call bell/phone within reach;with family/visitor present Nurse Communication: Mobility status PT Visit Diagnosis: Unsteadiness on feet (R26.81);Other abnormalities of gait and mobility (R26.89);Muscle weakness (generalized) (M62.81)     Time: 2778-2423 PT Time Calculation (min) (ACUTE ONLY): 22 min  Charges:  $Gait Training: 8-22 mins $Therapeutic Exercise: 8-22 mins                     1:49 PM, 12/18/21 Lonell Grandchild, MPT Physical Therapist with [facility] [phone] office [phone] mobile phone

## 2021-12-18 NOTE — Progress Notes (Addendum)
Subjective:  Patient is feeling well and is inquiring about going home. No acute events overnight.  Pt complaining of burning with urination.   Objective:  Vital signs in last 24 hours: Vitals:   12/17/21 1100 12/17/21 2024 12/18/21 0500 12/18/21 0556  BP: (!) 124/54 (!) 124/57  (!) 111/58  Pulse: 72 66  78  Resp: (!) '22 20  18  '$ Temp: 99 F (37.2 C) 98.2 F (36.8 C)  98.1 F (36.7 C)  TempSrc: Oral Oral  Oral  SpO2: 93% 96%  93%  Weight:   91.1 kg    Weight change: -4.8 kg  Intake/Output Summary (Last 24 hours) at 12/18/2021 0957 Last data filed at 12/18/2021 0800 Gross per 24 hour  Intake 1340.45 ml  Output 2800 ml  Net -1459.55 ml   Physical Examination   General: sitting up in chair, in no acute distress Head: normocephalic, atraumatic Cardiovascular: regular rate and rhythm without murmurs, rubs, or gallops, 1+ pitting edema (continuing to improve) Respiratory: normal respiratory effort, lungs CTAB Abdominal: normoactive bowel sounds, no tenderness to palpation Skin: warm, dry Central Nervous System: alert and oriented x 3 Psychiatric: Normal mood and affect  Assessment/Plan:  Principal Problem:   Weakness Active Problems:   Essential hypertension   Insulin dependent type 2 diabetes mellitus (HCC)   Hyperlipidemia   CKD (chronic kidney disease) stage 4, GFR 15-29 ml/min (HCC)   Thrombocytopenia (HCC)   Cerebrovascular accident (CVA) ([place])   Diabetic nephropathy associated with type 2 diabetes mellitus ([place])   Immunosuppression ([place])   Long-term use of immunosuppressant medication   NSTEMI (non-ST elevated myocardial infarction) ([place])   DNR (do not resuscitate)   CAD (coronary artery disease)/Prior MI   Obesity (BMI 30-39.9)   Acute on chronic congestive heart failure (HCC)   Paroxysmal A-fib ([place])   Acute combined HF Patient has responded well to IV Lasix. Per Cardiology, patient will continue on IV Lasix for another day or so. - continue to  monitor I/O closely - following electrolytes    Paroxysmal Afib - he is on apixaban for full anticoagulation  - cardiology holding metoprolol, remains on amio 100 mg daily    Hyperlipidemia - resume home atorvastation, ezetimibe   CAD - pt reportedly had refused CABG and ICD - continue medical mgmt   Hyperkalemia - treated and resolved   Type 2 DM, with hyperglycemia  CBG 139. - Continue NPH 14 units BID with meals and SSI   Anemia of CKD Hgb is 10.1. Patient receiving Ferrlecit and ESA -Monitor CBC   Thrombocypenia - may need to hold apixaban if platelets drop below 50k (currently 115)   Obesity  - BMP 31-32 - lifestyle modifications recommended  UTI - Pt reports dysuria with abnormal UA - IV ceftriaxone ordered - pyridium x 1 dose   LOS: 4 days   Stanford Breed, Medical Student 12/18/2021, 9:57 AM    Patient seen and examined with T. Dorene Ar, Careers information officer. In addition to supervising the encounter, I played a key role in the decision making process as well as reviewed key findings.    Attending Note:  I agree with documentation as noted.  Cardio wants to continue IV lasix. Pt refused his last dose of IV lasix today.  Try again tomorrow.  If continues to refuse will DC home on oral lasix and have him follow up outpatient with nephrology and cardiology.   UTI started on ceftriaxone.    Gerlene Fee, MD How to contact the  TRH Attending or Consulting provider [place] or covering provider during after hours [place], for this patient?  Check the care team in [facility] and look for a) attending/consulting TRH provider listed and b) the [facility] team listed Log into [email or web address] and use [place]'s universal password to access. If you do not have the password, please contact the hospital operator. Locate the [facility] provider you are looking for under [facility] and page to a number that you can be directly reached. If you still have difficulty reaching the provider, please  page the [facility] (Director on Call) for the Hospitalists listed on amion for assistance.

## 2021-12-18 NOTE — Progress Notes (Signed)
Progress Note  Patient Name: Daniel Myers Date of Encounter: 12/18/2021  Primary Cardiologist: Rozann Lesches, MD  Subjective   Patient stated significant improvement in his symptoms compared to yesterday.  He put out 2.5 L of urine in the last 24 hours with a net negative of 1 L.  Inpatient Medications    Scheduled Meds:  sodium chloride   Intravenous Once   amiodarone  100 mg Oral Daily   apixaban  5 mg Oral BID   atorvastatin  40 mg Oral Daily   ezetimibe  10 mg Oral Daily   furosemide  80 mg Intravenous Q8H   insulin aspart  0-5 Units Subcutaneous QHS   insulin aspart  0-9 Units Subcutaneous TID WC   insulin NPH Human  14 Units Subcutaneous BID AC   mupirocin ointment  1 Application Nasal BID   mycophenolate  500 mg Oral BID   predniSONE  5 mg Oral Q breakfast   sodium chloride flush  3 mL Intravenous Q12H   spironolactone  12.5 mg Oral QHS   tacrolimus  1 mg Oral BID   Vericiguat  2.5 mg Oral Daily   Continuous Infusions:  sodium chloride     ferric gluconate (FERRLECIT) IVPB 250 mg (12/18/21 0923)   PRN Meds: sodium chloride, acetaminophen **OR** acetaminophen, albuterol, ALPRAZolam, fluticasone, loratadine, ondansetron **OR** ondansetron (ZOFRAN) IV, sodium chloride flush   Vital Signs    Vitals:   12/17/21 1100 12/17/21 2024 12/18/21 0500 12/18/21 0556  BP: (!) 124/54 (!) 124/57  (!) 111/58  Pulse: 72 66  78  Resp: (!) '22 20  18  '$ Temp: 99 F (37.2 C) 98.2 F (36.8 C)  98.1 F (36.7 C)  TempSrc: Oral Oral  Oral  SpO2: 93% 96%  93%  Weight:   91.1 kg     Intake/Output Summary (Last 24 hours) at 12/18/2021 0929 Last data filed at 12/18/2021 0800 Gross per 24 hour  Intake 1340.45 ml  Output 2800 ml  Net -1459.55 ml   Filed Weights   12/16/21 0526 12/17/21 0454 12/18/21 0500  Weight: 98.2 kg 95.9 kg 91.1 kg    Telemetry     Personally reviewed, HR controlled at 50-60s.  ECG    An ECG dated 12/14/2021 was personally reviewed today and  demonstrated:  Sinus bradycardia  Physical Exam   GEN: No acute distress.   Neck: JVD+ Cardiac: Bibasilar rales + improved from yesterday, S1 S2 heard Respiratory: Nonlabored..Bibasilar rales + GI: Abdominal distention present MS: 1-2+ pitting edema in LE; No deformity. Neuro:  Nonfocal. Psych: Alert and oriented x 3. Normal affect.  Labs    Chemistry Recent Labs  Lab 12/14/21 1101 12/15/21 0342 12/16/21 0346 12/17/21 0414 12/18/21 0348  NA 134* 134* 133* 135 136  K 5.9* 4.9 4.3 3.7 3.9  CL 103 103 100 99 99  CO2 '23 23 25 26 26  '$ GLUCOSE 321* 148* 208* 166* 160*  BUN 73* 72* 73* 68* 63*  CREATININE 3.77* 3.74* 3.60* 3.43* 3.31*  CALCIUM 7.9* 7.7* 7.5* 7.7* 7.7*  PROT 5.4* 5.1*  --   --   --   ALBUMIN 3.1* 2.9*  --   --   --   AST 16 14*  --   --   --   ALT 15 15  --   --   --   ALKPHOS 74 67  --   --   --   BILITOT 1.5* 0.9  --   --   --  GFRNONAA 16* 16* 17* 18* 19*  ANIONGAP '8 8 8 10 11     '$ Hematology Recent Labs  Lab 12/16/21 0346 12/17/21 0414 12/18/21 0348  WBC 3.7* 4.0 5.5  RBC 3.08* 3.45* 3.66*  HGB 8.6* 9.6* 10.1*  HCT 28.0* 31.2* 33.0*  MCV 90.9 90.4 90.2  MCH 27.9 27.8 27.6  MCHC 30.7 30.8 30.6  RDW 17.6* 17.5* 17.6*  PLT 78* 93* 115*    Cardiac Enzymes Recent Labs  Lab 12/14/21 1101 12/14/21 1357  TROPONINIHS 127* 130*    BNP Recent Labs  Lab 12/14/21 1101  BNP >4,500.0*     DDimerNo results for input(s): "DDIMER" in the last 168 hours.   Cardiac Studies  Echo October 2023 LVEF 20 to 60% Grade 3 diastolic dysfunction (I suspect this is a reversible restrictive)  LHC in 2022 Multivessel CAD, refused CABG and high risk candidate for PCI Refused ICD   Assessment & Plan   Patient is a 76 year old M known to have multivessel CAD in 2022 with LVEF 25 to 30% (refused CABG and ICD but will be high risk of LAD CTO intervention) in 10/23, paroxysmal A-fib status post DCCV in 10/2020, history of CVA, HTN, kidney transplant 15  years ago c/w stage III CKD who is currently followed by cardiology for the management of ADHF.  #Acute systolic and diastolic heart failure exacerbation Plan -Continue IV Lasix 80 mg every 8 hours. I doubt if 210 pounds is his real dry weight due to continued improvement in his serum creatinine, symptoms and physical examination findings. I will switch his IV Lasix from 80 mg to 60 mg Q12h in the next 1 to 2 days with further improvement in his serum creatinine and chloride. Obtain CMP every 12 hours.  Keep K>4 and <5, Mg>2 and <3. -Hold metoprolol succinate and continue amiodarone 100 mg once daily. -Patient did not tolerate ACE inhibitor due to hyperkalemia, did not tolerate Entresto due to hypotension, did not tolerate MRA either. Hold SGLT2 inhibitors. -Daily weights, ins and outs, 1.2 L fluid restriction -Patient refused ICD and hence not a candidate for LifeVest  #Paroxysmal A-fib, currently in NSR Plan -Hold metoprolol succinate and continue amiodarone 100 mg once daily.  Can switch to metoprolol succinate upon discharge and hold discontinue amiodarone upon discharge. -Continue Eliquis 5 mg twice daily.   #Multivessel CAD (refused CABG and ICD) Plan -Keep Hb more than 8 -Patient not on aspirin due to Eliquis. Continue intensity statin, atorvastatin 40 mg nightly and Zetia 10 mg once daily.  #HLD Plan -Continue high intensity statin, atorvastatin 40 mg nightly and Zetia 10 mg once daily.  I have spent a total of 38 minutes with patient reviewing chart , telemetry, EKGs, labs and examining patient as well as establishing an assessment and plan that was discussed with the patient.  > 50% of time was spent in direct patient care.       Signed, Chalmers Guest, MD  12/18/2021, 9:29 AM

## 2021-12-19 DIAGNOSIS — I5043 Acute on chronic combined systolic (congestive) and diastolic (congestive) heart failure: Secondary | ICD-10-CM | POA: Diagnosis not present

## 2021-12-19 DIAGNOSIS — Z66 Do not resuscitate: Secondary | ICD-10-CM | POA: Diagnosis not present

## 2021-12-19 DIAGNOSIS — R531 Weakness: Secondary | ICD-10-CM | POA: Diagnosis not present

## 2021-12-19 DIAGNOSIS — I480 Paroxysmal atrial fibrillation: Secondary | ICD-10-CM | POA: Diagnosis not present

## 2021-12-19 LAB — GLUCOSE, CAPILLARY
Glucose-Capillary: 255 mg/dL — ABNORMAL HIGH (ref 70–99)
Glucose-Capillary: 142 mg/dL — ABNORMAL HIGH (ref 70–99)
Glucose-Capillary: 129 mg/dL — ABNORMAL HIGH (ref 70–99)
Glucose-Capillary: 279 mg/dL — ABNORMAL HIGH (ref 70–99)
Glucose-Capillary: 221 mg/dL — ABNORMAL HIGH (ref 70–99)
Glucose-Capillary: 266 mg/dL — ABNORMAL HIGH (ref 70–99)

## 2021-12-19 LAB — BASIC METABOLIC PANEL
Anion gap: 10 (ref 5–15)
BUN: 57 mg/dL — ABNORMAL HIGH (ref 8–23)
CO2: 26 mmol/L (ref 22–32)
Calcium: 7.6 mg/dL — ABNORMAL LOW (ref 8.9–10.3)
Chloride: 97 mmol/L — ABNORMAL LOW (ref 98–111)
Creatinine, Ser: 3.04 mg/dL — ABNORMAL HIGH (ref 0.61–1.24)
GFR, Estimated: 21 mL/min — ABNORMAL LOW (ref >60)
Glucose, Bld: 134 mg/dL — ABNORMAL HIGH (ref 70–99)
Potassium: 3.7 mmol/L (ref 3.5–5.1)
Sodium: 133 mmol/L — ABNORMAL LOW (ref 135–145)

## 2021-12-19 NOTE — Progress Notes (Signed)
PROGRESS NOTE   Daniel Myers  ZOX:[id number] DOB: 1945-07-28 DOA: 12/14/2021 PCP: Claretta Fraise, MD   Chief Complaint  Patient presents with   Weakness   Level of care: Med-Surg  Brief Admission History:  76 y.o. 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  He endorses lower extremity weakness and denies any loss of consciousness or head injury.  He is noted to have some bruising and lacerations on his extremities and notes increasing tiredness.  Much of this appears to have started since his NSTEMI approximately 1 month ago.  He has been taking Lasix as well as potassium since then and has had some intermittent gastrointestinal symptoms of epigastric abdominal pain as well as some mild diarrhea.  He does to have some pitting edema to bilateral knees.   ED Course: Vital signs stable and patient is afebrile.  He is noted to have hemoglobin 7.4 and platelet count of 72,000.  Potassium 5.9 in the setting of creatinine 3.77 with baseline near 2 from several weeks to months ago.  Troponin 127 and glucose 321.  He is noted to have some left-sided rib fractures likely related to the fall.  He has been given Lokelma and calcium gluconate per nephrology recommendations in the ED.     Assessment and Plan:  Acute combined HF - he remains on high dose IV furosemide with good results - plan is for him to remain on IV furosemide today - continue to monitor I/O closely - following electrolytes  - reduced IV lasix to 60 mg BID - plan to transition to oral diuretic on 11/12  Paroxysmal Afib - he is on apixaban for full anticoagulation  - cardiology holding metoprolol, remains on amio 100 mg daily   - cardiology reports plan to resume metoprolol at DC and stop amiodarone  Hyperlipidemia - resume home atorvastation, ezetimibe  CAD - pt reportedly had refused CABG and ICD - continue medical mgmt  Hyperkalemia - treated and resolved  Type 2 DM, with hyperglycemia  - increased NPH to 14 units BID with meals, added 3 am BS testing CBG (last 3)  Recent Labs    12/19/21 0309 12/19/21 0717 12/19/21 1115  GLUCAP 142* 129* 279*   Stage IV CKD  - appreciate nephrology assistance with diuresis - pt has outpatient follow up with primary nephrologist - creatinine holding stable on IV diuresis  Anemia of CKD - Hg up to 10  - s/p IV iron infusion ordered by nephrologist  Thrombocypenia - may need to hold apixaban if platelets drop below 50k  Obesity  - BMP 31-32 - lifestyle modifications recommended  UTI with dysuria - follow up urine culture - continue IV ceftriaxone x 3 doses  - s/p 1 dose of pyridium    DVT prophylaxis: apixaban  Code Status: full  Family Communication: bedside update 11/8, 11/10 Disposition: Status is: Inpatient Remains inpatient appropriate because: requires IV furosemide    Consultants:  Cardiology Nephrology  Procedures:   Antimicrobials:    Subjective: Overall he is having less dysuria since starting IV ceftriaxone.    Objective: Vitals:   12/18/21 1956 12/18/21 2037 12/19/21 0317 12/19/21 0500  BP: (!) 117/52 (!) 141/59 (!) 114/5   Pulse: 76 76  70   Resp: '17 20 20   '$ Temp: 98.2 F (36.8 C) 98.3 F (36.8 C) 97.9 F (36.6 C)   TempSrc: Oral Oral Oral   SpO2: 98% 100% 95%   Weight:    90.5 kg    Intake/Output Summary (Last 24 hours) at 12/19/2021 1240 Last data filed at 12/19/2021 0900 Gross per 24 hour  Intake 1110 ml  Output 1500 ml  Net -390 ml   Filed Weights   12/17/21 0454 12/18/21 0500 12/19/21 0500  Weight: 95.9 kg 91.1 kg 90.5 kg   Examination:  General exam: Appears calm and comfortable  Respiratory  system: Clear to auscultation. Respiratory effort normal. Cardiovascular system: normal S1 & S2 heard. No JVD, murmurs, rubs, gallops or clicks. No pedal edema. Gastrointestinal system: Abdomen is nondistended, soft and nontender. No organomegaly or masses felt. Normal bowel sounds heard. Central nervous system: Alert and oriented. No focal neurological deficits. Extremities: 1+ edema BLEs, Symmetric 5 x 5 power. Skin: No rashes, lesions or ulcers. Psychiatry: Judgement and insight appear normal. Mood & affect appropriate.   Data Reviewed: I have personally reviewed following labs and imaging studies  CBC: Recent Labs  Lab 12/14/21 1101 12/15/21 0342 12/16/21 0346 12/17/21 0414 12/18/21 0348  WBC 4.3 4.1 3.7* 4.0 5.5  HGB 7.4* 7.7* 8.6* 9.6* 10.1*  HCT 25.3* 26.6* 28.0* 31.2* 33.0*  MCV 94.4 93.7 90.9 90.4 90.2  PLT 72* 73* 78* 93* 115*    Basic Metabolic Panel: Recent Labs  Lab 12/15/21 0342 12/16/21 0346 12/17/21 0414 12/18/21 0348 12/19/21 0554  NA 134* 133* 135 136 133*  K 4.9 4.3 3.7 3.9 3.7  CL 103 100 99 99 97*  CO2 '23 25 26 26 26  '$ GLUCOSE 148* 208* 166* 160* 134*  BUN 72* 73* 68* 63* 57*  CREATININE 3.74* 3.60* 3.43* 3.31* 3.04*  CALCIUM 7.7* 7.5* 7.7* 7.7* 7.6*  MG 2.9* 2.7* 2.5* 2.4  --     CBG: Recent Labs  Lab 12/18/21 1956 12/18/21 2040 12/19/21 0309 12/19/21 0717 12/19/21 1115  GLUCAP 255* 263* 142* 129* 279*    Recent Results (from the past 240 hour(s))  MRSA Next Gen by PCR, Nasal     Status: Abnormal   Collection Time: 12/14/21  5:34 PM   Specimen: Nasal Mucosa; Nasal Swab  Result Value Ref Range Status   MRSA by PCR Next Gen DETECTED (A) NOT DETECTED Final    Comment: RESULT CALLED TO, READ BACK BY AND VERIFIED WITH: JESSICA ELLER @ 1941 ON 12/14/21 C VARNER (NOTE) The GeneXpert MRSA Assay (FDA approved for NASAL specimens only), is one component of a comprehensive MRSA colonization surveillance program. It is not intended to diagnose  MRSA infection nor to guide or monitor treatment for MRSA infections. Test performance is not FDA approved in patients less than 12 years old. Performed at [facility], [address], [place], [place] [address]      Radiology Studies: No results found.  Scheduled Meds:  sodium chloride   Intravenous Once   amiodarone  100 mg Oral Daily   apixaban  5 mg Oral BID   atorvastatin  40 mg Oral Daily   ezetimibe  10 mg Oral Daily   furosemide  60 mg Intravenous Q12H   insulin aspart  0-5 Units Subcutaneous QHS   insulin aspart  0-9 Units Subcutaneous TID WC   insulin NPH Human  14 Units Subcutaneous BID AC   mycophenolate  500 mg Oral BID   predniSONE  5 mg Oral Q breakfast   sodium chloride flush  3 mL Intravenous Q12H   spironolactone  12.5 mg Oral QHS   tacrolimus  1 mg Oral BID   Vericiguat  2.5 mg Oral Daily   Continuous Infusions:  sodium chloride     cefTRIAXone (ROCEPHIN)  IV 2 g (12/18/21 1530)   ferric gluconate (FERRLECIT) IVPB 250 mg (12/19/21 1153)    LOS: 5 days   Time spent: 35 mins  Esiah Bazinet Wynetta Emery, MD How to contact the [facility] Attending or Consulting provider [place] or covering provider during after hours [place], for this patient?  Check the care team in [facility] and look for a) attending/consulting TRH provider listed and b) the [facility] team listed Log into [email or web address] and use [place]'s universal password to access. If you do not have the password, please contact the hospital operator. Locate the [facility] provider you are looking for under [facility] and page to a number that you can be directly reached. If you still have difficulty reaching the provider, please page the [facility] (Director on Call) for the Hospitalists listed on amion for assistance.  12/19/2021, 12:40 PM

## 2021-12-20 DIAGNOSIS — R531 Weakness: Secondary | ICD-10-CM | POA: Diagnosis not present

## 2021-12-20 DIAGNOSIS — I5043 Acute on chronic combined systolic (congestive) and diastolic (congestive) heart failure: Secondary | ICD-10-CM | POA: Diagnosis not present

## 2021-12-20 DIAGNOSIS — I480 Paroxysmal atrial fibrillation: Secondary | ICD-10-CM | POA: Diagnosis not present

## 2021-12-20 DIAGNOSIS — Z66 Do not resuscitate: Secondary | ICD-10-CM | POA: Diagnosis not present

## 2021-12-20 LAB — BASIC METABOLIC PANEL
Anion gap: 8 (ref 5–15)
BUN: 53 mg/dL — ABNORMAL HIGH (ref 8–23)
CO2: 25 mmol/L (ref 22–32)
Calcium: 7.7 mg/dL — ABNORMAL LOW (ref 8.9–10.3)
Chloride: 101 mmol/L (ref 98–111)
Creatinine, Ser: 2.69 mg/dL — ABNORMAL HIGH (ref 0.61–1.24)
GFR, Estimated: 24 mL/min — ABNORMAL LOW (ref >60)
Glucose, Bld: 162 mg/dL — ABNORMAL HIGH (ref 70–99)
Potassium: 3.8 mmol/L (ref 3.5–5.1)
Sodium: 134 mmol/L — ABNORMAL LOW (ref 135–145)

## 2021-12-20 LAB — GLUCOSE, CAPILLARY
Glucose-Capillary: 184 mg/dL — ABNORMAL HIGH (ref 70–99)
Glucose-Capillary: 168 mg/dL — ABNORMAL HIGH (ref 70–99)
Glucose-Capillary: 239 mg/dL — ABNORMAL HIGH (ref 70–99)
Glucose-Capillary: 286 mg/dL — ABNORMAL HIGH (ref 70–99)
Glucose-Capillary: 332 mg/dL — ABNORMAL HIGH (ref 70–99)

## 2021-12-20 MED ORDER — INSULIN NPH (HUMAN) (ISOPHANE) 100 UNIT/ML ~~LOC~~ SUSP
15.0000 [IU] | Freq: Two times a day (BID) | SUBCUTANEOUS | Status: DC
  Administered 2021-12-20 – 2021-12-21 (×2): 15 [IU] via SUBCUTANEOUS
  Filled 2021-12-20: qty 10

## 2021-12-20 NOTE — Plan of Care (Signed)
  Problem: Cardiac: Goal: Ability to achieve and maintain adequate cardiovascular perfusion will improve Outcome: Not Progressing   Problem: Pain Managment: Goal: General experience of comfort will improve Outcome: Not Progressing

## 2021-12-20 NOTE — Progress Notes (Signed)
PROGRESS NOTE   Daniel Myers  MCN:[id number] DOB: [birth year] DOA: 12/14/2021 PCP: Claretta Fraise, MD   Chief Complaint  Patient presents with   Weakness   Level of care: Med-Surg  Brief Admission History:  76 y.o. 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  He endorses lower extremity weakness and denies any loss of consciousness or head injury.  He is noted to have some bruising and lacerations on his extremities and notes increasing tiredness.  Much of this appears to have started since his NSTEMI approximately 1 month ago.  He has been taking Lasix as well as potassium since then and has had some intermittent gastrointestinal symptoms of epigastric abdominal pain as well as some mild diarrhea.  He does to have some pitting edema to bilateral knees.   ED Course: Vital signs stable and patient is afebrile.  He is noted to have hemoglobin 7.4 and platelet count of 72,000.  Potassium 5.9 in the setting of creatinine 3.77 with baseline near 2 from several weeks to months ago.  Troponin 127 and glucose 321.  He is noted to have some left-sided rib fractures likely related to the fall.  He has been given Lokelma and calcium gluconate per nephrology recommendations in the ED.    Assessment and Plan:  Acute combined HF - he remains on high dose IV furosemide with good results - plan is for him to remain on IV furosemide for now - continue to monitor I/O closely - following electrolytes  - reduced IV lasix to 60 mg BID - plan to transition to oral diuretic in 1-2 days  Paroxysmal Afib - he is on apixaban for full anticoagulation  - cardiology holding metoprolol, remains on amio 100 mg  daily  - cardiology reports plan to resume metoprolol at DC and stop amiodarone  Hyperlipidemia - resume home atorvastation, ezetimibe  Cellulitis of right foot - improving on IV high dose ceftriaxone - continue for at least 2 more doses   CAD - pt reportedly had refused CABG and ICD - continue medical mgmt  Hyperkalemia - treated and resolved  Type 2 DM, with hyperglycemia  - increased NPH to 14 units BID with meals, added 3 am BS testing CBG (last 3)  Recent Labs    12/20/21 0324 12/20/21 0719 12/20/21 1106  GLUCAP 184* 168* 239*   Stage IV CKD  - appreciate nephrology assistance with diuresis - pt has outpatient follow up with primary nephrologist - creatinine holding stable on IV diuresis  Anemia of CKD - Hg up to 10  - s/p IV iron infusion ordered by nephrologist  Thrombocypenia - may need to hold apixaban if platelets drop below 50k  Obesity  - BMP 31-32 - lifestyle modifications recommended  UTI with dysuria - follow up urine culture - continue IV ceftriaxone as ordered - s/p 1 dose of pyridium  - dysuria symptoms improved   DVT prophylaxis: apixaban  Code Status: full  Family Communication: bedside update 11/8, 11/10, 11/11 Disposition: Status is: Inpatient Remains inpatient appropriate because: requires IV furosemide    Consultants:  Cardiology Nephrology  Procedures:   Antimicrobials:    Subjective: He reports his right foot infection slowly improving with treatments, dysuria symptoms seem  better     Objective: Vitals:   12/19/21 1932 12/19/21 2024 12/20/21 0327 12/20/21 0446  BP: (!) 108/45 (!) 120/47 (!) 127/56   Pulse: 68 70 69   Resp: '18 18 18   '$ Temp: 98 F (36.7 C) 98 F (36.7 C) 98.1 F (36.7 C)   TempSrc:  Oral Oral   SpO2: 98% 98% 96%   Weight:    91.1 kg    Intake/Output Summary (Last 24 hours) at 12/20/2021 1218 Last data filed at 12/20/2021 0654 Gross per 24 hour  Intake 480 ml  Output 1550 ml  Net -1070 ml    Filed Weights   12/18/21 0500 12/19/21 0500 12/20/21 0446  Weight: 91.1 kg 90.5 kg 91.1 kg   Examination:  General exam: Appears calm and comfortable  Respiratory system: Clear to auscultation. Respiratory effort normal. Cardiovascular system: normal S1 & S2 heard. No JVD, murmurs, rubs, gallops or clicks. No pedal edema. Gastrointestinal system: Abdomen is nondistended, soft and nontender. No organomegaly or masses felt. Normal bowel sounds heard. Central nervous system: Alert and oriented. No focal neurological deficits. Extremities: 1+ edema BLEs, Symmetric 5 x 5 power. Skin: erythema dorsal right foot c/w improving cellulitis, multiple scabbed over sores healing like was source of infection. Psychiatry: Judgement and insight appear normal. Mood & affect appropriate.   Data Reviewed: I have personally reviewed following labs and imaging studies  CBC: Recent Labs  Lab 12/14/21 1101 12/15/21 0342 12/16/21 0346 12/17/21 0414 12/18/21 0348  WBC 4.3 4.1 3.7* 4.0 5.5  HGB 7.4* 7.7* 8.6* 9.6* 10.1*  HCT 25.3* 26.6* 28.0* 31.2* 33.0*  MCV 94.4 93.7 90.9 90.4 90.2  PLT 72* 73* 78* 93* 115*    Basic Metabolic Panel: Recent Labs  Lab 12/15/21 0342 12/16/21 0346 12/17/21 0414 12/18/21 0348 12/19/21 0554 12/20/21 0731  NA 134* 133* 135 136 133* 134*  K 4.9 4.3 3.7 3.9 3.7 3.8  CL 103 100 99 99 97* 101  CO2 '23 25 26 26 26 25  '$ GLUCOSE 148* 208* 166* 160* 134* 162*  BUN 72* 73* 68* 63* 57* 53*  CREATININE 3.74* 3.60* 3.43* 3.31* 3.04* 2.69*  CALCIUM 7.7* 7.5* 7.7* 7.7* 7.6* 7.7*  MG 2.9* 2.7* 2.5* 2.4  --   --     CBG: Recent Labs  Lab 12/19/21 1647 12/19/21 2114 12/20/21 0324 12/20/21 0719 12/20/21 1106  GLUCAP 221* 266* 184* 168* 239*    Recent Results (from the past 240 hour(s))  MRSA Next Gen by PCR, Nasal     Status: Abnormal   Collection Time: 12/14/21  5:34 PM   Specimen: Nasal Mucosa; Nasal Swab  Result Value Ref Range Status   MRSA by PCR Next Gen  DETECTED (A) NOT DETECTED Final    Comment: RESULT CALLED TO, READ BACK BY AND VERIFIED WITH: JESSICA ELLER @ 1941 ON 12/14/21 C VARNER (NOTE) The GeneXpert MRSA Assay (FDA approved for NASAL specimens only), is one component of a comprehensive MRSA colonization surveillance program. It is not intended to diagnose MRSA infection nor to guide or monitor treatment for MRSA infections. Test performance is not FDA approved in patients less than 57 years old. Performed at [facility], [address] [place], [place] [address]      Radiology Studies: No results found.  Scheduled Meds:  sodium chloride   Intravenous Once   amiodarone  100 mg Oral Daily   apixaban  5 mg Oral BID   atorvastatin  40 mg Oral Daily   ezetimibe  10 mg Oral Daily   furosemide  60 mg Intravenous Q12H   insulin aspart  0-5 Units Subcutaneous QHS   insulin aspart  0-9 Units Subcutaneous TID WC   insulin NPH Human  14 Units Subcutaneous BID AC   mycophenolate  500 mg Oral BID   predniSONE  5 mg Oral Q breakfast   sodium chloride flush  3 mL Intravenous Q12H   spironolactone  12.5 mg Oral QHS   tacrolimus  1 mg Oral BID   Vericiguat  2.5 mg Oral Daily   Continuous Infusions:  sodium chloride     cefTRIAXone (ROCEPHIN)  IV 2 g (12/19/21 1511)    LOS: 6 days   Time spent: 35 mins  Daniel Myers Wynetta Emery, MD How to contact the [facility] Attending or Consulting provider [place] or covering provider during after hours [place], for this patient?  Check the care team in [facility] and look for a) attending/consulting TRH provider listed and b) the [facility] team listed Log into [email or web address] and use [place]'s universal password to access. If you do not have the password, please contact the hospital operator. Locate the [facility] provider you are looking for under [facility] and page to a number that you can be directly reached. If you still have difficulty reaching the provider, please page the [facility] (Director on Call) for the  Hospitalists listed on amion for assistance.  12/20/2021, 12:18 PM

## 2021-12-21 ENCOUNTER — Other Ambulatory Visit: Payer: Self-pay | Admitting: Family Medicine

## 2021-12-21 ENCOUNTER — Ambulatory Visit: Payer: Self-pay | Admitting: *Deleted

## 2021-12-21 DIAGNOSIS — I5023 Acute on chronic systolic (congestive) heart failure: Secondary | ICD-10-CM

## 2021-12-21 DIAGNOSIS — I480 Paroxysmal atrial fibrillation: Secondary | ICD-10-CM | POA: Diagnosis not present

## 2021-12-21 DIAGNOSIS — Z66 Do not resuscitate: Secondary | ICD-10-CM | POA: Diagnosis not present

## 2021-12-21 DIAGNOSIS — I5043 Acute on chronic combined systolic (congestive) and diastolic (congestive) heart failure: Secondary | ICD-10-CM | POA: Diagnosis not present

## 2021-12-21 DIAGNOSIS — R531 Weakness: Secondary | ICD-10-CM | POA: Diagnosis not present

## 2021-12-21 DIAGNOSIS — G4700 Insomnia, unspecified: Secondary | ICD-10-CM

## 2021-12-21 LAB — MAGNESIUM: Magnesium: 2.4 mg/dL (ref 1.7–2.4)

## 2021-12-21 LAB — BASIC METABOLIC PANEL
Anion gap: 9 (ref 5–15)
BUN: 49 mg/dL — ABNORMAL HIGH (ref 8–23)
CO2: 24 mmol/L (ref 22–32)
Calcium: 7.6 mg/dL — ABNORMAL LOW (ref 8.9–10.3)
Chloride: 101 mmol/L (ref 98–111)
Creatinine, Ser: 2.6 mg/dL — ABNORMAL HIGH (ref 0.61–1.24)
GFR, Estimated: 25 mL/min — ABNORMAL LOW (ref >60)
Glucose, Bld: 182 mg/dL — ABNORMAL HIGH (ref 70–99)
Potassium: 3.9 mmol/L (ref 3.5–5.1)
Sodium: 134 mmol/L — ABNORMAL LOW (ref 135–145)

## 2021-12-21 LAB — GLUCOSE, CAPILLARY: Glucose-Capillary: 153 mg/dL — ABNORMAL HIGH (ref 70–99)

## 2021-12-21 LAB — URINE CULTURE: Culture: <10000 — AB

## 2021-12-21 MED ORDER — CEFDINIR 300 MG PO CAPS: 300.0000 mg | ORAL_CAPSULE | Freq: Every day | ORAL | 0 refills | Status: AC

## 2021-12-21 MED ORDER — FUROSEMIDE 40 MG PO TABS: 40.0000 mg | ORAL_TABLET | Freq: Two times a day (BID) | ORAL | 1 refills | Status: DC

## 2021-12-21 MED ORDER — LOKELMA 10 G PO PACK: 10.0000 g | PACK | ORAL | Status: DC

## 2021-12-21 MED ORDER — METOPROLOL SUCCINATE ER 25 MG PO TB24: 25.0000 mg | ORAL_TABLET | Freq: Every day | ORAL | 2 refills | Status: AC

## 2021-12-21 MED ORDER — METOPROLOL SUCCINATE ER 25 MG PO TB24
25.0000 mg | ORAL_TABLET | Freq: Every day | ORAL | Status: DC
  Administered 2021-12-21: 25 mg via ORAL
  Filled 2021-12-21: qty 1

## 2021-12-21 MED ORDER — FUROSEMIDE 40 MG PO TABS: 40.0000 mg | ORAL_TABLET | Freq: Two times a day (BID) | ORAL | Status: DC

## 2021-12-21 MED ORDER — FUROSEMIDE 40 MG PO TABS: 60.0000 mg | ORAL_TABLET | Freq: Two times a day (BID) | ORAL | Status: DC

## 2021-12-21 MED ORDER — FLUTICASONE PROPIONATE 50 MCG/ACT NA SUSP: 1.0000 | Freq: Every day | NASAL | Status: AC | PRN

## 2021-12-21 MED ORDER — SPIRONOLACTONE 25 MG PO TABS: 12.5000 mg | ORAL_TABLET | Freq: Every day | ORAL | 2 refills | Status: DC

## 2021-12-21 NOTE — TOC Transition Note (Signed)
Transition of Care [facility]) - CM/SW Discharge Note   Patient Details  Name: ADIAN JABLONOWSKI MRN: [id number] Date of Birth: 11/12/1945  Transition of Care [facility]) CM/SW Contact:  Boneta Lucks, RN Phone Number: 12/21/2021, 11:33 AM   Clinical Narrative:   Patient discharging home today. Active with Alvis Lemmings, MD placed need orders. Georgina Snell updated.   Final next level of care: [place] Barriers to Discharge: Barriers Resolved   Patient Goals and CMS Choice Patient states their goals for this hospitalization and ongoing recovery are:: return home with [facility] CMS Medicare.gov Compare Post Acute Care list provided to:: Patient Choice offered to / list presented to : Patient  Discharge Placement                    Patient and family notified of of transfer: 12/21/21  Discharge Plan and Services In-house Referral: Clinical Social Work Discharge Planning Services: CM Consult Post Acute Care Choice: Home Health            DME Agency: [place] Agency: [place] Date [place]: 12/21/21 Time [place]: 0100 Representative spoke with at [place]: Georgina Snell   Readmission Risk Interventions    12/15/2021    3:45 PM 11/17/2021    3:51 PM  Readmission Risk Prevention Plan  Transportation Screening Complete Complete  HRI or Home Care Consult Complete Complete  Social Work Consult for [place] Planning/Counseling Complete Complete  Palliative Care Screening Not Applicable Not Applicable  Medication Review Press photographer) Complete Complete

## 2021-12-21 NOTE — Discharge Instructions (Signed)
IMPORTANT INFORMATION: PAY CLOSE ATTENTION  ? ?PHYSICIAN DISCHARGE INSTRUCTIONS ? ?Follow with Primary care provider  Daniel Myers, [place], MD  and other consultants as instructed by your Hospitalist Physician ? ?SEEK MEDICAL CARE OR RETURN TO EMERGENCY ROOM IF SYMPTOMS COME BACK, WORSEN OR NEW PROBLEM DEVELOPS  ? ?Please note: ?You were cared for by a hospitalist during your hospital stay. Every effort will be made to forward records to your primary care provider.  You can request that your primary care provider send for your hospital records if they have not received them.  Once you are discharged, your primary care physician will handle any further medical issues. Please note that NO REFILLS for any discharge medications will be authorized once you are discharged, as it is imperative that you return to your primary care physician (or establish a relationship with a primary care physician if you do not have one) for your post hospital discharge needs so that they can reassess your need for medications and monitor your lab values. ? ?Please get a complete blood count and chemistry panel checked by your Primary MD at your next visit, and again as instructed by your Primary MD. ? ?Get Medicines reviewed and adjusted: ?Please take all your medications with you for your next visit with your Primary MD ? ?Laboratory/radiological data: ?Please request your Primary MD to go over all hospital tests and procedure/radiological results at the follow up, please ask your primary care provider to get all Hospital records sent to his/her office. ? ?In some cases, they will be blood work, cultures and biopsy results pending at the time of your discharge. Please request that your primary care provider follow up on these results. ? ?If you are diabetic, please bring your blood sugar readings with you to your follow up appointment with primary care.   ? ?Please call and make your follow up appointments as soon as possible.   ? ?Also Note  the following: ?If you experience worsening of your admission symptoms, develop shortness of breath, life threatening emergency, suicidal or homicidal thoughts you must seek medical attention immediately by calling 911 or calling your MD immediately  if symptoms less severe. ? ?You must read complete instructions/literature along with all the possible adverse reactions/side effects for all the Medicines you take and that have been prescribed to you. Take any new Medicines after you have completely understood and accpet all the possible adverse reactions/side effects.  ? ?Do not drive when taking Pain medications or sleeping medications (Benzodiazepines) ? ?Do not take more than prescribed Pain, Sleep and Anxiety Medications. It is not advisable to combine anxiety,sleep and pain medications without talking with your primary care practitioner ? ?Special Instructions: If you have smoked or chewed Tobacco  in the last 2 yrs please stop smoking, stop any regular Alcohol  and or any Recreational drug use. ? ?Wear Seat belts while driving.  Do not drive if taking any narcotic, mind altering or controlled substances or recreational drugs or alcohol.  ? ? ? ? ? ?

## 2021-12-21 NOTE — Inpatient Diabetes Management (Signed)
Inpatient Diabetes Program Recommendations  AACE/ADA: New Consensus Statement on Inpatient Glycemic Control   Target Ranges:  Prepandial:   less than 140 mg/dL      Peak postprandial:   less than 180 mg/dL (1-2 hours)      Critically ill patients:  140 - 180 mg/dL    Latest Reference Range & Units 12/20/21 07:19 12/20/21 11:06 12/20/21 16:14 12/20/21 21:19 12/21/21 07:41  Glucose-Capillary 70 - 99 mg/dL 168 (H) 239 (H) 286 (H) 332 (H) 153 (H)   Review of Glycemic Control  Diabetes history: DM2 Outpatient Diabetes medications: NPH 10 units BID, Regular 16 units with breakfast, Regular 13 units with supper, Farxiga 10 mg daily Current orders for Inpatient glycemic control: NPH 15 units BID, Novolog 0-9 units TID with meals, Nvolog 0-5 units QHS; Prednisone 5 mg daily  Inpatient Diabetes Program Recommendations:    Insulin: Post prandial glucose is consistently elevated.  Please consider ordering Novolog 5 units TID with meals for meal coverage if patient eats at least 50% of meals.  Thanks, Barnie Alderman, RN, MSN, [place] Diabetes Coordinator Inpatient Diabetes Program [phone] (Team Pager from 8am to [place])

## 2021-12-21 NOTE — Patient Outreach (Signed)
  Care Coordination   12/21/2021 Name: Daniel Myers MRN: [id number] DOB: 07-Jun-1945   Care Coordination Outreach Attempts:  An unsuccessful telephone outreach was attempted today to offer the patient information about available care coordination services as a benefit of their health plan.   Mr Carbonell noted to be inpatient at the time of the scheduled outreach for CHF  Pending discharge home    Follow Up Plan:  Additional outreach attempts will be made to offer the patient care coordination information and services.   Encounter Outcome:  No Answer  Care Coordination Interventions Activated:  No   Care Coordination Interventions:  No, not indicated    Trevione Wert L. Lavina Hamman, RN, BSN, [place] Coordinator Office number [phone]

## 2021-12-21 NOTE — Discharge Summary (Signed)
Physician Discharge Summary  Daniel Myers AJG:[id number] DOB: [birth year] DOA: 12/14/2021  PCP: Claretta Fraise, MD Cardiology: [facility]in Advanced Heart failure   Admit date: 12/14/2021 Discharge date: 12/21/2021  Admitted From: Home  Disposition: Home with home health   Recommendations for Outpatient Follow-up:  Follow up with PCP in 1 weeks Follow up with cardiology in 2 weeks  Please obtain BMP/CBC in 1-2 weeks to follow electrolytes   Home Health: PT   Discharge Condition: STABLE   CODE STATUS: DNR  DIET: 2 gram sodium restricted carb modified    Brief Hospitalization Summary: Please see all hospital notes, images, labs for full details of the hospitalization. Brief Admission History:  76 y.o. 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  He endorses lower extremity weakness and denies any loss of consciousness or head injury.  He is noted to have some bruising and lacerations on his extremities and notes increasing tiredness.  Much of this appears to have started since his NSTEMI approximately 1 month ago.  He has been taking Lasix as well as potassium since then and has had some intermittent gastrointestinal symptoms of epigastric abdominal pain as well as some mild diarrhea.  He does to have some pitting edema to bilateral knees.   ED Course: Vital signs stable and patient is afebrile.  He is noted to have hemoglobin 7.4 and platelet count of 72,000.  Potassium 5.9 in the setting of creatinine 3.77 with baseline near 2 from several weeks to months ago.  Troponin 127 and glucose 321.  He is noted to have some left-sided rib fractures likely related to the fall.  He has been given  Lokelma and calcium gluconate per nephrology recommendations in the ED.  HOSPITAL COURSE BY PROBLEM   Assessment and Plan:   Acute combined HF - he was treated with high dose IV furosemide with good results - His weight is down to 200# and he has diuresed over 14 liters - cardiology recommended transition to oral lasix 40 mg BID    Paroxysmal Afib - he is on apixaban for full anticoagulation  - cardiology reports plan to resume metoprolol XL 25 mg at DC and continue amiodarone 100 mg daily   Hyperlipidemia - resume home atorvastation, ezetimibe   Cellulitis of right foot - improving on IV high dose ceftriaxone - DC on oral cefdinir x 3 more days   CAD - pt reportedly had refused CABG and ICD - continue medical mgmt   Hyperkalemia - treated and resolved   Type 2 DM, with hyperglycemia  - resume home treatment program except stop taking farxiga  CBG (last 3)  Recent Labs (last 2 labs)       Recent Labs    12/20/21 0324 12/20/21 0719 12/20/21 1106  GLUCAP 184* 168* 239*      Stage IV CKD  - appreciate nephrology assistance with diuresis - pt has outpatient follow up with primary nephrologist - creatinine improved to 2.60 on IV diuresis   Anemia of CKD - Hg up to 10  - s/p IV iron infusion ordered by nephrologist   Thrombocypenia - may need to hold apixaban if platelets drop below 50k   Obesity  - BMP 31-32 - lifestyle modifications  recommended   UTI with dysuria - follow up urine culture - treated with IV ceftriaxone as ordered - s/p 1 dose of pyridium  - dysuria symptoms improved - DC home on oral cefdinir x 3 days     DVT prophylaxis: apixaban  Code Status: full  Family Communication: bedside update 11/8, 11/10, 11/11 Disposition: Status is: Inpatient Remains inpatient appropriate because: requires IV furosemide    Consultants:  Cardiology Nephrology    Discharge Diagnoses:  Principal Problem:   Weakness Active Problems:   Essential  hypertension   Insulin dependent type 2 diabetes mellitus ([place])   Hyperlipidemia   CKD (chronic kidney disease) stage 4, GFR 15-29 ml/min (HCC)   Thrombocytopenia ([place])   Cerebrovascular accident (CVA) ([place])   Diabetic nephropathy associated with type 2 diabetes mellitus ([place])   Immunosuppression ([place])   Long-term use of immunosuppressant medication   NSTEMI (non-ST elevated myocardial infarction) ([place])   DNR (do not resuscitate)   CAD (coronary artery disease)/Prior MI   Obesity (BMI 30-39.9)   Acute on chronic congestive heart failure (HCC)   Paroxysmal A-fib [facility])   Discharge Instructions:  Allergies as of 12/21/2021       Reactions   Tape Rash   Use paper tape.    Trazodone And Nefazodone Palpitations   tachycardia   Mirtazapine    imbalance   Elemental Sulfur Rash   Sulfa Antibiotics Rash        Medication List     STOP taking these medications    dapagliflozin propanediol 10 MG Tabs tablet Commonly known as: Farxiga   sodium bicarbonate 650 MG tablet   sulfamethoxazole-trimethoprim 400-80 MG tablet Commonly known as: BACTRIM       TAKE these medications    acetaminophen 500 MG tablet Commonly known as: TYLENOL Take 500 mg by mouth every 8 (eight) hours as needed for moderate pain.   albuterol 108 (90 Base) MCG/ACT inhaler Commonly known as: VENTOLIN HFA Inhale 2 puffs into the lungs every 6 (six) hours as needed for wheezing or shortness of breath.   ALPRAZolam 0.25 MG tablet Commonly known as: XANAX Take 1 tablet (0.25 mg total) by mouth at bedtime as needed for anxiety.   amiodarone 200 MG tablet Commonly known as: PACERONE Take 0.5 tablets (100 mg total) by mouth daily.   atorvastatin 40 MG tablet Commonly known as: LIPITOR Take 1 tablet (40 mg total) by mouth daily.   cefdinir 300 MG capsule Commonly known as: OMNICEF Take 1 capsule (300 mg total) by mouth daily for 3 days.   Eliquis 5 MG Tabs tablet Generic drug: apixaban Take 1  tablet by mouth twice daily   ezetimibe 10 MG tablet Commonly known as: Zetia Take 1 tablet (10 mg total) by mouth daily.   fluticasone 50 MCG/ACT nasal spray Commonly known as: FLONASE Place 1 spray into both nostrils daily as needed for allergies.   furosemide 40 MG tablet Commonly known as: LASIX Take 1 tablet (40 mg total) by mouth 2 (two) times daily. What changed: medication strength   insulin NPH Human 100 UNIT/ML injection Commonly known as: NOVOLIN N 10 units AC breakfast and 10 units AC supper   insulin regular 100 units/mL injection Commonly known as: NOVOLIN R 16 units w bkfst and 13 units w dinner   Lokelma 10 g Pack packet Generic drug: sodium zirconium cyclosilicate Take 10 g by mouth as directed. By the HF clinic just as needed   loratadine 10 MG tablet Commonly known as:  CLARITIN Take 10 mg by mouth daily as needed for allergies.   metoprolol succinate 25 MG 24 hr tablet Commonly known as: TOPROL-XL Take 1 tablet (25 mg total) by mouth daily. Take with or immediately following a meal. What changed:  medication strength how much to take   mycophenolate 250 MG capsule Commonly known as: CELLCEPT Take 500 mg by mouth 2 (two) times daily.   nitroGLYCERIN 0.4 MG SL tablet Commonly known as: NITROSTAT Place 1 tablet (0.4 mg total) under the tongue every 5 (five) minutes as needed for chest pain.   ondansetron 4 MG tablet Commonly known as: ZOFRAN Take 1 tablet (4 mg total) by mouth every 8 (eight) hours as needed for nausea or vomiting.   predniSONE 5 MG tablet Commonly known as: DELTASONE Take 5 mg by mouth daily with breakfast.   spironolactone 25 MG tablet Commonly known as: ALDACTONE Take 0.5 tablets (12.5 mg total) by mouth daily. What changed: how much to take   tacrolimus 0.5 MG capsule Commonly known as: PROGRAF Take 1 mg by mouth in the morning and at bedtime.   Verquvo 2.5 MG Tabs Generic drug: Vericiguat Take 2.5 mg by mouth  daily.        Follow-up Information     Stacks, [name], MD. Schedule an appointment as soon as possible for a visit in 1 week(s).   Specialty: Family Medicine Why: Hospital Follow Up Contact information: [place] [address] [phone]         Larey Dresser, MD. Schedule an appointment as soon as possible for a visit in 2 week(s).   Specialty: Cardiology Why: Hospital Follow Up Contact information: [address] [place] [address] [phone]                Allergies  Allergen Reactions   Tape Rash    Use paper tape.    Trazodone And Nefazodone Palpitations    tachycardia   Mirtazapine     imbalance   Elemental Sulfur Rash   Sulfa Antibiotics Rash   Allergies as of 12/21/2021       Reactions   Tape Rash   Use paper tape.    Trazodone And Nefazodone Palpitations   tachycardia   Mirtazapine    imbalance   Elemental Sulfur Rash   Sulfa Antibiotics Rash        Medication List     STOP taking these medications    dapagliflozin propanediol 10 MG Tabs tablet Commonly known as: Farxiga   sodium bicarbonate 650 MG tablet   sulfamethoxazole-trimethoprim 400-80 MG tablet Commonly known as: BACTRIM       TAKE these medications    acetaminophen 500 MG tablet Commonly known as: TYLENOL Take 500 mg by mouth every 8 (eight) hours as needed for moderate pain.   albuterol 108 (90 Base) MCG/ACT inhaler Commonly known as: VENTOLIN HFA Inhale 2 puffs into the lungs every 6 (six) hours as needed for wheezing or shortness of breath.   ALPRAZolam 0.25 MG tablet Commonly known as: XANAX Take 1 tablet (0.25 mg total) by mouth at bedtime as needed for anxiety.   amiodarone 200 MG tablet Commonly known as: PACERONE Take 0.5 tablets (100 mg total) by mouth daily.   atorvastatin 40 MG tablet Commonly known as: LIPITOR Take 1 tablet (40 mg total) by mouth daily.   cefdinir 300 MG capsule Commonly known as:  OMNICEF Take 1 capsule (300 mg total) by mouth daily for  3 days.   Eliquis 5 MG Tabs tablet Generic drug: apixaban Take 1 tablet by mouth twice daily   ezetimibe 10 MG tablet Commonly known as: Zetia Take 1 tablet (10 mg total) by mouth daily.   fluticasone 50 MCG/ACT nasal spray Commonly known as: FLONASE Place 1 spray into both nostrils daily as needed for allergies.   furosemide 40 MG tablet Commonly known as: LASIX Take 1 tablet (40 mg total) by mouth 2 (two) times daily. What changed: medication strength   insulin NPH Human 100 UNIT/ML injection Commonly known as: NOVOLIN N 10 units AC breakfast and 10 units AC supper   insulin regular 100 units/mL injection Commonly known as: NOVOLIN R 16 units w bkfst and 13 units w dinner   Lokelma 10 g Pack packet Generic drug: sodium zirconium cyclosilicate Take 10 g by mouth as directed. By the HF clinic just as needed   loratadine 10 MG tablet Commonly known as: CLARITIN Take 10 mg by mouth daily as needed for allergies.   metoprolol succinate 25 MG 24 hr tablet Commonly known as: TOPROL-XL Take 1 tablet (25 mg total) by mouth daily. Take with or immediately following a meal. What changed:  medication strength how much to take   mycophenolate 250 MG capsule Commonly known as: CELLCEPT Take 500 mg by mouth 2 (two) times daily.   nitroGLYCERIN 0.4 MG SL tablet Commonly known as: NITROSTAT Place 1 tablet (0.4 mg total) under the tongue every 5 (five) minutes as needed for chest pain.   ondansetron 4 MG tablet Commonly known as: ZOFRAN Take 1 tablet (4 mg total) by mouth every 8 (eight) hours as needed for nausea or vomiting.   predniSONE 5 MG tablet Commonly known as: DELTASONE Take 5 mg by mouth daily with breakfast.   spironolactone 25 MG tablet Commonly known as: ALDACTONE Take 0.5 tablets (12.5 mg total) by mouth daily. What changed: how much to take   tacrolimus 0.5 MG capsule Commonly known as:  PROGRAF Take 1 mg by mouth in the morning and at bedtime.   Verquvo 2.5 MG Tabs Generic drug: Vericiguat Take 2.5 mg by mouth daily.        Procedures/Studies: US Renal Transplant w/Doppler  Result Date: 12/14/2021 CLINICAL DATA:  Worsening kidney function.  Evaluate renal graft EXAM: ULTRASOUND OF RENAL TRANSPLANT WITH RENAL DOPPLER ULTRASOUND TECHNIQUE: Ultrasound examination of the renal transplant was performed with gray-scale, color and duplex doppler evaluation. COMPARISON:  11/19/2021 FINDINGS: Transplant kidney location: Right lower quadrant Transplant Kidney: Renal measurements: 9.8 x 6.2 x 6.8 cm = volume: 214.8m. Normal in size and parenchymal echogenicity. No evidence of hydronephrosis. No peri-transplant fluid collection seen. Well-circumscribed anechoic cyst is identified measuring 3.2 x 1.8 x 2.6 cm. Similar to previous exam. Color flow in the main renal artery:  Yes Color flow in the main renal vein:  Yes Duplex Doppler Evaluation: Main Renal Artery Velocity: 93.1 cm/sec Main Renal Artery Resistive Index: 0.86 Venous waveform in main renal vein:  Present Intrarenal resistive index in upper pole:  0.78 (normal 0.6-0.8; equivocal 0.8-0.9; abnormal >= 0.9) Intrarenal resistive index in lower pole: 0.73 (normal 0.6-0.8; equivocal 0.8-0.9; abnormal >= 0.9) Bladder: Bladder is fully distended and appears normal. Ureteral jets not visualized however. Other findings: None. IMPRESSION: 1. Patent renal vasculature. The intra renal resistive indices in the upper and lower pole are within normal limits. 2. No hydronephrosis. Electronically Signed   By: TKerby MoorsM.D.   On: 12/14/2021 13:10   DG  Chest 1 View  Result Date: 12/14/2021 CLINICAL DATA:  Multiple falls. EXAM: CHEST  1 VIEW COMPARISON:  November 16, 2021. FINDINGS: Stable cardiomediastinal silhouette. Both lungs are clear. Mildly displaced left sixth, seventh and eighth rib fractures. IMPRESSION: Mildly displaced left rib fractures  as described above. No acute cardiopulmonary abnormality seen. Electronically Signed   By: Marijo Conception M.D.   On: 12/14/2021 11:43   DG Foot Complete Right  Result Date: 12/14/2021 CLINICAL DATA:  Multiple falls. EXAM: RIGHT FOOT COMPLETE - 3+ VIEW COMPARISON:  None Available. FINDINGS: There is no evidence of fracture or dislocation. There is no evidence of arthropathy. Mild posterior calcaneal spurring is noted. Vascular calcifications are noted. IMPRESSION: No acute abnormality is noted. Electronically Signed   By: Marijo Conception M.D.   On: 12/14/2021 11:40   DG Elbow Complete Right  Result Date: 12/14/2021 CLINICAL DATA:  Right elbow pain after fall. EXAM: RIGHT ELBOW - COMPLETE 3+ VIEW COMPARISON:  None Available. FINDINGS: There is no evidence of fracture, dislocation, or joint effusion. There is no evidence of arthropathy or other focal bone abnormality. Soft tissues are unremarkable. IMPRESSION: Negative. Electronically Signed   By: Marijo Conception M.D.   On: 12/14/2021 11:39   DG HIPS BILAT WITH PELVIS MIN 5 VIEWS  Result Date: 12/14/2021 CLINICAL DATA:  Multiple falls. EXAM: DG HIP (WITH OR WITHOUT PELVIS) 5+V BILAT COMPARISON:  October 29, 2021. FINDINGS: There is no evidence of hip fracture or dislocation. Mild to moderate osteophyte formation is seen involving both hips. 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 No acute abnormality seen. Electronically Signed   By: Marijo Conception M.D.   On: 12/14/2021 11:38     Subjective: Pt reports that he is feeling much better and really would like to go home, no specific complaints.   Discharge Exam: Vitals:   12/21/21 0524 12/21/21 0914  BP: (!) 130/56 (!) 130/52  Pulse: 68 70  Resp: 20   Temp: 97.9 F (36.6 C) 98.3 F (36.8 C)  SpO2: 98% 97%   Vitals:   12/20/21 1414 12/20/21 1938 12/21/21 0524 12/21/21 0914  BP: (!) 135/52 (!) 132/55 (!) 130/56 (!) 130/52  Pulse: 83 75 68 70   Resp: '16 20 20   '$ Temp: 97.7 F (36.5 C) 98.3 F (36.8 C) 97.9 F (36.6 C) 98.3 F (36.8 C)  TempSrc: Oral  Oral Oral  SpO2: 100% 100% 98% 97%  Weight:   91.4 kg    General: Pt is alert, awake, not in acute distress Cardiovascular: normal S1/S2 +, no rubs, no gallops Respiratory: CTA bilaterally, no wheezing, no rhonchi Abdominal: Soft, NT, ND, bowel sounds + Extremities: no edema, no cyanosis   The results of significant diagnostics from this hospitalization (including imaging, microbiology, ancillary and laboratory) are listed below for reference.     Microbiology: Recent Results (from the past 240 hour(s))  MRSA Next Gen by PCR, Nasal     Status: Abnormal   Collection Time: 12/14/21  5:34 PM   Specimen: Nasal Mucosa; Nasal Swab  Result Value Ref Range Status   MRSA by PCR Next Gen DETECTED (A) NOT DETECTED Final    Comment: RESULT CALLED TO, READ BACK BY AND VERIFIED WITH: JESSICA ELLER @ 1941 ON 12/14/21 C VARNER (NOTE) The GeneXpert MRSA Assay (FDA approved for NASAL specimens only), is one component of a comprehensive MRSA colonization surveillance program. It is not intended to diagnose MRSA infection nor  to guide or monitor treatment for MRSA infections. Test performance is not FDA approved in patients less than 48 years old. Performed at [facility], [address], [place], [place] [address]   Urine Culture     Status: Abnormal   Collection Time: 12/19/21 11:23 AM   Specimen: Urine, Clean Catch  Result Value Ref Range Status   Specimen Description   Final    URINE, CLEAN CATCH Performed at [facility], [address], [place], [place] [address]    Special Requests   Final    NONE Performed at [facility], [address] [place], [place] [address]    Culture (A)  Final    <10,000 COLONIES/mL INSIGNIFICANT GROWTH Performed at [facility] [address] [place], [place] [address]    Report Status 12/21/2021 FINAL  Final     Labs: BNP (last 3  results) Recent Labs    12/02/21 1008 12/09/21 1016 12/14/21 1101  BNP 4,908.1* >4,500.0* >3,474.2*   Basic Metabolic Panel: Recent Labs  Lab 12/15/21 0342 12/16/21 0346 12/17/21 0414 12/18/21 0348 12/19/21 0554 12/20/21 0731 12/21/21 0451  NA 134* 133* 135 136 133* 134* 134*  K 4.9 4.3 3.7 3.9 3.7 3.8 3.9  CL 103 100 99 99 97* 101 101  CO2 '23 25 26 26 26 25 24  '$ GLUCOSE 148* 208* 166* 160* 134* 162* 182*  BUN 72* 73* 68* 63* 57* 53* 49*  CREATININE 3.74* 3.60* 3.43* 3.31* 3.04* 2.69* 2.60*  CALCIUM 7.7* 7.5* 7.7* 7.7* 7.6* 7.7* 7.6*  MG 2.9* 2.7* 2.5* 2.4  --   --  2.4   Liver Function Tests: Recent Labs  Lab 12/14/21 1101 12/15/21 0342  AST 16 14*  ALT 15 15  ALKPHOS 74 67  BILITOT 1.5* 0.9  PROT 5.4* 5.1*  ALBUMIN 3.1* 2.9*   No results for input(s): "LIPASE", "AMYLASE" in the last 168 hours. No results for input(s): "AMMONIA" in the last 168 hours. CBC: Recent Labs  Lab 12/14/21 1101 12/15/21 0342 12/16/21 0346 12/17/21 0414 12/18/21 0348  WBC 4.3 4.1 3.7* 4.0 5.5  HGB 7.4* 7.7* 8.6* 9.6* 10.1*  HCT 25.3* 26.6* 28.0* 31.2* 33.0*  MCV 94.4 93.7 90.9 90.4 90.2  PLT 72* 73* 78* 93* 115*   Cardiac Enzymes: No results for input(s): "CKTOTAL", "CKMB", "CKMBINDEX", "TROPONINI" in the last 168 hours. BNP: Invalid input(s): "POCBNP" CBG: Recent Labs  Lab 12/20/21 0719 12/20/21 1106 12/20/21 1614 12/20/21 2119 12/21/21 0741  GLUCAP 168* 239* 286* 332* 153*   D-Dimer No results for input(s): "DDIMER" in the last 72 hours. Hgb A1c No results for input(s): "HGBA1C" in the last 72 hours. Lipid Profile No results for input(s): "CHOL", "HDL", "LDLCALC", "TRIG", "CHOLHDL", "LDLDIRECT" in the last 72 hours. Thyroid function studies No results for input(s): "TSH", "T4TOTAL", "T3FREE", "THYROIDAB" in the last 72 hours.  Invalid input(s): "FREET3" Anemia work up No results for input(s): "VITAMINB12", "FOLATE", "FERRITIN", "TIBC", "IRON", "RETICCTPCT" in  the last 72 hours. Urinalysis    Component Value Date/Time   COLORURINE YELLOW 12/18/2021 0748   APPEARANCEUR CLOUDY (A) 12/18/2021 0748   APPEARANCEUR Clear 04/17/2020 1629   LABSPEC 1.009 12/18/2021 0748   PHURINE 7.0 12/18/2021 0748   GLUCOSEU NEGATIVE 12/18/2021 0748   HGBUR MODERATE (A) 12/18/2021 0748   BILIRUBINUR NEGATIVE 12/18/2021 0748   BILIRUBINUR Negative 04/17/2020 [place] 12/18/2021 0748   PROTEINUR 100 (A) 12/18/2021 0748   NITRITE NEGATIVE 12/18/2021 0748   LEUKOCYTESUR LARGE (A) 12/18/2021  3716   Sepsis Labs Recent Labs  Lab 12/15/21 0342 12/16/21 0346 12/17/21 0414 12/18/21 0348  WBC 4.1 3.7* 4.0 5.5   Microbiology Recent Results (from the past 240 hour(s))  MRSA Next Gen by PCR, Nasal     Status: Abnormal   Collection Time: 12/14/21  5:34 PM   Specimen: Nasal Mucosa; Nasal Swab  Result Value Ref Range Status   MRSA by PCR Next Gen DETECTED (A) NOT DETECTED Final    Comment: RESULT CALLED TO, READ BACK BY AND VERIFIED WITH: JESSICA ELLER @ 1941 ON 12/14/21 C VARNER (NOTE) The GeneXpert MRSA Assay (FDA approved for NASAL specimens only), is one component of a comprehensive MRSA colonization surveillance program. It is not intended to diagnose MRSA infection nor to guide or monitor treatment for MRSA infections. Test performance is not FDA approved in patients less than 46 years old. Performed at [facility], [address], [place], [place] [address]   Urine Culture     Status: Abnormal   Collection Time: 12/19/21 11:23 AM   Specimen: Urine, Clean Catch  Result Value Ref Range Status   Specimen Description   Final    URINE, CLEAN CATCH Performed at [facility], [address], [place], [place] [address]    Special Requests   Final    NONE Performed at [facility], [address], [place], [place] [address]    Culture (A)  Final    <10,000 COLONIES/mL INSIGNIFICANT GROWTH Performed at [facility] [address],  [place],  [address]    Report Status 12/21/2021 FINAL  Final    Time coordinating discharge: 43 mins   SIGNED:  Irwin Brakeman, MD  [facility] 12/21/2021, 10:58 AM How to contact the [facility] Attending or Consulting provider [place] or covering provider during after hours [place], for this patient?  Check the care team in [facility] and look for a) attending/consulting [facility] provider listed and b) the [facility] team listed Log into [email or web address] and use [facility] universal password to access. If you do not have the password, please contact the hospital operator. Locate the [facility] provider you are looking for under [facility] and page to a number that you can be directly reached. If you still have difficulty reaching the provider, please page the [facility] (Director on Call) for the Hospitalists listed on amion for assistance.

## 2021-12-21 NOTE — Progress Notes (Signed)
Patient ID: Daniel Myers, male   DOB: 06-14-1945, 76 y.o.   MRN: [id number] S: Feels well and wants to go home.  Diuresed well overnight. O:BP (!) 130/56 (BP Location: Left Arm)   Pulse 68   Temp 97.9 F (36.6 C) (Oral)   Resp 20   Wt 91.4 kg   SpO2 98%   BMI 28.91 kg/m   Intake/Output Summary (Last 24 hours) at 12/21/2021 0907 Last data filed at 12/21/2021 0500 Gross per 24 hour  Intake 480 ml  Output 2600 ml  Net -2120 ml   Intake/Output: I/O last 3 completed shifts: In: 480 [P.O.:480] Out: 3550 [Urine:3550]  Intake/Output this shift:  No intake/output data recorded. Weight change: 0.3 kg Gen:NAD CVS: RRR Resp: CTA Abd: +BS, soft, NT/ND Ext: trace pretibial edema   Recent Labs  Lab 12/14/21 1101 12/15/21 0342 12/16/21 0346 12/17/21 0414 12/18/21 0348 12/19/21 0554 12/20/21 0731 12/21/21 0451  NA 134* 134* 133* 135 136 133* 134* 134*  K 5.9* 4.9 4.3 3.7 3.9 3.7 3.8 3.9  CL 103 103 100 99 99 97* 101 101  CO2 '23 23 25 26 26 26 25 24  '$ GLUCOSE 321* 148* 208* 166* 160* 134* 162* 182*  BUN 73* 72* 73* 68* 63* 57* 53* 49*  CREATININE 3.77* 3.74* 3.60* 3.43* 3.31* 3.04* 2.69* 2.60*  ALBUMIN 3.1* 2.9*  --   --   --   --   --   --   CALCIUM 7.9* 7.7* 7.5* 7.7* 7.7* 7.6* 7.7* 7.6*  AST 16 14*  --   --   --   --   --   --   ALT 15 15  --   --   --   --   --   --    Liver Function Tests: Recent Labs  Lab 12/14/21 1101 12/15/21 0342  AST 16 14*  ALT 15 15  ALKPHOS 74 67  BILITOT 1.5* 0.9  PROT 5.4* 5.1*  ALBUMIN 3.1* 2.9*   No results for input(s): "LIPASE", "AMYLASE" in the last 168 hours. No results for input(s): "AMMONIA" in the last 168 hours. CBC: Recent Labs  Lab 12/14/21 1101 12/15/21 0342 12/16/21 0346 12/17/21 0414 12/18/21 0348  WBC 4.3 4.1 3.7* 4.0 5.5  HGB 7.4* 7.7* 8.6* 9.6* 10.1*  HCT 25.3* 26.6* 28.0* 31.2* 33.0*  MCV 94.4 93.7 90.9 90.4 90.2  PLT 72* 73* 78* 93* 115*   Cardiac Enzymes: No results for input(s): "CKTOTAL", "CKMB",  "CKMBINDEX", "TROPONINI" in the last 168 hours. CBG: Recent Labs  Lab 12/20/21 0719 12/20/21 1106 12/20/21 1614 12/20/21 2119 12/21/21 0741  GLUCAP 168* 239* 286* 332* 153*    Iron Studies: No results for input(s): "IRON", "TIBC", "TRANSFERRIN", "FERRITIN" in the last 72 hours. Studies/Results: No results found.  sodium chloride   Intravenous Once   amiodarone  100 mg Oral Daily   apixaban  5 mg Oral BID   atorvastatin  40 mg Oral Daily   ezetimibe  10 mg Oral Daily   furosemide  60 mg Intravenous Q12H   insulin aspart  0-5 Units Subcutaneous QHS   insulin aspart  0-9 Units Subcutaneous TID WC   insulin NPH Human  15 Units Subcutaneous BID AC   mycophenolate  500 mg Oral BID   predniSONE  5 mg Oral Q breakfast   sodium chloride flush  3 mL Intravenous Q12H   spironolactone  12.5 mg Oral QHS   tacrolimus  1 mg Oral BID   Vericiguat  2.5 mg Oral Daily    BMET    Component Value Date/Time   NA 134 (L) 12/21/2021 0451   NA 133 (L) 12/02/2021 1008   K 3.9 12/21/2021 0451   CL 101 12/21/2021 0451   CO2 24 12/21/2021 0451   GLUCOSE 182 (H) 12/21/2021 0451   BUN 49 (H) 12/21/2021 0451   BUN 61 (H) 12/02/2021 1008   CREATININE 2.60 (H) 12/21/2021 0451   CALCIUM 7.6 (L) 12/21/2021 0451   GFRNONAA 25 (L) 12/21/2021 0451   GFRAA 49 (L) 04/03/2020 1027   CBC    Component Value Date/Time   WBC 5.5 12/18/2021 0348   RBC 3.66 (L) 12/18/2021 0348   HGB 10.1 (L) 12/18/2021 0348   HGB 8.0 (LL) 12/02/2021 1008   HCT 33.0 (L) 12/18/2021 0348   HCT 25.7 (L) 12/02/2021 1008   PLT 115 (L) 12/18/2021 0348   PLT 158 12/02/2021 1008   MCV 90.2 12/18/2021 0348   MCV 90 12/02/2021 1008   MCH 27.6 12/18/2021 0348   MCHC 30.6 12/18/2021 0348   RDW 17.6 (H) 12/18/2021 0348   RDW 13.7 12/02/2021 1008   LYMPHSABS 0.6 (L) 12/02/2021 1008   MONOABS 0.5 10/29/2021 2112   EOSABS 0.1 12/02/2021 1008   BASOSABS 0.0 12/02/2021 1008    Assessment/ Plan: Pt is a 76 y.o. yo male with CAD  and also s/p renal transplant 15 years ago who was admitted on 12/14/2021 with  volume overload and A on CRF Assessment/Plan: 1. A on CRF-  baseline in the 2's but has worsened of late.  Has a recent history of a klebsiella UTI.  Realized that a urine has not been rechecked-  will order today.  Was on farxiga as OP-  currently on hold, would continue to hold.  No indications for dialysis at this time-   thankfully his BUN and crt continue to improve as well as volume status.  Nothing further to add.  He has an appointment with his primary nephrologist soon. Will sign off.  Please call with questions or concerns.  2. S/p renal transplant-  15 years ago-- continue prograf, cellcept and prednisone 3. CHF-  quite severe-  conservative management-  on vericiguat, lipitoro  other heart failure meds difficult due to CKD.  Seen by Cardiology and will transition to po diuretics and f/u with Dr. Aundra Dubin after discharge. 4. Afib-  amio and eliquis-  permanent Afib -  rate controlled 5. Volume overload-  thankfully responded to IV lasix-  says weight down now 16 pounds.  Also an element of third spacing with dec albumin-  have not yet transitioned to PO diuretics -  at a good place a liter negative last 24 hours-  now the choice of dose is not clear-  per cardiology  but put out 2.6 liters overnight.  Will transition to po diuretics and f/u with Dr. Aundra Dubin as above. 6. Anemia-  not helping - s/p transfusion 11/8-   iron low in past-  giving iron and ESA (darbe 300 on 11/8) 7. Potassium-  was high upon admission but corrected pretty quickly-  on lokelma as OP ?  Also aldactone-  not on either here and K is trending down.   added back low dose aldactone and K stable 8. Dispo-  seems to be approaching discharge -  will need f/u with cards and his nephrologist (R[place])  Donetta Potts, MD [facility]

## 2021-12-21 NOTE — Progress Notes (Addendum)
Rounding Note    Patient Name: Daniel Myers Date of Encounter: 12/21/2021  [place] Cardiologist: Rozann Lesches, MD   Subjective   Feels better breathing back to baseline  Inpatient Medications    Scheduled Meds:  sodium chloride   Intravenous Once   amiodarone  100 mg Oral Daily   apixaban  5 mg Oral BID   atorvastatin  40 mg Oral Daily   ezetimibe  10 mg Oral Daily   furosemide  60 mg Intravenous Q12H   insulin aspart  0-5 Units Subcutaneous QHS   insulin aspart  0-9 Units Subcutaneous TID WC   insulin NPH Human  15 Units Subcutaneous BID AC   mycophenolate  500 mg Oral BID   predniSONE  5 mg Oral Q breakfast   sodium chloride flush  3 mL Intravenous Q12H   spironolactone  12.5 mg Oral QHS   tacrolimus  1 mg Oral BID   Vericiguat  2.5 mg Oral Daily   Continuous Infusions:  sodium chloride     cefTRIAXone (ROCEPHIN)  IV 2 g (12/20/21 1630)   PRN Meds: sodium chloride, acetaminophen **OR** acetaminophen, albuterol, ALPRAZolam, fentaNYL (SUBLIMAZE) injection, fluticasone, loratadine, ondansetron **OR** ondansetron (ZOFRAN) IV, sodium chloride flush   Vital Signs    Vitals:   12/20/21 0446 12/20/21 1414 12/20/21 1938 12/21/21 0524  BP:  (!) 135/52 (!) 132/55 (!) 130/56  Pulse:  83 75 68  Resp:  '16 20 20  '$ Temp:  97.7 F (36.5 C) 98.3 F (36.8 C) 97.9 F (36.6 C)  TempSrc:  Oral  Oral  SpO2:  100% 100% 98%  Weight: 91.1 kg   91.4 kg    Intake/Output Summary (Last 24 hours) at 12/21/2021 0856 Last data filed at 12/21/2021 0500 Gross per 24 hour  Intake 480 ml  Output 2600 ml  Net -2120 ml      12/21/2021    5:24 AM 12/20/2021    4:46 AM 12/19/2021    5:00 AM  Last 3 Weights  Weight (lbs) 201 lb 8 oz 200 lb 13.4 oz 199 lb 8 oz  Weight (kg) 91.4 kg 91.1 kg 90.493 kg      Telemetry    Not on tele - Personally Reviewed  ECG      Physical Exam   GEN: No acute distress.   Neck: No JVD Cardiac: RRR, no murmurs, rubs, or  gallops.  Respiratory: Clear to auscultation bilaterally. GI: Soft, nontender, non-distended  MS: No edema; No deformity. Neuro:  Nonfocal  Psych: Normal affect   Labs    High Sensitivity Troponin:   Recent Labs  Lab 12/14/21 1101 12/14/21 1357  TROPONINIHS 127* 130*     Chemistry Recent Labs  Lab 12/14/21 1101 12/15/21 0342 12/16/21 0346 12/17/21 0414 12/18/21 0348 12/19/21 0554 12/20/21 0731 12/21/21 0451  NA 134* 134*   < > 135 136 133* 134* 134*  K 5.9* 4.9   < > 3.7 3.9 3.7 3.8 3.9  CL 103 103   < > 99 99 97* 101 101  CO2 23 23   < > '26 26 26 25 24  '$ GLUCOSE 321* 148*   < > 166* 160* 134* 162* 182*  BUN 73* 72*   < > 68* 63* 57* 53* 49*  CREATININE 3.77* 3.74*   < > 3.43* 3.31* 3.04* 2.69* 2.60*  CALCIUM 7.9* 7.7*   < > 7.7* 7.7* 7.6* 7.7* 7.6*  MG  --  2.9*   < > 2.5*  2.4  --   --  2.4  PROT 5.4* 5.1*  --   --   --   --   --   --   ALBUMIN 3.1* 2.9*  --   --   --   --   --   --   AST 16 14*  --   --   --   --   --   --   ALT 15 15  --   --   --   --   --   --   ALKPHOS 74 67  --   --   --   --   --   --   BILITOT 1.5* 0.9  --   --   --   --   --   --   GFRNONAA 16* 16*   < > 18* 19* 21* 24* 25*  ANIONGAP 8 8   < > '10 11 10 8 9   '$ < > = values in this interval not displayed.    Lipids No results for input(s): "CHOL", "TRIG", "HDL", "LABVLDL", "LDLCALC", "CHOLHDL" in the last 168 hours.  Hematology Recent Labs  Lab 12/16/21 0346 12/17/21 0414 12/18/21 0348  WBC 3.7* 4.0 5.5  RBC 3.08* 3.45* 3.66*  HGB 8.6* 9.6* 10.1*  HCT 28.0* 31.2* 33.0*  MCV 90.9 90.4 90.2  MCH 27.9 27.8 27.6  MCHC 30.7 30.8 30.6  RDW 17.6* 17.5* 17.6*  PLT 78* 93* 115*   Thyroid No results for input(s): "TSH", "FREET4" in the last 168 hours.  BNP Recent Labs  Lab 12/14/21 1101  BNP >4,500.0*    DDimer No results for input(s): "DDIMER" in the last 168 hours.   Radiology    No results found.  Cardiac Studies    Echo 11/17/21  IMPRESSIONS     1. Left ventricular  ejection fraction, by estimation, is 20 to 25%. The  left ventricle has severely decreased function. The left ventricle  demonstrates global hypokinesis. Left ventricular diastolic parameters are  consistent with Grade III diastolic  dysfunction (restrictive).   2. Right ventricular systolic function is mildly reduced. The right  ventricular size is normal.   3. Left atrial size was moderately dilated.   4. Right atrial size was moderately dilated.   5. The mitral valve is normal in structure. No evidence of mitral valve  regurgitation. No evidence of mitral stenosis.   6. The aortic valve is tricuspid. Aortic valve regurgitation is not  visualized. No aortic stenosis is present.   7. The inferior vena cava is normal in size with greater than 50%  respiratory variability, suggesting right atrial pressure of 3 mmHg.   Comparison(s): Prior images reviewed side by side. Prior EF described as  30-35%.   Patient Profile     76 y.o. male  known to have multivessel CAD in 2022 with LVEF 20 to 25% (refused CABG and ICD but will be high risk of LAD CTO intervention) in 10/23, paroxysmal A-fib status post DCCV in 10/2020, history of CVA, HTN, kidney transplant 15 years ago c/w stage III CKD who is currently followed by cardiology for the management of ASHF.  Assessment & Plan     Acute systolic and diastolic heart failure exacerbation -on IV Lasix 60 mg every 12 hours. Has diuresed 14 L. Weight down 200 lbs. Appears compensated. Consider transitioning to po lasix today.  -Have f/u with Dr. Aundra Dubin in AHF clinic-will schedule appt.    Keep K>4 and <5,  Mg>2 and <3. -Hold metoprolol succinate and continue amiodarone 100 mg once daily. -Patient did not tolerate ACE inhibitor due to hyperkalemia, did not tolerate Entresto due to hypotension, did not tolerate MRA either. Hold SGLT2 inhibitors. -Daily weights, ins and outs, 1.2 L fluid restriction -Patient refused ICD and hence not a candidate for  LifeVest   Paroxysmal A-fib, currently in NSR-now off telemetry -Hold metoprolol succinate and continue amiodarone 100 mg once daily.  Can switch to metoprolol succinate upon discharge and hold discontinue amiodarone upon discharge. -Continue Eliquis 5 mg twice daily.    Multivessel CAD (refused CABG and ICD) -Keep Hb more than 8 -Patient not on aspirin due to Eliquis. Continue intensity statin, atorvastatin 40 mg nightly and Zetia 10 mg once daily.   HLD -Continue high intensity statin, atorvastatin 40 mg nightly and Zetia 10 mg once daily.  CKD followed by renal. Crt 2.6 [facility] will sign off.   Medication Recommendations:   current meds with lasix 40 mg bid Other recommendations (labs, testing, etc):    Follow up as an outpatient:  will arrange OP f/u with Dr. Claris Gladden team in AHF  For questions or updates, please contact [place] Please consult [email or web address] for contact info under        Signed, Ermalinda Barrios, PA-C  12/21/2021, 8:56 AM    Attending note Patient seen and discussed with PA Bonnell Public, I agree with her documentation.  1.Acute on chronic HFrEF - 05/2020 echo LVE 25-30% - 10/2021 echo LVEF 30-35% - 11/2021 echo LVEF 20-25%, grade III dd, mild RV dysfunction - 09/2020 cath: 3 vessel CAD, CI 2.2, mean PA 16, PCWP 11, LVEDP 6  - -ICM, from notes has declined CABG - hyperkalemia on ACEi, low bp's on enresto, did not tolerate MRA based on notes. GFR as low a 16 this admit and also with recent UTI, farxiga stopped as also recommended by renal  Has been on vericiguat per HF clinic. BPs improved, restart toprol at lower dose '25mg'$  daily perhaps titrate over time.   - negative 2.1 L yesterday, neg 14 L since admission. He is on IV lasix '60mg'$  bid, Cr has been improving with diuresis consistent with venous congestion and CHF. Had been on lasix '40mg'$  once daily at home, changed to bid just days prior to admission. Will try at home oral lasix '40mg'$  bid at  discharge.     2.CAD  09/2020 cath: 3 vessel CAD, CI 2.2, mean PA 16, PCWP 11, LVEDP 6 - from notes has declined CABG - NSTEMI 11/2021 managed medically  3. PAF - continue amio, eliquis. Starting back toprol at lower dose '25mg'$  daily today.   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 We will arrange outpatient f/u with Dr Marigene Ehlers   Carlyle Dolly MD

## 2021-12-22 ENCOUNTER — Telehealth: Payer: Self-pay

## 2021-12-22 LAB — GLUCOSE, CAPILLARY: Glucose-Capillary: 152 mg/dL — ABNORMAL HIGH (ref 70–99)

## 2021-12-22 NOTE — Patient Outreach (Signed)
  Care Coordination TOC Note Transition Care Management Follow-up Telephone Call Date of discharge and from where: [facility] 12/14/21-12/21/21 How have you been since you were released from the hospital? Pre [name], patients daughter, he is doing well and getting around much better. Any questions or concerns? No  Items Reviewed: Did the pt receive and understand the discharge instructions provided? Yes  Medications obtained and verified? Yes  Other? No  Any new allergies since your discharge? No  Dietary orders reviewed? Yes Do you have support at home? Yes   Home Care and Equipment/Supplies: Were home health services ordered? yes If so, what is the name of the agency? [facility]  Has the agency set up a time to come to the patient's home? yes Were any new equipment or medical supplies ordered?  No What is the name of the medical supply agency? N/a Were you able to get the supplies/equipment? not applicable Do you have any questions related to the use of the equipment or supplies? No  Functional Questionnaire: (I = Independent and D = Dependent) ADLs: I  Bathing/Dressing- I  Meal Prep- I  Eating- I  Maintaining continence- I  Transferring/Ambulation- I  Managing Meds- I  Follow up appointments reviewed:  PCP Hospital f/u appt confirmed? No   Specialist Hospital f/u appt confirmed? No   Are transportation arrangements needed? No  If their condition worsens, is the pt aware to call PCP or go to the Emergency Dept.? Yes Was the patient provided with contact information for the PCP's office or ED? Yes Was to pt encouraged to call back with questions or concerns? Yes  SDOH assessments and interventions completed:   Yes  Care Coordination Interventions Activated:  Yes   Care Coordination Interventions:  PCP follow up appointment requested   Encounter Outcome:  Pt. Visit Completed

## 2021-12-25 ENCOUNTER — Ambulatory Visit: Payer: Self-pay | Admitting: *Deleted

## 2021-12-25 NOTE — Patient Outreach (Signed)
  Care Coordination   Initial Visit Note   01/07/2022 Name: Daniel Myers MRN: [id number] DOB: 1945-02-16  Daniel Myers is a 76 y.o. year old male who sees Stacks, Daniel Gash, MD for primary care. I spoke with Daniel Myers, daughter (on [employer] of  Daniel Myers by phone today.  What matters to the patients health and wellness today?  Getting better Lost weight weigh every morning      Goals Addressed               This Visit's Progress     Patient Stated     manage Congestive heart failure (THN) (pt-stated)   On track     Care Coordination Interventions: Advised patient to weigh each morning after emptying bladder Discussed importance of daily weight and advised patient to weigh and record daily        SDOH assessments and interventions completed:  Yes     Care Coordination Interventions Activated:  Yes  Care Coordination Interventions:  Yes, provided   Follow up plan: Follow up call scheduled for 12/29/21    Encounter Outcome:  Pt. Visit Completed   Daniel Batterman L. Lavina Hamman, RN, BSN, [place] Coordinator Office number [phone]

## 2021-12-26 ENCOUNTER — Other Ambulatory Visit: Payer: Self-pay | Admitting: Family Medicine

## 2021-12-26 DIAGNOSIS — G4700 Insomnia, unspecified: Secondary | ICD-10-CM

## 2021-12-28 ENCOUNTER — Ambulatory Visit (INDEPENDENT_AMBULATORY_CARE_PROVIDER_SITE_OTHER): Payer: Medicare Other | Admitting: Family Medicine

## 2021-12-28 ENCOUNTER — Encounter: Payer: Self-pay | Admitting: Family Medicine

## 2021-12-28 ENCOUNTER — Encounter: Payer: Self-pay | Admitting: *Deleted

## 2021-12-28 VITALS — BP 135/56 | HR 69 | Temp 97.4°F | Ht 70.0 in | Wt 200.0 lb

## 2021-12-28 DIAGNOSIS — I5043 Acute on chronic combined systolic (congestive) and diastolic (congestive) heart failure: Secondary | ICD-10-CM

## 2021-12-28 DIAGNOSIS — E1121 Type 2 diabetes mellitus with diabetic nephropathy: Secondary | ICD-10-CM

## 2021-12-28 DIAGNOSIS — I10 Essential (primary) hypertension: Secondary | ICD-10-CM | POA: Diagnosis not present

## 2021-12-28 DIAGNOSIS — G4700 Insomnia, unspecified: Secondary | ICD-10-CM

## 2021-12-28 MED ORDER — ALPRAZOLAM 0.25 MG PO TABS: 0.2500 mg | ORAL_TABLET | Freq: Every evening | ORAL | 1 refills | Status: AC | PRN

## 2021-12-28 MED ORDER — ALPRAZOLAM 0.25 MG PO TABS: 0.2500 mg | ORAL_TABLET | Freq: Every evening | ORAL | 1 refills | Status: DC | PRN

## 2021-12-28 NOTE — Progress Notes (Signed)
Subjective:  Patient ID: Daniel Myers, male    DOB: 09/27/1945  Age: 76 y.o. MRN: [id number]  CC: Hospitalization Follow-up   HPI Daniel Myers presents for transition of care due to hospitalization from 12/14/2021 tp 12/21/2021. He was treated for CHF. Acute on chronic combined type. 23 lb diuresed according to his daughter who accompanies him. HE was hospitalized 1 month before for acute MI. At that time LVEF on echo of 11/17/21 was 20-25. Currently he is in [facility] here. Uses a walker mostly at home. Dyspnea snd swelling are gone. Seems to do best on lasix 40 mg BID. Needs check for renal function. Is followed by nephrology and has early follow up scheduled.      12/28/2021    3:32 PM 12/02/2021    9:32 AM 11/10/2021    9:56 AM  Depression screen PHQ 2/9  Decreased Interest 0 0 0  Down, Depressed, Hopeless 0 0 0  PHQ - 2 Score 0 0 0    History Daniel Myers has a past medical history of Basal cell carcinoma (06/27/2013), Basal cell carcinoma (07/01/2014), Basal cell carcinoma (10/10/2014), Basal cell carcinoma (02/11/2015), Basal cell carcinoma (06/14/2016), Basal cell carcinoma (02/22/2017), Basal cell carcinoma (04/11/2018), Chronic kidney disease, History of renal transplant, Hyperlipidemia, Hypertension, NSTEMI (non-ST elevated myocardial infarction) ([place]) (02/13/2020), 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), 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), 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), 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), 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), 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), 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), Squamous cell carcinoma of skin (11/08/2011), Squamous cell carcinoma of skin (11/08/2011), Squamous cell carcinoma of skin (11/08/2011), 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), 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), 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), Squamous cell  carcinoma of skin (06/27/2013), 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), 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), 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), 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), Squamous cell carcinoma of skin (09/30/2015), Squamous cell carcinoma of skin (02/22/2017), Squamous cell carcinoma of skin (02/22/2017), Squamous cell carcinoma of skin (04/11/2018), Squamous cell carcinoma of skin (04/11/2018), Squamous cell carcinoma of skin (04/11/2018), Squamous cell carcinoma of skin (04/11/2018), Squamous cell carcinoma of skin (04/11/2018), Squamous cell carcinoma of skin (09/28/2018), Squamous cell carcinoma of skin (09/28/2018), Squamous cell carcinoma of skin (09/28/2018), Squamous cell carcinoma of skin (03/21/2019), Squamous cell carcinoma of skin (03/21/2019), 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   He has a past surgical history that includes Kidney transplant (Right); RIGHT/LEFT HEART CATH AND CORONARY ANGIOGRAPHY (N/A, 09/09/2020); and Cardioversion (N/A, 10/16/2020).   His family history includes Clotting disorder in his mother; Diabetes in his sister; Hypertension in his sister.He reports that he has been smoking pipe. He has never used smokeless tobacco. He reports that he does not drink alcohol and does not use drugs.    ROS Review of Systems  Constitutional:  Positive for fatigue.  HENT: Negative.    Eyes:  Negative for visual disturbance.  Respiratory:  Negative for cough. Shortness of breath: except with exertion.  Cardiovascular:  Negative for chest pain and leg swelling (resolved in hospital. Now monitoring.).  Gastrointestinal:  Negative for abdominal pain, diarrhea, nausea and vomiting.  Genitourinary:  Negative for difficulty urinating.  Musculoskeletal:  Positive for arthralgias. Negative for myalgias.  Skin:  Negative for rash.  Neurological:   Positive for weakness. Negative for headaches.  Psychiatric/Behavioral:  Negative for sleep disturbance.     Objective:  BP (!) 135/56   Pulse 69   Temp (!) 97.4 F (36.3 C)   Ht _0  (1.778 m)   Wt 200 lb (90.7 kg)   SpO2 95%   BMI 28.70 kg/m   BP Readings from Last 3 Encounters:  12/28/21 (!) 135/56  12/21/21 (!) 130/52  12/09/21 (!) 110/58    Wt Readings from Last 3 Encounters:  12/28/21 200 lb (90.7 kg)  12/21/21 201 lb 8 oz (91.4 kg)  12/09/21 226 lb (102.5 kg)     Physical Exam Vitals reviewed.  Constitutional:      General: He is not in acute distress.    Appearance: Normal appearance. He is well-developed. He is not ill-appearing.  HENT:     Head: Normocephalic and atraumatic.     Right Ear: External ear normal.     Left Ear: External ear normal.     Mouth/Throat:     Pharynx: No oropharyngeal exudate or posterior oropharyngeal erythema.  Eyes:     Pupils: Pupils are equal, round, and reactive to light.  Cardiovascular:     Rate and Rhythm: Normal rate and regular rhythm.     Heart sounds: Murmur heard.  Pulmonary:     Effort: No respiratory distress.     Breath sounds: Normal breath sounds.  Musculoskeletal:     Cervical back: Normal range of motion and neck supple.     Comments: In [facility] for entire appt.    Neurological:     Mental Status: He is alert and oriented to person, place, and time.       Assessment & Plan:   Daniel Myers was seen today for hospitalization follow-up.  Diagnoses and all orders for this visit:  Acute on chronic combined systolic and diastolic CHF (congestive heart failure) (HCC)  Insomnia, unspecified type -     Discontinue: ALPRAZolam (XANAX) 0.25 MG tablet; Take 1 tablet (0.25 mg total) by mouth at bedtime as needed for anxiety. -     Brain natriuretic peptide -     CBC with Differential/Platelet -     CMP14+EGFR -     ALPRAZolam (XANAX) 0.25 MG tablet; Take 1 tablet (0.25 mg total) by mouth at bedtime as needed for  anxiety.  Essential hypertension -     Brain natriuretic peptide -     CBC with Differential/Platelet -     CMP14+EGFR  Diabetic nephropathy associated with type 2 diabetes mellitus ([place]) -     Brain natriuretic peptide -     CBC with Differential/Platelet -     CMP14+EGFR       I am having Daniel Myers maintain his tacrolimus, mycophenolate, loratadine, predniSONE, ondansetron, acetaminophen, nitroGLYCERIN, insulin regular, insulin NPH Human, ezetimibe, amiodarone, Eliquis, atorvastatin, albuterol, Verquvo, fluticasone, furosemide, metoprolol succinate, spironolactone, Lokelma, and ALPRAZolam.  Allergies as of 12/28/2021       Reactions   Tape Rash   Use paper tape.    Trazodone And Nefazodone Palpitations   tachycardia   Mirtazapine    imbalance   Elemental Sulfur Rash   Sulfa Antibiotics Rash        Medication List        Accurate as of December 28, 2021  7:15 PM. If you have any questions, ask your nurse or doctor.          acetaminophen 500 MG tablet Commonly known as: TYLENOL Take 500 mg by mouth every 8 (eight)  hours as needed for moderate pain.   albuterol 108 (90 Base) MCG/ACT inhaler Commonly known as: VENTOLIN HFA Inhale 2 puffs into the lungs every 6 (six) hours as needed for wheezing or shortness of breath.   ALPRAZolam 0.25 MG tablet Commonly known as: XANAX Take 1 tablet (0.25 mg total) by mouth at bedtime as needed for anxiety.   amiodarone 200 MG tablet Commonly known as: PACERONE Take 0.5 tablets (100 mg total) by mouth daily.   atorvastatin 40 MG tablet Commonly known as: LIPITOR Take 1 tablet (40 mg total) by mouth daily.   Eliquis 5 MG Tabs tablet Generic drug: apixaban Take 1 tablet by mouth twice daily   ezetimibe 10 MG tablet Commonly known as: Zetia Take 1 tablet (10 mg total) by mouth daily.   fluticasone 50 MCG/ACT nasal spray Commonly known as: FLONASE Place 1 spray into both nostrils daily as needed for  allergies.   furosemide 40 MG tablet Commonly known as: LASIX Take 1 tablet (40 mg total) by mouth 2 (two) times daily.   insulin NPH Human 100 UNIT/ML injection Commonly known as: NOVOLIN N 10 units AC breakfast and 10 units AC supper   insulin regular 100 units/mL injection Commonly known as: NOVOLIN R 16 units w bkfst and 13 units w dinner   Lokelma 10 g Pack packet Generic drug: sodium zirconium cyclosilicate Take 10 g by mouth as directed. By the HF clinic just as needed   loratadine 10 MG tablet Commonly known as: CLARITIN Take 10 mg by mouth daily as needed for allergies.   metoprolol succinate 25 MG 24 hr tablet Commonly known as: TOPROL-XL Take 1 tablet (25 mg total) by mouth daily. Take with or immediately following a meal.   mycophenolate 250 MG capsule Commonly known as: CELLCEPT Take 500 mg by mouth 2 (two) times daily.   nitroGLYCERIN 0.4 MG SL tablet Commonly known as: NITROSTAT Place 1 tablet (0.4 mg total) under the tongue every 5 (five) minutes as needed for chest pain.   ondansetron 4 MG tablet Commonly known as: ZOFRAN Take 1 tablet (4 mg total) by mouth every 8 (eight) hours as needed for nausea or vomiting.   predniSONE 5 MG tablet Commonly known as: DELTASONE Take 5 mg by mouth daily with breakfast.   spironolactone 25 MG tablet Commonly known as: ALDACTONE Take 0.5 tablets (12.5 mg total) by mouth daily.   tacrolimus 0.5 MG capsule Commonly known as: PROGRAF Take 1 mg by mouth in the morning and at bedtime.   Verquvo 2.5 MG Tabs Generic drug: Vericiguat Take 2.5 mg by mouth daily.         Follow-up: Return in about 6 weeks (around 02/08/2022).  Claretta Fraise, M.D.

## 2021-12-30 LAB — CMP14+EGFR
ALT: 10 IU/L (ref 0–44)
AST: 17 IU/L (ref 0–40)
Albumin/Globulin Ratio: 2.1 (ref 1.2–2.2)
Albumin: 3.6 g/dL — ABNORMAL LOW (ref 3.8–4.8)
Alkaline Phosphatase: 82 IU/L (ref 44–121)
BUN/Creatinine Ratio: 22 (ref 10–24)
BUN: 43 mg/dL — ABNORMAL HIGH (ref 8–27)
Bilirubin Total: 0.8 mg/dL (ref 0.0–1.2)
CO2: 20 mmol/L (ref 20–29)
Calcium: 8 mg/dL — ABNORMAL LOW (ref 8.6–10.2)
Chloride: 104 mmol/L (ref 96–106)
Creatinine, Ser: 1.98 mg/dL — ABNORMAL HIGH (ref 0.76–1.27)
Globulin, Total: 1.7 g/dL (ref 1.5–4.5)
Glucose: 184 mg/dL — ABNORMAL HIGH (ref 70–99)
Potassium: 4.9 mmol/L (ref 3.5–5.2)
Sodium: 138 mmol/L (ref 134–144)
Total Protein: 5.3 g/dL — ABNORMAL LOW (ref 6.0–8.5)
eGFR: 35 mL/min/{1.73_m2} — ABNORMAL LOW (ref >59)

## 2021-12-30 LAB — CBC WITH DIFFERENTIAL/PLATELET
Basophils Absolute: 0 10*3/uL (ref 0.0–0.2)
Basos: 0 %
EOS (ABSOLUTE): 0 10*3/uL (ref 0.0–0.4)
Eos: 0 %
Hematocrit: 36.3 % — ABNORMAL LOW (ref 37.5–51.0)
Hemoglobin: 11.1 g/dL — ABNORMAL LOW (ref 13.0–17.7)
Lymphocytes Absolute: 0.3 10*3/uL — ABNORMAL LOW (ref 0.7–3.1)
Lymphs: 8 %
MCH: 27.8 pg (ref 26.6–33.0)
MCHC: 30.6 g/dL — ABNORMAL LOW (ref 31.5–35.7)
MCV: 91 fL (ref 79–97)
Monocytes Absolute: 0.2 10*3/uL (ref 0.1–0.9)
Monocytes: 6 %
Neutrophils Absolute: 2.9 10*3/uL (ref 1.4–7.0)
Neutrophils: 86 %
Platelets: 152 10*3/uL (ref 150–450)
RBC: 3.99 x10E6/uL — ABNORMAL LOW (ref 4.14–5.80)
RDW: 15.8 % — ABNORMAL HIGH (ref 11.6–15.4)
WBC: 3.4 10*3/uL (ref 3.4–10.8)

## 2021-12-30 LAB — BRAIN NATRIURETIC PEPTIDE: BNP: >5000 pg/mL — AB (ref 0.0–100.0)

## 2022-01-01 ENCOUNTER — Other Ambulatory Visit: Payer: Self-pay

## 2022-01-01 ENCOUNTER — Encounter (HOSPITAL_COMMUNITY): Payer: Self-pay | Admitting: Internal Medicine

## 2022-01-01 ENCOUNTER — Emergency Department (HOSPITAL_COMMUNITY): Payer: Medicare Other

## 2022-01-01 ENCOUNTER — Inpatient Hospital Stay (HOSPITAL_COMMUNITY)
Admission: EM | Admit: 2022-01-01 | Discharge: 2022-01-05 | DRG: 177 | Disposition: A | Discharge status: Home / Self Care | Payer: Medicare Other | Attending: Internal Medicine | Admitting: Internal Medicine

## 2022-01-01 DIAGNOSIS — Z85828 Personal history of other malignant neoplasm of skin: Secondary | ICD-10-CM

## 2022-01-01 DIAGNOSIS — E785 Hyperlipidemia, unspecified: Secondary | ICD-10-CM | POA: Diagnosis present

## 2022-01-01 DIAGNOSIS — F419 Anxiety disorder, unspecified: Secondary | ICD-10-CM | POA: Diagnosis present

## 2022-01-01 DIAGNOSIS — I255 Ischemic cardiomyopathy: Secondary | ICD-10-CM | POA: Diagnosis present

## 2022-01-01 DIAGNOSIS — Z515 Encounter for palliative care: Secondary | ICD-10-CM

## 2022-01-01 DIAGNOSIS — U071 COVID-19: Secondary | ICD-10-CM | POA: Diagnosis not present

## 2022-01-01 DIAGNOSIS — D61818 Other pancytopenia: Secondary | ICD-10-CM | POA: Diagnosis present

## 2022-01-01 DIAGNOSIS — I10 Essential (primary) hypertension: Secondary | ICD-10-CM | POA: Diagnosis present

## 2022-01-01 DIAGNOSIS — Z7960 Long term (current) use of unspecified immunomodulators and immunosuppressants: Secondary | ICD-10-CM

## 2022-01-01 DIAGNOSIS — N179 Acute kidney failure, unspecified: Secondary | ICD-10-CM | POA: Diagnosis not present

## 2022-01-01 DIAGNOSIS — Z66 Do not resuscitate: Secondary | ICD-10-CM | POA: Diagnosis present

## 2022-01-01 DIAGNOSIS — R0602 Shortness of breath: Secondary | ICD-10-CM | POA: Diagnosis not present

## 2022-01-01 DIAGNOSIS — R627 Adult failure to thrive: Secondary | ICD-10-CM | POA: Diagnosis present

## 2022-01-01 DIAGNOSIS — I130 Hypertensive heart and chronic kidney disease with heart failure and stage 1 through stage 4 chronic kidney disease, or unspecified chronic kidney disease: Secondary | ICD-10-CM | POA: Diagnosis present

## 2022-01-01 DIAGNOSIS — I2489 Other forms of acute ischemic heart disease: Secondary | ICD-10-CM | POA: Diagnosis present

## 2022-01-01 DIAGNOSIS — E119 Type 2 diabetes mellitus without complications: Secondary | ICD-10-CM

## 2022-01-01 DIAGNOSIS — Z7901 Long term (current) use of anticoagulants: Secondary | ICD-10-CM | POA: Diagnosis not present

## 2022-01-01 DIAGNOSIS — E1122 Type 2 diabetes mellitus with diabetic chronic kidney disease: Secondary | ICD-10-CM | POA: Diagnosis present

## 2022-01-01 DIAGNOSIS — D696 Thrombocytopenia, unspecified: Secondary | ICD-10-CM | POA: Diagnosis not present

## 2022-01-01 DIAGNOSIS — Z833 Family history of diabetes mellitus: Secondary | ICD-10-CM

## 2022-01-01 DIAGNOSIS — I2510 Atherosclerotic heart disease of native coronary artery without angina pectoris: Secondary | ICD-10-CM | POA: Diagnosis present

## 2022-01-01 DIAGNOSIS — I5043 Acute on chronic combined systolic (congestive) and diastolic (congestive) heart failure: Secondary | ICD-10-CM | POA: Diagnosis present

## 2022-01-01 DIAGNOSIS — R072 Precordial pain: Secondary | ICD-10-CM

## 2022-01-01 DIAGNOSIS — T8619 Other complication of kidney transplant: Secondary | ICD-10-CM | POA: Diagnosis present

## 2022-01-01 DIAGNOSIS — N184 Chronic kidney disease, stage 4 (severe): Secondary | ICD-10-CM | POA: Diagnosis present

## 2022-01-01 DIAGNOSIS — Z832 Family history of diseases of the blood and blood-forming organs and certain disorders involving the immune mechanism: Secondary | ICD-10-CM

## 2022-01-01 DIAGNOSIS — I459 Conduction disorder, unspecified: Secondary | ICD-10-CM | POA: Diagnosis present

## 2022-01-01 DIAGNOSIS — I4819 Other persistent atrial fibrillation: Secondary | ICD-10-CM | POA: Diagnosis present

## 2022-01-01 DIAGNOSIS — Z794 Long term (current) use of insulin: Secondary | ICD-10-CM

## 2022-01-01 DIAGNOSIS — Z789 Other specified health status: Secondary | ICD-10-CM | POA: Diagnosis not present

## 2022-01-01 DIAGNOSIS — E871 Hypo-osmolality and hyponatremia: Secondary | ICD-10-CM | POA: Diagnosis not present

## 2022-01-01 DIAGNOSIS — E875 Hyperkalemia: Secondary | ICD-10-CM | POA: Diagnosis present

## 2022-01-01 DIAGNOSIS — Z7189 Other specified counseling: Secondary | ICD-10-CM | POA: Diagnosis not present

## 2022-01-01 DIAGNOSIS — Z888 Allergy status to other drugs, medicaments and biological substances status: Secondary | ICD-10-CM

## 2022-01-01 DIAGNOSIS — D631 Anemia in chronic kidney disease: Secondary | ICD-10-CM | POA: Diagnosis present

## 2022-01-01 DIAGNOSIS — Z7952 Long term (current) use of systemic steroids: Secondary | ICD-10-CM

## 2022-01-01 DIAGNOSIS — Z951 Presence of aortocoronary bypass graft: Secondary | ICD-10-CM

## 2022-01-01 DIAGNOSIS — Z711 Person with feared health complaint in whom no diagnosis is made: Secondary | ICD-10-CM | POA: Diagnosis not present

## 2022-01-01 DIAGNOSIS — I5023 Acute on chronic systolic (congestive) heart failure: Principal | ICD-10-CM | POA: Diagnosis present

## 2022-01-01 DIAGNOSIS — Z882 Allergy status to sulfonamides status: Secondary | ICD-10-CM

## 2022-01-01 DIAGNOSIS — Z79899 Other long term (current) drug therapy: Secondary | ICD-10-CM

## 2022-01-01 DIAGNOSIS — Z940 Kidney transplant status: Secondary | ICD-10-CM

## 2022-01-01 DIAGNOSIS — I214 Non-ST elevation (NSTEMI) myocardial infarction: Secondary | ICD-10-CM | POA: Diagnosis present

## 2022-01-01 DIAGNOSIS — Y830 Surgical operation with transplant of whole organ as the cause of abnormal reaction of the patient, or of later complication, without mention of misadventure at the time of the procedure: Secondary | ICD-10-CM | POA: Diagnosis present

## 2022-01-01 DIAGNOSIS — Z8249 Family history of ischemic heart disease and other diseases of the circulatory system: Secondary | ICD-10-CM

## 2022-01-01 DIAGNOSIS — I252 Old myocardial infarction: Secondary | ICD-10-CM

## 2022-01-01 DIAGNOSIS — Z8673 Personal history of transient ischemic attack (TIA), and cerebral infarction without residual deficits: Secondary | ICD-10-CM

## 2022-01-01 LAB — BRAIN NATRIURETIC PEPTIDE: B Natriuretic Peptide: 3800.9 pg/mL — ABNORMAL HIGH (ref 0.0–100.0)

## 2022-01-01 LAB — CBC WITH DIFFERENTIAL/PLATELET
Abs Immature Granulocytes: 0 10*3/uL (ref 0.00–0.07)
Basophils Absolute: 0 10*3/uL (ref 0.0–0.1)
Basophils Relative: 0 %
Eosinophils Absolute: 0 10*3/uL (ref 0.0–0.5)
Eosinophils Relative: 0 %
HCT: 40.5 % (ref 39.0–52.0)
Hemoglobin: 12 g/dL — ABNORMAL LOW (ref 13.0–17.0)
Lymphocytes Relative: 8 %
Lymphs Abs: 0.4 10*3/uL — ABNORMAL LOW (ref 0.7–4.0)
MCH: 27.7 pg (ref 26.0–34.0)
MCHC: 29.6 g/dL — ABNORMAL LOW (ref 30.0–36.0)
MCV: 93.5 fL (ref 80.0–100.0)
Monocytes Absolute: 0.2 10*3/uL (ref 0.1–1.0)
Monocytes Relative: 5 %
Neutro Abs: 4.3 10*3/uL (ref 1.7–7.7)
Neutrophils Relative %: 87 %
Platelets: 112 10*3/uL — ABNORMAL LOW (ref 150–400)
RBC: 4.33 MIL/uL (ref 4.22–5.81)
RDW: 16.9 % — ABNORMAL HIGH (ref 11.5–15.5)
WBC: 4.9 10*3/uL (ref 4.0–10.5)
nRBC: 0 % (ref 0.0–0.2)
nRBC: 0 /100 WBC

## 2022-01-01 LAB — COMPREHENSIVE METABOLIC PANEL
ALT: 15 U/L (ref 0–44)
AST: 23 U/L (ref 15–41)
Albumin: 3.5 g/dL (ref 3.5–5.0)
Alkaline Phosphatase: 70 U/L (ref 38–126)
Anion gap: 15 (ref 5–15)
BUN: 48 mg/dL — ABNORMAL HIGH (ref 8–23)
CO2: 22 mmol/L (ref 22–32)
Calcium: 8.5 mg/dL — ABNORMAL LOW (ref 8.9–10.3)
Chloride: 100 mmol/L (ref 98–111)
Creatinine, Ser: 2.11 mg/dL — ABNORMAL HIGH (ref 0.61–1.24)
GFR, Estimated: 32 mL/min — ABNORMAL LOW (ref >60)
Glucose, Bld: 216 mg/dL — ABNORMAL HIGH (ref 70–99)
Potassium: 5.2 mmol/L — ABNORMAL HIGH (ref 3.5–5.1)
Sodium: 137 mmol/L (ref 135–145)
Total Bilirubin: 1.3 mg/dL — ABNORMAL HIGH (ref 0.3–1.2)
Total Protein: 6 g/dL — ABNORMAL LOW (ref 6.5–8.1)

## 2022-01-01 LAB — TROPONIN I (HIGH SENSITIVITY)
Troponin I (High Sensitivity): 104 ng/L (ref <18)
Troponin I (High Sensitivity): 217 ng/L (ref <18)

## 2022-01-01 LAB — RESP PANEL BY RT-PCR (FLU A&B, COVID) ARPGX2
Influenza A by PCR: NEGATIVE
Influenza B by PCR: NEGATIVE
SARS Coronavirus 2 by RT PCR: POSITIVE — AB

## 2022-01-01 LAB — I-STAT CHEM 8, ED
BUN: 44 mg/dL — ABNORMAL HIGH (ref 8–23)
Calcium, Ion: 1.07 mmol/L — ABNORMAL LOW (ref 1.15–1.40)
Chloride: 103 mmol/L (ref 98–111)
Creatinine, Ser: 2.2 mg/dL — ABNORMAL HIGH (ref 0.61–1.24)
Glucose, Bld: 217 mg/dL — ABNORMAL HIGH (ref 70–99)
HCT: 40 % (ref 39.0–52.0)
Hemoglobin: 13.6 g/dL (ref 13.0–17.0)
Potassium: 5.1 mmol/L (ref 3.5–5.1)
Sodium: 134 mmol/L — ABNORMAL LOW (ref 135–145)
TCO2: 21 mmol/L — ABNORMAL LOW (ref 22–32)

## 2022-01-01 LAB — PROTIME-INR
INR: 1.5 — ABNORMAL HIGH (ref 0.8–1.2)
Prothrombin Time: 17.6 seconds — ABNORMAL HIGH (ref 11.4–15.2)

## 2022-01-01 LAB — CBG MONITORING, ED: Glucose-Capillary: 206 mg/dL — ABNORMAL HIGH (ref 70–99)

## 2022-01-01 LAB — GLUCOSE, CAPILLARY
Glucose-Capillary: 232 mg/dL — ABNORMAL HIGH (ref 70–99)
Glucose-Capillary: 246 mg/dL — ABNORMAL HIGH (ref 70–99)

## 2022-01-01 LAB — MAGNESIUM: Magnesium: 2.1 mg/dL (ref 1.7–2.4)

## 2022-01-01 MED ORDER — NITROGLYCERIN 2 % TD OINT
1.0000 [in_us] | TOPICAL_OINTMENT | Freq: Once | TRANSDERMAL | Status: AC
  Administered 2022-01-01: 1 [in_us] via TOPICAL
  Filled 2022-01-01: qty 1

## 2022-01-01 MED ORDER — SPIRONOLACTONE 12.5 MG HALF TABLET
12.5000 mg | ORAL_TABLET | Freq: Every day | ORAL | Status: DC
  Filled 2022-01-01: qty 1

## 2022-01-01 MED ORDER — VERICIGUAT 2.5 MG PO TABS
2.5000 mg | ORAL_TABLET | Freq: Every day | ORAL | Status: DC
  Filled 2022-01-01 (×2): qty 1

## 2022-01-01 MED ORDER — ATORVASTATIN CALCIUM 40 MG PO TABS
40.0000 mg | ORAL_TABLET | Freq: Every day | ORAL | Status: DC
  Administered 2022-01-01 – 2022-01-05 (×5): 40 mg via ORAL
  Filled 2022-01-01 (×5): qty 1

## 2022-01-01 MED ORDER — PREDNISONE 5 MG PO TABS
5.0000 mg | ORAL_TABLET | Freq: Every day | ORAL | Status: DC
  Administered 2022-01-01 – 2022-01-05 (×5): 5 mg via ORAL
  Filled 2022-01-01 (×5): qty 1

## 2022-01-01 MED ORDER — FUROSEMIDE 20 MG PO TABS
40.0000 mg | ORAL_TABLET | Freq: Two times a day (BID) | ORAL | Status: DC
  Filled 2022-01-01: qty 2

## 2022-01-01 MED ORDER — APIXABAN 5 MG PO TABS
5.0000 mg | ORAL_TABLET | Freq: Two times a day (BID) | ORAL | Status: DC
  Administered 2022-01-01 – 2022-01-05 (×9): 5 mg via ORAL
  Filled 2022-01-01 (×9): qty 1

## 2022-01-01 MED ORDER — ALBUTEROL SULFATE (2.5 MG/3ML) 0.083% IN NEBU: 2.5000 mg | INHALATION_SOLUTION | Freq: Four times a day (QID) | RESPIRATORY_TRACT | Status: DC | PRN

## 2022-01-01 MED ORDER — ACETAMINOPHEN 325 MG PO TABS
650.0000 mg | ORAL_TABLET | Freq: Once | ORAL | Status: AC
  Administered 2022-01-01: 650 mg via ORAL
  Filled 2022-01-01: qty 2

## 2022-01-01 MED ORDER — SODIUM CHLORIDE 0.9% FLUSH
3.0000 mL | Freq: Two times a day (BID) | INTRAVENOUS | Status: DC
  Administered 2022-01-01 – 2022-01-04 (×8): 3 mL via INTRAVENOUS

## 2022-01-01 MED ORDER — INSULIN ASPART 100 UNIT/ML IJ SOLN
0.0000 [IU] | Freq: Three times a day (TID) | INTRAMUSCULAR | Status: DC
  Administered 2022-01-01 (×2): 5 [IU] via SUBCUTANEOUS
  Administered 2022-01-02 (×2): 3 [IU] via SUBCUTANEOUS
  Administered 2022-01-02: 5 [IU] via SUBCUTANEOUS
  Administered 2022-01-03: 3 [IU] via SUBCUTANEOUS
  Administered 2022-01-03: 2 [IU] via SUBCUTANEOUS
  Administered 2022-01-03 – 2022-01-04 (×3): 3 [IU] via SUBCUTANEOUS
  Administered 2022-01-04: 2 [IU] via SUBCUTANEOUS
  Administered 2022-01-05: 3 [IU] via SUBCUTANEOUS

## 2022-01-01 MED ORDER — FUROSEMIDE 10 MG/ML IJ SOLN
60.0000 mg | Freq: Once | INTRAMUSCULAR | Status: AC
  Administered 2022-01-01: 60 mg via INTRAVENOUS
  Filled 2022-01-01: qty 6

## 2022-01-01 MED ORDER — INSULIN NPH (HUMAN) (ISOPHANE) 100 UNIT/ML ~~LOC~~ SUSP
10.0000 [IU] | Freq: Two times a day (BID) | SUBCUTANEOUS | Status: DC
  Administered 2022-01-01 – 2022-01-05 (×9): 10 [IU] via SUBCUTANEOUS
  Filled 2022-01-01 (×3): qty 10

## 2022-01-01 MED ORDER — AMIODARONE HCL 100 MG PO TABS
100.0000 mg | ORAL_TABLET | Freq: Every day | ORAL | Status: DC
  Administered 2022-01-01 – 2022-01-05 (×5): 100 mg via ORAL
  Filled 2022-01-01 (×5): qty 1

## 2022-01-01 MED ORDER — METOPROLOL SUCCINATE ER 25 MG PO TB24
25.0000 mg | ORAL_TABLET | Freq: Every day | ORAL | Status: DC
  Administered 2022-01-01 – 2022-01-05 (×5): 25 mg via ORAL
  Filled 2022-01-01 (×5): qty 1

## 2022-01-01 MED ORDER — TACROLIMUS 1 MG PO CAPS
1.0000 mg | ORAL_CAPSULE | Freq: Two times a day (BID) | ORAL | Status: DC
  Administered 2022-01-01 – 2022-01-05 (×9): 1 mg via ORAL
  Filled 2022-01-01 (×10): qty 1

## 2022-01-01 MED ORDER — NITROGLYCERIN 0.4 MG SL SUBL: 0.4000 mg | SUBLINGUAL_TABLET | SUBLINGUAL | Status: DC | PRN

## 2022-01-01 MED ORDER — ACETAMINOPHEN 325 MG PO TABS
650.0000 mg | ORAL_TABLET | ORAL | Status: DC | PRN
  Administered 2022-01-03: 650 mg via ORAL
  Filled 2022-01-01: qty 2

## 2022-01-01 MED ORDER — MYCOPHENOLATE MOFETIL 250 MG PO CAPS
500.0000 mg | ORAL_CAPSULE | Freq: Two times a day (BID) | ORAL | Status: DC
  Administered 2022-01-01 – 2022-01-05 (×9): 500 mg via ORAL
  Filled 2022-01-01 (×10): qty 2

## 2022-01-01 MED ORDER — SODIUM CHLORIDE 0.9% FLUSH: 3.0000 mL | INTRAVENOUS | Status: DC | PRN

## 2022-01-01 MED ORDER — SODIUM CHLORIDE 0.9 % IV SOLN: 250.0000 mL | INTRAVENOUS | Status: DC | PRN

## 2022-01-01 MED ORDER — ALPRAZOLAM 0.25 MG PO TABS: 0.2500 mg | ORAL_TABLET | Freq: Every evening | ORAL | Status: DC | PRN

## 2022-01-01 MED ORDER — FUROSEMIDE 10 MG/ML IJ SOLN
80.0000 mg | Freq: Two times a day (BID) | INTRAMUSCULAR | Status: DC
  Administered 2022-01-01 – 2022-01-04 (×6): 80 mg via INTRAVENOUS
  Filled 2022-01-01 (×6): qty 8

## 2022-01-01 MED ORDER — MOLNUPIRAVIR EUA 200MG CAPSULE
4.0000 | ORAL_CAPSULE | Freq: Two times a day (BID) | ORAL | Status: DC
  Administered 2022-01-01 – 2022-01-05 (×8): 800 mg via ORAL
  Filled 2022-01-01 (×3): qty 4

## 2022-01-01 MED ORDER — ONDANSETRON HCL 4 MG/2ML IJ SOLN
4.0000 mg | Freq: Four times a day (QID) | INTRAMUSCULAR | Status: DC | PRN
  Administered 2022-01-04 (×2): 4 mg via INTRAVENOUS
  Filled 2022-01-01 (×2): qty 2

## 2022-01-01 MED ORDER — ASPIRIN 81 MG PO TBEC
81.0000 mg | DELAYED_RELEASE_TABLET | Freq: Every day | ORAL | Status: DC
  Administered 2022-01-02: 81 mg via ORAL
  Filled 2022-01-01: qty 1

## 2022-01-01 MED ORDER — EZETIMIBE 10 MG PO TABS
10.0000 mg | ORAL_TABLET | Freq: Every day | ORAL | Status: DC
  Administered 2022-01-01 – 2022-01-05 (×5): 10 mg via ORAL
  Filled 2022-01-01 (×5): qty 1

## 2022-01-01 NOTE — ED Notes (Signed)
ED TO INPATIENT HANDOFF REPORT  ED Nurse Name and Phone #: Iona Coach Name/Age/Gender Daniel Myers 76 y.o. male Room/Bed: 036C/036C  Code Status   Code Status: DNR  Home/SNF/Other Home Patient oriented to: self, place, time, and situation Is this baseline? Yes   Triage Complete: Triage complete  Chief Complaint Acute on chronic combined systolic (congestive) and diastolic (congestive) heart failure ([place]) [I50.43]  Triage Note Pt bib [facility] c/o of CP and SOB that began last night and worsened this morning. Hx NSTEMI, told he needed bypass surgery but what not a candidate due to kidney transplant. EKG showed L BBB. CP improved following 1 nitro + 324 ASA. Also given 4 zofran PTA. Crackles bilaterally lower fields.   BP 148/46 HR 105 RR 18 SPO2 91% RA, 95% 2LNC   Allergies Allergies  Allergen Reactions   Tape Rash    Use paper tape.    Trazodone And Nefazodone Palpitations    tachycardia   Mirtazapine     imbalance   Elemental Sulfur Rash   Sulfa Antibiotics Rash    Level of Care/Admitting Diagnosis ED Disposition     ED Disposition  Admit   Condition  --   Comment  Hospital Area: [facility] [[id number]]  Level of Care: Telemetry Cardiac [[id number]]  May admit patient to [facility] or [facility] if equivalent level of care is available:: No  Covid Evaluation: Asymptomatic - no recent exposure (last 10 days) testing not required  Diagnosis: Acute on chronic combined systolic (congestive) and diastolic (congestive) heart failure [facility]) [[id number]]  Admitting Physician: Karmen Bongo [[id number]]  Attending Physician: Karmen Bongo [[id number]]  Certification:: I certify this patient will need inpatient services for at least 2 midnights  Estimated Length of Stay: 3          B Medical/Surgery History 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   nodular on left jawline - excision   Basal cell carcinoma 07/01/2014    nodular on left hawling - CX3+5FU+excision   Basal cell carcinoma 10/10/2014   nodular on right forearm, middle - tx p bx   Basal cell carcinoma 02/11/2015   left neck - CX3 + excision   Basal cell carcinoma 06/14/2016   left jawline - CX3+5FU   Basal cell carcinoma 02/22/2017   superficial and nodular on left neck - excision   Basal cell carcinoma 04/11/2018   superficial and nodular on right inferior forearm - CX3+Cautery+5FU   Chronic kidney disease    History of renal transplant    Hyperlipidemia    Hypertension    NSTEMI (non-ST elevated myocardial infarction) ([place]) 02/13/2020   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   Left [place] (well diff)   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   Right Breast (in situ)   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   Left Forearm - anterior (mod diff)   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   Neck - anterior (mod diff)   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   in situ on left arm - CX3+5FU   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   hypertrophic on left ear - CX3+5FU   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   in situ on right temple - CX3+5FU   Squamous cell carcinoma of skin 11/08/2011   right upper forearm - tx p bx   Squamous cell carcinoma of skin 11/08/2011  left upper forearm - tx p bx   Squamous cell carcinoma of skin 11/08/2011   left hand - tx p bx   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/27/2013   in situ on left lower back - CX3+5FU   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/27/2013   in situ on left forehead - CX3+5FU   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/27/2013   in situ on right temple - watch per ST   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/27/2013   in situ on right forearm - CX3+5FU   Squamous cell carcinoma of skin 07/01/2014   well differentiated on right sideburn - CX3+5FU   Squamous cell carcinoma of skin 07/01/2014   in situ on left shoulder - CX3+5FU   Squamous  cell carcinoma of skin 07/01/2014   in situ on right forearm, proximal - CX3+5FU+Cautery   Squamous cell carcinoma of skin 07/01/2014   in situ on right forearm, distal - CX3+5FU   Squamous cell carcinoma of skin 09/30/2015   in situ on posterior left ear - CX3+5FU   Squamous cell carcinoma of skin 02/22/2017   in situ on right upper arm - CX3+5FU   Squamous cell carcinoma of skin 02/22/2017   in situ on left upper arm - CX3+5FU   Squamous cell carcinoma of skin 04/11/2018   in situ on right temple - CX3+5FU   Squamous cell carcinoma of skin 04/11/2018   in situ on lateral right arm - MOHs   Squamous cell carcinoma of skin 04/11/2018   in situ on right upper arm - MOHs   Squamous cell carcinoma of skin 04/11/2018   in situ on left sideburn   Squamous cell carcinoma of skin 04/11/2018   in situ on right flank - tx p bx   Squamous cell carcinoma of skin 09/28/2018   in situ on right inner ear - tx p bx   Squamous cell carcinoma of skin 09/28/2018   in situ on left inner ear - tx p bx   Squamous cell carcinoma of skin 09/28/2018   in situ on right arm - tx p bx   Squamous cell carcinoma of skin 03/21/2019   in situ on right antihelix ([place]kov treated topically)   Squamous cell carcinoma of skin 03/21/2019   in situ on right neck - CX3+5FU   Squamous cell carcinoma of skin 03/21/2019   in situ on left outer eye, inf (MOHs done 05/23/2019)   Type 2 diabetes mellitus ([place])    Unspecified atrial fibrillation ([place]) 02/17/2020   Past Surgical History:  Procedure Laterality Date   CARDIOVERSION N/A 10/16/2020   Procedure: CARDIOVERSION;  Surgeon: Larey Dresser, MD;  Location: [facility] ENDOSCOPY;  Service: Cardiovascular;  Laterality: N/A;   KIDNEY TRANSPLANT Right    RIGHT/LEFT HEART CATH AND CORONARY ANGIOGRAPHY N/A 09/09/2020   Procedure: RIGHT/LEFT HEART CATH AND CORONARY ANGIOGRAPHY;  Surgeon: Larey Dresser, MD;  Location: [place] CV LAB;  Service: Cardiovascular;  Laterality: N/A;      A IV Location/Drains/Wounds Patient Lines/Drains/Airways Status     Active Line/Drains/Airways     Name Placement date Placement time Site Days   Peripheral IV 01/01/22 18 G Anterior;Right Forearm 01/01/22  --  Forearm  less than 1            Intake/Output Last 24 hours  Intake/Output Summary (Last 24 hours) at 01/01/2022 1506 Last data filed at 01/01/2022 0839 Gross per 24 hour  Intake --  Output 200 ml  Net -200 ml    Labs/Imaging Results for orders placed or performed during the hospital encounter of 01/01/22 (from the past 48 hour(s))  Magnesium     Status: None   Collection Time: 01/01/22  5:47 AM  Result Value Ref Range   Magnesium 2.1 1.7 - 2.4 mg/dL    Comment: Performed at [facility], [place] [address] [place], [place] [address]  Brain natriuretic peptide (order ONLY if patient c/o SOB)     Status: Abnormal   Collection Time: 01/01/22  5:47 AM  Result Value Ref Range   B Natriuretic Peptide 3,800.9 (H) 0.0 - 100.0 pg/mL    Comment: Performed at [place] [address] [place], [place] [address]  Comprehensive metabolic panel     Status: Abnormal   Collection Time: 01/01/22  5:47 AM  Result Value Ref Range   Sodium 137 135 - 145 mmol/L   Potassium 5.2 (H) 3.5 - 5.1 mmol/L   Chloride 100 98 - 111 mmol/L   CO2 22 22 - 32 mmol/L   Glucose, Bld 216 (H) 70 - 99 mg/dL    Comment: Glucose reference range applies only to samples taken after fasting for at least 8 hours.   BUN 48 (H) 8 - 23 mg/dL   Creatinine, Ser 2.11 (H) 0.61 - 1.24 mg/dL   Calcium 8.5 (L) 8.9 - 10.3 mg/dL   Total Protein 6.0 (L) 6.5 - 8.1 g/dL   Albumin 3.5 3.5 - 5.0 g/dL   AST 23 15 - 41 U/L   ALT 15 0 - 44 U/L   Alkaline Phosphatase 70 38 - 126 U/L   Total Bilirubin 1.3 (H) 0.3 - 1.2 mg/dL   GFR, Estimated 32 (L) >60 mL/min    Comment: (NOTE) Calculated using the CKD-EPI Creatinine Equation (2021)    Anion gap 15 5 - 15    Comment: Performed at [facility], [place] [address], [place], [place] [address]  CBC with Differential/Platelet     Status: Abnormal   Collection Time: 01/01/22  5:47 AM  Result Value Ref Range   WBC 4.9 4.0 - 10.5 K/uL   RBC 4.33 4.22 - 5.81 MIL/uL   Hemoglobin 12.0 (L) 13.0 - 17.0 g/dL   HCT 40.5 39.0 - 52.0 %   MCV 93.5 80.0 - 100.0 fL   MCH 27.7 26.0 - 34.0 pg   MCHC 29.6 (L) 30.0 - 36.0 g/dL   RDW 16.9 (H) 11.5 - 15.5 %   Platelets 112 (L) 150 - 400 K/uL   nRBC 0.0 0.0 - 0.2 %   Neutrophils Relative % 87 %   Neutro Abs 4.3 1.7 - 7.7 K/uL   Lymphocytes Relative 8 %   Lymphs Abs 0.4 (L) 0.7 - 4.0 K/uL   Monocytes Relative 5 %   Monocytes Absolute 0.2 0.1 - 1.0 K/uL   Eosinophils Relative 0 %   Eosinophils Absolute 0.0 0.0 - 0.5 K/uL   Basophils Relative 0 %   Basophils Absolute 0.0 0.0 - 0.1 K/uL   nRBC 0 0 /100 WBC   Abs Immature Granulocytes 0.00 0.00 - 0.07 K/uL    Comment: Performed at [facility], [place] [address], [place], [place] [address]  Protime-INR     Status: Abnormal   Collection Time: 01/01/22  5:47 AM  Result Value Ref Range   Prothrombin Time 17.6 (H) 11.4 - 15.2 seconds   INR 1.5 (H) 0.8 - 1.2  Comment: (NOTE) INR goal varies based on device and disease states. Performed at [facility], [place] [address] [place], [place] [address]   Troponin I (High Sensitivity)     Status: Abnormal   Collection Time: 01/01/22  5:47 AM  Result Value Ref Range   Troponin I (High Sensitivity) 104 (HH) <18 ng/L    Comment: CRITICAL RESULT CALLED TO, READ BACK BY AND VERIFIED WITH Kelby Aline THOMPSON RN 01/01/22 0647 M KOROLESKI (NOTE) Elevated high sensitivity troponin I (hsTnI) values and significant  changes across serial measurements may suggest ACS but many other  chronic and acute conditions are known to elevate hsTnI results.  Refer to the "Links" section for chest pain algorithms and additional  guidance. Performed at [facility], [place] [address], [place],  [place] [address]   I-Stat Chem 8, ED     Status: Abnormal   Collection Time: 01/01/22  5:55 AM  Result Value Ref Range   Sodium 134 (L) 135 - 145 mmol/L   Potassium 5.1 3.5 - 5.1 mmol/L   Chloride 103 98 - 111 mmol/L   BUN 44 (H) 8 - 23 mg/dL   Creatinine, Ser 2.20 (H) 0.61 - 1.24 mg/dL   Glucose, Bld 217 (H) 70 - 99 mg/dL    Comment: Glucose reference range applies only to samples taken after fasting for at least 8 hours.   Calcium, Ion 1.07 (L) 1.15 - 1.40 mmol/L   TCO2 21 (L) 22 - 32 mmol/L   Hemoglobin 13.6 13.0 - 17.0 g/dL   HCT 40.0 39.0 - 52.0 %  Troponin I (High Sensitivity)     Status: Abnormal   Collection Time: 01/01/22  8:01 AM  Result Value Ref Range   Troponin I (High Sensitivity) 217 (HH) <18 ng/L    Comment: CRITICAL VALUE NOTED. VALUE IS CONSISTENT WITH PREVIOUSLY REPORTED/CALLED VALUE (NOTE) Elevated high sensitivity troponin I (hsTnI) values and significant  changes across serial measurements may suggest ACS but many other  chronic and acute conditions are known to elevate hsTnI results.  Refer to the "Links" section for chest pain algorithms and additional  guidance. Performed at [facility], [place] [address], [place], [place] [address]   Resp Panel by RT-PCR (Flu A&B, Covid) Anterior Nasal Swab     Status: Abnormal   Collection Time: 01/01/22  8:01 AM   Specimen: Anterior Nasal Swab  Result Value Ref Range   SARS Coronavirus 2 by RT PCR POSITIVE (A) NEGATIVE    Comment: (NOTE) SARS-CoV-2 target nucleic acids are DETECTED.  The SARS-CoV-2 RNA is generally detectable in upper respiratory specimens during the acute phase of infection. Positive results are indicative of the presence of the identified virus, but do not rule out bacterial infection or co-infection with other pathogens not detected by the test. Clinical correlation with patient history and other diagnostic information is necessary to determine patient infection status. The expected result is  Negative.  Fact Sheet for Patients: EntrepreneurPulse.com.au  Fact Sheet for Healthcare Providers: IncredibleEmployment.be  This test is not yet approved or cleared by the [place] FDA and  has been authorized for detection and/or diagnosis of SARS-CoV-2 by FDA under an Emergency Use Authorization (EUA).  This EUA will remain in effect (meaning this test can be used) for the duration of  the COVID-19 declaration under Section 564(b)(1) of the A ct, 21 U.S.C. section 360bbb-3(b)(1), unless the authorization is terminated or revoked sooner.     Influenza A by PCR  NEGATIVE NEGATIVE   Influenza B by PCR NEGATIVE NEGATIVE    Comment: (NOTE) The Xpert Xpress SARS-CoV-2/FLU/RSV plus assay is intended as an aid in the diagnosis of influenza from Nasopharyngeal swab specimens and should not be used as a sole basis for treatment. Nasal washings and aspirates are unacceptable for Xpert Xpress SARS-CoV-2/FLU/RSV testing.  Fact Sheet for Patients: EntrepreneurPulse.com.au  Fact Sheet for Healthcare Providers: IncredibleEmployment.be  This test is not yet approved or cleared by the [place] FDA and has been authorized for detection and/or diagnosis of SARS-CoV-2 by FDA under an Emergency Use Authorization (EUA). This EUA will remain in effect (meaning this test can be used) for the duration of the COVID-19 declaration under Section 564(b)(1) of the Act, 21 U.S.C. section 360bbb-3(b)(1), unless the authorization is terminated or revoked.  Performed at [facility], [place] [address] [place], [place] [address]   CBG monitoring, ED     Status: Abnormal   Collection Time: 01/01/22 11:55 AM  Result Value Ref Range   Glucose-Capillary 206 (H) 70 - 99 mg/dL    Comment: Glucose reference range applies only to samples taken after fasting for at least 8 hours.   DG Chest Portable 1 View  Result Date:  01/01/2022 CLINICAL DATA:  Shortness of breath.  Chest pain. EXAM: PORTABLE CHEST 1 VIEW COMPARISON:  12/14/2021 FINDINGS: Cardiac enlargement. There is diffuse increase interstitial markings identified bilaterally concerning for pulmonary edema. No airspace opacity scratch set no airspace consolidation. No signs of pleural effusion. IMPRESSION: Cardiac enlargement and pulmonary edema. Correlate for any signs or symptoms of CHF. Electronically Signed   By: Kerby Moors M.D.   On: 01/01/2022 06:41    Pending Labs Unresulted Labs (From admission, onward)     Start     Ordered   01/02/22 7106  Basic metabolic panel  Tomorrow morning,   R        01/01/22 0854   01/02/22 0500  CBC  Tomorrow morning,   R        01/01/22 0854            Vitals/Pain Today's Vitals   01/01/22 1215 01/01/22 1230 01/01/22 1315 01/01/22 1355  BP: (!) 103/53 (!) 105/58 (!) 100/53   Pulse: 72 70 69   Resp: (!) 27 (!) 23 (!) 23   Temp:    98.8 F (37.1 C)  TempSrc:    Oral  SpO2: 98% 96% 98%   Weight:      Height:      PainSc:        Isolation Precautions No active isolations  Medications Medications  amiodarone (PACERONE) tablet 100 mg (100 mg Oral Given 01/01/22 1103)  atorvastatin (LIPITOR) tablet 40 mg (40 mg Oral Given 01/01/22 1102)  ezetimibe (ZETIA) tablet 10 mg (10 mg Oral Given 01/01/22 1102)  metoprolol succinate (TOPROL-XL) 24 hr tablet 25 mg (25 mg Oral Given 01/01/22 1102)  Vericiguat TABS 2.5 mg (2.5 mg Oral Not Given 01/01/22 1109)  ALPRAZolam (XANAX) tablet 0.25 mg (has no administration in time range)  insulin NPH Human (NOVOLIN N) injection 10 Units (10 Units Subcutaneous Given 01/01/22 1106)  predniSONE (DELTASONE) tablet 5 mg (5 mg Oral Given 01/01/22 1102)  apixaban (ELIQUIS) tablet 5 mg (5 mg Oral Given 01/01/22 1102)  mycophenolate (CELLCEPT) capsule 500 mg (500 mg Oral Given 01/01/22 1104)  tacrolimus (PROGRAF) capsule 1 mg (1 mg Oral Given 01/01/22 1103)  albuterol  (PROVENTIL) (2.5 MG/3ML) 0.083% nebulizer solution 2.5 mg (has  no administration in time range)  sodium chloride flush (NS) 0.9 % injection 3 mL (3 mLs Intravenous Given 01/01/22 1108)  sodium chloride flush (NS) 0.9 % injection 3 mL (has no administration in time range)  0.9 %  sodium chloride infusion (has no administration in time range)  aspirin EC tablet 81 mg (has no administration in time range)  nitroGLYCERIN (NITROSTAT) SL tablet 0.4 mg (has no administration in time range)  acetaminophen (TYLENOL) tablet 650 mg (has no administration in time range)  ondansetron (ZOFRAN) injection 4 mg (has no administration in time range)  insulin aspart (novoLOG) injection 0-15 Units (5 Units Subcutaneous Given 01/01/22 1257)  furosemide (LASIX) injection 80 mg (has no administration in time range)  nitroGLYCERIN (NITROGLYN) 2 % ointment 1 inch (1 inch Topical Given 01/01/22 0612)  furosemide (LASIX) injection 60 mg (60 mg Intravenous Given 01/01/22 0704)  acetaminophen (TYLENOL) tablet 650 mg (650 mg Oral Given 01/01/22 0759)    Mobility walks with device High fall risk   Focused Assessments Cardiac Assessment Handoff:  Cardiac Rhythm: Normal sinus rhythm No results found for: "CKTOTAL", "CKMB", "CKMBINDEX", "TROPONINI" Lab Results  Component Value Date   DDIMER 1.13 (H) 02/17/2020   Does the Patient currently have chest pain? No    R Recommendations: See Admitting Provider Note  Report given to:   Additional Notes:

## 2022-01-01 NOTE — ED Provider Notes (Signed)
[facility] EMERGENCY DEPARTMENT Provider Note  CSN: [id number] Arrival date & time: 01/01/22 0535  Chief Complaint(s) Chest Pain and Shortness of Breath  HPI Daniel Myers is a 76 y.o. 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  Initially improved with nitroglycerin but appears to be returning.  Patient reports that he was in his normal state of health, able to ambulate without significant difficulty or shortness of breath around his house up until last night.  He denies any recent fevers or congestion.  No coughing.  No known/worsening lower extremity edema.  Reports that he is compliant with all his medications.  The history is provided by the patient and the EMS personnel.    Past Medical History 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   nodular on left jawline - excision   Basal cell carcinoma 07/01/2014   nodular on left hawling - CX3+5FU+excision   Basal cell carcinoma 10/10/2014   nodular on right forearm, middle - tx p bx   Basal cell carcinoma 02/11/2015   left neck - CX3 + excision   Basal cell carcinoma 06/14/2016   left jawline - CX3+5FU   Basal cell carcinoma 02/22/2017   superficial and nodular on left neck - excision   Basal cell carcinoma 04/11/2018   superficial and nodular on right inferior forearm - CX3+Cautery+5FU   Chronic kidney disease    History of renal transplant    Hyperlipidemia    Hypertension    NSTEMI (non-ST elevated myocardial infarction) ([place]) 02/13/2020   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   Left [place] (well diff)   SCCA (squamous cell  carcinoma) of skin 08/17/2021   Right Breast (in situ)   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   Left Forearm - anterior (mod diff)   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   Neck - anterior (mod diff)   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   in situ on left arm - CX3+5FU   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   hypertrophic on left ear - CX3+5FU   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   in situ on right temple - CX3+5FU   Squamous cell carcinoma of skin 11/08/2011   right upper forearm - tx p bx   Squamous cell carcinoma of skin 11/08/2011   left upper forearm - tx p bx   Squamous cell carcinoma of skin 11/08/2011   left hand - tx p bx   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/27/2013   in situ on left lower back - CX3+5FU   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/27/2013   in situ on left forehead - CX3+5FU   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/27/2013   in situ on right temple - watch per ST   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/27/2013   in situ on right forearm - CX3+5FU   Squamous cell carcinoma of skin 07/01/2014   well differentiated on right sideburn - CX3+5FU   Squamous cell carcinoma of skin 07/01/2014   in situ on left shoulder - CX3+5FU   Squamous cell carcinoma of skin  07/01/2014   in situ on right forearm, proximal - CX3+5FU+Cautery   Squamous cell carcinoma of skin 07/01/2014   in situ on right forearm, distal - CX3+5FU   Squamous cell carcinoma of skin 09/30/2015   in situ on posterior left ear - CX3+5FU   Squamous cell carcinoma of skin 02/22/2017   in situ on right upper arm - CX3+5FU   Squamous cell carcinoma of skin 02/22/2017   in situ on left upper arm - CX3+5FU   Squamous cell carcinoma of skin 04/11/2018   in situ on right temple - CX3+5FU   Squamous cell carcinoma of skin 04/11/2018   in situ on lateral right arm - MOHs   Squamous cell carcinoma of skin 04/11/2018   in situ on right upper arm - MOHs   Squamous cell  carcinoma of skin 04/11/2018   in situ on left sideburn   Squamous cell carcinoma of skin 04/11/2018   in situ on right flank - tx p bx   Squamous cell carcinoma of skin 09/28/2018   in situ on right inner ear - tx p bx   Squamous cell carcinoma of skin 09/28/2018   in situ on left inner ear - tx p bx   Squamous cell carcinoma of skin 09/28/2018   in situ on right arm - tx p bx   Squamous cell carcinoma of skin 03/21/2019   in situ on right antihelix ([place] treated topically)   Squamous cell carcinoma of skin 03/21/2019   in situ on right neck - CX3+5FU   Squamous cell carcinoma of skin 03/21/2019   in situ on left outer eye, inf (MOHs done 05/23/2019)   Type 2 diabetes mellitus ([place])    Unspecified atrial fibrillation ([place]) 02/17/2020   Patient Active Problem List   Diagnosis Date Noted   Paroxysmal A-fib ([place]) 12/17/2021   Acute on chronic congestive heart failure ([place]) 12/15/2021   Weakness 12/14/2021   CAD (coronary artery disease)/Prior MI 11/17/2021   Obesity (BMI 30-39.9) 11/17/2021   NSTEMI (non-ST elevated myocardial infarction) ([place]) 11/16/2021   Hyperkalemia 11/16/2021   Goals of care, counseling/discussion 11/16/2021   DNR (do not resuscitate) 11/16/2021   Cerebrovascular accident (CVA) ([place]) 12/19/2020   Persistent atrial fibrillation ([place]) 03/11/2020   Acute on chronic combined systolic and diastolic CHF (congestive heart failure) ([place]) 02/13/2020   Thrombocytopenia ([place]) 02/13/2020   CKD (chronic kidney disease) stage 4, GFR 15-29 ml/min (HCC) 02/12/2020   Immunosuppression ([place]) 01/22/2020   Proteinuria 01/22/2020   Essential hypertension 11/22/2018   Insulin dependent type 2 diabetes mellitus ([place]) 11/22/2018   Renal transplant recipient 11/22/2018   Hyperlipidemia 11/22/2018   Vision decreased 11/22/2018   Hyponatremia 11/22/2018   Seasonal allergic rhinitis due to pollen 11/22/2018   Diabetic nephropathy associated with type 2 diabetes mellitus ([place])  05/04/2016   Uncontrolled type 2 diabetes mellitus with hyperglycemia ([place]) 10/29/2015   Immunosuppressive management encounter following kidney transplant 10/07/2015   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 Authorizing Provider  acetaminophen (TYLENOL) 500 MG tablet Take 500 mg by mouth every 8 (eight) hours as needed for moderate pain.    [provider]  albuterol (VENTOLIN HFA) 108 (90 Base) MCG/ACT inhaler Inhale 2 puffs into the lungs every 6 (six) hours as needed for wheezing or shortness of breath. 11/13/21   Stacks,  [name], MD  ALPRAZolam Duanne Moron) 0.25 MG tablet Take 1 tablet (0.25 mg total) by mouth at bedtime as needed for anxiety. 12/28/21   Claretta Fraise, MD  amiodarone (PACERONE) 200 MG tablet Take 0.5 tablets (100 mg total) by mouth daily. 10/14/21   Larey Dresser, MD  atorvastatin (LIPITOR) 40 MG tablet Take 1 tablet (40 mg total) by mouth daily. 11/10/21   Claretta Fraise, MD  ELIQUIS 5 MG TABS tablet Take 1 tablet by mouth twice daily 11/09/21   Larey Dresser, MD  ezetimibe (ZETIA) 10 MG tablet Take 1 tablet (10 mg total) by mouth daily. 08/17/21   Satira Sark, MD  fluticasone (FLONASE) 50 MCG/ACT nasal spray Place 1 spray into both nostrils daily as needed for allergies. 12/21/21   Johnson, Clanford L, MD  furosemide (LASIX) 40 MG tablet Take 1 tablet (40 mg total) by mouth 2 (two) times daily. 12/21/21   Johnson, Clanford L, MD  insulin NPH Human (NOVOLIN N) 100 UNIT/ML injection 10 units AC breakfast and 10 units AC supper 08/05/21   Claretta Fraise, MD  insulin regular (NOVOLIN R) 100 units/mL injection 16 units w bkfst and 13 units w dinner 04/30/21   Claretta Fraise, MD  loratadine (CLARITIN) 10 MG tablet Take 10 mg by mouth daily as needed for allergies. 10/24/06   [provider]  metoprolol succinate (TOPROL-XL) 25 MG 24 hr tablet Take 1 tablet (25 mg total) by mouth daily. Take with or immediately following a meal. 12/21/21   Johnson, Clanford L, MD  mycophenolate (CELLCEPT) 250 MG capsule Take 500 mg by mouth 2 (two) times daily. 06/10/14   [provider]  nitroGLYCERIN (NITROSTAT) 0.4 MG SL tablet Place 1 tablet (0.4 mg total) under the tongue every 5 (five) minutes as needed for chest pain. 11/05/20   Larey Dresser, MD  ondansetron (ZOFRAN) 4 MG tablet Take 1 tablet (4 mg total) by mouth every 8 (eight) hours as needed for nausea or vomiting. 04/17/20   Claretta Fraise, MD  predniSONE (DELTASONE) 5 MG tablet Take 5 mg by mouth daily with breakfast.    [provider]  sodium zirconium cyclosilicate (LOKELMA) 10 g PACK packet Take 10 g by mouth as directed. By the HF clinic just as needed 12/21/21   Murlean Iba, MD  spironolactone (ALDACTONE) 25 MG tablet Take 0.5 tablets (12.5 mg total) by mouth daily. 12/21/21   Johnson, Clanford L, MD  tacrolimus (PROGRAF) 0.5 MG capsule Take 1 mg by mouth in the morning and at bedtime. 07/08/15   [provider]  Vericiguat (VERQUVO) 2.5 MG TABS Take 2.5 mg by mouth daily. 12/01/21   Larey Dresser, MD                                                                                                                                    Allergies Tape, Trazodone and nefazodone, Mirtazapine, Elemental  sulfur, and Sulfa antibiotics  Review of Systems Review of Systems As noted in HPI  Physical Exam Vital Signs  I have reviewed the triage vital signs BP (!) 141/83   Pulse (!) 107   Temp 99.4 F (37.4 C) (Oral)   Resp 17   Ht '5\' 10"'$  (1.778 m)   Wt 85.7 kg   SpO2 96%   BMI 27.12 kg/m   Physical Exam Vitals reviewed.  Constitutional:      General: He is not in acute distress.    Appearance: He is well-developed. He is not diaphoretic.  HENT:     Head: Normocephalic and  atraumatic.     Nose: Nose normal.  Eyes:     General: No scleral icterus.       Right eye: No discharge.        Left eye: No discharge.     Conjunctiva/sclera: Conjunctivae normal.     Pupils: Pupils are equal, round, and reactive to light.  Cardiovascular:     Rate and Rhythm: Regular rhythm.     Heart sounds: No murmur heard.    No friction rub. No gallop.  Pulmonary:     Effort: Pulmonary effort is normal. Tachypnea present. No respiratory distress.     Breath sounds: No stridor. Examination of the right-middle field reveals rales. Examination of the left-middle field reveals rales. Examination of the right-lower field reveals rales. Examination of the left-lower field reveals rales. Rales present. No rhonchi.  Abdominal:     General: There is no distension.     Palpations: Abdomen is soft.     Tenderness: There is no abdominal tenderness.  Musculoskeletal:        General: No tenderness.     Cervical back: Normal range of motion and neck supple.  Skin:    General: Skin is warm and dry.     Findings: No erythema or rash.  Neurological:     Mental Status: He is alert and oriented to person, place, and time.     ED Results and Treatments Labs (all labs ordered are listed, but only abnormal results are displayed) Labs Reviewed  COMPREHENSIVE METABOLIC PANEL - Abnormal; Notable for the following components:      Result Value   Potassium 5.2 (*)    Glucose, Bld 216 (*)    BUN 48 (*)    Creatinine, Ser 2.11 (*)    Calcium 8.5 (*)    Total Protein 6.0 (*)    Total Bilirubin 1.3 (*)    GFR, Estimated 32 (*)    All other components within normal limits  CBC WITH DIFFERENTIAL/PLATELET - Abnormal; Notable for the following components:   Hemoglobin 12.0 (*)    MCHC 29.6 (*)    RDW 16.9 (*)    Platelets 112 (*)    Lymphs Abs 0.4 (*)    All other components within normal limits  PROTIME-INR - Abnormal; Notable for the following components:   Prothrombin Time 17.6 (*)    INR  1.5 (*)    All other components within normal limits  I-STAT CHEM 8, ED - Abnormal; Notable for the following components:   Sodium 134 (*)    BUN 44 (*)    Creatinine, Ser 2.20 (*)    Glucose, Bld 217 (*)    Calcium, Ion 1.07 (*)    TCO2 21 (*)    All other components within normal limits  TROPONIN I (HIGH SENSITIVITY) - Abnormal; Notable for the following components:  Troponin I (High Sensitivity) 104 (*)    All other components within normal limits  MAGNESIUM  BRAIN NATRIURETIC PEPTIDE                                                                                                                         EKG  EKG Interpretation  Date/Time:  Friday January 01 2022 06:24:39 EST Ventricular Rate:  105 PR Interval:  168 QRS Duration: 126 QT Interval:  356 QTC Calculation: 471 R Axis:   110 Text Interpretation: Sinus rhythm Nonspecific intraventricular conduction delay Abnormal lateral Q waves Probable anteroseptal infarct, recent Confirmed by Addison Lank [phone]) on 01/01/2022 6:39:12 AM       Radiology DG Chest Portable 1 View  Result Date: 01/01/2022 CLINICAL DATA:  Shortness of breath.  Chest pain. EXAM: PORTABLE CHEST 1 VIEW COMPARISON:  12/14/2021 FINDINGS: Cardiac enlargement. There is diffuse increase interstitial markings identified bilaterally concerning for pulmonary edema. No airspace opacity scratch set no airspace consolidation. No signs of pleural effusion. IMPRESSION: Cardiac enlargement and pulmonary edema. Correlate for any signs or symptoms of CHF. Electronically Signed   By: Kerby Moors M.D.   On: 01/01/2022 06:41    Medications Ordered in ED Medications  furosemide (LASIX) injection 60 mg (has no administration in time range)  nitroGLYCERIN (NITROGLYN) 2 % ointment 1 inch (1 inch Topical Given 01/01/22 0612)                                                                                                                                      Procedures .Critical Care  Performed by: Fatima Blank, MD Authorized by: Fatima Blank, MD   Critical care provider statement:    Critical care time (minutes):  45   Critical care time was exclusive of:  Separately billable procedures and treating other patients   Critical care was necessary to treat or prevent imminent or life-threatening deterioration of the following conditions:  Cardiac failure   Critical care was time spent personally by me on the following activities:  Development of treatment plan with patient or surrogate, discussions with consultants, evaluation of patient's response to treatment, examination of patient, obtaining history from patient or surrogate, review of old charts, re-evaluation of patient's condition, pulse oximetry, ordering and review of radiographic studies, ordering and review of laboratory studies and ordering and performing treatments and interventions   Care discussed  with: admitting provider     (including critical care time)  Medical Decision Making / ED Course   Medical Decision Making Amount and/or Complexity of Data Reviewed External Data Reviewed: radiology.    Details: ECHO mentioned in HPI Recent admission note where he had 23lbs weight loss after diuresing. Cardiology noted mentioned above. Labs: ordered. Decision-making details documented in ED Course. Radiology: ordered and independent interpretation performed. Decision-making details documented in ED Course. ECG/medicine tests: ordered and independent interpretation performed. Decision-making details documented in ED Course.  Risk Prescription drug management. Decision regarding hospitalization.  Chest pain and shortness of breath improved with nitroglycerin Lungs with bibasilar Rales concerning for CHF exacerbation. With known multivessel disease, will also need to trend troponin to assess for ACS. We will also evaluate for pneumonia, pneumothorax, pleural  effusions. Presentation is less concerning for PE at this time.  EKG with new anterior changes.  Consistent with his known LAD disease. CBC without leukocytosis or anemia CMP with mild hyperkalemia.  Hyperglycemia without evidence of DKA.  Stable renal insufficiency and close to his baseline. Initial troponin at 104 which is lower than his recent troponins few weeks ago. BNP currently pending  Chest x-ray notable for pulmonary edema consistent with CHF exacerbation Nitroglycerin paste placed and patient given IV Lasix He is currently chest pain-free and satting well on 2 L nasal cannula  We will admit patient to medicine for further work-up and management.     Final Clinical Impression(s) / ED Diagnoses Final diagnoses:  Acute on chronic systolic CHF (congestive heart failure) (HCC)  Precordial pain           This chart was dictated using voice recognition software.  Despite best efforts to proofread,  errors can occur which can change the documentation meaning.    Fatima Blank, MD 01/01/22 0700

## 2022-01-01 NOTE — Consult Note (Addendum)
Consultation Note Date: 01/01/2022   Patient Name: Daniel Myers  DOB: 08/02/1945  MRN: [id number]  Age / Sex: 76 y.o., male  PCP: Claretta Fraise, MD Referring Physician: Karmen Bongo, MD  Reason for Consultation: Establishing goals of care, "home hospice"  HPI/Patient Profile: 76 y.o. male  with past medical history of chronic combined CHF (declined AICD), CKD IV s/p renal transplant, HTN, HLD, CAD (declined CABG), stage 3b CKD, DM, and afib on Eliquis presented to the ED on 01/01/22 from home with complaints of chest pain and shortness of breath. Patient was admitted on 01/01/2022 with acute on chronic combined systolic and diastolic heart failure, ischemic cardiomyopathy, possible NSTEMI, COVID+.  Of note, patient has been hospitalized 3 times in the last 6 months and is a 30 day readmission. Most recent hospitalization was 11/6-11/13/23 for treatment of heart failure.   Clinical Assessment and Goals of Care: I have reviewed medical records including EPIC notes, labs, and imaging. Received report from primary RN - no acute concerns.   Went to visit patient at bedside - daughter/[name] present. Patient was sitting on edge of bed eating lunch. He is awake, alert, oriented, and able to participate in conversation. Patient has mild conversational dyspnea. No signs or non-verbal gestures of pain or discomfort noted. No respiratory distress, increased work of breathing, or secretions noted. He is on oxygen. Patient reports "feeling good today."  Met with patient and his daughter  to discuss diagnosis, prognosis, GOC, EOL wishes, disposition, and options.  I introduced Palliative Medicine as specialized medical care for people living with serious illness. It focuses on providing relief from the symptoms and stress of a serious illness. The goal is to improve quality of life for both the patient and the family.  We  discussed a brief life review of the patient as well as functional and nutritional status. Patient is widowed, he has two daughters and one son. Prior to hospitalization, he was living in a private residence alone; however, his daughter/[name] lives across the street and checks on him often. At home, he ambulates with a walker, is able to dress/bathe himself. Currently, he has a nurse visit 1x/week and PT 2x/week. He has a good appetite.   We discussed patient's current illness and what it means in the larger context of patient's on-going co-morbidities. Education provided that CHF and CKD are progressive, non-curable diseases underlying the patient's current acute medical conditions. Patient and Daniel Myers have a clear understanding of his current acute medical situation. Natural disease trajectory and expectations at EOL were discussed. I attempted to elicit values and goals of care important to the patient. The difference between aggressive medical intervention and comfort care was considered in light of the patient's goals of care. Patient is clear he is not interested in invasive procedures.  Provided education and counseling at length on the philosophy and benefits of hospice care. Discussed that it offers a holistic approach to care in the setting of end-stage illness, and is about supporting the patient where they are allowing nature to take it's course. Discussed the hospice team includes RNs, physicians, social workers, and chaplains. They can provide personal care, support for the family, and help keep patient out of the hospital as well as assist with DME needs for home hospice. Education provided on the difference between home vs residential hospice. Patient does feel this would provide support he would need as well as meet his goals, but he requests additional time to process information from today prior to  making final decisions.  We talked about transition to comfort measures in house and what that  would entail inclusive of medications to control pain, dyspnea, agitation, nausea, and itching. We discussed stopping all unnecessary measures such as blood draws, needle sticks, oxygen, antibiotics, CBGs/insulin, cardiac monitoring, IVF, and frequent vital signs. Education provided that other non-pharmacological interventions would be utilized for holistic support and comfort such as spiritual support if requested, repositioning, music therapy, offering comfort feeds, and/or therapeutic listening.  He would like to continue to be medically optimized while considering hospice option. He is agreeable for PMT to follow up tomorrow.  Advance directives and rehospitalization were considered and discussed. Patient does not have a Living Will or HCPOA. He would want his daughter Daniel Myers to be surrogate decision maker if ever needed.   Discussed with patient/family the importance of continued conversation with each other and the medical providers regarding overall plan of care and treatment options, ensuring decisions are within the context of the patient's values and GOCs.    Questions and concerns were addressed. The patient/family was encouraged to call with questions and/or concerns. PMT card was provided.   Primary Decision Maker: PATIENT    SUMMARY OF RECOMMENDATIONS   Continue to medically optimize Continue DNR/DNI as previously documented Patient is open to home hospice; however, requests time to process information given to him today prior to making final decisions No invasive procedures Daughter/[name] [name] to be surrogate decision maker if ever needed PMT will continue to follow and support holistically   Code Status/Advance Care Planning: DNR  Palliative Prophylaxis:  Aspiration, Frequent Pain Assessment, and Oral Care  Additional Recommendations (Limitations, Scope, Preferences): Full Scope Treatment, No Surgical Procedures, and No Tracheostomy  Psycho-social/Spiritual:  Desire  for further Chaplaincy support:no Created space and opportunity for patient and family to express thoughts and feelings regarding patient's current medical situation.  Emotional support and therapeutic listening provided.  Prognosis:  < 6 months  Discharge Planning: To Be Determined      Primary Diagnoses: Present on Admission:  Acute on chronic combined systolic (congestive) and diastolic (congestive) heart failure ([place])   I have reviewed the medical record, interviewed the patient and family, and examined the patient. The following aspects are pertinent.  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   nodular on left jawline - excision   Basal cell carcinoma 07/01/2014   nodular on left hawling - CX3+5FU+excision   Basal cell carcinoma 10/10/2014   nodular on right forearm, middle - tx p bx   Basal cell carcinoma 02/11/2015   left neck - CX3 + excision   Basal cell carcinoma 06/14/2016   left jawline - CX3+5FU   Basal cell carcinoma 02/22/2017   superficial and nodular on left neck - excision   Basal cell carcinoma 04/11/2018   superficial and nodular on right inferior forearm - CX3+Cautery+5FU   Chronic kidney disease    History of renal transplant    Hyperlipidemia    Hypertension    NSTEMI (non-ST elevated myocardial infarction) ([place]) 02/13/2020   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   Left [place] (well diff)   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   Right Breast (in situ)   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   Left Forearm - anterior (mod diff)   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   Neck - anterior (mod diff)   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   in  situ on left arm - CX3+5FU   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   hypertrophic on left ear - CX3+5FU   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   in situ on right temple - CX3+5FU   Squamous cell carcinoma of  skin 11/08/2011   right upper forearm - tx p bx   Squamous cell carcinoma of skin 11/08/2011   left upper forearm - tx p bx   Squamous cell carcinoma of skin 11/08/2011   left hand - tx p bx   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/27/2013   in situ on left lower back - CX3+5FU   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/27/2013   in situ on left forehead - CX3+5FU   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/27/2013   in situ on right temple - watch per ST   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/27/2013   in situ on right forearm - CX3+5FU   Squamous cell carcinoma of skin 07/01/2014   well differentiated on right sideburn - CX3+5FU   Squamous cell carcinoma of skin 07/01/2014   in situ on left shoulder - CX3+5FU   Squamous cell carcinoma of skin 07/01/2014   in situ on right forearm, proximal - CX3+5FU+Cautery   Squamous cell carcinoma of skin 07/01/2014   in situ on right forearm, distal - CX3+5FU   Squamous cell carcinoma of skin 09/30/2015   in situ on posterior left ear - CX3+5FU   Squamous cell carcinoma of skin 02/22/2017   in situ on right upper arm - CX3+5FU   Squamous cell carcinoma of skin 02/22/2017   in situ on left upper arm - CX3+5FU   Squamous cell carcinoma of skin 04/11/2018   in situ on right temple - CX3+5FU   Squamous cell carcinoma of skin 04/11/2018   in situ on lateral right arm - MOHs   Squamous cell carcinoma of skin 04/11/2018   in situ on right upper arm - MOHs   Squamous cell carcinoma of skin 04/11/2018   in situ on left sideburn   Squamous cell carcinoma of skin 04/11/2018   in situ on right flank - tx p bx   Squamous cell carcinoma of skin 09/28/2018   in situ on right inner ear - tx p bx   Squamous cell carcinoma of skin 09/28/2018   in situ on left inner ear - tx p bx   Squamous cell carcinoma of skin 09/28/2018   in situ on right arm - tx p bx   Squamous cell carcinoma of skin 03/21/2019   in situ on right antihelix ([place] treated topically)   Squamous  cell carcinoma of skin 03/21/2019   in situ on right neck - CX3+5FU   Squamous cell carcinoma of skin 03/21/2019   in situ on left outer eye, inf (MOHs done 05/23/2019)   Type 2 diabetes mellitus ([place])    Unspecified atrial fibrillation ([place]) 02/17/2020   Social History   Socioeconomic History   Marital status: Widowed    Spouse name: Not on file   Number of children: 3   Years of education: Not on file   Highest education level: Not on file  Occupational History   Not on file  Tobacco Use   Smoking status: Some Days    Types: Pipe   Smokeless tobacco: Never  Vaping Use   Vaping Use: Never used  Substance and Sexual Activity   Alcohol use: Never   Drug use: Never   Sexual activity:  Not Currently  Other Topics Concern   Not on file  Social History Narrative   Wife passed away in 04-20-2007.   2 daughters, 1 son. All live close by.    3 grandchildren.    Social Determinants of Health   Financial Resource Strain: Low Risk  (12/07/2021)   Overall Financial Resource Strain (CARDIA)    Difficulty of Paying Living Expenses: Not hard at all  Food Insecurity: No Food Insecurity (12/19/2021)   Hunger Vital Sign    Worried About Running Out of Food in the Last Year: Never true    Ran Out of Food in the Last Year: Never true  Transportation Needs: No Transportation Needs (12/22/2021)   PRAPARE - Hydrologist (Medical): No    Lack of Transportation (Non-Medical): No  Physical Activity: Insufficiently Active (04/15/2021)   Exercise Vital Sign    Days of Exercise per Week: 7 days    Minutes of Exercise per Session: 20 min  Stress: No Stress Concern Present (04/15/2021)   [place]    Feeling of Stress : Not at all  Social Connections: Moderately Integrated (04/15/2021)   Social Connection and Isolation Panel [NHANES]    Frequency of Communication with Friends and Family: More than three times a  week    Frequency of Social Gatherings with Friends and Family: More than three times a week    Attends Religious Services: 1 to 4 times per year    Active Member of [employer] or Organizations: No    Attends Archivist Meetings: 1 to 4 times per year    Marital Status: Widowed   Family History  Problem Relation Age of Onset   Clotting disorder Mother    Hypertension Sister    Diabetes Sister    Scheduled Meds:  amiodarone  100 mg Oral Daily   apixaban  5 mg Oral BID   [START ON 01/02/2022] aspirin EC  81 mg Oral Daily   atorvastatin  40 mg Oral Daily   ezetimibe  10 mg Oral Daily   furosemide  80 mg Intravenous BID   insulin aspart  0-15 Units Subcutaneous TID WC   insulin NPH Human  10 Units Subcutaneous BID AC & HS   metoprolol succinate  25 mg Oral Daily   mycophenolate  500 mg Oral BID   predniSONE  5 mg Oral Q breakfast   sodium chloride flush  3 mL Intravenous Q12H   tacrolimus  1 mg Oral BID   Vericiguat  2.5 mg Oral Daily   Continuous Infusions:  sodium chloride     PRN Meds:.sodium chloride, acetaminophen, albuterol, ALPRAZolam, nitroGLYCERIN, ondansetron (ZOFRAN) IV, sodium chloride flush Medications Prior to Admission:  Prior to Admission medications   Medication Sig Start Date End Date Taking? Authorizing Provider  acetaminophen (TYLENOL) 500 MG tablet Take 500 mg by mouth every 8 (eight) hours as needed for moderate pain.   Yes [provider]  albuterol (VENTOLIN HFA) 108 (90 Base) MCG/ACT inhaler Inhale 2 puffs into the lungs every 6 (six) hours as needed for wheezing or shortness of breath. 11/13/21  Yes Stacks, [name], MD  ALPRAZolam Duanne Moron) 0.25 MG tablet Take 1 tablet (0.25 mg total) by mouth at bedtime as needed for anxiety. 12/28/21  Yes Claretta Fraise, MD  amiodarone (PACERONE) 200 MG tablet Take 0.5 tablets (100 mg total) by mouth daily. 10/14/21  Yes Larey Dresser, MD  atorvastatin (LIPITOR) 40  MG tablet Take 1 tablet (40 mg total) by  mouth daily. 11/10/21  Yes Stacks, [name], MD  ELIQUIS 5 MG TABS tablet Take 1 tablet by mouth twice daily 11/09/21  Yes Larey Dresser, MD  ezetimibe (ZETIA) 10 MG tablet Take 1 tablet (10 mg total) by mouth daily. 08/17/21  Yes Satira Sark, MD  fluticasone [facility]) 50 MCG/ACT nasal spray Place 1 spray into both nostrils daily as needed for allergies. 12/21/21  Yes Johnson, Clanford L, MD  furosemide (LASIX) 40 MG tablet Take 1 tablet (40 mg total) by mouth 2 (two) times daily. 12/21/21  Yes Johnson, Clanford L, MD  insulin NPH Human (NOVOLIN N) 100 UNIT/ML injection 10 units AC breakfast and 10 units AC supper 08/05/21  Yes Stacks, [name], MD  insulin regular (NOVOLIN R) 100 units/mL injection 16 units w bkfst and 13 units w dinner 04/30/21  Yes Stacks, [name], MD  loratadine (CLARITIN) 10 MG tablet Take 10 mg by mouth daily as needed for allergies. 10/24/06  Yes [provider]  metoprolol succinate (TOPROL-XL) 25 MG 24 hr tablet Take 1 tablet (25 mg total) by mouth daily. Take with or immediately following a meal. 12/21/21  Yes Johnson, Clanford L, MD  mycophenolate (CELLCEPT) 250 MG capsule Take 500 mg by mouth 2 (two) times daily. 06/10/14  Yes [provider]  nitroGLYCERIN (NITROSTAT) 0.4 MG SL tablet Place 1 tablet (0.4 mg total) under the tongue every 5 (five) minutes as needed for chest pain. 11/05/20  Yes Larey Dresser, MD  ondansetron (ZOFRAN) 4 MG tablet Take 1 tablet (4 mg total) by mouth every 8 (eight) hours as needed for nausea or vomiting. 04/17/20  Yes Stacks, [name], MD  predniSONE (DELTASONE) 5 MG tablet Take 5 mg by mouth daily with breakfast.   Yes [provider]  spironolactone (ALDACTONE) 25 MG tablet Take 0.5 tablets (12.5 mg total) by mouth daily. 12/21/21  Yes Johnson, Clanford L, MD  tacrolimus (PROGRAF) 0.5 MG capsule Take 1 mg by mouth in the morning and at bedtime. 07/08/15  Yes [provider]  Vericiguat (VERQUVO) 2.5 MG TABS  Take 2.5 mg by mouth daily. 12/01/21  Yes Larey Dresser, MD   Allergies  Allergen Reactions   Tape Rash    Use paper tape.    Trazodone And Nefazodone Palpitations    tachycardia   Mirtazapine     imbalance   Elemental Sulfur Rash   Sulfa Antibiotics Rash   Review of Systems  Constitutional:  Positive for fatigue. Negative for activity change.  Respiratory:  Negative for shortness of breath.   Gastrointestinal:  Negative for nausea and vomiting.  Neurological:  Positive for weakness.  All other systems reviewed and are negative.   Physical Exam Vitals and nursing note reviewed.  Constitutional:      General: He is not in acute distress.    Appearance: He is ill-appearing.  Pulmonary:     Effort: No respiratory distress.     Comments: Conversational dyspnea Skin:    General: Skin is warm and dry.  Neurological:     Mental Status: He is alert and oriented to person, place, and time.     Motor: Weakness present.  Psychiatric:        Attention and Perception: Attention normal.        Behavior: Behavior is cooperative.        Cognition and Memory: Cognition and memory normal.     Vital Signs: BP (!) 106/52  Pulse 73   Temp 99.6 F (37.6 C) (Oral)   Resp (!) 29   Ht _0  (1.778 m)   Wt 85.7 kg   SpO2 95%   BMI 27.12 kg/m  Pain Scale: 0-10   Pain Score: 0-No pain   SpO2: SpO2: 95 % O2 Device:SpO2: 95 % O2 Flow Rate: .O2 Flow Rate (L/min): 2 L/min  IO: Intake/output summary:  Intake/Output Summary (Last 24 hours) at 01/01/2022 1235 Last data filed at 01/01/2022 6811 Gross per 24 hour  Intake --  Output 200 ml  Net -200 ml    LBM: Last BM Date : 01/01/23 Baseline Weight: Weight: 85.7 kg Most recent weight: Weight: 85.7 kg     Palliative Assessment/Data: PPS50-60%     Time In: 1230 Time Out: 1400 Time Total: 90 minutes  Greater than 50%  of this time was spent counseling and coordinating care related to the above assessment and  plan.  Signed by: Lin Landsman, NP   Please contact Palliative Medicine Team phone at [phone] for questions and concerns.  For individual provider: See Amion  *Portions of this note are a verbal dictation therefore any spelling and/or grammatical errors are due to the "[place]" system interpretation.

## 2022-01-01 NOTE — Consult Note (Addendum)
Cardiology Consultation   Patient ID: Daniel Myers MRN: [id number]; DOB: Jan 02, 1946  Admit date: 01/01/2022 Date of Consult: 01/01/2022  PCP:  Claretta Fraise, [place] Providers Cardiologist:  Rozann Lesches, MD    Patient Profile:   Daniel Myers is a 76 y.o. male with a hx of CAD, chronic systolic heart failure, paroxysmal atrial fibrillation, CKD stage III s/p renal transplant, HTN, HLD, type 2 DM who is being seen 01/01/2022 for the evaluation of CHF at the request of Dr. Lorin Mercy.  History of Present Illness:   Daniel Myers is a 76 year old male with above medical history.  He is followed by Dr. Aundra Dubin with advanced heart failure.  Patient establish care with cardiology in 02/2020 during an admission for COVID pneumonia.  During that admission, he was noted to have chest pain, elevated troponins, atrial fibrillation.  Underwent echocardiogram on 02/12/2020 that showed EF 40-45% with regional wall motion abnormalities, mild LVH, grade 3 diastolic dysfunction, normal RV systolic function.  As he was acutely ill with COVID-pneumonia, he was treated with IV heparin and ischemic evaluation was deferred.  He had a CVA in 03/2020 that was thought to be related to atrial fibrillation.  He eventually underwent nuclear stress test on 05/02/2020 that was negative for ischemia, but that showed a large fixed defect in the inferior and inferolateral walls, distal anterior wall and apex consistent with scar.  EF was calculated at 21%.  Follow-up echocardiogram on 06/05/2020 showed EF 25-30%, global hypokinesis, mildly reduced RV systolic function.  He was referred to advanced heart failure.  Underwent right/left heart catheterization with Dr. Aundra Dubin on 09/09/2020 that showed normal filling pressures, severe three-vessel disease with an occluded RCA, 99% stenosis of proximal-mid LAD, 80% stenosis in ostial left circumflex.  He saw Dr. Caffie Pinto with CABG, however patient decided that he did not want  to undergo CABG.  Dr. Aundra Dubin reviewed cath films with Dr. Burt Knack, however it was determined that patient would not derive much benefit from PCI in the absence of significant angina.  In 10/2020, patient had DCCV back to normal sinus rhythm.  Patient was admitted from 11/17/2021 - 11/20/2021 with NSTEMI.  Echocardiogram on 11/17/2021 showed EF 24-58%, grade 3 diastolic dysfunction, mildly reduced RV systolic function, moderately dilated left atrium and right atrium.  Patient was adamant that he did not want to undergo any further invasive procedures.  He became DNR and completed 48 hours of IV heparin.  Patient was admitted from 12/14/2021 - 12/21/2021 for treatment of heart failure. Patient was diuresed and discharged on PO lasix, metoprolol, amiodarone, eliquis, atorvastatin, ezetimibe. Noted that patient did not tolerate ACEi due to hyperkalemia, did not tolerate entresto due to hypotension, and did not tolerate MRA due to worsening renal function.  Patient refused ICD, so he was not a candidate for lifevest.   Patient presented to the ED on 11/24 complaining of SOB. Labs in the ED showed K 5.2, creatinine 2.11, mag 2.1, WBC 4.9, hemoglobin 12.0, platelets 112. hsTn 104>>217. BNP elevated to 3800.9.   EKG showed normal sinus rhythm, HR 105 BPM, nonspecific IVCD, Q waves in leads III, V1- V5. CXR showed cardiac enlargement and pulmonary edema. Patient was admitted to the hospitalist service and cardiology was asked to consult for management of CHF.   On interview, patient reports that he was in his usual state of health last evening when he went to bed.  He was woken up in the middle of the night with  news that his sister had passed away.  He noticed that he was acutely short of breath at that time.  Also noted some left sided chest tightness.  He is more concerned about his shortness of breath.  He has noticed an increase in how much she was coughing.  Cough is productive of a clear sputum.  He has also  experienced ankle edema.  Denies fever, chills, body aches, nasal congestion.  He has been around a few people that were positive for COVID.  He reports that he actually has been losing weight since being sent home from the hospital a week ago. Upon arrival to the ED, patient was given a dose of IV Lasix 80 mg.  Reports that he is feeling better than when he first came in.  He is chest pain-free.  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   nodular on left jawline - excision   Basal cell carcinoma 07/01/2014   nodular on left hawling - CX3+5FU+excision   Basal cell carcinoma 10/10/2014   nodular on right forearm, middle - tx p bx   Basal cell carcinoma 02/11/2015   left neck - CX3 + excision   Basal cell carcinoma 06/14/2016   left jawline - CX3+5FU   Basal cell carcinoma 02/22/2017   superficial and nodular on left neck - excision   Basal cell carcinoma 04/11/2018   superficial and nodular on right inferior forearm - CX3+Cautery+5FU   Chronic kidney disease    History of renal transplant    Hyperlipidemia    Hypertension    NSTEMI (non-ST elevated myocardial infarction) ([place]) 02/13/2020   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   Left [place] (well diff)   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   Right Breast (in situ)   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   Left Forearm - anterior (mod diff)   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   Neck - anterior (mod diff)   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   in situ on left arm - CX3+5FU   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   hypertrophic on left ear - CX3+5FU   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   in situ on right temple - CX3+5FU   Squamous cell carcinoma of skin 11/08/2011   right upper forearm - tx p bx   Squamous cell carcinoma of skin 11/08/2011   left upper forearm - tx p bx   Squamous cell carcinoma of skin 11/08/2011    left hand - tx p bx   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/27/2013   in situ on left lower back - CX3+5FU   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/27/2013   in situ on left forehead - CX3+5FU   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/27/2013   in situ on right temple - watch per ST   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/27/2013   in situ on right forearm - CX3+5FU   Squamous cell carcinoma of skin 07/01/2014   well differentiated on right sideburn - CX3+5FU   Squamous cell carcinoma of skin 07/01/2014   in situ on left shoulder - CX3+5FU   Squamous cell carcinoma of skin 07/01/2014   in situ on right forearm, proximal - CX3+5FU+Cautery   Squamous cell carcinoma of skin 07/01/2014   in situ on right forearm, distal - CX3+5FU   Squamous cell carcinoma of skin 09/30/2015   in situ on  posterior left ear - CX3+5FU   Squamous cell carcinoma of skin 02/22/2017   in situ on right upper arm - CX3+5FU   Squamous cell carcinoma of skin 02/22/2017   in situ on left upper arm - CX3+5FU   Squamous cell carcinoma of skin 04/11/2018   in situ on right temple - CX3+5FU   Squamous cell carcinoma of skin 04/11/2018   in situ on lateral right arm - MOHs   Squamous cell carcinoma of skin 04/11/2018   in situ on right upper arm - MOHs   Squamous cell carcinoma of skin 04/11/2018   in situ on left sideburn   Squamous cell carcinoma of skin 04/11/2018   in situ on right flank - tx p bx   Squamous cell carcinoma of skin 09/28/2018   in situ on right inner ear - tx p bx   Squamous cell carcinoma of skin 09/28/2018   in situ on left inner ear - tx p bx   Squamous cell carcinoma of skin 09/28/2018   in situ on right arm - tx p bx   Squamous cell carcinoma of skin 03/21/2019   in situ on right antihelix ([place]kov treated topically)   Squamous cell carcinoma of skin 03/21/2019   in situ on right neck - CX3+5FU   Squamous cell carcinoma of skin 03/21/2019   in situ on left outer eye, inf (MOHs done 05/23/2019)   Type  2 diabetes mellitus ([place])    Unspecified atrial fibrillation ([place]) 02/17/2020    Past Surgical History:  Procedure Laterality Date   CARDIOVERSION N/A 10/16/2020   Procedure: CARDIOVERSION;  Surgeon: Larey Dresser, MD;  Location: [facility] ENDOSCOPY;  Service: Cardiovascular;  Laterality: N/A;   KIDNEY TRANSPLANT Right    RIGHT/LEFT HEART CATH AND CORONARY ANGIOGRAPHY N/A 09/09/2020   Procedure: RIGHT/LEFT HEART CATH AND CORONARY ANGIOGRAPHY;  Surgeon: Larey Dresser, MD;  Location: [facility] CV LAB;  Service: Cardiovascular;  Laterality: N/A;     Home Medications:  Prior to Admission medications   Medication Sig Start Date End Date Taking? Authorizing Provider  acetaminophen (TYLENOL) 500 MG tablet Take 500 mg by mouth every 8 (eight) hours as needed for moderate pain.   Yes [provider]  albuterol (VENTOLIN HFA) 108 (90 Base) MCG/ACT inhaler Inhale 2 puffs into the lungs every 6 (six) hours as needed for wheezing or shortness of breath. 11/13/21  Yes Stacks, [name], MD  ALPRAZolam Duanne Moron) 0.25 MG tablet Take 1 tablet (0.25 mg total) by mouth at bedtime as needed for anxiety. 12/28/21  Yes Claretta Fraise, MD  amiodarone (PACERONE) 200 MG tablet Take 0.5 tablets (100 mg total) by mouth daily. 10/14/21  Yes Larey Dresser, MD  atorvastatin (LIPITOR) 40 MG tablet Take 1 tablet (40 mg total) by mouth daily. 11/10/21  Yes Stacks, [name], MD  ELIQUIS 5 MG TABS tablet Take 1 tablet by mouth twice daily 11/09/21  Yes Larey Dresser, MD  ezetimibe (ZETIA) 10 MG tablet Take 1 tablet (10 mg total) by mouth daily. 08/17/21  Yes Satira Sark, MD  fluticasone [facility]) 50 MCG/ACT nasal spray Place 1 spray into both nostrils daily as needed for allergies. 12/21/21  Yes Johnson, Clanford L, MD  furosemide (LASIX) 40 MG tablet Take 1 tablet (40 mg total) by mouth 2 (two) times daily. 12/21/21  Yes Johnson, Clanford L, MD  insulin NPH Human (NOVOLIN N) 100 UNIT/ML injection 10 units AC breakfast  and 10 units AC supper 08/05/21  Yes  Claretta Fraise, MD  insulin regular (NOVOLIN R) 100 units/mL injection 16 units w bkfst and 13 units w dinner 04/30/21  Yes Stacks, [name], MD  loratadine (CLARITIN) 10 MG tablet Take 10 mg by mouth daily as needed for allergies. 10/24/06  Yes [provider]  metoprolol succinate (TOPROL-XL) 25 MG 24 hr tablet Take 1 tablet (25 mg total) by mouth daily. Take with or immediately following a meal. 12/21/21  Yes Johnson, Clanford L, MD  mycophenolate (CELLCEPT) 250 MG capsule Take 500 mg by mouth 2 (two) times daily. 06/10/14  Yes [provider]  nitroGLYCERIN (NITROSTAT) 0.4 MG SL tablet Place 1 tablet (0.4 mg total) under the tongue every 5 (five) minutes as needed for chest pain. 11/05/20  Yes Larey Dresser, MD  ondansetron (ZOFRAN) 4 MG tablet Take 1 tablet (4 mg total) by mouth every 8 (eight) hours as needed for nausea or vomiting. 04/17/20  Yes Stacks, [name], MD  predniSONE (DELTASONE) 5 MG tablet Take 5 mg by mouth daily with breakfast.   Yes [provider]  spironolactone (ALDACTONE) 25 MG tablet Take 0.5 tablets (12.5 mg total) by mouth daily. 12/21/21  Yes Johnson, Clanford L, MD  tacrolimus (PROGRAF) 0.5 MG capsule Take 1 mg by mouth in the morning and at bedtime. 07/08/15  Yes [provider]  Vericiguat (VERQUVO) 2.5 MG TABS Take 2.5 mg by mouth daily. 12/01/21  Yes Larey Dresser, MD    Inpatient Medications: Scheduled Meds:  amiodarone  100 mg Oral Daily   apixaban  5 mg Oral BID   [START ON 01/02/2022] aspirin EC  81 mg Oral Daily   atorvastatin  40 mg Oral Daily   ezetimibe  10 mg Oral Daily   furosemide  40 mg Oral BID   insulin aspart  0-15 Units Subcutaneous TID WC   insulin NPH Human  10 Units Subcutaneous BID AC & HS   metoprolol succinate  25 mg Oral Daily   mycophenolate  500 mg Oral BID   predniSONE  5 mg Oral Q breakfast   sodium chloride flush  3 mL Intravenous Q12H   spironolactone  12.5 mg  Oral Daily   tacrolimus  1 mg Oral BID   Vericiguat  2.5 mg Oral Daily   Continuous Infusions:  sodium chloride     PRN Meds: sodium chloride, acetaminophen, albuterol, ALPRAZolam, nitroGLYCERIN, ondansetron (ZOFRAN) IV, sodium chloride flush  Allergies:    Allergies  Allergen Reactions   Tape Rash    Use paper tape.    Trazodone And Nefazodone Palpitations    tachycardia   Mirtazapine     imbalance   Elemental Sulfur Rash   Sulfa Antibiotics Rash    Social History:   Social History   Socioeconomic History   Marital status: Widowed    Spouse name: Not on file   Number of children: 3   Years of education: Not on file   Highest education level: Not on file  Occupational History   Not on file  Tobacco Use   Smoking status: Some Days    Types: Pipe   Smokeless tobacco: Never  Vaping Use   Vaping Use: Never used  Substance and Sexual Activity   Alcohol use: Never   Drug use: Never   Sexual activity: Not Currently  Other Topics Concern   Not on file  Social History Narrative   Wife passed away in Apr 27, 2007.   2 daughters, 1 son. All live close by.  3 grandchildren.    Social Determinants of Health   Financial Resource Strain: Low Risk  (12/07/2021)   Overall Financial Resource Strain (CARDIA)    Difficulty of Paying Living Expenses: Not hard at all  Food Insecurity: No Food Insecurity (12/19/2021)   Hunger Vital Sign    Worried About Running Out of Food in the Last Year: Never true    Ran Out of Food in the Last Year: Never true  Transportation Needs: No Transportation Needs (12/22/2021)   PRAPARE - Hydrologist (Medical): No    Lack of Transportation (Non-Medical): No  Physical Activity: Insufficiently Active (04/15/2021)   Exercise Vital Sign    Days of Exercise per Week: 7 days    Minutes of Exercise per Session: 20 min  Stress: No Stress Concern Present (04/15/2021)   [place]    Feeling of Stress : Not at all  Social Connections: Moderately Integrated (04/15/2021)   Social Connection and Isolation Panel [NHANES]    Frequency of Communication with Friends and Family: More than three times a week    Frequency of Social Gatherings with Friends and Family: More than three times a week    Attends Religious Services: 1 to 4 times per year    Active Member of [employer] or Organizations: No    Attends Archivist Meetings: 1 to 4 times per year    Marital Status: Widowed  Intimate Partner Violence: Not At Risk (12/19/2021)   Humiliation, Afraid, Rape, and Kick questionnaire    Fear of Current or Ex-Partner: No    Emotionally Abused: No    Physically Abused: No    Sexually Abused: No    Family History:    Family History  Problem Relation Age of Onset   Clotting disorder Mother    Hypertension Sister    Diabetes Sister      ROS:  Please see the history of present illness.   All other ROS reviewed and negative.     Physical Exam/Data:   Vitals:   01/01/22 0700 01/01/22 0730 01/01/22 0732 01/01/22 0848  BP: (!) 128/54 135/63    Pulse: 94 89    Resp: (!) 27 (!) 23    Temp:   100.1 F (37.8 C) 99.6 F (37.6 C)  TempSrc:   Oral Oral  SpO2: 97% 97%    Weight:      Height:        Intake/Output Summary (Last 24 hours) at 01/01/2022 1043 Last data filed at 01/01/2022 0839 Gross per 24 hour  Intake --  Output 200 ml  Net -200 ml      01/01/2022    5:42 AM 12/28/2021    3:31 PM 12/21/2021    5:24 AM  Last 3 Weights  Weight (lbs) 189 lb 200 lb 201 lb 8 oz  Weight (kg) 85.73 kg 90.719 kg 91.4 kg     Body mass index is 27.12 kg/m.  General:  Chronically-ill appearing elderly gentleman. Laying in bed with head elevated to 20 degrees. No acute distress  HEENT: normal Neck: no JVD with head of bed elevated to 30 degrees. No hepatojugular reflex noted  Vascular: Radial pulses 2+ bilaterally Cardiac:  normal S1, S2; RRR; no murmur   Lungs:  crackles in bilateral lung bases. Normal WOB on [place] with 2L supplemental oxygen  Abd: soft, nontender, no hepatomegaly  Ext: 2+ pitting edema in BLE  Musculoskeletal:  No  deformities, BUE and BLE strength normal and equal Skin: warm and dry. Hands and feet are warm and well-perfused  Neuro:  CNs 2-12 intact, no focal abnormalities noted Psych:  Normal affect   EKG:  The EKG was personally reviewed and demonstrates:  normal sinus rhythm, HR 105 BPM, nonspecific IVCD, Q waves in leads III, V1- V5 Telemetry:  Telemetry was personally reviewed and demonstrates:  Normal sinus rhythm   Relevant CV Studies:  Echocardiogram 11/17/2021   1. Left ventricular ejection fraction, by estimation, is 20 to 25%. The  left ventricle has severely decreased function. The left ventricle  demonstrates global hypokinesis. Left ventricular diastolic parameters are  consistent with Grade III diastolic  dysfunction (restrictive).   2. Right ventricular systolic function is mildly reduced. The right  ventricular size is normal.   3. Left atrial size was moderately dilated.   4. Right atrial size was moderately dilated.   5. The mitral valve is normal in structure. No evidence of mitral valve  regurgitation. No evidence of mitral stenosis.   6. The aortic valve is tricuspid. Aortic valve regurgitation is not  visualized. No aortic stenosis is present.   7. The inferior vena cava is normal in size with greater than 50%  respiratory variability, suggesting right atrial pressure of 3 mmHg.   Comparison(s): Prior images reviewed side by side. Prior EF described as  30-35%.   Laboratory Data:  High Sensitivity Troponin:   Recent Labs  Lab 12/14/21 1101 12/14/21 1357 01/01/22 0547 01/01/22 0801  TROPONINIHS 127* 130* 104* 217*     Chemistry Recent Labs  Lab 12/28/21 1615 01/01/22 0547 01/01/22 0555  NA 138 137 134*  K 4.9 5.2* 5.1  CL 104 100 103  CO2 20 22  --   GLUCOSE 184* 216* 217*   BUN 43* 48* 44*  CREATININE 1.98* 2.11* 2.20*  CALCIUM 8.0* 8.5*  --   MG  --  2.1  --   GFRNONAA  --  32*  --   ANIONGAP  --  15  --     Recent Labs  Lab 12/28/21 1615 01/01/22 0547  PROT 5.3* 6.0*  ALBUMIN 3.6* 3.5  AST 17 23  ALT 10 15  ALKPHOS 82 70  BILITOT 0.8 1.3*   Lipids No results for input(s): "CHOL", "TRIG", "HDL", "LABVLDL", "LDLCALC", "CHOLHDL" in the last 168 hours.  Hematology Recent Labs  Lab 12/28/21 1615 01/01/22 0547 01/01/22 0555  WBC 3.4 4.9  --   RBC 3.99* 4.33  --   HGB 11.1* 12.0* 13.6  HCT 36.3* 40.5 40.0  MCV 91 93.5  --   MCH 27.8 27.7  --   MCHC 30.6* 29.6*  --   RDW 15.8* 16.9*  --   PLT 152 112*  --    Thyroid No results for input(s): "TSH", "FREET4" in the last 168 hours.  BNP Recent Labs  Lab 12/28/21 1615 01/01/22 0547  BNP >5000.0* 3,800.9*    DDimer No results for input(s): "DDIMER" in the last 168 hours.   Radiology/Studies:  DG Chest Portable 1 View  Result Date: 01/01/2022 CLINICAL DATA:  Shortness of breath.  Chest pain. EXAM: PORTABLE CHEST 1 VIEW COMPARISON:  12/14/2021 FINDINGS: Cardiac enlargement. There is diffuse increase interstitial markings identified bilaterally concerning for pulmonary edema. No airspace opacity scratch set no airspace consolidation. No signs of pleural effusion. IMPRESSION: Cardiac enlargement and pulmonary edema. Correlate for any signs or symptoms of CHF. Electronically Signed   By: Kerby Moors  M.D.   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  Patient in the past has refused further invasive procedures including ICD, cardiac catheterization, CABG - BNP elevated to 3800 on presentation. CXR with pulmonary edema  - Patient has received 1 dose of  IV lasix 60 mg this AM.  - Start IV lasix 80 mg BID this evening  - Strict I/Os, daily weights. Needs close monitoring of renal function and electrolytes - Continue metoprolol succinate 25 mg daily - GDMT limited by poor renal function  - Patient has been on spironolactone at home, but I do not think that this is a good medicine for him to be on given his CKD, history of hyperkalemia. He did have elevated K on presentation. Stop spironolactone    - Suspect that SOB is multifactorial given emotional stress (sister passed away), recent URI. However, there is evidence of volume overload so we will diurese   Coronary artery disease Elevated troponin -Cardiac catheterization in 09/2020 showed severe three-vessel disease with an occluded RCA, 99% stenosis in the proximal-mid LAD, 80% stenosis in the ostial left circumflex, significant disease in the OM's and diagonals.  He has previously been evaluated by CT surgery, but he is adamant that he does not want to have surgery.  Dr. Aundra Dubin and Dr. Burt Knack considered intervention on LAD, however it was unlikely patient will get much benefit.  Additionally, patient continues to refuse invasive studies -This admission, hsTn 104>>217. Note, these values are similar to hsTn values earlier this month (127>>130) - Patient did have chest pain overnight, but has been chest pain free in the ED  - I suspect this is demand ischemia in the setting of volume overload, poor renal function, known CAD/total occlusion of RCA and LAD. No need for IV heparin. No plans for ischemic eval as patient adamantly does not want invasive procedures  - Continue ASA, lipitor, metoprolol   Stage IV CKD s/p renal transplant  - Creatinine 2.11 on presentation- baseline appears to be around 2.90-3.30 - Follow renal function closely during diuresis   HTN  - BP well controlled  - Continue metoprolol   Risk Assessment/Risk Scores:    New York Heart Association (NYHA) Functional Class NYHA  Class IV  CHA2DS2-VASc Score = 5   This indicates a 7.2% annual risk of stroke. The patient's score is based upon: CHF History: 0 HTN History: 1 Diabetes History: 1 Stroke History: 0 Vascular Disease History: 1 Age Score: 2 Gender Score: 0       For questions or updates, please contact [place] Please consult [email or web address] for contact info under    Signed, Margie Billet, PA-C  01/01/2022 10:43 AM  I have seen and examined the patient along with Margie Billet, PA-C.  I have reviewed the chart, notes and new data.  I agree with PA/NP's note.  Key new complaints: Breathing improved immediately with oxygen supplementation, before the effect of diuretics has really kicked in.  At home, he intermittently takes nitroglycerin with brief improvement in his shortness of breath.  He does not have chest pain. Key examination changes: Jugular venous distention and 2+ pitting lower extremity edema, a few bilateral lung rales in the bases. Key new findings / data: Minimal elevation in high-sensitivity troponin I is consistent  with chronic subendocardial ischemia, rather than an acute coronary insufficiency event.  ECG changes are chronic, atypical intraventricular conduction delay.  PLAN: Prognosis is dismal.  Severe ischemic cardiomyopathy with EF less than 30% and restrictive filling pattern, signs of poor prognosis.   He has refused surgical revascularization.  He does not want any invasive procedures.  Highly unlikely he would benefit from PCI anyway from the point of view of his heart failure.  He does not have angina. Strongly recommend palliative care and home hospice.  He would benefit from home oxygen and a hospital bed.  Sanda Klein, MD, [place] [phone] 01/01/2022, 12:17 PM

## 2022-01-01 NOTE — H&P (Signed)
History and Physical    Patient: Daniel Myers DOB: 26-Feb-1945 DOA: 01/01/2022 DOS: the patient was seen and examined on 01/01/2022 PCP: Claretta Fraise, MD  Patient coming from: Home - lives alone, daughter lives next door; NOK: Daughter, [name], [phone]   Chief Complaint: CP/SOB  HPI: Daniel Myers is a 76 y.o. male with medical history significant of chronic combined CHF (declined AICD); renal transplant; HTN; HLD; CAD (declined CABG); stage 3b CKD; DM; and afib on Eliquis presenting with CP/SOB.  He was last admitted from 11/6-13 with acute combined CHF and afib. He woke up last night and was acutely SOB.  No SOB at bedtime.  A telephone call woke him up with news that his sister died.  He has been around sick contacts with COVID.  Some cough, productive of clear sputum.  Weight down from hospitalization.  No fever at home.  +CP last night.      ER Course:  h/o CHF, ECHO 20-25%, recently admitted for the same.  Acute SOB and CP overnight.  Exam and CXR look like CHF.  EKG changes, cardiology to consult.     Review of Systems: As mentioned in the history of present illness. All other systems reviewed and are negative. 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   nodular on left jawline - excision   Basal cell carcinoma 07/01/2014   nodular on left hawling - CX3+5FU+excision   Basal cell carcinoma 10/10/2014   nodular on right forearm, middle - tx p bx   Basal cell carcinoma 02/11/2015   left neck - CX3 + excision   Basal cell carcinoma 06/14/2016   left jawline - CX3+5FU   Basal cell carcinoma 02/22/2017   superficial and nodular on left neck - excision   Basal cell carcinoma 04/11/2018   superficial and nodular on right inferior forearm - CX3+Cautery+5FU   Chronic kidney disease    History of renal transplant    Hyperlipidemia    Hypertension    NSTEMI (non-ST elevated myocardial infarction) ([place]) 02/13/2020   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   Left [place] (well diff)   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   Right Breast (in situ)   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   Left Forearm - anterior (mod diff)   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   Neck - anterior (mod diff)   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   in situ on left arm - CX3+5FU   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   hypertrophic on left ear - CX3+5FU   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   in situ on right temple - CX3+5FU   Squamous cell carcinoma of skin 11/08/2011   right upper forearm - tx p bx   Squamous cell carcinoma of skin 11/08/2011   left upper forearm - tx p bx   Squamous cell carcinoma of skin 11/08/2011   left hand - tx p bx   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/27/2013   in situ on left lower back - CX3+5FU   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/27/2013   in situ on left forehead - CX3+5FU   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/27/2013   in situ on right temple - watch per ST   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/27/2013   in situ on right forearm - CX3+5FU   Squamous cell carcinoma of skin 07/01/2014  well differentiated on right sideburn - CX3+5FU   Squamous cell carcinoma of skin 07/01/2014   in situ on left shoulder - CX3+5FU   Squamous cell carcinoma of skin 07/01/2014   in situ on right forearm, proximal - CX3+5FU+Cautery   Squamous cell carcinoma of skin 07/01/2014   in situ on right forearm, distal - CX3+5FU   Squamous cell carcinoma of skin 09/30/2015   in situ on posterior left ear - CX3+5FU   Squamous cell carcinoma of skin 02/22/2017   in situ on right upper arm - CX3+5FU   Squamous cell carcinoma of skin 02/22/2017   in situ on left upper arm - CX3+5FU   Squamous cell carcinoma of skin 04/11/2018   in situ on right temple - CX3+5FU   Squamous cell carcinoma of skin 04/11/2018   in situ on lateral right arm - MOHs   Squamous  cell carcinoma of skin 04/11/2018   in situ on right upper arm - MOHs   Squamous cell carcinoma of skin 04/11/2018   in situ on left sideburn   Squamous cell carcinoma of skin 04/11/2018   in situ on right flank - tx p bx   Squamous cell carcinoma of skin 09/28/2018   in situ on right inner ear - tx p bx   Squamous cell carcinoma of skin 09/28/2018   in situ on left inner ear - tx p bx   Squamous cell carcinoma of skin 09/28/2018   in situ on right arm - tx p bx   Squamous cell carcinoma of skin 03/21/2019   in situ on right antihelix ([place] treated topically)   Squamous cell carcinoma of skin 03/21/2019   in situ on right neck - CX3+5FU   Squamous cell carcinoma of skin 03/21/2019   in situ on left outer eye, inf (MOHs done 05/23/2019)   Type 2 diabetes mellitus ([place])    Unspecified atrial fibrillation ([place]) 02/17/2020   Past Surgical History:  Procedure Laterality Date   CARDIOVERSION N/A 10/16/2020   Procedure: CARDIOVERSION;  Surgeon: Larey Dresser, MD;  Location: [facility] ENDOSCOPY;  Service: Cardiovascular;  Laterality: N/A;   KIDNEY TRANSPLANT Right    RIGHT/LEFT HEART CATH AND CORONARY ANGIOGRAPHY N/A 09/09/2020   Procedure: RIGHT/LEFT HEART CATH AND CORONARY ANGIOGRAPHY;  Surgeon: Larey Dresser, MD;  Location: [facility] LAB;  Service: Cardiovascular;  Laterality: N/A;   Social History:  reports that he has been smoking pipe. He has never used smokeless tobacco. He reports that he does not drink alcohol and does not use drugs.  Allergies  Allergen Reactions   Tape Rash    Use paper tape.    Trazodone And Nefazodone Palpitations    tachycardia   Mirtazapine     imbalance   Elemental Sulfur Rash   Sulfa Antibiotics Rash    Family History  Problem Relation Age of Onset   Clotting disorder Mother    Hypertension Sister    Diabetes Sister     Prior to Admission medications   Medication Sig Start Date End Date Taking? Authorizing Provider  acetaminophen  (TYLENOL) 500 MG tablet Take 500 mg by mouth every 8 (eight) hours as needed for moderate pain.    [provider]  albuterol (VENTOLIN HFA) 108 (90 Base) MCG/ACT inhaler Inhale 2 puffs into the lungs every 6 (six) hours as needed for wheezing or shortness of breath. 11/13/21   Claretta Fraise, MD  ALPRAZolam Duanne Moron) 0.25 MG tablet Take 1 tablet (0.25 mg total)  by mouth at bedtime as needed for anxiety. 12/28/21   Claretta Fraise, MD  amiodarone (PACERONE) 200 MG tablet Take 0.5 tablets (100 mg total) by mouth daily. 10/14/21   Larey Dresser, MD  atorvastatin (LIPITOR) 40 MG tablet Take 1 tablet (40 mg total) by mouth daily. 11/10/21   Claretta Fraise, MD  ELIQUIS 5 MG TABS tablet Take 1 tablet by mouth twice daily 11/09/21   Larey Dresser, MD  ezetimibe (ZETIA) 10 MG tablet Take 1 tablet (10 mg total) by mouth daily. 08/17/21   Satira Sark, MD  fluticasone (FLONASE) 50 MCG/ACT nasal spray Place 1 spray into both nostrils daily as needed for allergies. 12/21/21   Johnson, Clanford L, MD  furosemide (LASIX) 40 MG tablet Take 1 tablet (40 mg total) by mouth 2 (two) times daily. 12/21/21   Johnson, Clanford L, MD  insulin NPH Human (NOVOLIN N) 100 UNIT/ML injection 10 units AC breakfast and 10 units AC supper 08/05/21   Claretta Fraise, MD  insulin regular (NOVOLIN R) 100 units/mL injection 16 units w bkfst and 13 units w dinner 04/30/21   Claretta Fraise, MD  loratadine (CLARITIN) 10 MG tablet Take 10 mg by mouth daily as needed for allergies. 10/24/06   [provider]  metoprolol succinate (TOPROL-XL) 25 MG 24 hr tablet Take 1 tablet (25 mg total) by mouth daily. Take with or immediately following a meal. 12/21/21   Johnson, Clanford L, MD  mycophenolate (CELLCEPT) 250 MG capsule Take 500 mg by mouth 2 (two) times daily. 06/10/14   [provider]  nitroGLYCERIN (NITROSTAT) 0.4 MG SL tablet Place 1 tablet (0.4 mg total) under the tongue every 5 (five) minutes as needed for chest  pain. 11/05/20   Larey Dresser, MD  ondansetron (ZOFRAN) 4 MG tablet Take 1 tablet (4 mg total) by mouth every 8 (eight) hours as needed for nausea or vomiting. 04/17/20   Claretta Fraise, MD  predniSONE (DELTASONE) 5 MG tablet Take 5 mg by mouth daily with breakfast.    [provider]  sodium zirconium cyclosilicate (LOKELMA) 10 g PACK packet Take 10 g by mouth as directed. By the HF clinic just as needed 12/21/21   Murlean Iba, MD  spironolactone (ALDACTONE) 25 MG tablet Take 0.5 tablets (12.5 mg total) by mouth daily. 12/21/21   Johnson, Clanford L, MD  tacrolimus (PROGRAF) 0.5 MG capsule Take 1 mg by mouth in the morning and at bedtime. 07/08/15   [provider]  Vericiguat (VERQUVO) 2.5 MG TABS Take 2.5 mg by mouth daily. 12/01/21   Larey Dresser, MD    Physical Exam: Vitals:   01/01/22 1355 01/01/22 1445 01/01/22 1530 01/01/22 1615  BP:  (!) 113/57 120/65 123/62  Pulse:  65 71 65  Resp:  (!) 24 (!) 30 (!) 27  Temp: 98.8 F (37.1 C)     TempSrc: Oral     SpO2:  98% 96% 97%  Weight:      Height:       General:  Appears calm and comfortable and is in NAD Eyes:  EOMI, normal lids, iris ENT:  grossly normal hearing, lips & tongue, mmm Neck:  no LAD, masses or thyromegaly Cardiovascular:  RR with mild tachycardia, no m/r/g. No LE edema.  Respiratory:   CTA bilaterally with no wheezes/rales/rhonchi.  Mildly increased respiratory effort, on [place] O2. Abdomen:  soft, NT, ND Skin:  no rash or induration seen on limited exam Musculoskeletal:  grossly normal  tone BUE/BLE, good ROM, no bony abnormality Psychiatric:  grossly normal mood and affect, speech fluent and appropriate, AOx3 Neurologic:  CN 2-12 grossly intact, moves all extremities in coordinated fashion   Radiological Exams on Admission: Independently reviewed - see discussion in A/P where applicable  DG Chest Portable 1 View  Result Date: 01/01/2022 CLINICAL DATA:  Shortness of breath.  Chest  pain. EXAM: PORTABLE CHEST 1 VIEW COMPARISON:  12/14/2021 FINDINGS: Cardiac enlargement. There is diffuse increase interstitial markings identified bilaterally concerning for pulmonary edema. No airspace opacity scratch set no airspace consolidation. No signs of pleural effusion. IMPRESSION: Cardiac enlargement and pulmonary edema. Correlate for any signs or symptoms of CHF. Electronically Signed   By: Kerby Moors M.D.   On: 01/01/2022 06:41    EKG: Independently reviewed. Sinus tachycardia with rate 105; IVCD; nonspecific ST changes, ?ST elevation in V1-4    Labs on Admission: I have personally reviewed the available labs and imaging studies at the time of the admission.  Pertinent labs:    K+ 5.2 Glucose 216 BUN 48/Creatinine 2.11/GFR 32 - stable BNP 3800.9 HS troponin 104 -> 217 WBC 4.9 Hgb 12 Platelets 112 INR 1.5 COVID POSITIVE   Assessment and Plan: Principal Problem:   NSTEMI (non-ST elevated myocardial infarction) (HCC) Active Problems:   Insulin dependent type 2 diabetes mellitus (HCC)   Renal transplant recipient   CKD (chronic kidney disease) stage 4, GFR 15-29 ml/min (HCC)   Persistent atrial fibrillation (HCC)   Essential hypertension   Hyperlipidemia   COVID-19 virus infection   Hyperkalemia   DNR (do not resuscitate)   Acute on chronic combined systolic (congestive) and diastolic (congestive) heart failure (HCC)    NSTEMI -Patient with acute onset L-sided chest pain and SOB that came on acutely at rest after being notified that his sister died -Patient has known severe CAD and refused CABG, was not a PCI candidate last year -His troponin is also elevated, delta is strongly pending -As such, this is very likely NSTEMI -He continues to refuse CABG and now also refuses cath -Will admit since the patient has positive troponins  -Cardiology consultation requested -Continue ASA 81 mg daily -NTG for symptom relief (although there is no mortality  benefit) -Continue beta blocker (not in HF or at risk for shock currently) -Supplemental O2 -Recurrent episodes are concerning for progressive disease and indicate a grave prognosis  Acute on chronic combined CHF -Severe CHF at baseline, likely worse currently -10/10 echo with EF worsening to 38-33%, grade 3 diastolic dysfunction -Lasix per cardiology -Spironolactone has been stopped -Comptroller    COVID infection -Patient has been exposed to [place] contacts, SOB overnight with some recent cough -COVID POSITIVE -The stress/demand from the [place] may have contributed to his NSTEMI -He is currently on supplemental O2 and has a mild cough, but his O2 sats have not been documented as lower than 92% -CXR with pulmonary edema -Will start molnupiravir   Stage 4 CKD s/p renal transplant -Continue immunosuppressants - prednisone daily, tacrolimus, mycophenolate, Bactrim for prophylaxis -Renal function actually appears slightly better than usual baseline -Attempt to avoid nephrotoxic medications including lisinopril, Farxiga -Continue sodium bicarbonate   Hyperkalemia -Subacute, likely related to renal dysfunction -No concerning EKG changes -Previously on Lokelma, may need to resume -Stopping spironolactone, per cardiology -Recheck BMP in AM   Afib -Rate controlled with amiodarone, Toprol XL -Continue Eliquis  HTN -Continue beta-blocker, as above -Will also add prn hydralazine   HLD -Continue Lipitor, Zetia   DM -  Last A1c was 6.2, good control -Continue NPH -Will cover with sensitive-scale SSI for now   Anxiety -Continue Xanax   Goals of care -Patient is DNR - I have discussed code status with the patient and his daughters and  they are in agreement that the patient would not desire resuscitation and would prefer to die a natural death should that situation arise. -He will need a gold out of facility DNR form at the time of discharge -He is also aware that be declining  possible intervention for his presumed NSTEMI, he is at significant morbidity/mortality risk -Palliative care has been consulted      Advance Care Planning:   Code Status: DNR   Consults: cardiology; palliative care  DVT Prophylaxis: Eliquis  Family Communication: Daughter was present throughout evaluation  Severity of Illness: The appropriate patient status for this patient is INPATIENT. Inpatient status is judged to be reasonable and necessary in order to provide the required intensity of service to ensure the patient's safety. The patient's presenting symptoms, physical exam findings, and initial radiographic and laboratory data in the context of their chronic comorbidities is felt to place them at high risk for further clinical deterioration. Furthermore, it is not anticipated that the patient will be medically stable for discharge from the hospital within 2 midnights of admission.   * I certify that at the point of admission it is my clinical judgment that the patient will require inpatient hospital care spanning beyond 2 midnights from the point of admission due to high intensity of service, high risk for further deterioration and high frequency of surveillance required.*  Author: Karmen Bongo, MD 01/01/2022 4:56 PM  For on call review [email or web address].

## 2022-01-01 NOTE — ED Triage Notes (Signed)
Pt bib [facility] c/o of CP and SOB that began last night and worsened this morning. Hx NSTEMI, told he needed bypass surgery but what not a candidate due to kidney transplant. EKG showed L BBB. CP improved following 1 nitro + 324 ASA. Also given 4 zofran PTA. Crackles bilaterally lower fields.   BP 148/46 HR 105 RR 18 SPO2 91% RA, 95% 2LNC

## 2022-01-01 NOTE — ED Notes (Signed)
RN assisted pt with urinal at bedside. Output documented

## 2022-01-02 DIAGNOSIS — I5023 Acute on chronic systolic (congestive) heart failure: Secondary | ICD-10-CM | POA: Diagnosis not present

## 2022-01-02 DIAGNOSIS — R072 Precordial pain: Secondary | ICD-10-CM | POA: Diagnosis not present

## 2022-01-02 DIAGNOSIS — I214 Non-ST elevation (NSTEMI) myocardial infarction: Secondary | ICD-10-CM

## 2022-01-02 DIAGNOSIS — Z515 Encounter for palliative care: Secondary | ICD-10-CM | POA: Diagnosis not present

## 2022-01-02 LAB — CBC
HCT: 35.5 % — ABNORMAL LOW (ref 39.0–52.0)
Hemoglobin: 10.6 g/dL — ABNORMAL LOW (ref 13.0–17.0)
MCH: 27.7 pg (ref 26.0–34.0)
MCHC: 29.9 g/dL — ABNORMAL LOW (ref 30.0–36.0)
MCV: 92.7 fL (ref 80.0–100.0)
Platelets: 73 10*3/uL — ABNORMAL LOW (ref 150–400)
RBC: 3.83 MIL/uL — ABNORMAL LOW (ref 4.22–5.81)
RDW: 17 % — ABNORMAL HIGH (ref 11.5–15.5)
WBC: 2.8 10*3/uL — ABNORMAL LOW (ref 4.0–10.5)
nRBC: 0 % (ref 0.0–0.2)

## 2022-01-02 LAB — GLUCOSE, CAPILLARY
Glucose-Capillary: 169 mg/dL — ABNORMAL HIGH (ref 70–99)
Glucose-Capillary: 192 mg/dL — ABNORMAL HIGH (ref 70–99)
Glucose-Capillary: 202 mg/dL — ABNORMAL HIGH (ref 70–99)
Glucose-Capillary: 261 mg/dL — ABNORMAL HIGH (ref 70–99)

## 2022-01-02 LAB — BASIC METABOLIC PANEL
Anion gap: 8 (ref 5–15)
BUN: 53 mg/dL — ABNORMAL HIGH (ref 8–23)
CO2: 23 mmol/L (ref 22–32)
Calcium: 8.3 mg/dL — ABNORMAL LOW (ref 8.9–10.3)
Chloride: 102 mmol/L (ref 98–111)
Creatinine, Ser: 2.14 mg/dL — ABNORMAL HIGH (ref 0.61–1.24)
GFR, Estimated: 31 mL/min — ABNORMAL LOW (ref >60)
Glucose, Bld: 193 mg/dL — ABNORMAL HIGH (ref 70–99)
Potassium: 5.5 mmol/L — ABNORMAL HIGH (ref 3.5–5.1)
Sodium: 133 mmol/L — ABNORMAL LOW (ref 135–145)

## 2022-01-02 MED ORDER — SODIUM ZIRCONIUM CYCLOSILICATE 5 G PO PACK
5.0000 g | PACK | Freq: Once | ORAL | Status: AC
  Administered 2022-01-02: 5 g via ORAL
  Filled 2022-01-02: qty 1

## 2022-01-02 NOTE — Progress Notes (Signed)
PROGRESS NOTE    Daniel Myers  BMW:[id number] DOB: [birth year] DOA: 01/01/2022 PCP: Claretta Fraise, MD  76/M w/ chronic combined CHF; renal transplant; HTN; HLD; severe multivessel CAD (declined CABG); stage 3b CKD; DM; and afib on Eliquis presenting with CP/SOB.  He was last admitted from 11/6-13 with acute combined CHF and afib.  Presented to the ED with acute dyspnea X 1 day, orthopnea, mild cough  ER Course:  h/o CHF, ECHO 20-25%,  Exam and CXR look like CHF.  EKG changes   Subjective: -Feels better, breathing improving, mild cough  Assessment and Plan:  Acute on chronic combined CHF -Known cardiomyopathy, echo 10/23 with EF 20-25% with grade 3 diastolic dysfunction -Continue Lasix and Aldactone, cardiology following -Overall prognosis is poor, palliative care team following as well, plan for medical optimization, somewhat hesitant about hospice this morning  Elevated troponin CAD-severe, multivessel -Cath 8/22 with severe multivessel CAD, patient declined CABG, PCI felt to be of limited benefit -Likely demand ischemia at this time, no plans for further workup, appreciate cardiology input -Continue aspirin, metoprolol, statin  COVID infection -Mild, no x-ray changes, continue molnupiravir day 2/5  CKD 4 Renal transplant -Renal function relatively stable, continue immunosuppressants-including prednisone, tacrolimus, mycophenolate and Bactrim for prophylaxis  Paroxysmal A-fib -Rate controlled, continue Toprol, amiodarone, Eliquis  Type 2 diabetes mellitus -Continue NPH, last A1c was 6.2, sensitive sliding scale  Hyperkalemia -Lokelma, repeat labs  DVT prophylaxis: Eliquis Code Status: DNR Family Communication: None present, discussed patient detail Disposition Plan: To be determined  Consultants: Cards, palliative care   Procedures:   Antimicrobials:    Objective: Vitals:   01/01/22 1949 01/02/22 0043 01/02/22 0404 01/02/22 0842  BP: 123/69 125/66 132/66  123/60  Pulse: 69 70 83 (!) 103  Resp:  '20 17 18  '$ Temp: 97.8 F (36.6 C) 99 F (37.2 C) 99.2 F (37.3 C) 99.6 F (37.6 C)  TempSrc: Oral Oral Oral Oral  SpO2: 98% 97% 96% 94%  Weight:  91.2 kg    Height:        Intake/Output Summary (Last 24 hours) at 01/02/2022 1056 Last data filed at 01/02/2022 2725 Gross per 24 hour  Intake 700 ml  Output 575 ml  Net 125 ml   Filed Weights   01/01/22 0542 01/01/22 1757 01/02/22 0043  Weight: 85.7 kg 89.4 kg 91.2 kg    Examination:  General exam: Chronically ill male sitting up in bed, AAOx3, no distress  HEENT: Positive JVD CVS: S1-S2, regular rhythm Lungs: Bilateral Rales Abdomen: Soft, nontender, bowel sounds present Extremities: 1+ edema  Skin: No rashes Psychiatry:  Mood & affect appropriate.     Data Reviewed:   CBC: Recent Labs  Lab 12/28/21 1615 01/01/22 0547 01/01/22 0555 01/02/22 0057  WBC 3.4 4.9  --  2.8*  NEUTROABS 2.9 4.3  --   --   HGB 11.1* 12.0* 13.6 10.6*  HCT 36.3* 40.5 40.0 35.5*  MCV 91 93.5  --  92.7  PLT 152 112*  --  73*   Basic Metabolic Panel: Recent Labs  Lab 12/28/21 1615 01/01/22 0547 01/01/22 0555 01/02/22 0057  NA 138 137 134* 133*  K 4.9 5.2* 5.1 5.5*  CL 104 100 103 102  CO2 20 22  --  23  GLUCOSE 184* 216* 217* 193*  BUN 43* 48* 44* 53*  CREATININE 1.98* 2.11* 2.20* 2.14*  CALCIUM 8.0* 8.5*  --  8.3*  MG  --  2.1  --   --  GFR: Estimated Creatinine Clearance: 33.9 mL/min (A) (by C-G formula based on SCr of 2.14 mg/dL (H)). Liver Function Tests: Recent Labs  Lab 12/28/21 1615 01/01/22 0547  AST 17 23  ALT 10 15  ALKPHOS 82 70  BILITOT 0.8 1.3*  PROT 5.3* 6.0*  ALBUMIN 3.6* 3.5   No results for input(s): "LIPASE", "AMYLASE" in the last 168 hours. No results for input(s): "AMMONIA" in the last 168 hours. Coagulation Profile: Recent Labs  Lab 01/01/22 0547  INR 1.5*   Cardiac Enzymes: No results for input(s): "CKTOTAL", "CKMB", "CKMBINDEX", "TROPONINI" in  the last 168 hours. BNP (last 3 results) No results for input(s): "PROBNP" in the last 8760 hours. HbA1C: No results for input(s): "HGBA1C" in the last 72 hours. CBG: Recent Labs  Lab 01/01/22 1155 01/01/22 1824 01/01/22 2134 01/02/22 0601  GLUCAP 206* 232* 246* 169*   Lipid Profile: No results for input(s): "CHOL", "HDL", "LDLCALC", "TRIG", "CHOLHDL", "LDLDIRECT" in the last 72 hours. Thyroid Function Tests: No results for input(s): "TSH", "T4TOTAL", "FREET4", "T3FREE", "THYROIDAB" in the last 72 hours. Anemia Panel: No results for input(s): "VITAMINB12", "FOLATE", "FERRITIN", "TIBC", "IRON", "RETICCTPCT" in the last 72 hours. Urine analysis:    Component Value Date/Time   COLORURINE YELLOW 12/18/2021 0748   APPEARANCEUR CLOUDY (A) 12/18/2021 0748   APPEARANCEUR Clear 04/17/2020 1629   LABSPEC 1.009 12/18/2021 0748   PHURINE 7.0 12/18/2021 0748   GLUCOSEU NEGATIVE 12/18/2021 0748   HGBUR MODERATE (A) 12/18/2021 0748   BILIRUBINUR NEGATIVE 12/18/2021 0748   BILIRUBINUR Negative 04/17/2020 [place] 12/18/2021 0748   PROTEINUR 100 (A) 12/18/2021 0748   NITRITE NEGATIVE 12/18/2021 0748   LEUKOCYTESUR LARGE (A) 12/18/2021 0748   Sepsis Labs: '@LABRCNTIP'$ (procalcitonin:4,lacticidven:4)  ) Recent Results (from the past 240 hour(s))  Resp Panel by RT-PCR (Flu A&B, Covid) Anterior Nasal Swab     Status: Abnormal   Collection Time: 01/01/22  8:01 AM   Specimen: Anterior Nasal Swab  Result Value Ref Range Status   SARS Coronavirus 2 by RT PCR POSITIVE (A) NEGATIVE Final    Comment: (NOTE) SARS-CoV-2 target nucleic acids are DETECTED.  The SARS-CoV-2 RNA is generally detectable in upper respiratory specimens during the acute phase of infection. Positive results are indicative of the presence of the identified virus, but do not rule out bacterial infection or co-infection with other pathogens not detected by the test. Clinical correlation with patient history  and other diagnostic information is necessary to determine patient infection status. The expected result is Negative.  Fact Sheet for Patients: EntrepreneurPulse.com.au  Fact Sheet for Healthcare Providers: IncredibleEmployment.be  This test is not yet approved or cleared by the [place] FDA and  has been authorized for detection and/or diagnosis of SARS-CoV-2 by FDA under an Emergency Use Authorization (EUA).  This EUA will remain in effect (meaning this test can be used) for the duration of  the COVID-19 declaration under Section 564(b)(1) of the A ct, 21 U.S.C. section 360bbb-3(b)(1), unless the authorization is terminated or revoked sooner.     Influenza A by PCR NEGATIVE NEGATIVE Final   Influenza B by PCR NEGATIVE NEGATIVE Final    Comment: (NOTE) The Xpert Xpress SARS-CoV-2/FLU/RSV plus assay is intended as an aid in the diagnosis of influenza from Nasopharyngeal swab specimens and should not be used as a sole basis for treatment. Nasal washings and aspirates are unacceptable for Xpert Xpress SARS-CoV-2/FLU/RSV testing.  Fact Sheet for Patients: EntrepreneurPulse.com.au  Fact Sheet for Healthcare Providers: IncredibleEmployment.be  This  test is not yet approved or cleared by the [place] and has been authorized for detection and/or diagnosis of SARS-CoV-2 by FDA under an Emergency Use Authorization (EUA). This EUA will remain in effect (meaning this test can be used) for the duration of the COVID-19 declaration under Section 564(b)(1) of the Act, 21 U.S.C. section 360bbb-3(b)(1), unless the authorization is terminated or revoked.  Performed at [facility], [place] [address], [place], [place] [address]      Radiology Studies: DG Chest Portable 1 View  Result Date: 01/01/2022 CLINICAL DATA:  Shortness of breath.  Chest pain. EXAM: PORTABLE CHEST 1 VIEW COMPARISON:   12/14/2021 FINDINGS: Cardiac enlargement. There is diffuse increase interstitial markings identified bilaterally concerning for pulmonary edema. No airspace opacity scratch set no airspace consolidation. No signs of pleural effusion. IMPRESSION: Cardiac enlargement and pulmonary edema. Correlate for any signs or symptoms of CHF. Electronically Signed   By: Kerby Moors M.D.   On: 01/01/2022 06:41     Scheduled Meds:  amiodarone  100 mg Oral Daily   apixaban  5 mg Oral BID   aspirin EC  81 mg Oral Daily   atorvastatin  40 mg Oral Daily   ezetimibe  10 mg Oral Daily   furosemide  80 mg Intravenous BID   insulin aspart  0-15 Units Subcutaneous TID WC   insulin NPH Human  10 Units Subcutaneous BID AC & HS   metoprolol succinate  25 mg Oral Daily   molnupiravir EUA  4 capsule Oral BID   mycophenolate  500 mg Oral BID   predniSONE  5 mg Oral Q breakfast   sodium chloride flush  3 mL Intravenous Q12H   tacrolimus  1 mg Oral BID   Vericiguat  2.5 mg Oral Daily   Continuous Infusions:  sodium chloride       LOS: 1 day    Time spent: 33mn    PDomenic Polite MD [facility]   01/02/2022, 10:56 AM

## 2022-01-02 NOTE — Progress Notes (Signed)
Daily Progress Note   Patient Name: Daniel Myers       Date: 01/02/2022 DOB: 1945/05/17  Age: 76 y.o. MRN#: [id number] Attending Physician: Domenic Polite, MD Primary Care Physician: Claretta Fraise, MD Admit Date: 01/01/2022  Reason for Consultation/Follow-up: Establishing goals of care  Subjective: Chart review performed.  Received report from primary RN -no acute concerns.  RN does note that patient had increased shortness of breath this morning, respiratory rate ranging from 20-40 - lasix was administered with improvement of symptoms.  Went to visit patient at bedside -daughter/[name] was present.  Patient was sitting up in chair awake, alert, oriented, and able to participate in conversation. No signs or non-verbal gestures of pain or discomfort noted. No respiratory distress, increased work of breathing, or secretions noted.  Patient does endorse mild shortness of breath; however, tells me it has improved from this morning.  He is on 2 L O2 nasal cannula.  Telemetry leads replaced - they were falling off.  Emotional support provided to patient and his daughter.  Patient tells me he has continued to think about our conversation from yesterday, including thoughts on transition to comfort measures and discharge with hospice.  Patient tells me that he "really has to consider things like this."  Validated that this is a very big decision that requires consideration.  Discussed patient's insurance in context of hospice benefit to the best of my ability per patient's request.  Reviewed with patient the no matter which decision he makes (aggressive versus comfort/hospice) his prognosis remains poor, and life expectancy is short.  Encourage patient to think about how he would want to spend his last weeks  to months -in and out of the hospital versus focusing on comfort/quality of life, spending more time at home.  Patient and daughter understand that medical recommendation is for discharge with hospice care.    Provided again education and counseling at length on the philosophy and benefits of hospice care per his request. Discussed that it offers a holistic approach to care in the setting of end-stage illness, and is about supporting the patient where they are allowing nature to take it's course. Discussed the hospice team includes RNs, physicians, social workers, and chaplains. They can provide personal care, support for the family, and help keep patient out of the hospital as well as  assist with DME needs for home hospice.   Patient is still not ready to make final decisions around hospice care today and requests additional time to consider information.  Patient is open to speaking with hospice liaison to get more information.   All questions and concerns addressed. Encouraged to call with questions and/or concerns. PMT card previously provided.  Length of Stay: 1  Current Medications: Scheduled Meds:   amiodarone  100 mg Oral Daily   apixaban  5 mg Oral BID   aspirin EC  81 mg Oral Daily   atorvastatin  40 mg Oral Daily   ezetimibe  10 mg Oral Daily   furosemide  80 mg Intravenous BID   insulin aspart  0-15 Units Subcutaneous TID WC   insulin NPH Human  10 Units Subcutaneous BID AC & HS   metoprolol succinate  25 mg Oral Daily   molnupiravir EUA  4 capsule Oral BID   mycophenolate  500 mg Oral BID   predniSONE  5 mg Oral Q breakfast   sodium chloride flush  3 mL Intravenous Q12H   tacrolimus  1 mg Oral BID   Vericiguat  2.5 mg Oral Daily    Continuous Infusions:  sodium chloride      PRN Meds: sodium chloride, acetaminophen, albuterol, ALPRAZolam, nitroGLYCERIN, ondansetron (ZOFRAN) IV, sodium chloride flush  Physical Exam Vitals and nursing note reviewed.  Constitutional:       General: He is not in acute distress. Pulmonary:     Effort: No respiratory distress.  Skin:    General: Skin is warm and dry.  Neurological:     Mental Status: He is alert and oriented to person, place, and time.     Motor: Weakness present.  Psychiatric:        Attention and Perception: Attention normal.        Behavior: Behavior is cooperative.        Cognition and Memory: Cognition and memory normal.             Vital Signs: BP 123/60 (BP Location: Left Arm)   Pulse (!) 103   Temp 99.6 F (37.6 C) (Oral)   Resp 18   Ht '5\' 10"'$  (1.778 m)   Wt 91.2 kg   SpO2 94%   BMI 28.85 kg/m  SpO2: SpO2: 94 % O2 Device: O2 Device: Nasal Cannula O2 Flow Rate: O2 Flow Rate (L/min): 2 L/min  Intake/output summary:  Intake/Output Summary (Last 24 hours) at 01/02/2022 1116 Last data filed at 01/02/2022 4196 Gross per 24 hour  Intake 700 ml  Output 575 ml  Net 125 ml   LBM: Last BM Date : 12/31/21 Baseline Weight: Weight: 85.7 kg Most recent weight: Weight: 91.2 kg       Palliative Assessment/Data: PPS 50-60%      Patient Active Problem List   Diagnosis Date Noted   Acute on chronic combined systolic (congestive) and diastolic (congestive) heart failure ([place]) 01/01/2022   Paroxysmal A-fib ([place]) 12/17/2021   Acute on chronic congestive heart failure ([place]) 12/15/2021   Weakness 12/14/2021   CAD (coronary artery disease)/Prior MI 11/17/2021   Obesity (BMI 30-39.9) 11/17/2021   NSTEMI (non-ST elevated myocardial infarction) ([place]) 11/16/2021   Hyperkalemia 11/16/2021   Goals of care, counseling/discussion 11/16/2021   DNR (do not resuscitate) 11/16/2021   Cerebrovascular accident (CVA) ([place]) 12/19/2020   Persistent atrial fibrillation ([place]) 03/11/2020   Acute on chronic combined systolic and diastolic CHF (congestive heart failure) ([place]) 02/13/2020   Thrombocytopenia (  [place]) 02/13/2020   COVID-19 virus infection 02/12/2020   CKD (chronic kidney disease) stage 4, GFR 15-29  ml/min (HCC) 02/12/2020   Immunosuppression ([place]) 01/22/2020   Proteinuria 01/22/2020   Essential hypertension 11/22/2018   Insulin dependent type 2 diabetes mellitus ([place]) 11/22/2018   Renal transplant recipient 11/22/2018   Hyperlipidemia 11/22/2018   Vision decreased 11/22/2018   Hyponatremia 11/22/2018   Seasonal allergic rhinitis due to pollen 11/22/2018   Diabetic nephropathy associated with type 2 diabetes mellitus ([place]) 05/04/2016   Uncontrolled type 2 diabetes mellitus with hyperglycemia ([place]) 10/29/2015   Immunosuppressive management encounter following kidney transplant 10/07/2015   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 male  with past medical history of chronic combined CHF (declined AICD), CKD IV s/p renal transplant, HTN, HLD, CAD (declined CABG), stage 3b CKD, DM, and afib on Eliquis presented to the ED on 01/01/22 from home with complaints of chest pain and shortness of breath. Patient was admitted on 01/01/2022 with acute on chronic combined systolic and diastolic heart failure, ischemic cardiomyopathy, possible NSTEMI, COVID+.   Of note, patient has been hospitalized 3 times in the last 6 months and is a 30 day readmission. Most recent hospitalization was 11/6-11/13/23 for treatment of heart failure.   Assessment: Principal Problem:   NSTEMI (non-ST elevated myocardial infarction) ([place]) Active Problems:   Essential hypertension   Insulin dependent type 2 diabetes mellitus ([place])   Renal transplant recipient   Hyperlipidemia   COVID-19 virus infection   CKD (chronic kidney disease) stage 4, GFR 15-29 ml/min (HCC)   Persistent atrial fibrillation (HCC)   Hyperkalemia   DNR (do not resuscitate)   Acute on chronic combined systolic (congestive) and diastolic (congestive) heart failure ([place])   Concern about  end-of-life  Recommendations/Plan: Continue to medically optimize Continue DNR/DNI as previously documented Patient still not ready to make final decisions around discharging with hospice today.  He is agreeable to speak with hospice liaison to get more information - TOC notified and consult placed No invasive procedures Daughter/[name] [name] to be surrogate decision maker if ever needed PMT will continue to follow and support holistically  Goals of Care and Additional Recommendations: Limitations on Scope of Treatment: Full Scope Treatment, No Surgical Procedures, and No Tracheostomy  Code Status:    Code Status Orders  (From admission, onward)           Start     Ordered   01/01/22 0851  Do not attempt resuscitation (DNR)  Continuous       Question Answer Comment  In the event of cardiac or respiratory ARREST Do not call a "code blue"   In the event of cardiac or respiratory ARREST Do not perform Intubation, CPR, defibrillation or ACLS   In the event of cardiac or respiratory ARREST Use medication by any route, position, wound care, and other measures to relive pain and suffering. May use oxygen, suction and manual treatment of airway obstruction as needed for comfort.      01/01/22 0854           Code Status History     Date Active Date Inactive Code Status Order ID Comments User Context   12/14/2021 1544 12/21/2021 1655 DNR [id number]  Heath Lark D, DO ED   11/16/2021 2210 11/20/2021 1751 DNR [id number]  Karmen Bongo, MD ED   09/09/2020 1207  09/09/2020 1940 Full Code [id number]  Larey Dresser, MD Inpatient   02/12/2020 0539 02/18/2020 1913 Full Code [id number]  Bernadette Hoit, DO Inpatient   02/12/2020 0350 02/12/2020 0539 Full Code [id number]  Bernadette Hoit, DO ED       Prognosis:  < 6 months  Discharge Planning: To Be Determined  Care plan was discussed with primary RN, patient, patient's daughter, TOC, Dr. Broadus John  Thank you for allowing the Palliative  Medicine Team to assist in the care of this patient.   Total Time 50 minutes Prolonged Time Billed  no       Greater than 50%  of this time was spent counseling and coordinating care related to the above assessment and plan.  Lin Landsman, NP  Please contact Palliative Medicine Team phone at [phone] for questions and concerns.   *Portions of this note are a verbal dictation therefore any spelling and/or grammatical errors are due to the "[employer]" system interpretation.

## 2022-01-02 NOTE — Plan of Care (Signed)
  Problem: Education: Goal: Ability to describe self-care measures that may prevent or decrease complications (Diabetes Survival Skills Education) will improve Outcome: Progressing   Problem: Coping: Goal: Ability to adjust to condition or change in health will improve Outcome: Progressing   Problem: Nutritional: Goal: Maintenance of adequate nutrition will improve Outcome: Progressing   Problem: Tissue Perfusion: Goal: Adequacy of tissue perfusion will improve Outcome: Progressing

## 2022-01-03 DIAGNOSIS — U071 COVID-19: Secondary | ICD-10-CM | POA: Diagnosis not present

## 2022-01-03 DIAGNOSIS — I4819 Other persistent atrial fibrillation: Secondary | ICD-10-CM

## 2022-01-03 DIAGNOSIS — I214 Non-ST elevation (NSTEMI) myocardial infarction: Secondary | ICD-10-CM | POA: Diagnosis not present

## 2022-01-03 DIAGNOSIS — Z940 Kidney transplant status: Secondary | ICD-10-CM

## 2022-01-03 DIAGNOSIS — I5023 Acute on chronic systolic (congestive) heart failure: Secondary | ICD-10-CM | POA: Diagnosis not present

## 2022-01-03 LAB — GLUCOSE, CAPILLARY
Glucose-Capillary: 130 mg/dL — ABNORMAL HIGH (ref 70–99)
Glucose-Capillary: 156 mg/dL — ABNORMAL HIGH (ref 70–99)
Glucose-Capillary: 191 mg/dL — ABNORMAL HIGH (ref 70–99)
Glucose-Capillary: 234 mg/dL — ABNORMAL HIGH (ref 70–99)

## 2022-01-03 LAB — CBC
HCT: 30.1 % — ABNORMAL LOW (ref 39.0–52.0)
Hemoglobin: 9.7 g/dL — ABNORMAL LOW (ref 13.0–17.0)
MCH: 29.1 pg (ref 26.0–34.0)
MCHC: 32.2 g/dL (ref 30.0–36.0)
MCV: 90.4 fL (ref 80.0–100.0)
Platelets: 60 10*3/uL — ABNORMAL LOW (ref 150–400)
RBC: 3.33 MIL/uL — ABNORMAL LOW (ref 4.22–5.81)
RDW: 16.8 % — ABNORMAL HIGH (ref 11.5–15.5)
WBC: 2.4 10*3/uL — ABNORMAL LOW (ref 4.0–10.5)
nRBC: 0 % (ref 0.0–0.2)

## 2022-01-03 LAB — BASIC METABOLIC PANEL
Anion gap: 8 (ref 5–15)
BUN: 61 mg/dL — ABNORMAL HIGH (ref 8–23)
CO2: 22 mmol/L (ref 22–32)
Calcium: 7.8 mg/dL — ABNORMAL LOW (ref 8.9–10.3)
Chloride: 100 mmol/L (ref 98–111)
Creatinine, Ser: 2.35 mg/dL — ABNORMAL HIGH (ref 0.61–1.24)
GFR, Estimated: 28 mL/min — ABNORMAL LOW (ref >60)
Glucose, Bld: 212 mg/dL — ABNORMAL HIGH (ref 70–99)
Potassium: 4.8 mmol/L (ref 3.5–5.1)
Sodium: 130 mmol/L — ABNORMAL LOW (ref 135–145)

## 2022-01-03 NOTE — Progress Notes (Signed)
PROGRESS NOTE    Daniel Myers  WPY:[id number] DOB: Sep 06, 1945 DOA: 01/01/2022 PCP: Claretta Fraise, MD  76/M w/ chronic combined CHF; renal transplant; HTN; HLD; severe multivessel CAD (declined CABG); stage 3b CKD; DM; and afib on Eliquis presenting with CP/SOB.  He was last admitted from 11/6-13 with acute combined CHF and afib.  Presented to the ED with acute dyspnea X 1 day, orthopnea, mild cough  ER Course:  h/o CHF, ECHO 20-25%,  Exam and CXR look like CHF.  EKG changes  -Admitted, started on diuretics and molnupiravir, seen by cardiology, palliative consult recommended  Subjective: -Feels better over all, breathing better,'s mild cough  Assessment and Plan:  Acute on chronic combined CHF -Known cardiomyopathy, echo 10/23 with EF 20-25% with grade 3 diastolic dysfunction -Continue Lasix and Aldactone, cardiology following -I/O inaccurate, weight improving, mild bump in creatinine -Overall prognosis is poor, palliative care team following as well, plan for medical optimization, further discussions regarding hospice today  Elevated troponin CAD-severe, multivessel -Cath 8/22 with severe multivessel CAD, patient declined CABG, PCI felt to be of limited benefit -Likely demand ischemia at this time, no plans for further workup, appreciate cardiology input -Continue aspirin, metoprolol, statin  COVID infection -Mild, no x-ray changes, continue molnupiravir day 3/5  CKD 4 Renal transplant -Renal function relatively stable,  -continue immunosuppressants- prednisone, tacrolimus, mycophenolate and Bactrim for prophylaxis  Paroxysmal A-fib -Rate controlled, continue Toprol, amiodarone, Eliquis  Type 2 diabetes mellitus -Continue NPH, last A1c was 6.2, sensitive sliding scale  Hyperkalemia -Given Lokelma 11/25, improved  DVT prophylaxis: Eliquis Code Status: DNR Family Communication: None present, discussed patient detail Disposition Plan: To be determined  Consultants:  Cards, palliative care   Procedures:   Antimicrobials:    Objective: Vitals:   01/03/22 0140 01/03/22 0447 01/03/22 0843 01/03/22 0907  BP:  105/61 (!) 99/57 (!) 110/54  Pulse:   66 68  Resp:  18 20   Temp:  97.6 F (36.4 C) 97.8 F (36.6 C)   TempSrc:  Oral Oral   SpO2:  100% 96%   Weight: 89.4 kg     Height:        Intake/Output Summary (Last 24 hours) at 01/03/2022 1002 Last data filed at 01/03/2022 0912 Gross per 24 hour  Intake 723 ml  Output 1701 ml  Net -978 ml   Filed Weights   01/01/22 1757 01/02/22 0043 01/03/22 0140  Weight: 89.4 kg 91.2 kg 89.4 kg    Examination:  General exam: Chronically ill male laying in bed, AAOx3, no distress HEENT: Positive JVD CVS: S1-S2, regular rhythm Lungs: Poor air movement bilaterally, scant Rales Abdomen: Soft, nontender, bowel sounds present Extremities: Trace edema Skin: No rashes Psychiatry:  Mood & affect appropriate.   Data Reviewed:   CBC: Recent Labs  Lab 12/28/21 1615 01/01/22 0547 01/01/22 0555 01/02/22 0057 01/03/22 0056  WBC 3.4 4.9  --  2.8* 2.4*  NEUTROABS 2.9 4.3  --   --   --   HGB 11.1* 12.0* 13.6 10.6* 9.7*  HCT 36.3* 40.5 40.0 35.5* 30.1*  MCV 91 93.5  --  92.7 90.4  PLT 152 112*  --  73* 60*   Basic Metabolic Panel: Recent Labs  Lab 12/28/21 1615 01/01/22 0547 01/01/22 0555 01/02/22 0057 01/03/22 0056  NA 138 137 134* 133* 130*  K 4.9 5.2* 5.1 5.5* 4.8  CL 104 100 103 102 100  CO2 20 22  --  23 22  GLUCOSE 184* 216* 217* 193* 212*  BUN 43* 48* 44* 53* 61*  CREATININE 1.98* 2.11* 2.20* 2.14* 2.35*  CALCIUM 8.0* 8.5*  --  8.3* 7.8*  MG  --  2.1  --   --   --    GFR: Estimated Creatinine Clearance: 30.6 mL/min (A) (by C-G formula based on SCr of 2.35 mg/dL (H)). Liver Function Tests: Recent Labs  Lab 12/28/21 1615 01/01/22 0547  AST 17 23  ALT 10 15  ALKPHOS 82 70  BILITOT 0.8 1.3*  PROT 5.3* 6.0*  ALBUMIN 3.6* 3.5   No results for input(s): "LIPASE", "AMYLASE" in  the last 168 hours. No results for input(s): "AMMONIA" in the last 168 hours. Coagulation Profile: Recent Labs  Lab 01/01/22 0547  INR 1.5*   Cardiac Enzymes: No results for input(s): "CKTOTAL", "CKMB", "CKMBINDEX", "TROPONINI" in the last 168 hours. BNP (last 3 results) No results for input(s): "PROBNP" in the last 8760 hours. HbA1C: No results for input(s): "HGBA1C" in the last 72 hours. CBG: Recent Labs  Lab 01/02/22 0601 01/02/22 1139 01/02/22 1712 01/02/22 2105 01/03/22 0630  GLUCAP 169* 192* 202* 261* 130*   Lipid Profile: No results for input(s): "CHOL", "HDL", "LDLCALC", "TRIG", "CHOLHDL", "LDLDIRECT" in the last 72 hours. Thyroid Function Tests: No results for input(s): "TSH", "T4TOTAL", "FREET4", "T3FREE", "THYROIDAB" in the last 72 hours. Anemia Panel: No results for input(s): "VITAMINB12", "FOLATE", "FERRITIN", "TIBC", "IRON", "RETICCTPCT" in the last 72 hours. Urine analysis:    Component Value Date/Time   COLORURINE YELLOW 12/18/2021 0748   APPEARANCEUR CLOUDY (A) 12/18/2021 0748   APPEARANCEUR Clear 04/17/2020 1629   LABSPEC 1.009 12/18/2021 0748   PHURINE 7.0 12/18/2021 0748   GLUCOSEU NEGATIVE 12/18/2021 0748   HGBUR MODERATE (A) 12/18/2021 0748   BILIRUBINUR NEGATIVE 12/18/2021 0748   BILIRUBINUR Negative 04/17/2020 [place] 12/18/2021 0748   PROTEINUR 100 (A) 12/18/2021 0748   NITRITE NEGATIVE 12/18/2021 0748   LEUKOCYTESUR LARGE (A) 12/18/2021 0748   Sepsis Labs: '@LABRCNTIP'$ (procalcitonin:4,lacticidven:4)  ) Recent Results (from the past 240 hour(s))  Resp Panel by RT-PCR (Flu A&B, Covid) Anterior Nasal Swab     Status: Abnormal   Collection Time: 01/01/22  8:01 AM   Specimen: Anterior Nasal Swab  Result Value Ref Range Status   SARS Coronavirus 2 by RT PCR POSITIVE (A) NEGATIVE Final    Comment: (NOTE) SARS-CoV-2 target nucleic acids are DETECTED.  The SARS-CoV-2 RNA is generally detectable in upper  respiratory specimens during the acute phase of infection. Positive results are indicative of the presence of the identified virus, but do not rule out bacterial infection or co-infection with other pathogens not detected by the test. Clinical correlation with patient history and other diagnostic information is necessary to determine patient infection status. The expected result is Negative.  Fact Sheet for Patients: EntrepreneurPulse.com.au  Fact Sheet for Healthcare Providers: IncredibleEmployment.be  This test is not yet approved or cleared by the [place] FDA and  has been authorized for detection and/or diagnosis of SARS-CoV-2 by FDA under an Emergency Use Authorization (EUA).  This EUA will remain in effect (meaning this test can be used) for the duration of  the COVID-19 declaration under Section 564(b)(1) of the A ct, 21 U.S.C. section 360bbb-3(b)(1), unless the authorization is terminated or revoked sooner.     Influenza A by PCR NEGATIVE NEGATIVE Final   Influenza B by PCR NEGATIVE NEGATIVE Final    Comment: (NOTE) The Xpert Xpress SARS-CoV-2/FLU/RSV plus assay is intended as an aid in the diagnosis of  influenza from Nasopharyngeal swab specimens and should not be used as a sole basis for treatment. Nasal washings and aspirates are unacceptable for Xpert Xpress SARS-CoV-2/FLU/RSV testing.  Fact Sheet for Patients: EntrepreneurPulse.com.au  Fact Sheet for Healthcare Providers: IncredibleEmployment.be  This test is not yet approved or cleared by the [place] FDA and has been authorized for detection and/or diagnosis of SARS-CoV-2 by FDA under an Emergency Use Authorization (EUA). This EUA will remain in effect (meaning this test can be used) for the duration of the COVID-19 declaration under Section 564(b)(1) of the Act, 21 U.S.C. section 360bbb-3(b)(1), unless the authorization is  terminated or revoked.  Performed at [facility], [place] [address], [place], [place] [address]      Radiology Studies: No results found.   Scheduled Meds:  amiodarone  100 mg Oral Daily   apixaban  5 mg Oral BID   atorvastatin  40 mg Oral Daily   ezetimibe  10 mg Oral Daily   furosemide  80 mg Intravenous BID   insulin aspart  0-15 Units Subcutaneous TID WC   insulin NPH Human  10 Units Subcutaneous BID AC & HS   metoprolol succinate  25 mg Oral Daily   molnupiravir EUA  4 capsule Oral BID   mycophenolate  500 mg Oral BID   predniSONE  5 mg Oral Q breakfast   sodium chloride flush  3 mL Intravenous Q12H   tacrolimus  1 mg Oral BID   Vericiguat  2.5 mg Oral Daily   Continuous Infusions:  sodium chloride       LOS: 2 days    Time spent: 34mn    PDomenic Polite MD [facility]   01/03/2022, 10:02 AM

## 2022-01-03 NOTE — Social Work (Signed)
CSW spoke with pt via phone to address the hospice consult for pt wanting more information. CSW explained the reason for the call and pt stated that he did not want to speak with anyone. CSW asked again to confirm that not speaking with anyone was his request. Pt confirmed the request to not speak with anyone.CSW alerted palliative. Consult has been closed.

## 2022-01-03 NOTE — Progress Notes (Signed)
Patient ID: Daniel Myers, male   DOB: 09-07-1945, 76 y.o.   MRN: [id number]     Advanced Heart Failure Rounding Note  PCP-Cardiologist: Rozann Lesches, MD   Subjective:    I/Os mildly negative, weight trending down.  Weight down 4 lbs.     Objective:   Weight Range: 89.4 kg Body mass index is 28.27 kg/m.   Vital Signs:   Temp:  [97.6 F (36.4 C)-99.6 F (37.6 C)] 97.6 F (36.4 C) (11/26 0447) Pulse Rate:  [68-103] 68 (11/25 1949) Resp:  [18-20] 18 (11/26 0447) BP: (105-123)/(52-61) 105/61 (11/26 0447) SpO2:  [94 %-100 %] 100 % (11/26 0447) Weight:  [89.4 kg] 89.4 kg (11/26 0140) Last BM Date : 01/02/22  Weight change: Filed Weights   01/01/22 1757 01/02/22 0043 01/03/22 0140  Weight: 89.4 kg 91.2 kg 89.4 kg    Intake/Output:   Intake/Output Summary (Last 24 hours) at 01/03/2022 0823 Last data filed at 01/03/2022 0455 Gross per 24 hour  Intake 720 ml  Output 1701 ml  Net -981 ml      Physical Exam    General:  Well appearing. No resp difficulty HEENT: Normal Neck: Supple. JVP 12 cm. Carotids 2+ bilat; no bruits. No lymphadenopathy or thyromegaly appreciated. Cor: PMI nondisplaced. Regular rate & rhythm. No rubs, gallops or murmurs. Lungs: Clear Abdomen: Soft, nontender, nondistended. No hepatosplenomegaly. No bruits or masses. Good bowel sounds. Extremities: No cyanosis, clubbing, rash, edema Neuro: Alert & orientedx3, cranial nerves grossly intact. moves all 4 extremities w/o difficulty. Affect pleasant   Telemetry   NSR 60s (personally reviewed)  Labs    CBC Recent Labs    01/01/22 0547 01/01/22 0555 01/02/22 0057 01/03/22 0056  WBC 4.9  --  2.8* 2.4*  NEUTROABS 4.3  --   --   --   HGB 12.0*   < > 10.6* 9.7*  HCT 40.5   < > 35.5* 30.1*  MCV 93.5  --  92.7 90.4  PLT 112*  --  73* 60*   < > = values in this interval not displayed.   Basic Metabolic Panel Recent Labs    01/01/22 0547 01/01/22 0555 01/02/22 0057 01/03/22 0056  NA 137    < > 133* 130*  K 5.2*   < > 5.5* 4.8  CL 100   < > 102 100  CO2 22  --  23 22  GLUCOSE 216*   < > 193* 212*  BUN 48*   < > 53* 61*  CREATININE 2.11*   < > 2.14* 2.35*  CALCIUM 8.5*  --  8.3* 7.8*  MG 2.1  --   --   --    < > = values in this interval not displayed.   Liver Function Tests Recent Labs    01/01/22 0547  AST 23  ALT 15  ALKPHOS 70  BILITOT 1.3*  PROT 6.0*  ALBUMIN 3.5   No results for input(s): "LIPASE", "AMYLASE" in the last 72 hours. Cardiac Enzymes No results for input(s): "CKTOTAL", "CKMB", "CKMBINDEX", "TROPONINI" in the last 72 hours.  BNP: BNP (last 3 results) Recent Labs    12/14/21 1101 12/28/21 1615 01/01/22 0547  BNP >4,500.0* >5000.0* 3,800.9*    ProBNP (last 3 results) No results for input(s): "PROBNP" in the last 8760 hours.   D-Dimer No results for input(s): "DDIMER" in the last 72 hours. Hemoglobin A1C No results for input(s): "HGBA1C" in the last 72 hours. Fasting Lipid Panel No results for input(s): "CHOL", "  HDL", "LDLCALC", "TRIG", "CHOLHDL", "LDLDIRECT" in the last 72 hours. Thyroid Function Tests No results for input(s): "TSH", "T4TOTAL", "T3FREE", "THYROIDAB" in the last 72 hours.  Invalid input(s): "FREET3"  Other results:   Imaging    No results found.   Medications:     Scheduled Medications:  amiodarone  100 mg Oral Daily   apixaban  5 mg Oral BID   aspirin EC  81 mg Oral Daily   atorvastatin  40 mg Oral Daily   ezetimibe  10 mg Oral Daily   furosemide  80 mg Intravenous BID   insulin aspart  0-15 Units Subcutaneous TID WC   insulin NPH Human  10 Units Subcutaneous BID AC & HS   metoprolol succinate  25 mg Oral Daily   molnupiravir EUA  4 capsule Oral BID   mycophenolate  500 mg Oral BID   predniSONE  5 mg Oral Q breakfast   sodium chloride flush  3 mL Intravenous Q12H   tacrolimus  1 mg Oral BID   Vericiguat  2.5 mg Oral Daily    Infusions:  sodium chloride      PRN Medications: sodium  chloride, acetaminophen, albuterol, ALPRAZolam, nitroGLYCERIN, ondansetron (ZOFRAN) IV, sodium chloride flush   Assessment/Plan   1. Acute on chronic systolic CHF: Echo in 9/92 with EF 25-30%.   Patient had NSTEMI during 1/22 admission with COVID-19, not cathed with CKD.  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 RHC in 8/22 with preserved cardiac output and low filling pressures.  Echo 10/23 during admission for NSTEMI showed EF worse 20-25%, RV mildly decreased. He was admitted for CHF earlier this month at [facility]. Back again with CHF and COVID-19.  Still volume overloaded on exam today, seems to be losing weight on current Lasix dose but creatinine trending up at 2.35.  - Will leave off spironolactone with hyperkalemia this admission.  - Lasix 80 mg IV bid for 1 more day.  - Off ACEI/ARNI with hyperkalemia and renal dysfunction.  - Continue Toprol XL 25 mg daily (on 100 mg daily at home).  - Off Farxiga with AKI, may be able to restart.  - Continue vericiguat 2.5 mg daily, did not tolerate 5 mg daily.  - He did not want ICD.  - Not candidate for advanced therapies with renal dysfunction and patient's desire to avoid invasive procedures.   2. CAD: Cath in 8/22 with severe 3VD, occluded RCA with collaterals, 99% proximal-mid LAD stenosis, 80% ostial LCx, significant disease in OMs and diagonals.  With low EF and 3VD, I recommended CABG.  He saw Dr. Cyndia Bent, and is adamant that he does not want to have surgery.  Dr. Cyndia Bent wanted him to get an MRI for viability, but if he is not going to have CABG, would not get MRI.  I reviewed his cath films with Dr. Burt Knack for PCI options.  Could potentially intervene on LAD, but would essentially be a CTO procedure with increased risk to the patient (including worsening renal function).  Based on recent REVIVED-BCIS trial, I do not think that Mr. Oehlert would derive much benefit from PCI in this situation in the absence of significant angina (which he does not  have currently).  Recent admit 10/23 with NSTEMI. He did not want any invasive interventions. No chest pain, HS-TnI mildly elevated likely due to demand ischemia.  - Continue atorvastatin.  - He is not on ASA due to Eliquis use.  3. Atrial fibrillation: DCCV to NSR in 9/22. NSR today.  -  Continue amiodarone 100 mg daily.   - Continue Eliquis 5 mg bid.   - We have discussed AF ablation in the past, but he is not interested in invasive procedures.   4. 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  5. CKD: stage 3b.  Post-transplant.  Followed by nephrology, Dr. Joseph Berkshire.  Baseline seems to be in the 2-2.5 range. Creatinine mildly higher at 2.3 today with diuresis, still has some volume overload.  Follow closely.  - Continue immunosuppressives.  6. Chronic Anemia: Likely related to CKD. He is on Eliquis for AF. No bleeding issues. 7. Thrombocytopenia: Plts down to 60K, in past plts have fluctuated significantly when hospitalized.   8. COVID-19+: Found this admission.  Suspect main problem is CHF.  - Continue molnupiravir.  9.  GOC: He is DNR/DNI.  Has severe CAD, unrevascularized, as well as advanced CHF and CKD stage IIIb as well as pancytopenia. General failure to thrive.  Would consider hospice/comfort care, he is open to further discussions with palliative service.   Length of Stay: 2  Loralie Champagne, MD  01/03/2022, 8:23 AM  Advanced Heart Failure Team Pager [phone] (M-F; 7a - 5p)  Please contact [facility] for night-coverage after hours (5p -7a ) and weekends on [email or web address]

## 2022-01-03 NOTE — Plan of Care (Signed)
  Problem: Coping: Goal: Ability to adjust to condition or change in health will improve Outcome: Progressing   Problem: Fluid Volume: Goal: Ability to maintain a balanced intake and output will improve Outcome: Progressing   Problem: Metabolic: Goal: Ability to maintain appropriate glucose levels will improve Outcome: Progressing   Problem: Nutritional: Goal: Maintenance of adequate nutrition will improve Outcome: Progressing Goal: Progress toward achieving an optimal weight will improve Outcome: Progressing   Problem: Skin Integrity: Goal: Risk for impaired skin integrity will decrease Outcome: Progressing   Problem: Clinical Measurements: Goal: Will remain free from infection Outcome: Progressing

## 2022-01-03 NOTE — Progress Notes (Signed)
Daily Progress Note   Patient Name: Daniel Myers       Date: 01/03/2022 DOB: November 05, 1945  Age: 76 y.o. MRN#: [id number] Attending Physician: Domenic Polite, MD Primary Care Physician: Claretta Fraise, MD Admit Date: 01/01/2022  Reason for Consultation/Follow-up: Establishing goals of care  Subjective: Chart review performed including progress notes, labs, imaging.  Discussed with TOC, patient is no longer interested in discussing his options with a hospice liaison.  Patient then assessed at the bedside.  He is feeling good today, no symptoms at this time.  His daughter [name] is present visiting.  Explained to patient that I was following up on his discussion yesterday with my colleague Elmer Picker NP.  I shared that it was my understanding he was interested in hearing from a hospice liaison and that I requested social work to assist with this.  Patient quickly becomes frustrated, stating "this is the fifth time I have heard about hospice today, I do not want it and I never will."  I asked patient if he would like to discuss what has changed to make him feel this way.  He stated that his sister died yesterday while on hospice and that it is depressing to talk about.  He states "they just take your medicine away and make you die faster."  Attempted to provide clarification that this is not the case but patient has difficulty hearing this information, interjecting and stating "I do not care how you try to make it sound, they just kill you.  I know because that is what happened to my wife 15 years ago."    Provided reassurance that patient will be supported no matter what decision he makes, while also emphasizing the importance of understanding all of his options clearly.  He is frustrated with the  involvement of palliative care and does not want any further visits or goals of care discussions.  Informed patient that I would be respectful of this and also took the opportunity to ask his daughter if she had any further needs.  She denied any at this time.  Encourage patient and family to consider completion of advance directives during this admission, given his statements that he will be dying at home and not coming back to this hospital.  He is agreeable to getting this done, although remains hesitant and  suspicious of ulterior motives.  Reassurance was again provided.  All questions and concerns addressed. Encouraged to call with questions and/or concerns. PMT card previously provided.  Length of Stay: 2  Physical Exam Vitals and nursing note reviewed.  Constitutional:      General: He is not in acute distress. Cardiovascular:     Rate and Rhythm: Normal rate.  Pulmonary:     Effort: No respiratory distress.  Skin:    General: Skin is warm and dry.  Neurological:     Mental Status: He is alert and oriented to person, place, and time.  Psychiatric:        Attention and Perception: Attention normal.            Vital Signs: BP (!) 110/54 (BP Location: Right Arm)   Pulse 66   Temp 97.8 F (36.6 C) (Oral)   Resp 20   Ht '5\' 10"'$  (1.778 m)   Wt 89.4 kg   SpO2 97%   BMI 28.27 kg/m  SpO2: SpO2: 97 % O2 Device: O2 Device: Room Air O2 Flow Rate: O2 Flow Rate (L/min): 2 L/min       Palliative Assessment/Data: PPS 50-60%    Palliative Care Assessment & Plan   Patient Profile: 76 y.o. male  with past medical history of chronic combined CHF (declined AICD), CKD IV s/p renal transplant, HTN, HLD, CAD (declined CABG), stage 3b CKD, DM, and afib on Eliquis presented to the ED on 01/01/22 from home with complaints of chest pain and shortness of breath. Patient was admitted on 01/01/2022 with acute on chronic combined systolic and diastolic heart failure, ischemic cardiomyopathy,  possible NSTEMI, COVID+.   Of note, patient has been hospitalized 3 times in the last 6 months and is a 30 day readmission. Most recent hospitalization was 11/6-11/13/23 for treatment of heart failure.   Assessment: Principal Problem:   NSTEMI (non-ST elevated myocardial infarction) ([place]) Active Problems:   Essential hypertension   Insulin dependent type 2 diabetes mellitus ([place])   Renal transplant recipient   Hyperlipidemia   COVID-19 virus infection   CKD (chronic kidney disease) stage 4, GFR 15-29 ml/min (HCC)   Persistent atrial fibrillation (HCC)   Hyperkalemia   DNR (do not resuscitate)   Acute on chronic combined systolic (congestive) and diastolic (congestive) heart failure ([place])   Concern about end-of-life  Recommendations/Plan: Continue DNR Continue current care, no invasive procedures Patient is quite clear today that he is not interested in hospice and would not like any further visits from PMT Spiritual care consult for assistance with advanced directives, patient would like to name HCPOA prior to discharge PMT remains available as needed if patient is interested however will sign off at this time given his request  Prognosis:  < 6 months  Discharge Planning: To Be Determined  Care plan was discussed with patient, patient's daughter, TOC, Dr. Broadus John, Dr. Aundra Dubin   MDM: High   Dorthy Cooler, [facility] Palliative Medicine Team Team phone # [phone]  Thank you for allowing the Palliative Medicine Team to assist in the care of this patient. Please utilize secure chat with additional questions, if there is no response within 30 minutes please call the above phone number.  Palliative Medicine Team providers are available by phone from 7am to 7pm daily and can be reached through the team cell phone.  Should this patient require assistance outside of these hours, please call the patient's attending physician.

## 2022-01-04 DIAGNOSIS — I214 Non-ST elevation (NSTEMI) myocardial infarction: Secondary | ICD-10-CM | POA: Diagnosis not present

## 2022-01-04 LAB — BASIC METABOLIC PANEL
Anion gap: 12 (ref 5–15)
BUN: 71 mg/dL — ABNORMAL HIGH (ref 8–23)
CO2: 20 mmol/L — ABNORMAL LOW (ref 22–32)
Calcium: 7.7 mg/dL — ABNORMAL LOW (ref 8.9–10.3)
Chloride: 96 mmol/L — ABNORMAL LOW (ref 98–111)
Creatinine, Ser: 2.48 mg/dL — ABNORMAL HIGH (ref 0.61–1.24)
GFR, Estimated: 26 mL/min — ABNORMAL LOW (ref >60)
Glucose, Bld: 184 mg/dL — ABNORMAL HIGH (ref 70–99)
Potassium: 4.7 mmol/L (ref 3.5–5.1)
Sodium: 128 mmol/L — ABNORMAL LOW (ref 135–145)

## 2022-01-04 LAB — CBC WITH DIFFERENTIAL/PLATELET
Abs Immature Granulocytes: 0.05 10*3/uL (ref 0.00–0.07)
Basophils Absolute: 0 10*3/uL (ref 0.0–0.1)
Basophils Relative: 1 %
Eosinophils Absolute: 0.1 10*3/uL (ref 0.0–0.5)
Eosinophils Relative: 2 %
HCT: 29.9 % — ABNORMAL LOW (ref 39.0–52.0)
Hemoglobin: 9.3 g/dL — ABNORMAL LOW (ref 13.0–17.0)
Immature Granulocytes: 2 %
Lymphocytes Relative: 37 %
Lymphs Abs: 0.8 10*3/uL (ref 0.7–4.0)
MCH: 27.9 pg (ref 26.0–34.0)
MCHC: 31.1 g/dL (ref 30.0–36.0)
MCV: 89.8 fL (ref 80.0–100.0)
Monocytes Absolute: 0.2 10*3/uL (ref 0.1–1.0)
Monocytes Relative: 11 %
Neutro Abs: 1 10*3/uL — ABNORMAL LOW (ref 1.7–7.7)
Neutrophils Relative %: 47 %
Platelets: 62 10*3/uL — ABNORMAL LOW (ref 150–400)
RBC: 3.33 MIL/uL — ABNORMAL LOW (ref 4.22–5.81)
RDW: 16.1 % — ABNORMAL HIGH (ref 11.5–15.5)
WBC: 2.1 10*3/uL — ABNORMAL LOW (ref 4.0–10.5)
nRBC: 0 % (ref 0.0–0.2)

## 2022-01-04 LAB — GLUCOSE, CAPILLARY
Glucose-Capillary: 125 mg/dL — ABNORMAL HIGH (ref 70–99)
Glucose-Capillary: 163 mg/dL — ABNORMAL HIGH (ref 70–99)
Glucose-Capillary: 182 mg/dL — ABNORMAL HIGH (ref 70–99)
Glucose-Capillary: 177 mg/dL — ABNORMAL HIGH (ref 70–99)

## 2022-01-04 MED ORDER — GUAIFENESIN ER 600 MG PO TB12
600.0000 mg | ORAL_TABLET | Freq: Two times a day (BID) | ORAL | Status: DC
  Administered 2022-01-04 – 2022-01-05 (×3): 600 mg via ORAL
  Filled 2022-01-04 (×3): qty 1

## 2022-01-04 MED ORDER — GUAIFENESIN-DM 100-10 MG/5ML PO SYRP
5.0000 mL | ORAL_SOLUTION | ORAL | Status: DC | PRN
  Administered 2022-01-04 (×2): 5 mL via ORAL
  Filled 2022-01-04 (×2): qty 5

## 2022-01-04 MED ORDER — VERICIGUAT 2.5 MG PO TABS
2.5000 mg | ORAL_TABLET | Freq: Every day | ORAL | Status: DC
  Administered 2022-01-04 – 2022-01-05 (×2): 2.5 mg via ORAL
  Filled 2022-01-04 (×2): qty 1

## 2022-01-04 NOTE — Progress Notes (Addendum)
Patient ID: Daniel Myers, male   DOB: 11-03-1945, 76 y.o.   MRN: [id number]     Advanced Heart Failure Rounding Note  PCP-Cardiologist: Daniel Lesches, MD   Subjective:    I/Os mildly negative.  Creatinine 2.35 => 2.48.    Denies dyspnea, coughed a lot last night.     Objective:   Weight Range: 89.7 kg Body mass index is 28.37 kg/m.   Vital Signs:   Temp:  [97.8 F (36.6 C)] 97.8 F (36.6 C) (11/27 0800) Pulse Rate:  [64-72] 70 (11/27 0800) Resp:  [16-18] 16 (11/27 0800) BP: (118-128)/(62-66) 128/66 (11/27 0800) SpO2:  [94 %-97 %] 94 % (11/27 0800) Weight:  [89.7 kg] 89.7 kg (11/27 0148) Last BM Date : 01/03/22  Weight change: Filed Weights   01/02/22 0043 01/03/22 0140 01/04/22 0148  Weight: 91.2 kg 89.4 kg 89.7 kg    Intake/Output:   Intake/Output Summary (Last 24 hours) at 01/04/2022 1048 Last data filed at 01/04/2022 0924 Gross per 24 hour  Intake 820 ml  Output 1500 ml  Net -680 ml      Physical Exam    General: NAD Neck: JVP 8-9 cm, no thyromegaly or thyroid nodule.  Lungs: Clear to auscultation bilaterally with normal respiratory effort. CV: Nondisplaced PMI.  Heart regular S1/S2, no S3/S4, no murmur.  No peripheral edema.   Abdomen: Soft, nontender, no hepatosplenomegaly, no distention.  Skin: Intact without lesions or rashes.  Neurologic: Alert and oriented x 3.  Psych: Normal affect. Extremities: No clubbing or cyanosis.  HEENT: Normal.    Telemetry   NSR 60s (personally reviewed)  Labs    CBC Recent Labs    01/02/22 0057 01/03/22 0056  WBC 2.8* 2.4*  HGB 10.6* 9.7*  HCT 35.5* 30.1*  MCV 92.7 90.4  PLT 73* 60*   Basic Metabolic Panel Recent Labs    01/03/22 0056 01/04/22 0044  NA 130* 128*  K 4.8 4.7  CL 100 96*  CO2 22 20*  GLUCOSE 212* 184*  BUN 61* 71*  CREATININE 2.35* 2.48*  CALCIUM 7.8* 7.7*   Liver Function Tests No results for input(s): "AST", "ALT", "ALKPHOS", "BILITOT", "PROT", "ALBUMIN" in the last 72  hours.  No results for input(s): "LIPASE", "AMYLASE" in the last 72 hours. Cardiac Enzymes No results for input(s): "CKTOTAL", "CKMB", "CKMBINDEX", "TROPONINI" in the last 72 hours.  BNP: BNP (last 3 results) Recent Labs    12/14/21 1101 12/28/21 1615 01/01/22 0547  BNP >4,500.0* >5000.0* 3,800.9*    ProBNP (last 3 results) No results for input(s): "PROBNP" in the last 8760 hours.   D-Dimer No results for input(s): "DDIMER" in the last 72 hours. Hemoglobin A1C No results for input(s): "HGBA1C" in the last 72 hours. Fasting Lipid Panel No results for input(s): "CHOL", "HDL", "LDLCALC", "TRIG", "CHOLHDL", "LDLDIRECT" in the last 72 hours. Thyroid Function Tests No results for input(s): "TSH", "T4TOTAL", "T3FREE", "THYROIDAB" in the last 72 hours.  Invalid input(s): "FREET3"  Other results:   Imaging    No results found.   Medications:     Scheduled Medications:  amiodarone  100 mg Oral Daily   apixaban  5 mg Oral BID   atorvastatin  40 mg Oral Daily   ezetimibe  10 mg Oral Daily   insulin aspart  0-15 Units Subcutaneous TID WC   insulin NPH Human  10 Units Subcutaneous BID AC & HS   metoprolol succinate  25 mg Oral Daily   molnupiravir EUA  4 capsule Oral  BID   mycophenolate  500 mg Oral BID   predniSONE  5 mg Oral Q breakfast   sodium chloride flush  3 mL Intravenous Q12H   tacrolimus  1 mg Oral BID   Vericiguat  2.5 mg Oral Daily    Infusions:  sodium chloride      PRN Medications: sodium chloride, acetaminophen, albuterol, ALPRAZolam, guaiFENesin-dextromethorphan, nitroGLYCERIN, ondansetron (ZOFRAN) IV, sodium chloride flush   Assessment/Plan   1. Acute on chronic systolic CHF: Echo in 9/93 with EF 25-30%.   Patient had NSTEMI during 1/22 admission with COVID-19, not cathed with CKD.  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 RHC in 8/22 with preserved cardiac output and low filling pressures.  Echo 10/23 during admission for NSTEMI showed EF  worse 20-25%, RV mildly decreased. He was admitted for CHF earlier this month at [facility]. Back again with CHF and COVID-19.  Suspect mild residual volume overload but creatinine continues to trend up so will stop IV Lasix today.  - Reassess tomorrow, hopefully can restart po diuretics, will transition over to torsemide.   - Will leave off spironolactone with hyperkalemia this admission.  - Off ACEI/ARNI with hyperkalemia and renal dysfunction.  - Continue Toprol XL 25 mg daily (on 100 mg daily at home).  - Off Farxiga with AKI, would like to restart eventually.  - Continue vericiguat 2.5 mg daily, did not tolerate 5 mg daily.  - He did not want ICD.  - Not candidate for advanced therapies with renal dysfunction and patient's desire to avoid invasive procedures.   2. CAD: Cath in 8/22 with severe 3VD, occluded RCA with collaterals, 99% proximal-mid LAD stenosis, 80% ostial LCx, significant disease in OMs and diagonals.  With low EF and 3VD, I recommended CABG.  He saw Dr. Cyndia Myers, and is adamant that he does not want to have surgery.  Dr. Cyndia Myers wanted him to get an MRI for viability, but if he is not going to have CABG, would not get MRI.  I reviewed his cath films with Dr. Burt Myers for PCI options.  Could potentially intervene on LAD, but would essentially be a CTO procedure with increased risk to the patient (including worsening renal function).  Based on recent REVIVED-BCIS trial, I do not think that Mr. Daniel Myers would derive much benefit from PCI in this situation in the absence of significant angina (which he does not have currently).  Recent admit 10/23 with NSTEMI. He did not want any invasive interventions. No chest pain, HS-TnI mildly elevated likely due to demand ischemia.  - Continue atorvastatin.  - He is not on ASA due to Eliquis use.  3. Atrial fibrillation: DCCV to NSR in 9/22. NSR today.  - Continue amiodarone 100 mg daily.   - Continue Eliquis 5 mg bid.   - We have discussed AF ablation in  the past, but he is not interested in invasive procedures.   4. 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  5. CKD: stage 3b.  Post-transplant.  Followed by nephrology, Dr. Joseph Myers.  Baseline seems to be in the 2-2.5 range. Creatinine mildly higher at 2.48 today with rising BUN, still has some volume overload but would stop aggressive diuresis at this point.  - Continue immunosuppressives.  6. Chronic Anemia: Likely related to CKD. He is on Eliquis for AF. No bleeding issues. 7. Thrombocytopenia: Plts down to 60K yesterday, in past plts have fluctuated significantly when hospitalized.   - ?related to one of his immunosuppressive meds => possibly mycophenolate.  -  Repeat CBC today.  8. Leukopenia: Noted on CBC yesterday.  - ?related to one of his immunosuppressive meds => possibly mycophenolate.  - Repeat CBC today.  9. COVID-19+: Found this admission.  Suspect main problem is CHF.  - Continue molnupiravir.  10. Hyponatremia: Na 128, suspect hypervolemic hyponatremia.  - Fluid restrict.  11.  GOC: He is DNR/DNI.  Has severe CAD, unrevascularized, as well as advanced CHF and CKD stage IIIb as well as pancytopenia. General failure to thrive.  Not interested in hospice.   Length of Stay: 3  Loralie Champagne, MD  01/04/2022, 10:48 AM  Advanced Heart Failure Team Pager [phone] (M-F; 7a - 5p)  Please contact [facility] for night-coverage after hours (5p -7a ) and weekends on [email or web address]

## 2022-01-04 NOTE — Progress Notes (Signed)
This chaplain responded to PMT PA-Josseline's consult for creating/updating the Pt. Advance Directive: HCPOA with a phone call to the Pt. room. The chaplain is appreciative of RN-Mary's assistance.  The call was not answered by the Pt.,therefore education was not provided. Spiritual Care will not be able to facilitate the notarization of the Pt. HCPOA in the hospital without Pt. education and lifting of the Pt. COVID contact precautions.  This chaplain is available for F/U spiritual care as needed.  Chaplain Sallyanne Kuster [phone]

## 2022-01-04 NOTE — Plan of Care (Signed)
Pt had BM x1. Pt walked with assistance around room. Pt complaining of nausea this afternoon; PRN zofran given; pt verbalized not getting much relief from zofran and did not feel like eating dinner.  Problem: Education: Goal: Ability to describe self-care measures that may prevent or decrease complications (Diabetes Survival Skills Education) will improve Outcome: Progressing Goal: Individualized Educational Video(s) Outcome: Progressing   Problem: Coping: Goal: Ability to adjust to condition or change in health will improve Outcome: Progressing   Problem: Fluid Volume: Goal: Ability to maintain a balanced intake and output will improve Outcome: Progressing   Problem: Health Behavior/Discharge Planning: Goal: Ability to identify and utilize available resources and services will improve Outcome: Progressing Goal: Ability to manage health-related needs will improve Outcome: Progressing   Problem: Metabolic: Goal: Ability to maintain appropriate glucose levels will improve Outcome: Progressing   Problem: Nutritional: Goal: Maintenance of adequate nutrition will improve Outcome: Progressing Goal: Progress toward achieving an optimal weight will improve Outcome: Progressing   Problem: Skin Integrity: Goal: Risk for impaired skin integrity will decrease Outcome: Progressing   Problem: Tissue Perfusion: Goal: Adequacy of tissue perfusion will improve Outcome: Progressing   Problem: Education: Goal: Understanding of cardiac disease, CV risk reduction, and recovery process will improve Outcome: Progressing Goal: Individualized Educational Video(s) Outcome: Progressing   Problem: Activity: Goal: Ability to tolerate increased activity will improve Outcome: Progressing   Problem: Cardiac: Goal: Ability to achieve and maintain adequate cardiovascular perfusion will improve Outcome: Progressing   Problem: Health Behavior/Discharge Planning: Goal: Ability to safely manage  health-related needs after discharge will improve Outcome: Progressing   Problem: Education: Goal: Knowledge of General Education information will improve Description: Including pain rating scale, medication(s)/side effects and non-pharmacologic comfort measures Outcome: Progressing   Problem: Health Behavior/Discharge Planning: Goal: Ability to manage health-related needs will improve Outcome: Progressing   Problem: Clinical Measurements: Goal: Ability to maintain clinical measurements within normal limits will improve Outcome: Progressing Goal: Will remain free from infection Outcome: Progressing Goal: Diagnostic test results will improve Outcome: Progressing Goal: Respiratory complications will improve Outcome: Progressing Goal: Cardiovascular complication will be avoided Outcome: Progressing   Problem: Activity: Goal: Risk for activity intolerance will decrease Outcome: Progressing   Problem: Nutrition: Goal: Adequate nutrition will be maintained Outcome: Progressing   Problem: Coping: Goal: Level of anxiety will decrease Outcome: Progressing   Problem: Elimination: Goal: Will not experience complications related to bowel motility Outcome: Progressing Goal: Will not experience complications related to urinary retention Outcome: Progressing   Problem: Pain Managment: Goal: General experience of comfort will improve Outcome: Progressing   Problem: Safety: Goal: Ability to remain free from injury will improve Outcome: Progressing   Problem: Skin Integrity: Goal: Risk for impaired skin integrity will decrease Outcome: Progressing

## 2022-01-04 NOTE — Inpatient Diabetes Management (Signed)
Inpatient Diabetes Program Recommendations  AACE/ADA: New Consensus Statement on Inpatient Glycemic Control (2015)  Target Ranges:  Prepandial:   less than 140 mg/dL      Peak postprandial:   less than 180 mg/dL (1-2 hours)      Critically ill patients:  140 - 180 mg/dL   Lab Results  Component Value Date   GLUCAP 125 (H) 01/04/2022   HGBA1C 5.8 (H) 12/14/2021    Review of Glycemic Control  Latest Reference Range & Units 01/03/22 06:30 01/03/22 11:55 01/03/22 17:08 01/03/22 21:12 01/04/22 06:12  Glucose-Capillary 70 - 99 mg/dL 130 (H) 156 (H) 191 (H) 234 (H) 125 (H)  (H): Data is abnormally high Diabetes history: Type 2 DM Outpatient Diabetes medications: NPH 10 units BID, Relion R 16 units B/ 10 units D Current orders for Inpatient glycemic control: NPH 10 units BID, Novolog 0-15 units TID Prednisone 5 mg QD  Inpatient Diabetes Program Recommendations:    Could consider adding Novolog 0-5 units QHS.   Thanks, Bronson Curb, MSN, RNC-OB Diabetes Coordinator [phone] (8a-5p)

## 2022-01-04 NOTE — Progress Notes (Signed)
PROGRESS NOTE    Daniel Myers  XTG:[id number] DOB: [birth year] DOA: 01/01/2022 PCP: Claretta Fraise, MD  76/M w/ chronic combined CHF; renal transplant; HTN; HLD; severe multivessel CAD (declined CABG); stage 3b CKD; DM; and afib on Eliquis presenting with CP/SOB.  He was last admitted from 11/6-13 with acute combined CHF and afib.  Presented to the ED with acute dyspnea X 1 day, orthopnea, mild cough  ER Course:  h/o CHF, ECHO 20-25%, chest x-ray w/ CHF.  -Admitted, started on diuretics and molnupiravir, seen by cardiology, palliative consult recommended  Subjective: -Breathing better overall, mild dry cough  Assessment and Plan:  Acute on chronic combined CHF -Known cardiomyopathy, echo 10/23 with EF 20-25% with grade 3 diastolic dysfunction -Continue Lasix and Aldactone, cardiology following -Only 1.6 L negative, questionable accuracy, weight largely unchanged -Overall prognosis is poor, seen by palliative care, patient adamantly declines hospice, he is at high risk of quick rehospitalization  Elevated troponin CAD-severe, multivessel -Cath 8/22 with severe multivessel CAD, patient declined CABG, PCI felt to be of limited benefit -Likely demand ischemia at this time, no plans for further workup, appreciate cardiology input -Continue aspirin, metoprolol, statin  COVID infection -Mild, no x-ray changes, continue molnupiravir day 4/5  CKD 4 Renal transplant -Renal function relatively stable, baseline creatinine around 2-2.2 -continue immunosuppressants- prednisone, tacrolimus, mycophenolate and Bactrim for prophylaxis  Paroxysmal A-fib -Rate controlled, continue Toprol, amiodarone, Eliquis  Type 2 diabetes mellitus -Continue NPH, last A1c was 6.2, sensitive sliding scale  Hyperkalemia -Given Lokelma 11/25, improved  DVT prophylaxis: Eliquis Code Status: DNR Family Communication: None present, discussed patient detail Disposition Plan: To be determined  Consultants:  Cards, palliative care   Procedures:   Antimicrobials:    Objective: Vitals:   01/03/22 1955 01/04/22 0148 01/04/22 0248 01/04/22 0800  BP: 126/62  118/64 128/66  Pulse: 72  64 70  Resp: '18  18 16  '$ Temp: 97.8 F (36.6 C)  97.8 F (36.6 C) 97.8 F (36.6 C)  TempSrc: Oral  Oral Oral  SpO2: 97%  94% 94%  Weight:  89.7 kg    Height:        Intake/Output Summary (Last 24 hours) at 01/04/2022 0945 Last data filed at 01/04/2022 2703 Gross per 24 hour  Intake 820 ml  Output 1500 ml  Net -680 ml   Filed Weights   01/02/22 0043 01/03/22 0140 01/04/22 0148  Weight: 91.2 kg 89.4 kg 89.7 kg    Examination:  General exam: Elderly chronically ill male laying in bed, AAOx3, no distress HEENT: Positive JVD CVS: S1-S2, regular rhythm Lungs: Poor air movement bilaterally, decreased at the bases Abdomen: Soft, nontender, bowel sounds present Extremities: Trace edema  Skin: No rashes Psychiatry:  Mood & affect appropriate.   Data Reviewed:   CBC: Recent Labs  Lab 12/28/21 1615 01/01/22 0547 01/01/22 0555 01/02/22 0057 01/03/22 0056  WBC 3.4 4.9  --  2.8* 2.4*  NEUTROABS 2.9 4.3  --   --   --   HGB 11.1* 12.0* 13.6 10.6* 9.7*  HCT 36.3* 40.5 40.0 35.5* 30.1*  MCV 91 93.5  --  92.7 90.4  PLT 152 112*  --  73* 60*   Basic Metabolic Panel: Recent Labs  Lab 12/28/21 1615 01/01/22 0547 01/01/22 0555 01/02/22 0057 01/03/22 0056 01/04/22 0044  NA 138 137 134* 133* 130* 128*  K 4.9 5.2* 5.1 5.5* 4.8 4.7  CL 104 100 103 102 100 96*  CO2 20 22  --  23 22  20*  GLUCOSE 184* 216* 217* 193* 212* 184*  BUN 43* 48* 44* 53* 61* 71*  CREATININE 1.98* 2.11* 2.20* 2.14* 2.35* 2.48*  CALCIUM 8.0* 8.5*  --  8.3* 7.8* 7.7*  MG  --  2.1  --   --   --   --    GFR: Estimated Creatinine Clearance: 29 mL/min (A) (by C-G formula based on SCr of 2.48 mg/dL (H)). Liver Function Tests: Recent Labs  Lab 12/28/21 1615 01/01/22 0547  AST 17 23  ALT 10 15  ALKPHOS 82 70  BILITOT  0.8 1.3*  PROT 5.3* 6.0*  ALBUMIN 3.6* 3.5   No results for input(s): "LIPASE", "AMYLASE" in the last 168 hours. No results for input(s): "AMMONIA" in the last 168 hours. Coagulation Profile: Recent Labs  Lab 01/01/22 0547  INR 1.5*   Cardiac Enzymes: No results for input(s): "CKTOTAL", "CKMB", "CKMBINDEX", "TROPONINI" in the last 168 hours. BNP (last 3 results) No results for input(s): "PROBNP" in the last 8760 hours. HbA1C: No results for input(s): "HGBA1C" in the last 72 hours. CBG: Recent Labs  Lab 01/03/22 0630 01/03/22 1155 01/03/22 1708 01/03/22 2112 01/04/22 0612  GLUCAP 130* 156* 191* 234* 125*   Lipid Profile: No results for input(s): "CHOL", "HDL", "LDLCALC", "TRIG", "CHOLHDL", "LDLDIRECT" in the last 72 hours. Thyroid Function Tests: No results for input(s): "TSH", "T4TOTAL", "FREET4", "T3FREE", "THYROIDAB" in the last 72 hours. Anemia Panel: No results for input(s): "VITAMINB12", "FOLATE", "FERRITIN", "TIBC", "IRON", "RETICCTPCT" in the last 72 hours. Urine analysis:    Component Value Date/Time   COLORURINE YELLOW 12/18/2021 0748   APPEARANCEUR CLOUDY (A) 12/18/2021 0748   APPEARANCEUR Clear 04/17/2020 1629   LABSPEC 1.009 12/18/2021 0748   PHURINE 7.0 12/18/2021 0748   GLUCOSEU NEGATIVE 12/18/2021 0748   HGBUR MODERATE (A) 12/18/2021 0748   BILIRUBINUR NEGATIVE 12/18/2021 0748   BILIRUBINUR Negative 04/17/2020 [place] 12/18/2021 0748   PROTEINUR 100 (A) 12/18/2021 0748   NITRITE NEGATIVE 12/18/2021 0748   LEUKOCYTESUR LARGE (A) 12/18/2021 0748   Sepsis Labs: '@LABRCNTIP'$ (procalcitonin:4,lacticidven:4)  ) Recent Results (from the past 240 hour(s))  Resp Panel by RT-PCR (Flu A&B, Covid) Anterior Nasal Swab     Status: Abnormal   Collection Time: 01/01/22  8:01 AM   Specimen: Anterior Nasal Swab  Result Value Ref Range Status   SARS Coronavirus 2 by RT PCR POSITIVE (A) NEGATIVE Final    Comment: (NOTE) SARS-CoV-2 target nucleic  acids are DETECTED.  The SARS-CoV-2 RNA is generally detectable in upper respiratory specimens during the acute phase of infection. Positive results are indicative of the presence of the identified virus, but do not rule out bacterial infection or co-infection with other pathogens not detected by the test. Clinical correlation with patient history and other diagnostic information is necessary to determine patient infection status. The expected result is Negative.  Fact Sheet for Patients: EntrepreneurPulse.com.au  Fact Sheet for Healthcare Providers: IncredibleEmployment.be  This test is not yet approved or cleared by the [place] FDA and  has been authorized for detection and/or diagnosis of SARS-CoV-2 by FDA under an Emergency Use Authorization (EUA).  This EUA will remain in effect (meaning this test can be used) for the duration of  the COVID-19 declaration under Section 564(b)(1) of the A ct, 21 U.S.C. section 360bbb-3(b)(1), unless the authorization is terminated or revoked sooner.     Influenza A by PCR NEGATIVE NEGATIVE Final   Influenza B by PCR NEGATIVE NEGATIVE Final    Comment: (  NOTE) The Xpert Xpress SARS-CoV-2/FLU/RSV plus assay is intended as an aid in the diagnosis of influenza from Nasopharyngeal swab specimens and should not be used as a sole basis for treatment. Nasal washings and aspirates are unacceptable for Xpert Xpress SARS-CoV-2/FLU/RSV testing.  Fact Sheet for Patients: EntrepreneurPulse.com.au  Fact Sheet for Healthcare Providers: IncredibleEmployment.be  This test is not yet approved or cleared by the [place] FDA and has been authorized for detection and/or diagnosis of SARS-CoV-2 by FDA under an Emergency Use Authorization (EUA). This EUA will remain in effect (meaning this test can be used) for the duration of the COVID-19 declaration under Section 564(b)(1) of  the Act, 21 U.S.C. section 360bbb-3(b)(1), unless the authorization is terminated or revoked.  Performed at [facility], [place] [address] [place], [place] [address]      Radiology Studies: No results found.   Scheduled Meds:  amiodarone  100 mg Oral Daily   apixaban  5 mg Oral BID   atorvastatin  40 mg Oral Daily   ezetimibe  10 mg Oral Daily   furosemide  80 mg Intravenous BID   insulin aspart  0-15 Units Subcutaneous TID WC   insulin NPH Human  10 Units Subcutaneous BID AC & HS   metoprolol succinate  25 mg Oral Daily   molnupiravir EUA  4 capsule Oral BID   mycophenolate  500 mg Oral BID   predniSONE  5 mg Oral Q breakfast   sodium chloride flush  3 mL Intravenous Q12H   tacrolimus  1 mg Oral BID   Vericiguat  2.5 mg Oral Daily   Continuous Infusions:  sodium chloride       LOS: 3 days    Time spent: 27mn    PDomenic Polite MD [facility]   01/04/2022, 9:45 AM

## 2022-01-04 NOTE — Care Management Important Message (Signed)
Important Message  Patient Details  Name: BREION NOVACEK MRN: [id number] Date of Birth: 12/23/1945   Medicare Important Message Given:  Yes     Shelda Altes 01/04/2022, 9:47 AM

## 2022-01-04 NOTE — Consult Note (Signed)
   [facility] Inpatient Consult   01/04/2022  Daniel Myers 08/27/1945 [id number]  [facility] [ACO] Patient: Medicare ACO REACH  Primary Care Provider:  Claretta Fraise, MD with [facility] is listed to provide the Transition of Care [TOC] post hospital follow up  Patient is currently active with [facility] for chronic disease management services.  Patient has been engaged by a [facility].  Our community based plan of care has focused on disease management and community resource support.   *Patient is currently on airborne precautions labs showing COVID-19 positive. Patient's electronic medical record for MD progress notes, Palliative/Hospice consults was reviewed and shows patient does not want this discussed during hospitalization.  Notes his sister recently passed away in hospice care per progress notes.   Plan: Following for disposition needs  Will follow up with unit progression and  Inpatient Transition Of Care [TOC] team member to make aware that [facility] following.   Of note, [facility] Care Management services does not replace or interfere with any services that are needed or arranged by inpatient [facility] care management team.   For additional questions or referrals please contact:  Natividad Brood, RN BSN [place]  [phone] business mobile phone Toll free office [phone]  *[place]  [phone] Fax number: [phone] [email or web address]

## 2022-01-05 ENCOUNTER — Other Ambulatory Visit (HOSPITAL_COMMUNITY): Payer: Self-pay

## 2022-01-05 DIAGNOSIS — I214 Non-ST elevation (NSTEMI) myocardial infarction: Secondary | ICD-10-CM | POA: Diagnosis not present

## 2022-01-05 LAB — CBC
HCT: 32.5 % — ABNORMAL LOW (ref 39.0–52.0)
Hemoglobin: 9.9 g/dL — ABNORMAL LOW (ref 13.0–17.0)
MCH: 27.3 pg (ref 26.0–34.0)
MCHC: 30.5 g/dL (ref 30.0–36.0)
MCV: 89.5 fL (ref 80.0–100.0)
Platelets: 74 10*3/uL — ABNORMAL LOW (ref 150–400)
RBC: 3.63 MIL/uL — ABNORMAL LOW (ref 4.22–5.81)
RDW: 15.9 % — ABNORMAL HIGH (ref 11.5–15.5)
WBC: 2.2 10*3/uL — ABNORMAL LOW (ref 4.0–10.5)
nRBC: 0 % (ref 0.0–0.2)

## 2022-01-05 LAB — BASIC METABOLIC PANEL
Anion gap: 9 (ref 5–15)
BUN: 69 mg/dL — ABNORMAL HIGH (ref 8–23)
CO2: 21 mmol/L — ABNORMAL LOW (ref 22–32)
Calcium: 7.7 mg/dL — ABNORMAL LOW (ref 8.9–10.3)
Chloride: 96 mmol/L — ABNORMAL LOW (ref 98–111)
Creatinine, Ser: 2.34 mg/dL — ABNORMAL HIGH (ref 0.61–1.24)
GFR, Estimated: 28 mL/min — ABNORMAL LOW (ref >60)
Glucose, Bld: 198 mg/dL — ABNORMAL HIGH (ref 70–99)
Potassium: 4.4 mmol/L (ref 3.5–5.1)
Sodium: 126 mmol/L — ABNORMAL LOW (ref 135–145)

## 2022-01-05 LAB — GLUCOSE, CAPILLARY
Glucose-Capillary: 172 mg/dL — ABNORMAL HIGH (ref 70–99)
Glucose-Capillary: 177 mg/dL — ABNORMAL HIGH (ref 70–99)

## 2022-01-05 MED ORDER — TORSEMIDE 20 MG PO TABS
40.0000 mg | ORAL_TABLET | Freq: Every day | ORAL | 0 refills | Status: DC
  Filled 2022-01-05: qty 60, 30d supply, fill #0

## 2022-01-05 MED ORDER — TORSEMIDE 20 MG PO TABS
40.0000 mg | ORAL_TABLET | Freq: Every day | ORAL | Status: DC
  Administered 2022-01-05: 40 mg via ORAL
  Filled 2022-01-05: qty 2

## 2022-01-05 MED ORDER — INSULIN REGULAR HUMAN 100 UNIT/ML IJ SOLN: INTRAMUSCULAR | 5 refills | Status: AC

## 2022-01-05 NOTE — Progress Notes (Signed)
Telemetry called to notify nursethat patient was off telemetry. Nurse entered room to place patient back on and patient refused. Patient states he is going to be discharged and is not putting it back on. Telemetry notified.

## 2022-01-05 NOTE — Progress Notes (Addendum)
Patient ID: Daniel Myers, male   DOB: 14-Oct-1945, 76 y.o.   MRN: [id number]     Advanced Heart Failure Rounding Note  PCP-Cardiologist: Rozann Lesches, MD   Subjective:    I/Os mildly negative. Creatinine 2.35 => 2.48 => 2.34.    Feels good this morning, visitor at bedside. Ambulating in room with no complaints.    Objective:   Weight Range: 89.4 kg Body mass index is 28.3 kg/m.   Vital Signs:   Temp:  [98 F (36.7 C)-98.4 F (36.9 C)] 98.4 F (36.9 C) (11/28 0320) Pulse Rate:  [62-70] 70 (11/28 0320) Resp:  [16-18] 18 (11/28 0320) BP: (118-131)/(57-58) 131/57 (11/28 0320) SpO2:  [92 %-96 %] 92 % (11/28 0320) Weight:  [89.4 kg] 89.4 kg (11/28 0320) Last BM Date : 01/04/22  Weight change: Filed Weights   01/03/22 0140 01/04/22 0148 01/05/22 0320  Weight: 89.4 kg 89.7 kg 89.4 kg    Intake/Output:   Intake/Output Summary (Last 24 hours) at 01/05/2022 1057 Last data filed at 01/05/2022 0840 Gross per 24 hour  Intake 360 ml  Output 700 ml  Net -340 ml      Physical Exam    General:  well appearing.  No respiratory difficulty HEENT: normal Neck: supple. JVD ~9 cm. Carotids 2+ bilat; no bruits. No lymphadenopathy or thyromegaly appreciated. Cor: PMI nondisplaced. Regular rate & rhythm. No rubs, gallops or murmurs. Lungs: clear Abdomen: soft, nontender, nondistended. No hepatosplenomegaly. No bruits or masses. Good bowel sounds. Extremities: no cyanosis, clubbing, rash, edema  Neuro: alert & oriented x 3, cranial nerves grossly intact. moves all 4 extremities w/o difficulty. Affect pleasant.   Telemetry   NSR 70s (personally reviewed)  Labs    CBC Recent Labs    01/04/22 0044 01/05/22 0050  WBC 2.1* 2.2*  NEUTROABS 1.0*  --   HGB 9.3* 9.9*  HCT 29.9* 32.5*  MCV 89.8 89.5  PLT 62* 74*   Basic Metabolic Panel Recent Labs    01/04/22 0044 01/05/22 0050  NA 128* 126*  K 4.7 4.4  CL 96* 96*  CO2 20* 21*  GLUCOSE 184* 198*  BUN 71* 69*   CREATININE 2.48* 2.34*  CALCIUM 7.7* 7.7*   Liver Function Tests No results for input(s): "AST", "ALT", "ALKPHOS", "BILITOT", "PROT", "ALBUMIN" in the last 72 hours.  No results for input(s): "LIPASE", "AMYLASE" in the last 72 hours. Cardiac Enzymes No results for input(s): "CKTOTAL", "CKMB", "CKMBINDEX", "TROPONINI" in the last 72 hours.  BNP: BNP (last 3 results) Recent Labs    12/14/21 1101 12/28/21 1615 01/01/22 0547  BNP >4,500.0* >5000.0* 3,800.9*    ProBNP (last 3 results) No results for input(s): "PROBNP" in the last 8760 hours.   D-Dimer No results for input(s): "DDIMER" in the last 72 hours. Hemoglobin A1C No results for input(s): "HGBA1C" in the last 72 hours. Fasting Lipid Panel No results for input(s): "CHOL", "HDL", "LDLCALC", "TRIG", "CHOLHDL", "LDLDIRECT" in the last 72 hours. Thyroid Function Tests No results for input(s): "TSH", "T4TOTAL", "T3FREE", "THYROIDAB" in the last 72 hours.  Invalid input(s): "FREET3"  Other results:   Imaging    No results found.   Medications:     Scheduled Medications:  amiodarone  100 mg Oral Daily   apixaban  5 mg Oral BID   atorvastatin  40 mg Oral Daily   ezetimibe  10 mg Oral Daily   guaiFENesin  600 mg Oral BID   insulin aspart  0-15 Units Subcutaneous TID WC  insulin NPH Human  10 Units Subcutaneous BID AC & HS   metoprolol succinate  25 mg Oral Daily   molnupiravir EUA  4 capsule Oral BID   mycophenolate  500 mg Oral BID   predniSONE  5 mg Oral Q breakfast   sodium chloride flush  3 mL Intravenous Q12H   tacrolimus  1 mg Oral BID   torsemide  40 mg Oral Daily   Vericiguat  2.5 mg Oral Daily    Infusions:  sodium chloride      PRN Medications: sodium chloride, acetaminophen, albuterol, ALPRAZolam, guaiFENesin-dextromethorphan, nitroGLYCERIN, ondansetron (ZOFRAN) IV, sodium chloride flush   Assessment/Plan   1. Acute on chronic systolic CHF: Echo in 4/33 with EF 25-30%.   Patient had  NSTEMI during 1/22 admission with COVID-19, not cathed with CKD.  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 RHC in 8/22 with preserved cardiac output and low filling pressures.  Echo 10/23 during admission for NSTEMI showed EF worse 20-25%, RV mildly decreased. He was admitted for CHF earlier this month at [facility]. Back again with CHF and COVID-19.  Suspect mild residual volume overload but creatinine continues to trend up so will stop IV Lasix today.  - Start '40mg'$  PO Torsemide daily - Will leave off spironolactone with hyperkalemia this admission.  - Off ACEI/ARNI with hyperkalemia and renal dysfunction.  - Continue Toprol XL 25 mg daily (on 100 mg daily at home).  - Off Farxiga with AKI, would like to restart eventually.  - Continue vericiguat 2.5 mg daily, did not tolerate 5 mg daily.  - He did not want ICD.  - Not candidate for advanced therapies with renal dysfunction and patient's desire to avoid invasive procedures.   2. CAD: Cath in 8/22 with severe 3VD, occluded RCA with collaterals, 99% proximal-mid LAD stenosis, 80% ostial LCx, significant disease in OMs and diagonals.  With low EF and 3VD, I recommended CABG.  He saw Dr. Cyndia Bent, and is adamant that he does not want to have surgery.  Dr. Cyndia Bent wanted him to get an MRI for viability, but if he is not going to have CABG, would not get MRI.  I reviewed his cath films with Dr. Burt Knack for PCI options.  Could potentially intervene on LAD, but would essentially be a CTO procedure with increased risk to the patient (including worsening renal function).  Based on recent REVIVED-BCIS trial, I do not think that Mr. Nyce would derive much benefit from PCI in this situation in the absence of significant angina (which he does not have currently).  Recent admit 10/23 with NSTEMI. He did not want any invasive interventions. No chest pain, HS-TnI mildly elevated likely due to demand ischemia.  - Continue atorvastatin.  - He is not on ASA due to Eliquis  use.  3. Atrial fibrillation: DCCV to NSR in 9/22. NSR today.  - Continue amiodarone 100 mg daily.   - Continue Eliquis 5 mg bid.   - We have discussed AF ablation in the past, but he is not interested in invasive procedures.   4. 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  5. CKD: stage 3b.  Post-transplant.  Followed by nephrology, Dr. Joseph Berkshire.  Baseline seems to be in the 2-2.5 range. Creatinine mildly higher at 2.48 today with rising BUN, still has some volume overload but would stop aggressive diuresis at this point.  - Continue immunosuppressives.  6. Chronic Anemia: Likely related to CKD. He is on Eliquis for AF. No bleeding issues. 7. Thrombocytopenia: Plts  down to 60K yesterday, in past plts have fluctuated significantly when hospitalized.   - ?related to one of his immunosuppressive meds => possibly mycophenolate.  8. Leukopenia: Noted on CBC yesterday.  - ?related to one of his immunosuppressive meds => possibly mycophenolate.  - Repeat CBC today.  9. COVID-19+: Found this admission.  Suspect main problem is CHF.  - Continue molnupiravir.  10. Hyponatremia: Na 128, suspect hypervolemic hyponatremia.  - Fluid restrict.  11.  GOC: He is DNR/DNI.  Has severe CAD, unrevascularized, as well as advanced CHF and CKD stage IIIb as well as pancytopenia. General failure to thrive.  Not interested in hospice.   Stable to d/c home from AHF team, F/u scheduled.   AHF meds at d/c: Amiodarone 100 mg daily Eliquis '5mg'$  BID Atorvastatin '40mg'$  Daily Zetia 10 mg daily Metoprolol succinate '25mg'$  daily Torsemide '40mg'$  daily Vericiguat 2.'5mg'$  daily  Length of Stay: [place] AGACNP-BC  01/05/2022, 10:57 AM  Advanced Heart Failure Team Pager [phone] (M-F; 7a - 5p)  Please contact [facility] for night-coverage after hours (5p -7a ) and weekends on [email or web address]   Patient seen with NP, agree with the above note .  No complaints today.  Breathing back to normal.  Creatinine lower at 2.34.  Na low at  126.   General: NAD Neck: No JVD, no thyromegaly or thyroid nodule.  Lungs: Clear to auscultation bilaterally with normal respiratory effort. CV: Nondisplaced PMI.  Heart regular S1/S2, no S3/S4, no murmur.  No peripheral edema.   Abdomen: Soft, nontender, no hepatosplenomegaly, no distention.  Skin: Intact without lesions or rashes.  Neurologic: Alert and oriented x 3.  Psych: Normal affect. Extremities: No clubbing or cyanosis.  HEENT: Normal.   See above, he has advanced HF and CAD but minimal options beyond current medical therapy.    Volume status looks ok, will start him on torsemide 40 mg daily for home.  Continue Toprol XL 25 mg daily and vericiguat 2.5 mg daily.  Elevated creatinine and low BP will preclude other GDMT.   Na low but not markedly so.  Maintain fluid restriction.   Will need close followup in HF clinic.   Plan noted for home today.   Loralie Champagne 01/05/2022 11:10 AM

## 2022-01-05 NOTE — Discharge Summary (Addendum)
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   Insulin dependent type 2 diabetes mellitus ([place])   Renal transplant recipient   CKD (chronic kidney disease) stage 4, GFR 15-29 ml/min (HCC)   Persistent atrial fibrillation (HCC)   Essential hypertension   Hyperlipidemia   COVID-19 virus infection   Hyperkalemia   DNR (do not resuscitate)   Acute on chronic systolic CHF (congestive heart failure) ([place])   Discharge Condition: Stable  Diet recommendation: Low-sodium, heart healthy  Filed Weights   01/03/22 0140 01/04/22 0148 01/05/22 0320  Weight: 89.4 kg 89.7 kg 89.4 kg    History of present illness:  76/M w/ chronic combined CHF, EF 20%; renal transplant; HTN; HLD; severe multivessel CAD (declined CABG); stage 3b CKD; DM; and afib on Eliquis presenting with CP/SOB.  He was last admitted from 11/6-13 with acute combined CHF and afib.  Presented to the ED with acute dyspnea X 1 day, orthopnea, mild cough    Hospital Course:   Acute on chronic combined CHF -Known cardiomyopathy, echo 10/23 with EF 20-25% with grade 3 diastolic dysfunction -Diuresed with IV Lasix, volume status has improved, followed by advanced heart failure team this admission  -Now transition to torsemide -Overall prognosis is poor, seen by palliative care, patient adamantly declines hospice, he is at high risk of quick rehospitalization, needs ongoing goals of care discussions, CHF team follow-up arranged   Elevated troponin CAD-severe, multivessel -Cath 8/22 with severe multivessel CAD, patient declined CABG,  PCI felt to be of limited benefit -Likely demand ischemia at this time, no plans for further workup, appreciate cardiology input -Continue metoprolol, statin   COVID infection -Mild, no x-ray changes, completed 5 days of molnupiravir today  CKD 4 Renal transplant -Renal function relatively stable, baseline creatinine around 2-2.2 -continue immunosuppressants- prednisone, tacrolimus, mycophenolate and Bactrim for prophylaxis   Paroxysmal A-fib -Rate controlled, continue Toprol, amiodarone, Eliquis   Type 2 diabetes mellitus -Continue NPH, last A1c was 6.2, sensitive sliding scale -Insulin dose decreased at discharge   Hyperkalemia -Given Lokelma 11/25, improved   Code Status: DNR  Discharge Exam: Vitals:   01/04/22 1742 01/05/22 0320  BP: (!) 123/58 (!) 131/57  Pulse: 62 70  Resp: 16 18  Temp: 98 F (36.7 C) 98.4 F (36.9 C)  SpO2: 96% 92%  General exam: Elderly chronically ill male laying in bed, AAOx3, no distress HEENT: Positive JVD CVS: S1-S2, regular rhythm Lungs: Poor air movement bilaterally, decreased at the bases Abdomen: Soft, nontender, bowel sounds present Extremities: Trace edema  Skin: No rashes Psychiatry:  Mood & affect appropriate.      Discharge Instructions   Discharge Instructions     Diet - low sodium heart healthy   Complete by: As directed    Increase activity slowly   Complete by: As directed       Allergies as of 01/05/2022       Reactions   Tape Rash   Use paper tape.    Trazodone And Nefazodone Palpitations   tachycardia   Mirtazapine    imbalance   Elemental Sulfur Rash   Sulfa Antibiotics Rash  Medication List     STOP taking these medications    furosemide 40 MG tablet Commonly known as: LASIX   spironolactone 25 MG tablet Commonly known as: ALDACTONE       TAKE these medications    acetaminophen 500 MG tablet Commonly known as: TYLENOL Take 500 mg by mouth every 8 (eight) hours as needed for  moderate pain.   albuterol 108 (90 Base) MCG/ACT inhaler Commonly known as: VENTOLIN HFA Inhale 2 puffs into the lungs every 6 (six) hours as needed for wheezing or shortness of breath.   ALPRAZolam 0.25 MG tablet Commonly known as: XANAX Take 1 tablet (0.25 mg total) by mouth at bedtime as needed for anxiety.   amiodarone 200 MG tablet Commonly known as: PACERONE Take 0.5 tablets (100 mg total) by mouth daily.   atorvastatin 40 MG tablet Commonly known as: LIPITOR Take 1 tablet (40 mg total) by mouth daily.   Eliquis 5 MG Tabs tablet Generic drug: apixaban Take 1 tablet by mouth twice daily   ezetimibe 10 MG tablet Commonly known as: Zetia Take 1 tablet (10 mg total) by mouth daily.   fluticasone 50 MCG/ACT nasal spray Commonly known as: FLONASE Place 1 spray into both nostrils daily as needed for allergies.   insulin NPH Human 100 UNIT/ML injection Commonly known as: NOVOLIN N 10 units AC breakfast and 10 units AC supper   insulin regular 100 units/mL injection Commonly known as: NOVOLIN R 10 units w bkfst and 6 units w dinner What changed: additional instructions   loratadine 10 MG tablet Commonly known as: CLARITIN Take 10 mg by mouth daily as needed for allergies.   metoprolol succinate 25 MG 24 hr tablet Commonly known as: TOPROL-XL Take 1 tablet (25 mg total) by mouth daily. Take with or immediately following a meal.   mycophenolate 250 MG capsule Commonly known as: CELLCEPT Take 500 mg by mouth 2 (two) times daily.   nitroGLYCERIN 0.4 MG SL tablet Commonly known as: NITROSTAT Place 1 tablet (0.4 mg total) under the tongue every 5 (five) minutes as needed for chest pain.   ondansetron 4 MG tablet Commonly known as: ZOFRAN Take 1 tablet (4 mg total) by mouth every 8 (eight) hours as needed for nausea or vomiting.   predniSONE 5 MG tablet Commonly known as: DELTASONE Take 5 mg by mouth daily with breakfast.   tacrolimus 0.5 MG capsule Commonly  known as: PROGRAF Take 1 mg by mouth in the morning and at bedtime.   Torsemide 40 MG Tabs Take 40 mg by mouth daily. Start taking on: January 06, 2022   Verquvo 2.5 MG Tabs Generic drug: Vericiguat Take 2.5 mg by mouth daily.       Allergies  Allergen Reactions   Tape Rash    Use paper tape.    Trazodone And Nefazodone Palpitations    tachycardia   Mirtazapine     imbalance   Elemental Sulfur Rash   Sulfa Antibiotics Rash      The results of significant diagnostics from this hospitalization (including imaging, microbiology, ancillary and laboratory) are listed below for reference.    Significant Diagnostic Studies: DG Chest Portable 1 View  Result Date: 01/01/2022 CLINICAL DATA:  Shortness of breath.  Chest pain. EXAM: PORTABLE CHEST 1 VIEW COMPARISON:  12/14/2021 FINDINGS: Cardiac enlargement. There is diffuse increase interstitial markings identified bilaterally concerning for pulmonary edema. No airspace opacity scratch set no airspace consolidation. No signs of pleural effusion. IMPRESSION: Cardiac enlargement and pulmonary  edema. Correlate for any signs or symptoms of CHF. Electronically Signed   By: Kerby Moors M.D.   On: 01/01/2022 06:41   US Renal Transplant w/Doppler  Result Date: 12/14/2021 CLINICAL DATA:  Worsening kidney function.  Evaluate renal graft EXAM: ULTRASOUND OF RENAL TRANSPLANT WITH RENAL DOPPLER ULTRASOUND TECHNIQUE: Ultrasound examination of the renal transplant was performed with gray-scale, color and duplex doppler evaluation. COMPARISON:  11/19/2021 FINDINGS: Transplant kidney location: Right lower quadrant Transplant Kidney: Renal measurements: 9.8 x 6.2 x 6.8 cm = volume: 214.53m. Normal in size and parenchymal echogenicity. No evidence of hydronephrosis. No peri-transplant fluid collection seen. Well-circumscribed anechoic cyst is identified measuring 3.2 x 1.8 x 2.6 cm. Similar to previous exam. Color flow in the main renal artery:  Yes Color  flow in the main renal vein:  Yes Duplex Doppler Evaluation: Main Renal Artery Velocity: 93.1 cm/sec Main Renal Artery Resistive Index: 0.86 Venous waveform in main renal vein:  Present Intrarenal resistive index in upper pole:  0.78 (normal 0.6-0.8; equivocal 0.8-0.9; abnormal >= 0.9) Intrarenal resistive index in lower pole: 0.73 (normal 0.6-0.8; equivocal 0.8-0.9; abnormal >= 0.9) Bladder: Bladder is fully distended and appears normal. Ureteral jets not visualized however. Other findings: None. IMPRESSION: 1. Patent renal vasculature. The intra renal resistive indices in the upper and lower pole are within normal limits. 2. No hydronephrosis. Electronically Signed   By: TKerby MoorsM.D.   On: 12/14/2021 13:10   DG Chest 1 View  Result Date: 12/14/2021 CLINICAL DATA:  Multiple falls. EXAM: CHEST  1 VIEW COMPARISON:  November 16, 2021. FINDINGS: Stable cardiomediastinal silhouette. Both lungs are clear. Mildly displaced left sixth, seventh and eighth rib fractures. IMPRESSION: Mildly displaced left rib fractures as described above. No acute cardiopulmonary abnormality seen. Electronically Signed   By: JMarijo ConceptionM.D.   On: 12/14/2021 11:43   DG Foot Complete Right  Result Date: 12/14/2021 CLINICAL DATA:  Multiple falls. EXAM: RIGHT FOOT COMPLETE - 3+ VIEW COMPARISON:  None Available. FINDINGS: There is no evidence of fracture or dislocation. There is no evidence of arthropathy. Mild posterior calcaneal spurring is noted. Vascular calcifications are noted. IMPRESSION: No acute abnormality is noted. Electronically Signed   By: JMarijo ConceptionM.D.   On: 12/14/2021 11:40   DG Elbow Complete Right  Result Date: 12/14/2021 CLINICAL DATA:  Right elbow pain after fall. EXAM: RIGHT ELBOW - COMPLETE 3+ VIEW COMPARISON:  None Available. FINDINGS: There is no evidence of fracture, dislocation, or joint effusion. There is no evidence of arthropathy or other focal bone abnormality. Soft tissues are  unremarkable. IMPRESSION: Negative. Electronically Signed   By: JMarijo ConceptionM.D.   On: 12/14/2021 11:39   DG HIPS BILAT WITH PELVIS MIN 5 VIEWS  Result Date: 12/14/2021 CLINICAL DATA:  Multiple falls. EXAM: DG HIP (WITH OR WITHOUT PELVIS) 5+V BILAT COMPARISON:  October 29, 2021. FINDINGS: There is no evidence of hip fracture or dislocation. Mild to moderate osteophyte formation is seen involving both hips. 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 No acute abnormality seen. Electronically Signed   By: JMarijo ConceptionM.D.   On: 12/14/2021 11:38    Microbiology: Recent Results (from the past 240 hour(s))  Resp Panel by RT-PCR (Flu A&B, Covid) Anterior Nasal Swab     Status: Abnormal   Collection Time: 01/01/22  8:01 AM   Specimen: Anterior Nasal Swab  Result Value Ref Range Status   SARS Coronavirus 2 by  RT PCR POSITIVE (A) NEGATIVE Final    Comment: (NOTE) SARS-CoV-2 target nucleic acids are DETECTED.  The SARS-CoV-2 RNA is generally detectable in upper respiratory specimens during the acute phase of infection. Positive results are indicative of the presence of the identified virus, but do not rule out bacterial infection or co-infection with other pathogens not detected by the test. Clinical correlation with patient history and other diagnostic information is necessary to determine patient infection status. The expected result is Negative.  Fact Sheet for Patients: EntrepreneurPulse.com.au  Fact Sheet for Healthcare Providers: IncredibleEmployment.be  This test is not yet approved or cleared by the [place] FDA and  has been authorized for detection and/or diagnosis of SARS-CoV-2 by FDA under an Emergency Use Authorization (EUA).  This EUA will remain in effect (meaning this test can be used) for the duration of  the COVID-19 declaration under Section 564(b)(1) of the A ct, 21 U.S.C. section  360bbb-3(b)(1), unless the authorization is terminated or revoked sooner.     Influenza A by PCR NEGATIVE NEGATIVE Final   Influenza B by PCR NEGATIVE NEGATIVE Final    Comment: (NOTE) The Xpert Xpress SARS-CoV-2/FLU/RSV plus assay is intended as an aid in the diagnosis of influenza from Nasopharyngeal swab specimens and should not be used as a sole basis for treatment. Nasal washings and aspirates are unacceptable for Xpert Xpress SARS-CoV-2/FLU/RSV testing.  Fact Sheet for Patients: EntrepreneurPulse.com.au  Fact Sheet for Healthcare Providers: IncredibleEmployment.be  This test is not yet approved or cleared by the [place] FDA and has been authorized for detection and/or diagnosis of SARS-CoV-2 by FDA under an Emergency Use Authorization (EUA). This EUA will remain in effect (meaning this test can be used) for the duration of the COVID-19 declaration under Section 564(b)(1) of the Act, 21 U.S.C. section 360bbb-3(b)(1), unless the authorization is terminated or revoked.  Performed at [facility], [place] [address], [place], [place] [address]      Labs: Basic Metabolic Panel: Recent Labs  Lab 01/01/22 0547 01/01/22 0555 01/02/22 0057 01/03/22 0056 01/04/22 0044 01/05/22 0050  NA 137 134* 133* 130* 128* 126*  K 5.2* 5.1 5.5* 4.8 4.7 4.4  CL 100 103 102 100 96* 96*  CO2 22  --  23 22 20* 21*  GLUCOSE 216* 217* 193* 212* 184* 198*  BUN 48* 44* 53* 61* 71* 69*  CREATININE 2.11* 2.20* 2.14* 2.35* 2.48* 2.34*  CALCIUM 8.5*  --  8.3* 7.8* 7.7* 7.7*  MG 2.1  --   --   --   --   --    Liver Function Tests: Recent Labs  Lab 01/01/22 0547  AST 23  ALT 15  ALKPHOS 70  BILITOT 1.3*  PROT 6.0*  ALBUMIN 3.5   No results for input(s): "LIPASE", "AMYLASE" in the last 168 hours. No results for input(s): "AMMONIA" in the last 168 hours. CBC: Recent Labs  Lab 01/01/22 0547 01/01/22 0555 01/02/22 0057 01/03/22 0056  01/04/22 0044 01/05/22 0050  WBC 4.9  --  2.8* 2.4* 2.1* 2.2*  NEUTROABS 4.3  --   --   --  1.0*  --   HGB 12.0* 13.6 10.6* 9.7* 9.3* 9.9*  HCT 40.5 40.0 35.5* 30.1* 29.9* 32.5*  MCV 93.5  --  92.7 90.4 89.8 89.5  PLT 112*  --  73* 60* 62* 74*   Cardiac Enzymes: No results for input(s): "CKTOTAL", "CKMB", "CKMBINDEX", "TROPONINI" in the last 168 hours. BNP: BNP (last 3 results) Recent Labs  12/14/21 1101 12/28/21 1615 01/01/22 0547  BNP >4,500.0* >5000.0* 3,800.9*    ProBNP (last 3 results) No results for input(s): "PROBNP" in the last 8760 hours.  CBG: Recent Labs  Lab 01/04/22 1143 01/04/22 1530 01/04/22 2045 01/05/22 0011 01/05/22 0643  GLUCAP 163* 182* 177* 177* 172*       Signed:  Domenic Polite MD.  [facility] 01/05/2022, 11:14 AM

## 2022-01-06 ENCOUNTER — Telehealth: Payer: Self-pay | Admitting: *Deleted

## 2022-01-06 NOTE — Patient Outreach (Signed)
  Care Coordination [facility] Note Transition Care Management Follow-up Telephone Call Date of discharge and from where: [facility] on 01/05/22 How have you been since you were released from the hospital? Per daughter, [name], "He's actually doing really good today. He's eating and walking around today. He's much better since being home. I think having COVID was really hard on him." Any questions or concerns? No  Items Reviewed: Did the pt receive and understand the discharge instructions provided? Yes  Medications obtained and verified? Yes  Torsemide '40mg'$   Other? No  Any new allergies since your discharge? No  Dietary orders reviewed? Yes Do you have support at home? Yes   Home Care and Equipment/Supplies: Were home health services ordered? no If so, what is the name of the agency?   Has the agency set up a time to come to the patient's home? not applicable Were any new equipment or medical supplies ordered?  No What is the name of the medical supply agency?  Were you able to get the supplies/equipment? not applicable Do you have any questions related to the use of the equipment or supplies? No  Functional Questionnaire: (I = Independent and D = Dependent) ADLs: I  Bathing/Dressing- I  Meal Prep- D  Eating- I  Maintaining continence- I  Transferring/Ambulation- I  Managing Meds- I  Follow up appointments reviewed:  PCP Hospital f/u appt confirmed? Yes  Scheduled to see [place] [facility] 01/12/22 at 11:50 [place] [facility] f/u appt confirmed? Yes  Scheduled to see Dr Joseph Berkshire (nephrology) 01/07/22 at 10:00 and [facility] 01/08/22 at 2:30 Are transportation arrangements needed? No  If their condition worsens, is the pt aware to call PCP or go to the Emergency Dept.? Yes Was the patient provided with contact information for the PCP's office or ED? Yes Was to pt encouraged to call back with questions or concerns? Yes  SDOH assessments and interventions completed:   Yes SDOH Interventions Today     Flowsheet Row Most Recent Value  SDOH Interventions   Housing Interventions Intervention Not Indicated  Transportation Interventions Intervention Not Indicated  Financial Strain Interventions Intervention Not Indicated       Care Coordination Interventions:  No Care Coordination interventions needed at this time.   Encounter Outcome:  Pt. Visit Completed    [name], BSN, RN-BC RN Care Coordinator [place] Direct Dial: [phone] Main #: [phone]

## 2022-01-07 DIAGNOSIS — D631 Anemia in chronic kidney disease: Secondary | ICD-10-CM | POA: Insufficient documentation

## 2022-01-07 NOTE — Patient Instructions (Signed)
Visit Information  Thank you for taking time to visit with me today. Please don't hesitate to contact me if I can be of assistance to you.   Following are the goals we discussed today:   Goals Addressed               This Visit's Progress     Patient Stated     manage Congestive heart failure (THN) (pt-stated)   On track     Care Coordination Interventions: Advised patient to weigh each morning after emptying bladder Discussed importance of daily weight and advised patient to weigh and record daily        Our next appointment is by telephone on 12/28/21 at 1130  Please call the care guide team at [phone] if you need to cancel or reschedule your appointment.   If you are experiencing a Mental Health or [place] or need someone to talk to, please call the Suicide and Crisis Lifeline: 988 call the [place] National Suicide Prevention Lifeline: (819)273-9738 or TTY: 858-204-0939 TTY 217-744-1610) to talk to a trained counselor call 1-800-273-TALK (toll free, 24 hour hotline) call the [facility]: (657)662-6852 call 911   Patient verbalizes understanding of instructions and care plan provided today and agrees to view in [place]. Active MyChart status and patient understanding of how to access instructions and care plan via MyChart confirmed with patient.     The patient has been provided with contact information for the care management team and has been advised to call with any health related questions or concerns.   Delight Bickle L. Lavina Hamman, RN, BSN, [place] Coordinator Office number [phone]

## 2022-01-08 ENCOUNTER — Inpatient Hospital Stay (HOSPITAL_COMMUNITY): Payer: Medicare Other

## 2022-01-12 ENCOUNTER — Ambulatory Visit: Payer: Self-pay | Admitting: *Deleted

## 2022-01-12 ENCOUNTER — Ambulatory Visit (INDEPENDENT_AMBULATORY_CARE_PROVIDER_SITE_OTHER): Payer: Medicare Other | Admitting: Family Medicine

## 2022-01-12 ENCOUNTER — Encounter: Payer: Self-pay | Admitting: Family Medicine

## 2022-01-12 VITALS — BP 137/69 | HR 66 | Temp 98.4°F | Ht 70.0 in | Wt 194.2 lb

## 2022-01-12 DIAGNOSIS — I5043 Acute on chronic combined systolic (congestive) and diastolic (congestive) heart failure: Secondary | ICD-10-CM | POA: Diagnosis not present

## 2022-01-12 DIAGNOSIS — N184 Chronic kidney disease, stage 4 (severe): Secondary | ICD-10-CM

## 2022-01-12 DIAGNOSIS — Z09 Encounter for follow-up examination after completed treatment for conditions other than malignant neoplasm: Secondary | ICD-10-CM

## 2022-01-12 DIAGNOSIS — I214 Non-ST elevation (NSTEMI) myocardial infarction: Secondary | ICD-10-CM | POA: Diagnosis not present

## 2022-01-12 DIAGNOSIS — Z8616 Personal history of COVID-19: Secondary | ICD-10-CM

## 2022-01-12 DIAGNOSIS — Z940 Kidney transplant status: Secondary | ICD-10-CM

## 2022-01-12 NOTE — Patient Instructions (Signed)
Continue to take medications exactly as prescribed Keep follow up visit with Dr Aundra Dubin and Dr Livia Snellen Do the deep breathing exercises we discussed to keep mucus moving out of the lungs after covid Continue to watch weights If you decide you do want physical therapy, then please let us know.

## 2022-01-12 NOTE — Progress Notes (Signed)
Subjective: IR:CVELFYBO discharge follow up PCP: Claretta Fraise, MD FBP:ZWCHENI L [place] is a 76 y.o. male presenting to clinic today for:  1. Hospital discharge follow up Patient is brought to the office by his daughter.  He resides independently at home but both his daughters live very closely nearby and check on him frequently.  He prepares some of his meals and avoids excess salt.    He was recently admitted and discharged from the hospital for acute on chronic combined heart failure, COVID-19 infection.  He notes that he has done quite well since discharge from the hospital.  He feels like the torsemide is working better for him.  Having good urine output.  Has baseline ankle edema but this is much improved from hospitalization.  He weighs daily and his weight has not fluctuated by more than half a pound since discharge.  He has a home health nurse coming in to do vitals but declines any physical therapy.  He continues to decline any palliative intervention.  Denies any shortness of breath and has not needed his albuterol at all.  He saw his renal doctor since discharge and had labs drawn then.  Has an appointment with his advanced heart failure specialist in 2 weeks.  Overall has no complaints today.   ROS: Per HPI  Allergies  Allergen Reactions   Other Rash and Palpitations    Use paper tape.  tachycardia   Tape Rash    Use paper tape.    Trazodone And Nefazodone Palpitations    tachycardia   Mirtazapine Other (See Comments)    imbalance   Elemental Sulfur Rash   Sulfa Antibiotics Rash   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   nodular on left jawline - excision   Basal cell carcinoma 07/01/2014   nodular on left hawling - CX3+5FU+excision   Basal cell carcinoma 10/10/2014   nodular on right forearm, middle - tx p bx   Basal cell carcinoma 02/11/2015   left neck - CX3 + excision   Basal cell carcinoma 06/14/2016   left jawline - CX3+5FU    Basal cell carcinoma 02/22/2017   superficial and nodular on left neck - excision   Basal cell carcinoma 04/11/2018   superficial and nodular on right inferior forearm - CX3+Cautery+5FU   Chronic kidney disease    History of renal transplant    Hyperlipidemia    Hypertension    NSTEMI (non-ST elevated myocardial infarction) ([place]) 02/13/2020   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   Left [place]ek (well diff)   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   Right Breast (in situ)   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   Left Forearm - anterior (mod diff)   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   Neck - anterior (mod diff)   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   in situ on left arm - CX3+5FU   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   hypertrophic on left ear - CX3+5FU   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   in situ on right temple - CX3+5FU   Squamous cell carcinoma of skin 11/08/2011   right upper forearm - tx p bx   Squamous cell carcinoma of skin 11/08/2011   left upper forearm - tx p bx   Squamous cell carcinoma of skin 11/08/2011   left hand - tx p bx   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/27/2013  in situ on left lower back - CX3+5FU   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/27/2013   in situ on left forehead - CX3+5FU   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/27/2013   in situ on right temple - watch per ST   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/27/2013   in situ on right forearm - CX3+5FU   Squamous cell carcinoma of skin 07/01/2014   well differentiated on right sideburn - CX3+5FU   Squamous cell carcinoma of skin 07/01/2014   in situ on left shoulder - CX3+5FU   Squamous cell carcinoma of skin 07/01/2014   in situ on right forearm, proximal - CX3+5FU+Cautery   Squamous cell carcinoma of skin 07/01/2014   in situ on right forearm, distal - CX3+5FU   Squamous cell carcinoma of skin 09/30/2015   in situ on  posterior left ear - CX3+5FU   Squamous cell carcinoma of skin 02/22/2017   in situ on right upper arm - CX3+5FU   Squamous cell carcinoma of skin 02/22/2017   in situ on left upper arm - CX3+5FU   Squamous cell carcinoma of skin 04/11/2018   in situ on right temple - CX3+5FU   Squamous cell carcinoma of skin 04/11/2018   in situ on lateral right arm - MOHs   Squamous cell carcinoma of skin 04/11/2018   in situ on right upper arm - MOHs   Squamous cell carcinoma of skin 04/11/2018   in situ on left sideburn   Squamous cell carcinoma of skin 04/11/2018   in situ on right flank - tx p bx   Squamous cell carcinoma of skin 09/28/2018   in situ on right inner ear - tx p bx   Squamous cell carcinoma of skin 09/28/2018   in situ on left inner ear - tx p bx   Squamous cell carcinoma of skin 09/28/2018   in situ on right arm - tx p bx   Squamous cell carcinoma of skin 03/21/2019   in situ on right antihelix ([place] treated topically)   Squamous cell carcinoma of skin 03/21/2019   in situ on right neck - CX3+5FU   Squamous cell carcinoma of skin 03/21/2019   in situ on left outer eye, inf (MOHs done 05/23/2019)   Type 2 diabetes mellitus ([place])    Unspecified atrial fibrillation ([place]) 02/17/2020    Current Outpatient Medications:    acetaminophen (TYLENOL) 500 MG tablet, Take 500 mg by mouth every 8 (eight) hours as needed for moderate pain., Disp: , Rfl:    albuterol (VENTOLIN HFA) 108 (90 Base) MCG/ACT inhaler, Inhale 2 puffs into the lungs every 6 (six) hours as needed for wheezing or shortness of breath., Disp: 1 each, Rfl: 3   ALPRAZolam (XANAX) 0.25 MG tablet, Take 1 tablet (0.25 mg total) by mouth at bedtime as needed for anxiety., Disp: 90 tablet, Rfl: 1   amiodarone (PACERONE) 200 MG tablet, Take 0.5 tablets (100 mg total) by mouth daily., Disp: 45 tablet, Rfl: 3   atorvastatin (LIPITOR) 40 MG tablet, Take 1 tablet (40 mg total) by mouth daily., Disp: 90 tablet, Rfl: 3   ELIQUIS 5  MG TABS tablet, Take 1 tablet by mouth twice daily, Disp: 60 tablet, Rfl: 11   ezetimibe (ZETIA) 10 MG tablet, Take 1 tablet (10 mg total) by mouth daily., Disp: 90 tablet, Rfl: 3   fluticasone (FLONASE) 50 MCG/ACT nasal spray, Place 1 spray into both nostrils daily as needed for allergies., Disp: , Rfl:  insulin NPH Human (NOVOLIN N) 100 UNIT/ML injection, 10 units AC breakfast and 10 units AC supper, Disp: 30 mL, Rfl: 11   insulin regular (NOVOLIN R) 100 units/mL injection, 10 units w bkfst and 6 units w dinner, Disp: 10 mL, Rfl: 5   loratadine (CLARITIN) 10 MG tablet, Take 10 mg by mouth daily as needed for allergies., Disp: , Rfl:    metoprolol succinate (TOPROL-XL) 25 MG 24 hr tablet, Take 1 tablet (25 mg total) by mouth daily. Take with or immediately following a meal., Disp: 30 tablet, Rfl: 2   mycophenolate (CELLCEPT) 250 MG capsule, Take 500 mg by mouth 2 (two) times daily., Disp: , Rfl:    nitroGLYCERIN (NITROSTAT) 0.4 MG SL tablet, Place 1 tablet (0.4 mg total) under the tongue every 5 (five) minutes as needed for chest pain., Disp: 25 tablet, Rfl: 3   ondansetron (ZOFRAN) 4 MG tablet, Take 1 tablet (4 mg total) by mouth every 8 (eight) hours as needed for nausea or vomiting., Disp: 20 tablet, Rfl: 0   predniSONE (DELTASONE) 5 MG tablet, Take 5 mg by mouth daily with breakfast., Disp: , Rfl:    tacrolimus (PROGRAF) 0.5 MG capsule, Take 1 mg by mouth in the morning and at bedtime., Disp: , Rfl:    torsemide (DEMADEX) 20 MG tablet, Take 2 tablets (40 mg total) by mouth daily., Disp: 60 tablet, Rfl: 0   Vericiguat (VERQUVO) 2.5 MG TABS, Take 2.5 mg by mouth daily., Disp: 30 tablet, Rfl: 6 Social History   Socioeconomic History   Marital status: Widowed    Spouse name: Not on file   Number of children: 3   Years of education: Not on file   Highest education level: Not on file  Occupational History   Not on file  Tobacco Use   Smoking status: Some Days    Types: Pipe   Smokeless  tobacco: Never  Vaping Use   Vaping Use: Never used  Substance and Sexual Activity   Alcohol use: Never   Drug use: Never   Sexual activity: Not Currently  Other Topics Concern   Not on file  Social History Narrative   Wife passed away in 2007-05-18.   2 daughters, 1 son. All live close by.    3 grandchildren.    Social Determinants of Health   Financial Resource Strain: Low Risk  (01/12/2022)   Overall Financial Resource Strain (CARDIA)    Difficulty of Paying Living Expenses: Not hard at all  Food Insecurity: No Food Insecurity (01/12/2022)   Hunger Vital Sign    Worried About Running Out of Food in the Last Year: Never true    Ran Out of Food in the Last Year: Never true  Transportation Needs: No Transportation Needs (01/12/2022)   PRAPARE - Hydrologist (Medical): No    Lack of Transportation (Non-Medical): No  Physical Activity: Insufficiently Active (04/15/2021)   Exercise Vital Sign    Days of Exercise per Week: 7 days    Minutes of Exercise per Session: 20 min  Stress: No Stress Concern Present (01/12/2022)   [place]    Feeling of Stress : Not at all  Social Connections: Moderately Integrated (01/12/2022)   Social Connection and Isolation Panel [NHANES]    Frequency of Communication with Friends and Family: More than three times a week    Frequency of Social Gatherings with Friends and Family: More than three times a  week    Attends Religious Services: 1 to 4 times per year    Active Member of Clubs or Organizations: No    Attends Archivist Meetings: 1 to 4 times per year    Marital Status: Widowed  Intimate Partner Violence: Not At Risk (01/12/2022)   Humiliation, Afraid, Rape, and Kick questionnaire    Fear of Current or Ex-Partner: No    Emotionally Abused: No    Physically Abused: No    Sexually Abused: No   Family History  Problem Relation Age of Onset    Clotting disorder Mother    Hypertension Sister    Diabetes Sister     Objective: Office vital signs reviewed. BP 137/69   Pulse 66   Temp 98.4 F (36.9 C)   Ht '5\' 10"'$  (1.778 m)   Wt 194 lb 3.2 oz (88.1 kg)   SpO2 94%   BMI 27.86 kg/m   Physical Examination:  General: Awake, alert, nontoxic male, No acute distress HEENT: Sclera white.  Moist mucous membranes Cardio: regular rate and rhythm, S1S2 heard, no murmurs appreciated Pulm: Mild rhonchi in the in the right upper lung.  Intermittent crackles appreciated at bilateral lower lung fields that clear with cough.  Normal work of breathing on room air.  No wheezes. Extremities: Warm, well perfused.  +1 pitting edema to ankles bilaterally. MSK: arrives in wheelchair  Assessment/ Plan: 76 y.o. male   Acute on chronic combined systolic and diastolic congestive heart failure [facility])  Hospital discharge follow-up  CKD (chronic kidney disease) stage 4, GFR 15-29 ml/min ([place])  Renal transplant recipient  Personal history of COVID-19  Surprisingly looking well-appearing after discharge from the hospital.  He seems to be doing well and had no concerns today.  From a fluid standpoint, he did have 1+ pitting edema to bilateral ankles but this is apparently his baseline.  Continue to take the torsemide as prescribed by his specialist.  Follow-up with Dr. Aundra Dubin as scheduled in 2 weeks.  Keep appointment with PCP January 4  Recommended deep breathing exercises to avoid any post COVID complications.  Continue monitoring weights closely.  Follow-up sooner if any concerning symptoms or signs occur.  No orders of the defined types were placed in this encounter.  No orders of the defined types were placed in this encounter.   Today's visit is for Transitional Care Management.  The patient was discharged from [facility] on [facility]/2022 with a primary diagnosis of Acute on Chronic Heart Failure/ NSTEMI/ Severe CAD/ COVID19.   Contact with  the patient and/or caregiver, by a clinical staff member, was made on 01/06/2022 and was documented as a telephone encounter within the EMR.  Through chart review and discussion with the patient I have determined that management of their condition is of moderate complexity.    Janora Norlander, DO [place] [phone]

## 2022-01-12 NOTE — Patient Outreach (Signed)
  Care Coordination   Follow Up Visit Note   01/12/2022 Name: Daniel Myers MRN: [id number] DOB: 26-Nov-1945  Daniel Myers is a 76 y.o. year old male who sees Stacks, [name], MD for primary care. I spoke with Daniel Myers, daughter of VEGAS FRITZE by phone today.  What matters to the patients health and wellness today?  Overall Daniel Myers reports Mr Cronkright with improvements after his recent admission for NSTEMI, COVID & CHF. " He is actually doing well"  Reviewed [facility] RN CM services again with review of possible needs to assist with  Denies any present needs Encouraged to mention any that is thought of later to pcp during today's office visit Confirmed appointments rescheduled & attended visits to nephrology   Goals Addressed               This Visit's Progress     Patient Stated     increase hemaglobin, decrease anemia fatigue (THN) (pt-stated)   On track     Care Coordination Interventions: Screening for signs and symptoms of depression related to chronic disease state Assessed social determinant of health barriers  Transition of care outreach was completed by [facility] staff after discharge Follow up to assess for any needs since last outreach Encouraged to outreach as needed after MD appointment Confirmed patient is overall doing better per daughter, Daniel Myers      manage Congestive heart failure (THN) (pt-stated)   On track     Care Coordination Interventions: Discussed the importance of keeping all appointments with provider Advised patient to discuss any need that [facility] may assist with that is thought of later with provider Screening for signs and symptoms of depression related to chronic disease state  Assessed social determinant of health barriers  Follow up to assess for any needs since last outreach Encouraged to outreach as needed after MD appointment Confirmed patient is overall doing better per daughter, Daniel Myers        SDOH assessments and interventions completed:  Yes  SDOH  Interventions Today    Flowsheet Row Most Recent Value  SDOH Interventions   Food Insecurity Interventions Intervention Not Indicated  Housing Interventions Intervention Not Indicated  Transportation Interventions Intervention Not Indicated  Utilities Interventions Intervention Not Indicated  Financial Strain Interventions Intervention Not Indicated  Stress Interventions Intervention Not Indicated  Social Connections Interventions Intervention Not Indicated        Care Coordination Interventions:  Yes, provided   Follow up plan: Follow up call scheduled for 02/23/22    Encounter Outcome:  Pt. Visit Completed   Shanyla Marconi L. Lavina Hamman, RN, BSN, [place] Coordinator Office number [phone]

## 2022-01-12 NOTE — Patient Instructions (Signed)
Visit Information  Thank you for taking time to visit with me today. Please don't hesitate to contact me if I can be of assistance to you.   Following are the goals we discussed today:   Goals Addressed               This Visit's Progress     Patient Stated     increase hemaglobin, decrease anemia fatigue (THN) (pt-stated)   On track     Care Coordination Interventions: Screening for signs and symptoms of depression related to chronic disease state Assessed social determinant of health barriers  Transition of care outreach was completed by [facility] staff after discharge Follow up to assess for any needs since last outreach Encouraged to outreach as needed after MD appointment Confirmed patient is overall doing better per daughter, [name]      manage Congestive heart failure (THN) (pt-stated)   On track     Care Coordination Interventions: Discussed the importance of keeping all appointments with provider Advised patient to discuss any need that [place] may assist with that is thought of later with provider Screening for signs and symptoms of depression related to chronic disease state  Assessed social determinant of health barriers  Follow up to assess for any needs since last outreach Encouraged to outreach as needed after MD appointment Confirmed patient is overall doing better per daughter, Claudine Mouton Education on CHF, CKD, covid home prevention via today's after summary visit         Our next appointment is by telephone on 02/23/22 at 1000  Please call the care guide team at [phone] if you need to cancel or reschedule your appointment.   If you are experiencing a Mental Health or [place] or need someone to talk to, please call the Suicide and Crisis Lifeline: 988 call the [place] National Suicide Prevention Lifeline: 703 814 4480 or TTY: 859-512-3198 TTY (606)427-9132) to talk to a trained counselor call 1-800-273-TALK (toll free, 24 hour hotline) call the  [facility]: [phone] call 911   Patient verbalizes understanding of instructions and care plan provided today and agrees to view in [place]. Active MyChart status and patient understanding of how to access instructions and care plan via MyChart confirmed with patient.     The patient has been provided with contact information for the care management team and has been advised to call with any health related questions or concerns.   Talbot Monarch L. Lavina Hamman, RN, BSN, [place] Coordinator Office number [phone]

## 2022-01-13 ENCOUNTER — Encounter (HOSPITAL_COMMUNITY): Payer: Medicare Other

## 2022-01-18 ENCOUNTER — Ambulatory Visit (INDEPENDENT_AMBULATORY_CARE_PROVIDER_SITE_OTHER): Payer: Medicare Other | Admitting: Family Medicine

## 2022-01-18 ENCOUNTER — Encounter: Payer: Self-pay | Admitting: Family Medicine

## 2022-01-18 VITALS — BP 113/62 | HR 78 | Temp 97.8°F | Ht 70.0 in | Wt 194.0 lb

## 2022-01-18 DIAGNOSIS — R310 Gross hematuria: Secondary | ICD-10-CM

## 2022-01-18 DIAGNOSIS — R300 Dysuria: Secondary | ICD-10-CM

## 2022-01-18 DIAGNOSIS — I2510 Atherosclerotic heart disease of native coronary artery without angina pectoris: Secondary | ICD-10-CM

## 2022-01-18 LAB — URINALYSIS, COMPLETE
Bilirubin, UA: NEGATIVE
Glucose, UA: NEGATIVE
Nitrite, UA: POSITIVE — AB
Specific Gravity, UA: 1.015 (ref 1.005–1.030)
Urobilinogen, Ur: 2 mg/dL — ABNORMAL HIGH (ref 0.2–1.0)
pH, UA: 5.5 (ref 5.0–7.5)

## 2022-01-18 LAB — MICROSCOPIC EXAMINATION
Epithelial Cells (non renal): NONE SEEN /hpf (ref 0–10)
RBC, Urine: >30 /hpf — AB (ref 0–2)
Renal Epithel, UA: NONE SEEN /hpf
WBC, UA: >30 /hpf — AB (ref 0–5)

## 2022-01-18 MED ORDER — DOXYCYCLINE HYCLATE 100 MG PO CAPS: 100.0000 mg | ORAL_CAPSULE | Freq: Two times a day (BID) | ORAL | 0 refills | Status: DC

## 2022-01-18 NOTE — Progress Notes (Signed)
Subjective:  Patient ID: Daniel Myers, male    DOB: [birth year]  Age: 76 y.o. MRN: [id number]  CC: Dysuria   HPI Daniel Myers presents for 2 days of hematuria, dysuria and frequency. Started smelling strong yesterday.Noted blood visible in urinelast night. No flank pain. Denies fever. Takes eliquis to thin blood.     01/18/2022   12:42 PM 01/12/2022   11:50 AM 01/12/2022   10:12 AM  Depression screen PHQ 2/9  Decreased Interest 0 0 0  Down, Depressed, Hopeless 0 0 0  PHQ - 2 Score 0 0 0    History Daniel Myers has a past medical history of Basal cell carcinoma (06/27/2013), Basal cell carcinoma (07/01/2014), Basal cell carcinoma (10/10/2014), Basal cell carcinoma (02/11/2015), Basal cell carcinoma (06/14/2016), Basal cell carcinoma (02/22/2017), Basal cell carcinoma (04/11/2018), Chronic kidney disease, History of renal transplant, Hyperlipidemia, Hypertension, NSTEMI (non-ST elevated myocardial infarction) ([place]) (02/13/2020), 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), 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), 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), 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), 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), 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), 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), Squamous cell carcinoma of skin (11/08/2011), Squamous cell carcinoma of skin (11/08/2011), Squamous cell carcinoma of skin (11/08/2011), 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), 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), 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), 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), 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), 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), 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), 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), Squamous cell carcinoma of skin (09/30/2015), Squamous cell carcinoma of skin (02/22/2017), Squamous cell carcinoma of skin  (02/22/2017), Squamous cell carcinoma of skin (04/11/2018), Squamous cell carcinoma of skin (04/11/2018), Squamous cell carcinoma of skin (04/11/2018), Squamous cell carcinoma of skin (04/11/2018), Squamous cell carcinoma of skin (04/11/2018), Squamous cell carcinoma of skin (09/28/2018), Squamous cell carcinoma of skin (09/28/2018), Squamous cell carcinoma of skin (09/28/2018), Squamous cell carcinoma of skin (03/21/2019), Squamous cell carcinoma of skin (03/21/2019), 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   He has a past surgical history that includes Kidney transplant (Right); RIGHT/LEFT HEART CATH AND CORONARY ANGIOGRAPHY (N/A, 09/09/2020); and Cardioversion (N/A, 10/16/2020).   His family history includes Clotting disorder in his mother; Diabetes in his sister; Hypertension in his sister.He reports that he has been smoking pipe. He has never used smokeless tobacco. He reports that he does not drink alcohol and does not use drugs.    ROS Review of Systems  Constitutional:  Negative for fever.  Respiratory:  Negative for shortness of breath.   Cardiovascular:  Negative for chest pain.  Musculoskeletal:  Negative for arthralgias.  Skin:  Negative for rash.    Objective:  BP 113/62   Pulse 78   Temp 97.8 F (36.6 C)   Ht _0  (1.778 m)   Wt 194 lb (88 kg)   SpO2 94%   BMI 27.84 kg/m   BP Readings from Last 3 Encounters:  01/18/22 113/62  01/12/22 137/69  01/05/22 (!) 131/57    Wt Readings from Last 3 Encounters:  01/18/22 194 lb (88 kg)  01/12/22 194 lb 3.2 oz (88.1 kg)  01/05/22 197 lb 3.2 oz (89.4 kg)     Physical Exam Vitals reviewed.  Constitutional:      Appearance: He is well-developed.  HENT:     Head: Normocephalic and atraumatic.     Right  Ear: External ear normal.     Left Ear: External ear normal.     Mouth/Throat:     Pharynx: No oropharyngeal exudate or  posterior oropharyngeal erythema.  Eyes:     Pupils: Pupils are equal, round, and reactive to light.  Cardiovascular:     Rate and Rhythm: Normal rate and regular rhythm.     Heart sounds: No murmur heard. Pulmonary:     Effort: No respiratory distress.     Breath sounds: Normal breath sounds.  Musculoskeletal:     Cervical back: Normal range of motion and neck supple.  Neurological:     Mental Status: He is alert and oriented to person, place, and time.       Assessment & Plan:   Daniel Myers was seen today for dysuria.  Diagnoses and all orders for this visit:  Gross hematuria -     CMP14+EGFR -     CBC with Differential/Platelet -     doxycycline (VIBRAMYCIN) 100 MG capsule; Take 1 capsule (100 mg total) by mouth 2 (two) times daily. -     Microscopic Examination  Dysuria -     Urinalysis, Complete -     Urine Culture -     CMP14+EGFR -     CBC with Differential/Platelet -     doxycycline (VIBRAMYCIN) 100 MG capsule; Take 1 capsule (100 mg total) by mouth 2 (two) times daily. -     Microscopic Examination       I am having Daniel Myers start on doxycycline. I am also having him maintain his tacrolimus, mycophenolate, loratadine, predniSONE, ondansetron, acetaminophen, nitroGLYCERIN, insulin NPH Human, ezetimibe, amiodarone, Eliquis, atorvastatin, albuterol, Verquvo, fluticasone, metoprolol succinate, ALPRAZolam, torsemide, insulin regular, and sulfamethoxazole-trimethoprim.  Allergies as of 01/18/2022       Reactions   Other Rash, Palpitations   Use paper tape. tachycardia   Tape Rash   Use paper tape.    Trazodone And Nefazodone Palpitations   tachycardia   Mirtazapine Other (See Comments)   imbalance   Elemental Sulfur Rash   Sulfa Antibiotics Rash        Medication List        Accurate as of January 18, 2022 11:02 PM. If you have any questions, ask your nurse or doctor.          acetaminophen 500 MG tablet Commonly known as: TYLENOL Take  500 mg by mouth every 8 (eight) hours as needed for moderate pain.   albuterol 108 (90 Base) MCG/ACT inhaler Commonly known as: VENTOLIN HFA Inhale 2 puffs into the lungs every 6 (six) hours as needed for wheezing or shortness of breath.   ALPRAZolam 0.25 MG tablet Commonly known as: XANAX Take 1 tablet (0.25 mg total) by mouth at bedtime as needed for anxiety.   amiodarone 200 MG tablet Commonly known as: PACERONE Take 0.5 tablets (100 mg total) by mouth daily.   atorvastatin 40 MG tablet Commonly known as: LIPITOR Take 1 tablet (40 mg total) by mouth daily.   doxycycline 100 MG capsule Commonly known as: Vibramycin Take 1 capsule (100 mg total) by mouth 2 (two) times daily. Started by: Claretta Fraise, MD   Eliquis 5 MG Tabs tablet Generic drug: apixaban Take 1 tablet by mouth twice daily   ezetimibe 10 MG tablet Commonly known as: Zetia Take 1 tablet (10 mg total) by mouth daily.   fluticasone 50 MCG/ACT nasal spray Commonly known as: FLONASE Place 1 spray into both nostrils daily as needed  for allergies.   insulin NPH Human 100 UNIT/ML injection Commonly known as: NOVOLIN N 10 units AC breakfast and 10 units AC supper   insulin regular 100 units/mL injection Commonly known as: NOVOLIN R 10 units w bkfst and 6 units w dinner   loratadine 10 MG tablet Commonly known as: CLARITIN Take 10 mg by mouth daily as needed for allergies.   metoprolol succinate 25 MG 24 hr tablet Commonly known as: TOPROL-XL Take 1 tablet (25 mg total) by mouth daily. Take with or immediately following a meal.   mycophenolate 250 MG capsule Commonly known as: CELLCEPT Take 500 mg by mouth 2 (two) times daily.   nitroGLYCERIN 0.4 MG SL tablet Commonly known as: NITROSTAT Place 1 tablet (0.4 mg total) under the tongue every 5 (five) minutes as needed for chest pain.   ondansetron 4 MG tablet Commonly known as: ZOFRAN Take 1 tablet (4 mg total) by mouth every 8 (eight) hours as needed  for nausea or vomiting.   predniSONE 5 MG tablet Commonly known as: DELTASONE Take 5 mg by mouth daily with breakfast.   sulfamethoxazole-trimethoprim 400-80 MG tablet Commonly known as: BACTRIM Take 1 tablet by mouth 2 (two) times daily.   tacrolimus 0.5 MG capsule Commonly known as: PROGRAF Take 1 mg by mouth in the morning and at bedtime.   torsemide 20 MG tablet Commonly known as: DEMADEX Take 2 tablets (40 mg total) by mouth daily.   Verquvo 2.5 MG Tabs Generic drug: Vericiguat Take 2.5 mg by mouth daily.         Follow-up: Return in about 1 month (around 02/18/2022).  Claretta Fraise, M.D.

## 2022-01-20 LAB — CMP14+EGFR
ALT: 10 IU/L (ref 0–44)
AST: 13 IU/L (ref 0–40)
Albumin/Globulin Ratio: 1.8 (ref 1.2–2.2)
Albumin: 3.3 g/dL — ABNORMAL LOW (ref 3.8–4.8)
Alkaline Phosphatase: 65 IU/L (ref 44–121)
BUN/Creatinine Ratio: 24 (ref 10–24)
BUN: 71 mg/dL — ABNORMAL HIGH (ref 8–27)
Bilirubin Total: 1.2 mg/dL (ref 0.0–1.2)
CO2: 19 mmol/L — ABNORMAL LOW (ref 20–29)
Calcium: 7.8 mg/dL — ABNORMAL LOW (ref 8.6–10.2)
Chloride: 98 mmol/L (ref 96–106)
Creatinine, Ser: 2.93 mg/dL — ABNORMAL HIGH (ref 0.76–1.27)
Globulin, Total: 1.8 g/dL (ref 1.5–4.5)
Glucose: 187 mg/dL — ABNORMAL HIGH (ref 70–99)
Potassium: 4.8 mmol/L (ref 3.5–5.2)
Sodium: 132 mmol/L — ABNORMAL LOW (ref 134–144)
Total Protein: 5.1 g/dL — ABNORMAL LOW (ref 6.0–8.5)
eGFR: 22 mL/min/{1.73_m2} — ABNORMAL LOW (ref >59)

## 2022-01-20 LAB — CBC WITH DIFFERENTIAL/PLATELET
Basophils Absolute: 0.1 10*3/uL (ref 0.0–0.2)
Basos: 1 %
EOS (ABSOLUTE): 0 10*3/uL (ref 0.0–0.4)
Eos: 0 %
Hematocrit: 29.1 % — ABNORMAL LOW (ref 37.5–51.0)
Hemoglobin: 9.2 g/dL — ABNORMAL LOW (ref 13.0–17.7)
Lymphocytes Absolute: 0.5 10*3/uL — ABNORMAL LOW (ref 0.7–3.1)
Lymphs: 8 %
MCH: 27.5 pg (ref 26.6–33.0)
MCHC: 31.6 g/dL (ref 31.5–35.7)
MCV: 87 fL (ref 79–97)
Monocytes Absolute: 0.3 10*3/uL (ref 0.1–0.9)
Monocytes: 5 %
Neutrophils Absolute: 4.8 10*3/uL (ref 1.4–7.0)
Neutrophils: 85 %
Platelets: 97 10*3/uL — CL (ref 150–450)
RBC: 3.35 x10E6/uL — ABNORMAL LOW (ref 4.14–5.80)
RDW: 14.8 % (ref 11.6–15.4)
WBC: 5.7 10*3/uL (ref 3.4–10.8)

## 2022-01-20 LAB — IMMATURE CELLS: Metamyelocytes: 1 % — ABNORMAL HIGH (ref 0–0)

## 2022-01-22 ENCOUNTER — Telehealth: Payer: Self-pay | Admitting: Nurse Practitioner

## 2022-01-22 ENCOUNTER — Encounter: Payer: Self-pay | Admitting: *Deleted

## 2022-01-22 ENCOUNTER — Other Ambulatory Visit: Payer: Self-pay | Admitting: Nurse Practitioner

## 2022-01-22 ENCOUNTER — Telehealth: Payer: Self-pay | Admitting: *Deleted

## 2022-01-22 ENCOUNTER — Telehealth: Payer: Self-pay | Admitting: Family Medicine

## 2022-01-22 DIAGNOSIS — N390 Urinary tract infection, site not specified: Secondary | ICD-10-CM

## 2022-01-22 LAB — URINE CULTURE

## 2022-01-22 MED ORDER — LEVOFLOXACIN 500 MG PO TABS: 500.0000 mg | ORAL_TABLET | Freq: Every day | ORAL | 0 refills | Status: DC

## 2022-01-22 NOTE — Telephone Encounter (Signed)
AZO over the counter, increase hydration , reduce coffee, and complete antibiotics give to him by PCP during visit. Follo  up with worsening unresolved symptoms

## 2022-01-22 NOTE — Patient Outreach (Signed)
  Care Coordination   Follow Up Visit Note   01/22/2022 Name: VANDELL KUN MRN: [id number] DOB: 1945-06-03  LEDARRIUS BEAUCHAINE is a 76 y.o. year old male who sees Stacks, [name], MD for primary care. I  spoke with daughter, Cecille Rubin, by telephone today  What matters to the patients health and wellness today?  Resolving UTI and preventing ED visit    Goals Addressed             This Visit's Progress    Treat UTI and Avoid ED Visit       Care Coordination Interventions: Incoming call from daughter, Cecille Rubin, regarding urine culture results that show Klebsiella is resistant to doxycycline. They have my number from previous CCM work with patient. Patient complains of increased pain today. They have been unable to get assistance from PCP on their own and reached out to me . They want to avoid the ED. Chart reviewed including urine culture results. PCP out of office today.  Reached out to covering provider, Jac Canavan, NP. Explained patient and daughter's concerns and that pharmacist at [facility] recommended Levaquin, which I verified Klebsiella is susceptible to.  [place] was very helpful and sent in a script to [facility] in [place] for [place]. Kept daughter on the phone during the process and made her aware of the new script. Verified that it did transmit electronically and she received a call from [facility] prior to hanging up with me Discussed fluid restriction of 2L per day. Encouraged to follow those restrictions but to drink as close to 2 L water (including fruits, foods with water, and other liquids, etc) to help flush out bladder Recommended non-concentrated, sugar free cranberry juice. Can drink this daily to help prevent UTI Strongly encouraged to reach out to PCP office and contact on-call provider if new or worsening symptoms Discussed potential worsening of symptoms to look out for  Advised to go to ED if he has any fever, chills, or flank pain since he has kidney transplant Discussed  management of bladder pain with provider and daughter. Unable to take NSAIDs. Taking extra strength tylenol 650 every 8 hrs. Advised that he can take 2 of those every 8 hours but no more than 6 in 24 hours.  Advised that pain should greatly improve after a couple doses of Levaquin Daughter expressed understanding and will follow-up as needed Thanked NP for her prompt attention and assistance         SDOH assessments and interventions completed:  No     Care Coordination Interventions:  Yes, provided   Follow up plan:  Follow-up scheduled with Joellyn Quails, RN Care Coordinator in January. Will loop her in and request that she follow-up sooner.     Encounter Outcome:  Pt. Visit Completed   [name], BSN, RN-BC RN Care Coordinator [place] Direct Dial: [phone] Main #: [phone]

## 2022-01-22 NOTE — Telephone Encounter (Signed)
Reviewed covering providers notes with daughter. Says they just saw pts culture results and doesn't think AZO is going to help. Says he really needs a stronger antibiotic and something for pain because he is in a lot of pain. Please advise.

## 2022-01-22 NOTE — Telephone Encounter (Signed)
Pts daughter called wanting to know if theres any advise that can be given on what pt can do to ease pain of UTI. Says he saw Dr Livia Snellen for it recently but is still in a lot of pain. Can covering provider advise?

## 2022-01-23 ENCOUNTER — Encounter: Payer: Self-pay | Admitting: Family Medicine

## 2022-01-23 MED ORDER — AMOXICILLIN-POT CLAVULANATE 875-125 MG PO TABS: 1.0000 | ORAL_TABLET | Freq: Two times a day (BID) | ORAL | 0 refills | Status: DC

## 2022-01-24 ENCOUNTER — Encounter: Payer: Self-pay | Admitting: Family Medicine

## 2022-01-24 ENCOUNTER — Other Ambulatory Visit: Payer: Self-pay | Admitting: Family Medicine

## 2022-01-25 NOTE — Progress Notes (Signed)
PCP: Claretta Fraise, MD Nephrology: Dr. Joseph Berkshire Cardiology: Dr. Domenic Polite HF Cardiology: Dr. Aundra Dubin  76 y.o. with history of suspected CAD/NSTEMI, CKD stage 3 s/p renal transplant, chronic systolic CHF, and persistent atrial fibrillation was referred by Dr. Domenic Polite for evaluation of CHF, atrial fibrillation, and suspected CAD.  Patient was doing fairly well until 1/22.  He developed COVID-19 PNA.  Hospitalization was complicated by NSTEMI and the onset of atrial fibrillation.  Cath was not done with elevated creatinine.  Echo in 1/22 showed EF 40-45% with wall motion abnormalities.  Cardiolite done as an outpatient in 3/22 showed EF 21%, inferior and inferolateral fixed defect.    Patient had CVA in 2/22, likely related to atrial fibrillation.  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  He did PT.   Repeat echo in 4/22 showed EF down further to 25-30%.  RHC/LHC was done in 8/22.  This showed severe 3VD with occluded RCA with collaterals, 99% proximal-mid LAD stenosis, 80% ostial LCx, significant disease in OMs and diagonals.  RHC showed normal filling pressures and preserved cardiac output.   He saw Dr. Cyndia Bent and decided that he did not want to undergo CABG.  Dr. Aundra Dubin reviewed the cath films with Dr. Burt Knack. Could potentially intervene on LAD, but would essentially be a CTO procedure with increased risk to the patient (including worsening renal function).  Based on recently released REVIVED-BCIS trial, do not think that Mr. Vanhorn would derive much benefit from PCI in this situation in the absence of significant angina (which he does not seem to have).   In 9/22, he had DCCV back to NSR.  HR in 40s at rest.   Follow up 1/23, he stopped Entresto due to light headedness. Unable to start low dose ARB due to elevated renal function. Nephrology stopped spironolactone 1/23. PCP started lisinopril 3/23.  Follow up 9/23, spiro restarted but later stopped with hyperkalemia and increase SCr. Echo  (9/23): EF 30-35%, grade II DD, RV mildly reduced.  Seen in ED 10/29/21 with a fall, no LOC. Rib fx 6-7.   Admitted 10/23 with NSTEMI. He was clear he did not want any invasive procedures, only medical management. PMT consulted and he was made DNR/DNI. Echo showed EF declined to 20-25%, grade III DD, RV mildly reduced. Cardiology and Nephrology followed during admission. He was diuresed with IV lasix, transitioned to lasix 20 mg qod. Hospitalization complicated by AKI on CKD and +UTI. Urine culture + klebsiella penumoniae and Farxiga stopped. He remained on Bactrim but this was for PJP ppx. Arlyce Harman and ACEi stopped, but Wilder Glade resumed at discharge. Discharged home, weight, 212 lbs.   Today he returns for post hospital HF follow up, with his daughter. Overall feeling fatigued. Saw PCP last week and had more swelling, spiro 25 restarted. Has not been urinating as briskly. He does OK with ADLs, but SOB walking short distances on flat ground with his walker around the house. Continues with abdominal tightness and worse LE swelling. Denies palpitations, abnormal bleeding, CP, dizziness,  or PND/Orthopnea. Appetite fair. No fever or chills. Weight at home 220 pounds. Taking all medications. Doing PT 2x/week and has [place] RN.  REDs: 37%  ECG (personally reviewed): sinus bradycardia, old inferior MI.  Labs (6/22): LDL 92, HDL 40, K 5.1, creatinine 1.7 Labs (8/22): K 5.4, creatinine 2.16, LFTs normal, TSH normal, plts 123, hgb 15.2 Labs (9/22): K 4.7, creatinine 2.11 Labs (11/22): K 4.9, creatinine 2.34, LDL 52, TGs 128, BNP 573  Labs (3/23): K 4.7, creatinine 2.34, LDL 54, HDL 34 Labs (5/23): K 4.2, creatinine 1.83 Labs (6/23): K 4.3, creatinine 1.7, LDL 50 Labs (10/23): K 4.7, creatinine 2.90, hgb 8  PMH: 1. Type 2 diabetes 2. HTN 3. Hyperlipidemia 4. H/o COVID-19 PNA in 1/22.  5. CKD: Stage 3.  History of renal transplant.   6. 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  7.  Thrombocytopenia 8. Skin cancer 9. OSA 10. CAD: NSTEMI in 1/22 during admission for COVID PNA.  HS-TnI to 8647.  Not cathed with elevated creatinine.  - Cardiolite (3/22): EF 21%, inferior and inferolateral fixed defect, apical fixed defect.  - LHC (8/22): Severe 3VD with occluded RCA with collaterals, 99% proximal-mid LAD stenosis, 80% ostial LCx, significant disease in OMs and diagonals. 11. Chronic systolic CHF: Ischemic cardiomyopathy.  - Echo (1/22): EF 40-45%, wall motion abnormalities.  - Echo (4/22): EF 25-30%, global hypokinesis.  - RHC/LHC (8/22): severe 3VD with occluded RCA with collaterals, 99% proximal-mid LAD stenosis, 80% ostial LCx, significant disease in OMs and diagonals. Mean RA 2, PA 21/7, mean PCWP 11, CI 2.2 Fick/2.33 thermodilution - Echo (9/23): EF 30-35%, grade II DD, RV mildly reduced. - Echo (10/23): EF 20-25%, grade III DD, RV mildly reduced. 12. Atrial fibrillation: Persistent since 1/22. DCCV to NSR in 9/22.   Social History   Socioeconomic History   Marital status: Widowed    Spouse name: Not on file   Number of children: 3   Years of education: Not on file   Highest education level: Not on file  Occupational History   Not on file  Tobacco Use   Smoking status: Some Days    Types: Pipe   Smokeless tobacco: Never  Vaping Use   Vaping Use: Never used  Substance and Sexual Activity   Alcohol use: Never   Drug use: Never   Sexual activity: Not Currently  Other Topics Concern   Not on file  Social History Narrative   Wife passed away in 04/27/2007.   2 daughters, 1 son. All live close by.    3 grandchildren.    Social Determinants of Health   Financial Resource Strain: Low Risk  (01/12/2022)   Overall Financial Resource Strain (CARDIA)    Difficulty of Paying Living Expenses: Not hard at all  Food Insecurity: No Food Insecurity (01/12/2022)   Hunger Vital Sign    Worried About Running Out of Food in the Last Year: Never true    Ran Out of Food in  the Last Year: Never true  Transportation Needs: No Transportation Needs (01/12/2022)   PRAPARE - Hydrologist (Medical): No    Lack of Transportation (Non-Medical): No  Physical Activity: Insufficiently Active (04/15/2021)   Exercise Vital Sign    Days of Exercise per Week: 7 days    Minutes of Exercise per Session: 20 min  Stress: No Stress Concern Present (01/12/2022)   [place]    Feeling of Stress : Not at all  Social Connections: Moderately Integrated (01/12/2022)   Social Connection and Isolation Panel [NHANES]    Frequency of Communication with Friends and Family: More than three times a week    Frequency of Social Gatherings with Friends and Family: More than three times a week    Attends Religious Services: 1 to 4 times per year    Active Member of [employer] or Organizations: No    Attends [employer]  or Organization Meetings: 1 to 4 times per year    Marital Status: Widowed  Intimate Partner Violence: Not At Risk (01/12/2022)   Humiliation, Afraid, Rape, and Kick questionnaire    Fear of Current or Ex-Partner: No    Emotionally Abused: No    Physically Abused: No    Sexually Abused: No   Family History  Problem Relation Age of Onset   Clotting disorder Mother    Hypertension Sister    Diabetes Sister    ROS: All systems reviewed and negative except as per HPI.   Current Outpatient Medications  Medication Sig Dispense Refill   acetaminophen (TYLENOL) 500 MG tablet Take 500 mg by mouth every 8 (eight) hours as needed for moderate pain.     albuterol (VENTOLIN HFA) 108 (90 Base) MCG/ACT inhaler Inhale 2 puffs into the lungs every 6 (six) hours as needed for wheezing or shortness of breath. 1 each 3   ALPRAZolam (XANAX) 0.25 MG tablet Take 1 tablet (0.25 mg total) by mouth at bedtime as needed for anxiety. 90 tablet 1   amiodarone (PACERONE) 200 MG tablet Take 0.5 tablets (100 mg total) by  mouth daily. 45 tablet 3   amoxicillin-clavulanate (AUGMENTIN) 875-125 MG tablet Take 1 tablet by mouth 2 (two) times daily. Take all of this medication 20 tablet 0   atorvastatin (LIPITOR) 40 MG tablet Take 1 tablet (40 mg total) by mouth daily. 90 tablet 3   doxycycline (VIBRAMYCIN) 100 MG capsule Take 1 capsule (100 mg total) by mouth 2 (two) times daily. 20 capsule 0   ELIQUIS 5 MG TABS tablet Take 1 tablet by mouth twice daily 60 tablet 11   ezetimibe (ZETIA) 10 MG tablet Take 1 tablet (10 mg total) by mouth daily. 90 tablet 3   fluticasone (FLONASE) 50 MCG/ACT nasal spray Place 1 spray into both nostrils daily as needed for allergies.     insulin NPH Human (NOVOLIN N) 100 UNIT/ML injection 10 units AC breakfast and 10 units AC supper 30 mL 11   insulin regular (NOVOLIN R) 100 units/mL injection 10 units w bkfst and 6 units w dinner 10 mL 5   levofloxacin (LEVAQUIN) 500 MG tablet Take 1 tablet (500 mg total) by mouth daily. 5 tablet 0   loratadine (CLARITIN) 10 MG tablet Take 10 mg by mouth daily as needed for allergies.     metoprolol succinate (TOPROL-XL) 25 MG 24 hr tablet Take 1 tablet (25 mg total) by mouth daily. Take with or immediately following a meal. 30 tablet 2   mycophenolate (CELLCEPT) 250 MG capsule Take 500 mg by mouth 2 (two) times daily.     nitroGLYCERIN (NITROSTAT) 0.4 MG SL tablet Place 1 tablet (0.4 mg total) under the tongue every 5 (five) minutes as needed for chest pain. 25 tablet 3   ondansetron (ZOFRAN) 4 MG tablet TAKE 1 TABLET BY MOUTH EVERY 8 HOURS AS NEEDED FOR NAUSEA FOR VOMITING 20 tablet 2   predniSONE (DELTASONE) 5 MG tablet Take 5 mg by mouth daily with breakfast.     tacrolimus (PROGRAF) 0.5 MG capsule Take 1 mg by mouth in the morning and at bedtime.     torsemide (DEMADEX) 20 MG tablet Take 2 tablets (40 mg total) by mouth daily. 60 tablet 0   Vericiguat (VERQUVO) 2.5 MG TABS Take 2.5 mg by mouth daily. 30 tablet 6   No current facility-administered  medications for this visit.   Wt Readings from Last 3 Encounters:  01/18/22 88  kg (194 lb)  01/12/22 88.1 kg (194 lb 3.2 oz)  01/05/22 89.4 kg (197 lb 3.2 oz)   There were no vitals taken for this visit. Physical Exam General:  NAD. No resp difficulty, arrived in [facility], pale HEENT: Normal Neck: Supple. JVP to ear. Carotids 2+ bilat; no bruits. No lymphadenopathy or thryomegaly appreciated. Cor: PMI nondisplaced. Regular rate & rhythm. No rubs, gallops or murmurs. Lungs: Clear Abdomen: +distendeded, nontender.No hepatosplenomegaly. No bruits or masses. Good bowel sounds. Extremities: No cyanosis, clubbing, rash, 3+ BLE edema to knees Neuro: Alert & oriented x 3, cranial nerves grossly intact. Moves all 4 extremities w/o difficulty. Affect pleasant.  Assessment/Plan: 1. Acute on Chronic systolic CHF: Echo in 1/01 with EF 25-30%.   Patient had NSTEMI during 1/22 admission with COVID-19, not cathed with CKD.  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 RHC in 8/22 with preserved cardiac output and low filling pressures.  Echo 10/23 during most recent admission for NSTEMI showed EF worse 20-25%, RV mildly decreased. NYHA class III, though seems mostly limited by fatigue. He is markedly volume overloaded on exam, weight up 16 lbs since discharge. ReDs only mildly elevated at 37%. GDMT limited  by CKD and previous orthostasis. - With worsening renal function, stop spiro. - Give Furoscix 80 + 40 KCL daily x 2 days (hold po Lasix while taking Furoscix). BMET, BNP and Mag today. - After he has completed Furoscix, restart Lasix 40 mg bid + 20 KCL daily. - Off lisinopril w/ AKI on CKD. Remain off for now, will plan to add back if able. - Continue Toprol XL 100 mg daily.  - Continue Farxiga 10 mg daily. No GU symptoms, follow closely with recent UTI. - Continue vericiguat 2.5 mg daily, did not tolerate 5 mg daily.  - He says that he would not be interested in ICD.  2. CAD: Cath in 8/22 with severe 3VD,  occluded RCA with collaterals, 99% proximal-mid LAD stenosis, 80% ostial LCx, significant disease in OMs and diagonals.  With low EF and 3VD, I recommended CABG.  He saw Dr. Cyndia Bent, and is adamant that he does not want to have surgery.  Dr. Cyndia Bent wanted him to get an MRI for viability, but if he is not going to have CABG, would not get MRI.  I reviewed his cath films with Dr. Burt Knack for PCI options.  Could potentially intervene on LAD, but would essentially be a CTO procedure with increased risk to the patient (including worsening renal function).  Based on recent REVIVED-BCIS trial, I do not think that Mr. Haughey would derive much benefit from PCI in this situation in the absence of significant angina (which he does not have currently).  Recent admit 10/23 with NSTEMI. He did not want any invasive interventions. Echo showed EF mildly lower than previous, 25-30%. No chest pain. ECG with old inferior MI, otherwise no acute changes. - Continue atorvastatin, good lipids in 3/23.  - He is not on ASA due to Eliquis use.  3. Atrial fibrillation: DCCV to NSR in 9/22. NSR today.  - Cotninue amiodarone 100 mg daily.  LFTs and TSH ok 9/23, he will need regular eye exam while on amiodarone.  - Continue Eliquis 5 mg bid.  Recent hgb 8.0. PCP managing anemia. Will need to watch. - We have discussed AF ablation, but he is not interested in invasive procedures.   4. 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  5. CKD: stage 3.  Post-transplant.  Followed by nephrology, Dr. Joseph Berkshire. -  Recent AKI with SCr up to 2.9. BMET today. - Stop spiro as above.  - Remain off ACEi for now. 6. HTN: BP not elevated. Stop spironolactone as above. 7. Chronic Anemia: Likely related to CKD. He is on Eliquis for AF. No bleeding issues. - Recent hgb 8.0. He is on oral iron suppl. - May need Procrit/ESA 8. Physical Deconditioning: Continue PT. Daughter lives across the street and helps out. 9. GOC: DNR/DNI. Palliative services arranged at discharge. May  be more appropriate for hospice.  Follow up next week with APP (post Furoscix, will need BMET and Mag). He is high risk for readmit with cardiorenal syndrome and physical deconditioning.  [place] , FNP-BC 01/25/2022

## 2022-01-25 NOTE — Telephone Encounter (Signed)
Stacks addressed and sent abx

## 2022-01-26 ENCOUNTER — Ambulatory Visit (HOSPITAL_COMMUNITY)
Admission: RE | Admit: 2022-01-26 | Discharge: 2022-01-26 | Disposition: A | Discharge status: Home / Self Care | Payer: Medicare Other | Source: Ambulatory Visit | Attending: Family Medicine | Admitting: Family Medicine

## 2022-01-26 ENCOUNTER — Ambulatory Visit: Payer: Self-pay | Admitting: *Deleted

## 2022-01-26 ENCOUNTER — Encounter (HOSPITAL_COMMUNITY): Payer: Self-pay

## 2022-01-26 VITALS — BP 116/52 | HR 67 | Wt 195.6 lb

## 2022-01-26 DIAGNOSIS — Z79899 Other long term (current) drug therapy: Secondary | ICD-10-CM | POA: Insufficient documentation

## 2022-01-26 DIAGNOSIS — N1832 Chronic kidney disease, stage 3b: Secondary | ICD-10-CM | POA: Diagnosis not present

## 2022-01-26 DIAGNOSIS — E875 Hyperkalemia: Secondary | ICD-10-CM | POA: Diagnosis not present

## 2022-01-26 DIAGNOSIS — I252 Old myocardial infarction: Secondary | ICD-10-CM | POA: Insufficient documentation

## 2022-01-26 DIAGNOSIS — T8619 Other complication of kidney transplant: Secondary | ICD-10-CM | POA: Diagnosis not present

## 2022-01-26 DIAGNOSIS — I4891 Unspecified atrial fibrillation: Secondary | ICD-10-CM

## 2022-01-26 DIAGNOSIS — D631 Anemia in chronic kidney disease: Secondary | ICD-10-CM

## 2022-01-26 DIAGNOSIS — Z66 Do not resuscitate: Secondary | ICD-10-CM | POA: Diagnosis not present

## 2022-01-26 DIAGNOSIS — I5022 Chronic systolic (congestive) heart failure: Secondary | ICD-10-CM | POA: Diagnosis not present

## 2022-01-26 DIAGNOSIS — Z8673 Personal history of transient ischemic attack (TIA), and cerebral infarction without residual deficits: Secondary | ICD-10-CM | POA: Insufficient documentation

## 2022-01-26 DIAGNOSIS — I2510 Atherosclerotic heart disease of native coronary artery without angina pectoris: Secondary | ICD-10-CM | POA: Insufficient documentation

## 2022-01-26 DIAGNOSIS — N390 Urinary tract infection, site not specified: Secondary | ICD-10-CM | POA: Insufficient documentation

## 2022-01-26 DIAGNOSIS — I4819 Other persistent atrial fibrillation: Secondary | ICD-10-CM | POA: Insufficient documentation

## 2022-01-26 DIAGNOSIS — E1122 Type 2 diabetes mellitus with diabetic chronic kidney disease: Secondary | ICD-10-CM | POA: Insufficient documentation

## 2022-01-26 DIAGNOSIS — N179 Acute kidney failure, unspecified: Secondary | ICD-10-CM | POA: Insufficient documentation

## 2022-01-26 DIAGNOSIS — Z7901 Long term (current) use of anticoagulants: Secondary | ICD-10-CM | POA: Diagnosis not present

## 2022-01-26 DIAGNOSIS — N183 Chronic kidney disease, stage 3 unspecified: Secondary | ICD-10-CM

## 2022-01-26 DIAGNOSIS — D72819 Decreased white blood cell count, unspecified: Secondary | ICD-10-CM

## 2022-01-26 DIAGNOSIS — I255 Ischemic cardiomyopathy: Secondary | ICD-10-CM | POA: Insufficient documentation

## 2022-01-26 DIAGNOSIS — Z7189 Other specified counseling: Secondary | ICD-10-CM

## 2022-01-26 DIAGNOSIS — Y830 Surgical operation with transplant of whole organ as the cause of abnormal reaction of the patient, or of later complication, without mention of misadventure at the time of the procedure: Secondary | ICD-10-CM | POA: Insufficient documentation

## 2022-01-26 DIAGNOSIS — Z862 Personal history of diseases of the blood and blood-forming organs and certain disorders involving the immune mechanism: Secondary | ICD-10-CM

## 2022-01-26 DIAGNOSIS — I130 Hypertensive heart and chronic kidney disease with heart failure and stage 1 through stage 4 chronic kidney disease, or unspecified chronic kidney disease: Secondary | ICD-10-CM | POA: Diagnosis not present

## 2022-01-26 MED ORDER — TORSEMIDE 20 MG PO TABS: 40.0000 mg | ORAL_TABLET | Freq: Every day | ORAL | 3 refills | Status: AC

## 2022-01-26 NOTE — Patient Instructions (Signed)
No change in medications. Return to [facility] in 4 weeks. Return to see Dr. Aundra Dubin in [facility] in 3 months.

## 2022-01-26 NOTE — Patient Outreach (Signed)
  Care Coordination   Follow Up Visit Note   01/28/2022 Name: Daniel Myers MRN: [id number] DOB: 12/03/1945  Daniel Myers is a 76 y.o. year old male who sees Stacks, [name], MD for primary care. I spoke with  Daniel Myers by phone today.  What matters to the patients health and wellness today?  UTI finished Levaquin had pain and bloody urine last week but now better Aware that UTIs may be from patient holding urine & he had a [place] at hospital that had not been changed per standard,. Will monitoring for all symptoms of UTI and consider request for standing order for UTI form pcp. Daniel Myers will pick up the other antibiotic from the pharmacy to use if he develops other UTI symptoms in next week.   congestive Heart Failure (CHF)  Gets 2 litters of fluids daily, decreased swelling    Goals Addressed               This Visit's Progress     Patient Stated     manage Congestive heart failure ([facility]) (pt-stated)   On track     Care Coordination Interventions: Basic overview and discussion of pathophysiology of Heart Failure reviewed Discussed importance of daily weight and advised patient to weigh and record daily Follow up to assess for any needs since last outreach Encouraged to outreach as needed after MD appointment Confirmed patient is overall doing better per daughter, Daniel Myers       Treat UTI and Avoid ED Visit (pt-stated)   On track     Care Coordination Interventions: Assessment of understanding of urinary tract infection diagnosis Education: Basic overview and discussion of urinary tract infections with patient Medications reviewed including antibiotic therapy Discussed a possible standing antibiotic order for recurrent UTIs . They want to avoid the ED. Discussed fluid restriction of 2L per day.  Discussed potential worsening of symptoms to look out for .  Confirmed he has improve after doses of Levaquin Daughter expressed understanding and will follow-up as needed Will follow  up 01/29/22        SDOH assessments and interventions completed:  No     Care Coordination Interventions:  Yes, provided   Follow up plan: Follow up call scheduled for 01/30/23    Encounter Outcome:  Pt. Visit Completed   [name] Lavina Hamman, RN, BSN, [place] Coordinator Office number [phone]

## 2022-01-27 ENCOUNTER — Encounter: Payer: Self-pay | Admitting: Family Medicine

## 2022-01-27 DIAGNOSIS — N390 Urinary tract infection, site not specified: Secondary | ICD-10-CM

## 2022-01-27 MED ORDER — LEVOFLOXACIN 500 MG PO TABS: 500.0000 mg | ORAL_TABLET | Freq: Every day | ORAL | 0 refills | Status: DC

## 2022-01-27 NOTE — Telephone Encounter (Signed)
This happens sometimes. I renewed the antibiotic for another week.

## 2022-01-28 NOTE — Patient Instructions (Signed)
Visit Information  Thank you for taking time to visit with me today. Please don't hesitate to contact me if I can be of assistance to you.   Following are the goals we discussed today:   Goals Addressed               This Visit's Progress     Patient Stated     manage Congestive heart failure (THN) (pt-stated)   On track     Care Coordination Interventions: Basic overview and discussion of pathophysiology of Heart Failure reviewed Discussed importance of daily weight and advised patient to weigh and record daily Follow up to assess for any needs since last outreach Encouraged to outreach as needed after MD appointment Confirmed patient is overall doing better per daughter, Cecille Rubin       Treat UTI and Avoid ED Visit (pt-stated)   On track     Care Coordination Interventions: Assessment of understanding of urinary tract infection diagnosis Education: Basic overview and discussion of urinary tract infections with patient Medications reviewed including antibiotic therapy Discussed a possible standing antibiotic order for recurrent UTIs . They want to avoid the ED. Discussed fluid restriction of 2L per day.  Discussed potential worsening of symptoms to look out for .  Confirmed he has improve after doses of Levaquin Daughter expressed understanding and will follow-up as needed Will follow up 01/29/22        Our next appointment is by telephone on 01/29/22 at 0900  Please call the care guide team at [phone] if you need to cancel or reschedule your appointment.   If you are experiencing a Mental Health or [place] or need someone to talk to, please call the Suicide and Crisis Lifeline: 988 call the [place] National Suicide Prevention Lifeline: 561 721 4282 or TTY: 3233016522 TTY 2180492306) to talk to a trained counselor call 1-800-273-TALK (toll free, 24 hour hotline) call the [facility]: (440) 288-6334 call 911   Patient verbalizes  understanding of instructions and care plan provided today and agrees to view in [place]. Active MyChart status and patient understanding of how to access instructions and care plan via MyChart confirmed with patient.     The patient has been provided with contact information for the care management team and has been advised to call with any health related questions or concerns.    Edin Kon L. Lavina Hamman, RN, BSN, [place] Coordinator Office number [phone]

## 2022-01-29 ENCOUNTER — Ambulatory Visit: Payer: Self-pay | Admitting: *Deleted

## 2022-01-29 NOTE — Patient Outreach (Signed)
  Care Coordination   Follow Up Visit Note   01/29/2022 Name: Daniel Myers MRN: [id number] DOB: November 22, 1945  Daniel Myers is a 76 y.o. year old male who sees Stacks, Daniel Gash, MD for primary care. I spoke with Daniel Myers daughter of  Daniel Myers by phone today.  What matters to the patients health and wellness today?  Follow up outreach He continues to be doing well  Daniel Myers reports he has "perked up" Request [facility]N 24 hour nurse call number be text to her    Goals Addressed               This Visit's Progress     Patient Stated     Treat UTI and Avoid ED Visit (pt-stated)   On track     Care Coordination Interventions: Assessment of understanding of urinary tract infection diagnosis Education: Basic overview and discussion of urinary tract infections with patient Medications reviewed including antibiotic therapy Follow up with Daniel Myers daughter related to recurrent UTIs .  Confirmed Daniel Myers continues to "perk up" and is doing well Provided the [facility] 24 hr nurse call number to Daniel Myers via text         SDOH assessments and interventions completed:  No     Care Coordination Interventions:  Yes, provided   Follow up plan: Follow up call scheduled for 03/01/22 1000    Encounter Outcome:  Pt. Visit Completed   Kelii Chittum L. Lavina Hamman, RN, BSN, [place] Coordinator Office number [phone]

## 2022-01-29 NOTE — Patient Instructions (Signed)
Visit Information  Thank you for taking time to visit with me today. Please don't hesitate to contact me if I can be of assistance to you.    [facility]N 24 hours nurse call center number is [phone]  Following are the goals we discussed today:   Goals Addressed               This Visit's Progress     Patient Stated     Treat UTI and Avoid ED Visit (pt-stated)   On track     Care Coordination Interventions: Assessment of understanding of urinary tract infection diagnosis Education: Basic overview and discussion of urinary tract infections with patient Medications reviewed including antibiotic therapy Follow up with Cecille Rubin daughter related to recurrent UTIs .  Confirmed Mr Bouwman continues to "perk up" and is doing well Provided the [facility] 24 hr nurse call number to [facility] via text         Our next appointment is by telephone on 03/01/22 at 1000  Please call the care guide team at [phone] if you need to cancel or reschedule your appointment.   If you are experiencing a Mental Health or [place] or need someone to talk to, please call the Suicide and Crisis Lifeline: 988   Patient verbalizes understanding of instructions and care plan provided today and agrees to view in [place]. Active MyChart status and patient understanding of how to access instructions and care plan via MyChart confirmed with patient.     The patient has been provided with contact information for the care management team and has been advised to call with any health related questions or concerns.    Demitri Kucinski L. Lavina Hamman, RN, BSN, [place] Coordinator Office number [phone]

## 2022-02-09 ENCOUNTER — Ambulatory Visit (INDEPENDENT_AMBULATORY_CARE_PROVIDER_SITE_OTHER): Payer: Medicare Other

## 2022-02-09 DIAGNOSIS — I252 Old myocardial infarction: Secondary | ICD-10-CM

## 2022-02-09 DIAGNOSIS — N184 Chronic kidney disease, stage 4 (severe): Secondary | ICD-10-CM | POA: Diagnosis not present

## 2022-02-09 DIAGNOSIS — I2510 Atherosclerotic heart disease of native coronary artery without angina pectoris: Secondary | ICD-10-CM

## 2022-02-09 DIAGNOSIS — E1140 Type 2 diabetes mellitus with diabetic neuropathy, unspecified: Secondary | ICD-10-CM

## 2022-02-09 DIAGNOSIS — E1122 Type 2 diabetes mellitus with diabetic chronic kidney disease: Secondary | ICD-10-CM

## 2022-02-09 DIAGNOSIS — I429 Cardiomyopathy, unspecified: Secondary | ICD-10-CM

## 2022-02-09 DIAGNOSIS — F419 Anxiety disorder, unspecified: Secondary | ICD-10-CM

## 2022-02-09 DIAGNOSIS — M1712 Unilateral primary osteoarthritis, left knee: Secondary | ICD-10-CM

## 2022-02-09 DIAGNOSIS — I4819 Other persistent atrial fibrillation: Secondary | ICD-10-CM

## 2022-02-09 DIAGNOSIS — M19012 Primary osteoarthritis, left shoulder: Secondary | ICD-10-CM

## 2022-02-09 DIAGNOSIS — I5043 Acute on chronic combined systolic (congestive) and diastolic (congestive) heart failure: Secondary | ICD-10-CM | POA: Diagnosis not present

## 2022-02-09 DIAGNOSIS — E782 Mixed hyperlipidemia: Secondary | ICD-10-CM

## 2022-02-09 DIAGNOSIS — F17290 Nicotine dependence, other tobacco product, uncomplicated: Secondary | ICD-10-CM

## 2022-02-09 DIAGNOSIS — M160 Bilateral primary osteoarthritis of hip: Secondary | ICD-10-CM

## 2022-02-09 DIAGNOSIS — I130 Hypertensive heart and chronic kidney disease with heart failure and stage 1 through stage 4 chronic kidney disease, or unspecified chronic kidney disease: Secondary | ICD-10-CM | POA: Diagnosis not present

## 2022-02-09 DIAGNOSIS — S2242XD Multiple fractures of ribs, left side, subsequent encounter for fracture with routine healing: Secondary | ICD-10-CM

## 2022-02-09 DIAGNOSIS — D696 Thrombocytopenia, unspecified: Secondary | ICD-10-CM

## 2022-02-09 DIAGNOSIS — D631 Anemia in chronic kidney disease: Secondary | ICD-10-CM

## 2022-02-10 ENCOUNTER — Encounter: Payer: Self-pay | Admitting: Family Medicine

## 2022-02-10 ENCOUNTER — Ambulatory Visit (INDEPENDENT_AMBULATORY_CARE_PROVIDER_SITE_OTHER): Payer: Medicare Other | Admitting: Family Medicine

## 2022-02-10 VITALS — BP 117/60 | HR 66 | Temp 97.7°F | Ht 70.0 in | Wt 194.0 lb

## 2022-02-10 DIAGNOSIS — R310 Gross hematuria: Secondary | ICD-10-CM

## 2022-02-10 DIAGNOSIS — E1121 Type 2 diabetes mellitus with diabetic nephropathy: Secondary | ICD-10-CM | POA: Diagnosis not present

## 2022-02-10 DIAGNOSIS — Z794 Long term (current) use of insulin: Secondary | ICD-10-CM

## 2022-02-10 LAB — URINALYSIS
Bilirubin, UA: NEGATIVE
Glucose, UA: NEGATIVE
Ketones, UA: NEGATIVE
Nitrite, UA: NEGATIVE
Specific Gravity, UA: 1.015 (ref 1.005–1.030)
Urobilinogen, Ur: 0.2 mg/dL (ref 0.2–1.0)
pH, UA: 6 (ref 5.0–7.5)

## 2022-02-10 NOTE — Progress Notes (Signed)
Subjective:  Patient ID: Daniel Myers, male    DOB: 08-03-1945  Age: 77 y.o. MRN: [id number]  CC: Medical Management of Chronic Issues   HPI Daniel Myers presents for follow up of hematuria from 12/11. Hasn't noted it since shortly after he was here.      02/10/2022   11:14 AM 01/18/2022   12:42 PM 01/12/2022   11:50 AM  Depression screen PHQ 2/9  Decreased Interest 0 0 0  Down, Depressed, Hopeless 0 0 0  PHQ - 2 Score 0 0 0    History Daniel Myers has a past medical history of Basal cell carcinoma (06/27/2013), Basal cell carcinoma (07/01/2014), Basal cell carcinoma (10/10/2014), Basal cell carcinoma (02/11/2015), Basal cell carcinoma (06/14/2016), Basal cell carcinoma (02/22/2017), Basal cell carcinoma (04/11/2018), Chronic kidney disease, History of renal transplant, Hyperlipidemia, Hypertension, NSTEMI (non-ST elevated myocardial infarction) ([place]) (02/13/2020), 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), 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), 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), 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), 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), 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), 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), Squamous cell carcinoma of skin (11/08/2011), Squamous cell carcinoma of skin (11/08/2011), Squamous cell carcinoma of skin (11/08/2011), 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), 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), 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), 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), 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), 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), 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), 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), Squamous cell carcinoma of skin (09/30/2015), Squamous cell carcinoma of skin (02/22/2017), Squamous cell carcinoma of skin (02/22/2017), Squamous cell carcinoma of skin (04/11/2018), Squamous cell  carcinoma of skin (04/11/2018), Squamous cell carcinoma of skin (04/11/2018), Squamous cell carcinoma of skin (04/11/2018), Squamous cell carcinoma of skin (04/11/2018), Squamous cell carcinoma of skin (09/28/2018), Squamous cell carcinoma of skin (09/28/2018), Squamous cell carcinoma of skin (09/28/2018), Squamous cell carcinoma of skin (03/21/2019), Squamous cell carcinoma of skin (03/21/2019), 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   He has a past surgical history that includes Kidney transplant (Right); RIGHT/LEFT HEART CATH AND CORONARY ANGIOGRAPHY (N/A, 09/09/2020); and Cardioversion (N/A, 10/16/2020).   His family history includes Clotting disorder in his mother; Diabetes in his sister; Hypertension in his sister.He reports that he has been smoking pipe. He has never used smokeless tobacco. He reports that he does not drink alcohol and does not use drugs.    ROS Review of Systems  Constitutional:  Negative for fever.  Respiratory:  Negative for shortness of breath.   Cardiovascular:  Negative for chest pain.  Musculoskeletal:  Negative for arthralgias.  Skin:  Negative for rash.    Objective:  BP 117/60   Pulse 66   Temp 97.7 F (36.5 C)   Ht '5\' 10"'$  (1.778 m)   Wt 194 lb (88 kg)   SpO2 97%   BMI 27.84 kg/m   BP Readings from Last 3 Encounters:  02/10/22 117/60  01/26/22 (!) 116/52  01/18/22 113/62    Wt Readings from Last 3 Encounters:  02/10/22 194 lb (88 kg)  01/26/22 195 lb 9.6 oz (88.7 kg)  01/18/22 194 lb (88 kg)     Physical Exam Vitals reviewed.  Constitutional:      Appearance: He is well-developed.  HENT:     Head: Normocephalic and atraumatic.     Right Ear: External ear normal.  Left Ear: External ear normal.     Mouth/Throat:     Pharynx: No oropharyngeal exudate or posterior oropharyngeal erythema.  Eyes:     Pupils: Pupils are equal, round, and  reactive to light.  Cardiovascular:     Rate and Rhythm: Normal rate and regular rhythm.     Heart sounds: No murmur heard. Pulmonary:     Effort: No respiratory distress.     Breath sounds: Normal breath sounds.  Musculoskeletal:     Cervical back: Normal range of motion and neck supple.  Neurological:     Mental Status: He is alert and oriented to person, place, and time.     Results for orders placed or performed in visit on 02/10/22  Urine Culture   Specimen: Urine   UR  Result Value Ref Range   Urine Culture, Routine Final report (A)    Organism ID, Bacteria Klebsiella pneumoniae (A)    Antimicrobial Susceptibility Comment   CBC with Differential/Platelet  Result Value Ref Range   WBC 4.0 3.4 - 10.8 x10E3/uL   RBC 3.28 (L) 4.14 - 5.80 x10E6/uL   Hemoglobin 9.0 (L) 13.0 - 17.7 g/dL   Hematocrit 29.2 (L) 37.5 - 51.0 %   MCV 89 79 - 97 fL   MCH 27.4 26.6 - 33.0 pg   MCHC 30.8 (L) 31.5 - 35.7 g/dL   RDW 15.0 11.6 - 15.4 %   Platelets 71 (LL) 150 - 450 x10E3/uL   Neutrophils 76 Not Estab. %   Lymphs 15 Not Estab. %   Monocytes 7 Not Estab. %   Eos 1 Not Estab. %   Basos 1 Not Estab. %   Neutrophils Absolute 3.0 1.4 - 7.0 x10E3/uL   Lymphocytes Absolute 0.6 (L) 0.7 - 3.1 x10E3/uL   Monocytes Absolute 0.3 0.1 - 0.9 x10E3/uL   EOS (ABSOLUTE) 0.0 0.0 - 0.4 x10E3/uL   Basophils Absolute 0.0 0.0 - 0.2 x10E3/uL   Hematology Comments: Note:   CMP14+EGFR  Result Value Ref Range   Glucose 184 (H) 70 - 99 mg/dL   BUN 63 (H) 8 - 27 mg/dL   Creatinine, Ser 2.69 (H) 0.76 - 1.27 mg/dL   eGFR 24 (L) >59 mL/min/1.73   BUN/Creatinine Ratio 23 10 - 24   Sodium 139 134 - 144 mmol/L   Potassium 4.1 3.5 - 5.2 mmol/L   Chloride 103 96 - 106 mmol/L   CO2 21 20 - 29 mmol/L   Calcium 7.9 (L) 8.6 - 10.2 mg/dL   Total Protein 5.1 (L) 6.0 - 8.5 g/dL   Albumin 3.1 (L) 3.8 - 4.8 g/dL   Globulin, Total 2.0 1.5 - 4.5 g/dL   Albumin/Globulin Ratio 1.6 1.2 - 2.2   Bilirubin Total 0.7 0.0 -  1.2 mg/dL   Alkaline Phosphatase 68 44 - 121 IU/L   AST 14 0 - 40 IU/L   ALT 10 0 - 44 IU/L  Urinalysis  Result Value Ref Range   Specific Gravity, UA 1.015 1.005 - 1.030   pH, UA 6.0 5.0 - 7.5   Color, UA Yellow Yellow   Appearance Ur Cloudy (A) Clear   Leukocytes,UA 3+ (A) Negative   Protein,UA 2+ (A) Negative/Trace   Glucose, UA Negative Negative   Ketones, UA Negative Negative   RBC, UA 2+ (A) Negative   Bilirubin, UA Negative Negative   Urobilinogen, Ur 0.2 0.2 - 1.0 mg/dL   Nitrite, UA Negative Negative     Assessment & Plan:   Daniel Myers was  seen today for medical management of chronic issues.  Diagnoses and all orders for this visit:  Diabetic nephropathy associated with type 2 diabetes mellitus ([place]) -     CBC with Differential/Platelet -     CMP14+EGFR -     Urinalysis -     Urine Culture  Gross hematuria -     CBC with Differential/Platelet -     CMP14+EGFR -     Urinalysis -     Urine Culture  Other orders -     amoxicillin-clavulanate (AUGMENTIN) 875-125 MG tablet; Take 1 tablet by mouth 2 (two) times daily. Take all of this medication       I have discontinued Naol L. Crumm's levofloxacin. I am also having him start on amoxicillin-clavulanate. Additionally, I am having him maintain his tacrolimus, loratadine, predniSONE, acetaminophen, nitroGLYCERIN, insulin NPH Human, ezetimibe, amiodarone, Eliquis, atorvastatin, albuterol, Verquvo, fluticasone, metoprolol succinate, ALPRAZolam, insulin regular, ondansetron, mycophenolate, and torsemide.  Allergies as of 02/10/2022       Reactions   Other Rash, Palpitations   Use paper tape. tachycardia   Tape Rash   Use paper tape.    Trazodone And Nefazodone Palpitations   tachycardia   Mirtazapine Other (See Comments)   imbalance   Elemental Sulfur Rash   Sulfa Antibiotics Rash        Medication List        Accurate as of February 10, 2022 11:59 PM. If you have any questions, ask your nurse or doctor.           STOP taking these medications    levofloxacin 500 MG tablet Commonly known as: Levaquin Stopped by: Claretta Fraise, MD       TAKE these medications    acetaminophen 500 MG tablet Commonly known as: TYLENOL Take 500 mg by mouth every 8 (eight) hours as needed for moderate pain.   albuterol 108 (90 Base) MCG/ACT inhaler Commonly known as: VENTOLIN HFA Inhale 2 puffs into the lungs every 6 (six) hours as needed for wheezing or shortness of breath.   ALPRAZolam 0.25 MG tablet Commonly known as: XANAX Take 1 tablet (0.25 mg total) by mouth at bedtime as needed for anxiety.   amiodarone 200 MG tablet Commonly known as: PACERONE Take 0.5 tablets (100 mg total) by mouth daily.   amoxicillin-clavulanate 875-125 MG tablet Commonly known as: AUGMENTIN Take 1 tablet by mouth 2 (two) times daily. Take all of this medication Started by: Claretta Fraise, MD   atorvastatin 40 MG tablet Commonly known as: LIPITOR Take 1 tablet (40 mg total) by mouth daily.   Eliquis 5 MG Tabs tablet Generic drug: apixaban Take 1 tablet by mouth twice daily   ezetimibe 10 MG tablet Commonly known as: Zetia Take 1 tablet (10 mg total) by mouth daily.   fluticasone 50 MCG/ACT nasal spray Commonly known as: FLONASE Place 1 spray into both nostrils daily as needed for allergies.   insulin NPH Human 100 UNIT/ML injection Commonly known as: NOVOLIN N 10 units AC breakfast and 10 units AC supper   insulin regular 100 units/mL injection Commonly known as: NOVOLIN R 10 units w bkfst and 6 units w dinner   loratadine 10 MG tablet Commonly known as: CLARITIN Take 10 mg by mouth daily as needed for allergies.   metoprolol succinate 25 MG 24 hr tablet Commonly known as: TOPROL-XL Take 1 tablet (25 mg total) by mouth daily. Take with or immediately following a meal.   mycophenolate 250 MG capsule Commonly  known as: CELLCEPT Take 250 mg by mouth 2 (two) times daily.   nitroGLYCERIN 0.4  MG SL tablet Commonly known as: NITROSTAT Place 1 tablet (0.4 mg total) under the tongue every 5 (five) minutes as needed for chest pain.   ondansetron 4 MG tablet Commonly known as: ZOFRAN TAKE 1 TABLET BY MOUTH EVERY 8 HOURS AS NEEDED FOR NAUSEA FOR VOMITING   predniSONE 5 MG tablet Commonly known as: DELTASONE Take 5 mg by mouth daily with breakfast.   tacrolimus 0.5 MG capsule Commonly known as: PROGRAF Take 1 mg by mouth in the morning and at bedtime.   torsemide 20 MG tablet Commonly known as: DEMADEX Take 2 tablets (40 mg total) by mouth daily.   Verquvo 2.5 MG Tabs Generic drug: Vericiguat Take 2.5 mg by mouth daily.         Follow-up: Return in about 1 month (around 03/13/2022).  Claretta Fraise, M.D.

## 2022-02-11 LAB — CBC WITH DIFFERENTIAL/PLATELET

## 2022-02-12 LAB — CMP14+EGFR
ALT: 10 IU/L (ref 0–44)
AST: 14 IU/L (ref 0–40)
Albumin/Globulin Ratio: 1.6 (ref 1.2–2.2)
Albumin: 3.1 g/dL — ABNORMAL LOW (ref 3.8–4.8)
Alkaline Phosphatase: 68 IU/L (ref 44–121)
BUN/Creatinine Ratio: 23 (ref 10–24)
BUN: 63 mg/dL — ABNORMAL HIGH (ref 8–27)
Bilirubin Total: 0.7 mg/dL (ref 0.0–1.2)
CO2: 21 mmol/L (ref 20–29)
Calcium: 7.9 mg/dL — ABNORMAL LOW (ref 8.6–10.2)
Chloride: 103 mmol/L (ref 96–106)
Creatinine, Ser: 2.69 mg/dL — ABNORMAL HIGH (ref 0.76–1.27)
Globulin, Total: 2 g/dL (ref 1.5–4.5)
Glucose: 184 mg/dL — ABNORMAL HIGH (ref 70–99)
Potassium: 4.1 mmol/L (ref 3.5–5.2)
Sodium: 139 mmol/L (ref 134–144)
Total Protein: 5.1 g/dL — ABNORMAL LOW (ref 6.0–8.5)
eGFR: 24 mL/min/{1.73_m2} — ABNORMAL LOW (ref >59)

## 2022-02-12 LAB — CBC WITH DIFFERENTIAL/PLATELET
Basophils Absolute: 0 10*3/uL (ref 0.0–0.2)
Basos: 1 %
EOS (ABSOLUTE): 0 10*3/uL (ref 0.0–0.4)
Eos: 1 %
Hematocrit: 29.2 % — ABNORMAL LOW (ref 37.5–51.0)
Hemoglobin: 9 g/dL — ABNORMAL LOW (ref 13.0–17.7)
Lymphocytes Absolute: 0.6 10*3/uL — ABNORMAL LOW (ref 0.7–3.1)
Lymphs: 15 %
MCH: 27.4 pg (ref 26.6–33.0)
MCHC: 30.8 g/dL — ABNORMAL LOW (ref 31.5–35.7)
MCV: 89 fL (ref 79–97)
Monocytes Absolute: 0.3 10*3/uL (ref 0.1–0.9)
Monocytes: 7 %
Neutrophils Absolute: 3 10*3/uL (ref 1.4–7.0)
Neutrophils: 76 %
Platelets: 71 10*3/uL — CL (ref 150–450)
RBC: 3.28 x10E6/uL — ABNORMAL LOW (ref 4.14–5.80)
RDW: 15 % (ref 11.6–15.4)
WBC: 4 10*3/uL (ref 3.4–10.8)

## 2022-02-12 NOTE — Progress Notes (Signed)
Hello Niall,  Your lab result is stable.There are some expected abnormalities that are as expected. Best regards, Claretta Fraise, M.D.

## 2022-02-13 LAB — URINE CULTURE

## 2022-02-13 IMAGING — CT CT ANGIO NECK
1 of 12 series · 5 of 33 positions shown · IV contrast (Omnipaque or Isovue)
Comparison: MRI same day

CLINICAL DATA: Scattered infarctions in the right MCA territory.

EXAM:
CT ANGIOGRAPHY HEAD AND NECK
TECHNIQUE: Multidetector CT imaging of the head and neck was performed using
the standard protocol during bolus administration of intravenous
contrast. Multiplanar CT image reconstructions and MIPs were
obtained to evaluate the vascular anatomy. Carotid stenosis
measurements (when applicable) are obtained utilizing NASCET
criteria, using the distal internal carotid diameter as the
denominator.
CONTRAST:  50mL OMNIPAQUE IOHEXOL 350 MG/ML SOLN

[Series 6: ax thins · axial · 0.39mm/px · z∈[-160,+78]mm · 5 of 357 slices shown]
[im 60/357  soft-tissue]
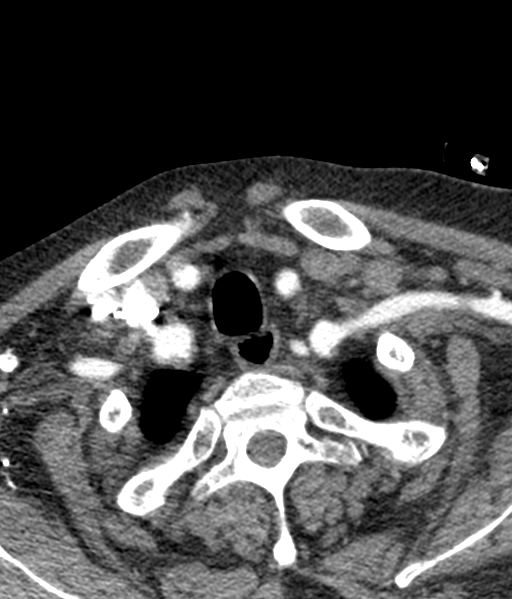
[im 119/357  bone]
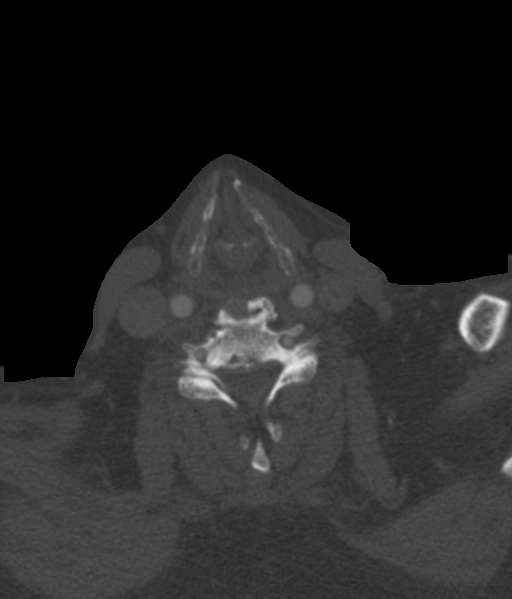
[im 179/357  soft-tissue]
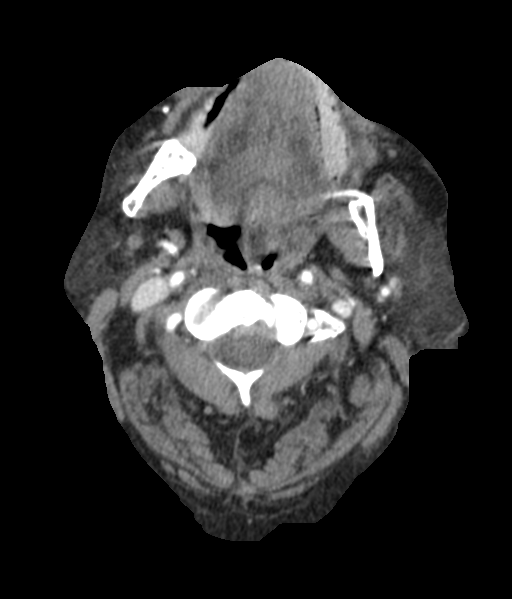
[im 238/357  bone]
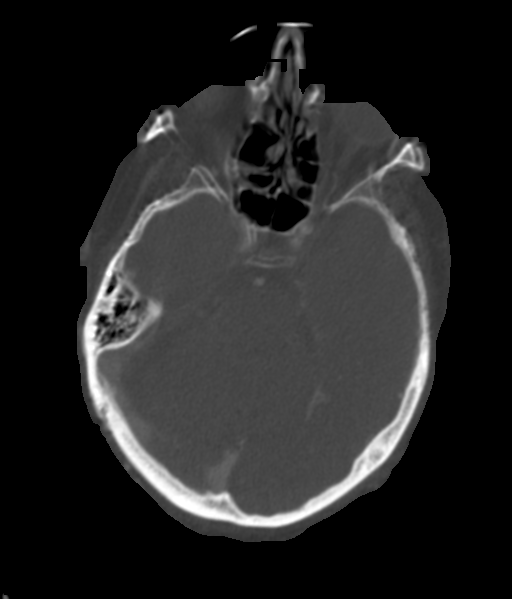
[im 297/357  soft-tissue]
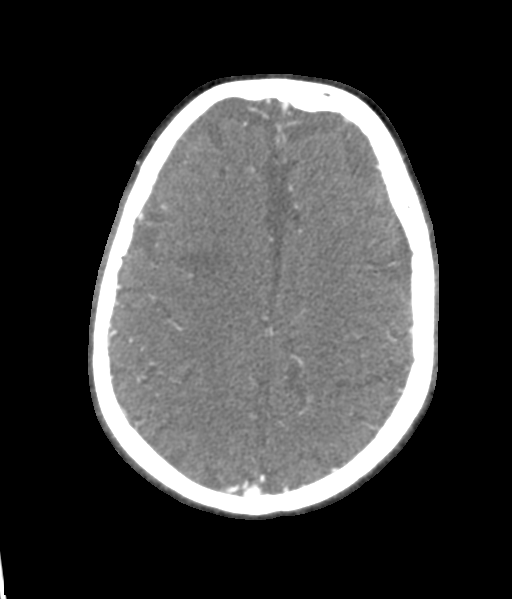

[5 of 33 positions shown; findings below may reference images not displayed]

FINDINGS: CTA NECK FINDINGS

Aortic arch: Aortic arch is normal. Mild atherosclerotic
calcification of the proximal innominate artery.

Right carotid system: Common carotid artery widely patent to the
bifurcation. Soft and calcified plaque at the carotid bifurcation
and ICA bulb. Minimal diameter in the ICA bulb 1 mm or less.
Compared to a more distal cervical ICA expected diameter of 5 mm,
this indicates an 80% or greater stenosis.

Left carotid system: Common carotid artery widely patent to the
bifurcation. Soft and calcified plaque at the carotid bifurcation
and ICA bulb but no stenosis.

Vertebral arteries: Both vertebral artery origins are patent. Both
vertebral arteries appear normal through the cervical region to the
foramen magnum.

Skeleton: Ordinary cervical spondylosis.

Other neck: No mass or lymphadenopathy.

Upper chest: Pleural effusions layering dependently, right more than
left. Areas of abnormal patchy density in both upper lobes, left
more than right. I would recommend a complete chest CT when able to
better assess the pulmonary disease. Specifically, there could be
focal infiltrate or mass lesion in the anterior left upper lobe.

Review of the MIP images confirms the above findings

CTA HEAD FINDINGS

Anterior circulation: Both internal carotid arteries are patent
through the skull base and siphon regions. Ordinary siphon
atherosclerosis but no stenosis greater than 30%. There is motion
degradation unfortunately in the region of the circle[place]of[place]Willis. No
large vessel occlusion identifiable at this time.

Posterior circulation: Both vertebral arteries are patent to the
basilar. No basilar stenosis. Posterior circulation branch vessels
show flow. Again, there is motion degradation that limits precise
evaluation.

Venous sinuses: Patent and normal.

Anatomic variants: None significant.

Review of the MIP images confirms the above findings
IMPRESSION: 1. Motion degradation unfortunately in the region of the
circle-of[place]Willis. No intracranial large vessel occlusion
identifiable at this time.
2. Atherosclerotic disease at both carotid bifurcations and ICA bulb
regions. 80% or greater stenosis of the ICA bulb on the right due to
calcified and prominent soft plaque. This could certainly be the
source of embolic disease in the right MCA territory. No measurable
stenosis on the left.
3. Pleural effusions layering dependently, right more than left.
Areas of abnormal patchy density in both upper lobes, left more than
right. I would recommend a complete chest CT when able to better
assess the pulmonary disease. Specifically, there could be focal
infiltrate or mass lesion in the anterior left upper lobe.

## 2022-02-14 ENCOUNTER — Encounter: Payer: Self-pay | Admitting: Family Medicine

## 2022-02-14 MED ORDER — AMOXICILLIN-POT CLAVULANATE 875-125 MG PO TABS: 1.0000 | ORAL_TABLET | Freq: Two times a day (BID) | ORAL | 0 refills | Status: AC

## 2022-02-17 ENCOUNTER — Encounter: Payer: Self-pay | Admitting: Family Medicine

## 2022-02-22 ENCOUNTER — Inpatient Hospital Stay (HOSPITAL_COMMUNITY)
Admission: EM | Admit: 2022-02-22 | Discharge: 2022-03-11 | DRG: 377 | Disposition: E | Payer: Medicare Other | Attending: Internal Medicine | Admitting: Internal Medicine

## 2022-02-22 ENCOUNTER — Other Ambulatory Visit: Payer: Self-pay

## 2022-02-22 ENCOUNTER — Ambulatory Visit: Payer: Medicare Other | Admitting: Cardiology

## 2022-02-22 DIAGNOSIS — Z7952 Long term (current) use of systemic steroids: Secondary | ICD-10-CM

## 2022-02-22 DIAGNOSIS — I110 Hypertensive heart disease with heart failure: Secondary | ICD-10-CM | POA: Diagnosis not present

## 2022-02-22 DIAGNOSIS — K922 Gastrointestinal hemorrhage, unspecified: Secondary | ICD-10-CM

## 2022-02-22 DIAGNOSIS — Z85828 Personal history of other malignant neoplasm of skin: Secondary | ICD-10-CM

## 2022-02-22 DIAGNOSIS — D696 Thrombocytopenia, unspecified: Secondary | ICD-10-CM | POA: Diagnosis present

## 2022-02-22 DIAGNOSIS — I130 Hypertensive heart and chronic kidney disease with heart failure and stage 1 through stage 4 chronic kidney disease, or unspecified chronic kidney disease: Secondary | ICD-10-CM | POA: Diagnosis present

## 2022-02-22 DIAGNOSIS — Z8249 Family history of ischemic heart disease and other diseases of the circulatory system: Secondary | ICD-10-CM

## 2022-02-22 DIAGNOSIS — Z79899 Other long term (current) drug therapy: Secondary | ICD-10-CM

## 2022-02-22 DIAGNOSIS — E875 Hyperkalemia: Secondary | ICD-10-CM | POA: Diagnosis present

## 2022-02-22 DIAGNOSIS — D6959 Other secondary thrombocytopenia: Secondary | ICD-10-CM | POA: Diagnosis present

## 2022-02-22 DIAGNOSIS — N390 Urinary tract infection, site not specified: Secondary | ICD-10-CM | POA: Diagnosis present

## 2022-02-22 DIAGNOSIS — T451X5A Adverse effect of antineoplastic and immunosuppressive drugs, initial encounter: Secondary | ICD-10-CM | POA: Diagnosis present

## 2022-02-22 DIAGNOSIS — K317 Polyp of stomach and duodenum: Secondary | ICD-10-CM | POA: Diagnosis present

## 2022-02-22 DIAGNOSIS — E222 Syndrome of inappropriate secretion of antidiuretic hormone: Secondary | ICD-10-CM | POA: Diagnosis present

## 2022-02-22 DIAGNOSIS — Z7189 Other specified counseling: Secondary | ICD-10-CM

## 2022-02-22 DIAGNOSIS — D631 Anemia in chronic kidney disease: Secondary | ICD-10-CM | POA: Diagnosis present

## 2022-02-22 DIAGNOSIS — Z7960 Long term (current) use of unspecified immunomodulators and immunosuppressants: Secondary | ICD-10-CM

## 2022-02-22 DIAGNOSIS — E1165 Type 2 diabetes mellitus with hyperglycemia: Secondary | ICD-10-CM | POA: Diagnosis present

## 2022-02-22 DIAGNOSIS — I2510 Atherosclerotic heart disease of native coronary artery without angina pectoris: Secondary | ICD-10-CM | POA: Diagnosis not present

## 2022-02-22 DIAGNOSIS — K5791 Diverticulosis of intestine, part unspecified, without perforation or abscess with bleeding: Principal | ICD-10-CM | POA: Diagnosis present

## 2022-02-22 DIAGNOSIS — Z794 Long term (current) use of insulin: Secondary | ICD-10-CM

## 2022-02-22 DIAGNOSIS — Z8673 Personal history of transient ischemic attack (TIA), and cerebral infarction without residual deficits: Secondary | ICD-10-CM

## 2022-02-22 DIAGNOSIS — F419 Anxiety disorder, unspecified: Secondary | ICD-10-CM | POA: Diagnosis present

## 2022-02-22 DIAGNOSIS — D62 Acute posthemorrhagic anemia: Secondary | ICD-10-CM | POA: Diagnosis not present

## 2022-02-22 DIAGNOSIS — Z833 Family history of diabetes mellitus: Secondary | ICD-10-CM

## 2022-02-22 DIAGNOSIS — E119 Type 2 diabetes mellitus without complications: Secondary | ICD-10-CM

## 2022-02-22 DIAGNOSIS — D132 Benign neoplasm of duodenum: Secondary | ICD-10-CM

## 2022-02-22 DIAGNOSIS — N170 Acute kidney failure with tubular necrosis: Secondary | ICD-10-CM | POA: Diagnosis present

## 2022-02-22 DIAGNOSIS — N184 Chronic kidney disease, stage 4 (severe): Secondary | ICD-10-CM | POA: Diagnosis present

## 2022-02-22 DIAGNOSIS — E1122 Type 2 diabetes mellitus with diabetic chronic kidney disease: Secondary | ICD-10-CM | POA: Diagnosis present

## 2022-02-22 DIAGNOSIS — R578 Other shock: Secondary | ICD-10-CM | POA: Diagnosis not present

## 2022-02-22 DIAGNOSIS — Z66 Do not resuscitate: Secondary | ICD-10-CM | POA: Diagnosis not present

## 2022-02-22 DIAGNOSIS — D72819 Decreased white blood cell count, unspecified: Secondary | ICD-10-CM | POA: Diagnosis present

## 2022-02-22 DIAGNOSIS — E785 Hyperlipidemia, unspecified: Secondary | ICD-10-CM | POA: Diagnosis present

## 2022-02-22 DIAGNOSIS — I5043 Acute on chronic combined systolic (congestive) and diastolic (congestive) heart failure: Secondary | ICD-10-CM | POA: Diagnosis present

## 2022-02-22 DIAGNOSIS — I639 Cerebral infarction, unspecified: Secondary | ICD-10-CM | POA: Diagnosis present

## 2022-02-22 DIAGNOSIS — G9341 Metabolic encephalopathy: Secondary | ICD-10-CM | POA: Diagnosis present

## 2022-02-22 DIAGNOSIS — R627 Adult failure to thrive: Secondary | ICD-10-CM | POA: Diagnosis present

## 2022-02-22 DIAGNOSIS — F17210 Nicotine dependence, cigarettes, uncomplicated: Secondary | ICD-10-CM | POA: Diagnosis not present

## 2022-02-22 DIAGNOSIS — I252 Old myocardial infarction: Secondary | ICD-10-CM

## 2022-02-22 DIAGNOSIS — I10 Essential (primary) hypertension: Secondary | ICD-10-CM | POA: Diagnosis present

## 2022-02-22 DIAGNOSIS — K449 Diaphragmatic hernia without obstruction or gangrene: Secondary | ICD-10-CM | POA: Diagnosis not present

## 2022-02-22 DIAGNOSIS — D849 Immunodeficiency, unspecified: Secondary | ICD-10-CM | POA: Diagnosis present

## 2022-02-22 DIAGNOSIS — I509 Heart failure, unspecified: Secondary | ICD-10-CM | POA: Diagnosis not present

## 2022-02-22 DIAGNOSIS — Z7901 Long term (current) use of anticoagulants: Secondary | ICD-10-CM | POA: Diagnosis not present

## 2022-02-22 DIAGNOSIS — Z888 Allergy status to other drugs, medicaments and biological substances status: Secondary | ICD-10-CM

## 2022-02-22 DIAGNOSIS — Z515 Encounter for palliative care: Secondary | ICD-10-CM

## 2022-02-22 DIAGNOSIS — Z940 Kidney transplant status: Secondary | ICD-10-CM

## 2022-02-22 DIAGNOSIS — Z882 Allergy status to sulfonamides status: Secondary | ICD-10-CM

## 2022-02-22 DIAGNOSIS — Z79621 Long term (current) use of calcineurin inhibitor: Secondary | ICD-10-CM

## 2022-02-22 DIAGNOSIS — F17200 Nicotine dependence, unspecified, uncomplicated: Secondary | ICD-10-CM | POA: Diagnosis present

## 2022-02-22 DIAGNOSIS — R1110 Vomiting, unspecified: Secondary | ICD-10-CM | POA: Diagnosis present

## 2022-02-22 DIAGNOSIS — I480 Paroxysmal atrial fibrillation: Secondary | ICD-10-CM | POA: Diagnosis present

## 2022-02-22 DIAGNOSIS — E871 Hypo-osmolality and hyponatremia: Secondary | ICD-10-CM | POA: Diagnosis present

## 2022-02-22 DIAGNOSIS — T8619 Other complication of kidney transplant: Secondary | ICD-10-CM | POA: Diagnosis present

## 2022-02-22 DIAGNOSIS — Y830 Surgical operation with transplant of whole organ as the cause of abnormal reaction of the patient, or of later complication, without mention of misadventure at the time of the procedure: Secondary | ICD-10-CM | POA: Diagnosis present

## 2022-02-22 DIAGNOSIS — B9620 Unspecified Escherichia coli [E. coli] as the cause of diseases classified elsewhere: Secondary | ICD-10-CM | POA: Diagnosis present

## 2022-02-22 DIAGNOSIS — Z832 Family history of diseases of the blood and blood-forming organs and certain disorders involving the immune mechanism: Secondary | ICD-10-CM

## 2022-02-22 LAB — COMPREHENSIVE METABOLIC PANEL
ALT: 16 U/L (ref 0–44)
AST: 22 U/L (ref 15–41)
Albumin: 2.6 g/dL — ABNORMAL LOW (ref 3.5–5.0)
Alkaline Phosphatase: 55 U/L (ref 38–126)
Anion gap: 15 (ref 5–15)
BUN: 112 mg/dL — ABNORMAL HIGH (ref 8–23)
CO2: 13 mmol/L — ABNORMAL LOW (ref 22–32)
Calcium: 6.9 mg/dL — ABNORMAL LOW (ref 8.9–10.3)
Chloride: 95 mmol/L — ABNORMAL LOW (ref 98–111)
Creatinine, Ser: 4.93 mg/dL — ABNORMAL HIGH (ref 0.61–1.24)
GFR, Estimated: 12 mL/min — ABNORMAL LOW (ref >60)
Glucose, Bld: 210 mg/dL — ABNORMAL HIGH (ref 70–99)
Potassium: 5.4 mmol/L — ABNORMAL HIGH (ref 3.5–5.1)
Sodium: 123 mmol/L — ABNORMAL LOW (ref 135–145)
Total Bilirubin: 0.8 mg/dL (ref 0.3–1.2)
Total Protein: 4.8 g/dL — ABNORMAL LOW (ref 6.5–8.1)

## 2022-02-22 LAB — CBC WITH DIFFERENTIAL/PLATELET
Abs Immature Granulocytes: 0.27 10*3/uL — ABNORMAL HIGH (ref 0.00–0.07)
Basophils Absolute: 0 10*3/uL (ref 0.0–0.1)
Basophils Relative: 0 %
Eosinophils Absolute: 0.1 10*3/uL (ref 0.0–0.5)
Eosinophils Relative: 3 %
HCT: 18.1 % — ABNORMAL LOW (ref 39.0–52.0)
Hemoglobin: 5.5 g/dL — CL (ref 13.0–17.0)
Immature Granulocytes: 7 %
Lymphocytes Relative: 15 %
Lymphs Abs: 0.6 10*3/uL — ABNORMAL LOW (ref 0.7–4.0)
MCH: 28.1 pg (ref 26.0–34.0)
MCHC: 30.4 g/dL (ref 30.0–36.0)
MCV: 92.3 fL (ref 80.0–100.0)
Monocytes Absolute: 0.3 10*3/uL (ref 0.1–1.0)
Monocytes Relative: 8 %
Neutro Abs: 2.5 10*3/uL (ref 1.7–7.7)
Neutrophils Relative %: 67 %
Platelets: 99 10*3/uL — ABNORMAL LOW (ref 150–400)
RBC: 1.96 MIL/uL — ABNORMAL LOW (ref 4.22–5.81)
RDW: 18.5 % — ABNORMAL HIGH (ref 11.5–15.5)
WBC: 3.7 10*3/uL — ABNORMAL LOW (ref 4.0–10.5)
nRBC: 0 % (ref 0.0–0.2)

## 2022-02-22 LAB — URINALYSIS, ROUTINE W REFLEX MICROSCOPIC
Bacteria, UA: NONE SEEN
Bilirubin Urine: NEGATIVE
Glucose, UA: 50 mg/dL — AB
Hgb urine dipstick: NEGATIVE
Ketones, ur: NEGATIVE mg/dL
Nitrite: NEGATIVE
Protein, ur: 100 mg/dL — AB
Specific Gravity, Urine: 1.013 (ref 1.005–1.030)
WBC, UA: >50 WBC/hpf — ABNORMAL HIGH (ref 0–5)
pH: 5 (ref 5.0–8.0)

## 2022-02-22 LAB — PREPARE RBC (CROSSMATCH)

## 2022-02-22 LAB — LIPASE, BLOOD: Lipase: 22 U/L (ref 11–51)

## 2022-02-22 LAB — TSH: TSH: 8.643 u[IU]/mL — ABNORMAL HIGH (ref 0.350–4.500)

## 2022-02-22 LAB — CBG MONITORING, ED
Glucose-Capillary: 149 mg/dL — ABNORMAL HIGH (ref 70–99)
Glucose-Capillary: 172 mg/dL — ABNORMAL HIGH (ref 70–99)
Glucose-Capillary: 194 mg/dL — ABNORMAL HIGH (ref 70–99)

## 2022-02-22 LAB — PROTIME-INR
INR: 2.3 — ABNORMAL HIGH (ref 0.8–1.2)
Prothrombin Time: 24.9 seconds — ABNORMAL HIGH (ref 11.4–15.2)

## 2022-02-22 MED ORDER — ONDANSETRON HCL 4 MG/2ML IJ SOLN
4.0000 mg | Freq: Once | INTRAMUSCULAR | Status: AC
  Administered 2022-02-22: 4 mg via INTRAVENOUS
  Filled 2022-02-22: qty 2

## 2022-02-22 MED ORDER — FLUTICASONE PROPIONATE 50 MCG/ACT NA SUSP: 1.0000 | Freq: Every day | NASAL | Status: DC | PRN

## 2022-02-22 MED ORDER — SODIUM CHLORIDE 0.9 % IV SOLN: 250.0000 mL | INTRAVENOUS | Status: DC | PRN

## 2022-02-22 MED ORDER — SODIUM ZIRCONIUM CYCLOSILICATE 5 G PO PACK
10.0000 g | PACK | Freq: Once | ORAL | Status: AC
  Administered 2022-02-22: 10 g via ORAL
  Filled 2022-02-22: qty 2

## 2022-02-22 MED ORDER — TACROLIMUS 0.5 MG PO CAPS
0.5000 mg | ORAL_CAPSULE | Freq: Two times a day (BID) | ORAL | Status: DC
  Administered 2022-02-22 – 2022-02-24 (×4): 0.5 mg via ORAL
  Filled 2022-02-22 (×7): qty 1

## 2022-02-22 MED ORDER — INSULIN ASPART 100 UNIT/ML IJ SOLN
0.0000 [IU] | INTRAMUSCULAR | Status: DC
  Administered 2022-02-22: 2 [IU] via SUBCUTANEOUS
  Administered 2022-02-22 – 2022-02-23 (×3): 3 [IU] via SUBCUTANEOUS
  Administered 2022-02-23: 2 [IU] via SUBCUTANEOUS
  Administered 2022-02-23: 5 [IU] via SUBCUTANEOUS
  Administered 2022-02-23 – 2022-02-24 (×2): 3 [IU] via SUBCUTANEOUS
  Filled 2022-02-22 (×4): qty 1

## 2022-02-22 MED ORDER — SODIUM CHLORIDE 0.9 % IV BOLUS
500.0000 mL | Freq: Once | INTRAVENOUS | Status: AC
  Administered 2022-02-22: 500 mL via INTRAVENOUS

## 2022-02-22 MED ORDER — SODIUM CHLORIDE 0.9% FLUSH: 3.0000 mL | INTRAVENOUS | Status: DC | PRN

## 2022-02-22 MED ORDER — ACETAMINOPHEN 325 MG PO TABS: 650.0000 mg | ORAL_TABLET | Freq: Four times a day (QID) | ORAL | Status: DC | PRN

## 2022-02-22 MED ORDER — PANTOPRAZOLE INFUSION (NEW) - SIMPLE MED
8.0000 mg/h | INTRAVENOUS | Status: DC
  Administered 2022-02-22 – 2022-02-23 (×3): 8 mg/h via INTRAVENOUS
  Filled 2022-02-22 (×4): qty 100

## 2022-02-22 MED ORDER — SODIUM CHLORIDE 0.9% FLUSH
3.0000 mL | Freq: Two times a day (BID) | INTRAVENOUS | Status: DC
  Administered 2022-02-22 – 2022-02-24 (×6): 3 mL via INTRAVENOUS

## 2022-02-22 MED ORDER — ATORVASTATIN CALCIUM 40 MG PO TABS
40.0000 mg | ORAL_TABLET | Freq: Every day | ORAL | Status: DC
  Administered 2022-02-22 – 2022-02-24 (×3): 40 mg via ORAL
  Filled 2022-02-22 (×3): qty 1

## 2022-02-22 MED ORDER — LORATADINE 10 MG PO TABS: 10.0000 mg | ORAL_TABLET | Freq: Every day | ORAL | Status: DC | PRN

## 2022-02-22 MED ORDER — AMIODARONE HCL 100 MG PO TABS
100.0000 mg | ORAL_TABLET | Freq: Every day | ORAL | Status: DC
  Administered 2022-02-24: 100 mg via ORAL
  Filled 2022-02-22 (×3): qty 1

## 2022-02-22 MED ORDER — ONDANSETRON HCL 4 MG/2ML IJ SOLN
4.0000 mg | Freq: Four times a day (QID) | INTRAMUSCULAR | Status: DC | PRN
  Administered 2022-02-22 – 2022-02-25 (×2): 4 mg via INTRAVENOUS
  Filled 2022-02-22 (×2): qty 2

## 2022-02-22 MED ORDER — ALBUTEROL SULFATE HFA 108 (90 BASE) MCG/ACT IN AERS: 2.0000 | INHALATION_SPRAY | Freq: Four times a day (QID) | RESPIRATORY_TRACT | Status: DC | PRN

## 2022-02-22 MED ORDER — ALBUTEROL SULFATE (2.5 MG/3ML) 0.083% IN NEBU: 2.5000 mg | INHALATION_SOLUTION | Freq: Four times a day (QID) | RESPIRATORY_TRACT | Status: DC | PRN

## 2022-02-22 MED ORDER — PREDNISONE 5 MG PO TABS
5.0000 mg | ORAL_TABLET | Freq: Every day | ORAL | Status: DC
  Administered 2022-02-23 – 2022-02-24 (×2): 5 mg via ORAL
  Filled 2022-02-22 (×2): qty 1

## 2022-02-22 MED ORDER — ONDANSETRON HCL 4 MG PO TABS: 4.0000 mg | ORAL_TABLET | Freq: Four times a day (QID) | ORAL | Status: DC | PRN

## 2022-02-22 MED ORDER — PANTOPRAZOLE 80MG IVPB - SIMPLE MED
80.0000 mg | Freq: Once | INTRAVENOUS | Status: AC
  Administered 2022-02-22: 80 mg via INTRAVENOUS
  Filled 2022-02-22: qty 100

## 2022-02-22 MED ORDER — MYCOPHENOLATE MOFETIL 250 MG PO CAPS
250.0000 mg | ORAL_CAPSULE | Freq: Two times a day (BID) | ORAL | Status: DC
  Administered 2022-02-22 – 2022-02-24 (×5): 250 mg via ORAL
  Filled 2022-02-22 (×6): qty 1

## 2022-02-22 MED ORDER — ACETAMINOPHEN 650 MG RE SUPP: 650.0000 mg | Freq: Four times a day (QID) | RECTAL | Status: DC | PRN

## 2022-02-22 MED ORDER — EZETIMIBE 10 MG PO TABS
10.0000 mg | ORAL_TABLET | Freq: Every day | ORAL | Status: DC
  Administered 2022-02-22 – 2022-02-24 (×3): 10 mg via ORAL
  Filled 2022-02-22 (×3): qty 1

## 2022-02-22 MED ORDER — VERICIGUAT 2.5 MG PO TABS
2.5000 mg | ORAL_TABLET | Freq: Every day | ORAL | Status: DC
  Filled 2022-02-22 (×3): qty 1

## 2022-02-22 MED ORDER — ALPRAZOLAM 0.25 MG PO TABS: 0.2500 mg | ORAL_TABLET | Freq: Every evening | ORAL | Status: DC | PRN

## 2022-02-22 NOTE — ED Triage Notes (Signed)
Pt bib ems from home c/o n/v/d x 1 week after starting abx for UTI. AO x 4.  91/52 HR 65 SPO2 98% CBG 269 NS 500, Zofran '4mg'$  IV given by EMS

## 2022-02-22 NOTE — ED Provider Notes (Signed)
[facility] EMERGENCY DEPARTMENT Provider Note   CSN: [id number] Arrival date & time: 02/27/2022  7654     History  Chief Complaint  Patient presents with   Vomiting   Diarrhea   HPI [name] is a 77 y.o. male with PMH of CHF, diabetes, A-fib, hyperlipidemia, hypertension, CKD and kidney transplant presenting for 1 week of nausea, vomiting, and  diarrhea.  Denies abdominal pain.  Diarrhea has been bloody the last few days.  Blood is dark in color per daughter. Vomiting is nonbloody and nonbilious.  Dates he cannot "keep anything down".  Denies fever or chills.  Started antibiotic for UTI about a week ago.  Patient believes that symptoms are related to antibiotic use.  Patient is on Eliquis for A-fib.   Diarrhea      Home Medications Prior to Admission medications   Medication Sig Start Date End Date Taking? Authorizing Provider  acetaminophen (TYLENOL) 500 MG tablet Take 500 mg by mouth every 8 (eight) hours as needed for moderate pain.   Yes [provider]  albuterol (VENTOLIN HFA) 108 (90 Base) MCG/ACT inhaler Inhale 2 puffs into the lungs every 6 (six) hours as needed for wheezing or shortness of breath. 11/13/21  Yes Stacks, [name], MD  ALPRAZolam Duanne Moron) 0.25 MG tablet Take 1 tablet (0.25 mg total) by mouth at bedtime as needed for anxiety. 12/28/21  Yes Claretta Fraise, MD  amiodarone (PACERONE) 200 MG tablet Take 0.5 tablets (100 mg total) by mouth daily. 10/14/21  Yes Larey Dresser, MD  amoxicillin-clavulanate (AUGMENTIN) 875-125 MG tablet Take 1 tablet by mouth 2 (two) times daily. Take all of this medication 02/14/22  Yes Stacks, [name], MD  atorvastatin (LIPITOR) 40 MG tablet Take 1 tablet (40 mg total) by mouth daily. 11/10/21  Yes Stacks, [name], MD  ELIQUIS 5 MG TABS tablet Take 1 tablet by mouth twice daily 11/09/21  Yes Larey Dresser, MD  ezetimibe (ZETIA) 10 MG tablet Take 1 tablet (10 mg total) by mouth daily. 08/17/21  Yes Satira Sark, MD   fluticasone [facility]) 50 MCG/ACT nasal spray Place 1 spray into both nostrils daily as needed for allergies. 12/21/21  Yes Johnson, Clanford L, MD  insulin NPH Human (NOVOLIN N) 100 UNIT/ML injection 10 units AC breakfast and 10 units AC supper 08/05/21  Yes Stacks, [name], MD  insulin regular (NOVOLIN R) 100 units/mL injection 10 units w bkfst and 6 units w dinner 01/05/22  Yes Domenic Polite, MD  loratadine (CLARITIN) 10 MG tablet Take 10 mg by mouth daily as needed for allergies. 10/24/06  Yes [provider]  metoprolol succinate (TOPROL-XL) 25 MG 24 hr tablet Take 1 tablet (25 mg total) by mouth daily. Take with or immediately following a meal. 12/21/21  Yes Johnson, Clanford L, MD  mycophenolate (CELLCEPT) 250 MG capsule Take 250 mg by mouth 2 (two) times daily.   Yes [provider]  nitroGLYCERIN (NITROSTAT) 0.4 MG SL tablet Place 1 tablet (0.4 mg total) under the tongue every 5 (five) minutes as needed for chest pain. 11/05/20  Yes Larey Dresser, MD  ondansetron (ZOFRAN) 4 MG tablet TAKE 1 TABLET BY MOUTH EVERY 8 HOURS AS NEEDED FOR NAUSEA FOR VOMITING 01/24/22  Yes Stacks, [name], MD  predniSONE (DELTASONE) 5 MG tablet Take 5 mg by mouth daily with breakfast.   Yes [provider]  tacrolimus (PROGRAF) 0.5 MG capsule Take 1 mg by mouth in the morning and at bedtime. 07/08/15  Yes [provider]  torsemide (DEMADEX) 20 MG tablet Take 2 tablets (40 mg total) by mouth daily. 01/26/22  Yes Milford, Jessica M, FNP  Vericiguat (VERQUVO) 2.5 MG TABS Take 2.5 mg by mouth daily. 12/01/21  Yes Larey Dresser, MD      Allergies    Other, Tape, Trazodone and nefazodone, Mirtazapine, Elemental sulfur, and Sulfa antibiotics    Review of Systems   Review of Systems  Gastrointestinal:  Positive for diarrhea.    Physical Exam   Vitals:   02/23/2022 1430 03/10/2022 1501  BP: (!) 106/51   Pulse: (!) 54   Resp:    Temp:  97.9 F (36.6 C)  SpO2: 99%      CONSTITUTIONAL:  ill-appearing, NAD NEURO:  Alert and oriented x 3, CN 3-12 grossly intact EYES:  eyes equal and reactive ENT/NECK:  Supple, no stridor  CARDIO:  bradycardic and regular rhythm, appears well-perfused PULM:  No respiratory distress, CTAB GI/GU:  non-distended, soft, non tender, periumbilical ecchymosis noted.  Copious loose maroon-colored stool noted at the anus. Hemoccult positive MSK/SPINE:  No gross deformities, no edema, moves all extremities  SKIN:  no rash, atraumatic, pale   *Additional and/or pertinent findings included in MDM below    ED Results / Procedures / Treatments   Labs (all labs ordered are listed, but only abnormal results are displayed) Labs Reviewed  CBC WITH DIFFERENTIAL/PLATELET - Abnormal; Notable for the following components:      Result Value   WBC 3.7 (*)    RBC 1.96 (*)    Hemoglobin 5.5 (*)    HCT 18.1 (*)    RDW 18.5 (*)    Platelets 99 (*)    Lymphs Abs 0.6 (*)    Abs Immature Granulocytes 0.27 (*)    All other components within normal limits  COMPREHENSIVE METABOLIC PANEL - Abnormal; Notable for the following components:   Sodium 123 (*)    Potassium 5.4 (*)    Chloride 95 (*)    CO2 13 (*)    Glucose, Bld 210 (*)    BUN 112 (*)    Creatinine, Ser 4.93 (*)    Calcium 6.9 (*)    Total Protein 4.8 (*)    Albumin 2.6 (*)    GFR, Estimated 12 (*)    All other components within normal limits  URINALYSIS, ROUTINE W REFLEX MICROSCOPIC - Abnormal; Notable for the following components:   APPearance HAZY (*)    Glucose, UA 50 (*)    Protein, ur 100 (*)    Leukocytes,Ua MODERATE (*)    WBC, UA >50 (*)    All other components within normal limits  PROTIME-INR - Abnormal; Notable for the following components:   Prothrombin Time 24.9 (*)    INR 2.3 (*)    All other components within normal limits  TSH - Abnormal; Notable for the following components:   TSH 8.643 (*)    All other components within normal limits  CBG  MONITORING, ED - Abnormal; Notable for the following components:   Glucose-Capillary 194 (*)    All other components within normal limits  URINE CULTURE  LIPASE, BLOOD  OSMOLALITY  OSMOLALITY, URINE  TYPE AND SCREEN  PREPARE RBC (CROSSMATCH)    EKG EKG Interpretation  Date/Time:  Monday February 22 2022 13:06:27 EST Ventricular Rate:  55 PR Interval:    QRS Duration: 134 QT Interval:  490 QTC Calculation: 469 R Axis:   100 Text Interpretation: sinus rhythm Nonspecific intraventricular conduction  delay Inferior infarct, old Anterolateral infarct, age indeterminate No significant change since prior 12/23 Confirmed by Aletta Edouard [phone]) on 03/07/2022 1:11:41 PM  Radiology No results found.  Procedures .Critical Care  Performed by: Harriet Pho, PA-C Authorized by: Harriet Pho, PA-C   Critical care provider statement:    Critical care time (minutes):  30   Critical care was necessary to treat or prevent imminent or life-threatening deterioration of the following conditions:  Renal failure (GI bleed)   Critical care was time spent personally by me on the following activities:  Development of treatment plan with patient or surrogate, discussions with consultants, evaluation of patient's response to treatment, examination of patient, ordering and review of laboratory studies, ordering and review of radiographic studies, ordering and performing treatments and interventions, pulse oximetry, re-evaluation of patient's condition and review of old charts     Medications Ordered in ED Medications  pantoprozole (PROTONIX) 80 mg /NS 100 mL infusion (8 mg/hr Intravenous New Bag/Given 03/10/2022 1445)  amiodarone (PACERONE) tablet 100 mg (0 mg Oral Hold 03/08/2022 1503)  atorvastatin (LIPITOR) tablet 40 mg (40 mg Oral Given 03/10/2022 1437)  ezetimibe (ZETIA) tablet 10 mg (10 mg Oral Given 02/20/2022 1439)  Vericiguat TABS 2.5 mg (2.5 mg Oral Not Given 02/18/2022 1458)  ALPRAZolam (XANAX)  tablet 0.25 mg (has no administration in time range)  predniSONE (DELTASONE) tablet 5 mg (has no administration in time range)  mycophenolate (CELLCEPT) capsule 250 mg (has no administration in time range)  tacrolimus (PROGRAF) capsule 0.5 mg (has no administration in time range)  fluticasone (FLONASE) 50 MCG/ACT nasal spray 1 spray (has no administration in time range)  loratadine (CLARITIN) tablet 10 mg (has no administration in time range)  sodium chloride flush (NS) 0.9 % injection 3 mL (3 mLs Intravenous Given 02/25/2022 1356)  sodium chloride flush (NS) 0.9 % injection 3 mL (has no administration in time range)  0.9 %  sodium chloride infusion (has no administration in time range)  acetaminophen (TYLENOL) tablet 650 mg (has no administration in time range)    Or  acetaminophen (TYLENOL) suppository 650 mg (has no administration in time range)  ondansetron (ZOFRAN) tablet 4 mg (has no administration in time range)    Or  ondansetron (ZOFRAN) injection 4 mg (has no administration in time range)  insulin aspart (novoLOG) injection 0-15 Units (has no administration in time range)  albuterol (PROVENTIL) (2.5 MG/3ML) 0.083% nebulizer solution 2.5 mg (has no administration in time range)  ondansetron (ZOFRAN) injection 4 mg (4 mg Intravenous Given 03/07/2022 1036)  sodium chloride 0.9 % bolus 500 mL (0 mLs Intravenous Stopped 02/27/2022 1332)  pantoprazole (PROTONIX) 80 mg /NS 100 mL IVPB (0 mg Intravenous Stopped 02/18/2022 1332)  sodium zirconium cyclosilicate (LOKELMA) packet 10 g (10 g Oral Given 02/23/2022 1458)    ED Course/ Medical Decision Making/ A&P Clinical Course as of 02/14/2022 1645  Mon Feb 23, 7119  2556 77 year old male here with nausea vomiting diarrhea.  Has benign abdominal exam.  Hemoglobin low at 5.5.  Chronic CKD.  Getting fluids PPI and will need transfusion and GI consult for admission. [MB]    Clinical Course User Index [MB] Hayden Rasmussen, MD                              Medical Decision Making Amount and/or Complexity of Data Reviewed Labs: ordered.  Risk Prescription drug management. Decision regarding hospitalization.   Initial  Impression and Ddx 77 year old male who is ill-appearing but otherwise hemodynamically stable presenting for nausea vomiting diarrhea.  Physical exam notable for melena.  Differential diagnosis for this complaint includes upper GI bleed, lower GI bleed, symptomatic anemia, AKI and sepsis. Patient PMH that increases complexity of ED encounter: A-fib on Eliquis, ckd, diabetes, hypertension hyperlipidemia  Interpretation of Diagnostics I independent reviewed and interpreted the labs as followed: Anemia, elevated BUN/creatinine, hypernatremia, hyponatremia, hyperglycemia  - I independently reviewed and interpreted EKG which revealed sinus rhythm.  Patient Reassessment and Ultimate Disposition/Management Given recent history of persistent melena and hemoglobin of 5.5 on a blood thinner, initial concern was GI bleed.  Volume resuscitated with normal saline bolus.  Treated with 2 units of blood, IV Protonix.  Consulted GI who did advised to admit, start clear liquid diet and stated that Dr. Eulas Post would come by and evaluate him in person this afternoon.  Admitted to hospital service for ongoing GI bleed.  Patient management required discussion with the following services or consulting groups:  None  Complexity of Problems Addressed Acute complicated illness or Injury  Additional Data Reviewed and Analyzed Further history obtained from: Further history from spouse/family member, Past medical history and medications listed in the EMR, and Prior ED visit notes  Patient Encounter Risk Assessment Prescriptions and Consideration of hospitalization         Final Clinical Impression(s) / ED Diagnoses Final diagnoses:  Gastrointestinal hemorrhage, unspecified gastrointestinal hemorrhage type    Rx / DC Orders ED Discharge  Orders     None         Harriet Pho, PA-C 02/25/2022 1645    Hayden Rasmussen, MD 02/08/2022 1750

## 2022-02-22 NOTE — H&P (Signed)
History and Physical    Daniel Myers DUK:[id number] DOB: Mar 20, 1945 DOA: 02/21/2022  PCP: Claretta Fraise, MD   Patient coming from: Home  Chief Complaint: N/V/D with dark stools  HPI: Daniel Myers is a 77 y.o. 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  He has had significant weakness in the last few days.  He was apparently started on Augmentin for a  UTI about a week ago and it is thought that his symptoms are related to the antibiotic he was started on.  He apparently finished about 5 days of the antibiotic and then could not tolerate taking anymore.   ED Course: Stable vital signs noted and patient is afebrile.  Hemoglobin 5.5, potassium 5.4, creatinine 4.93, sodium 123.  Patient started on PPI infusion in the ED and given 500 mL fluid bolus.  2 unit PRBCs ordered for transfusion.  GI consulted with plans to follow-up.  Review of Systems: Reviewed as noted above, otherwise negative.  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   nodular on left jawline - excision   Basal cell carcinoma 07/01/2014   nodular on left hawling - CX3+5FU+excision   Basal cell carcinoma 10/10/2014   nodular on right forearm, middle - tx p bx   Basal cell carcinoma 02/11/2015   left neck - CX3 + excision   Basal cell carcinoma 06/14/2016   left jawline - CX3+5FU   Basal cell carcinoma 02/22/2017   superficial and nodular on left neck - excision   Basal cell carcinoma 04/11/2018   superficial and nodular on right inferior forearm - CX3+Cautery+5FU   Chronic kidney disease    History of renal transplant    Hyperlipidemia    Hypertension    NSTEMI (non-ST elevated myocardial infarction) ([place]) 02/13/2020    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   Left [place] Cheek (well diff)   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   Right Breast (in situ)   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   Left Forearm - anterior (mod diff)   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   Neck - anterior (mod diff)   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   in situ on left arm - CX3+5FU   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   hypertrophic on left ear - CX3+5FU   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   in situ on right temple - CX3+5FU   Squamous cell carcinoma of skin 11/08/2011   right upper forearm - tx p bx   Squamous cell carcinoma of skin 11/08/2011   left upper forearm - tx p bx   Squamous cell carcinoma of skin 11/08/2011   left hand - tx p bx   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/27/2013   in situ on left lower back - CX3+5FU   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/27/2013   in situ on left forehead - CX3+5FU   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/27/2013   in situ on right temple - watch per ST   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/27/2013   in situ on right forearm - CX3+5FU   Squamous cell carcinoma of skin 07/01/2014   well differentiated on right sideburn -  CX3+5FU   Squamous cell carcinoma of skin 07/01/2014   in situ on left shoulder - CX3+5FU   Squamous cell carcinoma of skin 07/01/2014   in situ on right forearm, proximal - CX3+5FU+Cautery   Squamous cell carcinoma of skin 07/01/2014   in situ on right forearm, distal - CX3+5FU   Squamous cell carcinoma of skin 09/30/2015   in situ on posterior left ear - CX3+5FU   Squamous cell carcinoma of skin 02/22/2017   in situ on right upper arm - CX3+5FU   Squamous cell carcinoma of skin 02/22/2017   in situ on left upper arm - CX3+5FU   Squamous cell carcinoma of skin 04/11/2018   in situ on right temple - CX3+5FU   Squamous cell carcinoma of skin 04/11/2018   in situ on lateral right arm -  MOHs   Squamous cell carcinoma of skin 04/11/2018   in situ on right upper arm - MOHs   Squamous cell carcinoma of skin 04/11/2018   in situ on left sideburn   Squamous cell carcinoma of skin 04/11/2018   in situ on right flank - tx p bx   Squamous cell carcinoma of skin 09/28/2018   in situ on right inner ear - tx p bx   Squamous cell carcinoma of skin 09/28/2018   in situ on left inner ear - tx p bx   Squamous cell carcinoma of skin 09/28/2018   in situ on right arm - tx p bx   Squamous cell carcinoma of skin 03/21/2019   in situ on right antihelix ([place] treated topically)   Squamous cell carcinoma of skin 03/21/2019   in situ on right neck - CX3+5FU   Squamous cell carcinoma of skin 03/21/2019   in situ on left outer eye, inf (MOHs done 05/23/2019)   Type 2 diabetes mellitus ([place])    Unspecified atrial fibrillation ([place]) 02/17/2020    Past Surgical History:  Procedure Laterality Date   CARDIOVERSION N/A 10/16/2020   Procedure: CARDIOVERSION;  Surgeon: Larey Dresser, MD;  Location: [facility] ENDOSCOPY;  Service: Cardiovascular;  Laterality: N/A;   KIDNEY TRANSPLANT Right    RIGHT/LEFT HEART CATH AND CORONARY ANGIOGRAPHY N/A 09/09/2020   Procedure: RIGHT/LEFT HEART CATH AND CORONARY ANGIOGRAPHY;  Surgeon: Larey Dresser, MD;  Location: [facility];  Service: Cardiovascular;  Laterality: N/A;     reports that he has been smoking pipe. He has never used smokeless tobacco. He reports that he does not drink alcohol and does not use drugs.  Allergies  Allergen Reactions   Other Rash and Palpitations    Use paper tape.  tachycardia   Tape Rash    Use paper tape.    Trazodone And Nefazodone Palpitations    tachycardia   Mirtazapine Other (See Comments)    imbalance   Elemental Sulfur Rash   Sulfa Antibiotics Rash    Family History  Problem Relation Age of Onset   Clotting disorder Mother    Hypertension Sister    Diabetes Sister     Prior to Admission medications    Medication Sig Start Date End Date Taking? Authorizing Provider  acetaminophen (TYLENOL) 500 MG tablet Take 500 mg by mouth every 8 (eight) hours as needed for moderate pain.    [provider]  albuterol (VENTOLIN HFA) 108 (90 Base) MCG/ACT inhaler Inhale 2 puffs into the lungs every 6 (six) hours as needed for wheezing or shortness of breath. 11/13/21   Claretta Fraise, MD  ALPRAZolam (  XANAX) 0.25 MG tablet Take 1 tablet (0.25 mg total) by mouth at bedtime as needed for anxiety. 12/28/21   Claretta Fraise, MD  amiodarone (PACERONE) 200 MG tablet Take 0.5 tablets (100 mg total) by mouth daily. 10/14/21   Larey Dresser, MD  amoxicillin-clavulanate (AUGMENTIN) 875-125 MG tablet Take 1 tablet by mouth 2 (two) times daily. Take all of this medication 02/14/22   Claretta Fraise, MD  atorvastatin (LIPITOR) 40 MG tablet Take 1 tablet (40 mg total) by mouth daily. 11/10/21   Claretta Fraise, MD  ELIQUIS 5 MG TABS tablet Take 1 tablet by mouth twice daily 11/09/21   Larey Dresser, MD  ezetimibe (ZETIA) 10 MG tablet Take 1 tablet (10 mg total) by mouth daily. 08/17/21   Satira Sark, MD  fluticasone (FLONASE) 50 MCG/ACT nasal spray Place 1 spray into both nostrils daily as needed for allergies. 12/21/21   Johnson, Clanford L, MD  insulin NPH Human (NOVOLIN N) 100 UNIT/ML injection 10 units AC breakfast and 10 units AC supper 08/05/21   Claretta Fraise, MD  insulin regular (NOVOLIN R) 100 units/mL injection 10 units w bkfst and 6 units w dinner 01/05/22   Domenic Polite, MD  loratadine (CLARITIN) 10 MG tablet Take 10 mg by mouth daily as needed for allergies. 10/24/06   [provider]  metoprolol succinate (TOPROL-XL) 25 MG 24 hr tablet Take 1 tablet (25 mg total) by mouth daily. Take with or immediately following a meal. 12/21/21   Johnson, Clanford L, MD  mycophenolate (CELLCEPT) 250 MG capsule Take 250 mg by mouth 2 (two) times daily.    [provider]  nitroGLYCERIN (NITROSTAT)  0.4 MG SL tablet Place 1 tablet (0.4 mg total) under the tongue every 5 (five) minutes as needed for chest pain. 11/05/20   Larey Dresser, MD  ondansetron (ZOFRAN) 4 MG tablet TAKE 1 TABLET BY MOUTH EVERY 8 HOURS AS NEEDED FOR NAUSEA FOR VOMITING 01/24/22   Claretta Fraise, MD  predniSONE (DELTASONE) 5 MG tablet Take 5 mg by mouth daily with breakfast.    [provider]  tacrolimus (PROGRAF) 0.5 MG capsule Take 1 mg by mouth in the morning and at bedtime. 07/08/15   [provider]  torsemide (DEMADEX) 20 MG tablet Take 2 tablets (40 mg total) by mouth daily. 01/26/22   Milford, Maricela Bo, FNP  Vericiguat (VERQUVO) 2.5 MG TABS Take 2.5 mg by mouth daily. 12/01/21   Larey Dresser, MD    Physical Exam: Vitals:   03/05/2022 1006 02/23/2022 1009 02/14/2022 1011 02/21/2022 1148  BP:   (!) 100/47 104/69  Pulse:   (!) 57 (!) 54  Resp:   (!) 22 18  Temp:   97.8 F (36.6 C)   TempSrc:   Oral   SpO2: 98%  98% 100%  Weight:  89.4 kg    Height:  '5\' 10"'$  (1.778 m)      Constitutional: NAD, calm, comfortable Vitals:   03/10/2022 1006 02/23/2022 1009 03/05/2022 1011 03/01/2022 1148  BP:   (!) 100/47 104/69  Pulse:   (!) 57 (!) 54  Resp:   (!) 22 18  Temp:   97.8 F (36.6 C)   TempSrc:   Oral   SpO2: 98%  98% 100%  Weight:  89.4 kg    Height:  '5\' 10"'$  (1.778 m)     Eyes: lids and conjunctivae normal Neck: normal, supple Respiratory: clear to auscultation bilaterally. Normal respiratory effort. No accessory muscle use.  Cardiovascular: Regular rate and rhythm, no murmurs. Abdomen: no tenderness, no distention. Bowel sounds positive.  Musculoskeletal:  No edema. Skin: no rashes, lesions, ulcers.  Psychiatric: Flat affect  Labs on Admission: I have personally reviewed following labs and imaging studies  CBC: Recent Labs  Lab 03/03/2022 1040  WBC 3.7*  NEUTROABS 2.5  HGB 5.5*  HCT 18.1*  MCV 92.3  PLT 99*   Basic Metabolic Panel: Recent Labs  Lab 02/21/2022 1040  NA 123*  K  5.4*  CL 95*  CO2 13*  GLUCOSE 210*  BUN 112*  CREATININE 4.93*  CALCIUM 6.9*   GFR: Estimated Creatinine Clearance: 14.6 mL/min (A) (by C-G formula based on SCr of 4.93 mg/dL (H)). Liver Function Tests: Recent Labs  Lab 02/19/2022 1040  AST 22  ALT 16  ALKPHOS 55  BILITOT 0.8  PROT 4.8*  ALBUMIN 2.6*   Recent Labs  Lab 03/10/2022 1040  LIPASE 22   No results for input(s): "AMMONIA" in the last 168 hours. Coagulation Profile: Recent Labs  Lab 02/23/2022 1231  INR 2.3*   Cardiac Enzymes: No results for input(s): "CKTOTAL", "CKMB", "CKMBINDEX", "TROPONINI" in the last 168 hours. BNP (last 3 results) No results for input(s): "PROBNP" in the last 8760 hours. HbA1C: No results for input(s): "HGBA1C" in the last 72 hours. CBG: No results for input(s): "GLUCAP" in the last 168 hours. Lipid Profile: No results for input(s): "CHOL", "HDL", "LDLCALC", "TRIG", "CHOLHDL", "LDLDIRECT" in the last 72 hours. Thyroid Function Tests: No results for input(s): "TSH", "T4TOTAL", "FREET4", "T3FREE", "THYROIDAB" in the last 72 hours. Anemia Panel: No results for input(s): "VITAMINB12", "FOLATE", "FERRITIN", "TIBC", "IRON", "RETICCTPCT" in the last 72 hours. Urine analysis:    Component Value Date/Time   COLORURINE YELLOW 12/18/2021 0748   APPEARANCEUR Cloudy (A) 02/10/2022 1136   LABSPEC 1.009 12/18/2021 0748   PHURINE 7.0 12/18/2021 0748   GLUCOSEU Negative 02/10/2022 1136   HGBUR MODERATE (A) 12/18/2021 0748   BILIRUBINUR Negative 02/10/2022 1136   KETONESUR NEGATIVE 12/18/2021 0748   PROTEINUR 2+ (A) 02/10/2022 1136   PROTEINUR 100 (A) 12/18/2021 0748   NITRITE Negative 02/10/2022 1136   NITRITE NEGATIVE 12/18/2021 0748   LEUKOCYTESUR 3+ (A) 02/10/2022 1136   LEUKOCYTESUR LARGE (A) 12/18/2021 0748    Radiological Exams on Admission: No results found.  EKG: Independently reviewed. SR 55bpm.  Assessment/Plan Principal Problem:   Acute blood loss anemia Active  Problems:   CAD (coronary artery disease)/Prior MI   Insulin dependent type 2 diabetes mellitus ([place])   Renal transplant recipient   Essential hypertension   Hyperlipidemia   Hyponatremia   Acute on chronic combined systolic and diastolic CHF (congestive heart failure) (HCC)   Thrombocytopenia (H[facility])   Cerebrovascular accident (CVA) ([place])   Immunosuppression ([place])   Goals of care, counseling/discussion   DNR (do not resuscitate)   Paroxysmal A-fib (H[facility])   Anemia in stage 4 chronic kidney disease (H[facility])    Acute on chronic blood loss anemia secondary to suspected GI bleed in the setting of Eliquis use Hemoglobin at baseline appears to be 9, currently 5.5 with 2 units PRBCs ordered Okay for clear liquid diet Hold Eliquis Protonix infusion Monitor H/H Appreciate GI evaluation  AKI on CKD stage IV/renal transplant Continue immunosuppressants Continue to follow labs with blood transfusion Strict I's and O's Avoid nephrotoxic agents Consider nephrology evaluation if creatinine does not trend in the right direction after transfusion Does not appear agreeable to starting hemodialysis again  Hyperkalemia secondary to above Administer  Lokelma  Hyponatremia Likely related to CHF/AKI  Recent UTI Check UA/urine culture  Thrombocytopenia Likely related to mycophenolate, monitor  Leukopenia Likely related to mycophenolate, monitor  Chronic combined CHF LVEF 25% Transfuse carefully with Lasix in between  Severe multivessel CAD Cath 8/22 and patient declined CABG, PCI felt to be of limited benefit  Paroxysmal atrial fibrillation Holding anticoagulation now due to ongoing GI bleed Continue amiodarone Monitor on telemetry Hold metoprolol with soft blood pressure readings Not interested in ablation  Type 2 diabetes with hyperglycemia SSI every 4 hours  Goals of care/general failure to thrive Palliative evaluation appreciated   DVT prophylaxis: SCDs Code Status:  DNR/DNI Family Communication: Daughter at bedside 1/15 Disposition Plan:Admit for transfusion and GI evaluation Consults called:GI, palliative Admission status:Inpatient, SDU   Severity of Illness: The appropriate patient status for this patient is INPATIENT. Inpatient status is judged to be reasonable and necessary in order to provide the required intensity of service to ensure the patient's safety. The patient's presenting symptoms, physical exam findings, and initial radiographic and laboratory data in the context of their chronic comorbidities is felt to place them at high risk for further clinical deterioration. Furthermore, it is not anticipated that the patient will be medically stable for discharge from the hospital within 2 midnights of admission.   * I certify that at the point of admission it is my clinical judgment that the patient will require inpatient hospital care spanning beyond 2 midnights from the point of admission due to high intensity of service, high risk for further deterioration and high frequency of surveillance required.*   Keia Rask D Geronimo Diliberto DO [facility]  If 7PM-7AM, please contact night-coverage [email or web address]  02/17/2022, 1:16 PM

## 2022-02-22 NOTE — Consult Note (Signed)
Gastroenterology Consult   Referring Provider: No ref. provider found Primary Care Physician:  Claretta Fraise, MD Primary Gastroenterologist:  previously unassigned  Patient ID: ADALID BECKMANN; [id number]; 1945-12-27   Admit date: 03/06/2022  LOS: 0 days   Date of Consultation: 02/27/2022  Reason for Consultation:  GI bleeding    History of Present Illness   Daniel Myers is a 77 y.o. 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 Patient notes bloody diarrhea the past 2-3 days. Started on Augmentin for UTI one week ago.   In the ED: Hgb 5.5, white blood cell count 3700, platelets 99,000, potassium 5.4, Creatinine 4.93 (up from 2.69 twelve days ago), INR 2.3, sodium 123.  Blood pressures low normal.   Labs from February 03, 2022: Hemoglobin 9.7, hematocrit 28.6, MCV 83.7, platelets 68,000. Labs from January 07, 2022: Transferrin 177, iron 60, TIBC 248, ferritin 246, iron saturation 24%, hemoglobin 11.2, white blood cell count 2500, platelets 95,000.   Patient received a unit of packed red blood cells November 9 during admission.  Today: patient and daughter provided history. For about one week he has been having N/V, diarrhea and diminished oral intake. Over the past few days, he has been unable to perform ADLs, has been immobile. Daughters began assisting with cleaning him up and notes dark blood in his stools. Stools black in the setting of Pepto use. He continues to complain of nausea. Denies any abdominal pain. No heartburn, dysphagia, hematemesis. Before onset of symptoms, stools normal. No ASA/NSAIDs.  No known prior EGD/colonoscopy.  Prior to Admission medications   Medication Sig Start Date End Date Taking? Authorizing Provider  acetaminophen (TYLENOL) 500 MG tablet Take 500 mg by mouth every 8 (eight) hours as needed for moderate pain.   Yes  [provider]  albuterol (VENTOLIN HFA) 108 (90 Base) MCG/ACT inhaler Inhale 2 puffs into the lungs every 6 (six) hours as needed for wheezing or shortness of breath. 11/13/21  Yes Stacks, [name], MD  ALPRAZolam Duanne Moron) 0.25 MG tablet Take 1 tablet (0.25 mg total) by mouth at bedtime as needed for anxiety. 12/28/21  Yes Claretta Fraise, MD  amiodarone (PACERONE) 200 MG tablet Take 0.5 tablets (100 mg total) by mouth daily. 10/14/21  Yes Larey Dresser, MD  amoxicillin-clavulanate (AUGMENTIN) 875-125 MG tablet Take 1 tablet by mouth 2 (two) times daily. Take all of this medication 02/14/22  Yes Stacks, [name], MD  atorvastatin (LIPITOR) 40 MG tablet Take 1 tablet (40 mg total) by mouth daily. 11/10/21  Yes Stacks, [name], MD  ELIQUIS 5 MG TABS tablet Take 1 tablet by mouth twice daily 11/09/21  Yes Larey Dresser, MD  ezetimibe (ZETIA) 10 MG tablet Take 1 tablet (10 mg total) by mouth daily. 08/17/21  Yes Satira Sark, MD  fluticasone [facility]) 50 MCG/ACT nasal spray Place 1 spray into both nostrils daily as needed for allergies. 12/21/21  Yes Johnson, Clanford L, MD  insulin NPH Human (NOVOLIN N) 100 UNIT/ML injection 10 units AC breakfast and 10 units AC supper 08/05/21  Yes Stacks, [name], MD  insulin regular (NOVOLIN R) 100 units/mL injection 10 units w bkfst and 6 units w dinner 01/05/22  Yes Domenic Polite, MD  loratadine (CLARITIN) 10 MG tablet Take 10 mg by mouth daily as needed for allergies. 10/24/06  Yes [provider]  metoprolol succinate (  TOPROL-XL) 25 MG 24 hr tablet Take 1 tablet (25 mg total) by mouth daily. Take with or immediately following a meal. 12/21/21  Yes Johnson, Clanford L, MD  mycophenolate (CELLCEPT) 250 MG capsule Take 250 mg by mouth 2 (two) times daily.   Yes [provider]  nitroGLYCERIN (NITROSTAT) 0.4 MG SL tablet Place 1 tablet (0.4 mg total) under the tongue every 5 (five) minutes as needed for chest pain. 11/05/20  Yes Larey Dresser, MD  ondansetron (ZOFRAN) 4 MG tablet TAKE 1 TABLET BY MOUTH EVERY 8 HOURS AS NEEDED FOR NAUSEA FOR VOMITING 01/24/22  Yes Stacks, [name], MD  predniSONE (DELTASONE) 5 MG tablet Take 5 mg by mouth daily with breakfast.   Yes [provider]  tacrolimus (PROGRAF) 0.5 MG capsule Take 1 mg by mouth in the morning and at bedtime. 07/08/15  Yes [provider]  torsemide (DEMADEX) 20 MG tablet Take 2 tablets (40 mg total) by mouth daily. 01/26/22  Yes Milford, Jessica M, FNP  Vericiguat (VERQUVO) 2.5 MG TABS Take 2.5 mg by mouth daily. 12/01/21  Yes Larey Dresser, MD    Current Facility-Administered Medications  Medication Dose Route Frequency Provider Last Rate Last Admin   0.9 %  sodium chloride infusion  250 mL Intravenous PRN Manuella Ghazi, Pratik D, DO       acetaminophen (TYLENOL) tablet 650 mg  650 mg Oral Q6H PRN Manuella Ghazi, Pratik D, DO       Or   acetaminophen (TYLENOL) suppository 650 mg  650 mg Rectal Q6H PRN Manuella Ghazi, Pratik D, DO       albuterol (PROVENTIL) (2.5 MG/3ML) 0.083% nebulizer solution 2.5 mg  2.5 mg Nebulization Q6H PRN Manuella Ghazi, Pratik D, DO       ALPRAZolam Duanne Moron) tablet 0.25 mg  0.25 mg Oral QHS PRN Manuella Ghazi, Pratik D, DO       amiodarone (PACERONE) tablet 100 mg  100 mg Oral Daily Shah, Pratik D, DO       atorvastatin (LIPITOR) tablet 40 mg  40 mg Oral Daily Shah, Pratik D, DO       ezetimibe (ZETIA) tablet 10 mg  10 mg Oral Daily Shah, Pratik D, DO       fluticasone (FLONASE) 50 MCG/ACT nasal spray 1 spray  1 spray Each Nare Daily PRN Manuella Ghazi, Pratik D, DO       insulin aspart (novoLOG) injection 0-15 Units  0-15 Units Subcutaneous Q4H Shah, Pratik D, DO       loratadine (CLARITIN) tablet 10 mg  10 mg Oral Daily PRN Manuella Ghazi, Pratik D, DO       mycophenolate (CELLCEPT) capsule 250 mg  250 mg Oral BID Manuella Ghazi, Pratik D, DO       ondansetron (ZOFRAN) tablet 4 mg  4 mg Oral Q6H PRN Manuella Ghazi, Pratik D, DO       Or   ondansetron (ZOFRAN) injection 4 mg  4 mg Intravenous Q6H PRN Manuella Ghazi,  Pratik D, DO       pantoprozole (PROTONIX) 80 mg /NS 100 mL infusion  8 mg/hr Intravenous Continuous Manuella Ghazi, Pratik D, DO       [START ON 02/23/2022] predniSONE (DELTASONE) tablet 5 mg  5 mg Oral Q breakfast Manuella Ghazi, Pratik D, DO       sodium chloride flush (NS) 0.9 % injection 3 mL  3 mL Intravenous Q12H Shah, Pratik D, DO   3 mL at 02/15/2022 1356   sodium chloride flush (NS) 0.9 % injection 3 mL  3 mL Intravenous PRN Manuella Ghazi, Pratik D, DO       sodium zirconium cyclosilicate (LOKELMA) packet 10 g  10 g Oral Once Manuella Ghazi, Pratik D, DO       tacrolimus (PROGRAF) capsule 0.5 mg  0.5 mg Oral BID Manuella Ghazi, Pratik D, DO       Vericiguat TABS 2.5 mg  2.5 mg Oral Daily Manuella Ghazi, Pratik D, DO       Current Outpatient Medications  Medication Sig Dispense Refill   acetaminophen (TYLENOL) 500 MG tablet Take 500 mg by mouth every 8 (eight) hours as needed for moderate pain.     albuterol (VENTOLIN HFA) 108 (90 Base) MCG/ACT inhaler Inhale 2 puffs into the lungs every 6 (six) hours as needed for wheezing or shortness of breath. 1 each 3   ALPRAZolam (XANAX) 0.25 MG tablet Take 1 tablet (0.25 mg total) by mouth at bedtime as needed for anxiety. 90 tablet 1   amiodarone (PACERONE) 200 MG tablet Take 0.5 tablets (100 mg total) by mouth daily. 45 tablet 3   amoxicillin-clavulanate (AUGMENTIN) 875-125 MG tablet Take 1 tablet by mouth 2 (two) times daily. Take all of this medication 20 tablet 0   atorvastatin (LIPITOR) 40 MG tablet Take 1 tablet (40 mg total) by mouth daily. 90 tablet 3   ELIQUIS 5 MG TABS tablet Take 1 tablet by mouth twice daily 60 tablet 11   ezetimibe (ZETIA) 10 MG tablet Take 1 tablet (10 mg total) by mouth daily. 90 tablet 3   fluticasone (FLONASE) 50 MCG/ACT nasal spray Place 1 spray into both nostrils daily as needed for allergies.     insulin NPH Human (NOVOLIN N) 100 UNIT/ML injection 10 units AC breakfast and 10 units AC supper 30 mL 11   insulin regular (NOVOLIN R) 100 units/mL injection 10 units w bkfst  and 6 units w dinner 10 mL 5   loratadine (CLARITIN) 10 MG tablet Take 10 mg by mouth daily as needed for allergies.     metoprolol succinate (TOPROL-XL) 25 MG 24 hr tablet Take 1 tablet (25 mg total) by mouth daily. Take with or immediately following a meal. 30 tablet 2   mycophenolate (CELLCEPT) 250 MG capsule Take 250 mg by mouth 2 (two) times daily.     nitroGLYCERIN (NITROSTAT) 0.4 MG SL tablet Place 1 tablet (0.4 mg total) under the tongue every 5 (five) minutes as needed for chest pain. 25 tablet 3   ondansetron (ZOFRAN) 4 MG tablet TAKE 1 TABLET BY MOUTH EVERY 8 HOURS AS NEEDED FOR NAUSEA FOR VOMITING 20 tablet 2   predniSONE (DELTASONE) 5 MG tablet Take 5 mg by mouth daily with breakfast.     tacrolimus (PROGRAF) 0.5 MG capsule Take 1 mg by mouth in the morning and at bedtime.     torsemide (DEMADEX) 20 MG tablet Take 2 tablets (40 mg total) by mouth daily. 180 tablet 3   Vericiguat (VERQUVO) 2.5 MG TABS Take 2.5 mg by mouth daily. 30 tablet 6    Allergies as of 03/01/2022 - Review Complete 03/03/2022  Allergen Reaction Noted   Other Rash and Palpitations 07/29/2014   Tape Rash 11/22/2018   Trazodone and nefazodone Palpitations 04/03/2020   Mirtazapine Other (See Comments) 04/03/2020   Elemental sulfur Rash 11/22/2018   Sulfa antibiotics Rash 10/29/2021    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   nodular on left jawline - excision   Basal cell carcinoma 07/01/2014   nodular  on left hawling - CX3+5FU+excision   Basal cell carcinoma 10/10/2014   nodular on right forearm, middle - tx p bx   Basal cell carcinoma 02/11/2015   left neck - CX3 + excision   Basal cell carcinoma 06/14/2016   left jawline - CX3+5FU   Basal cell carcinoma 02/22/2017   superficial and nodular on left neck - excision   Basal cell carcinoma 04/11/2018   superficial and nodular on right inferior forearm - CX3+Cautery+5FU   Chronic kidney disease    History of renal  transplant    Hyperlipidemia    Hypertension    NSTEMI (non-ST elevated myocardial infarction) ([place]) 02/13/2020   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   Left [place] Cheek (well diff)   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   Right Breast (in situ)   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   Left Forearm - anterior (mod diff)   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   Neck - anterior (mod diff)   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   in situ on left arm - CX3+5FU   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   hypertrophic on left ear - CX3+5FU   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   in situ on right temple - CX3+5FU   Squamous cell carcinoma of skin 11/08/2011   right upper forearm - tx p bx   Squamous cell carcinoma of skin 11/08/2011   left upper forearm - tx p bx   Squamous cell carcinoma of skin 11/08/2011   left hand - tx p bx   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/27/2013   in situ on left lower back - CX3+5FU   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/27/2013   in situ on left forehead - CX3+5FU   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/27/2013   in situ on right temple - watch per ST   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/27/2013   in situ on right forearm - CX3+5FU   Squamous cell carcinoma of skin 07/01/2014   well differentiated on right sideburn - CX3+5FU   Squamous cell carcinoma of skin 07/01/2014   in situ on left shoulder - CX3+5FU   Squamous cell carcinoma of skin 07/01/2014   in situ on right forearm, proximal - CX3+5FU+Cautery   Squamous cell carcinoma of skin 07/01/2014   in situ on right forearm, distal - CX3+5FU   Squamous cell carcinoma of skin 09/30/2015   in situ on posterior left ear - CX3+5FU   Squamous cell carcinoma of skin 02/22/2017   in situ on right upper arm - CX3+5FU   Squamous cell carcinoma of skin 02/22/2017   in situ on left upper arm - CX3+5FU   Squamous cell carcinoma of skin 04/11/2018    in situ on right temple - CX3+5FU   Squamous cell carcinoma of skin 04/11/2018   in situ on lateral right arm - MOHs   Squamous cell carcinoma of skin 04/11/2018   in situ on right upper arm - MOHs   Squamous cell carcinoma of skin 04/11/2018   in situ on left sideburn   Squamous cell carcinoma of skin 04/11/2018   in situ on right flank - tx p bx   Squamous cell carcinoma of skin 09/28/2018   in situ on right inner ear - tx p bx   Squamous cell carcinoma of skin 09/28/2018   in situ on left inner ear - tx p bx  Squamous cell carcinoma of skin 09/28/2018   in situ on right arm - tx p bx   Squamous cell carcinoma of skin 03/21/2019   in situ on right antihelix ([place]kov treated topically)   Squamous cell carcinoma of skin 03/21/2019   in situ on right neck - CX3+5FU   Squamous cell carcinoma of skin 03/21/2019   in situ on left outer eye, inf (MOHs done 05/23/2019)   Type 2 diabetes mellitus ([place])    Unspecified atrial fibrillation ([place]) 02/17/2020    Past Surgical History:  Procedure Laterality Date   CARDIOVERSION N/A 10/16/2020   Procedure: CARDIOVERSION;  Surgeon: Larey Dresser, MD;  Location: [place];  Service: Cardiovascular;  Laterality: N/A;   KIDNEY TRANSPLANT Right    RIGHT/LEFT HEART CATH AND CORONARY ANGIOGRAPHY N/A 09/09/2020   Procedure: RIGHT/LEFT HEART CATH AND CORONARY ANGIOGRAPHY;  Surgeon: Larey Dresser, MD;  Location: [facility];  Service: Cardiovascular;  Laterality: N/A;    Family History  Problem Relation Age of Onset   Clotting disorder Mother    Hypertension Sister    Diabetes Sister     Social History   Socioeconomic History   Marital status: Widowed    Spouse name: Not on file   Number of children: 3   Years of education: Not on file   Highest education level: Not on file  Occupational History   Not on file  Tobacco Use   Smoking status: Some Days    Types: Pipe   Smokeless tobacco: Never  Vaping Use   Vaping Use:  Never used  Substance and Sexual Activity   Alcohol use: Never   Drug use: Never   Sexual activity: Not Currently  Other Topics Concern   Not on file  Social History Narrative   Wife passed away in May 15, 2007.   2 daughters, 1 son. All live close by.    3 grandchildren.    Social Determinants of Health   Financial Resource Strain: Low Risk  (01/12/2022)   Overall Financial Resource Strain (CARDIA)    Difficulty of Paying Living Expenses: Not hard at all  Food Insecurity: No Food Insecurity (01/12/2022)   Hunger Vital Sign    Worried About Running Out of Food in the Last Year: Never true    Ran Out of Food in the Last Year: Never true  Transportation Needs: No Transportation Needs (01/12/2022)   PRAPARE - Hydrologist (Medical): No    Lack of Transportation (Non-Medical): No  Physical Activity: Insufficiently Active (04/15/2021)   Exercise Vital Sign    Days of Exercise per Week: 7 days    Minutes of Exercise per Session: 20 min  Stress: No Stress Concern Present (01/12/2022)   [place]    Feeling of Stress : Not at all  Social Connections: Moderately Integrated (01/12/2022)   Social Connection and Isolation Panel [NHANES]    Frequency of Communication with Friends and Family: More than three times a week    Frequency of Social Gatherings with Friends and Family: More than three times a week    Attends Religious Services: 1 to 4 times per year    Active Member of [employer] or Organizations: No    Attends Archivist Meetings: 1 to 4 times per year    Marital Status: Widowed  Intimate Partner Violence: Not At Risk (01/12/2022)   Humiliation, Afraid, Rape, and Kick questionnaire    Fear  of Current or Ex-Partner: No    Emotionally Abused: No    Physically Abused: No    Sexually Abused: No     Review of System:   General: Negative for weight loss, fever, chills, fatigue, +weakness.+poor  appetite Eyes: Negative for vision changes.  ENT: Negative for hoarseness, difficulty swallowing , nasal congestion. CV: Negative for chest pain, angina, palpitations, dyspnea on exertion, peripheral edema.  Respiratory: Negative for dyspnea at rest, dyspnea on exertion, cough, sputum, wheezing.  GI: See history of present illness. GU:  Negative for dysuria, hematuria, urinary incontinence, urinary frequency, nocturnal urination. Urine has been dark. MS: Negative for joint pain, low back pain.  Derm: Negative for rash or itching.  Neuro: Negative for weakness, abnormal sensation, seizure, frequent headaches, memory loss, confusion.  Psych: Negative for anxiety, depression, suicidal ideation, hallucinations.  Endo: Negative for unusual weight change.  Heme: Negative for bruising or bleeding. Allergy: Negative for rash or hives.      Physical Examination:   Vital signs in last 24 hours: Temp:  [97.8 F (36.6 C)] 97.8 F (36.6 C) (01/15 1011) Pulse Rate:  [54-57] 54 (01/15 1148) Resp:  [18-22] 18 (01/15 1148) BP: (100-104)/(47-69) 104/69 (01/15 1148) SpO2:  [98 %-100 %] 100 % (01/15 1148) Weight:  [89.4 kg] 89.4 kg (01/15 1009)    General: elderly male in NAD.  Head: Normocephalic, atraumatic.   Eyes: Conjunctiva pale, no icterus. Mouth: Oropharyngeal mucosa moist and pink , no lesions erythema or exudate. Neck: Supple without thyromegaly, masses, or lymphadenopathy.  Lungs: Clear to auscultation bilaterally.  Heart: Regular rate and rhythm, no murmurs rubs or gallops.  Abdomen: Bowel sounds are normal, nontender, nondistended, no hepatosplenomegaly or masses, no abdominal bruits or hernia , no rebound or guarding.   Rectal: not performed Extremities: 1+ pitting edema to knees bilaterally. No clubbing, deformity.  Neuro: Alert and oriented x 4 , grossly normal neurologically.  Skin: Warm and dry, no rash or jaundice.   Psych: Alert and cooperative, normal mood and affect.         Intake/Output from previous day: No intake/output data recorded. Intake/Output this shift: No intake/output data recorded.  Lab Results:   CBC Recent Labs    02/09/2022 1040  WBC 3.7*  HGB 5.5*  HCT 18.1*  MCV 92.3  PLT 99*   BMET Recent Labs    02/17/2022 1040  NA 123*  K 5.4*  CL 95*  CO2 13*  GLUCOSE 210*  BUN 112*  CREATININE 4.93*  CALCIUM 6.9*   LFT Recent Labs    02/15/2022 1040  BILITOT 0.8  ALKPHOS 55  AST 22  ALT 16  PROT 4.8*  ALBUMIN 2.6*    Lipase Recent Labs    03/06/2022 1040  LIPASE 22    PT/INR Recent Labs    02/21/2022 1231  LABPROT 24.9*  INR 2.3*     Hepatitis Panel No results for input(s): "HEPBSAG", "HCVAB", "HEPAIGM", "HEPBIGM" in the last 72 hours.   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 Patient notes bloody diarrhea the past 2-3 days. Started on Augmentin for UTI one week ago. GI consulted for GI bleeding.   GI bleed: in setting of Eliquis. Received one unit of prbcs in 12/2021. Recent onset N/V/D on Augment  for UTI. Stools black in setting of Pepto. Dark blood noted in stool for 3 days and while in ED. Per ED provider, stool loose maroon colored on DRE.Presenting now with Hgb of 5.5. Suspect lower GI bleed although cannot rule out upper GI source, difficult given recent Pepto use. He has significant comorbidities including combined CHF with LVEF 20%, recent NSTEMI, Afib on Eliquis, acute renal failure with remote kidney transplant. Discussed with Dr. Abbey Chatters who spoke with anesthesiologist, Dr. Briant Cedar. Patient would need to be transferred to [facility] if endoscopic evaluation planned.    Plan:   Gentle resuscitation. Goal of Hgb around 8 due to his heart disease, reduce demand ischemia.  Hold Eliquis. Last dose  was 02/21/22 at 9:30am PPI infusion.  He would benefit from endoscopic evaluation but anesthesiology advises this be done in [place] due to his cardiac disease.    LOS: 0 days   We would like to thank you for the opportunity to participate in the care of [employer].  Laureen Ochs. Bernarda Caffey [facility] [phone] 1/15/20241:57 PM

## 2022-02-23 ENCOUNTER — Encounter (HOSPITAL_COMMUNITY): Payer: Self-pay | Admitting: Internal Medicine

## 2022-02-23 ENCOUNTER — Encounter: Payer: Self-pay | Admitting: *Deleted

## 2022-02-23 ENCOUNTER — Encounter (HOSPITAL_COMMUNITY): Payer: Medicare Other

## 2022-02-23 DIAGNOSIS — Z7189 Other specified counseling: Secondary | ICD-10-CM

## 2022-02-23 DIAGNOSIS — D62 Acute posthemorrhagic anemia: Secondary | ICD-10-CM | POA: Diagnosis not present

## 2022-02-23 DIAGNOSIS — N184 Chronic kidney disease, stage 4 (severe): Secondary | ICD-10-CM

## 2022-02-23 DIAGNOSIS — D631 Anemia in chronic kidney disease: Secondary | ICD-10-CM

## 2022-02-23 DIAGNOSIS — Z515 Encounter for palliative care: Secondary | ICD-10-CM

## 2022-02-23 LAB — CBC
HCT: 25.4 % — ABNORMAL LOW (ref 39.0–52.0)
Hemoglobin: 7.9 g/dL — ABNORMAL LOW (ref 13.0–17.0)
MCH: 29 pg (ref 26.0–34.0)
MCHC: 31.1 g/dL (ref 30.0–36.0)
MCV: 93.4 fL (ref 80.0–100.0)
Platelets: 95 10*3/uL — ABNORMAL LOW (ref 150–400)
RBC: 2.72 MIL/uL — ABNORMAL LOW (ref 4.22–5.81)
RDW: 18.2 % — ABNORMAL HIGH (ref 11.5–15.5)
WBC: 4.5 10*3/uL (ref 4.0–10.5)
nRBC: 0 % (ref 0.0–0.2)

## 2022-02-23 LAB — HEMOGLOBIN AND HEMATOCRIT, BLOOD
HCT: 21.9 % — ABNORMAL LOW (ref 39.0–52.0)
HCT: 27.9 % — ABNORMAL LOW (ref 39.0–52.0)
Hemoglobin: 7 g/dL — ABNORMAL LOW (ref 13.0–17.0)
Hemoglobin: 9.4 g/dL — ABNORMAL LOW (ref 13.0–17.0)

## 2022-02-23 LAB — COMPREHENSIVE METABOLIC PANEL
ALT: 17 U/L (ref 0–44)
AST: 20 U/L (ref 15–41)
Albumin: 2.5 g/dL — ABNORMAL LOW (ref 3.5–5.0)
Alkaline Phosphatase: 56 U/L (ref 38–126)
Anion gap: 12 (ref 5–15)
BUN: 112 mg/dL — ABNORMAL HIGH (ref 8–23)
CO2: 17 mmol/L — ABNORMAL LOW (ref 22–32)
Calcium: 7 mg/dL — ABNORMAL LOW (ref 8.9–10.3)
Chloride: 99 mmol/L (ref 98–111)
Creatinine, Ser: 4.95 mg/dL — ABNORMAL HIGH (ref 0.61–1.24)
GFR, Estimated: 12 mL/min — ABNORMAL LOW (ref >60)
Glucose, Bld: 106 mg/dL — ABNORMAL HIGH (ref 70–99)
Potassium: 5.1 mmol/L (ref 3.5–5.1)
Sodium: 128 mmol/L — ABNORMAL LOW (ref 135–145)
Total Bilirubin: 1.2 mg/dL (ref 0.3–1.2)
Total Protein: 4.7 g/dL — ABNORMAL LOW (ref 6.5–8.1)

## 2022-02-23 LAB — CBG MONITORING, ED
Glucose-Capillary: 115 mg/dL — ABNORMAL HIGH (ref 70–99)
Glucose-Capillary: 103 mg/dL — ABNORMAL HIGH (ref 70–99)
Glucose-Capillary: 184 mg/dL — ABNORMAL HIGH (ref 70–99)
Glucose-Capillary: 220 mg/dL — ABNORMAL HIGH (ref 70–99)

## 2022-02-23 LAB — GLUCOSE, CAPILLARY
Glucose-Capillary: 127 mg/dL — ABNORMAL HIGH (ref 70–99)
Glucose-Capillary: 137 mg/dL — ABNORMAL HIGH (ref 70–99)
Glucose-Capillary: 193 mg/dL — ABNORMAL HIGH (ref 70–99)

## 2022-02-23 LAB — MRSA NEXT GEN BY PCR, NASAL: MRSA by PCR Next Gen: DETECTED — AB

## 2022-02-23 LAB — PREPARE RBC (CROSSMATCH)

## 2022-02-23 LAB — MAGNESIUM: Magnesium: 2.6 mg/dL — ABNORMAL HIGH (ref 1.7–2.4)

## 2022-02-23 LAB — OSMOLALITY, URINE: Osmolality, Ur: 318 mOsm/kg (ref 300–900)

## 2022-02-23 LAB — OSMOLALITY: Osmolality: 308 mOsm/kg — ABNORMAL HIGH (ref 275–295)

## 2022-02-23 LAB — T4, FREE: Free T4: 1.18 ng/dL — ABNORMAL HIGH (ref 0.61–1.12)

## 2022-02-23 MED ORDER — MUPIROCIN 2 % EX OINT
TOPICAL_OINTMENT | Freq: Two times a day (BID) | CUTANEOUS | Status: DC
  Filled 2022-02-23 (×2): qty 22

## 2022-02-23 MED ORDER — SODIUM CHLORIDE 0.9% IV SOLUTION: Freq: Once | INTRAVENOUS | Status: DC

## 2022-02-23 NOTE — Progress Notes (Addendum)
PROGRESS NOTE    Daniel Myers  JJH:[id number] DOB: 1945/11/17 DOA: 02/17/2022 PCP: Claretta Fraise, MD   Brief Narrative:    Daniel Myers is a 77 y.o. 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  He has had significant weakness in the last few days.  He was apparently started on Augmentin for a  UTI about a week ago and it is thought that his symptoms are related to the antibiotic he was started on.   Patient was admitted for acute on chronic blood loss anemia with hemoglobin 5.5 and has undergone 2 unit PRBC transfusion on 1/15.  He is also noted to have significant AKI on CKD stage IV.  Undergoing another 2 unit PRBC transfusion 1/6.  Assessment & Plan:   Principal Problem:   Acute blood loss anemia Active Problems:   CAD (coronary artery disease)/Prior MI   Insulin dependent type 2 diabetes mellitus ([place])   Renal transplant recipient   Essential hypertension   Hyperlipidemia   Hyponatremia   Acute on chronic combined systolic and diastolic CHF (congestive heart failure) (HCC)   Thrombocytopenia (HCC)   Cerebrovascular accident (CVA) ([place])   Immunosuppression ([place])   Goals of care, counseling/discussion   DNR (do not resuscitate)   Paroxysmal A-fib (HCC)   Anemia in stage 4 chronic kidney disease (HCC)   Gastrointestinal hemorrhage  Assessment and Plan:  Acute on chronic blood loss anemia secondary to suspected GI bleed in the setting of Eliquis use Hemoglobin at baseline appears to be 9, currently 7.9 after 2 units PRBCs ordered Okay for clear liquid diet Hold Eliquis Protonix infusion to continue Monitor CBC Appreciate GI evaluation with recommendation to transfer to [facility] for endoscopy Repeat  hemoglobin at 7 with ongoing GI bleed.  Transfuse another 2 units PRBC 1/16   AKI on CKD stage IV/renal transplant Continue immunosuppressants Continue to follow labs with blood transfusion Strict I's and O's Avoid nephrotoxic agents Does not appear agreeable to starting hemodialysis again Appreciate nephrology evaluation   Hyperkalemia currently resolved with Lokelma Continue to monitor   Hyponatremia-improving Patient appears hyperosmolar and with some component of SIADH Fluid restrict on clear liquid diet TSH 8.643, check free T4   Recent UTI Urine analysis significant for pyuria, but no significant findings of infection noted   Thrombocytopenia-stable Likely related to mycophenolate, monitor   Leukopenia-stable Likely related to mycophenolate, monitor   Chronic combined CHF LVEF 25% Transfuse carefully with Lasix in between   Severe multivessel CAD Cath 8/22 and patient declined CABG, PCI felt to be of limited benefit   Paroxysmal atrial fibrillation Holding anticoagulation now due to ongoing GI bleed Continue amiodarone Monitor on telemetry Hold metoprolol with soft blood pressure readings Not interested in ablation   Type 2 diabetes with hyperglycemia-improved SSI every 4 hours for now   Goals of care/general failure to thrive Palliative evaluation appreciated   DVT prophylaxis: SCDs Code Status: DNR Family Communication: Daughter at bedside 1/15 Disposition Plan:  Status is: Inpatient Remains inpatient appropriate because: Need for IV medications.  Consultants:  GI Nephrology Palliative care  Procedures:  None  Antimicrobials:  None   Subjective: Patient seen and evaluated today with no new acute complaints or concerns. No acute concerns or events noted overnight.  Noted  to have dark bowel movement again this morning.  Objective: Vitals:   02/23/22 0600 02/23/22 0630 02/23/22 0730 02/23/22 0736  BP: (!) 111/53 (!) 107/55 (!) 114/55    Pulse: 62  63 63  Resp: '14 18 18 16  '$ Temp:    97.7 F (36.5 C)  TempSrc:    Oral  SpO2: 96%  99% 100%  Weight:      Height:        Intake/Output Summary (Last 24 hours) at 02/23/2022 0741 Last data filed at 02/23/2022 3825 Gross per 24 hour  Intake 758.73 ml  Output --  Net 758.73 ml   Filed Weights   02/18/2022 1009  Weight: 89.4 kg    Examination:  General exam: Appears calm and comfortable  Respiratory system: Clear to auscultation. Respiratory effort normal. Cardiovascular system: S1 & S2 heard, RRR.  Gastrointestinal system: Abdomen is soft Central nervous system: Alert and awake Extremities: No edema Skin: No significant lesions noted Psychiatry: Flat affect.    Data Reviewed: I have personally reviewed following labs and imaging studies  CBC: Recent Labs  Lab 03/03/2022 1040 02/23/22 0617  WBC 3.7* 4.5  NEUTROABS 2.5  --   HGB 5.5* 7.9*  HCT 18.1* 25.4*  MCV 92.3 93.4  PLT 99* 95*   Basic Metabolic Panel: Recent Labs  Lab 03/09/2022 1040 02/23/22 0617  NA 123* 128*  K 5.4* 5.1  CL 95* 99  CO2 13* 17*  GLUCOSE 210* 106*  BUN 112* 112*  CREATININE 4.93* 4.95*  CALCIUM 6.9* 7.0*  MG  --  2.6*   GFR: Estimated Creatinine Clearance: 14.5 mL/min (A) (by C-G formula based on SCr of 4.95 mg/dL (H)). Liver Function Tests: Recent Labs  Lab 03/05/2022 1040 02/23/22 0617  AST 22 20  ALT 16 17  ALKPHOS 55 56  BILITOT 0.8 1.2  PROT 4.8* 4.7*  ALBUMIN 2.6* 2.5*   Recent Labs  Lab 02/08/2022 1040  LIPASE 22   No results for input(s): "AMMONIA" in the last 168 hours. Coagulation Profile: Recent Labs  Lab 02/13/2022 1231  INR 2.3*   Cardiac Enzymes: No results for input(s): "CKTOTAL", "CKMB", "CKMBINDEX", "TROPONINI" in the last 168 hours. BNP (last 3 results) No results for input(s): "PROBNP" in the last 8760 hours. HbA1C: No results for input(s): "HGBA1C" in the last 72 hours. CBG: Recent Labs  Lab 03/01/2022 1627 02/25/2022 2019  02/21/2022 2354 02/23/22 0337  GLUCAP 194* 172* 149* 115*   Lipid Profile: No results for input(s): "CHOL", "HDL", "LDLCALC", "TRIG", "CHOLHDL", "LDLDIRECT" in the last 72 hours. Thyroid Function Tests: Recent Labs    03/09/2022 1053  TSH 8.643*   Anemia Panel: No results for input(s): "VITAMINB12", "FOLATE", "FERRITIN", "TIBC", "IRON", "RETICCTPCT" in the last 72 hours. Sepsis Labs: No results for input(s): "PROCALCITON", "LATICACIDVEN" in the last 168 hours.  Recent Results (from the past 240 hour(s))  MRSA Next Gen by PCR, Nasal     Status: Abnormal   Collection Time: 02/23/22  1:02 AM   Specimen: Nasal Mucosa; Nasal Swab  Result Value Ref Range Status   MRSA by PCR Next Gen DETECTED (A) NOT DETECTED Final    Comment: RESULT CALLED TO, READ BACK BY AND VERIFIED WITH: M.[email or web address] AT 0231 ON 01.16.24 BY ADGER J         The GeneXpert MRSA Assay (FDA approved for NASAL specimens only), is one component of a comprehensive MRSA colonization surveillance program. It is not intended to diagnose MRSA infection nor to guide or  monitor treatment for MRSA infections. Performed at [facility], [address] [place], [place] [address]          Radiology Studies: No results found.      Scheduled Meds:  amiodarone  100 mg Oral Daily   atorvastatin  40 mg Oral Daily   ezetimibe  10 mg Oral Daily   insulin aspart  0-15 Units Subcutaneous Q4H   mupirocin ointment   Nasal BID   mycophenolate  250 mg Oral BID   predniSONE  5 mg Oral Q breakfast   sodium chloride flush  3 mL Intravenous Q12H   tacrolimus  0.5 mg Oral BID   Vericiguat  2.5 mg Oral Daily   Continuous Infusions:  sodium chloride     pantoprazole 8 mg/hr (02/23/22 0642)     LOS: 1 day    Time spent: 35 minutes    Kimiya Brunelle Darleen Crocker, DO [facility]  If 7PM-7AM, please contact night-coverage [email or web address] 02/23/2022, 7:41 AM

## 2022-02-23 NOTE — Progress Notes (Signed)
Patient arrived to unit from [facility] via [facility] with second unit of blood being transfuse. Vitals stable,had a bloody stool ,clean,oriented to room, and made comfortable.

## 2022-02-23 NOTE — ED Notes (Signed)
Full bed linen changed and purewick changed at this time. Patient repositioned in bed.

## 2022-02-23 NOTE — ED Notes (Signed)
Admitting MD at bedside.

## 2022-02-23 NOTE — ED Notes (Signed)
ED TO INPATIENT HANDOFF REPORT  ED Nurse Name and Phone #: Joellen Jersey [phone]  S Name/Age/Gender Daniel Myers 77 y.o. male Room/Bed: APA12/APA12  Code Status   Code Status: DNR  Home/SNF/Other Home Patient oriented to: self, place, time, and situation Is this baseline? Yes   Triage Complete: Triage complete  Chief Complaint Acute blood loss anemia [D62]  Triage Note Pt bib ems from home c/o n/v/d x 1 week after starting abx for UTI. AO x 4.  91/52 HR 65 SPO2 98% CBG 269 NS 500, Zofran '4mg'$  IV given by EMS   Allergies Allergies  Allergen Reactions   Other Rash and Palpitations    Use paper tape.  tachycardia   Tape Rash    Use paper tape.    Trazodone And Nefazodone Palpitations    tachycardia   Mirtazapine Other (See Comments)    imbalance   Elemental Sulfur Rash   Sulfa Antibiotics Rash    Level of Care/Admitting Diagnosis ED Disposition     ED Disposition  Admit   Condition  --   Comment  Hospital Area: [facility] [[id number]]  Level of Care: Progressive [102]  Admit to Progressive based on following criteria: GI, ENDOCRINE disease patients with GI bleeding, acute liver failure or pancreatitis, stable with diabetic ketoacidosis or thyrotoxicosis (hypothyroid) state.  May admit patient to [facility] if equivalent level of care is available:: No  Covid Evaluation: Asymptomatic - no recent exposure (last 10 days) testing not required  Diagnosis: Acute blood loss anemia [778242]  Admitting Physician: Rodena Goldmann [[id number]]  Attending Physician: Rodena Goldmann [[id number]]  Certification:: I certify this patient will need inpatient services for at least 2 midnights  Estimated Length of Stay: 3          B Medical/Surgery History 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   nodular on left jawline - excision   Basal cell carcinoma 07/01/2014   nodular on left hawling - CX3+5FU+excision    Basal cell carcinoma 10/10/2014   nodular on right forearm, middle - tx p bx   Basal cell carcinoma 02/11/2015   left neck - CX3 + excision   Basal cell carcinoma 06/14/2016   left jawline - CX3+5FU   Basal cell carcinoma 02/22/2017   superficial and nodular on left neck - excision   Basal cell carcinoma 04/11/2018   superficial and nodular on right inferior forearm - CX3+Cautery+5FU   Chronic kidney disease    History of renal transplant    Hyperlipidemia    Hypertension    NSTEMI (non-ST elevated myocardial infarction) ([place]) 02/13/2020   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   Left [place] Cheek (well diff)   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   Right Breast (in situ)   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   Left Forearm - anterior (mod diff)   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   Neck - anterior (mod diff)   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   in situ on left arm - CX3+5FU   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   hypertrophic on left ear - CX3+5FU   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   in situ on right temple - CX3+5FU   Squamous cell carcinoma of skin 11/08/2011   right upper forearm - tx p bx   Squamous cell carcinoma of skin 11/08/2011   left upper forearm -  tx p bx   Squamous cell carcinoma of skin 11/08/2011   left hand - tx p bx   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/27/2013   in situ on left lower back - CX3+5FU   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/27/2013   in situ on left forehead - CX3+5FU   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/27/2013   in situ on right temple - watch per ST   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/27/2013   in situ on right forearm - CX3+5FU   Squamous cell carcinoma of skin 07/01/2014   well differentiated on right sideburn - CX3+5FU   Squamous cell carcinoma of skin 07/01/2014   in situ on left shoulder - CX3+5FU   Squamous cell carcinoma of skin 07/01/2014   in situ  on right forearm, proximal - CX3+5FU+Cautery   Squamous cell carcinoma of skin 07/01/2014   in situ on right forearm, distal - CX3+5FU   Squamous cell carcinoma of skin 09/30/2015   in situ on posterior left ear - CX3+5FU   Squamous cell carcinoma of skin 02/22/2017   in situ on right upper arm - CX3+5FU   Squamous cell carcinoma of skin 02/22/2017   in situ on left upper arm - CX3+5FU   Squamous cell carcinoma of skin 04/11/2018   in situ on right temple - CX3+5FU   Squamous cell carcinoma of skin 04/11/2018   in situ on lateral right arm - MOHs   Squamous cell carcinoma of skin 04/11/2018   in situ on right upper arm - MOHs   Squamous cell carcinoma of skin 04/11/2018   in situ on left sideburn   Squamous cell carcinoma of skin 04/11/2018   in situ on right flank - tx p bx   Squamous cell carcinoma of skin 09/28/2018   in situ on right inner ear - tx p bx   Squamous cell carcinoma of skin 09/28/2018   in situ on left inner ear - tx p bx   Squamous cell carcinoma of skin 09/28/2018   in situ on right arm - tx p bx   Squamous cell carcinoma of skin 03/21/2019   in situ on right antihelix (Mitkov treated topically)   Squamous cell carcinoma of skin 03/21/2019   in situ on right neck - CX3+5FU   Squamous cell carcinoma of skin 03/21/2019   in situ on left outer eye, inf (MOHs done 05/23/2019)   Type 2 diabetes mellitus ([place])    Unspecified atrial fibrillation ([place]) 02/17/2020   Past Surgical History:  Procedure Laterality Date   CARDIOVERSION N/A 10/16/2020   Procedure: CARDIOVERSION;  Surgeon: Larey Dresser, MD;  Location: [facility] ENDOSCOPY;  Service: Cardiovascular;  Laterality: N/A;   KIDNEY TRANSPLANT Right    RIGHT/LEFT HEART CATH AND CORONARY ANGIOGRAPHY N/A 09/09/2020   Procedure: RIGHT/LEFT HEART CATH AND CORONARY ANGIOGRAPHY;  Surgeon: Larey Dresser, MD;  Location: [facility] CV LAB;  Service: Cardiovascular;  Laterality: N/A;     A IV Location/Drains/Wounds Patient  Lines/Drains/Airways Status     Active Line/Drains/Airways     Name Placement date Placement time Site Days   Peripheral IV 02/09/2022 18 G Right Forearm 02/17/2022  --  Forearm  1   Peripheral IV 03/02/2022 18 G Right Antecubital 03/08/2022  --  Antecubital  1            Intake/Output Last 24 hours  Intake/Output Summary (Last 24 hours) at 02/23/2022 1447 Last data filed at 02/23/2022 9562 Gross per  24 hour  Intake 758.73 ml  Output --  Net 758.73 ml    Labs/Imaging Results for orders placed or performed during the hospital encounter of 02/10/2022 (from the past 48 hour(s))  CBC with Differential     Status: Abnormal   Collection Time: 02/17/2022 10:40 AM  Result Value Ref Range   WBC 3.7 (L) 4.0 - 10.5 K/uL   RBC 1.96 (L) 4.22 - 5.81 MIL/uL   Hemoglobin 5.5 (LL) 13.0 - 17.0 g/dL    Comment: This critical result has verified and been called to M. CUGINO by Joaquin Courts on 01 15 2024 at 1111, and has been read back.    HCT 18.1 (L) 39.0 - 52.0 %   MCV 92.3 80.0 - 100.0 fL   MCH 28.1 26.0 - 34.0 pg   MCHC 30.4 30.0 - 36.0 g/dL   RDW 18.5 (H) 11.5 - 15.5 %   Platelets 99 (L) 150 - 400 K/uL    Comment: SPECIMEN CHECKED FOR CLOTS Immature Platelet Fraction may be clinically indicated, consider ordering this additional test [id number] CONSISTENT WITH PREVIOUS RESULT    nRBC 0.0 0.0 - 0.2 %   Neutrophils Relative % 67 %   Neutro Abs 2.5 1.7 - 7.7 K/uL   Lymphocytes Relative 15 %   Lymphs Abs 0.6 (L) 0.7 - 4.0 K/uL   Monocytes Relative 8 %   Monocytes Absolute 0.3 0.1 - 1.0 K/uL   Eosinophils Relative 3 %   Eosinophils Absolute 0.1 0.0 - 0.5 K/uL   Basophils Relative 0 %   Basophils Absolute 0.0 0.0 - 0.1 K/uL   Smear Review PLATELET COUNT CONFIRMED BY SMEAR    Immature Granulocytes 7 %   Abs Immature Granulocytes 0.27 (H) 0.00 - 0.07 K/uL   Dimorphism PRESENT    Polychromasia PRESENT    Ovalocytes PRESENT     Comment: Performed at [facility], [address],  [place], [place] [address]  Comprehensive metabolic panel     Status: Abnormal   Collection Time: 02/18/2022 10:40 AM  Result Value Ref Range   Sodium 123 (L) 135 - 145 mmol/L   Potassium 5.4 (H) 3.5 - 5.1 mmol/L   Chloride 95 (L) 98 - 111 mmol/L   CO2 13 (L) 22 - 32 mmol/L   Glucose, Bld 210 (H) 70 - 99 mg/dL    Comment: Glucose reference range applies only to samples taken after fasting for at least 8 hours.   BUN 112 (H) 8 - 23 mg/dL    Comment: RESULTS CONFIRMED BY MANUAL DILUTION   Creatinine, Ser 4.93 (H) 0.61 - 1.24 mg/dL   Calcium 6.9 (L) 8.9 - 10.3 mg/dL   Total Protein 4.8 (L) 6.5 - 8.1 g/dL   Albumin 2.6 (L) 3.5 - 5.0 g/dL   AST 22 15 - 41 U/L   ALT 16 0 - 44 U/L   Alkaline Phosphatase 55 38 - 126 U/L   Total Bilirubin 0.8 0.3 - 1.2 mg/dL   GFR, Estimated 12 (L) >60 mL/min    Comment: (NOTE) Calculated using the CKD-EPI Creatinine Equation (2021)    Anion gap 15 5 - 15    Comment: Performed at [facility], [address], [place], [place] [address]  Lipase, blood     Status: None   Collection Time: 02/25/2022 10:40 AM  Result Value Ref Range   Lipase 22 11 - 51 U/L    Comment: Performed at [facility], [address] [place], [place] [address]  TSH     Status: Abnormal   Collection Time: 03/02/2022 10:53 AM  Result Value Ref Range   TSH 8.643 (H) 0.350 - 4.500 uIU/mL    Comment: Performed by a 3rd Generation assay with a functional sensitivity of <=0.01 uIU/mL. Performed at [facility], [address] [place], [place] [address]   Osmolality     Status: Abnormal   Collection Time: 02/21/2022 10:53 AM  Result Value Ref Range   Osmolality 308 (H) 275 - 295 mOsm/kg    Comment: REPEATED TO VERIFY Performed at [facility], [address] [place], [place] [address]   Type and screen [facility]     Status: None (Preliminary result)   Collection Time: 03/09/2022 12:30 PM  Result Value Ref Range   ABO/RH(D) A POS    Antibody Screen NEG    Sample Expiration  02/25/2022,2359    Unit Number [id number]    Blood Component Type RED CELLS,LR    Unit division 00    Status of Unit ISSUED,FINAL    Transfusion Status OK TO TRANSFUSE    Crossmatch Result Compatible    Unit Number [id number]    Blood Component Type RED CELLS,LR    Unit division 00    Status of Unit ALLOCATED    Transfusion Status OK TO TRANSFUSE    Crossmatch Result Compatible    Unit Number [id number]    Blood Component Type RBC LR PHER2    Unit division 00    Status of Unit ALLOCATED    Transfusion Status OK TO TRANSFUSE    Crossmatch Result Compatible    Unit Number [id number]    Blood Component Type RBC LR PHER1    Unit division 00    Status of Unit ISSUED    Transfusion Status OK TO TRANSFUSE    Crossmatch Result      Compatible Performed at [facility], [address] [place], [place] [address]   Prepare RBC (crossmatch)     Status: None   Collection Time: 02/28/2022 12:31 PM  Result Value Ref Range   Order Confirmation      ORDER PROCESSED BY BLOOD BANK Performed at [facility], [address], [place], [place] [address]   Protime-INR     Status: Abnormal   Collection Time: 02/08/2022 12:31 PM  Result Value Ref Range   Prothrombin Time 24.9 (H) 11.4 - 15.2 seconds   INR 2.3 (H) 0.8 - 1.2    Comment: (NOTE) INR goal varies based on device and disease states. Performed at [facility], [address] [place],  [address]   Urinalysis, Routine w reflex microscopic     Status: Abnormal   Collection Time: 02/25/2022  3:00 PM  Result Value Ref Range   Color, Urine YELLOW YELLOW   APPearance HAZY (A) CLEAR   Specific Gravity, Urine 1.013 1.005 - 1.030   pH 5.0 5.0 - 8.0   Glucose, UA 50 (A) NEGATIVE mg/dL   Hgb urine dipstick NEGATIVE NEGATIVE   Bilirubin Urine NEGATIVE NEGATIVE   Ketones, ur NEGATIVE NEGATIVE mg/dL   Protein, ur 100 (A) NEGATIVE mg/dL   Nitrite NEGATIVE NEGATIVE   Leukocytes,Ua MODERATE (A) NEGATIVE   RBC / HPF 0-5 0 - 5  RBC/hpf   WBC, UA >50 (H) 0 - 5 WBC/hpf   Bacteria, UA NONE SEEN NONE SEEN   Squamous Epithelial / HPF 0-5 0 - 5 /HPF   Mucus PRESENT    Amorphous Crystal PRESENT  Comment: Performed at [facility], [address], [place], [place] [address]  Osmolality, urine     Status: None   Collection Time: 03/07/2022  3:00 PM  Result Value Ref Range   Osmolality, Ur 318 300 - 900 mOsm/kg    Comment: REPEATED TO VERIFY Performed at [facility], [address] [place], [place] [address]   CBG monitoring, ED     Status: Abnormal   Collection Time: 03/01/2022  4:27 PM  Result Value Ref Range   Glucose-Capillary 194 (H) 70 - 99 mg/dL    Comment: Glucose reference range applies only to samples taken after fasting for at least 8 hours.  CBG monitoring, ED     Status: Abnormal   Collection Time: 03/08/2022  8:19 PM  Result Value Ref Range   Glucose-Capillary 172 (H) 70 - 99 mg/dL    Comment: Glucose reference range applies only to samples taken after fasting for at least 8 hours.  CBG monitoring, ED     Status: Abnormal   Collection Time: 02/23/2022 11:54 PM  Result Value Ref Range   Glucose-Capillary 149 (H) 70 - 99 mg/dL    Comment: Glucose reference range applies only to samples taken after fasting for at least 8 hours.  MRSA Next Gen by PCR, Nasal     Status: Abnormal   Collection Time: 02/23/22  1:02 AM   Specimen: Nasal Mucosa; Nasal Swab  Result Value Ref Range   MRSA by PCR Next Gen DETECTED (A) NOT DETECTED    Comment: RESULT CALLED TO, READ BACK BY AND VERIFIED WITH: M.MOSTELLER AT 0231 ON 01.16.24 BY ADGER J         The GeneXpert MRSA Assay (FDA approved for NASAL specimens only), is one component of a comprehensive MRSA colonization surveillance program. It is not intended to diagnose MRSA infection nor to guide or monitor treatment for MRSA infections. Performed at [facility], [address] [place], [place] [address]   CBG monitoring, ED     Status: Abnormal    Collection Time: 02/23/22  3:37 AM  Result Value Ref Range   Glucose-Capillary 115 (H) 70 - 99 mg/dL    Comment: Glucose reference range applies only to samples taken after fasting for at least 8 hours.  Magnesium     Status: Abnormal   Collection Time: 02/23/22  6:17 AM  Result Value Ref Range   Magnesium 2.6 (H) 1.7 - 2.4 mg/dL    Comment: Performed at [facility] [place] [facility], [address] [place], [place] [address]  Comprehensive metabolic panel     Status: Abnormal   Collection Time: 02/23/22  6:17 AM  Result Value Ref Range   Sodium 128 (L) 135 - 145 mmol/L   Potassium 5.1 3.5 - 5.1 mmol/L   Chloride 99 98 - 111 mmol/L   CO2 17 (L) 22 - 32 mmol/L   Glucose, Bld 106 (H) 70 - 99 mg/dL    Comment: Glucose reference range applies only to samples taken after fasting for at least 8 hours.   BUN 112 (H) 8 - 23 mg/dL    Comment: RESULTS CONFIRMED BY MANUAL DILUTION   Creatinine, Ser 4.95 (H) 0.61 - 1.24 mg/dL   Calcium 7.0 (L) 8.9 - 10.3 mg/dL   Total Protein 4.7 (L) 6.5 - 8.1 g/dL   Albumin 2.5 (L) 3.5 - 5.0 g/dL   AST 20 15 - 41 U/L   ALT 17 0 - 44 U/L   Alkaline Phosphatase 56 38 - 126  U/L   Total Bilirubin 1.2 0.3 - 1.2 mg/dL   GFR, Estimated 12 (L) >60 mL/min    Comment: (NOTE) Calculated using the CKD-EPI Creatinine Equation (2021)    Anion gap 12 5 - 15    Comment: Performed at [facility], [address], [place], [place] [address]  CBC     Status: Abnormal   Collection Time: 02/23/22  6:17 AM  Result Value Ref Range   WBC 4.5 4.0 - 10.5 K/uL   RBC 2.72 (L) 4.22 - 5.81 MIL/uL   Hemoglobin 7.9 (L) 13.0 - 17.0 g/dL    Comment: POST TRANSFUSION SPECIMEN DELTA CHECK NOTED    HCT 25.4 (L) 39.0 - 52.0 %   MCV 93.4 80.0 - 100.0 fL   MCH 29.0 26.0 - 34.0 pg   MCHC 31.1 30.0 - 36.0 g/dL   RDW 18.2 (H) 11.5 - 15.5 %   Platelets 95 (L) 150 - 400 K/uL    Comment: Immature Platelet Fraction may be clinically indicated, consider ordering this additional test [id number]    nRBC 0.0  0.0 - 0.2 %    Comment: Performed at [facility], [address] [place], [place] [address]  T4, free     Status: Abnormal   Collection Time: 02/23/22  6:33 AM  Result Value Ref Range   Free T4 1.18 (H) 0.61 - 1.12 ng/dL    Comment: (NOTE) Biotin ingestion may interfere with free T4 tests. If the results are inconsistent with the TSH level, previous test results, or the clinical presentation, then consider biotin interference. If needed, order repeat testing after stopping biotin. Performed at [facility], [place] [address], [place], [place] [address]   CBG monitoring, ED     Status: Abnormal   Collection Time: 02/23/22  8:02 AM  Result Value Ref Range   Glucose-Capillary 103 (H) 70 - 99 mg/dL    Comment: Glucose reference range applies only to samples taken after fasting for at least 8 hours.  Hemoglobin and hematocrit, blood     Status: Abnormal   Collection Time: 02/23/22 11:11 AM  Result Value Ref Range   Hemoglobin 7.0 (L) 13.0 - 17.0 g/dL   HCT 21.9 (L) 39.0 - 52.0 %    Comment: Performed at [facility], [address], [place], [place] [address]  Prepare RBC (crossmatch)     Status: None   Collection Time: 02/23/22 11:30 AM  Result Value Ref Range   Order Confirmation      ORDER PROCESSED BY BLOOD BANK Performed at [facility], [address] [place], [place] [address]   CBG monitoring, ED     Status: Abnormal   Collection Time: 02/23/22 12:05 PM  Result Value Ref Range   Glucose-Capillary 184 (H) 70 - 99 mg/dL    Comment: Glucose reference range applies only to samples taken after fasting for at least 8 hours.   No results found.  Pending Labs Unresulted Labs (From admission, onward)     Start     Ordered   02/21/2022 0500  CBC  Tomorrow morning,   R       Question:  Specimen collection method  Answer:  Lab=Lab collect   02/23/22 1017   02/25/2022 0500  Magnesium  Tomorrow morning,   R       Question:  Specimen collection method  Answer:  Lab=Lab collect    02/23/22 1017   03/04/2022 0500  Comprehensive metabolic panel  Tomorrow morning,   R  Question:  Specimen collection method  Answer:  Lab=Lab collect   02/23/22 1017   02/15/2022 1341  Urine Culture  (Urine Culture)  Once,   R       Question:  Indication  Answer:  Dysuria   03/04/2022 1340            Vitals/Pain Today's Vitals   02/23/22 1338 02/23/22 1400 02/23/22 1430 02/23/22 1445  BP: 103/63 (!) 123/57 (!) 107/56   Pulse: 64 65 63   Resp: (!) '22 19 16   '$ Temp: 97.6 F (36.4 C)     TempSrc: Axillary     SpO2: 98% 99% 98%   Weight:      Height:      PainSc:    Asleep    Isolation Precautions No active isolations  Medications Medications  pantoprozole (PROTONIX) 80 mg /NS 100 mL infusion (8 mg/hr Intravenous New Bag/Given 02/23/22 0748)  amiodarone (PACERONE) tablet 100 mg (0 mg Oral Hold 02/23/22 0902)  atorvastatin (LIPITOR) tablet 40 mg (40 mg Oral Given 02/23/22 0903)  ezetimibe (ZETIA) tablet 10 mg (10 mg Oral Given 02/23/22 0903)  Vericiguat TABS 2.5 mg (2.5 mg Oral Not Given 02/23/22 0903)  ALPRAZolam (XANAX) tablet 0.25 mg (has no administration in time range)  predniSONE (DELTASONE) tablet 5 mg (5 mg Oral Given 02/23/22 0803)  mycophenolate (CELLCEPT) capsule 250 mg (250 mg Oral Given 02/23/22 0903)  tacrolimus (PROGRAF) capsule 0.5 mg (0.5 mg Oral Given 02/23/22 0903)  fluticasone (FLONASE) 50 MCG/ACT nasal spray 1 spray (has no administration in time range)  loratadine (CLARITIN) tablet 10 mg (has no administration in time range)  sodium chloride flush (NS) 0.9 % injection 3 mL (3 mLs Intravenous Given 02/23/22 0901)  sodium chloride flush (NS) 0.9 % injection 3 mL (has no administration in time range)  0.9 %  sodium chloride infusion (has no administration in time range)  acetaminophen (TYLENOL) tablet 650 mg (has no administration in time range)    Or  acetaminophen (TYLENOL) suppository 650 mg (has no administration in time range)  ondansetron (ZOFRAN) tablet 4  mg ( Oral See Alternative 02/27/2022 2025)    Or  ondansetron (ZOFRAN) injection 4 mg (4 mg Intravenous Given 02/10/2022 2025)  insulin aspart (novoLOG) injection 0-15 Units (3 Units Subcutaneous Given 02/23/22 1200)  albuterol (PROVENTIL) (2.5 MG/3ML) 0.083% nebulizer solution 2.5 mg (has no administration in time range)  mupirocin ointment (BACTROBAN) 2 % ( Nasal Given 02/23/22 0902)  0.9 %  sodium chloride infusion (Manually program via Guardrails IV Fluids) (0 mLs Intravenous Hold 02/23/22 1335)  ondansetron (ZOFRAN) injection 4 mg (4 mg Intravenous Given 02/12/2022 1036)  sodium chloride 0.9 % bolus 500 mL (0 mLs Intravenous Stopped 02/17/2022 1332)  pantoprazole (PROTONIX) 80 mg /NS 100 mL IVPB (0 mg Intravenous Stopped 03/05/2022 1332)  sodium zirconium cyclosilicate (LOKELMA) packet 10 g (10 g Oral Given 03/09/2022 1458)    Mobility walks with device and assist

## 2022-02-23 NOTE — Consult Note (Signed)
[facility] Renal Consultation Note  Requesting MD: Manuella Ghazi Indication for Consultation: A on CRF in transplant patient   HPI:  Daniel Myers is a 77 y.o. 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 Joseph Berkshire. 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  Pt has had a lot of medical complications over the last 8 months-  a lot of cardiac issues-  Afib/chest pain, falls. S/p hosp from 11/6-11/13 and then 11/24- 11/28.  He comes to [facility] ER recently felt to have UTI and started on Augmentin-  now with N/V and bloody diarrhea.  Crt which had been 2.6 on 1/3 is now 4.9 so we are asked to consult.  Urine yesterday negative for blood  on dip but with 100 of protein and greater than 50 WBC- renal trasnplant [place] negative for hydro or vascular issue-  BP soft-  as low as 82 systolic-  on amiodarone, toprol and demedex as OP.  When I see him , he is alert and in NAD-  there is dark colored urine in canister-  he denies nausea or SOB-  he does have edema   Creatinine, Ser  Date/Time Value Ref Range Status  02/23/2022 06:17 AM 4.95 (H) 0.61 - 1.24 mg/dL Final  02/10/2022 10:40 AM 4.93 (H) 0.61 - 1.24 mg/dL Final  02/10/2022 11:54 AM 2.69 (H) 0.76 - 1.27 mg/dL Final  01/18/2022 01:50 PM 2.93 (H) 0.76 - 1.27 mg/dL Final  01/05/2022 12:50 AM 2.34 (H) 0.61 - 1.24 mg/dL Final  01/04/2022 12:44 AM 2.48 (H) 0.61 - 1.24 mg/dL Final  01/03/2022 12:56 AM 2.35 (H) 0.61 - 1.24 mg/dL Final  01/02/2022 12:57 AM 2.14 (H) 0.61 - 1.24 mg/dL Final  01/01/2022 05:55 AM 2.20 (H) 0.61 - 1.24 mg/dL Final  01/01/2022 05:47 AM 2.11 (H) 0.61 - 1.24 mg/dL Final  12/28/2021 04:15 PM 1.98 (H) 0.76 - 1.27 mg/dL Final  12/21/2021 04:51 AM  2.60 (H) 0.61 - 1.24 mg/dL Final  12/20/2021 07:31 AM 2.69 (H) 0.61 - 1.24 mg/dL Final  12/19/2021 05:54 AM 3.04 (H) 0.61 - 1.24 mg/dL Final  12/18/2021 03:48 AM 3.31 (H) 0.61 - 1.24 mg/dL Final  12/17/2021 04:14 AM 3.43 (H) 0.61 - 1.24 mg/dL Final  12/16/2021 03:46 AM 3.60 (H) 0.61 - 1.24 mg/dL Final  12/15/2021 03:42 AM 3.74 (H) 0.61 - 1.24 mg/dL Final  12/14/2021 11:01 AM 3.77 (H) 0.61 - 1.24 mg/dL Final  12/09/2021 10:16 AM 3.32 (H) 0.61 - 1.24 mg/dL Final  12/02/2021 10:08 AM 2.90 (H) 0.76 - 1.27 mg/dL Final  11/20/2021 04:16 AM 2.51 (H) 0.61 - 1.24 mg/dL Final  11/19/2021 04:20 AM 2.18 (H) 0.61 - 1.24 mg/dL Final  11/18/2021 03:46 AM 2.18 (H) 0.61 - 1.24 mg/dL Final  11/17/2021 05:21 PM 2.06 (H) 0.61 - 1.24 mg/dL Final  11/17/2021 05:00 AM 1.94 (H) 0.61 - 1.24 mg/dL Final  11/16/2021 08:16 PM 2.00 (H) 0.61 - 1.24 mg/dL Final  11/16/2021 07:10 PM 1.96 (H) 0.61 - 1.24 mg/dL Final  11/10/2021 10:04 AM 2.08 (H) 0.76 - 1.27 mg/dL Final  10/29/2021 09:12 PM 2.89 (H) 0.61 - 1.24 mg/dL Final  10/27/2021 12:10 PM 2.30 (H) 0.61 - 1.24 mg/dL Final  10/14/2021  10:19 AM 2.47 (H) 0.61 - 1.24 mg/dL Final  08/05/2021 10:06 AM 1.71 (H) 0.76 - 1.27 mg/dL Final  07/29/2021 10:26 AM 1.83 (H) 0.61 - 1.24 mg/dL Final  06/23/2021 09:46 AM 1.83 (H) 0.76 - 1.27 mg/dL Final  06/10/2021 10:23 AM 1.85 (H) 0.61 - 1.24 mg/dL Final  04/30/2021 10:27 AM 2.34 (H) 0.76 - 1.27 mg/dL Final  02/26/2021 09:47 AM 2.07 (H) 0.76 - 1.27 mg/dL Final  02/16/2021 10:11 AM 2.48 (H) 0.61 - 1.24 mg/dL Final  12/17/2020 11:29 AM 2.29 (H) 0.61 - 1.24 mg/dL Final  11/05/2020 11:01 AM 2.11 (H) 0.61 - 1.24 mg/dL Final  10/03/2020 11:56 AM 2.16 (H) 0.61 - 1.24 mg/dL Final  09/09/2020 08:51 AM 1.93 (H) 0.61 - 1.24 mg/dL Final  09/09/2020 08:39 AM 1.90 (H) 0.61 - 1.24 mg/dL Final  09/04/2020 11:00 AM 1.77 (H) 0.76 - 1.27 mg/dL Final  08/28/2020 11:59 AM 1.70 (H) 0.61 - 1.24 mg/dL Final  08/05/2020 02:14 PM 1.70 (H) 0.76 - 1.27  mg/dL Final  06/26/2020 10:08 AM 1.67 (H) 0.76 - 1.27 mg/dL Final  06/04/2020 04:15 PM 1.64 (H) 0.76 - 1.27 mg/dL Final  05/22/2020 04:29 PM 1.86 (H) 0.76 - 1.27 mg/dL Final  05/07/2020 11:04 AM 1.32 (H) 0.76 - 1.27 mg/dL Final  04/17/2020 04:56 PM 2.01 (H) 0.76 - 1.27 mg/dL Final     PMHx:   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   nodular on left jawline - excision   Basal cell carcinoma 07/01/2014   nodular on left hawling - CX3+5FU+excision   Basal cell carcinoma 10/10/2014   nodular on right forearm, middle - tx p bx   Basal cell carcinoma 02/11/2015   left neck - CX3 + excision   Basal cell carcinoma 06/14/2016   left jawline - CX3+5FU   Basal cell carcinoma 02/22/2017   superficial and nodular on left neck - excision   Basal cell carcinoma 04/11/2018   superficial and nodular on right inferior forearm - CX3+Cautery+5FU   Chronic kidney disease    History of renal transplant    Hyperlipidemia    Hypertension    NSTEMI (non-ST elevated myocardial infarction) ([place]) 02/13/2020   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   Left [place] (well diff)   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   Right Breast (in situ)   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   Left Forearm - anterior (mod diff)   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   Neck - anterior (mod diff)   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   in situ on left arm - CX3+5FU   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   hypertrophic on left ear - CX3+5FU   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   in situ on right temple - CX3+5FU   Squamous cell carcinoma of skin 11/08/2011   right upper forearm - tx p bx   Squamous cell carcinoma of skin 11/08/2011   left upper forearm - tx p bx   Squamous cell carcinoma of skin 11/08/2011   left hand - tx p bx   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/27/2013   in situ on left lower back -  CX3+5FU   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/27/2013   in situ on left forehead - CX3+5FU   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/27/2013   in situ on right temple - watch per ST   Squamous cell  carcinoma of skin 06/27/2013   in situ on right forearm - CX3+5FU   Squamous cell carcinoma of skin 07/01/2014   well differentiated on right sideburn - CX3+5FU   Squamous cell carcinoma of skin 07/01/2014   in situ on left shoulder - CX3+5FU   Squamous cell carcinoma of skin 07/01/2014   in situ on right forearm, proximal - CX3+5FU+Cautery   Squamous cell carcinoma of skin 07/01/2014   in situ on right forearm, distal - CX3+5FU   Squamous cell carcinoma of skin 09/30/2015   in situ on posterior left ear - CX3+5FU   Squamous cell carcinoma of skin 02/22/2017   in situ on right upper arm - CX3+5FU   Squamous cell carcinoma of skin 02/22/2017   in situ on left upper arm - CX3+5FU   Squamous cell carcinoma of skin 04/11/2018   in situ on right temple - CX3+5FU   Squamous cell carcinoma of skin 04/11/2018   in situ on lateral right arm - MOHs   Squamous cell carcinoma of skin 04/11/2018   in situ on right upper arm - MOHs   Squamous cell carcinoma of skin 04/11/2018   in situ on left sideburn   Squamous cell carcinoma of skin 04/11/2018   in situ on right flank - tx p bx   Squamous cell carcinoma of skin 09/28/2018   in situ on right inner ear - tx p bx   Squamous cell carcinoma of skin 09/28/2018   in situ on left inner ear - tx p bx   Squamous cell carcinoma of skin 09/28/2018   in situ on right arm - tx p bx   Squamous cell carcinoma of skin 03/21/2019   in situ on right antihelix (Mitkov treated topically)   Squamous cell carcinoma of skin 03/21/2019   in situ on right neck - CX3+5FU   Squamous cell carcinoma of skin 03/21/2019   in situ on left outer eye, inf (MOHs done 05/23/2019)   Type 2 diabetes mellitus ([place])    Unspecified atrial fibrillation ([place]) 02/17/2020    Past  Surgical History:  Procedure Laterality Date   CARDIOVERSION N/A 10/16/2020   Procedure: CARDIOVERSION;  Surgeon: Larey Dresser, MD;  Location: [facility] ENDOSCOPY;  Service: Cardiovascular;  Laterality: N/A;   KIDNEY TRANSPLANT Right    RIGHT/LEFT HEART CATH AND CORONARY ANGIOGRAPHY N/A 09/09/2020   Procedure: RIGHT/LEFT HEART CATH AND CORONARY ANGIOGRAPHY;  Surgeon: Larey Dresser, MD;  Location: [facility];  Service: Cardiovascular;  Laterality: N/A;    Family Hx:  Family History  Problem Relation Age of Onset   Clotting disorder Mother    Hypertension Sister    Diabetes Sister     Social History:  reports that he has been smoking pipe. He has never used smokeless tobacco. He reports that he does not drink alcohol and does not use drugs.  Allergies:  Allergies  Allergen Reactions   Other Rash and Palpitations    Use paper tape.  tachycardia   Tape Rash    Use paper tape.    Trazodone And Nefazodone Palpitations    tachycardia   Mirtazapine Other (See Comments)    imbalance   Elemental Sulfur Rash   Sulfa Antibiotics Rash    Medications: Prior to Admission medications   Medication Sig Start Date End Date Taking? Authorizing Provider  acetaminophen (TYLENOL) 500 MG tablet Take 500 mg by mouth every 8 (eight) hours as needed for moderate pain.   Yes [provider]  albuterol (VENTOLIN HFA) 108 (90 Base) MCG/ACT inhaler Inhale 2 puffs into the lungs every 6 (six) hours as needed for wheezing or shortness of breath. 11/13/21  Yes Stacks, [name], MD  ALPRAZolam Duanne Moron) 0.25 MG tablet Take 1 tablet (0.25 mg total) by mouth at bedtime as needed for anxiety. 12/28/21  Yes Claretta Fraise, MD  amiodarone (PACERONE) 200 MG tablet Take 0.5 tablets (100 mg total) by mouth daily. 10/14/21  Yes Larey Dresser, MD  amoxicillin-clavulanate (AUGMENTIN) 875-125 MG tablet Take 1 tablet by mouth 2 (two) times daily. Take all of this medication 02/14/22  Yes Stacks, [name], MD   atorvastatin (LIPITOR) 40 MG tablet Take 1 tablet (40 mg total) by mouth daily. 11/10/21  Yes Stacks, [name], MD  ELIQUIS 5 MG TABS tablet Take 1 tablet by mouth twice daily 11/09/21  Yes Larey Dresser, MD  ezetimibe (ZETIA) 10 MG tablet Take 1 tablet (10 mg total) by mouth daily. 08/17/21  Yes Satira Sark, MD  fluticasone [facility]) 50 MCG/ACT nasal spray Place 1 spray into both nostrils daily as needed for allergies. 12/21/21  Yes Johnson, Clanford L, MD  insulin NPH Human (NOVOLIN N) 100 UNIT/ML injection 10 units AC breakfast and 10 units AC supper 08/05/21  Yes Stacks, [name], MD  insulin regular (NOVOLIN R) 100 units/mL injection 10 units w bkfst and 6 units w dinner 01/05/22  Yes Domenic Polite, MD  loratadine (CLARITIN) 10 MG tablet Take 10 mg by mouth daily as needed for allergies. 10/24/06  Yes [provider]  metoprolol succinate (TOPROL-XL) 25 MG 24 hr tablet Take 1 tablet (25 mg total) by mouth daily. Take with or immediately following a meal. 12/21/21  Yes Johnson, Clanford L, MD  mycophenolate (CELLCEPT) 250 MG capsule Take 250 mg by mouth 2 (two) times daily.   Yes [provider]  nitroGLYCERIN (NITROSTAT) 0.4 MG SL tablet Place 1 tablet (0.4 mg total) under the tongue every 5 (five) minutes as needed for chest pain. 11/05/20  Yes Larey Dresser, MD  ondansetron (ZOFRAN) 4 MG tablet TAKE 1 TABLET BY MOUTH EVERY 8 HOURS AS NEEDED FOR NAUSEA FOR VOMITING 01/24/22  Yes Stacks, [name], MD  predniSONE (DELTASONE) 5 MG tablet Take 5 mg by mouth daily with breakfast.   Yes [provider]  tacrolimus (PROGRAF) 0.5 MG capsule Take 1 mg by mouth in the morning and at bedtime. 07/08/15  Yes [provider]  torsemide (DEMADEX) 20 MG tablet Take 2 tablets (40 mg total) by mouth daily. 01/26/22  Yes [place], Jessica M, FNP  Vericiguat (VERQUVO) 2.5 MG TABS Take 2.5 mg by mouth daily. 12/01/21  Yes Larey Dresser, MD    I have reviewed the patient's  current medications.  Labs:  Results for orders placed or performed during the hospital encounter of 02/18/2022 (from the past 48 hour(s))  CBC with Differential     Status: Abnormal   Collection Time: 02/13/2022 10:40 AM  Result Value Ref Range   WBC 3.7 (L) 4.0 - 10.5 K/uL   RBC 1.96 (L) 4.22 - 5.81 MIL/uL   Hemoglobin 5.5 (LL) 13.0 - 17.0 g/dL    Comment: This critical result has verified and been called to M. CUGINO by Joaquin Courts on 01 15 2024 at 1111, and has been read back.    HCT 18.1 (L) 39.0 - 52.0 %   MCV 92.3 80.0 - 100.0 fL   MCH 28.1 26.0 - 34.0 pg   MCHC  30.4 30.0 - 36.0 g/dL   RDW 18.5 (H) 11.5 - 15.5 %   Platelets 99 (L) 150 - 400 K/uL    Comment: SPECIMEN CHECKED FOR CLOTS Immature Platelet Fraction may be clinically indicated, consider ordering this additional test [id number] CONSISTENT WITH PREVIOUS RESULT    nRBC 0.0 0.0 - 0.2 %   Neutrophils Relative % 67 %   Neutro Abs 2.5 1.7 - 7.7 K/uL   Lymphocytes Relative 15 %   Lymphs Abs 0.6 (L) 0.7 - 4.0 K/uL   Monocytes Relative 8 %   Monocytes Absolute 0.3 0.1 - 1.0 K/uL   Eosinophils Relative 3 %   Eosinophils Absolute 0.1 0.0 - 0.5 K/uL   Basophils Relative 0 %   Basophils Absolute 0.0 0.0 - 0.1 K/uL   Smear Review PLATELET COUNT CONFIRMED BY SMEAR    Immature Granulocytes 7 %   Abs Immature Granulocytes 0.27 (H) 0.00 - 0.07 K/uL   Dimorphism PRESENT    Polychromasia PRESENT    Ovalocytes PRESENT     Comment: Performed at [facility], [address] [place], [place] [address]  Comprehensive metabolic panel     Status: Abnormal   Collection Time: 02/15/2022 10:40 AM  Result Value Ref Range   Sodium 123 (L) 135 - 145 mmol/L   Potassium 5.4 (H) 3.5 - 5.1 mmol/L   Chloride 95 (L) 98 - 111 mmol/L   CO2 13 (L) 22 - 32 mmol/L   Glucose, Bld 210 (H) 70 - 99 mg/dL    Comment: Glucose reference range applies only to samples taken after fasting for at least 8 hours.   BUN 112 (H) 8 - 23 mg/dL    Comment:  RESULTS CONFIRMED BY MANUAL DILUTION   Creatinine, Ser 4.93 (H) 0.61 - 1.24 mg/dL   Calcium 6.9 (L) 8.9 - 10.3 mg/dL   Total Protein 4.8 (L) 6.5 - 8.1 g/dL   Albumin 2.6 (L) 3.5 - 5.0 g/dL   AST 22 15 - 41 U/L   ALT 16 0 - 44 U/L   Alkaline Phosphatase 55 38 - 126 U/L   Total Bilirubin 0.8 0.3 - 1.2 mg/dL   GFR, Estimated 12 (L) >60 mL/min    Comment: (NOTE) Calculated using the CKD-EPI Creatinine Equation (2021)    Anion gap 15 5 - 15    Comment: Performed at [facility], [address], [place], [place] [address]  Lipase, blood     Status: None   Collection Time: 02/08/2022 10:40 AM  Result Value Ref Range   Lipase 22 11 - 51 U/L    Comment: Performed at [facility], [address] [place], [place] [address]  TSH     Status: Abnormal   Collection Time: 02/17/2022 10:53 AM  Result Value Ref Range   TSH 8.643 (H) 0.350 - 4.500 uIU/mL    Comment: Performed by a 3rd Generation assay with a functional sensitivity of <=0.01 uIU/mL. Performed at [facility], [address], [place], [place] [address]   Osmolality     Status: Abnormal   Collection Time: 02/25/2022 10:53 AM  Result Value Ref Range   Osmolality 308 (H) 275 - 295 mOsm/kg    Comment: REPEATED TO VERIFY Performed at [facility], [address] [place], [place] [address]   Type and screen [facility]     Status: None (Preliminary result)   Collection Time: 03/08/2022 12:30 PM  Result Value Ref Range   ABO/RH(D) A POS    Antibody  Screen NEG    Sample Expiration 02/25/2022,2359    Unit Number [id number]    Blood Component Type RED CELLS,LR    Unit division 00    Status of Unit ISSUED,FINAL    Transfusion Status OK TO TRANSFUSE    Crossmatch Result      Compatible Performed at [facility], [address], [place], [place] [address]    Unit Number [id number]    Blood Component Type RED CELLS,LR    Unit division 00    Status of Unit ALLOCATED    Transfusion Status OK TO TRANSFUSE    Crossmatch  Result Compatible   Prepare RBC (crossmatch)     Status: None   Collection Time: 02/23/2022 12:31 PM  Result Value Ref Range   Order Confirmation      ORDER PROCESSED BY BLOOD BANK Performed at [facility], [address], [place], [place] [address]   Protime-INR     Status: Abnormal   Collection Time: 02/14/2022 12:31 PM  Result Value Ref Range   Prothrombin Time 24.9 (H) 11.4 - 15.2 seconds   INR 2.3 (H) 0.8 - 1.2    Comment: (NOTE) INR goal varies based on device and disease states. Performed at [facility], [address] [place], [place] [address]   Urinalysis, Routine w reflex microscopic     Status: Abnormal   Collection Time: 03/10/2022  3:00 PM  Result Value Ref Range   Color, Urine YELLOW YELLOW   APPearance HAZY (A) CLEAR   Specific Gravity, Urine 1.013 1.005 - 1.030   pH 5.0 5.0 - 8.0   Glucose, UA 50 (A) NEGATIVE mg/dL   Hgb urine dipstick NEGATIVE NEGATIVE   Bilirubin Urine NEGATIVE NEGATIVE   Ketones, ur NEGATIVE NEGATIVE mg/dL   Protein, ur 100 (A) NEGATIVE mg/dL   Nitrite NEGATIVE NEGATIVE   Leukocytes,Ua MODERATE (A) NEGATIVE   RBC / HPF 0-5 0 - 5 RBC/hpf   WBC, UA >50 (H) 0 - 5 WBC/hpf   Bacteria, UA NONE SEEN NONE SEEN   Squamous Epithelial / HPF 0-5 0 - 5 /HPF   Mucus PRESENT    Amorphous Crystal PRESENT     Comment: Performed at [facility], [address], [place], [place] [address]  Osmolality, urine     Status: None   Collection Time: 03/01/2022  3:00 PM  Result Value Ref Range   Osmolality, Ur 318 300 - 900 mOsm/kg    Comment: REPEATED TO VERIFY Performed at [facility], [address] [place], [place] [address]   CBG monitoring, ED     Status: Abnormal   Collection Time: 02/18/2022  4:27 PM  Result Value Ref Range   Glucose-Capillary 194 (H) 70 - 99 mg/dL    Comment: Glucose reference range applies only to samples taken after fasting for at least 8 hours.  CBG monitoring, ED     Status: Abnormal   Collection Time: 02/21/2022  8:19 PM   Result Value Ref Range   Glucose-Capillary 172 (H) 70 - 99 mg/dL    Comment: Glucose reference range applies only to samples taken after fasting for at least 8 hours.  CBG monitoring, ED     Status: Abnormal   Collection Time: 02/10/2022 11:54 PM  Result Value Ref Range   Glucose-Capillary 149 (H) 70 - 99 mg/dL    Comment: Glucose reference range applies only to samples taken after fasting for at least 8 hours.  MRSA Next Gen by PCR, Nasal  Status: Abnormal   Collection Time: 02/23/22  1:02 AM   Specimen: Nasal Mucosa; Nasal Swab  Result Value Ref Range   MRSA by PCR Next Gen DETECTED (A) NOT DETECTED    Comment: RESULT CALLED TO, READ BACK BY AND VERIFIED WITH: M.MOSTELLER AT 0231 ON 01.16.24 BY ADGER J         The GeneXpert MRSA Assay (FDA approved for NASAL specimens only), is one component of a comprehensive MRSA colonization surveillance program. It is not intended to diagnose MRSA infection nor to guide or monitor treatment for MRSA infections. Performed at [facility], [address], [place], [place] [address]   CBG monitoring, ED     Status: Abnormal   Collection Time: 02/23/22  3:37 AM  Result Value Ref Range   Glucose-Capillary 115 (H) 70 - 99 mg/dL    Comment: Glucose reference range applies only to samples taken after fasting for at least 8 hours.  Magnesium     Status: Abnormal   Collection Time: 02/23/22  6:17 AM  Result Value Ref Range   Magnesium 2.6 (H) 1.7 - 2.4 mg/dL    Comment: Performed at [facility], [address], [place], [place] [address]  Comprehensive metabolic panel     Status: Abnormal   Collection Time: 02/23/22  6:17 AM  Result Value Ref Range   Sodium 128 (L) 135 - 145 mmol/L   Potassium 5.1 3.5 - 5.1 mmol/L   Chloride 99 98 - 111 mmol/L   CO2 17 (L) 22 - 32 mmol/L   Glucose, Bld 106 (H) 70 - 99 mg/dL    Comment: Glucose reference range applies only to samples taken after fasting for at least 8 hours.   BUN 112 (H) 8 - 23 mg/dL     Comment: RESULTS CONFIRMED BY MANUAL DILUTION   Creatinine, Ser 4.95 (H) 0.61 - 1.24 mg/dL   Calcium 7.0 (L) 8.9 - 10.3 mg/dL   Total Protein 4.7 (L) 6.5 - 8.1 g/dL   Albumin 2.5 (L) 3.5 - 5.0 g/dL   AST 20 15 - 41 U/L   ALT 17 0 - 44 U/L   Alkaline Phosphatase 56 38 - 126 U/L   Total Bilirubin 1.2 0.3 - 1.2 mg/dL   GFR, Estimated 12 (L) >60 mL/min    Comment: (NOTE) Calculated using the CKD-EPI Creatinine Equation (2021)    Anion gap 12 5 - 15    Comment: Performed at [facility], [address], [place], [place] [address]  CBC     Status: Abnormal   Collection Time: 02/23/22  6:17 AM  Result Value Ref Range   WBC 4.5 4.0 - 10.5 K/uL   RBC 2.72 (L) 4.22 - 5.81 MIL/uL   Hemoglobin 7.9 (L) 13.0 - 17.0 g/dL    Comment: POST TRANSFUSION SPECIMEN DELTA CHECK NOTED    HCT 25.4 (L) 39.0 - 52.0 %   MCV 93.4 80.0 - 100.0 fL   MCH 29.0 26.0 - 34.0 pg   MCHC 31.1 30.0 - 36.0 g/dL   RDW 18.2 (H) 11.5 - 15.5 %   Platelets 95 (L) 150 - 400 K/uL    Comment: Immature Platelet Fraction may be clinically indicated, consider ordering this additional test [id number]    nRBC 0.0 0.0 - 0.2 %    Comment: Performed at [facility], [address], [place], [place] [address]  CBG monitoring, ED     Status: Abnormal   Collection Time: 02/23/22  8:02 AM  Result Value Ref  Range   Glucose-Capillary 103 (H) 70 - 99 mg/dL    Comment: Glucose reference range applies only to samples taken after fasting for at least 8 hours.     ROS:  Pertinent items noted in HPI and remainder of comprehensive ROS otherwise negative.  Physical Exam: Vitals:   02/23/22 0900 02/23/22 0930  BP: 101/65 114/68  Pulse: 61 67  Resp: 16 20  Temp:    SpO2: 97% 100%     General: pale, alert NAD HEENT: PERRLA, EOMI, mucous membranes moist Neck: no JVD Heart: brady-  regular Lungs: dec BS at extreme bases but otherwise clear Abdomen: soft, non tender Extremities: pitting edema-  1-2+ Skin: warm and dry Neuro:  alert, non focal   Assessment/Plan: 77 year old WM significant cardiac pathology and s/p renal transplant in 2008-  now presents with N/V /bloody diarrhea with A on CRF-  also hypotension and pyuria 1.Renal- s/p extended criteria kidney donor in 2008-  function had been declining-  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  He now presents with A on CRF in the setting of bloody diarrhea, anemia and hypotension-  also with pyuria.  Kidney imaging is unremarkable-  pyuria as above that has been present since 1/3-  did not respond to antibiotic therapy although may not have been complete-  plan to resend urine for culture-  no antibiotics yet-  could he also have AIN?  Possibly but unclear due to what.  The other issue is his hypotension-  could certainly have ATN related to that.  Agree with holding the betablocker and diuretic right now.  The good news is that he is nonoliguric and is not uremic.  He tells me as he had told others that he does not desire to go on dialysis if this kidney fails.  He understands the implications of that 2. S/p renal transplant-  continue home doses of prograf, cellcept and prednisone 3. Hypertension/volume  - is hypotensive and has pitting edema but likely third spacing as well -  hold beta blocker and diuretic for now-  BP right now OK-  consider midodrine or higher dose steroids if BP continues to be low 4. Anemia  - probably due to CKD but also acute BL anemia on [facility]-  supportive care 5, Hyponatremia-  probably hypervolemic hyponatremia-  not severe, follow    Louis Meckel 02/23/2022, 11:17 AM

## 2022-02-23 NOTE — ED Notes (Signed)
Patient had another bloody stool in brief. Bedside cleaning done and linen change.

## 2022-02-23 NOTE — Progress Notes (Signed)
Palliative: Chart review completed.  Daniel Myers is to transfer to [facility]. Palliative consult deferred until transport.  Conference with attending, nephrology, bedside nursing staff, transition of care team related to patient condition, needs, goals of care.  Plan: Transfer to [place] for further testing and treatment.  No charge Quinn Axe, NP Palliative medicine team Team phone [phone] Greater than 50% of this time was spent counseling and coordinating care related to the above assessment and plan.

## 2022-02-24 ENCOUNTER — Encounter (HOSPITAL_COMMUNITY): Admission: EM | Disposition: E | Payer: Self-pay | Source: Home / Self Care | Attending: Internal Medicine

## 2022-02-24 ENCOUNTER — Encounter (HOSPITAL_COMMUNITY): Payer: Self-pay | Admitting: Internal Medicine

## 2022-02-24 ENCOUNTER — Inpatient Hospital Stay (HOSPITAL_COMMUNITY): Payer: Medicare Other | Admitting: Certified Registered Nurse Anesthetist

## 2022-02-24 DIAGNOSIS — I110 Hypertensive heart disease with heart failure: Secondary | ICD-10-CM | POA: Diagnosis not present

## 2022-02-24 DIAGNOSIS — Z940 Kidney transplant status: Secondary | ICD-10-CM | POA: Diagnosis not present

## 2022-02-24 DIAGNOSIS — I5043 Acute on chronic combined systolic (congestive) and diastolic (congestive) heart failure: Secondary | ICD-10-CM | POA: Diagnosis not present

## 2022-02-24 DIAGNOSIS — I509 Heart failure, unspecified: Secondary | ICD-10-CM

## 2022-02-24 DIAGNOSIS — K922 Gastrointestinal hemorrhage, unspecified: Secondary | ICD-10-CM | POA: Diagnosis not present

## 2022-02-24 DIAGNOSIS — I2510 Atherosclerotic heart disease of native coronary artery without angina pectoris: Secondary | ICD-10-CM | POA: Diagnosis not present

## 2022-02-24 DIAGNOSIS — F17210 Nicotine dependence, cigarettes, uncomplicated: Secondary | ICD-10-CM

## 2022-02-24 DIAGNOSIS — G9341 Metabolic encephalopathy: Secondary | ICD-10-CM

## 2022-02-24 DIAGNOSIS — I480 Paroxysmal atrial fibrillation: Secondary | ICD-10-CM

## 2022-02-24 DIAGNOSIS — D62 Acute posthemorrhagic anemia: Secondary | ICD-10-CM | POA: Diagnosis not present

## 2022-02-24 DIAGNOSIS — K449 Diaphragmatic hernia without obstruction or gangrene: Secondary | ICD-10-CM

## 2022-02-24 DIAGNOSIS — D132 Benign neoplasm of duodenum: Secondary | ICD-10-CM

## 2022-02-24 DIAGNOSIS — Z7901 Long term (current) use of anticoagulants: Secondary | ICD-10-CM

## 2022-02-24 DIAGNOSIS — K317 Polyp of stomach and duodenum: Secondary | ICD-10-CM

## 2022-02-24 DIAGNOSIS — N184 Chronic kidney disease, stage 4 (severe): Secondary | ICD-10-CM | POA: Diagnosis not present

## 2022-02-24 HISTORY — PX: BIOPSY: SHX5522

## 2022-02-24 HISTORY — PX: ESOPHAGOGASTRODUODENOSCOPY (EGD) WITH PROPOFOL: SHX5813

## 2022-02-24 LAB — CBC
HCT: 29.1 % — ABNORMAL LOW (ref 39.0–52.0)
Hemoglobin: 9.7 g/dL — ABNORMAL LOW (ref 13.0–17.0)
MCH: 29.3 pg (ref 26.0–34.0)
MCHC: 33.3 g/dL (ref 30.0–36.0)
MCV: 87.9 fL (ref 80.0–100.0)
Platelets: 98 10*3/uL — ABNORMAL LOW (ref 150–400)
RBC: 3.31 MIL/uL — ABNORMAL LOW (ref 4.22–5.81)
RDW: 16.8 % — ABNORMAL HIGH (ref 11.5–15.5)
WBC: 4 10*3/uL (ref 4.0–10.5)
nRBC: 0 % (ref 0.0–0.2)

## 2022-02-24 LAB — GLUCOSE, CAPILLARY
Glucose-Capillary: 96 mg/dL (ref 70–99)
Glucose-Capillary: 114 mg/dL — ABNORMAL HIGH (ref 70–99)
Glucose-Capillary: 158 mg/dL — ABNORMAL HIGH (ref 70–99)
Glucose-Capillary: 159 mg/dL — ABNORMAL HIGH (ref 70–99)
Glucose-Capillary: 165 mg/dL — ABNORMAL HIGH (ref 70–99)

## 2022-02-24 LAB — COMPREHENSIVE METABOLIC PANEL
ALT: 18 U/L (ref 0–44)
AST: 20 U/L (ref 15–41)
Albumin: 2.5 g/dL — ABNORMAL LOW (ref 3.5–5.0)
Alkaline Phosphatase: 64 U/L (ref 38–126)
Anion gap: 13 (ref 5–15)
BUN: 106 mg/dL — ABNORMAL HIGH (ref 8–23)
CO2: 15 mmol/L — ABNORMAL LOW (ref 22–32)
Calcium: 7.3 mg/dL — ABNORMAL LOW (ref 8.9–10.3)
Chloride: 100 mmol/L (ref 98–111)
Creatinine, Ser: 4.44 mg/dL — ABNORMAL HIGH (ref 0.61–1.24)
GFR, Estimated: 13 mL/min — ABNORMAL LOW (ref >60)
Glucose, Bld: 112 mg/dL — ABNORMAL HIGH (ref 70–99)
Potassium: 5.1 mmol/L (ref 3.5–5.1)
Sodium: 128 mmol/L — ABNORMAL LOW (ref 135–145)
Total Bilirubin: 1.5 mg/dL — ABNORMAL HIGH (ref 0.3–1.2)
Total Protein: 4.7 g/dL — ABNORMAL LOW (ref 6.5–8.1)

## 2022-02-24 LAB — URINE CULTURE: Culture: 40000 — AB

## 2022-02-24 LAB — PREPARE RBC (CROSSMATCH)

## 2022-02-24 LAB — PROTIME-INR
INR: 1.7 — ABNORMAL HIGH (ref 0.8–1.2)
Prothrombin Time: 19.5 seconds — ABNORMAL HIGH (ref 11.4–15.2)

## 2022-02-24 LAB — MAGNESIUM: Magnesium: 2.6 mg/dL — ABNORMAL HIGH (ref 1.7–2.4)

## 2022-02-24 SURGERY — ESOPHAGOGASTRODUODENOSCOPY (EGD) WITH PROPOFOL
Anesthesia: Monitor Anesthesia Care

## 2022-02-24 MED ORDER — PHENYLEPHRINE 80 MCG/ML (10ML) SYRINGE FOR IV PUSH (FOR BLOOD PRESSURE SUPPORT)
PREFILLED_SYRINGE | INTRAVENOUS | Status: DC | PRN
  Administered 2022-02-24 (×3): 160 ug via INTRAVENOUS

## 2022-02-24 MED ORDER — SODIUM CHLORIDE 0.9 % IV SOLN
1.0000 g | INTRAVENOUS | Status: DC
  Administered 2022-02-24: 1 g via INTRAVENOUS
  Filled 2022-02-24: qty 10

## 2022-02-24 MED ORDER — HALOPERIDOL LACTATE 2 MG/ML PO CONC: 0.5000 mg | ORAL | Status: DC | PRN

## 2022-02-24 MED ORDER — MORPHINE SULFATE (PF) 2 MG/ML IV SOLN
1.0000 mg | INTRAVENOUS | Status: DC | PRN
  Administered 2022-02-25: 1 mg via INTRAVENOUS
  Filled 2022-02-24: qty 1

## 2022-02-24 MED ORDER — HALOPERIDOL 0.5 MG PO TABS: 0.5000 mg | ORAL_TABLET | ORAL | Status: DC | PRN

## 2022-02-24 MED ORDER — ETOMIDATE 2 MG/ML IV SOLN
INTRAVENOUS | Status: DC | PRN
  Administered 2022-02-24: 8 mg via INTRAVENOUS

## 2022-02-24 MED ORDER — SODIUM CHLORIDE 0.9 % IV SOLN
20.0000 ug | Freq: Once | INTRAVENOUS | Status: DC
  Filled 2022-02-24: qty 5

## 2022-02-24 MED ORDER — PANTOPRAZOLE SODIUM 40 MG PO TBEC: 40.0000 mg | DELAYED_RELEASE_TABLET | Freq: Every day | ORAL | Status: DC

## 2022-02-24 MED ORDER — SODIUM CHLORIDE 0.9% IV SOLUTION: Freq: Once | INTRAVENOUS | Status: AC

## 2022-02-24 MED ORDER — HALOPERIDOL LACTATE 5 MG/ML IJ SOLN: 0.5000 mg | INTRAMUSCULAR | Status: DC | PRN

## 2022-02-24 MED ORDER — PROPOFOL 500 MG/50ML IV EMUL
INTRAVENOUS | Status: DC | PRN
  Administered 2022-02-24: 100 ug/kg/min via INTRAVENOUS

## 2022-02-24 MED ORDER — LIDOCAINE 2% (20 MG/ML) 5 ML SYRINGE
INTRAMUSCULAR | Status: DC | PRN
  Administered 2022-02-24: 100 mg via INTRAVENOUS

## 2022-02-24 SURGICAL SUPPLY — 15 items

## 2022-02-24 NOTE — IPAL (Signed)
Interdisciplinary Goals of Care Family Meeting   Date carried out:: 02/12/2022  Location of the meeting: Phone conference  Member's involved: Physician, Bedside Registered Nurse, Social Worker, and Family Member or next of kin  [place] or Loss adjuster, chartered: Anastasia Pall  Discussion: We discussed goals of care for [employer] .    The Clinical status was relayed to daughter over the phone in detail.   Updated and notified of patients medical condition.   Patient became lethargic after EGD, he continued to have bleeding from GI, unable to do preparation for colonoscopy considering altered mental status Offered CT angiogram of abdomen pelvis to find out bleeding vessel but in that case patient will require hemodialysis as he has CKD stage IV and his serum creatinine has been elevated, as patient is stated clearly that he would not want dialysis, patient's family decided to not proceed with CT angiogram.  Will continue with the blood transfusion to see if patient mental status improves, if he declines or gets more short of breath due to volume overload from blood transfusion patient's family would like [place] to proceed with comfort care per patient's wishes   Code status: Full DNR  Disposition: Continue current acute care if patient struggles, proceed with comfort care    Family are satisfied with Plan of action and management. All questions answered   Jacky Kindle MD [facility] See Amion for pager If no response to pager, please call [phone] until 7pm After 7pm, Please call E-link [phone]

## 2022-02-24 NOTE — Op Note (Signed)
[facility] Patient Name: Daniel Myers Procedure Date : 02/11/2022 MRN: [id number] Attending MD: Carlota Raspberry. Havery Moros , MD, [id number] Date of Birth: June 06, 1945 CSN: [id number] Age: 77 Admit Type: Inpatient Procedure:                Upper GI endoscopy Indications:              Gastrointestinal bleeding of unclear etiology -                            stage IV CKD - dark to burgandy stool color, less                            dark recently. EGD to clear upper tract Providers:                Remo Lipps P. Havery Moros, MD, Glori Bickers, RN, Vladimir Crofts, RN, [facility] Technician, Technician Referring MD:              Medicines:                Monitored Anesthesia Care Complications:            No immediate complications. Estimated blood loss:                            Minimal. Estimated Blood Loss:     Estimated blood loss was minimal. Procedure:                Pre-Anesthesia Assessment:                           - Prior to the procedure, a History and Physical                            was performed, and patient medications and                            allergies were reviewed. The patient's tolerance of                            previous anesthesia was also reviewed. The risks                            and benefits of the procedure and the sedation                            options and risks were discussed with the patient.                            All questions were answered, and informed consent                            was obtained. Prior Anticoagulants: The patient has  taken Eliquis (apixaban), last dose was 3 days                            prior to procedure. ASA Grade Assessment: IV - A                            patient with severe systemic disease that is a                            constant threat to life. After reviewing the risks                            and benefits, the patient was deemed in                             satisfactory condition to undergo the procedure.                           After obtaining informed consent, the endoscope was                            passed under direct vision. Throughout the                            procedure, the patient's blood pressure, pulse, and                            oxygen saturations were monitored continuously. The                            [id number]-H190 ([id number] Olympus endoscope was introduced                            through the mouth, and advanced to the third part                            of duodenum. The upper GI endoscopy was                            accomplished without difficulty. The patient                            tolerated the procedure well. Scope In: Scope Out: Findings:      Esophagogastric landmarks were identified: the Z-line was found at 40       cm, the gastroesophageal junction was found at 40 cm and the upper       extent of the gastric folds was found at 41 cm from the incisors. Z line       was irregular but did not meet criteria for Barrett's biopsies.      A 1 cm hiatal hernia was present.      The exam of the esophagus was otherwise normal.      The entire examined stomach was normal. No blood or heme noted anywhere.  A single large sessile polypoid lesion was found in the third portion of       the duodenum. It did not appear ulcerated nor have any stigmata for       bleeding. Biopsies were taken with a cold forceps for histology.      The exam of the duodenum was otherwise normal. No blood or heme. Impression:               - Esophagogastric landmarks identified.                           - 1 cm hiatal hernia.                           - Normal esophagus otherwise                           - Normal stomach.                           - A large duodenal polypoid lesion. Biopsied.                           - Normal duodenum otherwise.                           No heme or source of bleeding on  EGD. Suspect he is                            having a lower GI bleed. He is a poor candidate for                            CTA given his renal disease and that he does not                            want dialysis. He has never had a prior                            colonoscopy, that is the next step if he can handle                            the prep, vs. tagged RBC scan to help localize. Recommendation:           - Return patient to hospital ward for ongoing care.                           - Clear liquid diet.                           - Continue present medications.                           - Repeat CBC now, keep Hgb > 8                           - Colonoscopy tomorrow  if patient can tolerate                            bowel prep (per nephrology they want to avoid preps                            if possible). Will discuss with nephrology and                            primary service and patient next steps                           - Continue to hold Eliquis                           - Await pathology results.                           - GI service will continue to follow Procedure Code(s):        --- Professional ---                           [phone], Esophagogastroduodenoscopy, flexible,                            transoral; with biopsy, single or multiple Diagnosis Code(s):        --- Professional ---                           K44.9, Diaphragmatic hernia without obstruction or                            gangrene                           K31.7, Polyp of stomach and duodenum                           K92.2, Gastrointestinal hemorrhage, unspecified CPT copyright 2022 American Medical Association. All rights reserved. The codes documented in this report are preliminary and upon coder review may  be revised to meet current compliance requirements. Remo Lipps P. Yarieliz Wasser, MD 03/03/2022 12:47:30 PM This report has been signed electronically. Number of Addenda: 0

## 2022-02-24 NOTE — Progress Notes (Signed)
PROGRESS NOTE  Daniel Myers  DOB: 01-13-1946  PCP: Claretta Fraise, MD QMG:[id number]  DOA: 02/27/2022  LOS: 2 days  Hospital Day: 3  Brief narrative: Daniel Myers is a 77 y.o. 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  In the ED, patient was noted to have a hemoglobin low at 5.5, creatinine elevated to 4.93 (up from 2.69 two weeks ago), INR 2.3, sodium 123 2 units of PRBC transfusion given Admitted to [facility] Nephrology and GI consulted Per recommendation, patient was transferred to [facility] on 1/16.  Subjective: Patient was seen and examined this morning.  Lying down in bed.  Being prepared for endoscopy.  Daughter was at bedside who I had the  conversation mostly with. Chart reviewed In the last 24 hours, no fever, hemodynamically stable, breathing on room air Last set of labs from this morning showed sodium low at 128, serum bicarb low at 15, BUN/creatinine 106/4.44, hemoglobin 9.7, platelet 98  Underwent EGD this afternoon.  See below.  Assessment and plan: Acute GI bleeding Present with nausea, vomiting, bloody diarrhea in the setting of Eliquis use Eliquis on hold.  Currently on Protonix infusion  seen by [facility] in [place].  Recommended transfer to [facility] for endoscopy evaluation because of underlying severe cardiac disease. Early this afternoon, patient had EGD, noted to have a large polypoid lesion in the duodenum likely nonmalignant.  On table, he started to have burgundy colored blood per rectum suggestive of a lower GI bleeding.  IR embolization could not be considered because of his poor renal function and high risk of dialysis requirement after that.  In the past,  patient has repeatedly refused to be on dialysis. GI patient as well as ICU physician talked to family.  Family does not want any further aggressive measures done for him.  But they are okay with blood transfusion.  Acute on chronic blood loss anemia Due to GI bleeding It seems that for the last few months, her baseline hemoglobin is running low between 9 and 10.  May have some component of chronic GI bleeding. Presented this time with low hemoglobin of 5.5.  Received 4 units of PRBC transfusion so far.  Hemoglobin this morning 9.7. He is actively bleeding in the endoscopy suite this afternoon.  2 units of emergent PRBC transfusion ordered. Recent Labs    11/20/21 0416 11/20/21 0509 02/12/2022 1040 02/23/22 0617 02/23/22 1111 02/23/22 2109 03/08/2022 0111  HGB  --    < > 5.5* 7.9* 7.0* 9.4* 9.7*  MCV  --    < > 92.3 93.4  --   --  87.9  FERRITIN 63  --   --   --   --   --   --   TIBC 222*  --   --   --   --   --   --   IRON 17*  --   --   --   --   --   --    < > = values in this interval not displayed.    Recent UTI Urinanalysis significant for pyuria, but no significant findings of infection noted Urine culture grew E. coli 40,000 CFU per mL which is  insignificant.  But probably needs treatment because of immunocompromised status.  Start IV Rocephin.  AKI on CKD stage IV/renal transplant Baseline creatinine 2.69 from 02/10/2022.  Presented with creatinine elevated to 4.93, in the setting of blood loss and hypotension.  Also reports recent UTI and use of antibiotics.  Nephrology consulted.  Creatinine gradually improving as below. Continue immunosuppressants: Prograf, CellCept Recent Labs    01/01/22 0555 01/02/22 0057 01/03/22 0056 01/04/22 0044 01/05/22 0050 01/18/22 1350 02/10/22 1154 03/08/2022 1040 02/23/22 0617 02/23/2022 0111  BUN 44* 53* 61* 71* 69* 71* 63* 112* 112* 106*  CREATININE 2.20* 2.14* 2.35* 2.48* 2.34* 2.93* 2.69* 4.93* 4.95* 4.44*   Chronic combined  CHF History of hypertension Last echo from 11/2021 showed EF 20-25%%  overall seems hypervolemic due to pitting edema.  Probably has significant spacing and hypotension. PTA on Verquvo  metoprolol succinate 25 mg daily, torsemide 40 mg daily.  Currently on hold  Hypervolemic hyponatremia In the setting of significant systolic dysfunction and AKI. Recent Labs  Lab 03/07/2022 1040 02/23/22 0617 02/21/2022 0111  NA 123* 128* 128*    Hyperkalemia  hypermagnesemia Potassium level was a little elevated to 5.4 in the setting of AKI.  Improved with Lokelma.  Currently resolved with Lokelma Continue to monitor Recent Labs  Lab 03/01/2022 1040 02/23/22 0617 02/25/2022 0111  K 5.4* 5.1 5.1  MG  --  2.6* 2.6*    Type 2 diabetes mellitus A1c 5.8 on 12/14/2021.  Suspect falsely low because of chronic anemia.   PTA on Novolin NPH 10 units twice daily and regular insulin twice daily Currently on sliding scale with Accu-Cheks Recent Labs  Lab 02/23/22 2343 02/15/2022 0409 02/20/2022 0811 02/15/2022 1054 02/20/2022 1208  GLUCAP 127* 96 114* 158* 159*   Severe multivessel CAD Cath 8/22 and patient declined CABG, PCI felt to be of limited benefit.   PTA on Eliquis, statin.  Continue statin and Zetia.  Eliquis plan as above   Paroxysmal atrial fibrillation Continue amiodarone 100 mg daily.  Metoprolol on hold because of soft blood pressure reading Monitor on telemetry. Not interested in ablation Currently anticoagulated on Eliquis.  Currently on hold because of GI bleeding.  Thrombocytopenia stable Likely related to mycophenolate.  Continue to monitor Recent Labs  Lab 03/07/2022 1040 02/23/22 0617 02/23/22 1111 02/23/22 2109 02/10/2022 0111  WBC 3.7* 4.5  --   --  4.0  NEUTROABS 2.5  --   --   --   --   HGB 5.5* 7.9* 7.0* 9.4* 9.7*  HCT 18.1* 25.4* 21.9* 27.9* 29.1*  MCV 92.3 93.4  --   --  87.9  PLT 99* 95*  --   --  98*   Anxiety PTA on Xanax as needed   Goals of care/general failure to  thrive Palliative evaluation appreciated   Goals of care   Code Status: DNR    DVT prophylaxis:  SCDs Start: 03/05/2022 1339   Antimicrobials: None Fluid: Blood transfusion Consultants: GI, ICU, nephrology Family Communication: I discussed with daughter this morning.  ICU and GI physician discussed with her later after the endoscopy earlier this afternoon.  Scheduled Meds:  [MAR Hold] sodium chloride   Intravenous Once   sodium chloride   Intravenous Once   [MAR Hold] amiodarone  100 mg Oral Daily   [MAR Hold] atorvastatin  40 mg Oral Daily   [MAR Hold] ezetimibe  10 mg Oral Daily   [MAR Hold] insulin aspart  0-15 Units Subcutaneous Q4H   [MAR Hold]  mupirocin ointment   Nasal BID   [MAR Hold] mycophenolate  250 mg Oral BID   [MAR Hold] predniSONE  5 mg Oral Q breakfast   [MAR Hold] sodium chloride flush  3 mL Intravenous Q12H   [MAR Hold] tacrolimus  0.5 mg Oral BID   [MAR Hold] Vericiguat  2.5 mg Oral Daily    PRN meds: [MAR Hold] sodium chloride, [MAR Hold] acetaminophen **OR** [MAR Hold] acetaminophen, [MAR Hold] albuterol, [MAR Hold] ALPRAZolam, [MAR Hold] fluticasone, [MAR Hold] loratadine, [MAR Hold] ondansetron **OR** [MAR Hold] ondansetron (ZOFRAN) IV, [MAR Hold] sodium chloride flush   Infusions:   [MAR Hold] sodium chloride     [MAR Hold] cefTRIAXone (ROCEPHIN)  IV 1 g (03/07/2022 1023)   desmopressin (DDAVP) 20 mcg in sodium chloride 0.9 % 50 mL IVPB     pantoprazole 8 mg/hr (02/23/22 0748)    Antimicrobials: Anti-infectives (From admission, onward)    Start     Dose/Rate Route Frequency Ordered Stop   02/17/2022 1000  [MAR Hold]  cefTRIAXone (ROCEPHIN) 1 g in sodium chloride 0.9 % 100 mL IVPB        (MAR Hold since Wed 02/08/2022 at 1141.Hold Reason: Transfer to a Procedural area)   1 g 200 mL/hr over 30 Minutes Intravenous Every 24 hours 03/07/2022 7408         Skin assessment:      Diet:  Diet Order             Diet NPO time specified  Diet effective  now                   Nutritional status: Body mass index is 29.89 kg/m.           Status is: Inpatient Level of care: Progressive  Continue in-hospital care because: Blood transfusion  Dispo: The patient is from: Home              Anticipated d/c is to: Poor prognosis              Patient currently is not medically stable to d/c.   Difficult to place patient No   Objective: Vitals:   02/18/2022 1340 02/15/2022 1350  BP: (!) 103/51 (!) 103/51  Pulse: 87 85  Resp: 20 (!) 24  Temp: 97.8 F (36.6 C)   SpO2: (P) 100% 100%    Intake/Output Summary (Last 24 hours) at 02/20/2022 1355 Last data filed at 02/25/2022 1353 Gross per 24 hour  Intake 1759 ml  Output 450 ml  Net 1309 ml   Filed Weights   02/23/2022 1009 02/23/22 1808 02/11/2022 0408  Weight: 89.4 kg 94 kg 94.5 kg   Weight change: 4.641 kg Body mass index is 29.89 kg/m.   Physical Exam this morning: General exam: Pleasant, confused,  Skin: No rashes, lesions or ulcers. HEENT: Atraumatic, normocephalic, no obvious bleeding Lungs: Clear to auscultation bilaterally CVS: Regular rate and rhythm, no murmur GI/Abd soft, nontender, nondistended, bowel sound present CNS: Alert, awake, slow to respond Psychiatry: Mood appropriate Extremities: No pedal edema, no calf tenderness  Data Review: I have personally reviewed the laboratory data and studies available.  F/u labs ordered Unresulted Labs (From admission, onward)     Start     Ordered   03/06/2022 1301  Prepare RBC (crossmatch)  (Blood Administration Adult)  Once,   R       Question Answer Comment  # of Units 2 units   Transfusion Indications Hemoglobin < 7 gm/dL and  symptomatic   Transfusion Indications Acute blood loss with shock   Number of Units to Keep Ahead NO units ahead   If emergent release call blood bank [facility] [phone]      03/04/2022 1302   Pending  Type and screen [place]  Once,   STAT       Comments: [place]    Pending            Total time spent in review of labs and imaging, patient evaluation, formulation of plan, documentation and communication with family:  3 minutes  Signed, Terrilee Croak, MD [facility] 02/25/2022

## 2022-02-24 NOTE — Transfer of Care (Signed)
Immediate Anesthesia Transfer of Care Note  Patient: Daniel Myers  Procedure(s) Performed: ESOPHAGOGASTRODUODENOSCOPY (EGD) WITH PROPOFOL BIOPSY  Patient Location: Endoscopy Unit  Anesthesia Type:MAC  Level of Consciousness: awake  Airway & Oxygen Therapy: Patient Spontanous Breathing and Patient connected to nasal cannula oxygen  Post-op Assessment: Report given to RN and Post -op Vital signs reviewed and stable  Post vital signs: Reviewed and stable  Last Vitals:  Vitals Value Taken Time  BP 95/57 02/18/2022 1321  Temp    Pulse 94 02/18/2022 1324  Resp 26 02/28/2022 1324  SpO2 100 % 02/12/2022 1324  Vitals shown include unvalidated device data.  Last Pain:  Vitals:   03/05/2022 1145  TempSrc: Temporal  PainSc: 0-No pain         Complications: No notable events documented.

## 2022-02-24 NOTE — Progress Notes (Signed)
Patient ID: Daniel Myers, male   DOB: January 01, 1946, 77 y.o.   MRN: [id number] S: Transferred from [facility] to [facility] for endoscopy.  Feels better this morning but wants to sit up on the edge of the bed due to back pain. O:BP (!) 117/58 (BP Location: Left Arm)   Pulse 70   Temp (!) 97.4 F (36.3 C) (Oral)   Resp 18   Ht '5\' 10"'$  (1.778 m)   Wt 94.5 kg   SpO2 96%   BMI 29.89 kg/m   Intake/Output Summary (Last 24 hours) at 03/06/2022 0916 Last data filed at 02/18/2022 0800 Gross per 24 hour  Intake 1087 ml  Output 450 ml  Net 637 ml   Intake/Output: I/O last 3 completed shifts: In: 1725.7 [P.O.:480; I.V.:158.7; Blood:1087] Out: 450 [Urine:450]  Intake/Output this shift:  Total I/O In: 120 [P.O.:120] Out: -  Weight change: 4.641 kg Gen: NAD CVS: RRR Resp:CTA Abd: +BS, soft, NT/ND Ext: no edema  Recent Labs  Lab 03/10/2022 1040 02/23/22 0617 02/17/2022 0111  NA 123* 128* 128*  K 5.4* 5.1 5.1  CL 95* 99 100  CO2 13* 17* 15*  GLUCOSE 210* 106* 112*  BUN 112* 112* 106*  CREATININE 4.93* 4.95* 4.44*  ALBUMIN 2.6* 2.5* 2.5*  CALCIUM 6.9* 7.0* 7.3*  AST '22 20 20  '$ ALT '16 17 18   '$ Liver Function Tests: Recent Labs  Lab 02/16/2022 1040 02/23/22 0617 02/25/2022 0111  AST '22 20 20  '$ ALT '16 17 18  '$ ALKPHOS 55 56 64  BILITOT 0.8 1.2 1.5*  PROT 4.8* 4.7* 4.7*  ALBUMIN 2.6* 2.5* 2.5*   Recent Labs  Lab 02/27/2022 1040  LIPASE 22   No results for input(s): "AMMONIA" in the last 168 hours. CBC: Recent Labs  Lab 02/17/2022 1040 02/23/22 0617 02/23/22 1111 02/23/22 2109 02/23/2022 0111  WBC 3.7* 4.5  --   --  4.0  NEUTROABS 2.5  --   --   --   --   HGB 5.5* 7.9* 7.0* 9.4* 9.7*  HCT 18.1* 25.4* 21.9* 27.9* 29.1*  MCV 92.3 93.4  --   --  87.9  PLT 99* 95*  --   --  98*   Cardiac Enzymes: No results for input(s): "CKTOTAL", "CKMB", "CKMBINDEX", "TROPONINI" in the last 168 hours. CBG: Recent Labs  Lab 02/23/22 2022 02/23/22 2305 02/23/22 2343 03/05/2022 0409 02/14/2022 0811  GLUCAP  193* 137* 127* 96 114*    Iron Studies: No results for input(s): "IRON", "TIBC", "TRANSFERRIN", "FERRITIN" in the last 72 hours. Studies/Results: No results found.  sodium chloride   Intravenous Once   amiodarone  100 mg Oral Daily   atorvastatin  40 mg Oral Daily   ezetimibe  10 mg Oral Daily   insulin aspart  0-15 Units Subcutaneous Q4H   mupirocin ointment   Nasal BID   mycophenolate  250 mg Oral BID   predniSONE  5 mg Oral Q breakfast   sodium chloride flush  3 mL Intravenous Q12H   tacrolimus  0.5 mg Oral BID   Vericiguat  2.5 mg Oral Daily    BMET    Component Value Date/Time   NA 128 (L) 02/21/2022 0111   NA 139 02/10/2022 1154   K 5.1 02/15/2022 0111   CL 100 03/01/2022 0111   CO2 15 (L) 03/04/2022 0111   GLUCOSE 112 (H) 02/25/2022 0111   BUN 106 (H) 03/04/2022 0111   BUN 63 (H) 02/10/2022 1154   CREATININE 4.44 (H) 02/27/2022 0111  CALCIUM 7.3 (L) 03/10/2022 0111   GFRNONAA 13 (L) 03/01/2022 0111   GFRAA 49 (L) 04/03/2020 1027   CBC    Component Value Date/Time   WBC 4.0 02/14/2022 0111   RBC 3.31 (L) 03/10/2022 0111   HGB 9.7 (L) 02/10/2022 0111   HGB 9.0 (L) 02/10/2022 1154   HCT 29.1 (L) 02/28/2022 0111   HCT 29.2 (L) 02/10/2022 1154   PLT 98 (L) 02/21/2022 0111   PLT 71 (LL) 02/10/2022 1154   MCV 87.9 02/25/2022 0111   MCV 89 02/10/2022 1154   MCH 29.3 02/20/2022 0111   MCHC 33.3 02/12/2022 0111   RDW 16.8 (H) 02/23/2022 0111   RDW 15.0 02/10/2022 1154   LYMPHSABS 0.6 (L) 03/06/2022 1040   LYMPHSABS 0.6 (L) 02/10/2022 1154   MONOABS 0.3 02/19/2022 1040   EOSABS 0.1 03/05/2022 1040   EOSABS 0.0 02/10/2022 1154   BASOSABS 0.0 02/11/2022 1040   BASOSABS 0.0 02/10/2022 1154     Assessment/Plan:  AKI/CKD stage IV, non-oliguric - 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  Baseline Scr 2-3 followed by Dr. Moshe Cipro in our office.  Presented with AKI/CKD stage  IV in setting of hypotension and ABLA with bloody diarrhea.  He also had pyuria.  Kidney imaging without hydronephrosis.  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  Scr peaked at 4.95 but improved to 4.44 today after blood transfusion and improved bp.  He again confirms that he would not want to resume HD and has spoken with Palliative care and understands the consequences.  Thankfully Scr improving and no signs/symptoms of uremia.  Will continue to follow.   Avoid nephrotoxic medications including NSAIDs and iodinated intravenous contrast exposure unless the latter is absolutely indicated.   Preferred narcotic agents for pain control are hydromorphone, fentanyl, and methadone. Morphine should not be used.  Avoid Baclofen and avoid oral sodium phosphate and magnesium citrate based laxatives / bowel preps.  Continue strict Input and Output monitoring.  Will monitor the patient closely with you and intervene or adjust therapy as indicated by changes in clinical status/labs   ABLA - with bloody diarrhea.  S/p blood transfusions.  Transferred to [facility] for EGD.  Transfuse prn.  Hgb improved to 9.7. H/o DDKT- continue with home doses of prograf, cellcept, and prednisone. Pyuria - without bacteria, possible AIN from recent antibiotics.  Urine culture 40,000 CFU with E. Coli sensitive to everything except PCN.  Currently on ceftriaxone. Hyponatremia - improving somewhat with blood.  Edema has improved. HTN/Volume - bp improved and holding BB and diuretics for now. Thrombocytopenia - stable and chronic CHF, chronic combined - s/p blood transfusion and lasix.  Volume improved. CAD - severe multivessel.  Pt declined CABG. P. Atrial fib - off anticoagulation due to GIB  Disposition - palliative care following.  Currently DNR.   Donetta Potts, MD [facility]

## 2022-02-24 NOTE — Consult Note (Signed)
[facility] Consult: 9:15 AM 03/02/2022  LOS: 2 days    Referring Provider:  Dr Pietro Cassis  Primary Care Physician:  Claretta Fraise, MD Primary Gastroenterologist:  unassigned.  Came from [facility]       Reason for Consultation: GI bleed.   HPI: Daniel Myers is a 77 y.o. male.  Hx basal and squamous cell skin cancers.  ESRD, renal transplant.  Immunosuppression with tacrolimus, CellCept, 5 mg prednisone.  CKD.  Non-STEMI.  Atrial fibrillation.  Combined CHF.  EF 20 to 25%, grade 3 restrictive diastolic dysfunction by echo in October.  Chronic Eliquis, last dose 1/14.  IDDM.  Thrombocytopenia, platelets 97 K on 01/18/2022.  [place] Anemia with Hb nadir 8 in late October 2023 w low iron, low TIBC, low iron sats, normal ferritin.  Received 1 unit PRBCs in early November. Prescribed Augmentin a week ago for UTI. No prior EGD or colonoscopy.  03/2020 CT of chest showed cholelithiasis, normal esophagus. No PPI or H2 blocker PTA.  Presented to [facility] on 1/15.  2 to 3-day history of bloody diarrhea (vs burgundy/melenic stool) along with nausea and dry heaves with began once he arrived at [facility]..  Abdominal discomfort in the right lower/mid abdomen.  Last stool was a small amount this morning and it was burgundy/melenic. At baseline has brown stools daily.  No heartburn, no dysphagia.  Appetite chronically poor but no issues with nausea prior to last couple of days.  Follows fluid restricted diet. Previous blood transfusions in 2008 at the time of his kidney transplant which was a deceased donor organ.  Hgb 5.5, received PRBCs.  9.7 today, 3 days later. Platelets 71 K, 98 today. Low sodium 123..  Now 128. GFR 12 with BUNs/creatinine 112/4.9 T. bili 1.5 with normal alk phos, normal transaminases. No  abdominal/pelvic imaging obtained.  Due to multiple comorbidities, anesthesia providers at [facility] would not sedate the patient.  He waited almost a couple of days before a bed was available to transfer to [facility].  Arrived here overnight. Current medications includes 72 h Protonix drip ends on 1/18 at 11:45 AM.  Rocephin for UTI.  Widower x 15 years.  Lives in his own house, his daughter lives in the house next-door.  Smoked in the past, never heavily and has not smoked for many years.  No history of alcohol use.    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   nodular on left jawline - excision   Basal cell carcinoma 07/01/2014   nodular on left [place] - CX3+5FU+excision   Basal cell carcinoma 10/10/2014   nodular on right forearm, middle - tx p bx   Basal cell carcinoma 02/11/2015   left neck - CX3 + excision   Basal cell carcinoma 06/14/2016   left jawline - CX3+5FU   Basal cell carcinoma 02/22/2017   superficial and nodular on left neck - excision   Basal cell carcinoma 04/11/2018   superficial and nodular on right inferior forearm - CX3+Cautery+5FU   Chronic kidney  disease    History of renal transplant    Hyperlipidemia    Hypertension    NSTEMI (non-ST elevated myocardial infarction) ([place]) 02/13/2020   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   Left [place] (well diff)   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   Right Breast (in situ)   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   Left Forearm - anterior (mod diff)   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   Neck - anterior (mod diff)   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   in situ on left arm - CX3+5FU   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   hypertrophic on left ear - CX3+5FU   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   in situ on right temple - CX3+5FU   Squamous cell carcinoma of skin 11/08/2011   right upper forearm -  tx p bx   Squamous cell carcinoma of skin 11/08/2011   left upper forearm - tx p bx   Squamous cell carcinoma of skin 11/08/2011   left hand - tx p bx   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/27/2013   in situ on left lower back - CX3+5FU   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/27/2013   in situ on left forehead - CX3+5FU   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/27/2013   in situ on right temple - watch per ST   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/27/2013   in situ on right forearm - CX3+5FU   Squamous cell carcinoma of skin 07/01/2014   well differentiated on right sideburn - CX3+5FU   Squamous cell carcinoma of skin 07/01/2014   in situ on left shoulder - CX3+5FU   Squamous cell carcinoma of skin 07/01/2014   in situ on right forearm, proximal - CX3+5FU+Cautery   Squamous cell carcinoma of skin 07/01/2014   in situ on right forearm, distal - CX3+5FU   Squamous cell carcinoma of skin 09/30/2015   in situ on posterior left ear - CX3+5FU   Squamous cell carcinoma of skin 02/22/2017   in situ on right upper arm - CX3+5FU   Squamous cell carcinoma of skin 02/22/2017   in situ on left upper arm - CX3+5FU   Squamous cell carcinoma of skin 04/11/2018   in situ on right temple - CX3+5FU   Squamous cell carcinoma of skin 04/11/2018   in situ on lateral right arm - MOHs   Squamous cell carcinoma of skin 04/11/2018   in situ on right upper arm - MOHs   Squamous cell carcinoma of skin 04/11/2018   in situ on left sideburn   Squamous cell carcinoma of skin 04/11/2018   in situ on right flank - tx p bx   Squamous cell carcinoma of skin 09/28/2018   in situ on right inner ear - tx p bx   Squamous cell carcinoma of skin 09/28/2018   in situ on left inner ear - tx p bx   Squamous cell carcinoma of skin 09/28/2018   in situ on right arm - tx p bx   Squamous cell carcinoma of skin 03/21/2019   in situ on right antihelix ([place] treated topically)   Squamous cell carcinoma of skin 03/21/2019   in situ  on right neck - CX3+5FU   Squamous cell carcinoma of skin 03/21/2019   in situ on left outer eye, inf (MOHs done 05/23/2019)   Type 2 diabetes mellitus ([place])    Unspecified atrial  fibrillation ([place]) 02/17/2020    Past Surgical History:  Procedure Laterality Date   CARDIOVERSION N/A 10/16/2020   Procedure: CARDIOVERSION;  Surgeon: Larey Dresser, MD;  Location: [facility] ENDOSCOPY;  Service: Cardiovascular;  Laterality: N/A;   KIDNEY TRANSPLANT Right    RIGHT/LEFT HEART CATH AND CORONARY ANGIOGRAPHY N/A 09/09/2020   Procedure: RIGHT/LEFT HEART CATH AND CORONARY ANGIOGRAPHY;  Surgeon: Larey Dresser, MD;  Location: [place] CV LAB;  Service: Cardiovascular;  Laterality: N/A;    Prior to Admission medications   Medication Sig Start Date End Date Taking? Authorizing Provider  acetaminophen (TYLENOL) 500 MG tablet Take 500 mg by mouth every 8 (eight) hours as needed for moderate pain.   Yes [provider]  albuterol (VENTOLIN HFA) 108 (90 Base) MCG/ACT inhaler Inhale 2 puffs into the lungs every 6 (six) hours as needed for wheezing or shortness of breath. 11/13/21  Yes Stacks, Daniel Gash, MD  ALPRAZolam Duanne Moron) 0.25 MG tablet Take 1 tablet (0.25 mg total) by mouth at bedtime as needed for anxiety. 12/28/21  Yes Claretta Fraise, MD  amiodarone (PACERONE) 200 MG tablet Take 0.5 tablets (100 mg total) by mouth daily. 10/14/21  Yes Larey Dresser, MD  amoxicillin-clavulanate (AUGMENTIN) 875-125 MG tablet Take 1 tablet by mouth 2 (two) times daily. Take all of this medication 02/14/22  Yes Stacks, Daniel Gash, MD  atorvastatin (LIPITOR) 40 MG tablet Take 1 tablet (40 mg total) by mouth daily. 11/10/21  Yes Stacks, Daniel Gash, MD  ELIQUIS 5 MG TABS tablet Take 1 tablet by mouth twice daily 11/09/21  Yes Larey Dresser, MD  ezetimibe (ZETIA) 10 MG tablet Take 1 tablet (10 mg total) by mouth daily. 08/17/21  Yes Satira Sark, MD  fluticasone [facility]) 50 MCG/ACT nasal spray Place 1 spray into both nostrils  daily as needed for allergies. 12/21/21  Yes Johnson, Clanford L, MD  insulin NPH Human (NOVOLIN N) 100 UNIT/ML injection 10 units AC breakfast and 10 units AC supper 08/05/21  Yes Stacks, Daniel Gash, MD  insulin regular (NOVOLIN R) 100 units/mL injection 10 units w bkfst and 6 units w dinner 01/05/22  Yes Domenic Polite, MD  loratadine (CLARITIN) 10 MG tablet Take 10 mg by mouth daily as needed for allergies. 10/24/06  Yes [provider]  metoprolol succinate (TOPROL-XL) 25 MG 24 hr tablet Take 1 tablet (25 mg total) by mouth daily. Take with or immediately following a meal. 12/21/21  Yes Johnson, Clanford L, MD  mycophenolate (CELLCEPT) 250 MG capsule Take 250 mg by mouth 2 (two) times daily.   Yes [provider]  nitroGLYCERIN (NITROSTAT) 0.4 MG SL tablet Place 1 tablet (0.4 mg total) under the tongue every 5 (five) minutes as needed for chest pain. 11/05/20  Yes Larey Dresser, MD  ondansetron (ZOFRAN) 4 MG tablet TAKE 1 TABLET BY MOUTH EVERY 8 HOURS AS NEEDED FOR NAUSEA FOR VOMITING 01/24/22  Yes Stacks, Daniel Gash, MD  predniSONE (DELTASONE) 5 MG tablet Take 5 mg by mouth daily with breakfast.   Yes [provider]  tacrolimus (PROGRAF) 0.5 MG capsule Take 1 mg by mouth in the morning and at bedtime. 07/08/15  Yes [provider]  torsemide (DEMADEX) 20 MG tablet Take 2 tablets (40 mg total) by mouth daily. 01/26/22  Yes Milford, Jessica M, FNP  Vericiguat (VERQUVO) 2.5 MG TABS Take 2.5 mg by mouth daily. 12/01/21  Yes Larey Dresser, MD    Scheduled Meds:  sodium chloride   Intravenous Once   amiodarone  100 mg Oral Daily   atorvastatin  40 mg Oral Daily   ezetimibe  10 mg Oral Daily   insulin aspart  0-15 Units Subcutaneous Q4H   mupirocin ointment   Nasal BID   mycophenolate  250 mg Oral BID   predniSONE  5 mg Oral Q breakfast   sodium chloride flush  3 mL Intravenous Q12H   tacrolimus  0.5 mg Oral BID   Vericiguat  2.5 mg Oral Daily   Infusions:   sodium chloride     pantoprazole 8 mg/hr (02/23/22 0748)   PRN Meds: sodium chloride, acetaminophen **OR** acetaminophen, albuterol, ALPRAZolam, fluticasone, loratadine, ondansetron **OR** ondansetron (ZOFRAN) IV, sodium chloride flush   Allergies as of 02/23/2022 - Review Complete 03/08/2022  Allergen Reaction Noted   Other Rash and Palpitations 07/29/2014   Tape Rash 11/22/2018   Trazodone and nefazodone Palpitations 04/03/2020   Mirtazapine Other (See Comments) 04/03/2020   Elemental sulfur Rash 11/22/2018   Sulfa antibiotics Rash 10/29/2021    Family History  Problem Relation Age of Onset   Clotting disorder Mother    Hypertension Sister    Diabetes Sister     Social History   Socioeconomic History   Marital status: Widowed    Spouse name: Not on file   Number of children: 3   Years of education: Not on file   Highest education level: Not on file  Occupational History   Not on file  Tobacco Use   Smoking status: Some Days    Types: Pipe   Smokeless tobacco: Never  Vaping Use   Vaping Use: Never used  Substance and Sexual Activity   Alcohol use: Never   Drug use: Never   Sexual activity: Not Currently  Other Topics Concern   Not on file  Social History Narrative   Wife passed away in May 15, 2007.   2 daughters, 1 son. All live close by.    3 grandchildren.    Social Determinants of Health   Financial Resource Strain: Low Risk  (01/12/2022)   Overall Financial Resource Strain (CARDIA)    Difficulty of Paying Living Expenses: Not hard at all  Food Insecurity: No Food Insecurity (01/12/2022)   Hunger Vital Sign    Worried About Running Out of Food in the Last Year: Never true    Ran Out of Food in the Last Year: Never true  Transportation Needs: No Transportation Needs (01/12/2022)   PRAPARE - Hydrologist (Medical): No    Lack of Transportation (Non-Medical): No  Physical Activity: Insufficiently Active (04/15/2021)   Exercise Vital  Sign    Days of Exercise per Week: 7 days    Minutes of Exercise per Session: 20 min  Stress: No Stress Concern Present (01/12/2022)   [place]    Feeling of Stress : Not at all  Social Connections: Moderately Integrated (01/12/2022)   Social Connection and Isolation Panel [NHANES]    Frequency of Communication with Friends and Family: More than three times a week    Frequency of Social Gatherings with Friends and Family: More than three times a week    Attends Religious Services: 1 to 4 times per year    Active Member of [employer] or Organizations: No    Attends Archivist Meetings: 1 to 4 times per year    Marital Status: Widowed  Intimate Partner Violence: Not At Risk (01/12/2022)   Humiliation, Afraid, Rape, and Kick questionnaire  Fear of Current or Ex-Partner: No    Emotionally Abused: No    Physically Abused: No    Sexually Abused: No    REVIEW OF SYSTEMS: Constitutional: Malaise, feels weak ENT:  No nose bleeds Pulm: Cough productive of yellowish thick sputum CV:  No palpitations, no LE edema.  No angina GU:  No hematuria, no frequency.  No oliguria. GI: See HPI Heme: No unusual or excessive bleeding or bruising. Transfusions: See HPI. Neuro:  No headaches, no peripheral tingling or numbness.  No seizures, no syncope. Derm:  No itching, no rash or sores.  Endocrine:  No sweats or chills.  No polyuria or dysuria Immunization: Reviewed. Travel: Not queried.   PHYSICAL EXAM: Vital signs in last 24 hours: Vitals:   02/18/2022 0408 02/23/2022 0716  BP: (!) 116/55 (!) 117/58  Pulse:  70  Resp: 19 18  Temp: 97.6 F (36.4 C) (!) 97.4 F (36.3 C)  SpO2: 99% 96%   Wt Readings from Last 3 Encounters:  02/18/2022 94.5 kg  02/10/22 88 kg  01/26/22 88.7 kg    General: Patient looks chronically ill.  He is alert and able to provide background history. Head: No facial asymmetry or swelling.  No signs  of head trauma. Eyes: Conjunctiva is pale Ears: Slightly hard of hearing Nose: No congestion or discharge Mouth: Edentulous.  Tongue midline.  Mucosa moist, pink, clear. Neck: No masses, no JVD Lungs: Rales at the bases.  No dyspnea at rest.  Brief episode of coughing productive of thick yellow mucus Heart: RRR. Abdomen: Soft.  Minimal tenderness in the right mid to lower abdomen without guarding or rebound.  Active bowel sounds.  No HSM, masses, bruits, hernias..   Rectal: Deferred. Musc/Skeltl: No gross joint erythema or swelling Extremities: Slight nonpitting pedal edema. Neurologic: Oriented x 3.  Appropriate.  Moves all 4 limbs.  No tremor. Skin:  no rash, sores Nodes:  no cervical adenopathy.. Psych: Calm, cooperative.  Intake/Output from previous day: 01/16 0701 - 01/17 0700 In: 967 [P.O.:480; Blood:487] Out: 450 [Urine:450] Intake/Output this shift: Total I/O In: 120 [P.O.:120] Out: -   LAB RESULTS: Recent Labs    02/12/2022 1040 02/23/22 0617 02/23/22 1111 02/23/22 2109 02/13/2022 0111  WBC 3.7* 4.5  --   --  4.0  HGB 5.5* 7.9* 7.0* 9.4* 9.7*  HCT 18.1* 25.4* 21.9* 27.9* 29.1*  PLT 99* 95*  --   --  98*   BMET Lab Results  Component Value Date   NA 128 (L) 02/23/2022   NA 128 (L) 02/23/2022   NA 123 (L) 02/18/2022   K 5.1 03/01/2022   K 5.1 02/23/2022   K 5.4 (H) 03/03/2022   CL 100 02/17/2022   CL 99 02/23/2022   CL 95 (L) 02/23/2022   CO2 15 (L) 03/09/2022   CO2 17 (L) 02/23/2022   CO2 13 (L) 02/09/2022   GLUCOSE 112 (H) 03/04/2022   GLUCOSE 106 (H) 02/23/2022   GLUCOSE 210 (H) 02/12/2022   BUN 106 (H) 02/18/2022   BUN 112 (H) 02/23/2022   BUN 112 (H) 03/06/2022   CREATININE 4.44 (H) 02/11/2022   CREATININE 4.95 (H) 02/23/2022   CREATININE 4.93 (H) 02/15/2022   CALCIUM 7.3 (L) 03/10/2022   CALCIUM 7.0 (L) 02/23/2022   CALCIUM 6.9 (L) 03/08/2022   LFT Recent Labs    02/28/2022 1040 02/23/22 0617 02/19/2022 0111  PROT 4.8* 4.7* 4.7*  ALBUMIN  2.6* 2.5* 2.5*  AST '22 20 20  '$ ALT 16 17  18  ALKPHOS 55 56 64  BILITOT 0.8 1.2 1.5*   PT/INR Lab Results  Component Value Date   INR 2.3 (H) 02/12/2022   INR 1.5 (H) 01/01/2022   INR 1.5 (H) 11/16/2021   Hepatitis Panel No results for input(s): "HEPBSAG", "HCVAB", "HEPAIGM", "HEPBIGM" in the last 72 hours. C-Diff No components found for: "CDIFF" Lipase     Component Value Date/Time   LIPASE 22 02/12/2022 1040    Drugs of Abuse     Component Value Date/Time   LABOPIA NONE DETECTED 11/16/2021 2153   COCAINSCRNUR NONE DETECTED 11/16/2021 2153   LABBENZ POSITIVE (A) 11/16/2021 2153   AMPHETMU NONE DETECTED 11/16/2021 2153   THCU NONE DETECTED 11/16/2021 2153   LABBARB NONE DETECTED 11/16/2021 2153     RADIOLOGY STUDIES: No results found.    IMPRESSION:     GI bleed, suspect UGI.  Chronic Eliquis.  Last dose 1/14.  Status post renal transplant on multiple immunosuppressive meds.  Pyuria without bacteriuria, microscopic hematuria.  Urine specimen grew 40 K colonies of E. coli sensitive to the Rocephin he is receiving.    Coagulopathy.  INR was 2.3 on the 15th, 3 days ago.  This in setting of recent Eliquis.  Viewed chart and does not appear to have received vitamin K nor has INR been rechecked  Thrombocytopenia, noncritical, improving.  Hyponatremia, improved.  PLAN:       EGD today.  NPO, had clears at breakfast 1.5 or 2 h ago.    Continue the Protonix drip.    Stat INR, phlebotomy will need to collect specimen.  If the INR is deranged, will probably delay EGD until later today or tomorrow.  Consider ultrasound of abdomen to assess liver, will discuss with my attending Dr. Havery Moros.  Low platelets, hyponatremia, elevated INR raise suspicion for liver disease   Azucena Freed  02/08/2022, 9:15 AM Phone [phone]

## 2022-02-24 NOTE — Progress Notes (Signed)
Pt became nauseous after med pass. Pharmacy sent home medication up and family said to not worry about administering medication to pt as he wasn't feeling well. Pt's family also stated that he normally takes medication in the morning. Pt is resting in bed at lowest position with call bell in reach and family at bedside.

## 2022-02-24 NOTE — Progress Notes (Signed)
Just finished reassessing the patient and speaking with him as well as 2 of his family members. Pt has been nauseous and vomiting nonbloody material.  RN says it looks more like expectorant than actual GI sourced emesis.  Patient does not feel well.  No bowel movements. Patient looks exhausted and unwell.  Coloring is pale, ashen.  Remains hypotensive 90s/40s.  With heart rate in the 70s.  Tachypneic into the mid 20s but excellent sats on 4 L [place] O2  For now no plans for colonoscopy tomorrow, he seems at high risk for aspiration if we were to give him bowel prep which she probably would not be able to tolerate. Stopping the PPI drip when current bag finishes and will initiate oral Protonix tomorrow  Azucena Freed PA-c

## 2022-02-24 NOTE — Anesthesia Preprocedure Evaluation (Addendum)
Anesthesia Evaluation  Patient identified by MRN, date of birth, ID band Patient awake    Reviewed: Allergy & Precautions, NPO status , Patient's Chart, lab work & pertinent test results  Airway Mallampati: II  TM Distance: >3 FB Neck ROM: Full    Dental no notable dental hx.    Pulmonary Current Smoker and Patient abstained from smoking.   Pulmonary exam normal        Cardiovascular hypertension, Pt. on medications and Pt. on home beta blockers + CAD, + Past MI and +CHF   Rhythm:Regular Rate:Normal  ECHO  1. Left ventricular ejection fraction, by estimation, is 20 to 25%. The  left ventricle has severely decreased function. The left ventricle  demonstrates global hypokinesis. Left ventricular diastolic parameters are  consistent with Grade III diastolic  dysfunction (restrictive).   2. Right ventricular systolic function is mildly reduced. The right  ventricular size is normal.   3. Left atrial size was moderately dilated.   4. Right atrial size was moderately dilated.   5. The mitral valve is normal in structure. No evidence of mitral valve  regurgitation. No evidence of mitral stenosis.   6. The aortic valve is tricuspid. Aortic valve regurgitation is not  visualized. No aortic stenosis is present.   7. The inferior vena cava is normal in size with greater than 50%  respiratory variability, suggesting right atrial pressure of 3 mmHg.   Comparison(s): Prior images reviewed side by side. Prior EF described as  30-35%.     Neuro/Psych CVA    GI/Hepatic Neg liver ROS,,,GIB   Endo/Other  diabetes, Type 2, Insulin Dependent, Oral Hypoglycemic Agents    Renal/GU      Musculoskeletal   Abdominal Normal abdominal exam  (+)   Peds  Hematology  (+) Blood dyscrasia, anemia Lab Results      Component                Value               Date                      WBC                      4.0                 03/10/2022                 HGB                      9.7 (L)             03/07/2022                HCT                      29.1 (L)            02/23/2022                MCV                      87.9                02/23/2022                PLT  98 (L)              02/19/2022              Anesthesia Other Findings   Reproductive/Obstetrics                             Anesthesia Physical Anesthesia Plan  ASA: 4  Anesthesia Plan: MAC   Post-op Pain Management:    Induction: Intravenous  PONV Risk Score and Plan: Propofol infusion and Treatment may vary due to age or medical condition  Airway Management Planned: Simple Face Mask, Natural Airway and Nasal Cannula  Additional Equipment: None  Intra-op Plan:   Post-operative Plan:   Informed Consent: I have reviewed the patients History and Physical, chart, labs and discussed the procedure including the risks, benefits and alternatives for the proposed anesthesia with the patient or authorized representative who has indicated his/her understanding and acceptance.     Dental advisory given  Plan Discussed with: CRNA  Anesthesia Plan Comments:        Anesthesia Quick Evaluation

## 2022-02-24 NOTE — Anesthesia Postprocedure Evaluation (Signed)
Anesthesia Post Note  Patient: Daniel Myers  Procedure(s) Performed: ESOPHAGOGASTRODUODENOSCOPY (EGD) WITH PROPOFOL BIOPSY     Patient location during evaluation: Endoscopy Anesthesia Type: MAC Level of consciousness: awake Pain management: pain level controlled Vital Signs Assessment: post-procedure vital signs reviewed and stable Respiratory status: spontaneous breathing, nonlabored ventilation and respiratory function stable Cardiovascular status: blood pressure returned to baseline and stable Postop Assessment: no apparent nausea or vomiting Anesthetic complications: no   No notable events documented.  Last Vitals:  Vitals:   02/17/2022 1357 03/05/2022 1400  BP: (!) 98/45 (!) 99/46  Pulse: 77 78  Resp: (!) 24 (!) 24  Temp: 36.6 C   SpO2: 99% 100%    Last Pain:  Vitals:   03/10/2022 1400  TempSrc:   PainSc: 0-No pain                 Chrystle Murillo P Dayanis Bergquist

## 2022-02-24 NOTE — Progress Notes (Signed)
Palliative Care Progress Note   Patient Name: Daniel Myers       Date: 03/07/2022 DOB: August 21, 1945  Age: 77 y.o. MRN#: [id number] Attending Physician: Terrilee Croak, MD Primary Care Physician: Claretta Fraise, MD Admit Date: 02/19/2022  Consult received and reviewed.  I attempted to speak with patient but he was off of the unit in EGD.  Patient's daughter was at bedside.  Emotional support and introduction to palliative medicine provided.  PMT will continue to follow. Detailed note to follow once [place] discussion has been completed.   I plan on attempting to see the patient in continue goals of care discussions tomorrow, 1/18.  PMT contact info given and daughter encouraged to call with any palliative concerns or questions.  Thank you for allowing the Palliative Medicine Team to assist in the care of [facility].  Creston Ilsa Iha, FNP-BC Palliative Medicine Team Team Phone # [phone]  NO CHARGE

## 2022-02-24 NOTE — Progress Notes (Addendum)
Reassessed the patient this afternoon. Somnolent.  Pale Blood pressure 90s, heart rate 80s, on low-flow oxygen Multiple family members at bedside. Finished with 1 unit of PRBC transfusion Another just being started.  Family very well understands his critical situation and most likely natural outcome. Patient probably is continuing to bleed and may not have a chance despite 2 units of transfusion today.  Unable to given multiple back-to-back transfusions because of his severe CHF. Family clearly states that once the current unit of blood transfusion completes, they would want him made comfortable. I have discussed with bedside nurse. Once the blood transfusion is over, RN to page me. I will put in comfort care order at the time. Currently DNR As per family's wish, if he starts showing any discomfort, okay to use IV morphine as of now

## 2022-02-24 NOTE — Progress Notes (Signed)
GI UPDATE  EGD done today without any blood in the lumen noted anywhere.  He did have a large polypoid lesion in the duodenum which I think is likely an adenoma and not malignant, biopsies taken.  No ulcerative changes there is or stigmata for bleeding.  He had some burgundy colored blood per rectum before the exam and post exam had a lot more output.  I think he is having a likely lower GI bleed at this point and is quite active.  He does not appear that he would be able to tolerate a bowel prep, especially given the amount of blood coming out right now.  He is rather tachypneic.  Test of choice at this point is a CTA to localize and then consider IR embolziation.  In light of his renal function, this may likely lead to worsening and ultimately dialysis.  He states he adamantly does not want dialysis and is declining CTA right now.  Spoke with his daughter who support any decision he makes.  He understands that this could lead to exsanguination and death.  Very difficult situation given his kidney disease.  Family is coming in with the patient to determine goals of care and make sure they are all in the same page regarding his decision.  If he stabilizes enough for a colonoscopy and can do a prep we could try that tomorrow however given the amount of blood coming out now, unclear if that will be realistic.  If he decides to do a CTA would do that ASAP, perhaps he is having a diverticular bleed.  Plan on checking hemoglobin stat and giving blood to support him while family is making decision.  Spoke with Dr.Dahal a primary service and ICU team is coming to evaluate him as well.  Standing by to assist as needed.  Jolly Mango, MD [facility]

## 2022-02-24 NOTE — Consult Note (Signed)
NAME:  Daniel Myers, MRN:  [id number], DOB:  04-06-1945, LOS: 2 ADMISSION DATE:  02/27/2022, CONSULTATION DATE:  1/17 REFERRING MD:  Dr. 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  He was admitted to [facility] on 11/15 with complaints of hematochezia and abdominal pain.  Gastroenterology was consulted and the patient was offered endoscopy, however, due to his multiple comorbidities he was felt to to be safer for anesthesia at [facility].  He was transferred on 1/17 and underwent endoscopy.  No active bleeding or stigmata thereof was discovered on upper endoscopy but during the procedure he had significant bright red blood per rectum.  He became unstable and pulmonary critical care was asked to evaluate the patient in the setting of hemorrhagic shock.  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), 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), 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), 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), 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), Squamous cell carcinoma of skin (11/08/2011), Squamous cell  carcinoma of skin (11/08/2011), Squamous cell carcinoma of skin (11/08/2011), 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), 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), 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), 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), 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), 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), 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), 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), Squamous cell carcinoma of skin (09/30/2015), Squamous cell carcinoma of skin (02/22/2017), Squamous cell carcinoma of skin (02/22/2017), Squamous cell carcinoma of skin (04/11/2018), Squamous cell carcinoma of skin (04/11/2018), Squamous cell carcinoma of skin (04/11/2018), Squamous cell carcinoma of skin (04/11/2018), Squamous cell carcinoma of skin (04/11/2018), Squamous cell carcinoma of skin (09/28/2018), Squamous cell carcinoma of skin (09/28/2018), Squamous cell carcinoma of skin (09/28/2018), Squamous cell carcinoma of skin (03/21/2019), Squamous cell carcinoma of skin (03/21/2019), Squamous cell carcinoma of skin (03/21/2019), Type 2 diabetes mellitus ([place]), and Unspecified atrial fibrillation ([place]) (02/17/2020).   Significant Hospital Events: Including procedures, antibiotic start and stop dates in addition to other pertinent events   1/15 admit for GIB 1/17 endocsopy without source, significant blood loss per rectum PCCM consult  Interim History / Subjective:    Objective   Blood pressure (!) 120/102, pulse 83, temperature (!) 97.4 F (36.3 C), temperature source Temporal, resp. rate (!) 23, height '5\' 10"'$  (1.778 m), weight 94.5 kg, SpO2 100 %.        Intake/Output Summary (Last 24 hours) at 03/06/2022 1326 Last data filed at 02/23/2022 1238 Gross per 24 hour  Intake 1487 ml  Output 450 ml  Net 1037 ml   Filed Weights   02/14/2022 1009 02/23/22 1808 02/23/2022 0408  Weight: 89.4 kg 94 kg 94.5 kg    Examination: General: Overweight elderly male,  face pale HENT: [place]/AT, no JVD Lungs: Clear bilateral breath sounds, diminished bases Cardiovascular: Tachycardic, no MRG Abdomen: Soft,  nondistended, nontender Extremities: No acute deformity  Neuro: Somnolent but easily arousable  Resolved Hospital Problem list     Assessment & Plan:   Acute blood loss symptomatic anemia secondary to lower GI bleed.  Upper endoscopy on 1/17 without clear cause of significant bright red blood per rectum.  Patient became unstable postoperatively -Dr. Tacy Learn discussed with patient and family.  Patient would not want to be resuscitated and would not want to undergo any procedure which could potentially exacerbate his need for dialysis.  Patient refused CT angiogram to look for hemorrhagic focus.  Will plan to transfuse 2 units PRBC and evaluate response.  Should patient become unstable due to blood loss for volume overload as a result of transfusions we should proceed with comfort care per this discussion.  Please see Dr. Corey Skains attestation for further details. -Will give a dose of DDAVP in setting of bleeding and uremia -Eliquis has been held since 02/2012, no benefit to reversing -Continue PPI -GI following  Best Practice (right click and "Reselect all SmartList Selections" daily)   Diet/type: NPO DVT prophylaxis: not indicated GI prophylaxis: PPI Lines: N/A Foley:  N/A Code Status:  DNR Last date of multidisciplinary goals of care discussion [Dr. Tacy Learn discussed with family 1/17]  Labs   CBC: Recent Labs  Lab 02/11/2022 1040 02/23/22 0617 02/23/22 1111 02/23/22 2109 02/15/2022 0111  WBC 3.7* 4.5  --   --  4.0  NEUTROABS 2.5  --   --   --   --   HGB 5.5* 7.9* 7.0* 9.4* 9.7*  HCT 18.1* 25.4* 21.9* 27.9* 29.1*  MCV 92.3 93.4  --   --  87.9  PLT 99* 95*  --   --  98*    Basic Metabolic Panel: Recent Labs  Lab 03/02/2022 1040 02/23/22 0617 02/19/2022 0111  NA 123* 128* 128*  K 5.4* 5.1 5.1  CL 95* 99 100  CO2 13* 17* 15*  GLUCOSE 210* 106*  112*  BUN 112* 112* 106*  CREATININE 4.93* 4.95* 4.44*  CALCIUM 6.9* 7.0* 7.3*  MG  --  2.6* 2.6*   GFR: Estimated Creatinine Clearance: 16.6 mL/min (A) (by C-G formula based on SCr of 4.44 mg/dL (H)). Recent Labs  Lab 03/06/2022 1040 02/23/22 0617 02/21/2022 0111  WBC 3.7* 4.5 4.0    Liver Function Tests: Recent Labs  Lab 03/02/2022 1040 02/23/22 0617 02/20/2022 0111  AST '22 20 20  '$ ALT '16 17 18  '$ ALKPHOS 55 56 64  BILITOT 0.8 1.2 1.5*  PROT 4.8* 4.7* 4.7*  ALBUMIN 2.6* 2.5* 2.5*   Recent Labs  Lab 02/23/2022 1040  LIPASE 22   No results for input(s): "AMMONIA" in the last 168 hours.  ABG    Component Value Date/Time   HCO3 26.5 09/09/2020 0948   HCO3 27.0 09/09/2020 0948   TCO2 21 (L) 01/01/2022 0555   O2SAT 68.0 09/09/2020 0948   O2SAT 69.0 09/09/2020 0948     Coagulation Profile: Recent Labs  Lab 03/01/2022 1231 02/14/2022 1102  INR 2.3* 1.7*    Cardiac Enzymes: No results for input(s): "CKTOTAL", "CKMB", "CKMBINDEX", "TROPONINI" in the last 168 hours.  HbA1C: HB A1C (BAYER DCA - WAIVED)  Date/Time Value Ref Range Status  11/10/2021 09:59 AM 6.2 (H) 4.8 - 5.6 % Final    Comment:             Prediabetes: 5.7 - 6.4          Diabetes: >6.4  Glycemic control for adults with diabetes: <7.0   08/05/2021 10:04 AM 6.8 (H) 4.8 - 5.6 % Final    Comment:             Prediabetes: 5.7 - 6.4          Diabetes: >6.4          Glycemic control for adults with diabetes: <7.0    Hgb A1c MFr Bld  Date/Time Value Ref Range Status  12/14/2021 11:01 AM 5.8 (H) 4.8 - 5.6 % Final    Comment:    (NOTE) Pre diabetes:          5.7%-6.4%  Diabetes:              >6.4%  Glycemic control for   <7.0% adults with diabetes     CBG: Recent Labs  Lab 02/23/22 2343 03/01/2022 0409 02/28/2022 0811 02/18/2022 1054 02/14/2022 1208  GLUCAP 127* 96 114* 158* 159*    Review of Systems:   Patient is encephalopathic and/or intubated. Therefore history has been obtained from  chart review.   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), 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), 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), 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), 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), Squamous cell carcinoma of skin (11/08/2011), Squamous cell carcinoma of skin (11/08/2011), Squamous cell carcinoma of skin (11/08/2011), 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), 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), 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), 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), 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), 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), 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), 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), Squamous cell carcinoma of skin (09/30/2015), Squamous cell carcinoma of skin (02/22/2017), Squamous cell carcinoma of skin (02/22/2017), Squamous cell carcinoma of skin (04/11/2018), Squamous cell carcinoma of skin (04/11/2018), Squamous cell carcinoma of skin (04/11/2018), Squamous cell carcinoma of skin (04/11/2018), Squamous cell carcinoma of skin (04/11/2018), Squamous cell carcinoma of skin (09/28/2018), Squamous cell carcinoma of skin (09/28/2018), Squamous cell carcinoma of skin (09/28/2018), Squamous cell carcinoma of skin (03/21/2019), Squamous cell carcinoma of skin (03/21/2019), Squamous cell carcinoma  of skin (03/21/2019), Type 2 diabetes mellitus ([place]), and Unspecified atrial fibrillation ([place]) (02/17/2020).   Surgical History:   Past Surgical History:  Procedure Laterality Date   CARDIOVERSION N/A 10/16/2020   Procedure: CARDIOVERSION;  Surgeon: Larey Dresser, MD;  Location: [facility] ENDOSCOPY;  Service: Cardiovascular;  Laterality: N/A;   KIDNEY TRANSPLANT Right    RIGHT/LEFT HEART CATH AND CORONARY ANGIOGRAPHY N/A 09/09/2020   Procedure: RIGHT/LEFT HEART CATH AND CORONARY ANGIOGRAPHY;  Surgeon: Larey Dresser, MD;  Location: [facility] CV LAB;  Service: Cardiovascular;  Laterality: N/A;     Social History:   reports that he has been smoking pipe. He has never used smokeless tobacco. He reports that he does not drink alcohol and does not use drugs.   Family History:  His family history includes Clotting disorder in his mother; Diabetes in his sister; Hypertension in his sister.   Allergies Allergies  Allergen Reactions   Other Rash and Palpitations  Use paper tape.  tachycardia   Tape Rash    Use paper tape.    Trazodone And Nefazodone Palpitations    tachycardia   Mirtazapine Other (See Comments)    imbalance   Elemental Sulfur Rash   Sulfa Antibiotics Rash     Home Medications  Prior to Admission medications   Medication Sig Start Date End Date Taking? Authorizing Provider  acetaminophen (TYLENOL) 500 MG tablet Take 500 mg by mouth every 8 (eight) hours as needed for moderate pain.   Yes [provider]  albuterol (VENTOLIN HFA) 108 (90 Base) MCG/ACT inhaler Inhale 2 puffs into the lungs every 6 (six) hours as needed for wheezing or shortness of breath. 11/13/21  Yes Stacks, [name], MD  ALPRAZolam Duanne Moron) 0.25 MG tablet Take 1 tablet (0.25 mg total) by mouth at bedtime as needed for anxiety. 12/28/21  Yes Claretta Fraise, MD  amiodarone (PACERONE) 200 MG tablet Take 0.5 tablets (100 mg total) by mouth daily. 10/14/21  Yes Larey Dresser, MD   amoxicillin-clavulanate (AUGMENTIN) 875-125 MG tablet Take 1 tablet by mouth 2 (two) times daily. Take all of this medication 02/14/22  Yes Stacks, [name], MD  atorvastatin (LIPITOR) 40 MG tablet Take 1 tablet (40 mg total) by mouth daily. 11/10/21  Yes Stacks, [name], MD  ELIQUIS 5 MG TABS tablet Take 1 tablet by mouth twice daily 11/09/21  Yes Larey Dresser, MD  ezetimibe (ZETIA) 10 MG tablet Take 1 tablet (10 mg total) by mouth daily. 08/17/21  Yes Satira Sark, MD  fluticasone [facility] 50 MCG/ACT nasal spray Place 1 spray into both nostrils daily as needed for allergies. 12/21/21  Yes Johnson, Clanford L, MD  insulin NPH Human (NOVOLIN N) 100 UNIT/ML injection 10 units AC breakfast and 10 units AC supper 08/05/21  Yes Stacks, [name], MD  insulin regular (NOVOLIN R) 100 units/mL injection 10 units w bkfst and 6 units w dinner 01/05/22  Yes Domenic Polite, MD  loratadine (CLARITIN) 10 MG tablet Take 10 mg by mouth daily as needed for allergies. 10/24/06  Yes [provider]  metoprolol succinate (TOPROL-XL) 25 MG 24 hr tablet Take 1 tablet (25 mg total) by mouth daily. Take with or immediately following a meal. 12/21/21  Yes Johnson, Clanford L, MD  mycophenolate (CELLCEPT) 250 MG capsule Take 250 mg by mouth 2 (two) times daily.   Yes [provider]  nitroGLYCERIN (NITROSTAT) 0.4 MG SL tablet Place 1 tablet (0.4 mg total) under the tongue every 5 (five) minutes as needed for chest pain. 11/05/20  Yes Larey Dresser, MD  ondansetron (ZOFRAN) 4 MG tablet TAKE 1 TABLET BY MOUTH EVERY 8 HOURS AS NEEDED FOR NAUSEA FOR VOMITING 01/24/22  Yes Stacks, [name], MD  predniSONE (DELTASONE) 5 MG tablet Take 5 mg by mouth daily with breakfast.   Yes [provider]  tacrolimus (PROGRAF) 0.5 MG capsule Take 1 mg by mouth in the morning and at bedtime. 07/08/15  Yes [provider]  torsemide (DEMADEX) 20 MG tablet Take 2 tablets (40 mg total) by mouth daily. 01/26/22  Yes  Milford, Jessica M, FNP  Vericiguat (VERQUVO) 2.5 MG TABS Take 2.5 mg by mouth daily. 12/01/21  Yes Larey Dresser, MD     Critical care time: 38 minutes     Georgann Housekeeper, AGACNP-BC [facility]  See Amion for personal pager PCCM on call pager [phone] until 7pm. Please call Elink 7p-7a. [phone]  03/01/2022 1:48 PM

## 2022-02-25 DIAGNOSIS — Z515 Encounter for palliative care: Secondary | ICD-10-CM | POA: Diagnosis not present

## 2022-02-25 DIAGNOSIS — Z66 Do not resuscitate: Secondary | ICD-10-CM | POA: Diagnosis not present

## 2022-02-25 DIAGNOSIS — D62 Acute posthemorrhagic anemia: Secondary | ICD-10-CM | POA: Diagnosis not present

## 2022-02-25 DIAGNOSIS — K922 Gastrointestinal hemorrhage, unspecified: Secondary | ICD-10-CM | POA: Diagnosis not present

## 2022-02-25 LAB — CBC
HCT: 33.9 % — ABNORMAL LOW (ref 39.0–52.0)
Hemoglobin: 11.5 g/dL — ABNORMAL LOW (ref 13.0–17.0)
MCH: 29.3 pg (ref 26.0–34.0)
MCHC: 33.9 g/dL (ref 30.0–36.0)
MCV: 86.3 fL (ref 80.0–100.0)
Platelets: 86 10*3/uL — ABNORMAL LOW (ref 150–400)
RBC: 3.93 MIL/uL — ABNORMAL LOW (ref 4.22–5.81)
RDW: 17.3 % — ABNORMAL HIGH (ref 11.5–15.5)
WBC: 22.8 10*3/uL — ABNORMAL HIGH (ref 4.0–10.5)
nRBC: 0 % (ref 0.0–0.2)

## 2022-02-25 LAB — TYPE AND SCREEN
ABO/RH(D): A POS
Antibody Screen: NEGATIVE
Unit division: 0
Unit division: 0

## 2022-02-25 LAB — BPAM RBC
Blood Product Expiration Date: 202402112359
Blood Product Expiration Date: 202402112359
ISSUE DATE / TIME: 202401171308
ISSUE DATE / TIME: 202401171308
Unit Type and Rh: 5100
Unit Type and Rh: 5100

## 2022-02-25 LAB — RENAL FUNCTION PANEL
Albumin: 2.1 g/dL — ABNORMAL LOW (ref 3.5–5.0)
Anion gap: 14 (ref 5–15)
BUN: 111 mg/dL — ABNORMAL HIGH (ref 8–23)
CO2: 14 mmol/L — ABNORMAL LOW (ref 22–32)
Calcium: 6.9 mg/dL — ABNORMAL LOW (ref 8.9–10.3)
Chloride: 101 mmol/L (ref 98–111)
Creatinine, Ser: 5.09 mg/dL — ABNORMAL HIGH (ref 0.61–1.24)
GFR, Estimated: 11 mL/min — ABNORMAL LOW (ref >60)
Glucose, Bld: 156 mg/dL — ABNORMAL HIGH (ref 70–99)
Phosphorus: 6.7 mg/dL — ABNORMAL HIGH (ref 2.5–4.6)
Potassium: 4.7 mmol/L (ref 3.5–5.1)
Sodium: 129 mmol/L — ABNORMAL LOW (ref 135–145)

## 2022-02-25 LAB — BLOOD PRODUCT ORDER (VERBAL) VERIFICATION

## 2022-02-25 MED ORDER — GLYCOPYRROLATE 1 MG PO TABS: 1.0000 mg | ORAL_TABLET | ORAL | Status: DC | PRN

## 2022-02-25 MED ORDER — MORPHINE BOLUS VIA INFUSION: 1.0000 mg | INTRAVENOUS | Status: DC | PRN

## 2022-02-25 MED ORDER — ONDANSETRON HCL 4 MG/2ML IJ SOLN
4.0000 mg | INTRAMUSCULAR | Status: DC
  Administered 2022-02-25 (×2): 4 mg via INTRAVENOUS
  Filled 2022-02-25 (×2): qty 2

## 2022-02-25 MED ORDER — ONDANSETRON HCL 4 MG/2ML IJ SOLN
4.0000 mg | INTRAMUSCULAR | Status: DC | PRN
  Administered 2022-02-25: 4 mg via INTRAVENOUS
  Filled 2022-02-25: qty 2

## 2022-02-25 MED ORDER — GLYCOPYRROLATE 0.2 MG/ML IJ SOLN
0.2000 mg | INTRAMUSCULAR | Status: DC | PRN
  Filled 2022-02-25: qty 1

## 2022-02-25 MED ORDER — GLYCOPYRROLATE 0.2 MG/ML IJ SOLN: 0.2000 mg | INTRAMUSCULAR | Status: DC | PRN

## 2022-02-25 MED ORDER — MORPHINE 100MG IN NS 100ML (1MG/ML) PREMIX INFUSION
1.0000 mg/h | INTRAVENOUS | Status: DC
  Administered 2022-02-25: 1 mg/h via INTRAVENOUS
  Filled 2022-02-25 (×2): qty 100

## 2022-02-25 MED ORDER — POLYVINYL ALCOHOL 1.4 % OP SOLN: 1.0000 [drp] | Freq: Four times a day (QID) | OPHTHALMIC | Status: DC | PRN

## 2022-02-25 MED ORDER — MORPHINE BOLUS VIA INFUSION
1.0000 mg | INTRAVENOUS | Status: DC | PRN
  Administered 2022-02-25 – 2022-02-26 (×6): 1 mg via INTRAVENOUS

## 2022-02-25 MED ORDER — ONDANSETRON HCL 4 MG PO TABS: 4.0000 mg | ORAL_TABLET | ORAL | Status: DC | PRN

## 2022-02-25 MED ORDER — ACETAMINOPHEN 650 MG RE SUPP: 650.0000 mg | Freq: Four times a day (QID) | RECTAL | Status: DC | PRN

## 2022-02-25 MED ORDER — BIOTENE DRY MOUTH MT LIQD: 15.0000 mL | OROMUCOSAL | Status: DC | PRN

## 2022-02-25 MED ORDER — SODIUM CHLORIDE 0.9 % IV SOLN
12.5000 mg | Freq: Four times a day (QID) | INTRAVENOUS | Status: DC | PRN
  Administered 2022-02-25: 12.5 mg via INTRAVENOUS
  Filled 2022-02-25: qty 0.5

## 2022-02-25 MED ORDER — ACETAMINOPHEN 325 MG PO TABS: 650.0000 mg | ORAL_TABLET | Freq: Four times a day (QID) | ORAL | Status: DC | PRN

## 2022-02-25 MED ORDER — LORAZEPAM 2 MG/ML IJ SOLN
1.0000 mg | INTRAMUSCULAR | Status: DC | PRN
  Administered 2022-02-26: 1 mg via INTRAVENOUS
  Filled 2022-02-25: qty 1

## 2022-02-25 MED ORDER — MORPHINE 100MG IN NS 100ML (1MG/ML) PREMIX INFUSION: 1.0000 mg/h | INTRAVENOUS | Status: DC

## 2022-02-25 NOTE — Progress Notes (Signed)
Progress Note   Subjective  Patient has passed some more blood per rectum but Hgb stable. WBC rising. He is having ongoing abdominal pain. Patient's family has met, they did not want to pursue CTA and did not think he could tolerate a colonoscopy, he was made comfort care.   Objective   Vital signs in last 24 hours: Temp:  [97.2 F (36.2 C)-99.6 F (37.6 C)] 98.7 F (37.1 C) (01/18 0759) Pulse Rate:  [77-97] 88 (01/18 0759) Resp:  [20-28] 21 (01/18 0759) BP: (91-130)/(32-102) 106/34 (01/18 0759) SpO2:  [95 %-100 %] 100 % (01/18 0759) Last BM Date : 02/25/22 General:    white male in NAD sleeping, sedated  Intake/Output from previous day: 01/17 0701 - 01/18 0700 In: 2070 [P.O.:480; I.V.:758; Blood:732; IV Piggyback:100] Out: 700 [Urine:700] Intake/Output this shift: No intake/output data recorded.  Lab Results: Recent Labs    02/23/22 0617 02/23/22 1111 02/23/22 2109 02/21/2022 0111 02/25/22 0042  WBC 4.5  --   --  4.0 22.8*  HGB 7.9*   < > 9.4* 9.7* 11.5*  HCT 25.4*   < > 27.9* 29.1* 33.9*  PLT 95*  --   --  98* 86*   < > = values in this interval not displayed.   BMET Recent Labs    02/23/22 0617 02/23/2022 0111 02/25/22 0042  NA 128* 128* 129*  K 5.1 5.1 4.7  CL 99 100 101  CO2 17* 15* 14*  GLUCOSE 106* 112* 156*  BUN 112* 106* 111*  CREATININE 4.95* 4.44* 5.09*  CALCIUM 7.0* 7.3* 6.9*   LFT Recent Labs    02/19/2022 0111 02/25/22 0042  PROT 4.7*  --   ALBUMIN 2.5* 2.1*  AST 20  --   ALT 18  --   ALKPHOS 64  --   BILITOT 1.5*  --    PT/INR Recent Labs    03/06/2022 1231 02/14/2022 1102  LABPROT 24.9* 19.5*  INR 2.3* 1.7*    Studies/Results: No results found.     Assessment / Plan:    77 y/o male with multiple medical problems admitted with GI bleed in the setting of anticoagulation.  EGD negative for source of bleeding however large polypoid growth noted in the bowel, likely adenomatous but biopsies were obtained.  He has had further  bleeding that got worse post procedure yesterday.  We had discussed with the family that neck step in evaluating that would be with a CTA, however risks of that include contrast nephropathy and could lead to dialysis which the patient was adamant he did not want.  He otherwise did not appear to be able to drink prep for colonoscopy to further evaluate his bleeding.  After discussion with the family and primary team, they elected to make him comfort care. Renal function worsening, he has a significant leukocytosis.  We discussed his course for a bit today.  They do not want to pursue colonoscopy or further aggressive measures.  If they want to know what was causing his pain and leukocytosis, could consider noncontrast CT scan, however they would not want any measures done to address this so we will hold off on that for now.  Possible he has diverticulitis with bleeding, hard to say.  Available if needed to assist, please call us with any questions.  Thanks  Jolly Mango, MD [facility]

## 2022-02-25 NOTE — Care Management Important Message (Signed)
Important Message  Patient Details  Name: Daniel Myers MRN: [id number] Date of Birth: May 06, 1945   Medicare Important Message Given:  Yes     Shelda Altes 02/25/2022, 10:09 AM

## 2022-02-25 NOTE — Progress Notes (Signed)
PROGRESS NOTE  Daniel Myers  DOB: [birth year]  PCP: Claretta Fraise, MD BTD:[id number]  DOA: 03/03/2022  LOS: 3 days  Hospital Day: 4  Brief narrative: Daniel Myers is a 77 y.o. 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  In the ED, patient was noted to have a hemoglobin low at 5.5, creatinine elevated to 4.93 (up from 2.69 two weeks ago), INR 2.3, sodium 123 2 units of PRBC transfusion given Admitted to [facility] and GI consulted Per recommendation, patient was transferred to [facility] on 1/16.  Early this afternoon, patient had EGD, noted to have a large polypoid lesion in the duodenum likely nonmalignant.  On table, he started to have burgundy colored blood per rectum suggestive of a lower GI bleeding.  Per GI, he was not in a situation to be prepped for colonoscopy because of altered mental status and inability to take prep. CT angiogram with subsequent IR embolization could not be considered because of his poor renal function and high risk of dialysis requirement after that.  In the past, patient has repeatedly refused to be on dialysis.  After discussion of this risk with patient and family, they chose to avoid further aggressive care.  They were agreeable to blood transfusion and hence 2 units of PRBCs were transfused.  After the transfusion was over, family chose to make patient comfort care only. I stopped his regular medicines.  Started him on morphine Comfort care order sets initiated  Subjective: Patient was seen and examined this morning.  Overnight, patient had 2 episodes of being bloody bowel movements. Lying down in bed.  Awake.  States he is having abdominal pain.  Patient's  daughter Ms. Cecille Rubin was at bedside.  I discussed with her about upgrading from morphine pushes to morphine drip.  She is agreeable.  Order placed.  Communicated to RN  Assessment and plan: Comfort care status Initially admitted for GI bleeding.  Please see above for details. After discussion with family, currently comfort care status is 1/17 Continue symptoms focused treatment with morphine drip, as needed IV Ativan, as needed IV Zofran In-hospital mortality most likely.  Other issues currently not being addressed Acute GI bleeding Acute on chronic blood loss anemia Recent UTI AKI on CKD stage IV/renal transplant Chronic combined CHF History of hypertension Hypervolemic hyponatremia Hyperkalemia  hypermagnesemia Type 2 diabetes mellitus Severe multivessel CAD Paroxysmal atrial fibrillation Thrombocytopenia Anxiety   Goals of care   Code Status: DNR  Comfort Care status  Scheduled Meds:  sodium chloride   Intravenous Once   ondansetron (ZOFRAN) IV  4 mg Intravenous Q4H   sodium chloride flush  3 mL Intravenous Q12H    PRN meds: sodium chloride, acetaminophen **OR** acetaminophen, albuterol, haloperidol **OR** haloperidol **OR** haloperidol lactate, sodium chloride flush   Infusions:   sodium chloride Stopped (02/23/2022 1350)   morphine      Antimicrobials: Anti-infectives (From admission, onward)    Start     Dose/Rate Route Frequency Ordered Stop   03/06/2022 1000  cefTRIAXone (ROCEPHIN) 1 g in sodium chloride 0.9 % 100 mL IVPB  Status:  Discontinued        1 g 200 mL/hr over 30 Minutes Intravenous Every 24 hours 02/12/2022  0922 02/25/22 0851       Skin assessment:      Diet:  Diet Order             Diet NPO time specified Except for: Ice Chips  Diet effective now                   Nutritional status: Body mass index is 29.89 kg/m.           Status is: Inpatient Level of care: Progressive  Continue in-hospital care because: Comfort care  status  Objective: Vitals:   02/13/2022 2050 02/25/22 0759  BP: (!) 101/32 (!) 106/34  Pulse: 94 88  Resp: (!) 23 (!) 21  Temp: 99.6 F (37.6 C) 98.7 F (37.1 C)  SpO2: 100% 100%    Intake/Output Summary (Last 24 hours) at 02/25/2022 1044 Last data filed at 02/25/2022 0527 Gross per 24 hour  Intake 1950.04 ml  Output 700 ml  Net 1250.04 ml   Filed Weights   03/02/2022 1009 02/23/22 1808 02/25/2022 0408  Weight: 89.4 kg 94 kg 94.5 kg   Weight change:  Body mass index is 29.89 kg/m.   Physical Exam this morning: General exam: Pleasant, complains of mild abdominal discomfort. I did not do a detailed examination  Data Review: I have personally reviewed the laboratory data and studies available.  F/u labs ordered [employer] (From admission, onward)     Start     Ordered   Pending  Type and screen [facility]  Once,   STAT       Comments: [facility]    Pending            Total time spent in review of labs and imaging, patient evaluation, formulation of plan, documentation and communication with family:  55 minutes  Signed, Terrilee Croak, MD [facility] 02/25/2022

## 2022-02-25 NOTE — Progress Notes (Signed)
Patient ID: Daniel Myers, male   DOB: [birth year], 77 y.o.   MRN: [id number] S: Pt developed hypotension yesterday after EGD with SOB and increased somnolence.  Seen by PCCM but did not need ICU transfer.  His family is now transitioning him to comfort care. O:BP (!) 106/34 (BP Location: Left Arm)   Pulse 88   Temp 98.7 F (37.1 C) (Oral)   Resp (!) 21   Ht '5\' 10"'$  (1.778 m)   Wt 94.5 kg   SpO2 100%   BMI 29.89 kg/m   Intake/Output Summary (Last 24 hours) at 02/25/2022 0933 Last data filed at 02/25/2022 0527 Gross per 24 hour  Intake 1950.04 ml  Output 700 ml  Net 1250.04 ml   Intake/Output: I/O last 3 completed shifts: In: 2550 [P.O.:960; I.V.:758; Blood:732; IV Piggyback:100] Out: 1150 [LGXQJ:1941]  Intake/Output this shift:  No intake/output data recorded. Weight change:  Gen: Ill-appearing  CVS:RRR  Resp:CTA Abd: +BS, soft, NT/ND Ext: no edema  Recent Labs  Lab 02/23/2022 1040 02/23/22 0617 03/03/2022 0111 02/25/22 0042  NA 123* 128* 128* 129*  K 5.4* 5.1 5.1 4.7  CL 95* 99 100 101  CO2 13* 17* 15* 14*  GLUCOSE 210* 106* 112* 156*  BUN 112* 112* 106* 111*  CREATININE 4.93* 4.95* 4.44* 5.09*  ALBUMIN 2.6* 2.5* 2.5* 2.1*  CALCIUM 6.9* 7.0* 7.3* 6.9*  PHOS  --   --   --  6.7*  AST '22 20 20  '$ --   ALT '16 17 18  '$ --    Liver Function Tests: Recent Labs  Lab 02/16/2022 1040 02/23/22 0617 02/12/2022 0111 02/25/22 0042  AST '22 20 20  '$ --   ALT '16 17 18  '$ --   ALKPHOS 55 56 64  --   BILITOT 0.8 1.2 1.5*  --   PROT 4.8* 4.7* 4.7*  --   ALBUMIN 2.6* 2.5* 2.5* 2.1*   Recent Labs  Lab 02/10/2022 1040  LIPASE 22   No results for input(s): "AMMONIA" in the last 168 hours. CBC: Recent Labs  Lab 03/05/2022 1040 02/23/22 0617 02/23/22 1111 02/23/22 2109 02/16/2022 0111 02/25/22 0042  WBC 3.7* 4.5  --   --  4.0 22.8*  NEUTROABS 2.5  --   --   --   --   --   HGB 5.5* 7.9*   < > 9.4* 9.7* 11.5*  HCT 18.1* 25.4*   < > 27.9* 29.1* 33.9*  MCV 92.3 93.4  --   --  87.9 86.3   PLT 99* 95*  --   --  98* 86*   < > = values in this interval not displayed.   Cardiac Enzymes: No results for input(s): "CKTOTAL", "CKMB", "CKMBINDEX", "TROPONINI" in the last 168 hours. CBG: Recent Labs  Lab 02/08/2022 0409 02/14/2022 0811 02/28/2022 1054 03/08/2022 1208 03/07/2022 1537  GLUCAP 96 114* 158* 159* 165*    Iron Studies: No results for input(s): "IRON", "TIBC", "TRANSFERRIN", "FERRITIN" in the last 72 hours. Studies/Results: No results found.  sodium chloride   Intravenous Once   sodium chloride flush  3 mL Intravenous Q12H    BMET    Component Value Date/Time   NA 129 (L) 02/25/2022 0042   NA 139 02/10/2022 1154   K 4.7 02/25/2022 0042   CL 101 02/25/2022 0042   CO2 14 (L) 02/25/2022 0042   GLUCOSE 156 (H) 02/25/2022 0042   BUN 111 (H) 02/25/2022 0042   BUN 63 (H) 02/10/2022 1154   CREATININE 5.09 (H)  02/25/2022 0042   CALCIUM 6.9 (L) 02/25/2022 0042   GFRNONAA 11 (L) 02/25/2022 0042   GFRAA 49 (L) 04/03/2020 1027   CBC    Component Value Date/Time   WBC 22.8 (H) 02/25/2022 0042   RBC 3.93 (L) 02/25/2022 0042   HGB 11.5 (L) 02/25/2022 0042   HGB 9.0 (L) 02/10/2022 1154   HCT 33.9 (L) 02/25/2022 0042   HCT 29.2 (L) 02/10/2022 1154   PLT 86 (L) 02/25/2022 0042   PLT 71 (LL) 02/10/2022 1154   MCV 86.3 02/25/2022 0042   MCV 89 02/10/2022 1154   MCH 29.3 02/25/2022 0042   MCHC 33.9 02/25/2022 0042   RDW 17.3 (H) 02/25/2022 0042   RDW 15.0 02/10/2022 1154   LYMPHSABS 0.6 (L) 02/21/2022 1040   LYMPHSABS 0.6 (L) 02/10/2022 1154   MONOABS 0.3 02/20/2022 1040   EOSABS 0.1 02/23/2022 1040   EOSABS 0.0 02/10/2022 1154   BASOSABS 0.0 02/17/2022 1040   BASOSABS 0.0 02/10/2022 1154      Assessment/Plan:   AKI/CKD stage IV, non-oliguric - 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  Baseline Scr 2-3 followed by Dr. Moshe Cipro in our office.  Presented with AKI/CKD stage IV  in setting of hypotension and ABLA with bloody diarrhea.  He also had pyuria.  Kidney imaging without hydronephrosis.  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  Scr peaked at 4.95 but improved to 4.44 today after blood transfusion and improved bp.  He again confirms that he would not want to resume HD and has spoken with Palliative care and understands the consequences.  His bump in BUN/Cr likely due to hypotension yesterday.  Now moving to comfort care.  Will sign off for now.  Please call with questions or concerns.  Avoid nephrotoxic medications including NSAIDs and iodinated intravenous contrast exposure unless the latter is absolutely indicated.   Preferred narcotic agents for pain control are hydromorphone, fentanyl, and methadone. Morphine should not be used.  Avoid Baclofen and avoid oral sodium phosphate and magnesium citrate based laxatives / bowel preps.  Continue strict Input and Output monitoring.  Will monitor the patient closely with you and intervene or adjust therapy as indicated by changes in clinical status/labs   ABLA - with bloody diarrhea.  S/p blood transfusions.  Transferred to [facility] for EGD.  Transfuse prn.  Hgb improved to 9.7. H/o DDKT- continue with home doses of prograf, cellcept, and prednisone. Pyuria - without bacteria, possible AIN from recent antibiotics.  Urine culture 40,000 CFU with E. Coli sensitive to everything except PCN.  Currently on ceftriaxone. Hyponatremia - improving somewhat with blood.  Edema has improved. HTN/Volume - bp improved and holding BB and diuretics for now. Thrombocytopenia - stable and chronic CHF, chronic combined - s/p blood transfusion and lasix.  Volume improved. CAD - severe multivessel.  Pt declined CABG. P. Atrial fib - off anticoagulation due to GIB  Disposition - palliative care following.  Now moving to comfort care.  Donetta Potts, MD [facility]

## 2022-02-25 NOTE — Consult Note (Signed)
Consultation Note Date: 02/25/2022 at 1027  Patient Name: Daniel Myers  DOB: [birth year]  MRN: [id number]  Age / Sex: 77 y.o., male  PCP: Claretta Fraise, MD Referring Physician: Terrilee Croak, MD  Reason for Consultation: Establishing goals of care  HPI/Patient Profile: 77 y.o. 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  1/17 patient had EGD that was negative for source of bleeding.  However, patient had large polypoid growth in the bowel (likely addend tests (and biopsies were obtained.  Patient had continued bleeding postprocedure.  CTA has not been recommended due to contrast nephropathy and chance of needing dialysis in the future.  Patient was clear that he does not want dialysis.  After discussion with family and primary team, patient and family have elected to shift to full comfort measures.  PMT was consulted to assist with symptom management in light of comfort care.  Clinical Assessment and Goals of Care: I have reviewed medical records including EPIC notes, labs and imaging, assessed the patient and then met with patient and his two daughters at bedside to discuss diagnosis prognosis, GOC, EOL wishes, disposition and options.  I introduced Palliative Medicine as specialized medical care for people living with serious illness. It focuses on providing relief from the symptoms and stress of a serious illness. The goal is to improve quality of life for both the patient and the family.  We discussed a brief life review of the patient. Patient worked for [employer] and has two daughters, Vivien Rota and Cecille Rubin. He is a widow and been living independently with his daughter (who lives across the street) checking on in/caring for him  daily.   As far as functional and nutritional status daughter shares patient has been declining in functional status over the past year. She endorses that in the past 2 weeks PTA he has been having poor appetite and lethargic.  We discussed patient's current illness and what it means in the larger context of patient's on-going co-morbidities.  Natural disease trajectory discussed. Quality of life and compassionate, dignified EOL care discussed.   I attempted to elicit values and goals of care important to the patient.  He is unable to participate in discussions today but both daughters endorse that he does not want to continue with medical treatments and wants to be made comfortable.  Full comfort measures discussed. Family in agreement. Orders adjusted. Recommendations are as follows:  -Avoid high doses of Morphine in ESRD d/t potential for neurotoxicity -Unrestricted visitor access -Aggressive n/v treatment - 1st line) Zofran 2nd) Phenergan 3rd) Haldol  -Ativan for anxiety -Discontinue medications and treatments no solely focused on comfort  Symptoms assessed. Family shares patient is calm and relaxed when sleeping, no signs of pain or discomfort at rest. However, he has fits where he wakes up, wants to drink something, takes a sip, and feels sick or vomits. We  discussed using swabs to wet patient's mouth but avoid full/large sips. Reviewed stepwise treatment of nausea with zofran, phenergan, and haldol.   Questions and concerns were addressed. The family was encouraged to call with questions or concerns. PMT will continue to follow patient and support he and family during this time.  Primary Decision Maker NEXT OF KIN  Physical Exam Vitals reviewed.  HENT:     Head: Normocephalic.     Mouth/Throat:     Mouth: Mucous membranes are moist.  Cardiovascular:     Pulses: Normal pulses.  Pulmonary:     Effort: Pulmonary effort is normal.  Abdominal:     Palpations: Abdomen is soft.   Musculoskeletal:     Comments: Generalized weakness  Skin:    General: Skin is warm.     Coloration: Skin is pale.     Palliative Assessment/Data: 10-20%     Thank you for this consult. Palliative medicine will continue to follow and assist holistically.   Time Total: 75 minutes Greater than 50%  of this time was spent counseling and coordinating care related to the above assessment and plan.  Signed by: Jordan Hawks, DNP, FNP-BC Palliative Medicine    Please contact Palliative Medicine Team phone at [phone] for questions and concerns.  For individual provider: See Shea Evans

## 2022-02-26 DIAGNOSIS — D62 Acute posthemorrhagic anemia: Secondary | ICD-10-CM | POA: Diagnosis not present

## 2022-02-26 DIAGNOSIS — Z515 Encounter for palliative care: Secondary | ICD-10-CM | POA: Diagnosis not present

## 2022-02-27 ENCOUNTER — Encounter (HOSPITAL_COMMUNITY): Payer: Self-pay | Admitting: Gastroenterology

## 2022-02-28 LAB — TYPE AND SCREEN
ABO/RH(D): A POS
Antibody Screen: NEGATIVE
Unit division: 0
Unit division: 0
Unit division: 0
Unit division: 0

## 2022-02-28 LAB — BPAM RBC
Blood Product Expiration Date: 202402032359
Blood Product Expiration Date: 202402122359
Blood Product Expiration Date: 202402132359
Blood Product Expiration Date: 202402132359
ISSUE DATE / TIME: 202401152040
ISSUE DATE / TIME: 202401160031
ISSUE DATE / TIME: 202401161318
ISSUE DATE / TIME: 202401161559
Unit Type and Rh: 6200
Unit Type and Rh: 6200
Unit Type and Rh: 6200
Unit Type and Rh: 6200

## 2022-03-01 ENCOUNTER — Encounter: Payer: Medicare Other | Admitting: *Deleted

## 2022-03-02 ENCOUNTER — Encounter (HOSPITAL_COMMUNITY): Payer: Medicare Other

## 2022-03-02 LAB — SURGICAL PATHOLOGY

## 2022-03-11 NOTE — Progress Notes (Signed)
PROGRESS NOTE  Daniel Myers  DOB: [birth year]  PCP: Claretta Fraise, MD VZD:[id number]  DOA: 02/23/2022  LOS: 4 days  Hospital Day: 5  Brief narrative: Daniel Myers is a 77 y.o. male with PMH significant for renal transplant 2008, DM2, HTN, HLD, severe multivessel CAD, A-fib on Eliquis, chronic combined CHF with LVEF 20% 1/15, patient presented to the ED at [facility] with complaint of nausea, vomiting, bloody diarrhea and progressive weakness for few days that started after taking antibiotics for UTI a week ago.  In the ED, patient was noted to have a hemoglobin low at 5.5, creatinine elevated to 4.93 (up from 2.69 two weeks ago), INR 2.3, sodium 123 2 units of PRBC transfusion given Admitted to [facility] Nephrology and GI consulted Per recommendation, patient was transferred to [facility] on 1/16.  Early this afternoon, patient had EGD, noted to have a large polypoid lesion in the duodenum likely nonmalignant.  On table, he started to have burgundy colored blood per rectum suggestive of a lower GI bleeding.  Per GI, he was not in a situation to be prepped for colonoscopy because of altered mental status and inability to take prep. CT angiogram with subsequent IR embolization could not be considered because of his poor renal function and high risk of dialysis requirement after that.  In the past, patient has repeatedly refused to be on dialysis.  After discussion of this risk with patient and family, they chose to avoid further aggressive care.  They were agreeable to blood transfusion and hence 2 units of PRBCs were transfused.  After the transfusion was over, family chose to make patient comfort care only. I stopped his regular medicines.  Started him on morphine Comfort care order sets initiated  Subjective: Patient was seen and examined this morning.  Remains comfortable.  On morphine drip.  Daughter at bedside.   Assessment and plan: Comfort care status Initially admitted for GI  bleeding.  Please see above for details. After discussion with family, currently comfort care status is 1/17 Continue symptoms focused treatment with morphine drip, as needed IV Ativan, as needed IV Zofran In-hospital mortality most likely.  Other issues currently not being addressed Acute GI bleeding Acute on chronic blood loss anemia Recent UTI AKI on CKD stage IV/renal transplant Chronic combined CHF History of hypertension Hypervolemic hyponatremia Hyperkalemia  hypermagnesemia Type 2 diabetes mellitus Severe multivessel CAD Paroxysmal atrial fibrillation Thrombocytopenia Anxiety   Goals of care   Code Status: DNR  Comfort Care status  Scheduled Meds:  ondansetron (ZOFRAN) IV  4 mg Intravenous Q4H    PRN meds: acetaminophen **OR** acetaminophen, antiseptic oral rinse, glycopyrrolate **OR** glycopyrrolate **OR** glycopyrrolate, haloperidol **OR** haloperidol **OR** haloperidol lactate, LORazepam, morphine, polyvinyl alcohol, promethazine (PHENERGAN) injection (IM or IVPB)   Infusions:   morphine 4 mg/hr (2022-03-17 0208)   promethazine (PHENERGAN) injection (IM or IVPB) Stopped (02/25/22 1453)    Antimicrobials: Anti-infectives (From admission, onward)    Start     Dose/Rate Route Frequency Ordered Stop   02/13/2022 1000  cefTRIAXone (ROCEPHIN) 1 g in sodium chloride 0.9 % 100 mL IVPB  Status:  Discontinued        1 g 200 mL/hr over 30 Minutes Intravenous Every 24 hours 02/11/2022 0922 02/25/22 0851       Skin assessment:      Diet:  Diet Order             Diet NPO time specified Except for: Ice Chips  Diet effective now  Nutritional status: Body mass index is 29.89 kg/m.           Status is: Inpatient Level of care: Med-Surg  Continue in-hospital care because: Comfort care status  Objective: Vitals:   02/25/22 0759 02/25/22 2024  BP: (!) 106/34 (!) 90/48  Pulse: 88   Resp: (!) 21 18  Temp: 98.7 F (37.1 C) 98.7 F  (37.1 C)  SpO2: 100%     Intake/Output Summary (Last 24 hours) at 11-Mar-2022 1323 Last data filed at 03/11/22 0003 Gross per 24 hour  Intake 63.46 ml  Output --  Net 63.46 ml    Filed Weights   02/09/2022 1009 02/23/22 1808 02/21/2022 0408  Weight: 89.4 kg 94 kg 94.5 kg   Weight change:  Body mass index is 29.89 kg/m.   Physical Exam this morning: General exam: Pleasant, complains of mild abdominal discomfort. I did not do a detailed examination  Data Review: I have personally reviewed the laboratory data and studies available.  F/u labs ordered Unresulted Labs (From admission, onward)    None       Total time spent in review of labs and imaging, patient evaluation, formulation of plan, documentation and communication with family:  25 minutes  Signed, Terrilee Croak, MD [facility] 03/11/22

## 2022-03-11 NOTE — Progress Notes (Signed)
   Palliative Medicine Inpatient Follow Up Note HPI: 77 y.o. 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   1/17 patient had EGD that was negative for source of bleeding.  However, patient had large polypoid growth in the bowel (likely addend tests (and biopsies were obtained.  Patient had continued bleeding postprocedure.  CTA has not been recommended due to contrast nephropathy and chance of needing dialysis in the future.  Patient was clear that he does not want dialysis.  After discussion with family and primary team, patient and family have elected to shift to full comfort measures.   PMT was consulted to assist with symptom management in light of comfort care.  Today's Discussion 03-17-22  *Please note that this is a verbal dictation therefore any spelling or grammatical errors are due to the "[employer] One" system interpretation.  Chart reviewed inclusive of vital signs, progress notes, laboratory results, and diagnostic images.   Reviewed morphine gtt rate.   I met with Daniel Myers at bedside to complete a symptom assessment. He was comfortable and noted to be in NAD. Extremities cool and mottling. Breaths shallow and limited to 6-8 breaths/minute.   I met with patients daughter. Created space and opportunity for her to explore thoughts feelings and fears regarding current medical situation.Offered support through therapeutic listening.   Provided swabs for patients mouth.  Questions and concerns addressed/Palliative Support Provided.   Objective Assessment: Vital Signs Vitals:   02/25/22 0759 02/25/22 2024  BP: (!) 106/34 (!) 90/48  Pulse: 88   Resp: (!) 21 18  Temp: 98.7 F (37.1 C) 98.7 F (37.1 C)  SpO2: 100%     Intake/Output Summary (Last 24 hours) at 17-Mar-2022 1143 Last data filed at  03-17-2022 0003 Gross per 24 hour  Intake 63.46 ml  Output --  Net 63.46 ml   Last Weight  Most recent update: 03/08/2022  4:11 AM    Weight  94.5 kg (208 lb 5.4 oz)            Gen:  Elderly Caucasian M in NAD HEENT: dry mucous membranes CV: Regular rate and rhythm  PULM:On RA ABD: soft/nontender  EXT: Generalized edema  Neuro: Somnolent  SUMMARY OF RECOMMENDATIONS   DNAR/DNI  Continue gtt at present rate --> no present s/s of neurotoxicity  Additional comfort medications per [facility]  Unrestricted visitation  Ongoing support  Anticipate in hospital passing  Billing based on MDM: High  ______________________________________________________________________________________ [place] Team Team Cell Phone: [phone] Please utilize secure chat with additional questions, if there is no response within 30 minutes please call the above phone number  Palliative Medicine Team providers are available by phone from 7am to 7pm daily and can be reached through the team cell phone.  Should this patient require assistance outside of these hours, please call the patient's attending physician.

## 2022-03-11 NOTE — Death Summary Note (Signed)
DEATH SUMMARY   Patient Details  Name: Daniel Myers MRN: [id number] DOB: March 18, 1945 Myers, Daniel Gash, MD Admission/Discharge Information   Admit Date:  03/23/2022  Date of Death: Date of Death: Mar 27, 2022  Time of Death: Time of Death: 1419-05-20  Length of Stay: [place] [facility] Diagnoses: Principal Problem:   Acute blood loss anemia Active Problems:   CAD (coronary artery disease)/Prior MI   Insulin dependent type 2 diabetes mellitus ([place])   Renal transplant recipient   Essential hypertension   Hyperlipidemia   Hyponatremia   Acute on chronic combined systolic and diastolic CHF (congestive heart failure) (HCC)   Thrombocytopenia ([place])   Cerebrovascular accident (CVA) ([place])   Immunosuppression ([place])   Goals of care, counseling/discussion   DNR (do not resuscitate)   Paroxysmal A-fib ([place])   Anemia in stage 4 chronic kidney disease ([place])   Gastrointestinal hemorrhage   Anticoagulated   Adenomatous duodenal polyp   Myers Course: Daniel Myers was a 77 y.o. male with PMH significant for renal transplant 05-20-2006, DM2, HTN, HLD, severe multivessel CAD, A-fib on Eliquis, chronic combined CHF with LVEF 20% 03-23-22, patient presented to the ED at [facility] with complaint of nausea, vomiting, bloody diarrhea and progressive weakness for few days that started after taking antibiotics for UTI a week ago.   In the ED, patient was noted to have a hemoglobin low at 5.5, creatinine elevated to 4.93 (up from 2.69 two weeks ago), INR 2.3, sodium 123 2 units of PRBC transfusion given Admitted to [facility] Nephrology and GI consulted Per recommendation, patient was transferred to [facility] on 1/16.   1/17, patient had EGD, noted to have a large polypoid lesion in the duodenum likely nonmalignant.  On table, he started to have burgundy colored blood per rectum suggestive of a lower GI bleeding.  Per GI, he was not in a situation to be prepped for colonoscopy because of altered mental status and  inability to take prep. CT angiogram with subsequent IR embolization could not be considered because of his poor renal function and high risk of dialysis requirement after that.  In the past, patient has repeatedly refused to be on dialysis.  After discussion of this risk with patient and family, they chose to avoid further aggressive care.  They were agreeable to blood transfusion and hence 2 units of PRBCs were transfused.  After the transfusion was over, family chose to make patient comfort care only. I stopped his regular medicines.  Started him on morphine Comfort care order sets initiated  Patient expired on 03-27-22 at 14:21 pm. Cause of death: Hemorrhagic shock secondary to GI bleeding secondary to diverticulosis      Procedures: EGD as above  Consultations: GI  The results of significant diagnostics from this hospitalization (including imaging, microbiology, ancillary and laboratory) are listed below for reference.   Significant Diagnostic Studies: No results found.  Microbiology: Recent Results (from the past 240 hour(s))  Urine Culture     Status: Abnormal   Collection Time: 23-Mar-2022  3:00 PM   Specimen: Urine, Clean Catch  Result Value Ref Range Status   Specimen Description   Final    URINE, CLEAN CATCH Performed at [facility], [address] [place], [place] [address]    Special Requests   Final    NONE Performed at [facility], [address], [place], [place] [address]    Culture 40,000 COLONIES/mL [place] (A)  Final   Report Status 02/15/2022 FINAL  Final  Organism ID, Bacteria ESCHERICHIA COLI (A)  Final      Susceptibility   Escherichia coli - MIC*    AMPICILLIN >=32 RESISTANT Resistant     CEFAZOLIN <=4 SENSITIVE Sensitive     CEFEPIME <=0.12 SENSITIVE Sensitive     CEFTRIAXONE <=0.25 SENSITIVE Sensitive     CIPROFLOXACIN <=0.25 SENSITIVE Sensitive     GENTAMICIN <=1 SENSITIVE Sensitive     IMIPENEM <=0.25 SENSITIVE Sensitive      NITROFURANTOIN <=16 SENSITIVE Sensitive     TRIMETH/SULFA <=20 SENSITIVE Sensitive     AMPICILLIN/SULBACTAM 16 INTERMEDIATE Intermediate     PIP/TAZO <=4 SENSITIVE Sensitive     * 40,000 COLONIES/mL ESCHERICHIA COLI  MRSA Next Gen by PCR, Nasal     Status: Abnormal   Collection Time: 02/23/22  1:02 AM   Specimen: Nasal Mucosa; Nasal Swab  Result Value Ref Range Status   MRSA by PCR Next Gen DETECTED (A) NOT DETECTED Final    Comment: RESULT CALLED TO, READ BACK BY AND VERIFIED WITH: M.MOSTELLER AT 0231 ON 01.16.24 BY ADGER J         The GeneXpert MRSA Assay (FDA approved for NASAL specimens only), is one component of a comprehensive MRSA colonization surveillance program. It is not intended to diagnose MRSA infection nor to guide or monitor treatment for MRSA infections. Performed at [facility], [address], [place], [place] [address]     Time spent: 35 minutes  Signed: Terrilee Croak, MD 2022/03/07

## 2022-03-11 DEATH — deceased

## 2022-03-15 ENCOUNTER — Ambulatory Visit: Payer: Medicare Other | Admitting: Family Medicine

## 2022-03-16 ENCOUNTER — Ambulatory Visit: Payer: Self-pay | Admitting: *Deleted

## 2022-03-16 NOTE — Patient Outreach (Addendum)
   Care Coordination    Case closure Visit Note   03/16/22 Name: Daniel Myers  MRN: [id number]       DOB: [birth year]   Daniel Myers is a 76 y.o. year old male  Patient deceased. Date of death: 2022-03-20 as notified by [facility] CMA on 03/15/22  Interventions Today    Flowsheet Row Most Recent Value  Advanced Directive Interventions   Advanced Directives Discussed/Reviewed End of Life  End of Life --  [case closure for March 20, 2022 decease patient]      Case closure  Daniel Myers L. Lavina Hamman, RN, BSN, [place] Coordinator Office number [phone]

## 2022-07-21 ENCOUNTER — Encounter (HOSPITAL_COMMUNITY): Payer: Medicare Other | Admitting: Cardiology
